# Patient Record
Sex: Male | Born: 1965 | Race: White | Hispanic: No | Marital: Single | Smoking: Former smoker
Health system: Southern US, Community
[De-identification: ages and names within clinical notes are randomized; demographics above are authoritative.]

## PROBLEM LIST (undated history)

## (undated) DIAGNOSIS — I2510 Atherosclerotic heart disease of native coronary artery without angina pectoris: Secondary | ICD-10-CM

## (undated) DIAGNOSIS — I10 Essential (primary) hypertension: Secondary | ICD-10-CM

## (undated) DIAGNOSIS — I509 Heart failure, unspecified: Secondary | ICD-10-CM

## (undated) DIAGNOSIS — G4730 Sleep apnea, unspecified: Secondary | ICD-10-CM

## (undated) DIAGNOSIS — K219 Gastro-esophageal reflux disease without esophagitis: Secondary | ICD-10-CM

## (undated) DIAGNOSIS — I252 Old myocardial infarction: Secondary | ICD-10-CM

## (undated) HISTORY — PX: SPLENECTOMY: SUR1306

## (undated) HISTORY — PX: PARTIAL NEPHRECTOMY: SHX414

## (undated) HISTORY — PX: CORONARY ANGIOPLASTY WITH STENT PLACEMENT: SHX49

---

## 2005-10-23 ENCOUNTER — Emergency Department: Payer: Self-pay | Admitting: Emergency Medicine

## 2007-01-04 ENCOUNTER — Emergency Department: Payer: Self-pay | Admitting: Emergency Medicine

## 2007-05-25 ENCOUNTER — Emergency Department: Payer: Self-pay | Admitting: Emergency Medicine

## 2007-07-27 ENCOUNTER — Emergency Department: Payer: Self-pay | Admitting: Emergency Medicine

## 2007-07-27 IMAGING — CR CERVICAL SPINE - 2-3 VIEW
1 series · 4 of 4 positions shown · non-contrast
Comparison: none

REASON FOR EXAM: Left neck pain radiating to left arm
COMMENTS:

PROCEDURE:     DXR - DXR C- SPINE AP AND LATERAL  - [DATE]  [DATE]
RESULT:     There is no evidence of fracture, dislocation or malalignment.
No evidence of prevertebral soft tissue swelling is appreciated.

[Series 1: view not recorded · 0.17mm/px · 4 of 4 slices shown]
[im 1/4]
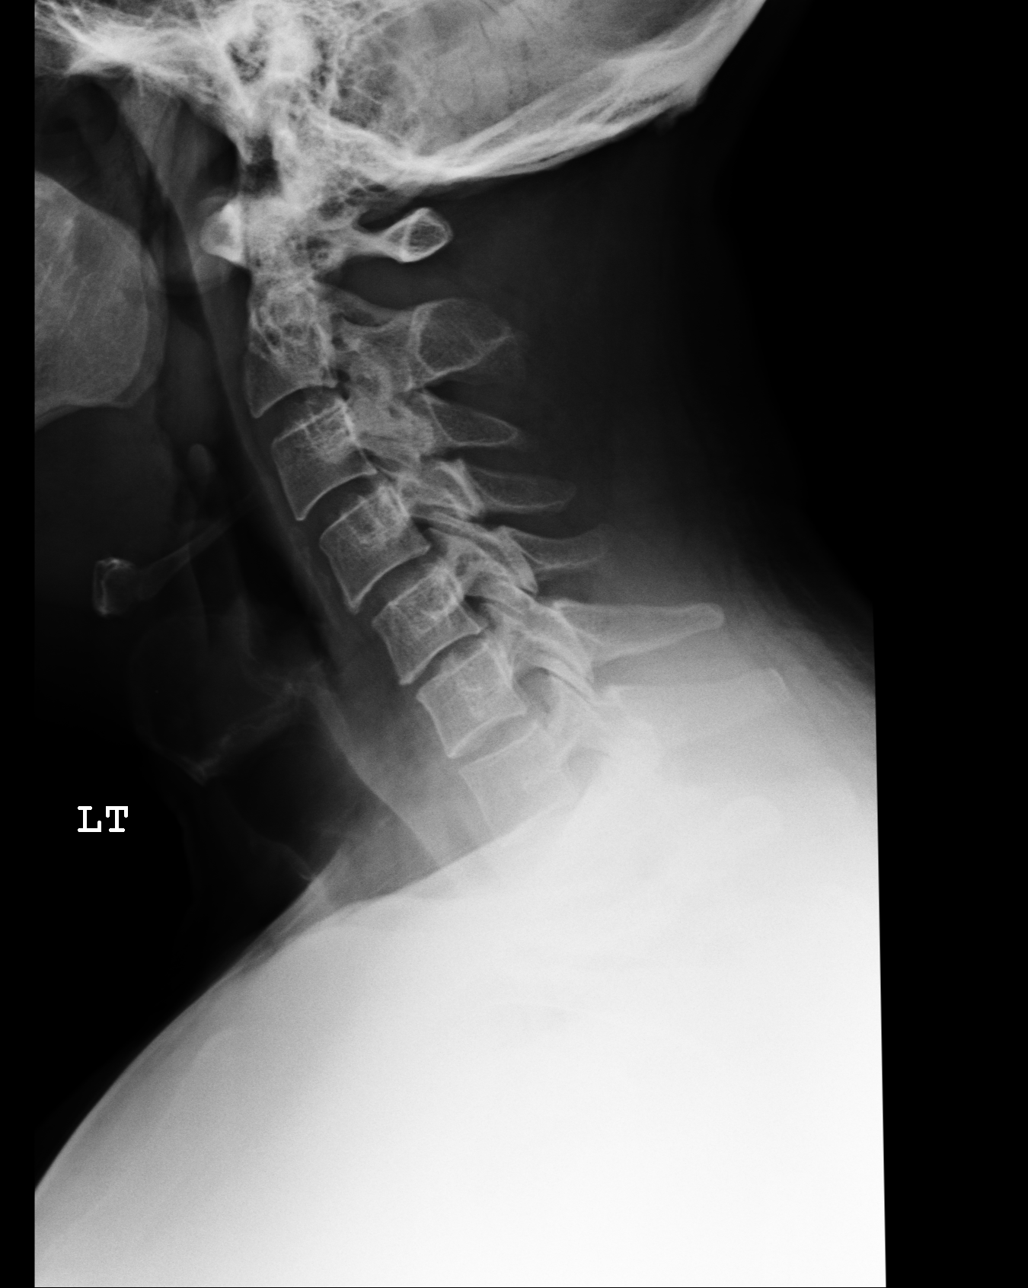
[im 2/4]
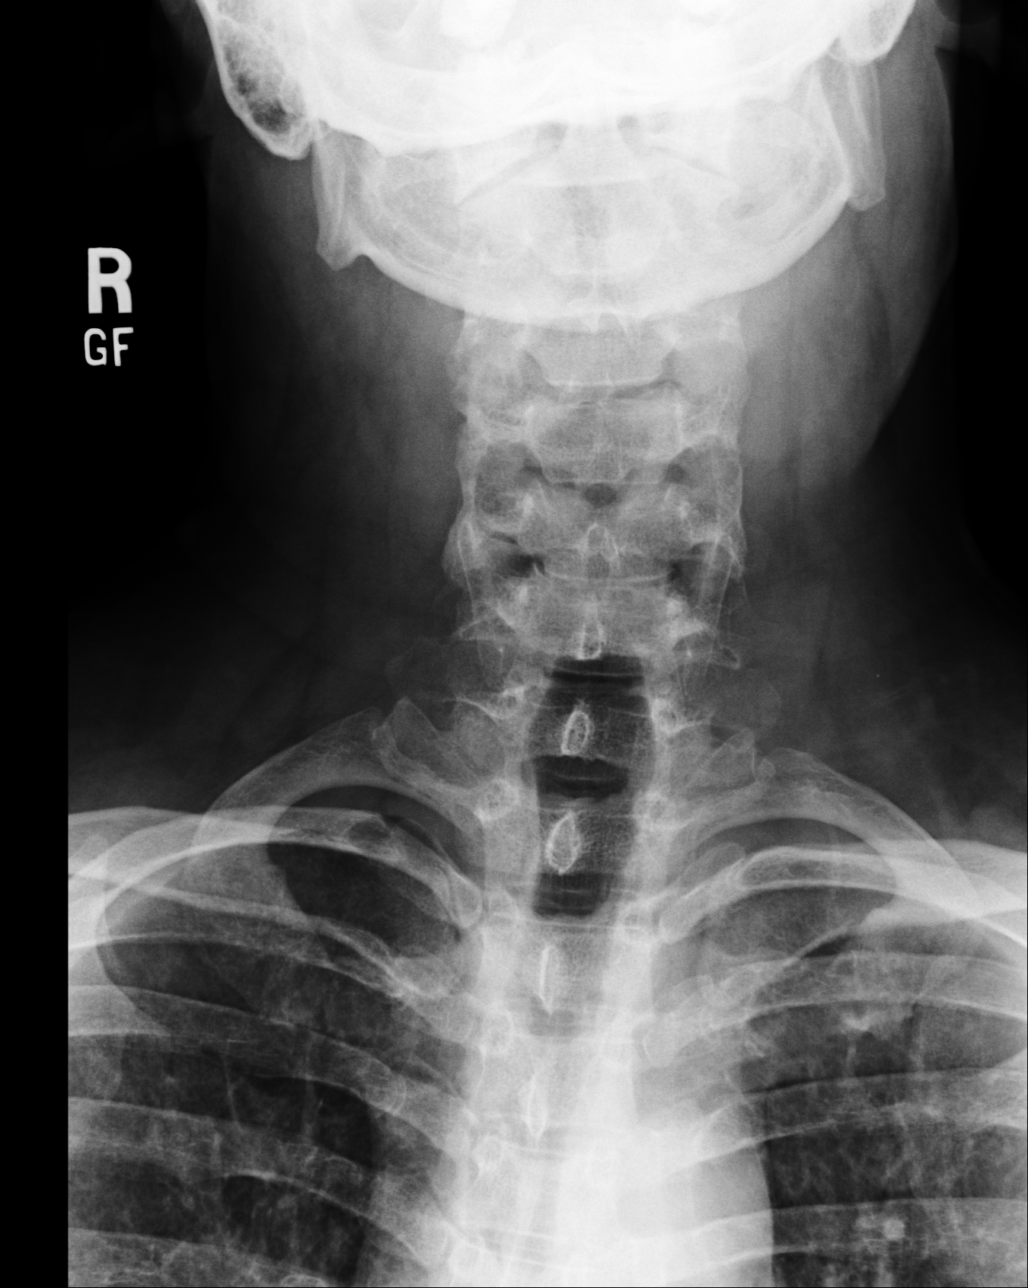
[im 3/4]
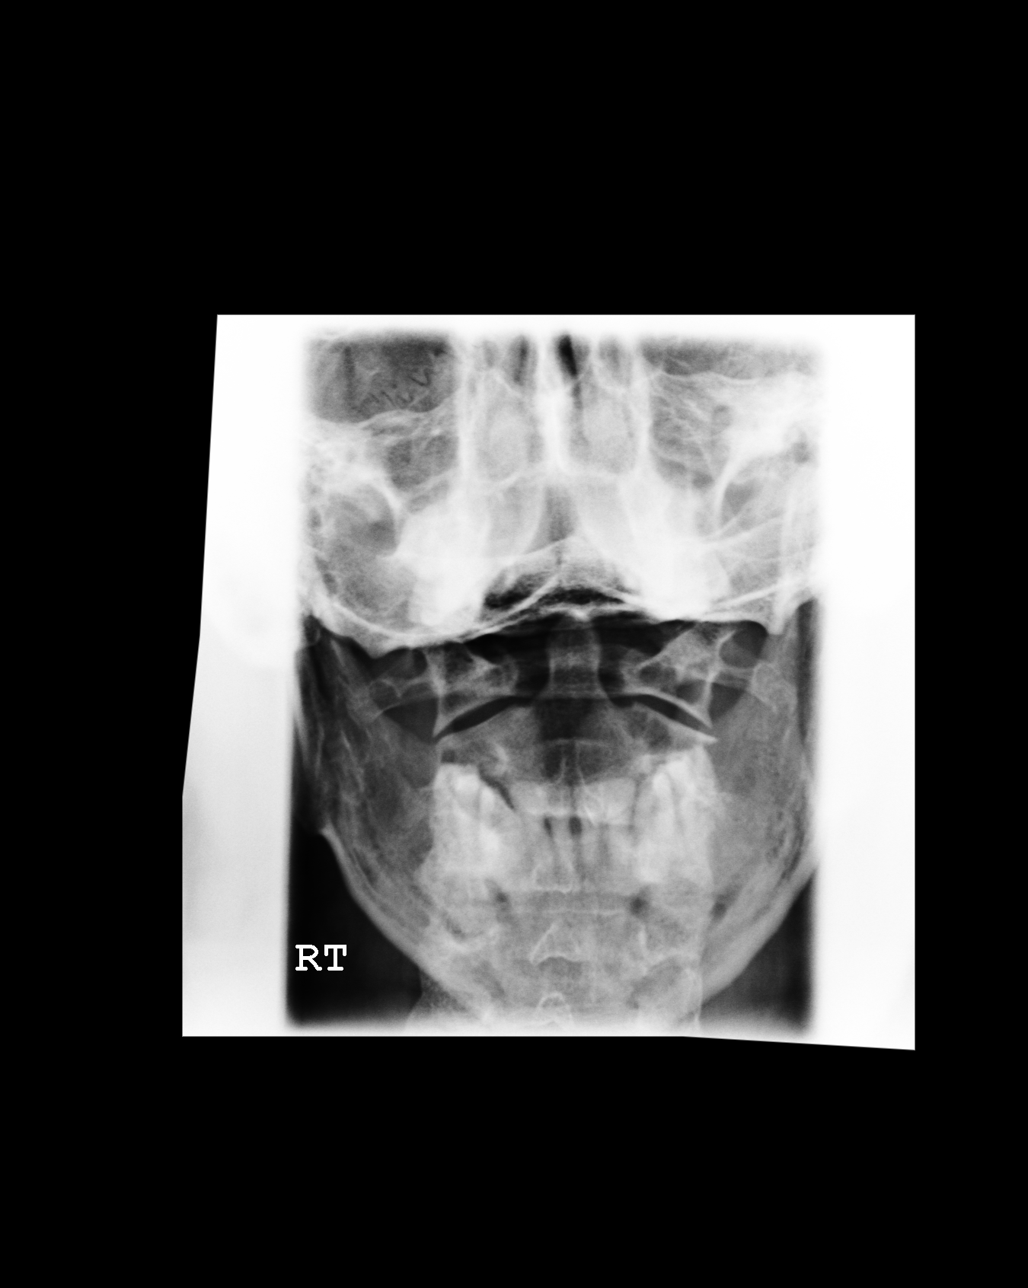
[im 4/4]
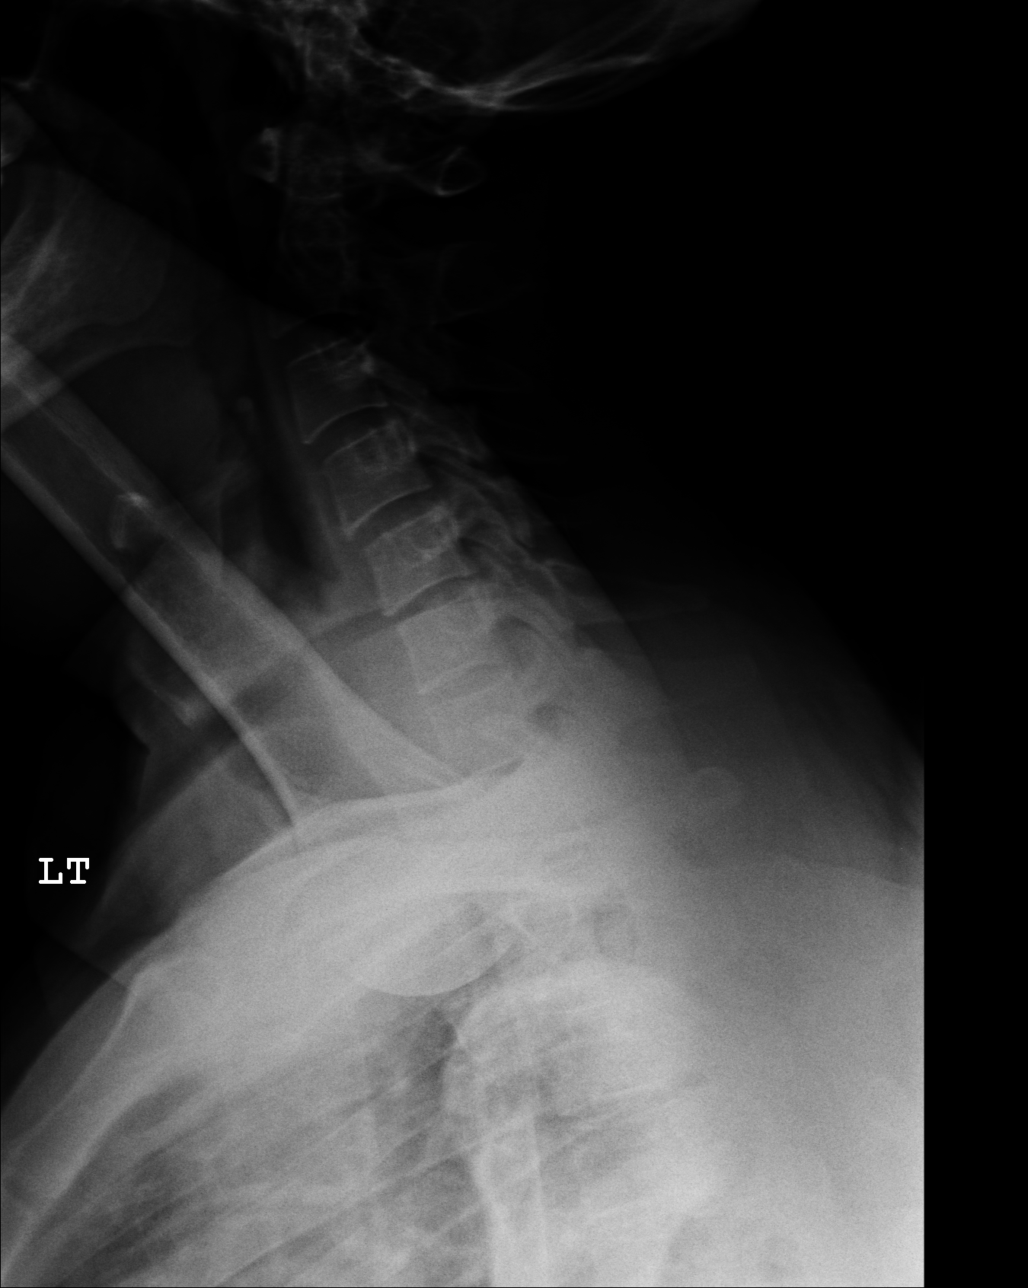

[4 of 4 positions shown; findings below may reference images not displayed]

IMPRESSION: 1.     No evidence of acute abnormalities of the cervical spine.
2.     If there is persistent clinical concern or persistent complaints of
pain, repeat evaluation in 7-10 days is recommended, or further evaluation
with CT or MRI, if clinically warranted.

## 2007-07-28 ENCOUNTER — Emergency Department: Payer: Self-pay | Admitting: Emergency Medicine

## 2008-05-31 ENCOUNTER — Emergency Department: Payer: Self-pay | Admitting: Emergency Medicine

## 2008-05-31 IMAGING — CR DG KNEE COMPLETE 4+V*R*
1 series · 4 of 4 positions shown · non-contrast
Comparison: none

REASON FOR EXAM: pain s/p fall
COMMENTS:

PROCEDURE:     DXR - DXR KNEE RT COMP WITH OBLIQUES  - [DATE]  [DATE]
RESULT:     No fracture, dislocation or other acute bony abnormality is
identified. The knee joint space is well-maintained. The patella is intact.

[Series 1: view not recorded · 0.17mm/px · 4 of 4 slices shown]
[im 1/4]
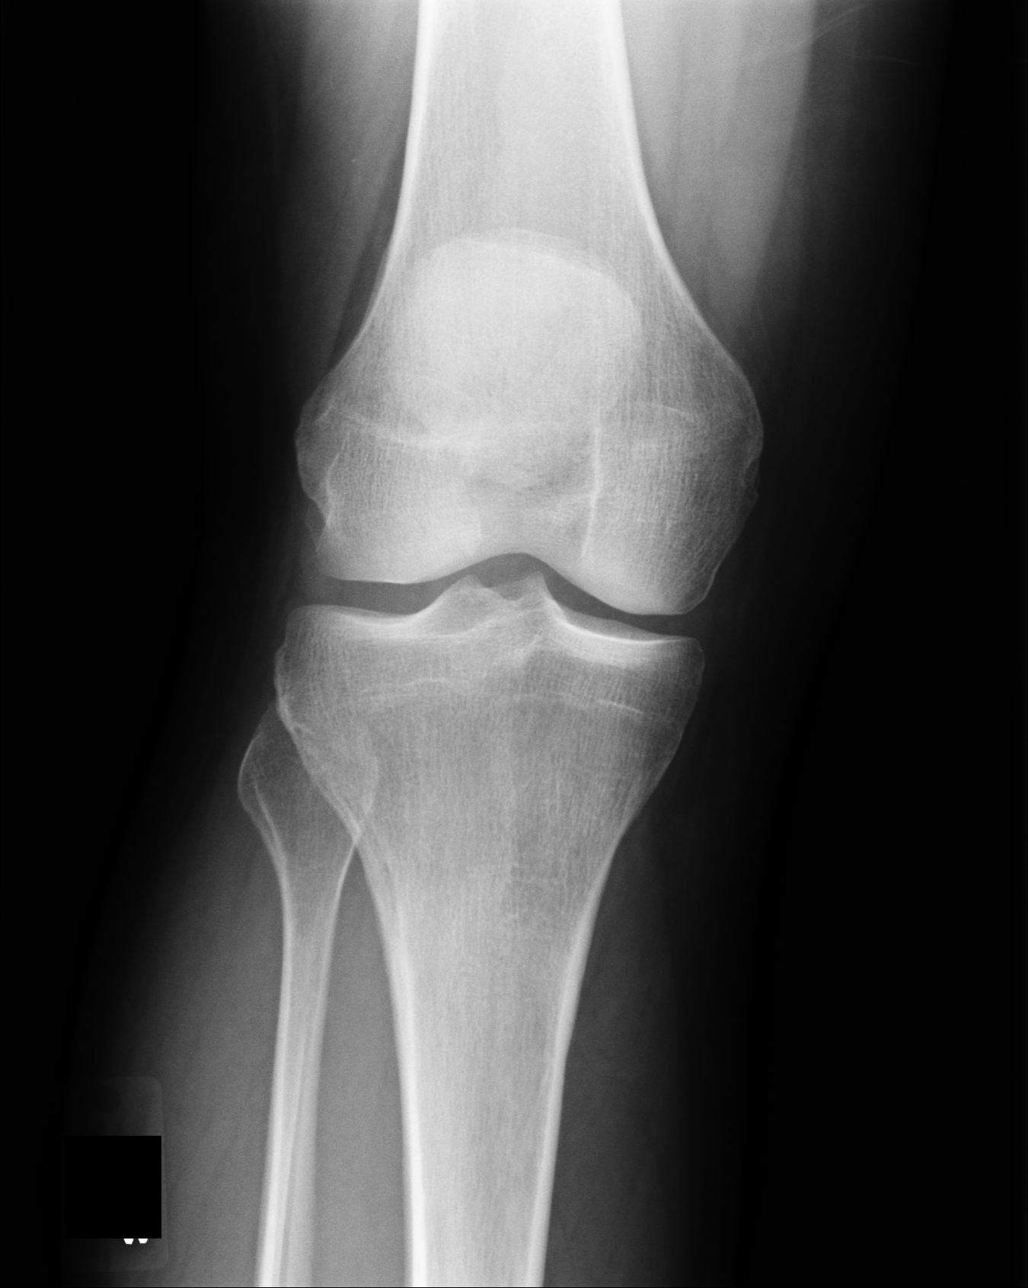
[im 2/4]
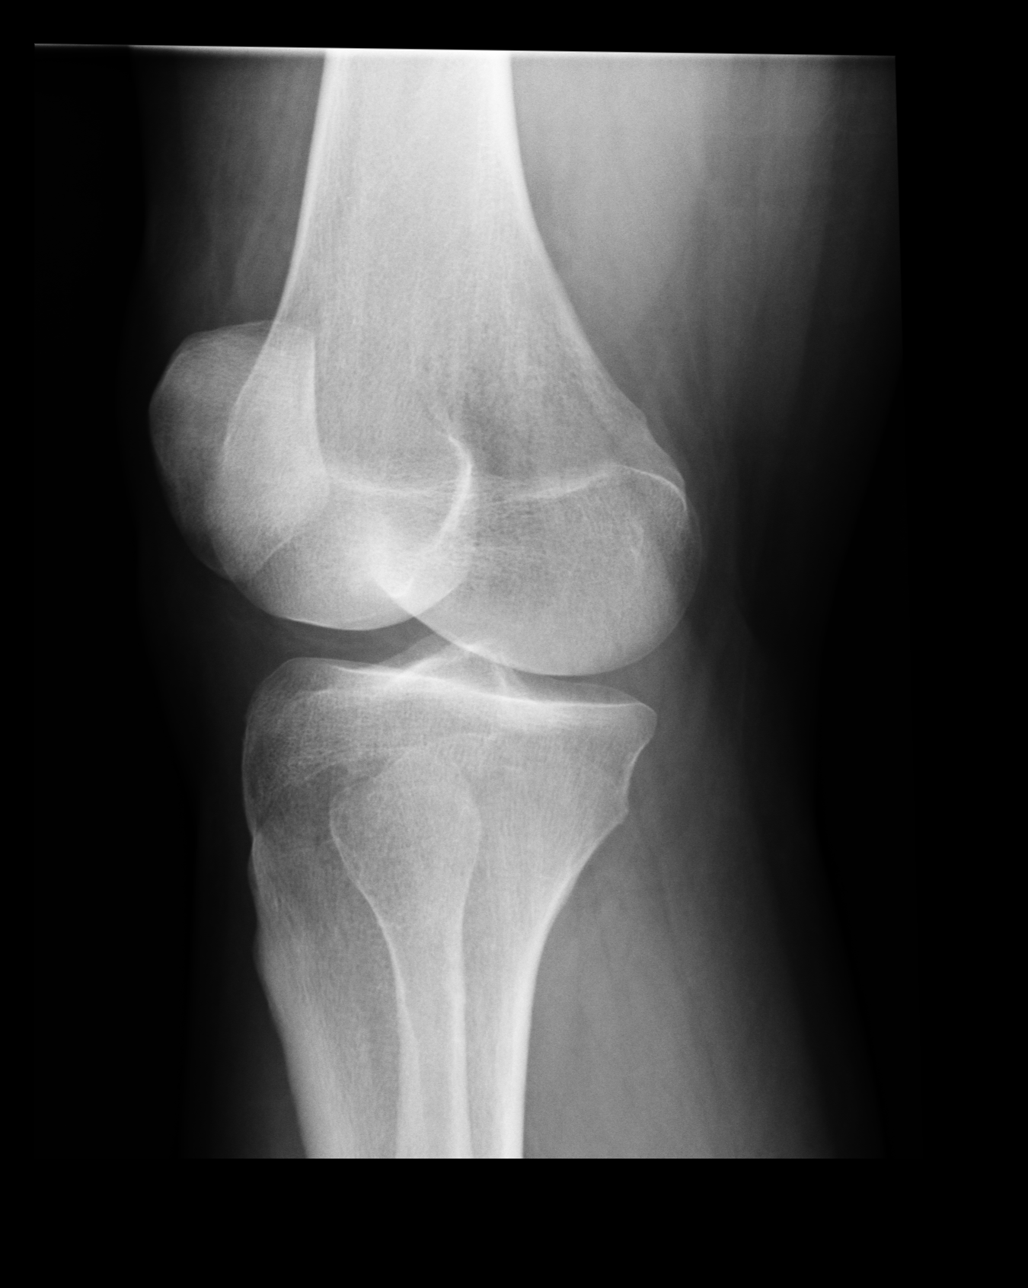
[im 3/4]
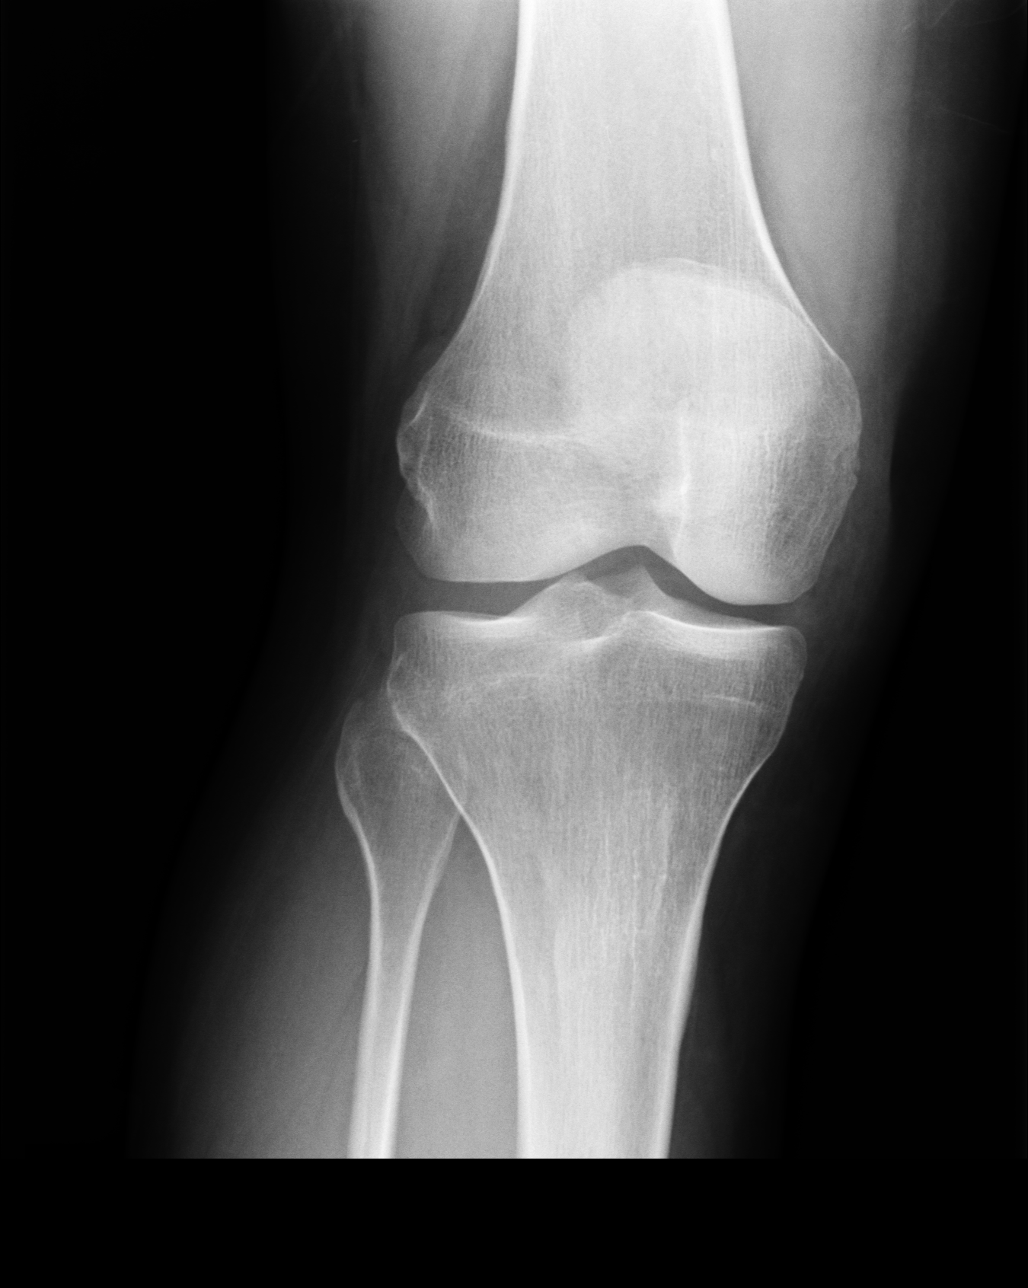
[im 4/4]
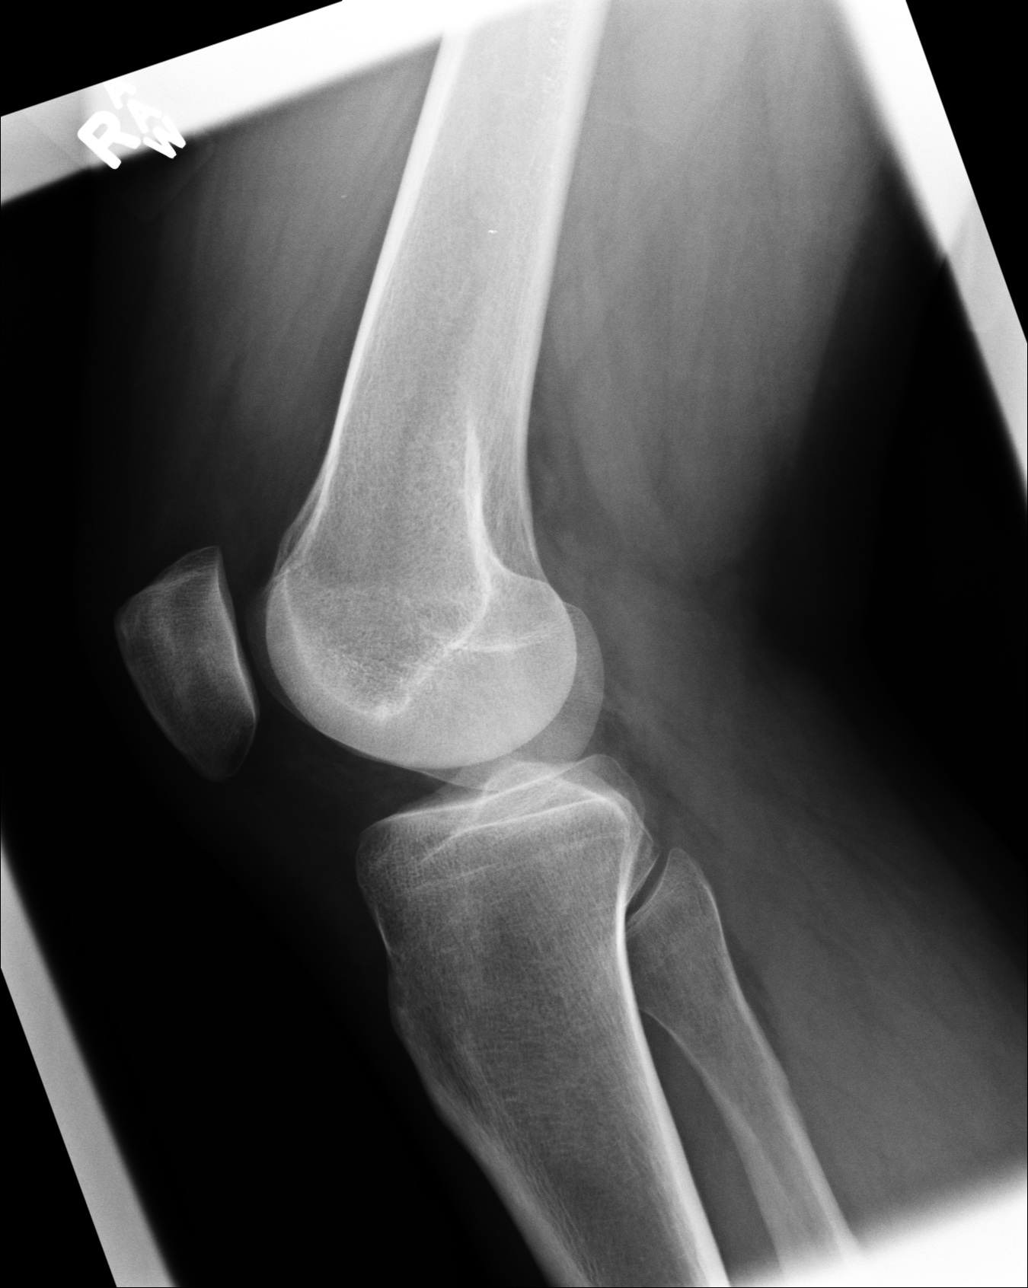

[4 of 4 positions shown; findings below may reference images not displayed]

IMPRESSION: No acute changes are identified.

## 2008-12-18 ENCOUNTER — Emergency Department: Payer: Self-pay | Admitting: Emergency Medicine

## 2009-01-17 ENCOUNTER — Emergency Department: Payer: Self-pay | Admitting: Emergency Medicine

## 2009-03-28 ENCOUNTER — Emergency Department: Payer: Self-pay | Admitting: Emergency Medicine

## 2009-03-29 IMAGING — CR DG CHEST 2V
1 series · 2 of 2 positions shown · non-contrast
Comparison: none

REASON FOR EXAM: cough productive with shortness of breath
COMMENTS:   LMP: (Male)

[Series 1: view not recorded · 0.17mm/px · 2 of 2 slices shown]
[im 1/2]
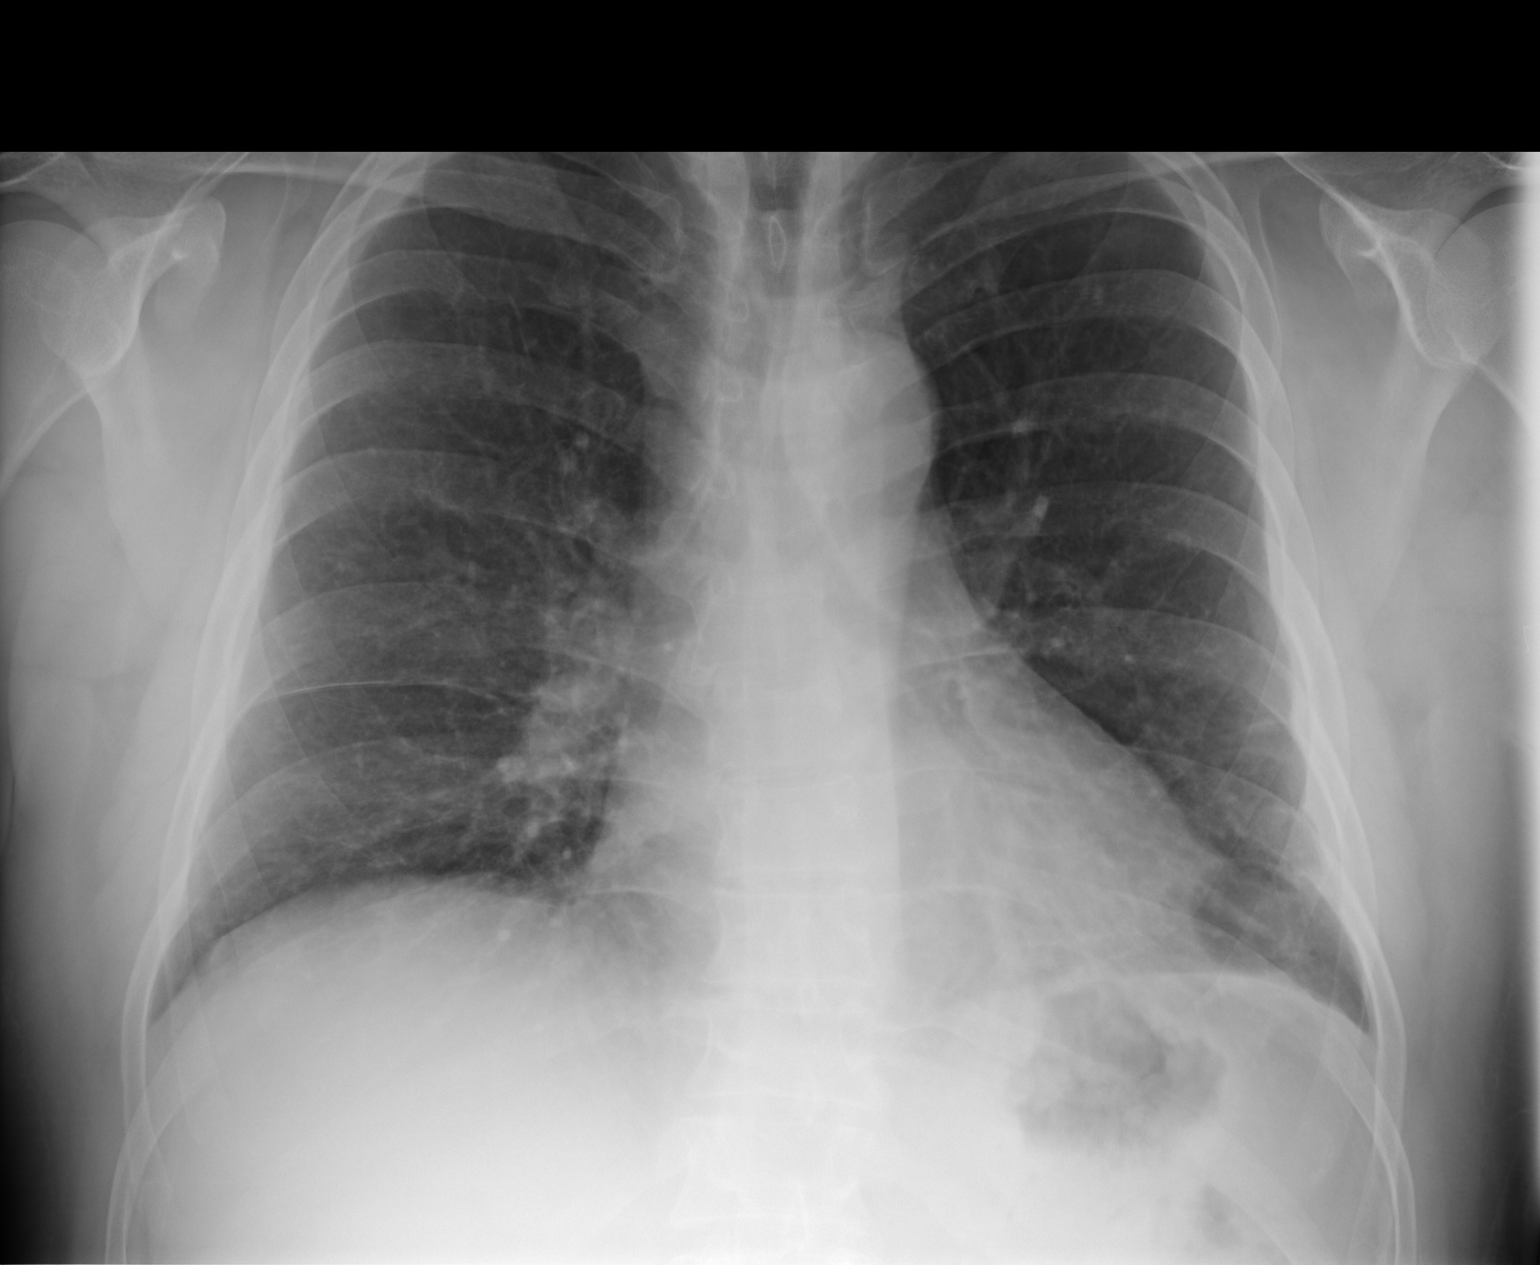
[im 2/2]
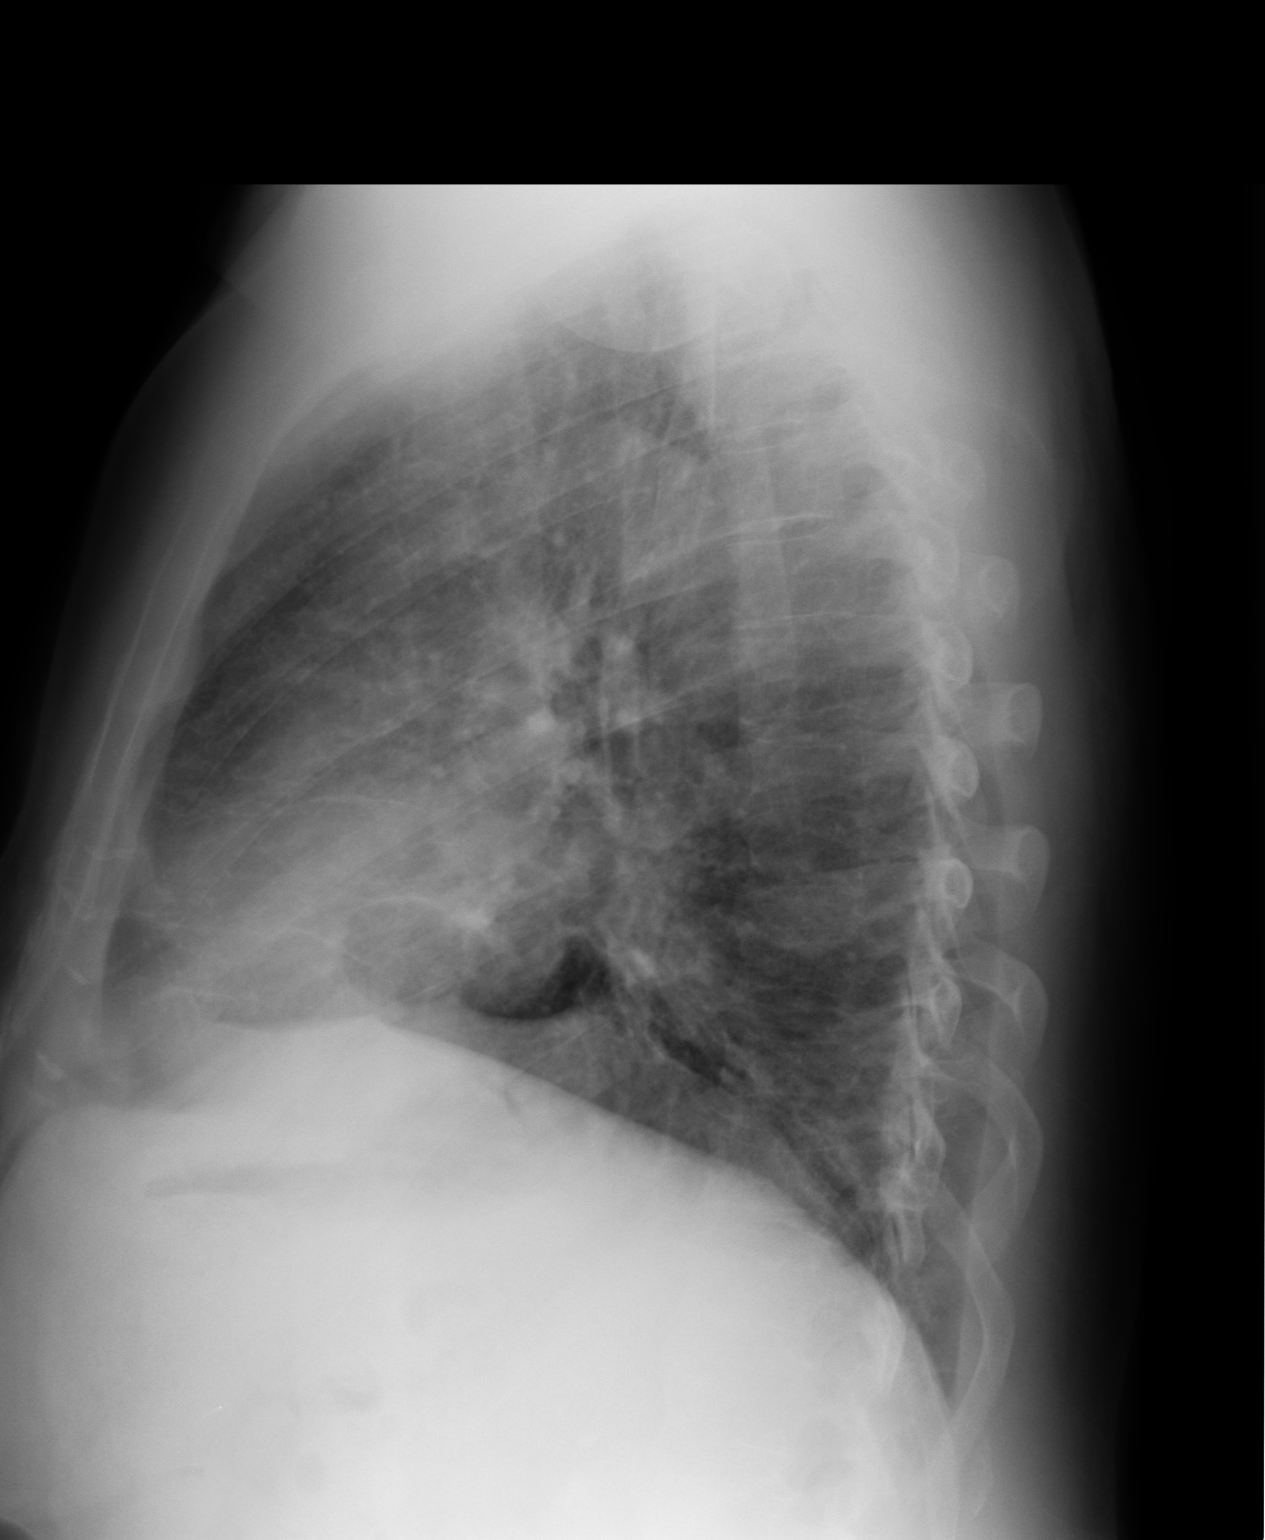

[2 of 2 positions shown; findings below may reference images not displayed]

PROCEDURE:     DXR - DXR CHEST PA (OR AP) AND LATERAL  - [DATE]  [DATE]

RESULT:       The patient has taken a shallow inspiration.

There is prominence of the interstitial markings. Areas of mild increased
density project within the right and left lung bases.  No focal region of
consolidation is appreciated. The cardiac silhouette is mildly enlarged. The
visualized bony skeleton is unremarkable.
IMPRESSION: 1. Atelectasis versus infiltrate within the lung bases.
2. Interstitial infiltrate edematous versus nonedematous as described above.
This is mild.

## 2009-05-05 ENCOUNTER — Emergency Department: Payer: Self-pay | Admitting: Emergency Medicine

## 2009-05-18 ENCOUNTER — Inpatient Hospital Stay: Payer: Self-pay | Admitting: Student

## 2009-05-18 IMAGING — CT CT CHEST W/ CM
2 series · 16 of 31 positions shown, 20 images · IV contrast (APPLIED)
Comparison: none

REASON FOR EXAM: chest pain  hypertensive   + d dimer
COMMENTS:

PROCEDURE:     CT  - CT CHEST (FOR PE) W  - [DATE]  [DATE]
RESULT:
HISTORY: Shortness of breath.

[Series 4: soft tissue · axial · 0.75mm/px · z∈[-393,-297]mm · 3 of 107 slices shown]
[im 9/107  mediastinal]
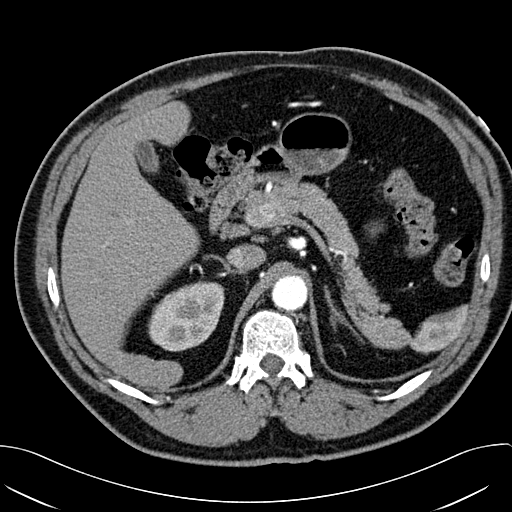
[im 25/107  mediastinal]
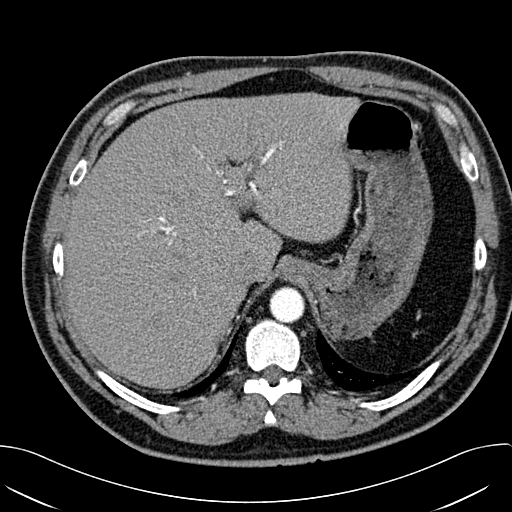
[im 41/107  mediastinal]
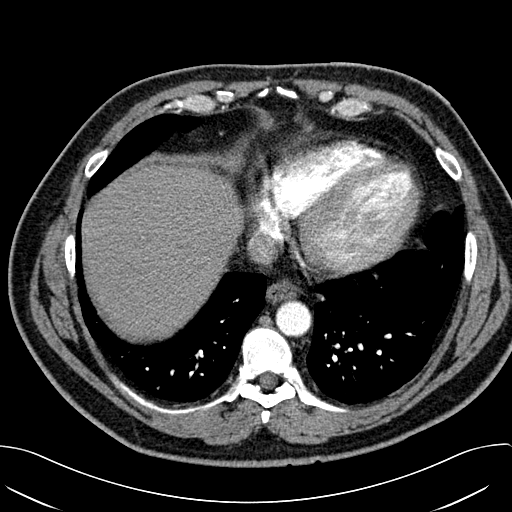

[Series 5: lung windows · axial · 0.75mm/px · z∈[-387,-123]mm · 13 of 105 slices shown, 17 images]
[im 9/105  mediastinal]
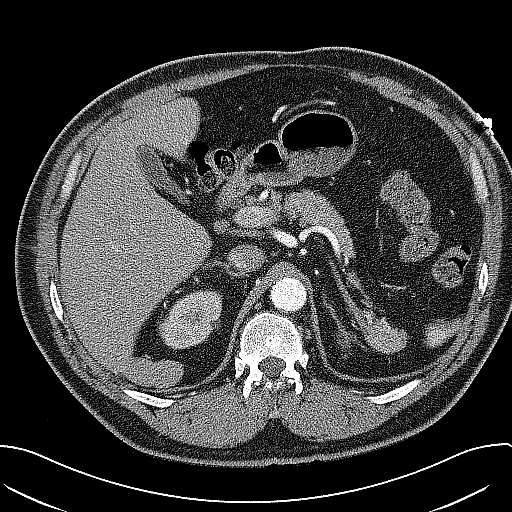
[im 9/105  lung]
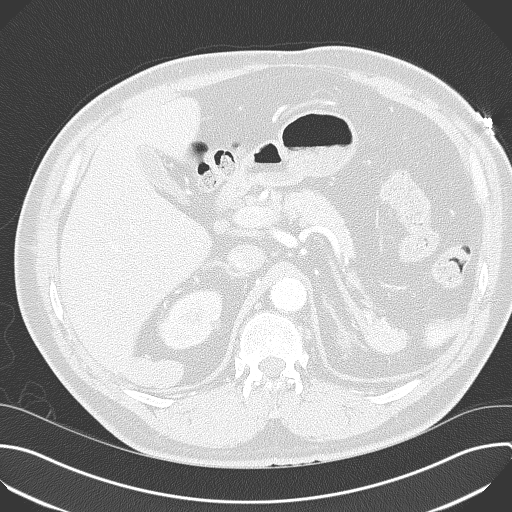
[im 17/105  lung]
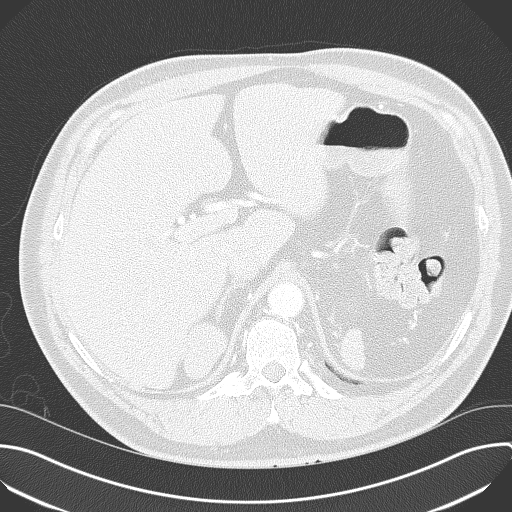
[im 25/105  lung]
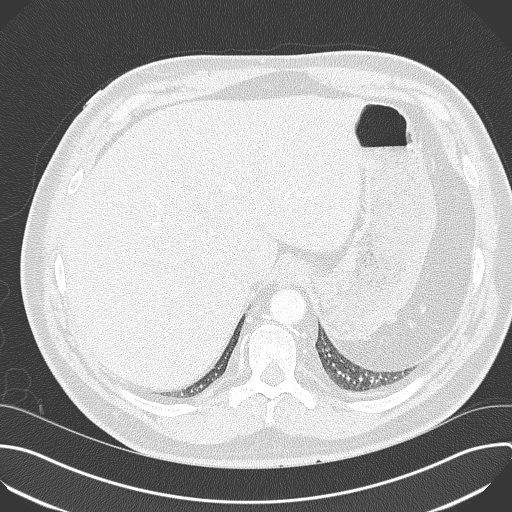
[im 33/105  lung]
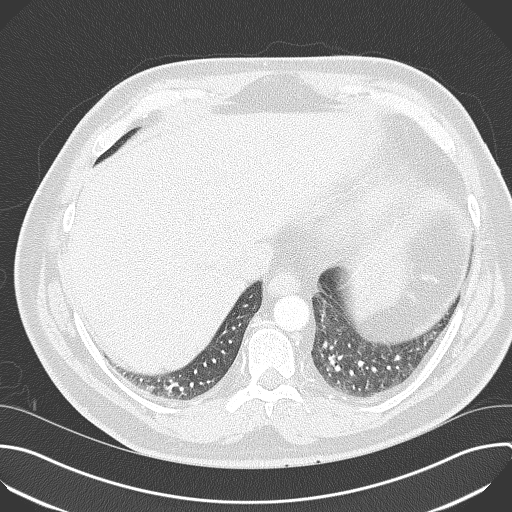
[im 41/105  mediastinal]
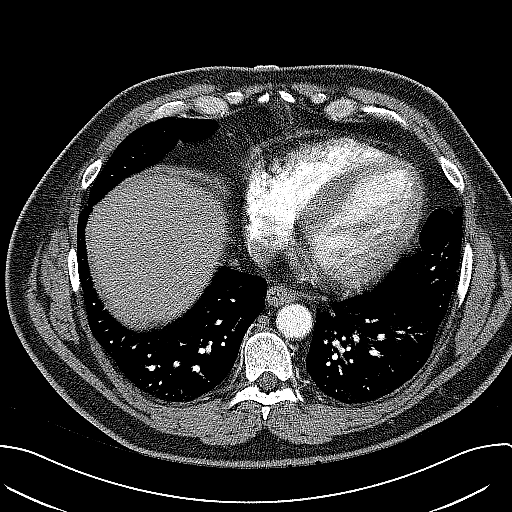
[im 41/105  lung]
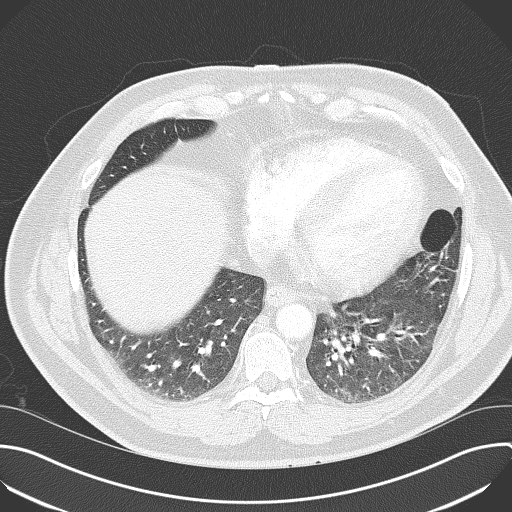
[im 49/105  lung]
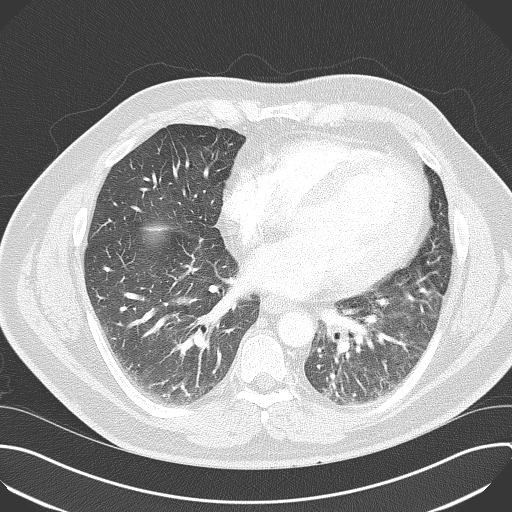
[im 53/105  lung]
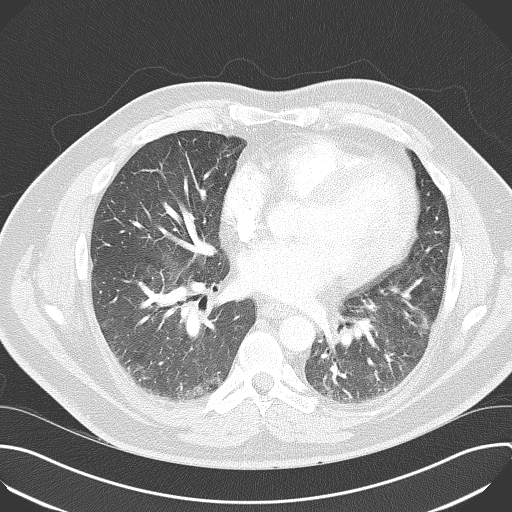
[im 57/105  lung]
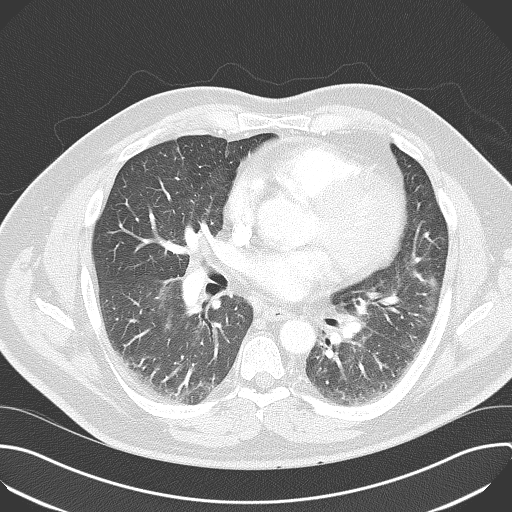
[im 65/105  mediastinal]
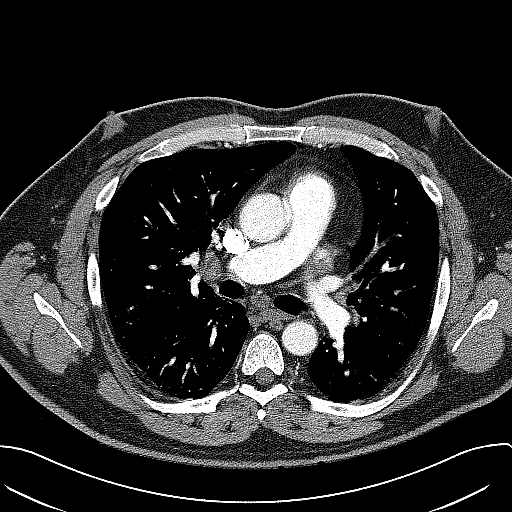
[im 65/105  lung]
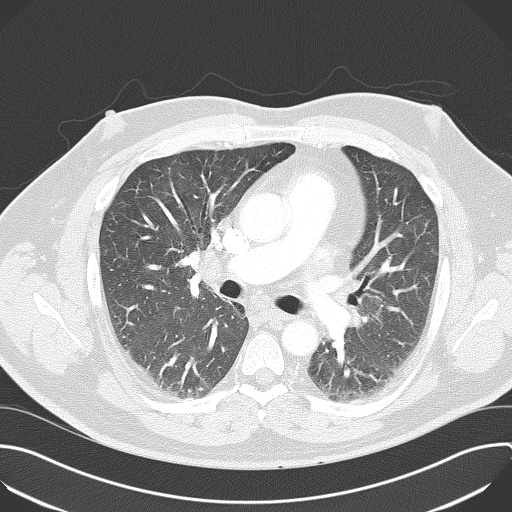
[im 73/105  lung]
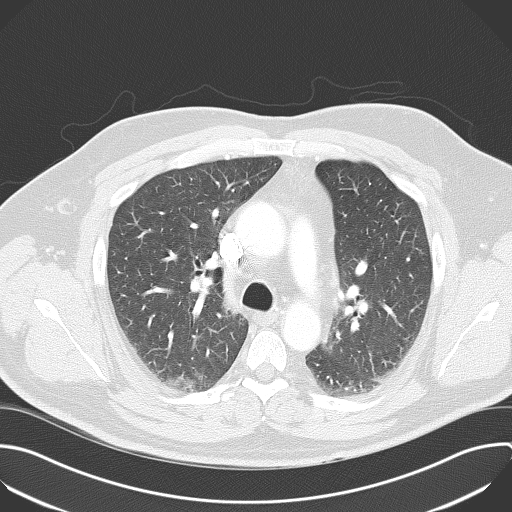
[im 81/105  lung]
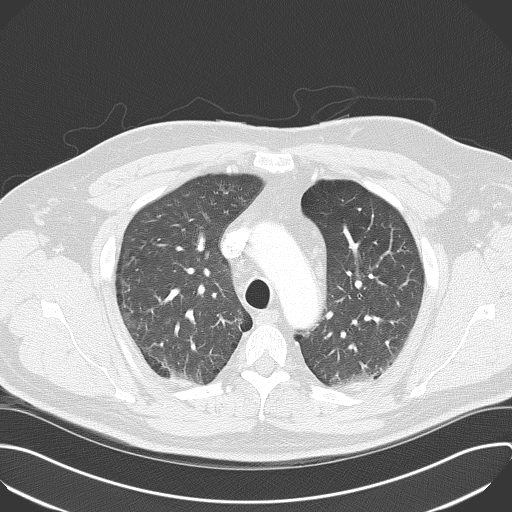
[im 89/105  lung]
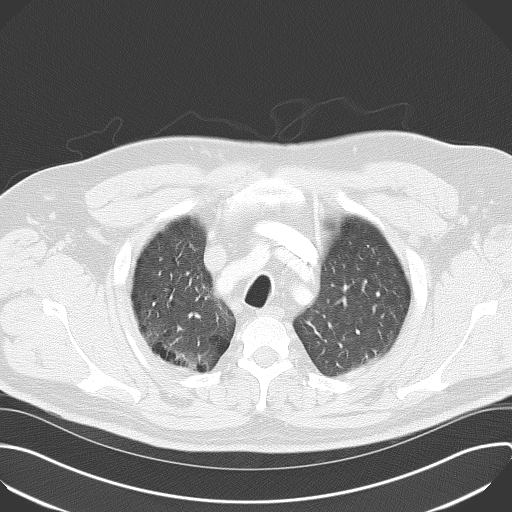
[im 97/105  mediastinal]
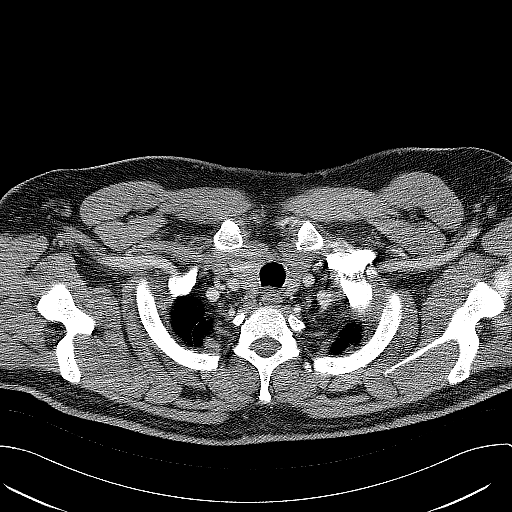
[im 97/105  lung]
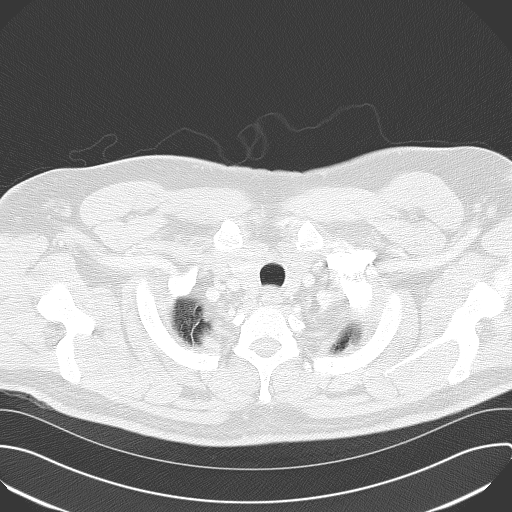

[16 of 31 positions shown; findings below may reference images not displayed]

COMPARISON STUDY: Chest x-ray of [DATE].

PROCEDURE AND FINDINGS: Following administration of 100 cc of [5I], CT
was obtained. Substernal thyroid gland is noted. The thoracic aorta is
normal. The pulmonary arteries are normal. Changes consistent with COPD are
noted. No free air noted. Splenosis is noted. The patient has had a prior
splenectomy.
IMPRESSION: No evidence of pulmonary embolus.

## 2009-05-18 IMAGING — CR DG CHEST 1V PORT
1 series · 1 of 1 positions shown · non-contrast
Comparison: none

REASON FOR EXAM: Chest Pain
COMMENTS:

[view not recorded]
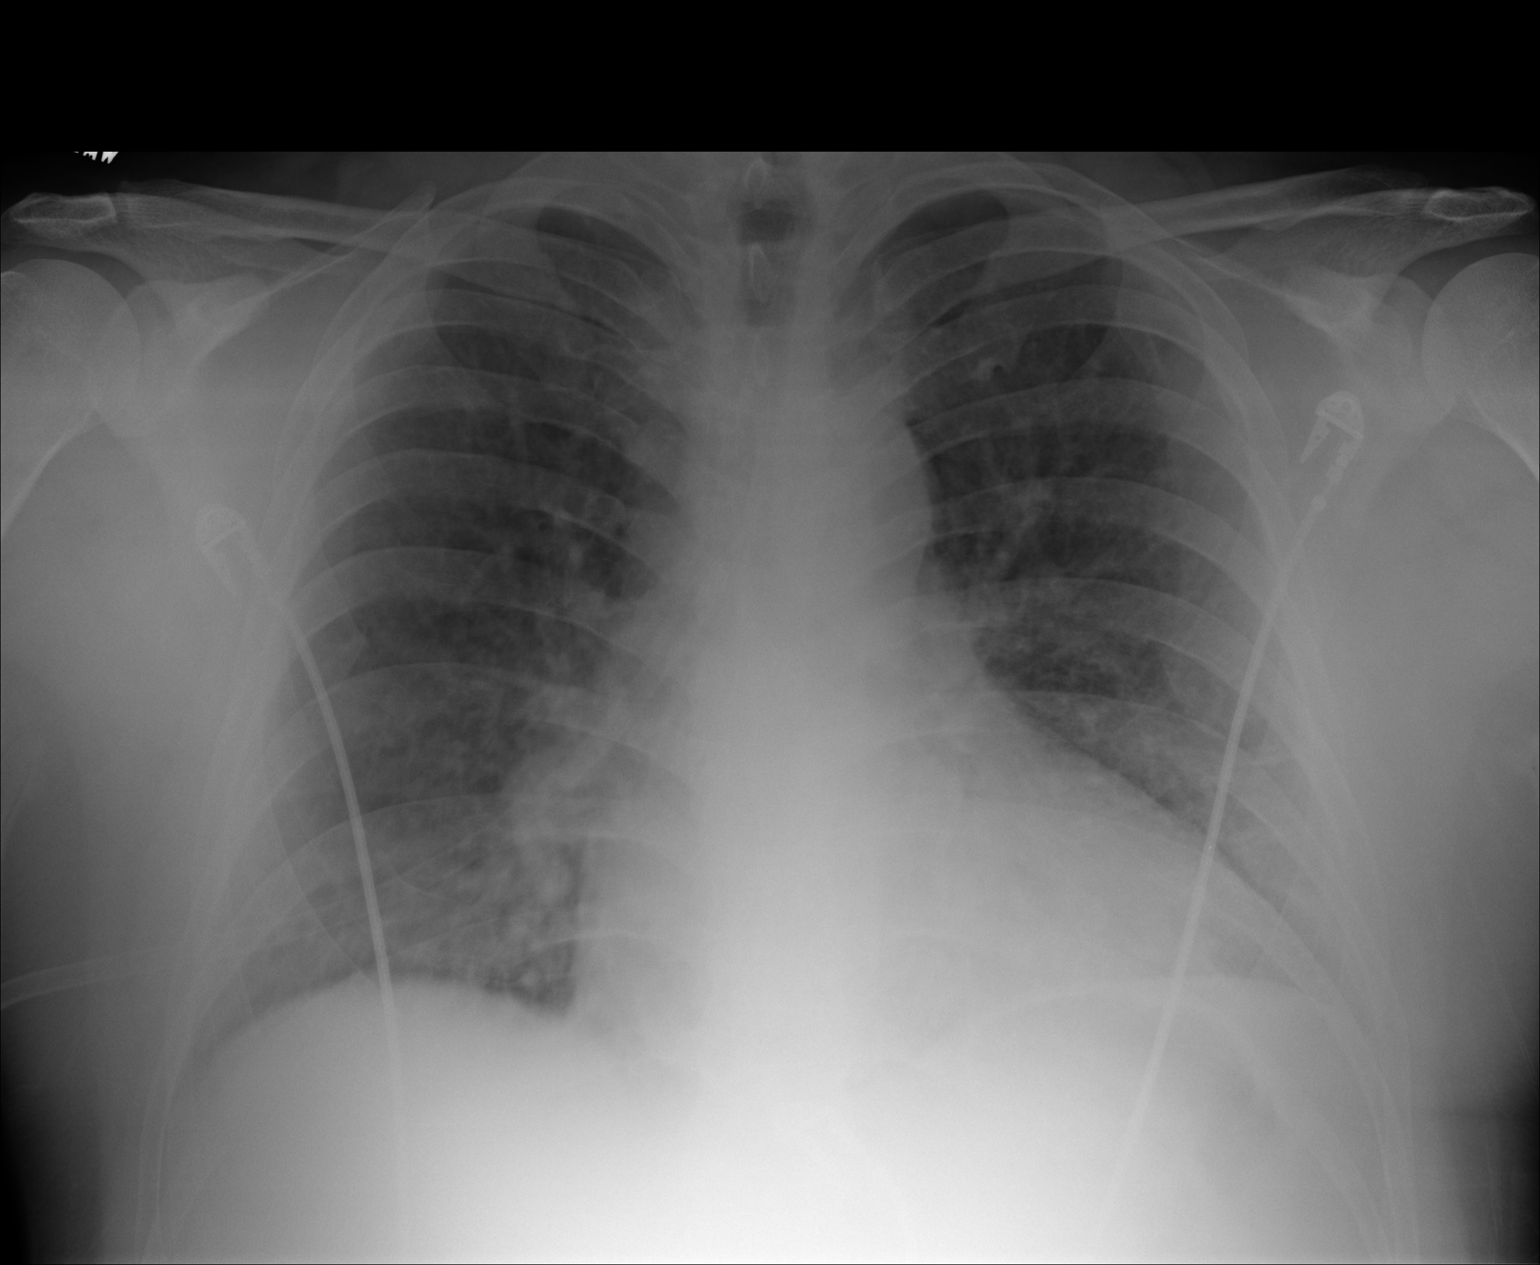

[1 of 1 positions shown; findings below may reference images not displayed]

PROCEDURE:     DXR - DXR PORTABLE CHEST SINGLE VIEW  - [DATE] [DATE]

RESULT:     Comparison is made to study [DATE].

The cardiac silhouette is enlarged. The pulmonary vascularity is prominent
centrally. The pulmonary interstitial markings are mildly increased. There
is no alveolar infiltrate or pleural effusion.
IMPRESSION: The findings are consistent with low-grade CHF or other
process resulting in mild interstitial edema and mild enlargement of the
cardiac silhouette. A followup PA and lateral chest x-ray would be of value.

## 2009-05-25 ENCOUNTER — Emergency Department: Payer: Self-pay | Admitting: Emergency Medicine

## 2009-06-04 ENCOUNTER — Emergency Department: Payer: Self-pay | Admitting: Emergency Medicine

## 2009-06-04 IMAGING — US US EXTREM LOW VENOUS*R*
1 series · 17 of 24 positions shown · non-contrast
Comparison: none

REASON FOR EXAM: pain, swelling disoloration to right foot and lower
extremity
COMMENTS:   LMP: (Male)

PROCEDURE:     US  - US DOPPLER LOW EXTR RIGHT  - [DATE]  [DATE]
RESULT:     The right femoral and popliteal veins are normally compressible.
The waveform patterns are normal and the color flow images are normal. The
response to the augmentation maneuver is normal.

[Series 1: us extrem low venous*right* · 17 of 26 slices shown]
[im 1/26]
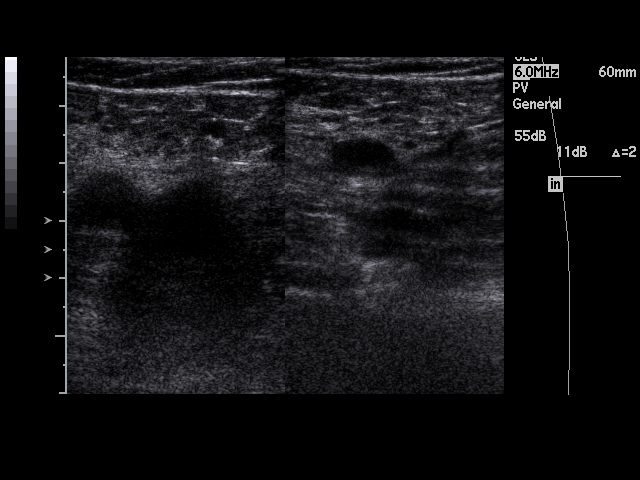
[im 3/26]
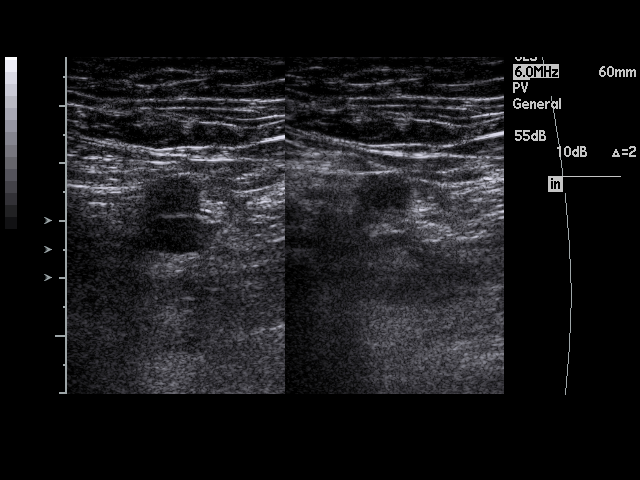
[im 4/26]
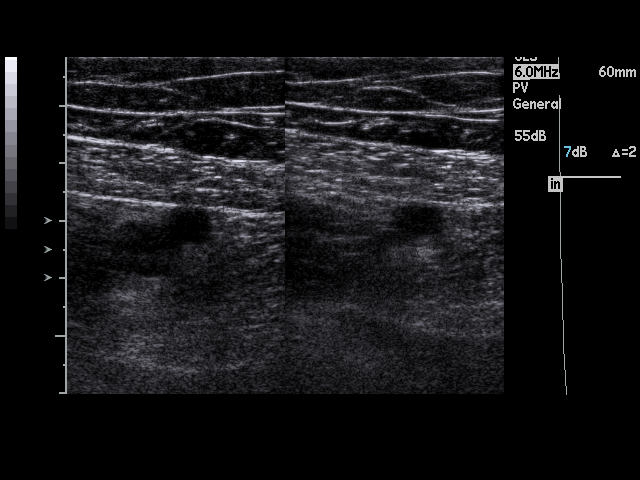
[im 5/26]
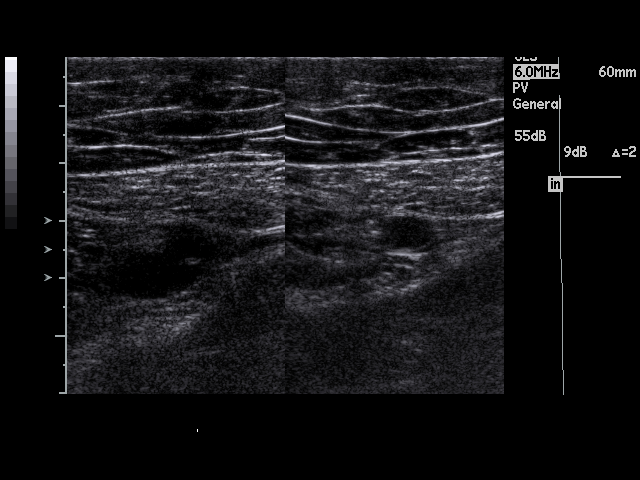
[im 7/26]
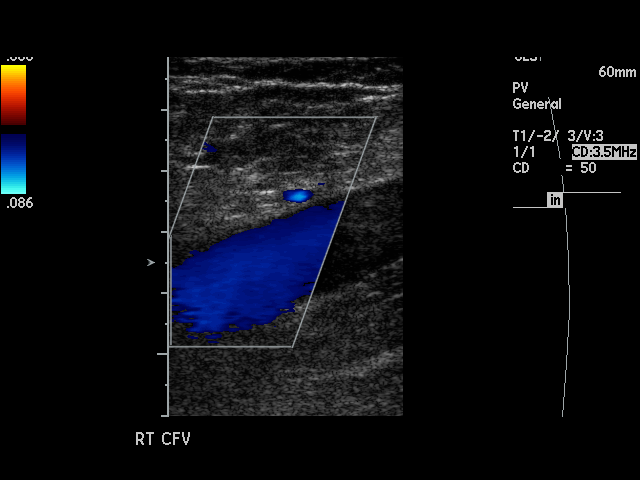
[im 8/26]
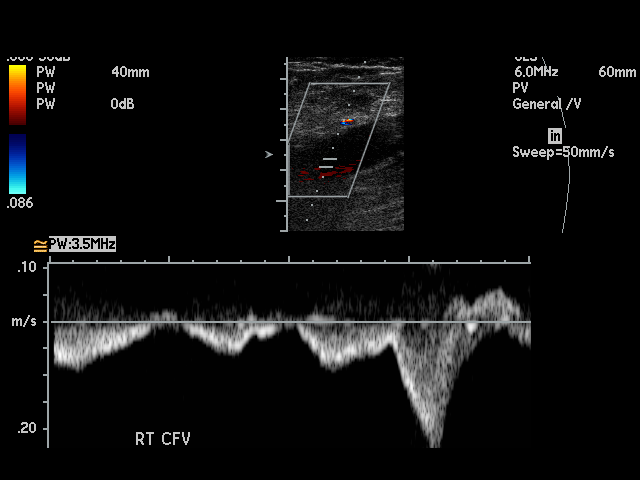
[im 10/26]
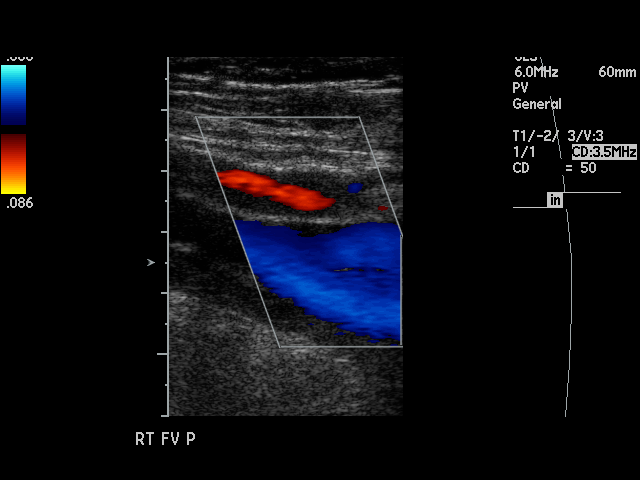
[im 11/26]
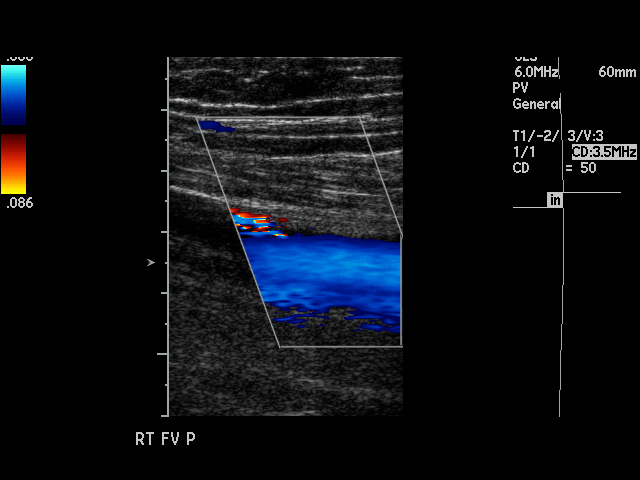
[im 14/26]
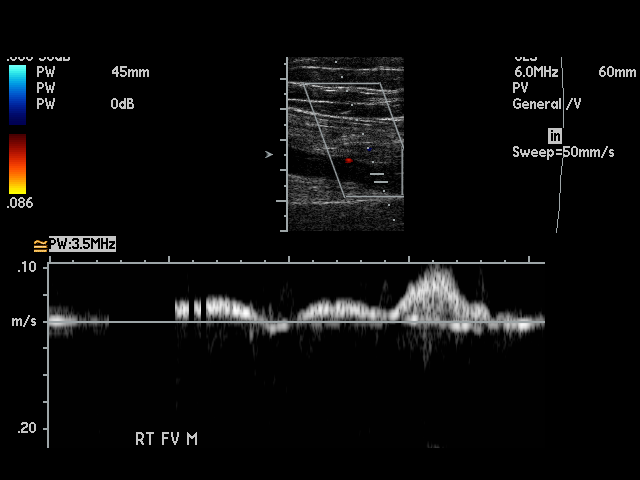
[im 15/26]
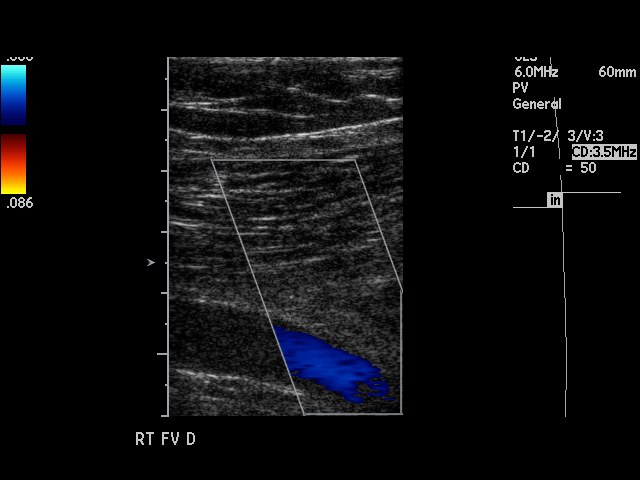
[im 16/26]
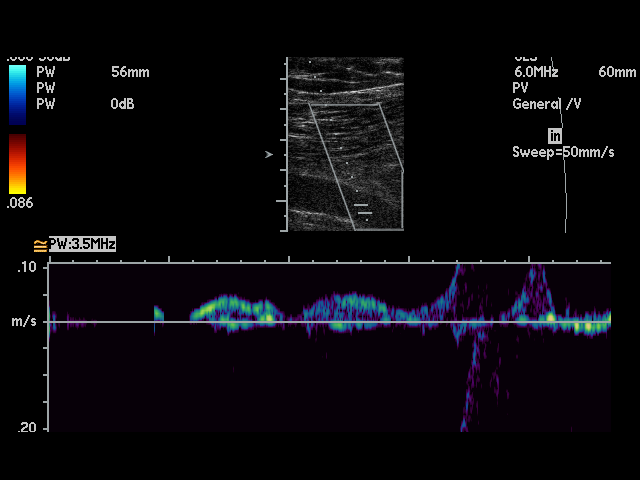
[im 18/26]
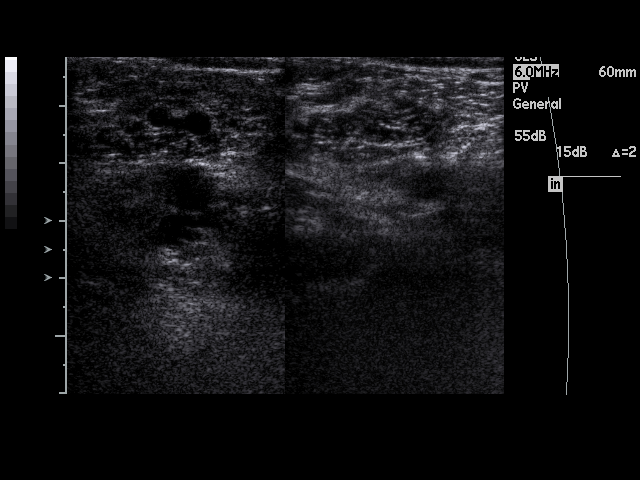
[im 19/26]
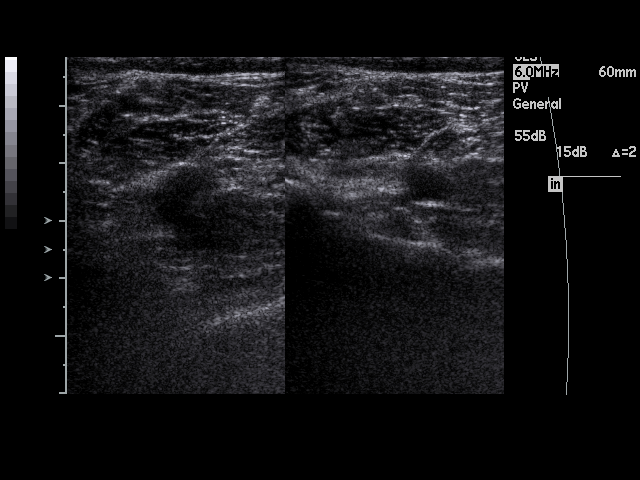
[im 21/26]
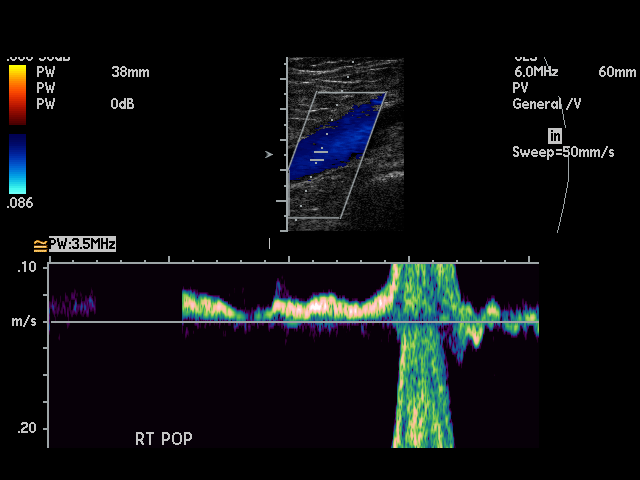
[im 22/26]
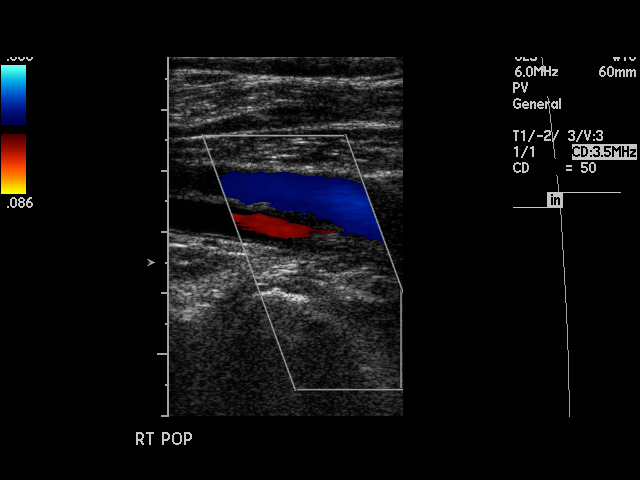
[im 23/26]
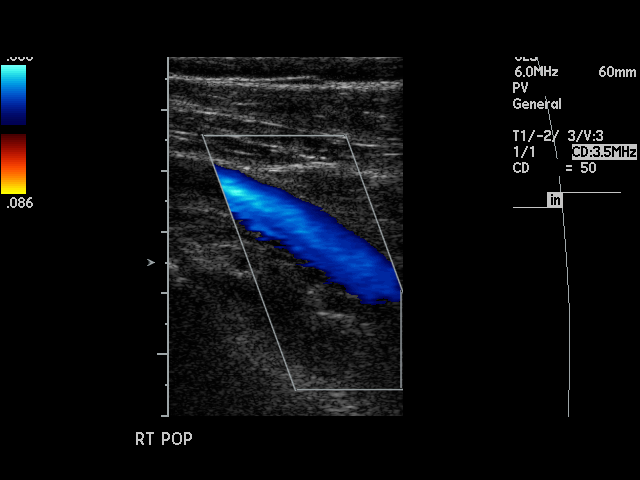
[im 26/26]
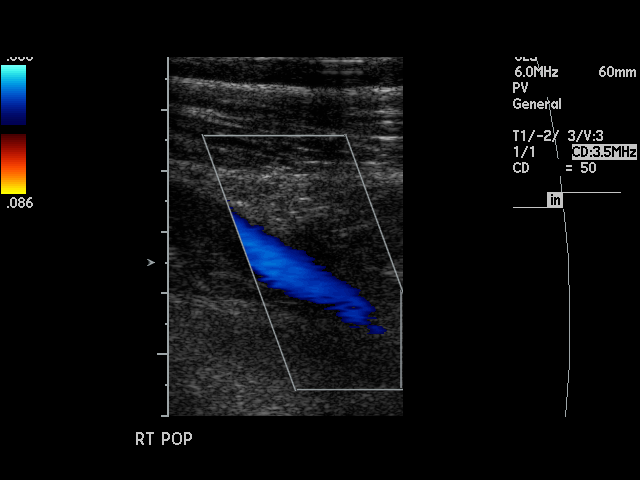

[17 of 24 positions shown; findings below may reference images not displayed]

IMPRESSION: I do not see evidence of thrombus within the right femoral
or popliteal veins.

## 2009-06-04 IMAGING — CR DG CHEST 2V
1 series · 2 of 2 positions shown · non-contrast
Comparison: none

REASON FOR EXAM: cough, productive of phlegm sinus congestion
COMMENTS:   LMP: (Male)

[Series 1: view not recorded · 0.17mm/px · 2 of 2 slices shown]
[im 1/2]
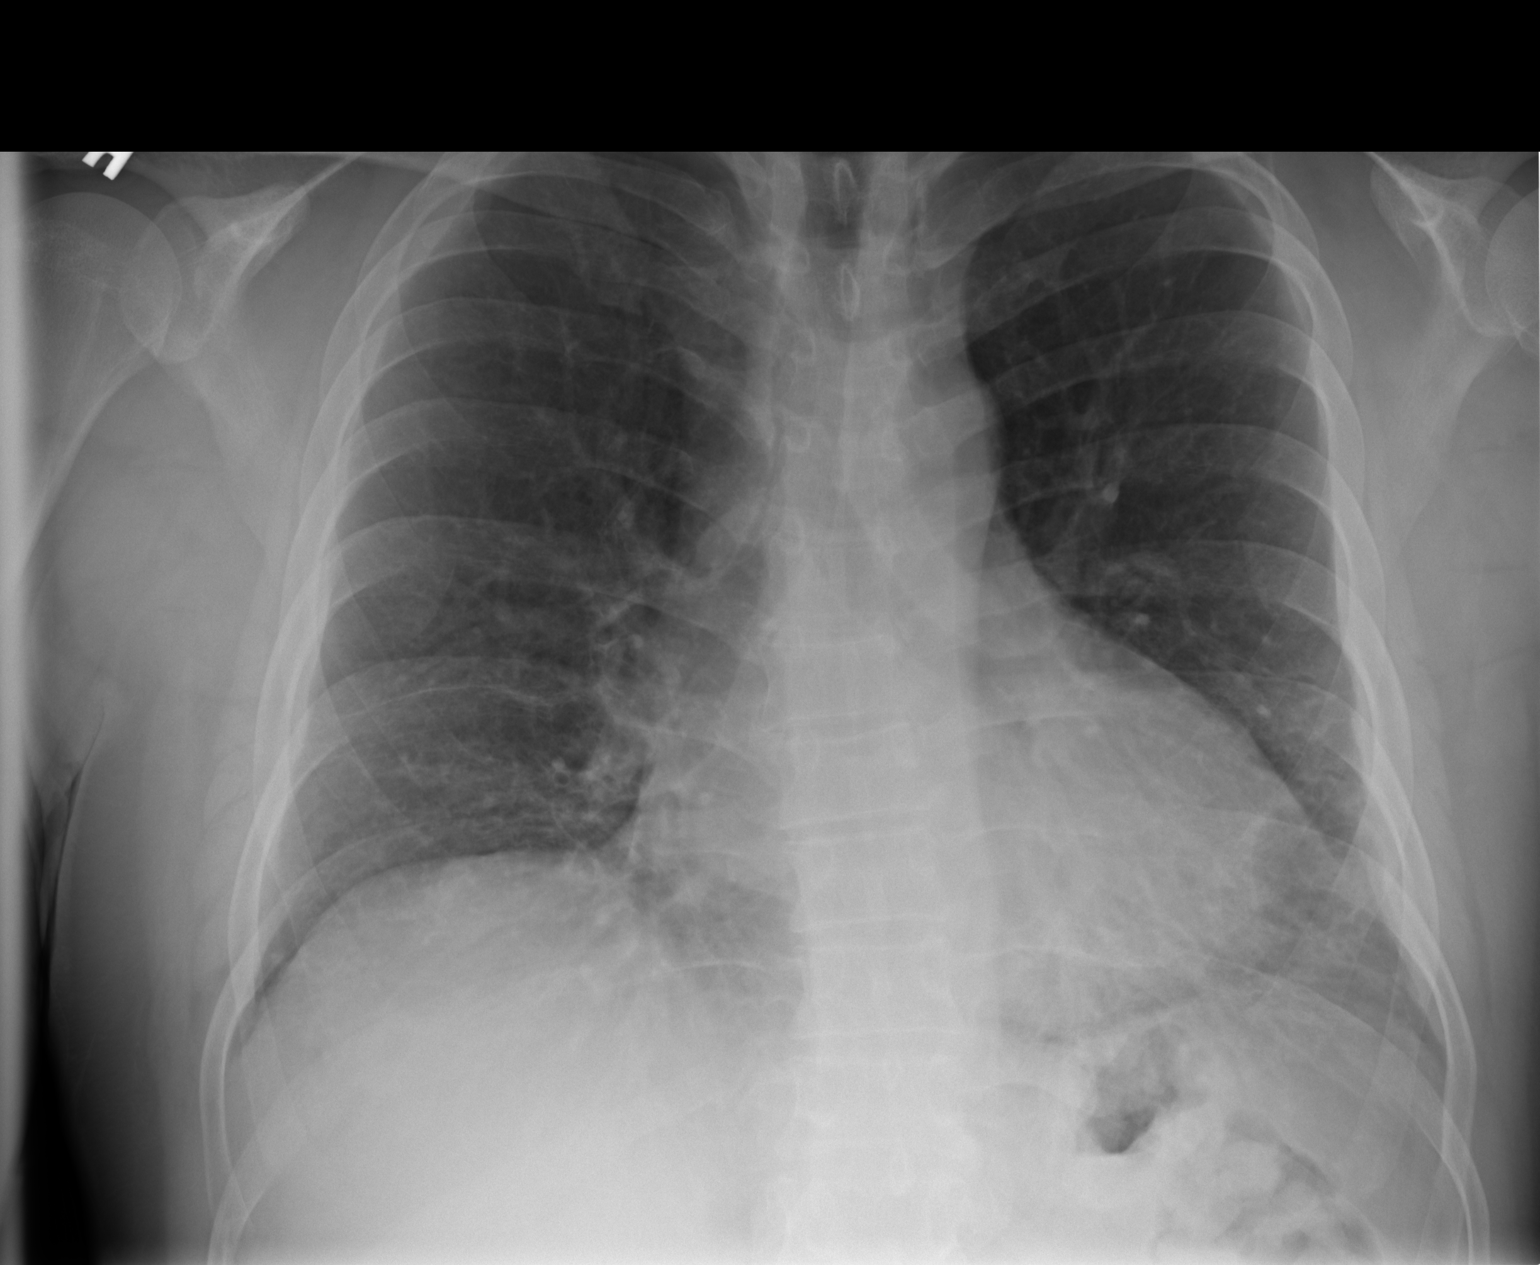
[im 2/2]
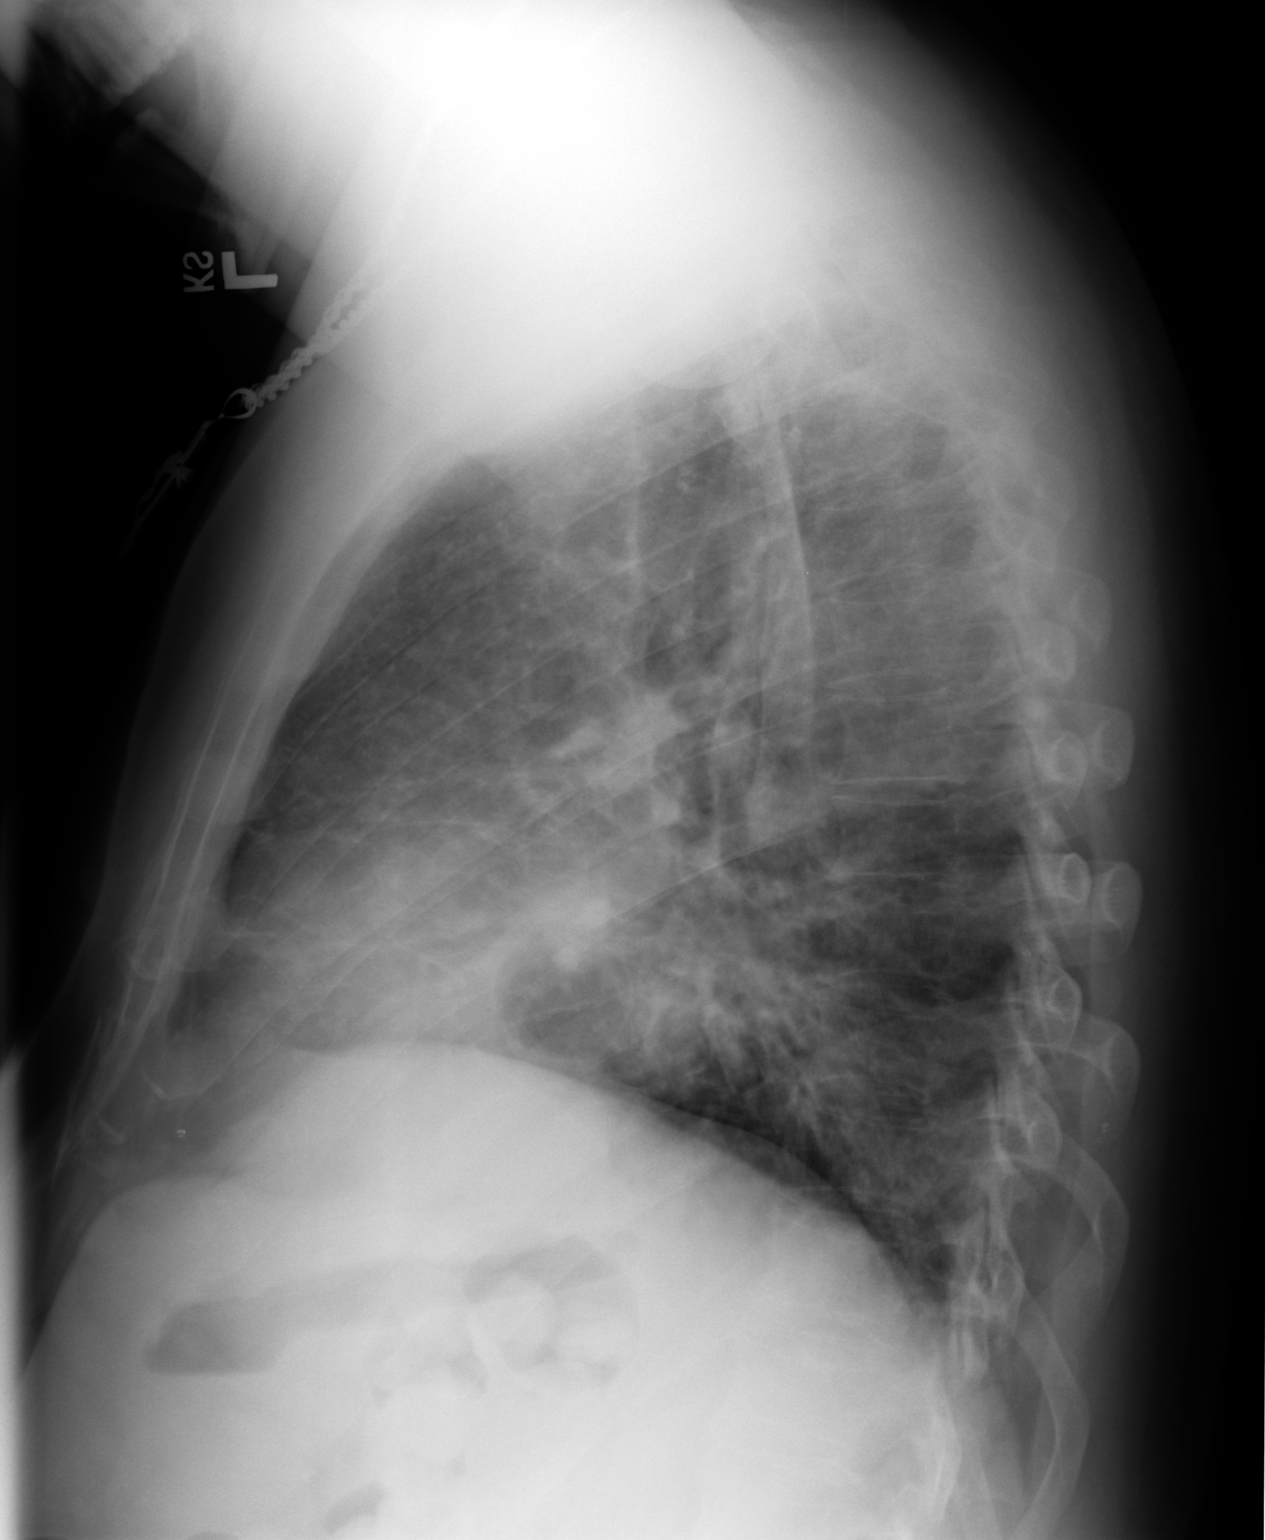

[2 of 2 positions shown; findings below may reference images not displayed]

PROCEDURE:     DXR - DXR CHEST PA (OR AP) AND LATERAL  - [DATE]  [DATE]

RESULT:     Comparison is made to study [DATE].

The lungs are adequately inflated. The cardiac silhouette is top normal in
size but stable. The pulmonary vascularity is not engorged. The interstitial
markings are increased at the lung bases. There is no pleural effusion.
IMPRESSION: Increased interstitial density is noted bilaterally
especially in the left lower lobe. The findings suggest subsegmental
atelectasis or early interstitial pneumonia. Followup films following
therapy would be of value to assure complete clearing.

## 2009-06-24 ENCOUNTER — Emergency Department: Payer: Self-pay | Admitting: Emergency Medicine

## 2009-07-26 ENCOUNTER — Emergency Department: Payer: Self-pay | Admitting: Emergency Medicine

## 2009-10-28 ENCOUNTER — Emergency Department: Payer: Self-pay | Admitting: Emergency Medicine

## 2009-10-28 IMAGING — CR DG CHEST 2V
1 series · 2 of 2 positions shown · non-contrast
Comparison: none

REASON FOR EXAM: cough
COMMENTS:

PROCEDURE:     DXR - DXR CHEST PA (OR AP) AND LATERAL  - [DATE]  [DATE]
RESULT:     Mild bibasilar atelectasis is present. The lungs are otherwise
clear. Cardiovascular structures are unremarkable.

[Series 1: view not recorded · 0.17mm/px · 2 of 2 slices shown]
[im 1/2]
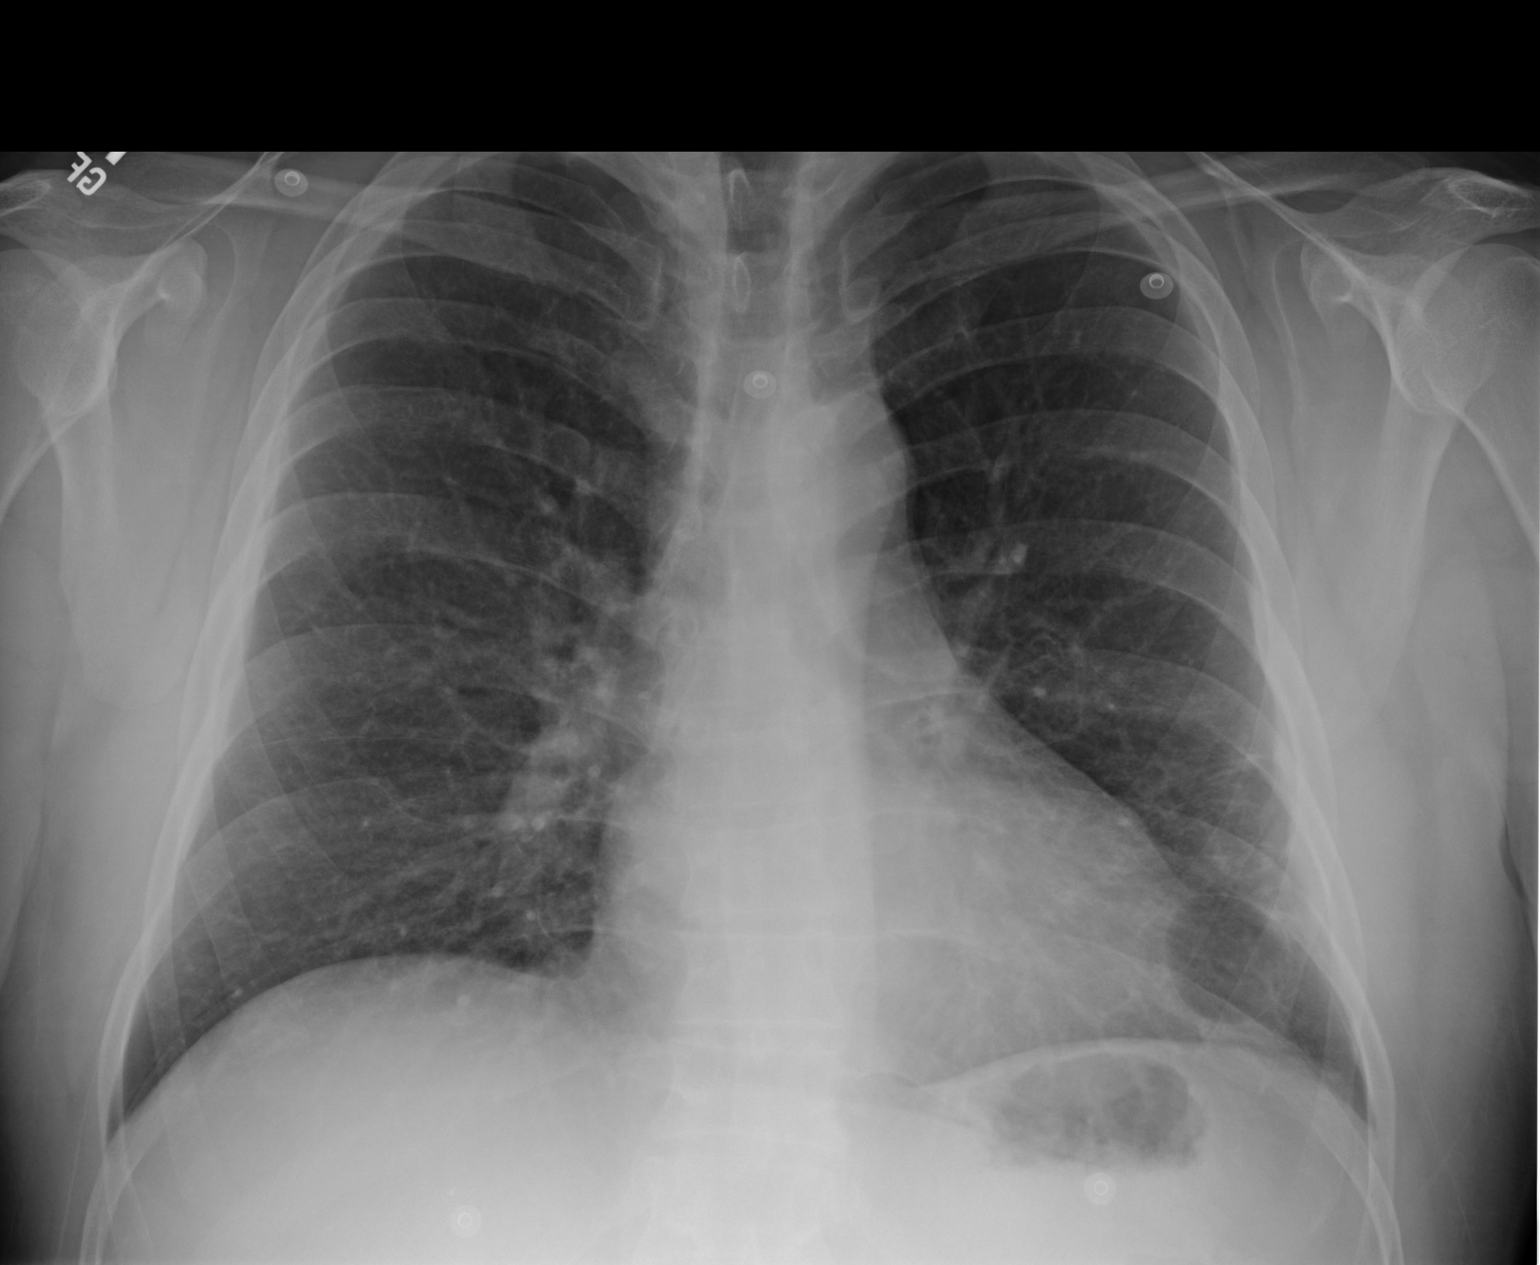
[im 2/2]
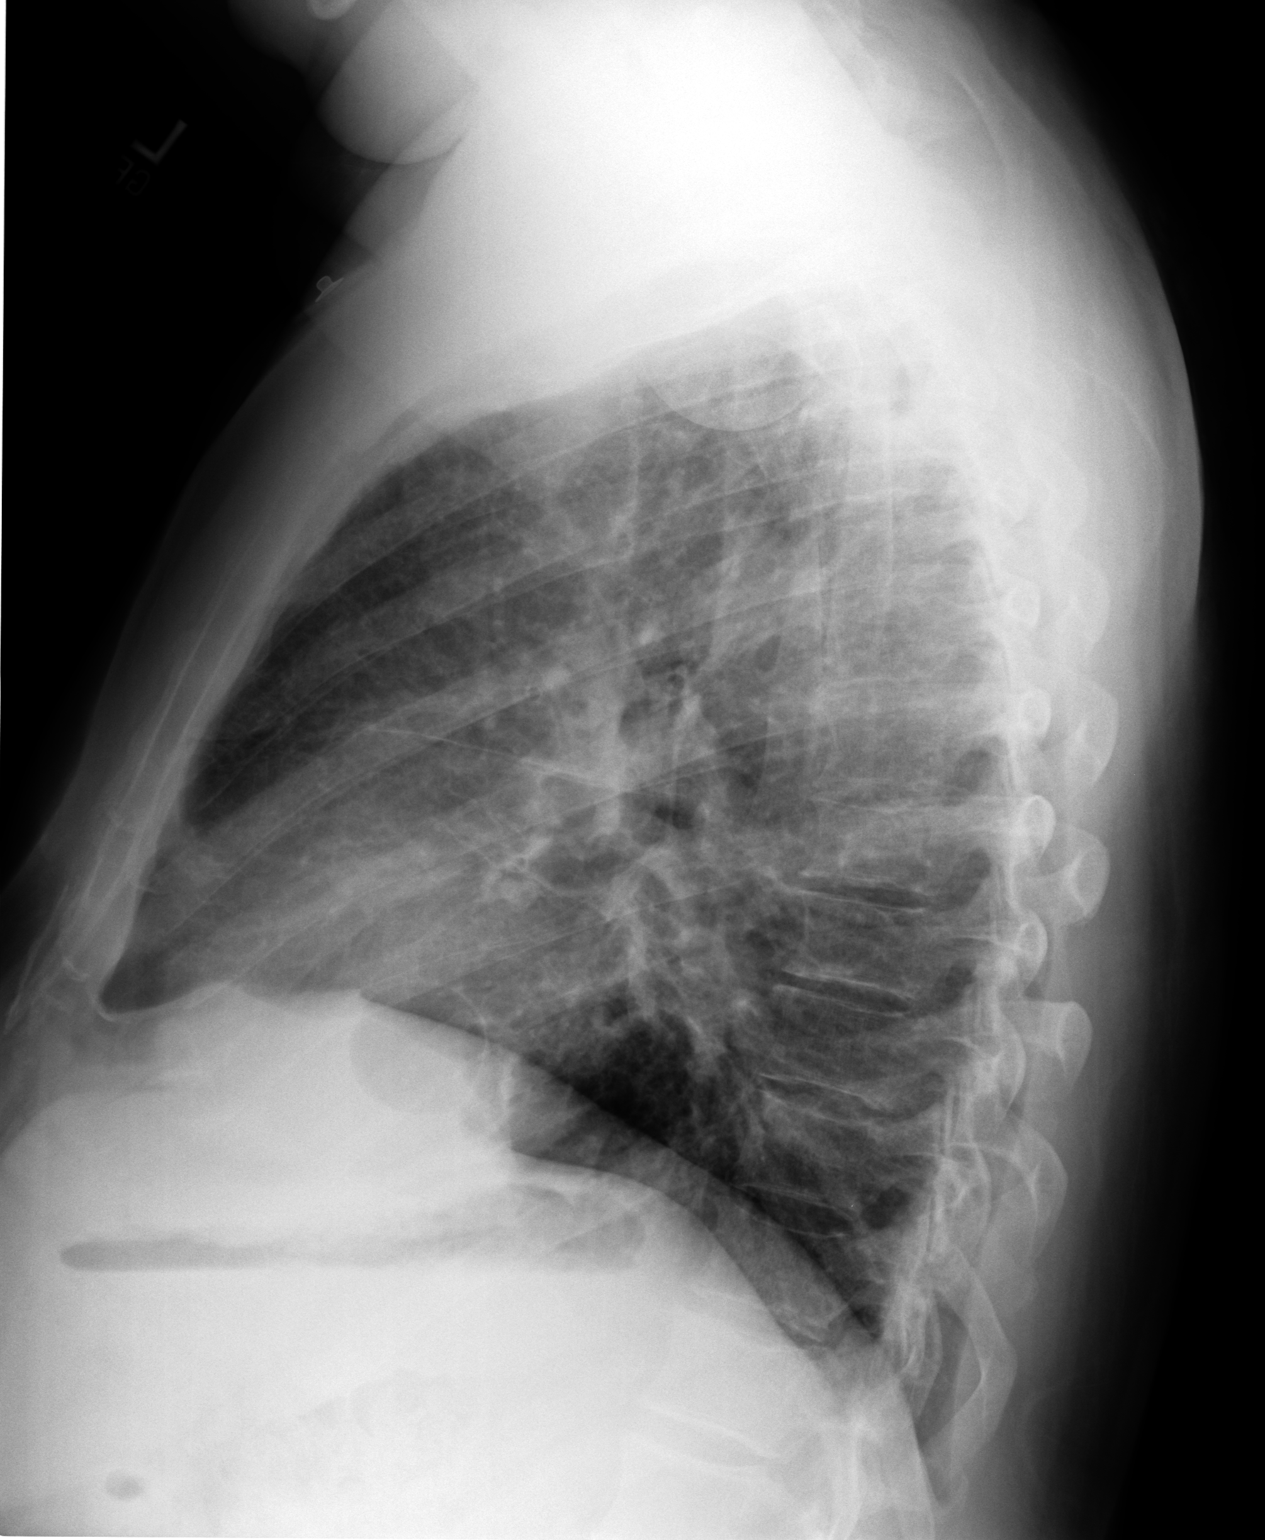

[2 of 2 positions shown; findings below may reference images not displayed]

IMPRESSION: 1. Mild bibasilar atelectasis. Mild infiltrates cannot be excluded. Similar
finding is noted on prior study of [DATE].

## 2010-03-01 ENCOUNTER — Emergency Department: Payer: Self-pay | Admitting: Emergency Medicine

## 2010-03-02 IMAGING — US US EXTREM UP VENOUS*L*
1 series · 18 of 24 positions shown · non-contrast
Comparison: none

REASON FOR EXAM: swollen red tender arm, no fever. dvt vs cellulitis
COMMENTS:

PROCEDURE:     US  - US DOPPLER UP EXTR LEFT  - [DATE]  [DATE]
RESULT:     Standard left upper extremity color-flow duplex Doppler was
obtained and reveals no evidence of deep venous thrombosis. Color flow
analysis and Doppler analysis are normal. Compression studies are normal.

[Series 1: us extrem up venous*left* · 18 of 35 slices shown]
[im 1/35]
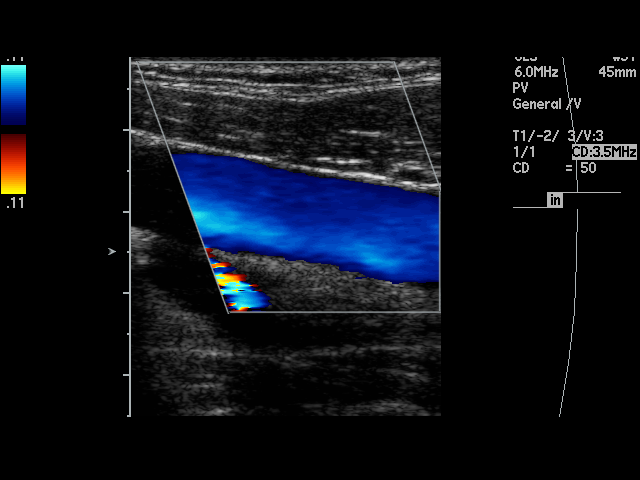
[im 3/35]
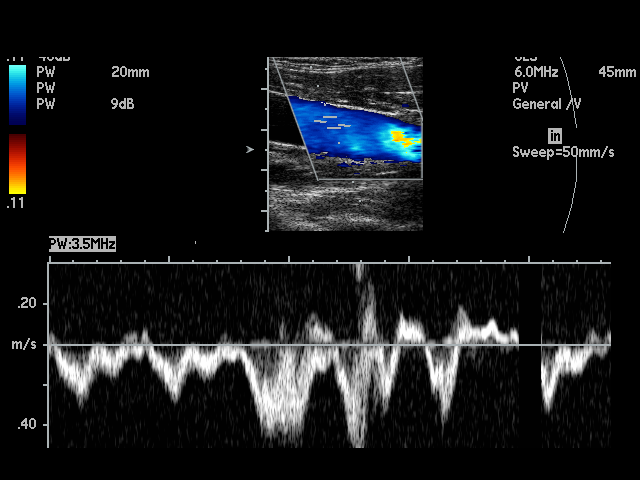
[im 5/35]
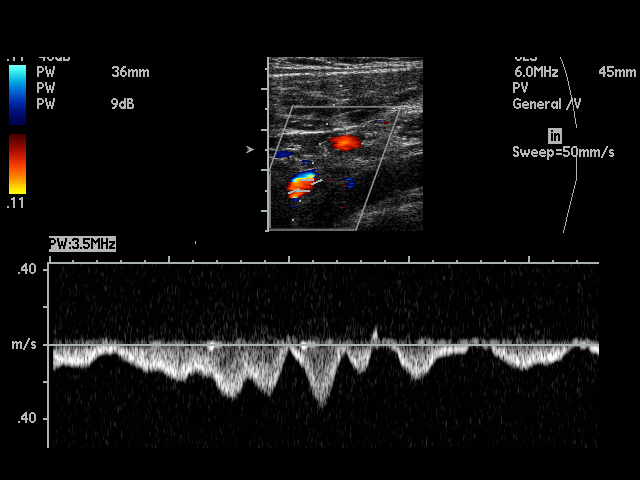
[im 6/35]
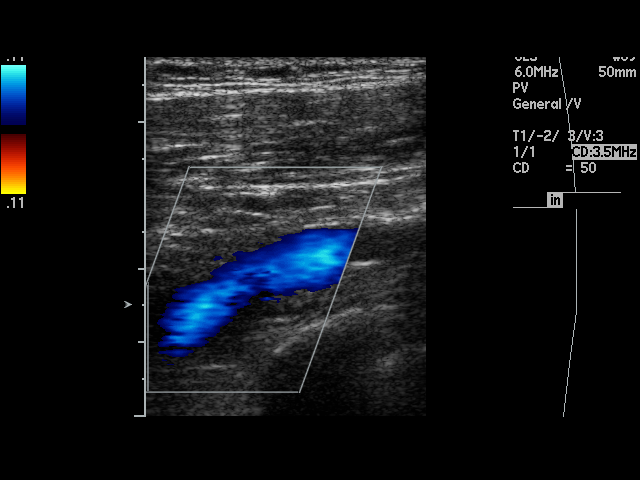
[im 9/35]
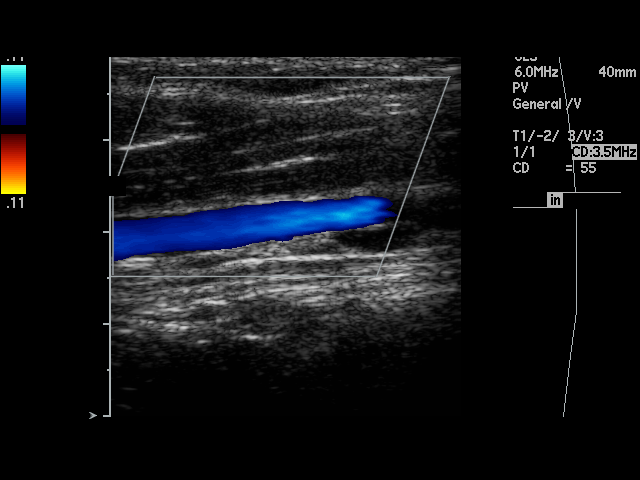
[im 11/35]
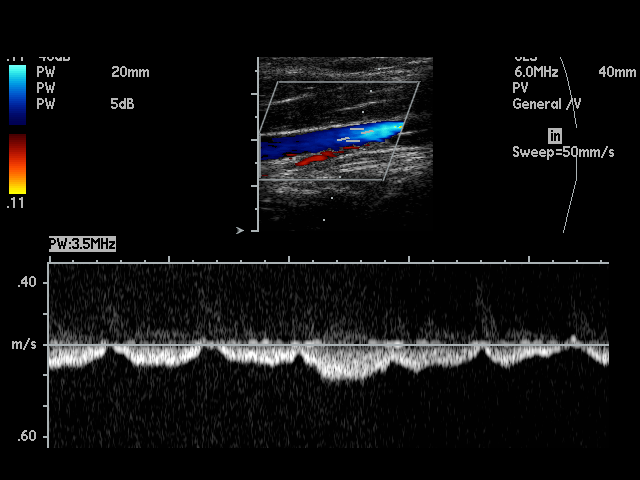
[im 12/35]
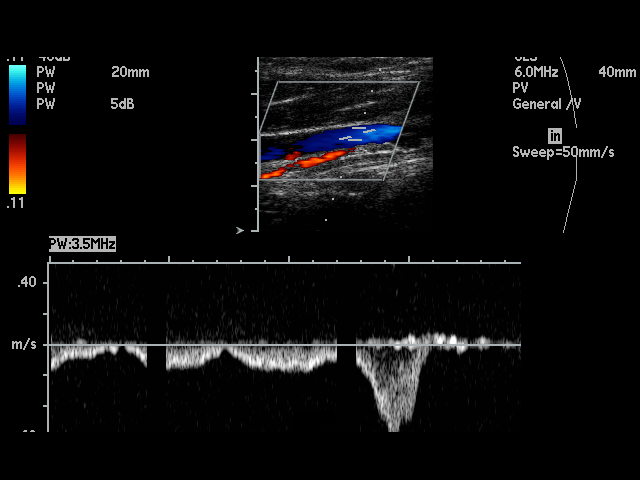
[im 15/35]
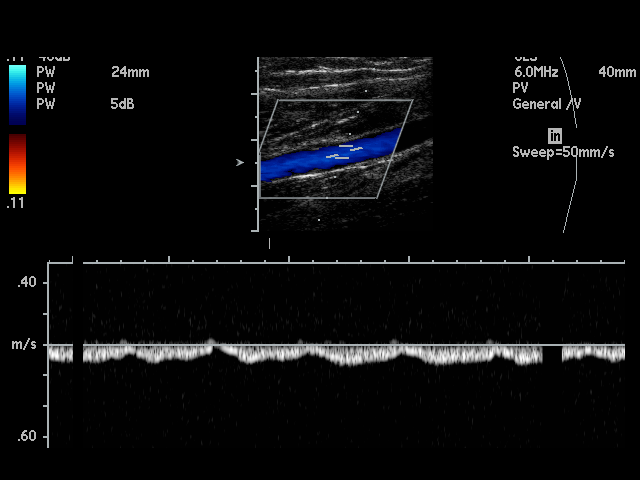
[im 17/35]
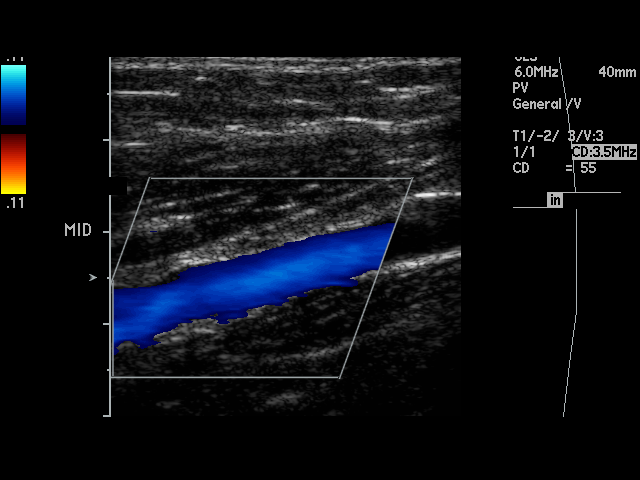
[im 18/35]
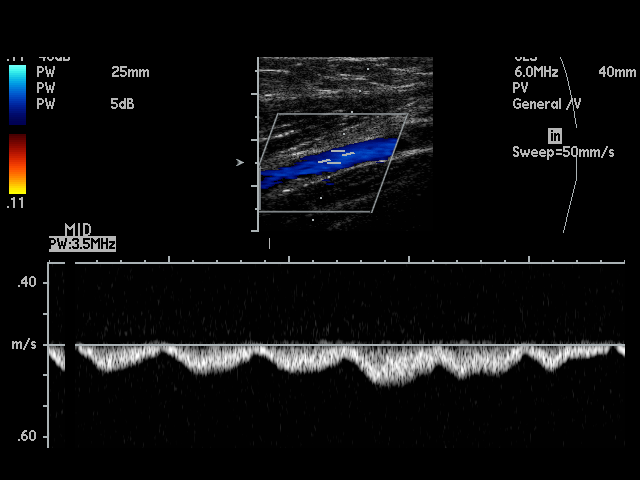
[im 21/35]
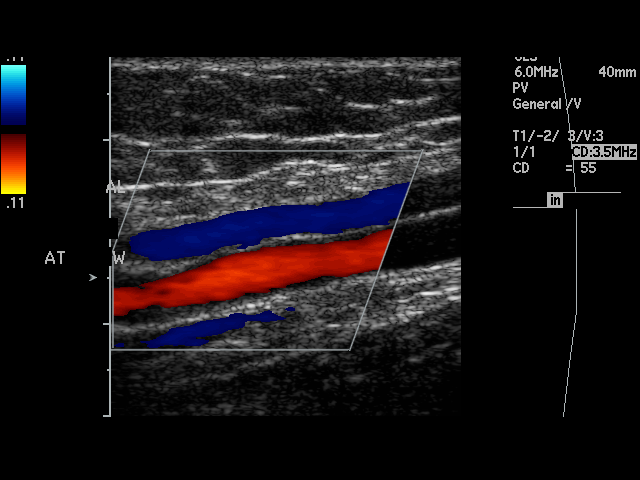
[im 23/35]
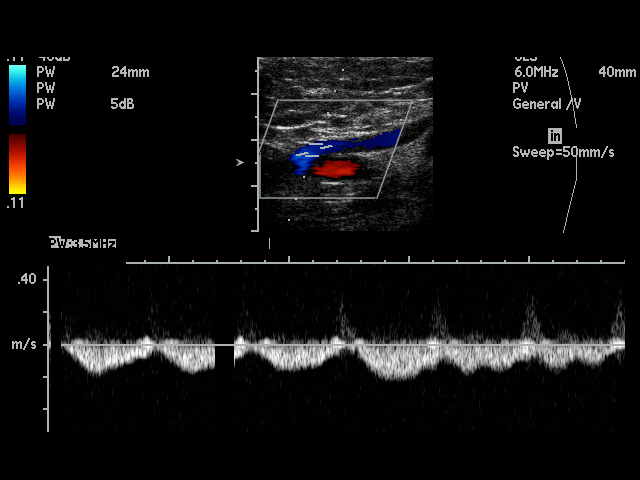
[im 24/35]
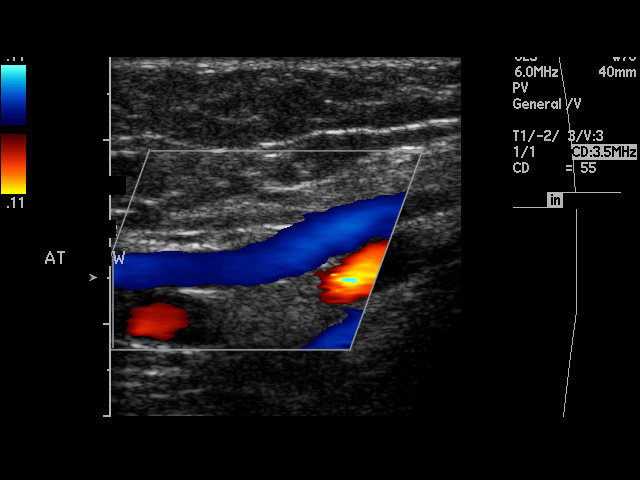
[im 27/35]
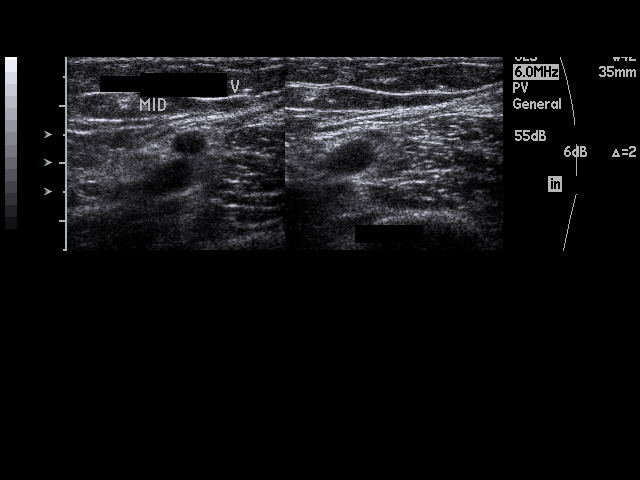
[im 29/35]
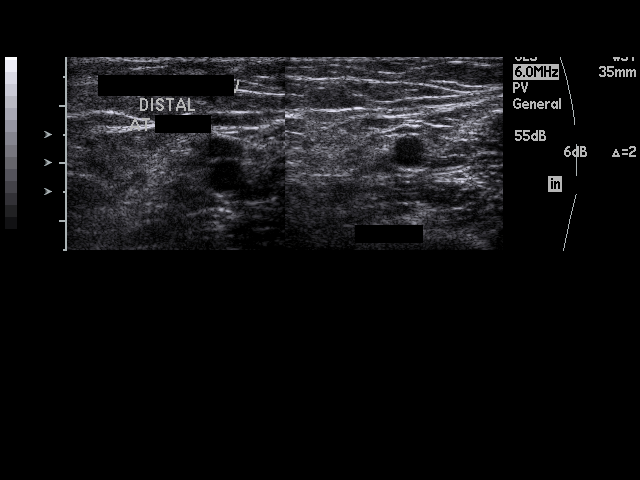
[im 30/35]
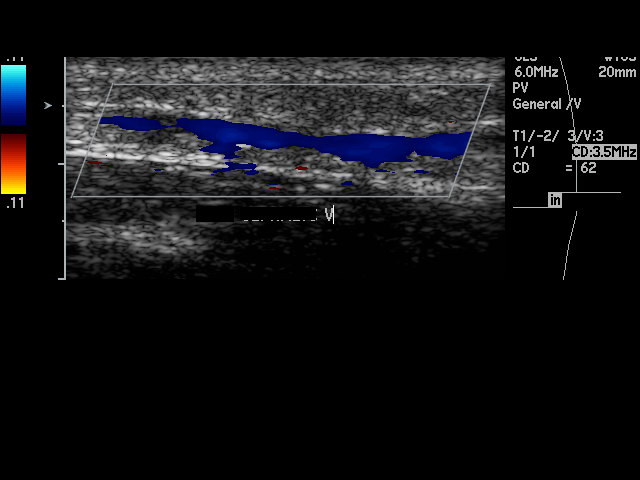
[im 33/35]
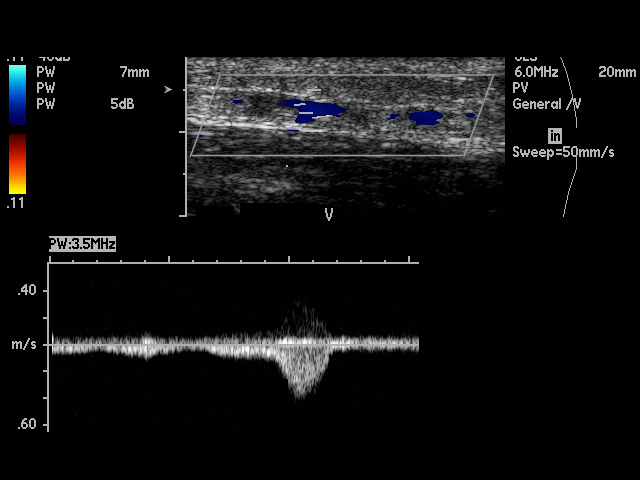
[im 35/35]
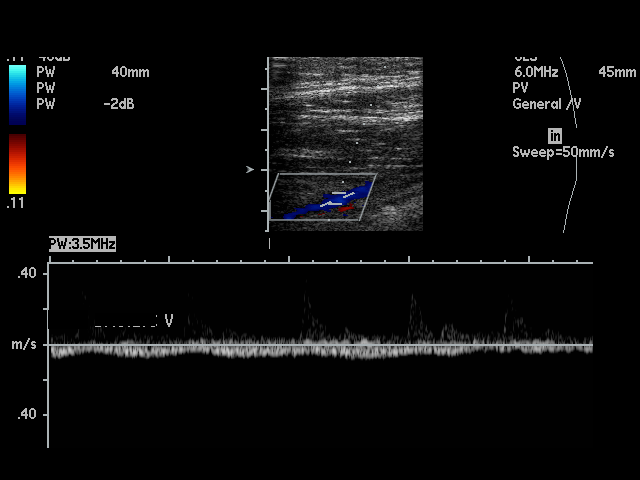

[18 of 24 positions shown; findings below may reference images not displayed]

IMPRESSION: Negative exam.

## 2010-05-15 ENCOUNTER — Emergency Department: Payer: Self-pay | Admitting: Emergency Medicine

## 2010-05-20 ENCOUNTER — Emergency Department: Payer: Self-pay | Admitting: Emergency Medicine

## 2010-10-02 ENCOUNTER — Emergency Department: Payer: Self-pay | Admitting: Internal Medicine

## 2011-04-10 ENCOUNTER — Emergency Department: Payer: Self-pay | Admitting: Emergency Medicine

## 2011-04-10 IMAGING — CR DG LUMBAR SPINE 2-3V
1 series · 3 of 3 positions shown · non-contrast
Comparison: none

REASON FOR EXAM: pain    Flex 6
COMMENTS:   LMP: (Male)

PROCEDURE:     DXR - DXR LUMBAR SPINE AP AND LATERAL  - [DATE]  [DATE]
RESULT:     Comparison: None

[Series 1: t lumbar spine ap · 0.14mm/px · 3 of 3 slices shown]
[im 1/3]
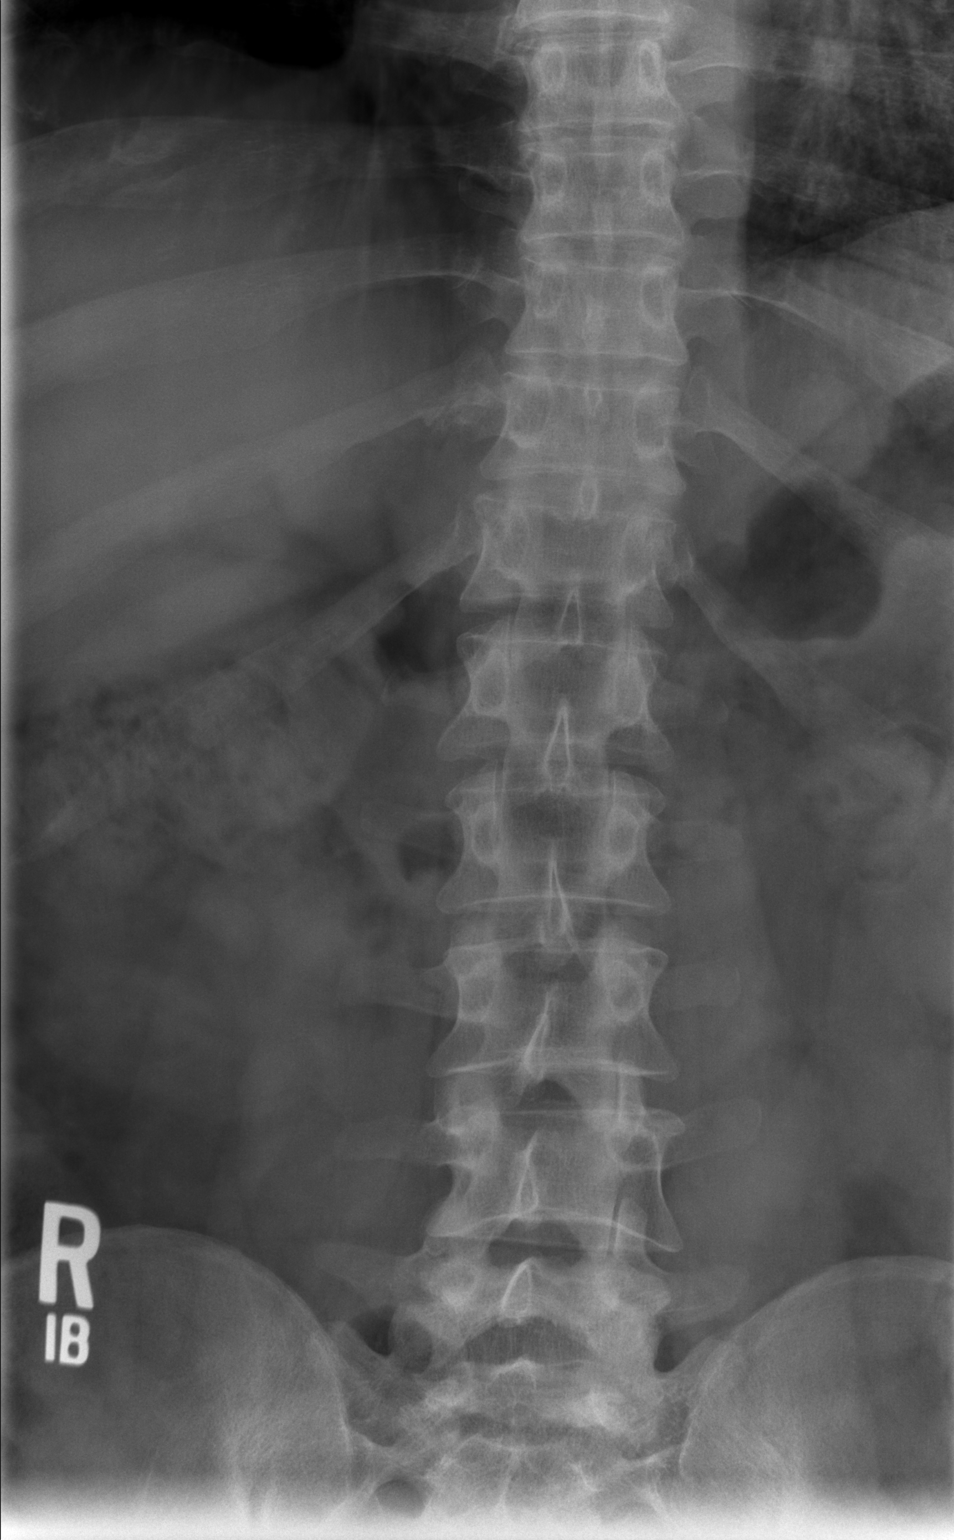
[im 2/3]
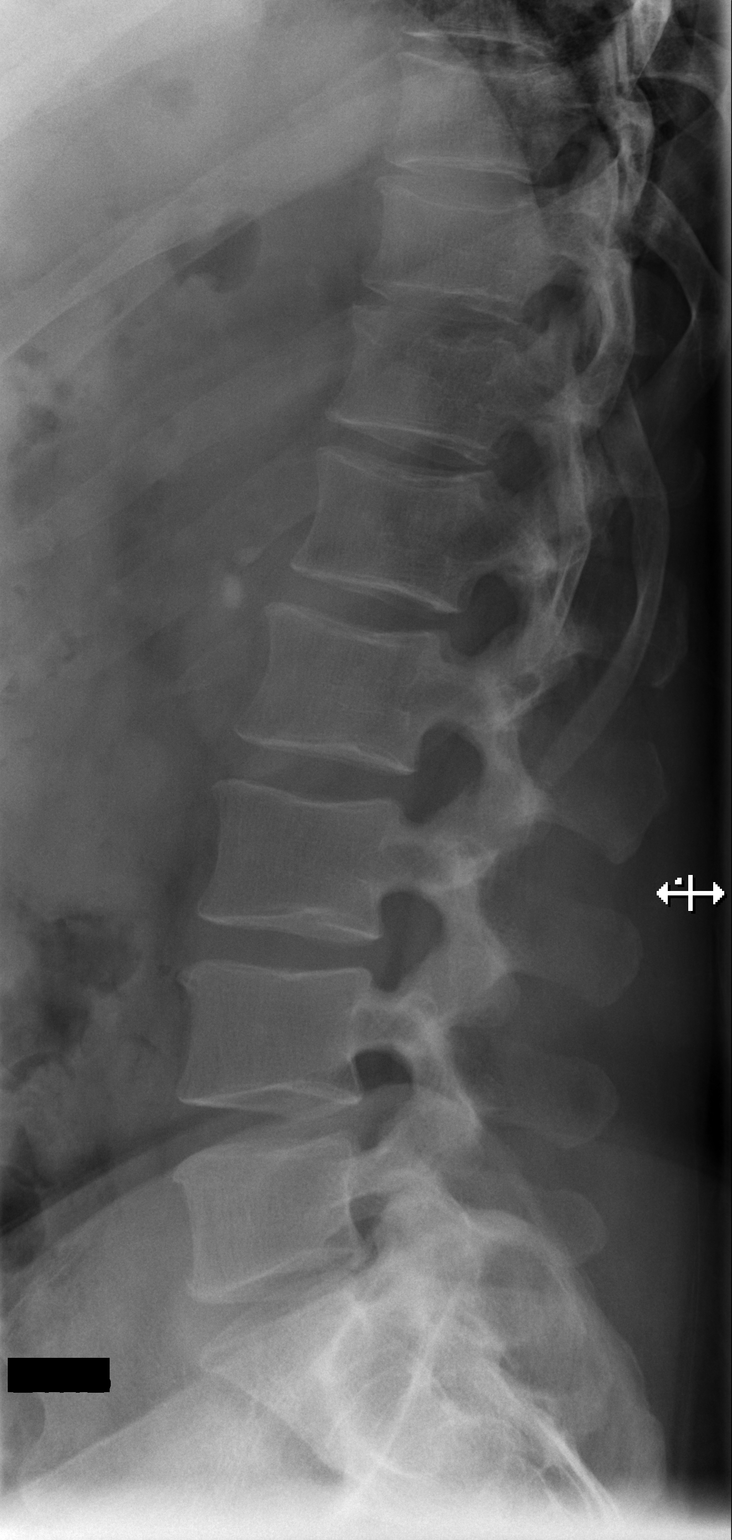
[im 3/3]
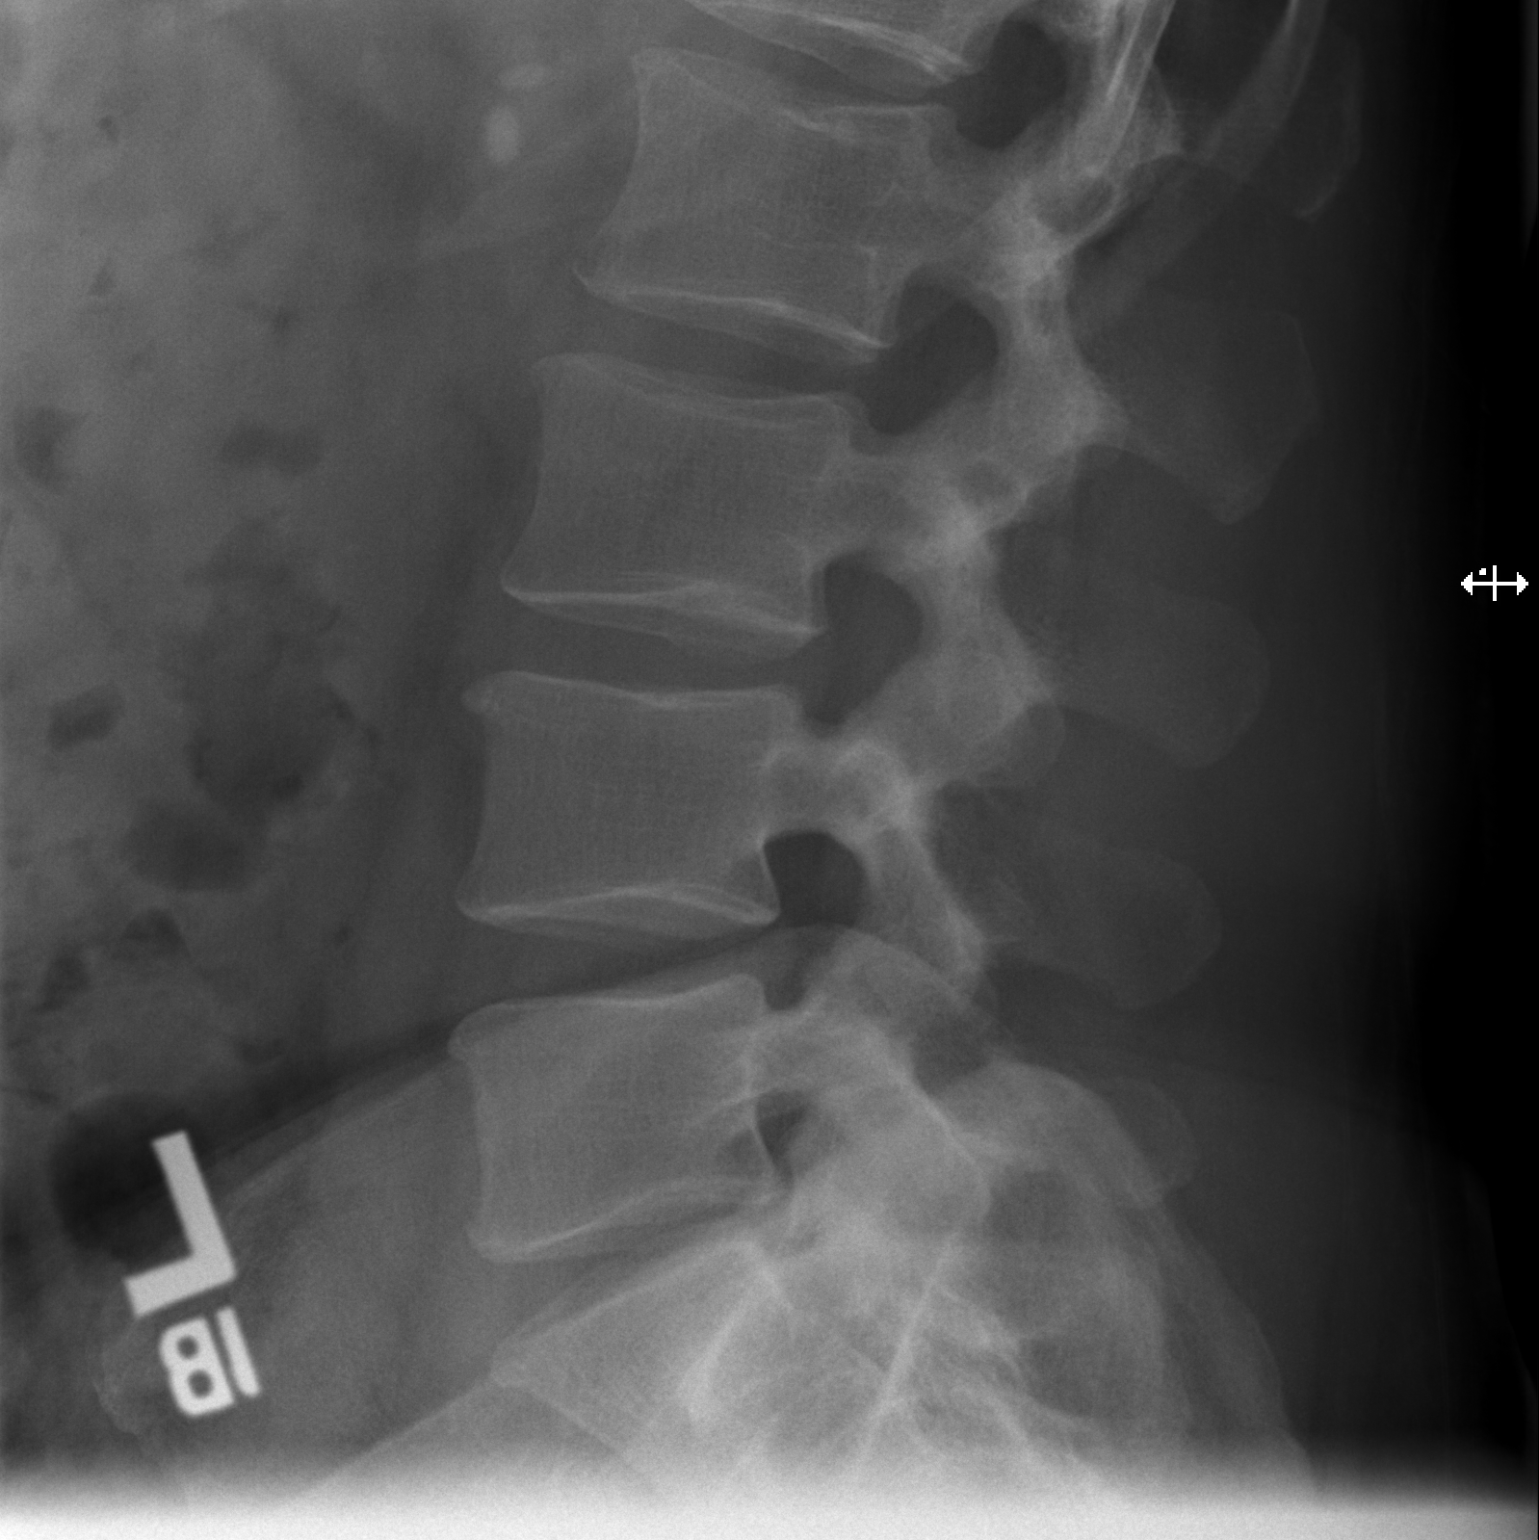

[3 of 3 positions shown; findings below may reference images not displayed]

FINDINGS: AP and lateral views of the lumbar spine and a coned down view of the
lumbosacral junction are provided.

There are 5 nonrib bearing lumbar-type vertebral bodies. The vertebral body
heights are maintained. The alignment is anatomic. There is no
spondylolysis. There is no acute fracture or static listhesis. The disc
spaces are maintained.

The SI joints are unremarkable.
IMPRESSION: 1. No acute osseous abnormality of the lumbar spine.

## 2011-05-29 ENCOUNTER — Inpatient Hospital Stay: Payer: Self-pay | Admitting: Internal Medicine

## 2011-05-29 IMAGING — CR DG CHEST 1V PORT
1 series · 1 of 1 positions shown · non-contrast
Comparison: none

REASON FOR EXAM: Chest Pain
COMMENTS:

PROCEDURE:     DXR - DXR PORTABLE CHEST SINGLE VIEW  - [DATE] [DATE]
RESULT:     Comparison: [DATE]

[view not recorded]
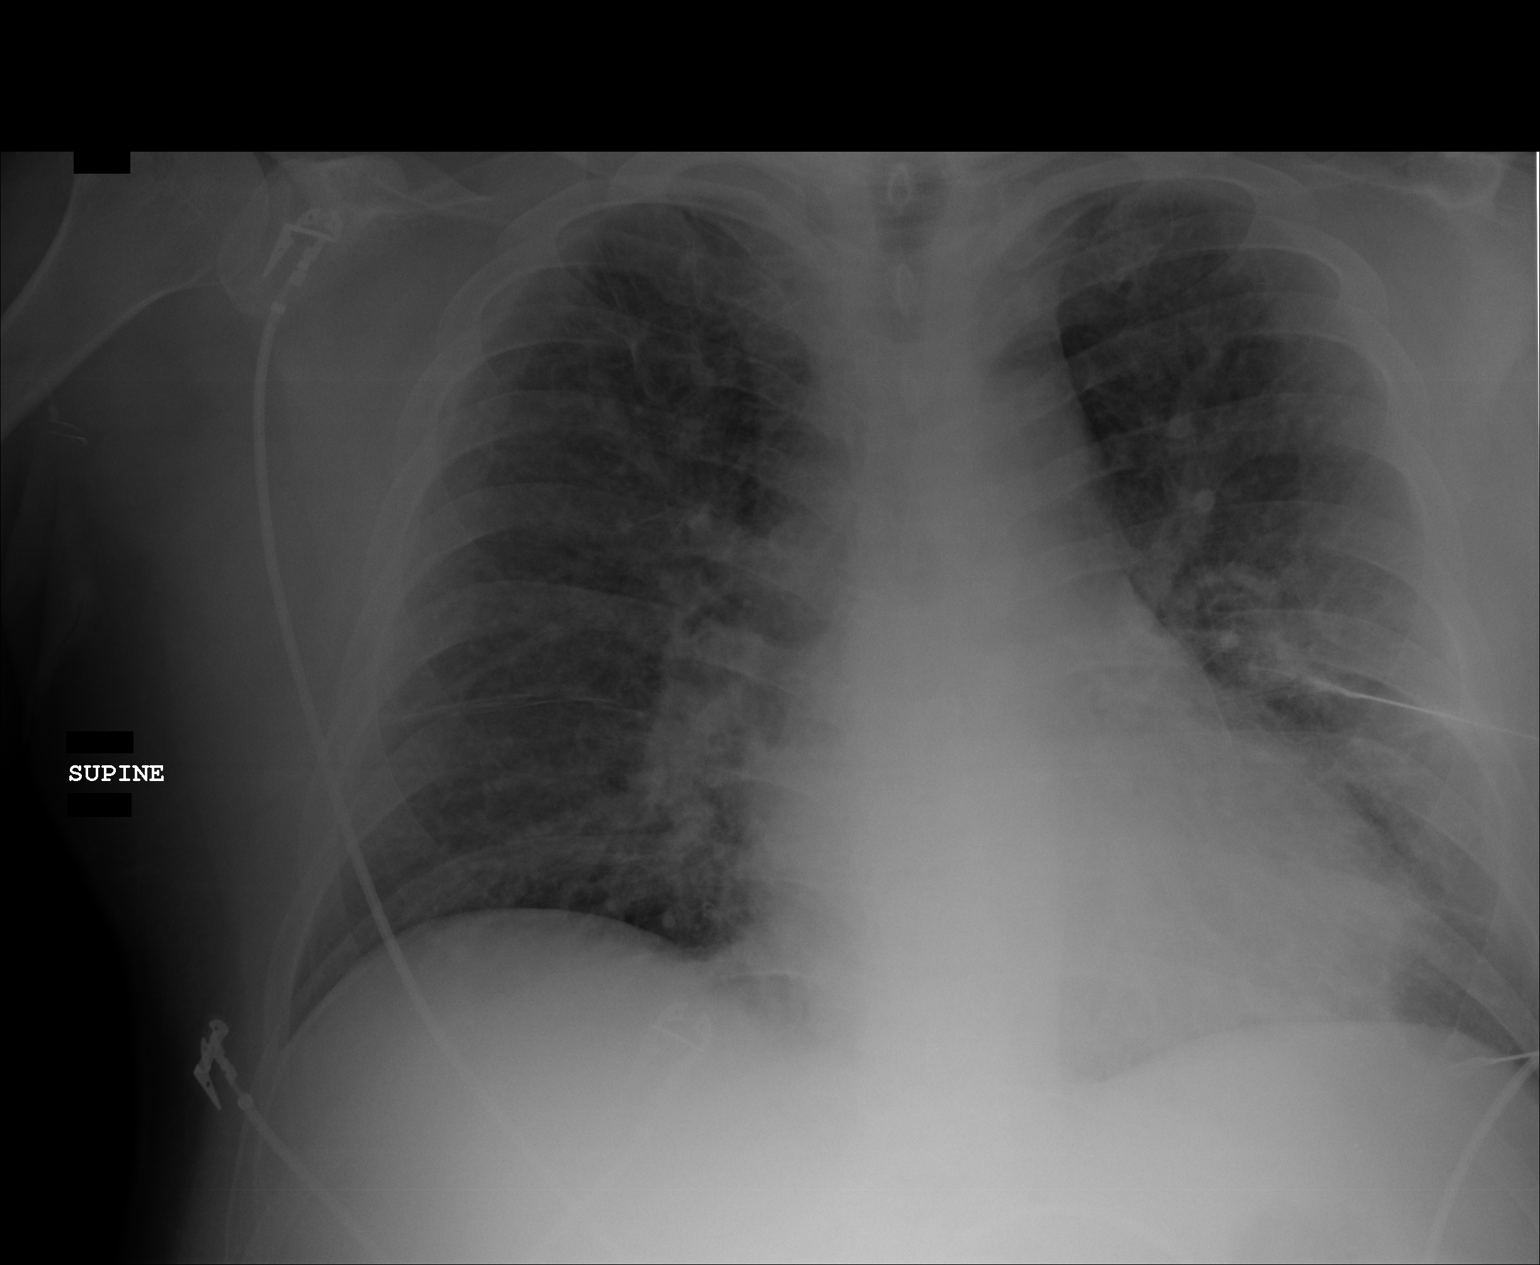

[1 of 1 positions shown; findings below may reference images not displayed]

FINDINGS: The heart is enlarged. There are mild bilateral interstitial pulmonary
opacities, consistent with interstitial pulmonary edema. The left
costophrenic angle is excluded from the field-of-view.
IMPRESSION: Interstitial pulmonary edema.

## 2011-08-16 ENCOUNTER — Emergency Department: Payer: Self-pay | Admitting: *Deleted

## 2011-08-16 LAB — COMPREHENSIVE METABOLIC PANEL
Albumin: 3.3 g/dL — ABNORMAL LOW (ref 3.4–5.0)
Alkaline Phosphatase: 79 U/L (ref 50–136)
Anion Gap: 4 — ABNORMAL LOW (ref 7–16)
BUN: 11 mg/dL (ref 7–18)
Bilirubin,Total: 0.4 mg/dL (ref 0.2–1.0)
Calcium, Total: 8.7 mg/dL (ref 8.5–10.1)
Chloride: 107 mmol/L (ref 98–107)
Co2: 29 mmol/L (ref 21–32)
Creatinine: 0.98 mg/dL (ref 0.60–1.30)
Glucose: 116 mg/dL — ABNORMAL HIGH (ref 65–99)
Osmolality: 280 (ref 275–301)
Potassium: 4.4 mmol/L (ref 3.5–5.1)
Sodium: 140 mmol/L (ref 136–145)
Total Protein: 7.5 g/dL (ref 6.4–8.2)

## 2011-08-16 LAB — TROPONIN I: Troponin-I: <0.02 ng/mL

## 2011-08-16 LAB — CBC
HCT: 44.3 % (ref 40.0–52.0)
HGB: 15 g/dL (ref 13.0–18.0)
MCH: 31.1 pg (ref 26.0–34.0)
MCHC: 33.8 g/dL (ref 32.0–36.0)
MCV: 92 fL (ref 80–100)
Platelet: 386 x10 3/mm 3 (ref 150–440)
RBC: 4.83 x10 6/mm 3 (ref 4.40–5.90)
RDW: 13.5 % (ref 11.5–14.5)
WBC: 12.5 x10 3/mm 3 — ABNORMAL HIGH (ref 3.8–10.6)

## 2011-08-16 IMAGING — CR DG CHEST 2V
1 series · 3 of 3 positions shown · non-contrast
Comparison: none

REASON FOR EXAM: weakness
COMMENTS:

[Series 1: w chest pa · 0.14mm/px · 3 of 3 slices shown]
[im 1/3]
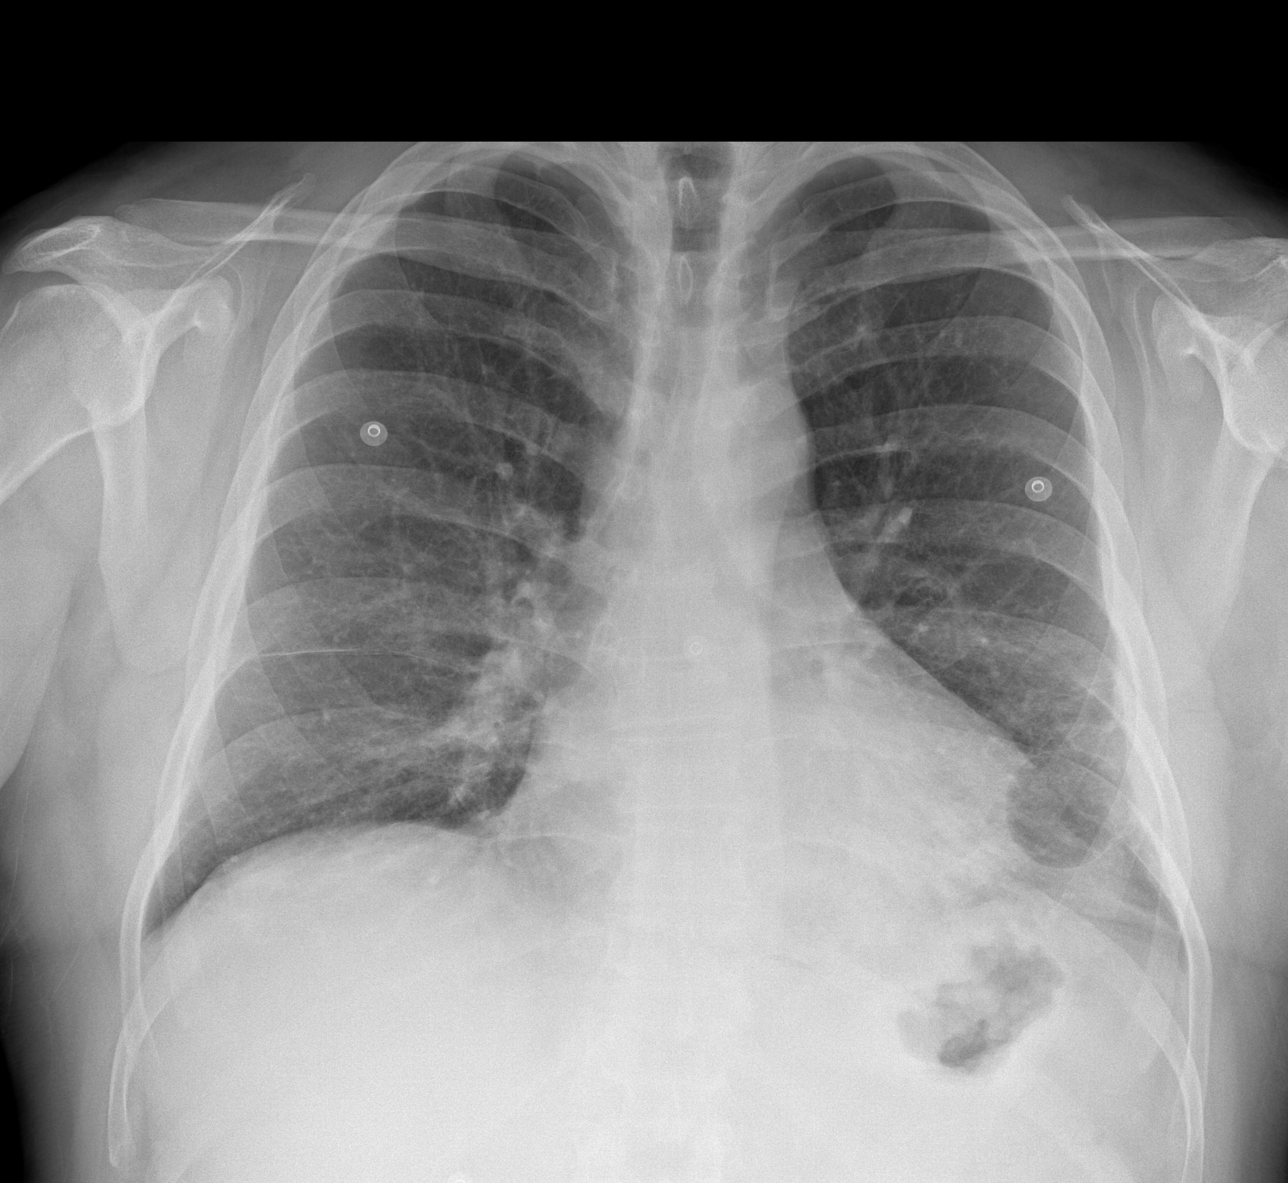
[im 2/3]
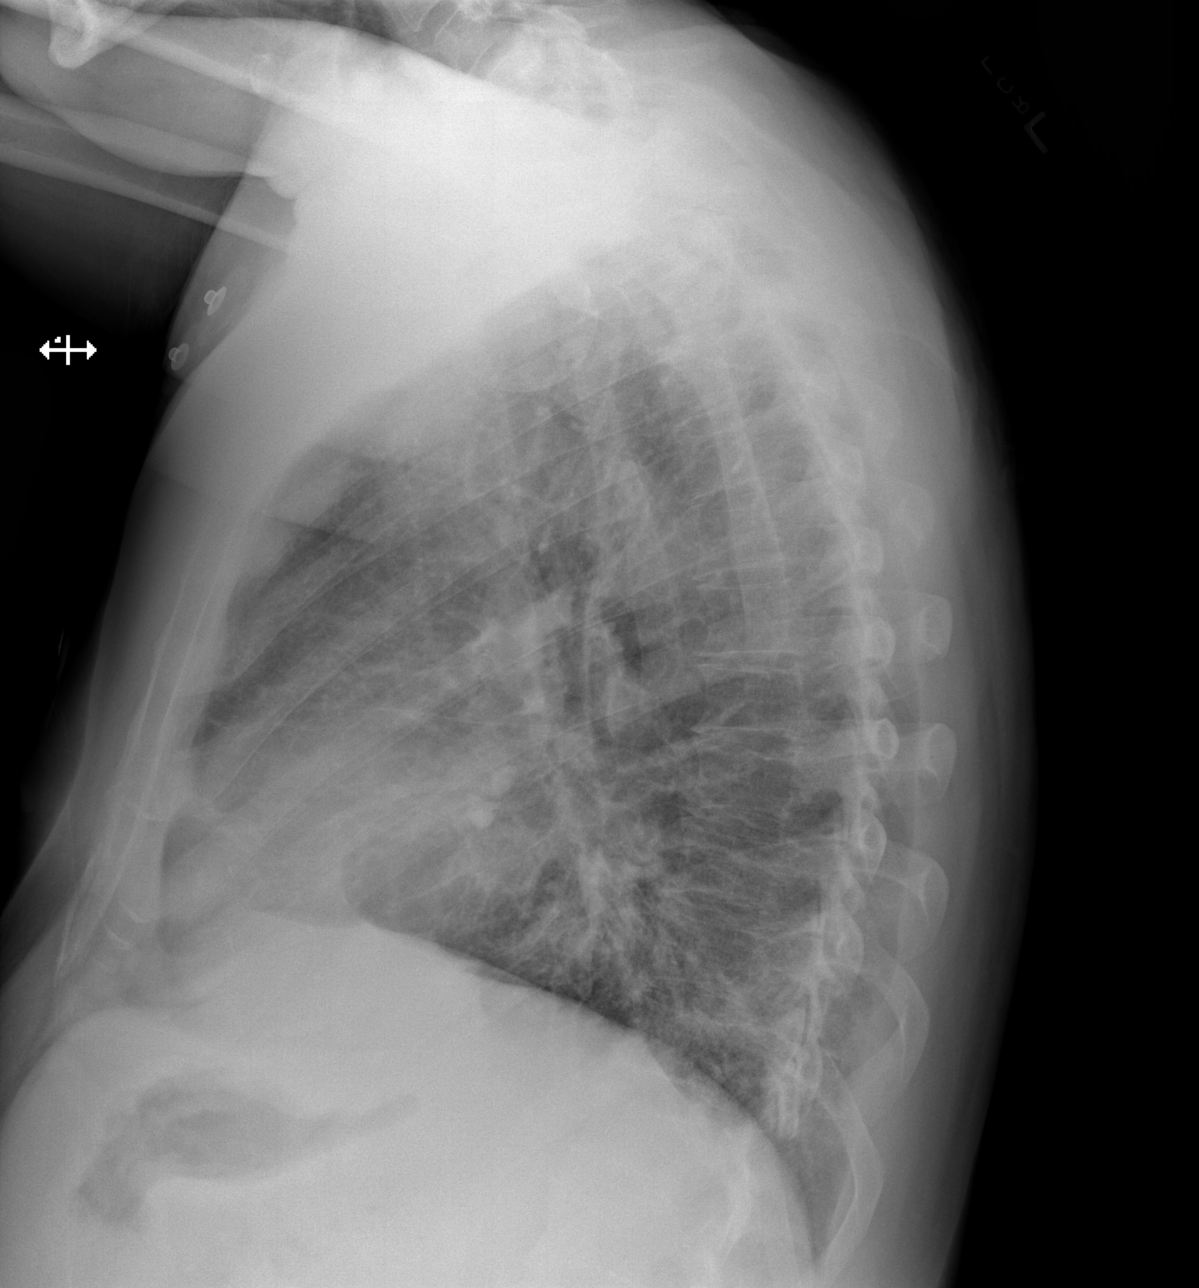
[im 3/3]
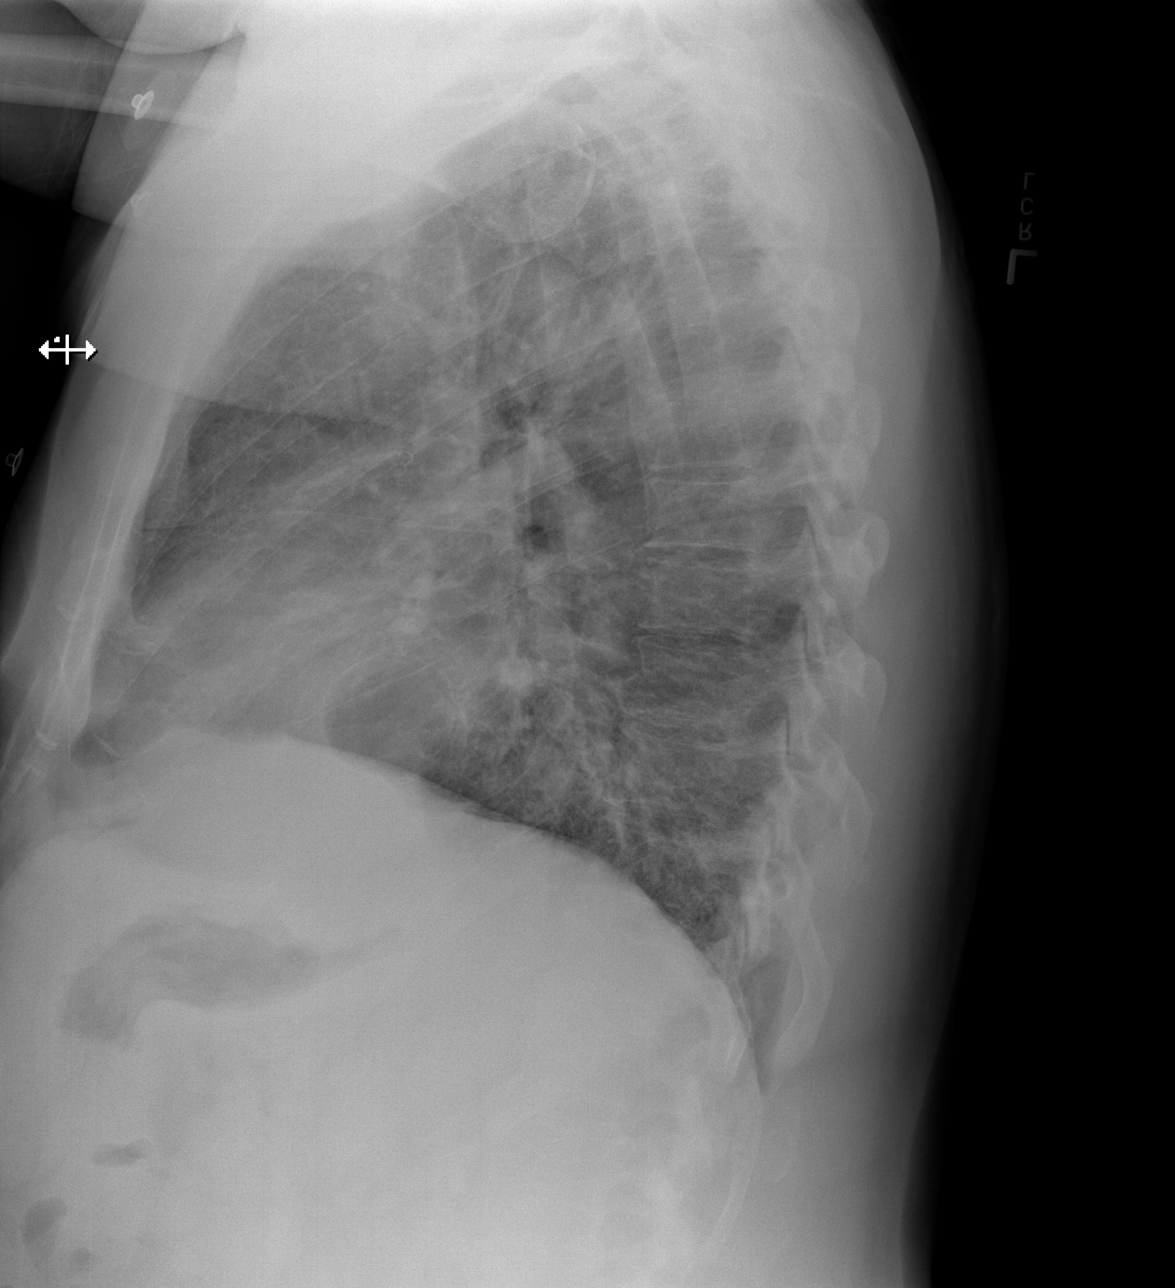

[3 of 3 positions shown; findings below may reference images not displayed]

PROCEDURE:     DXR - DXR CHEST PA (OR AP) AND LATERAL  - [DATE] [DATE]

RESULT:     Comparison is made to a study [DATE].

The lungs are mildly hyperinflated. The cardiac silhouette is enlarged.
There are coarse lung markings in the retrocardiac region on the left.
Mildly increased lung markings are noted in the perihilar regions.
IMPRESSION: The findings are consistent with COPD and superimposed left
lower lobe pneumonia. Mild perihilar subsegmental atelectasis is present
bilaterally as well.

## 2011-08-18 ENCOUNTER — Emergency Department: Payer: Self-pay | Admitting: Emergency Medicine

## 2012-03-13 ENCOUNTER — Emergency Department: Payer: Self-pay | Admitting: Emergency Medicine

## 2012-03-13 LAB — CK TOTAL AND CKMB (NOT AT ARMC)
CK, Total: 49 U/L (ref 35–232)
CK-MB: 0.5 ng/mL (ref 0.5–3.6)

## 2012-03-13 LAB — CBC
MCH: 31.1 pg (ref 26.0–34.0)
MCHC: 33.8 g/dL (ref 32.0–36.0)
MCV: 92 fL (ref 80–100)
Platelet: 401 10*3/uL (ref 150–440)
RDW: 13.6 % (ref 11.5–14.5)
WBC: 24.9 10*3/uL — ABNORMAL HIGH (ref 3.8–10.6)

## 2012-03-13 LAB — BASIC METABOLIC PANEL
BUN: 18 mg/dL (ref 7–18)
Calcium, Total: 8.7 mg/dL (ref 8.5–10.1)
Chloride: 108 mmol/L — ABNORMAL HIGH (ref 98–107)
Co2: 28 mmol/L (ref 21–32)
Creatinine: 0.94 mg/dL (ref 0.60–1.30)
EGFR (African American): >60
Potassium: 4.2 mmol/L (ref 3.5–5.1)
Sodium: 141 mmol/L (ref 136–145)

## 2012-03-13 LAB — TROPONIN I: Troponin-I: <0.02 ng/mL

## 2012-03-13 IMAGING — CR DG CHEST 2V
1 series · 2 of 2 positions shown · non-contrast
Comparison: none

REASON FOR EXAM: SOB
COMMENTS:

PROCEDURE:     DXR - DXR CHEST PA (OR AP) AND LATERAL  - [DATE] [DATE]
RESULT:     Comparison: None

[Series 1: pa · 0.17mm/px · 2 of 2 slices shown]
[im 1/2]
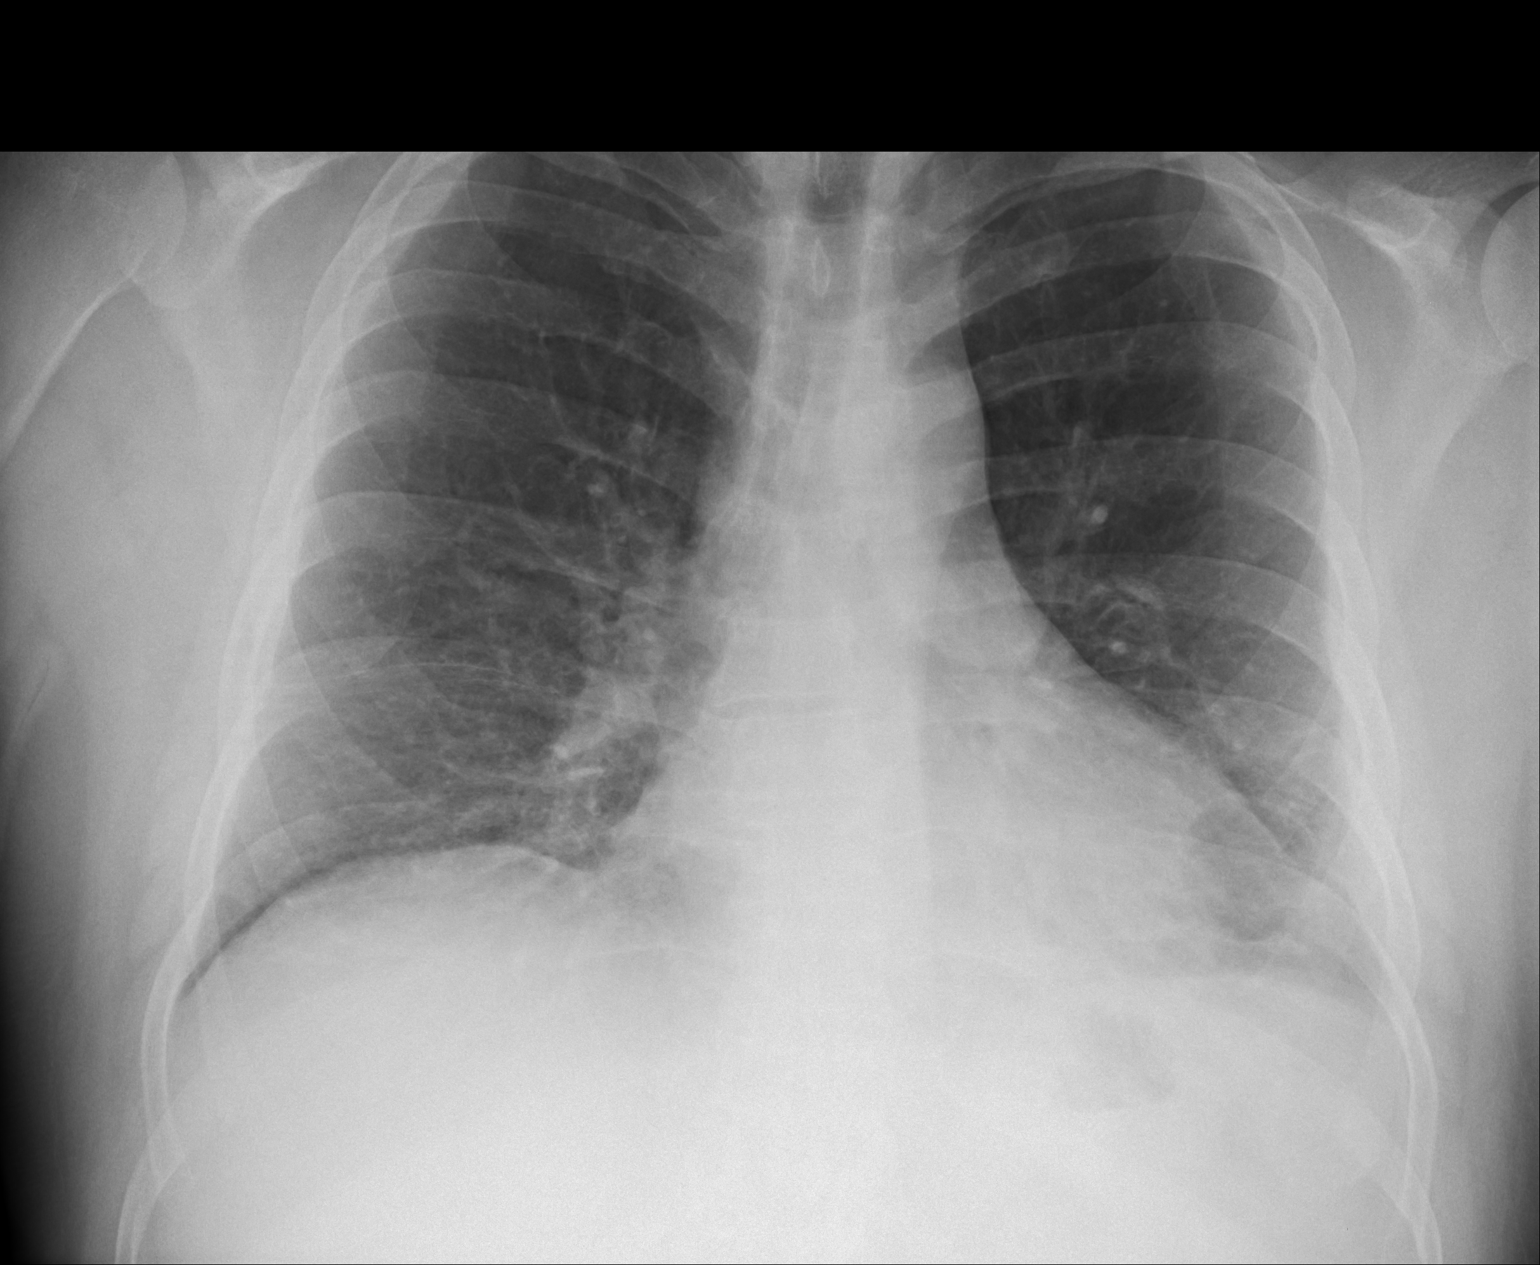
[im 2/2]
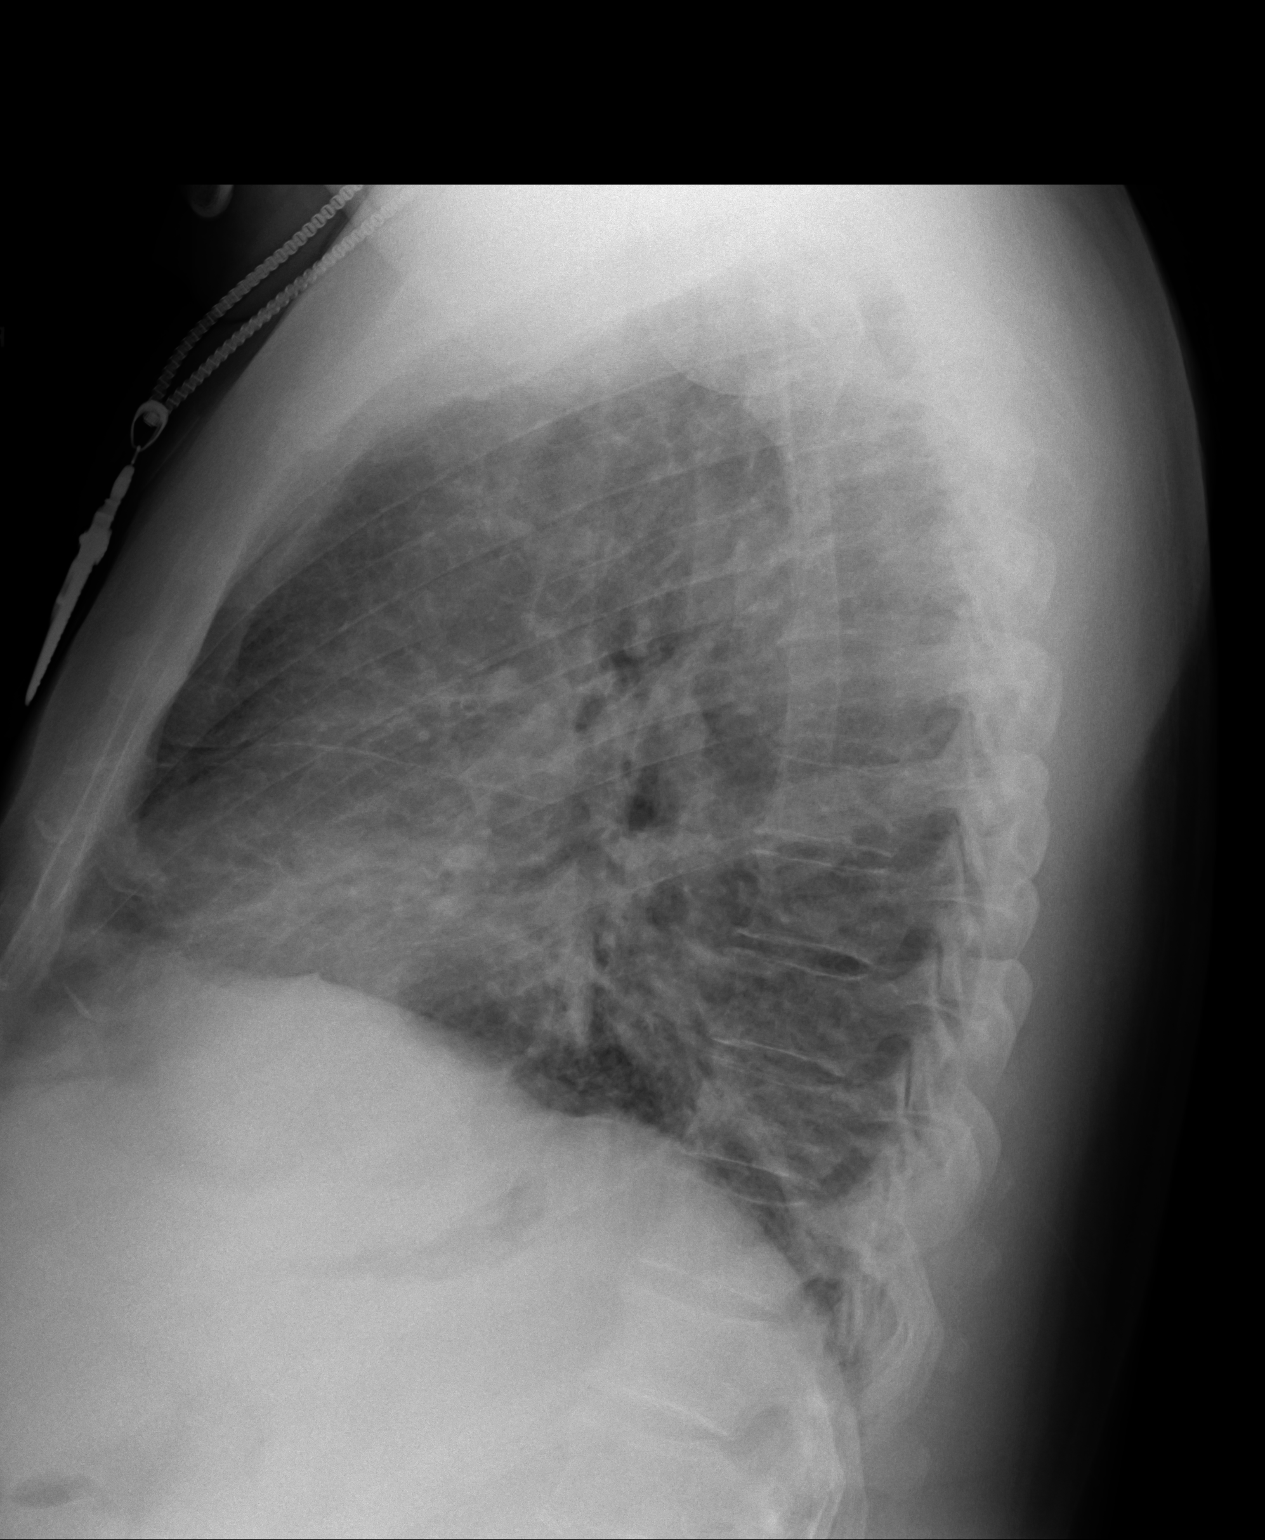

[2 of 2 positions shown; findings below may reference images not displayed]

FINDINGS: PA and lateral chest radiographs are provided.  There is no focal
parenchymal opacity, pleural effusion, or pneumothorax. The heart and
mediastinum are unremarkable.  The osseous structures are unremarkable.
IMPRESSION: No acute disease of the che[REDACTED]

## 2012-03-23 ENCOUNTER — Emergency Department: Payer: Self-pay | Admitting: *Deleted

## 2012-03-23 LAB — BASIC METABOLIC PANEL
Calcium, Total: 9.2 mg/dL (ref 8.5–10.1)
Co2: 28 mmol/L (ref 21–32)
Creatinine: 0.99 mg/dL (ref 0.60–1.30)
EGFR (African American): >60
EGFR (Non-African Amer.): >60
Glucose: 93 mg/dL (ref 65–99)
Osmolality: 277 (ref 275–301)
Sodium: 138 mmol/L (ref 136–145)

## 2012-03-23 LAB — CK TOTAL AND CKMB (NOT AT ARMC)
CK, Total: 74 U/L (ref 35–232)
CK-MB: 1.4 ng/mL (ref 0.5–3.6)

## 2012-03-23 LAB — CBC
HGB: 14.7 g/dL (ref 13.0–18.0)
MCHC: 34.6 g/dL (ref 32.0–36.0)
RDW: 13.6 % (ref 11.5–14.5)

## 2012-03-23 LAB — TROPONIN I: Troponin-I: 0.03 ng/mL

## 2012-03-24 IMAGING — CR DG CHEST 2V
1 series · 2 of 2 positions shown · non-contrast
Comparison: none

REASON FOR EXAM: chest pain
COMMENTS:

[Series 1: w chest pa · 0.14mm/px · 2 of 2 slices shown]
[im 1/2]
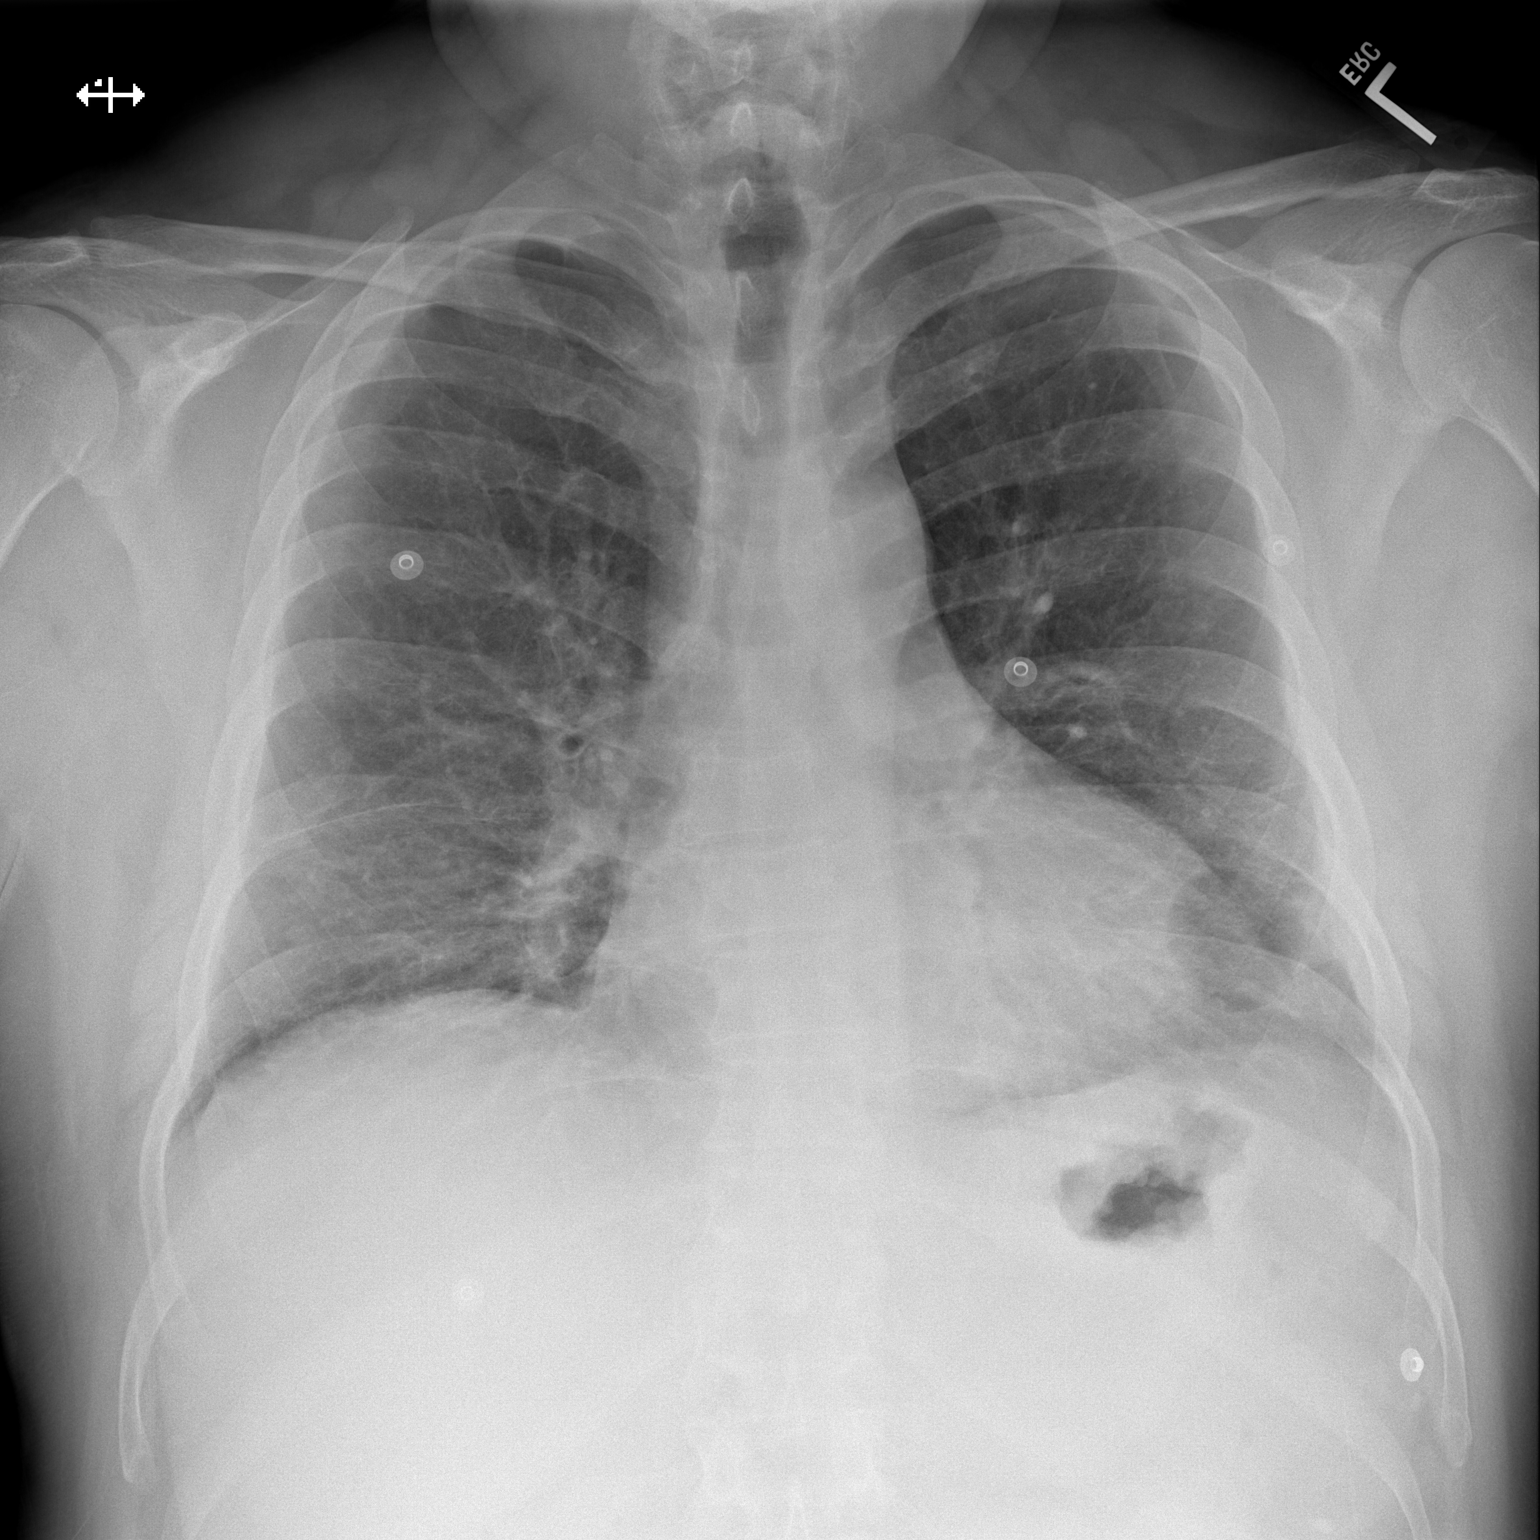
[im 2/2]
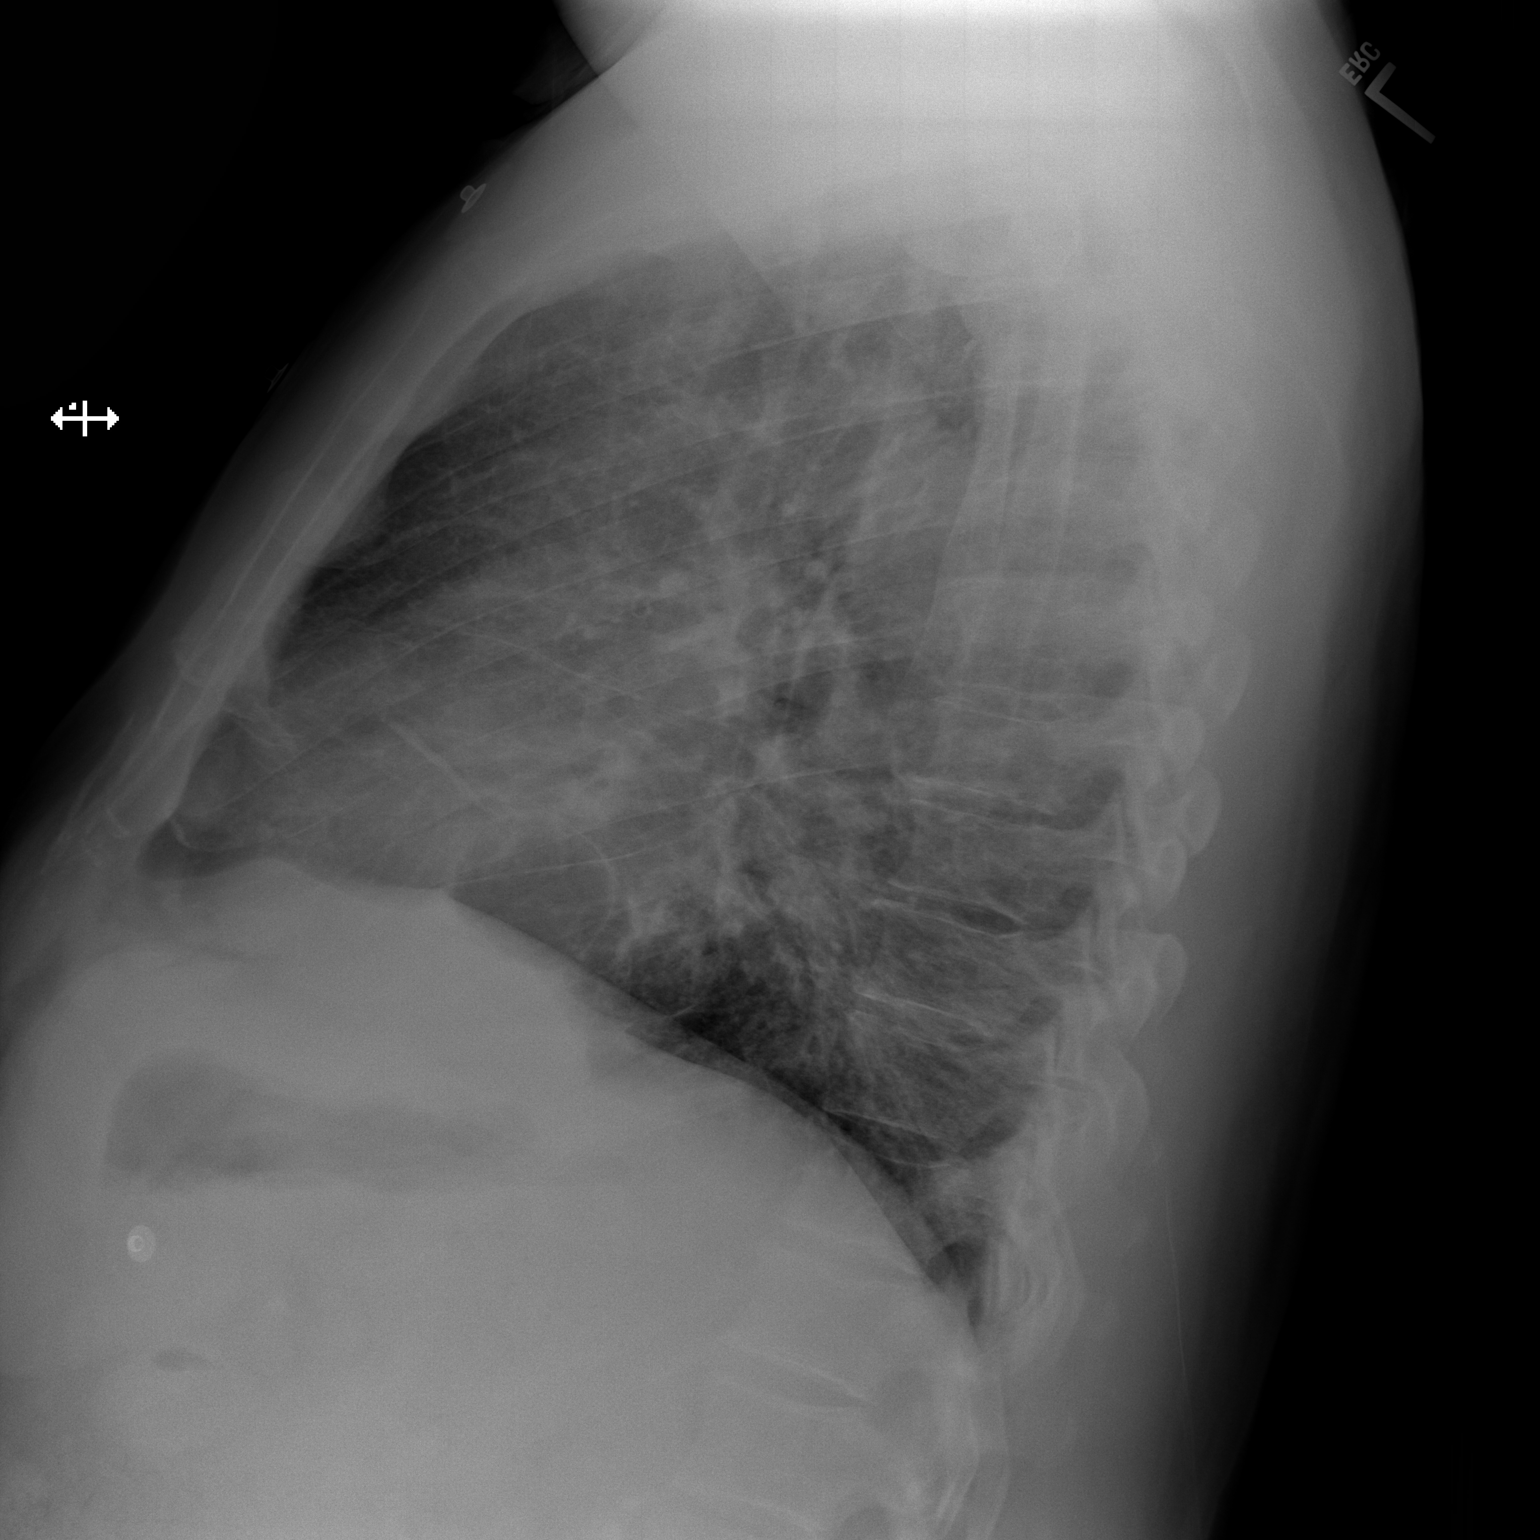

[2 of 2 positions shown; findings below may reference images not displayed]

PROCEDURE:     DXR - DXR CHEST PA (OR AP) AND LATERAL  - [DATE]  [DATE]

RESULT:     Comparison is made to the previous study [DATE].

Increased density is seen at the lung base on the right with depression of
the minor fissure suggestive of atelectasis. Minimal right lung base
infiltrate is not excluded. The lungs are otherwise clear. The heart and
pulmonary normal.
IMPRESSION: 1. Minimal right lung base atelectasis versus infiltrate. Followup to
document improvement is suggested.

[REDACTED]

## 2012-12-04 ENCOUNTER — Emergency Department: Payer: Self-pay | Admitting: Emergency Medicine

## 2012-12-04 LAB — CBC
HCT: 45.8 % (ref 40.0–52.0)
HGB: 15.4 g/dL (ref 13.0–18.0)
MCH: 31 pg (ref 26.0–34.0)
MCHC: 33.7 g/dL (ref 32.0–36.0)
RBC: 4.97 10*6/uL (ref 4.40–5.90)

## 2012-12-04 LAB — COMPREHENSIVE METABOLIC PANEL
Anion Gap: 5 — ABNORMAL LOW (ref 7–16)
BUN: 15 mg/dL (ref 7–18)
Co2: 27 mmol/L (ref 21–32)
EGFR (African American): >60
Osmolality: 276 (ref 275–301)
SGPT (ALT): 24 U/L (ref 12–78)
Sodium: 138 mmol/L (ref 136–145)
Total Protein: 7.7 g/dL (ref 6.4–8.2)

## 2012-12-04 LAB — TROPONIN I: Troponin-I: 0.02 ng/mL

## 2013-06-19 ENCOUNTER — Emergency Department: Payer: Self-pay | Admitting: Emergency Medicine

## 2013-06-19 IMAGING — CR DG CHEST 2V
1 series · 2 of 2 positions shown · non-contrast
Comparison: Chest x-ray [DATE].

CLINICAL DATA: Chest pain and congestion for the past week.

EXAM:
CHEST  2 VIEW

[Series 1: pa · 0.17mm/px · 2 of 2 slices shown]
[im 1/2]
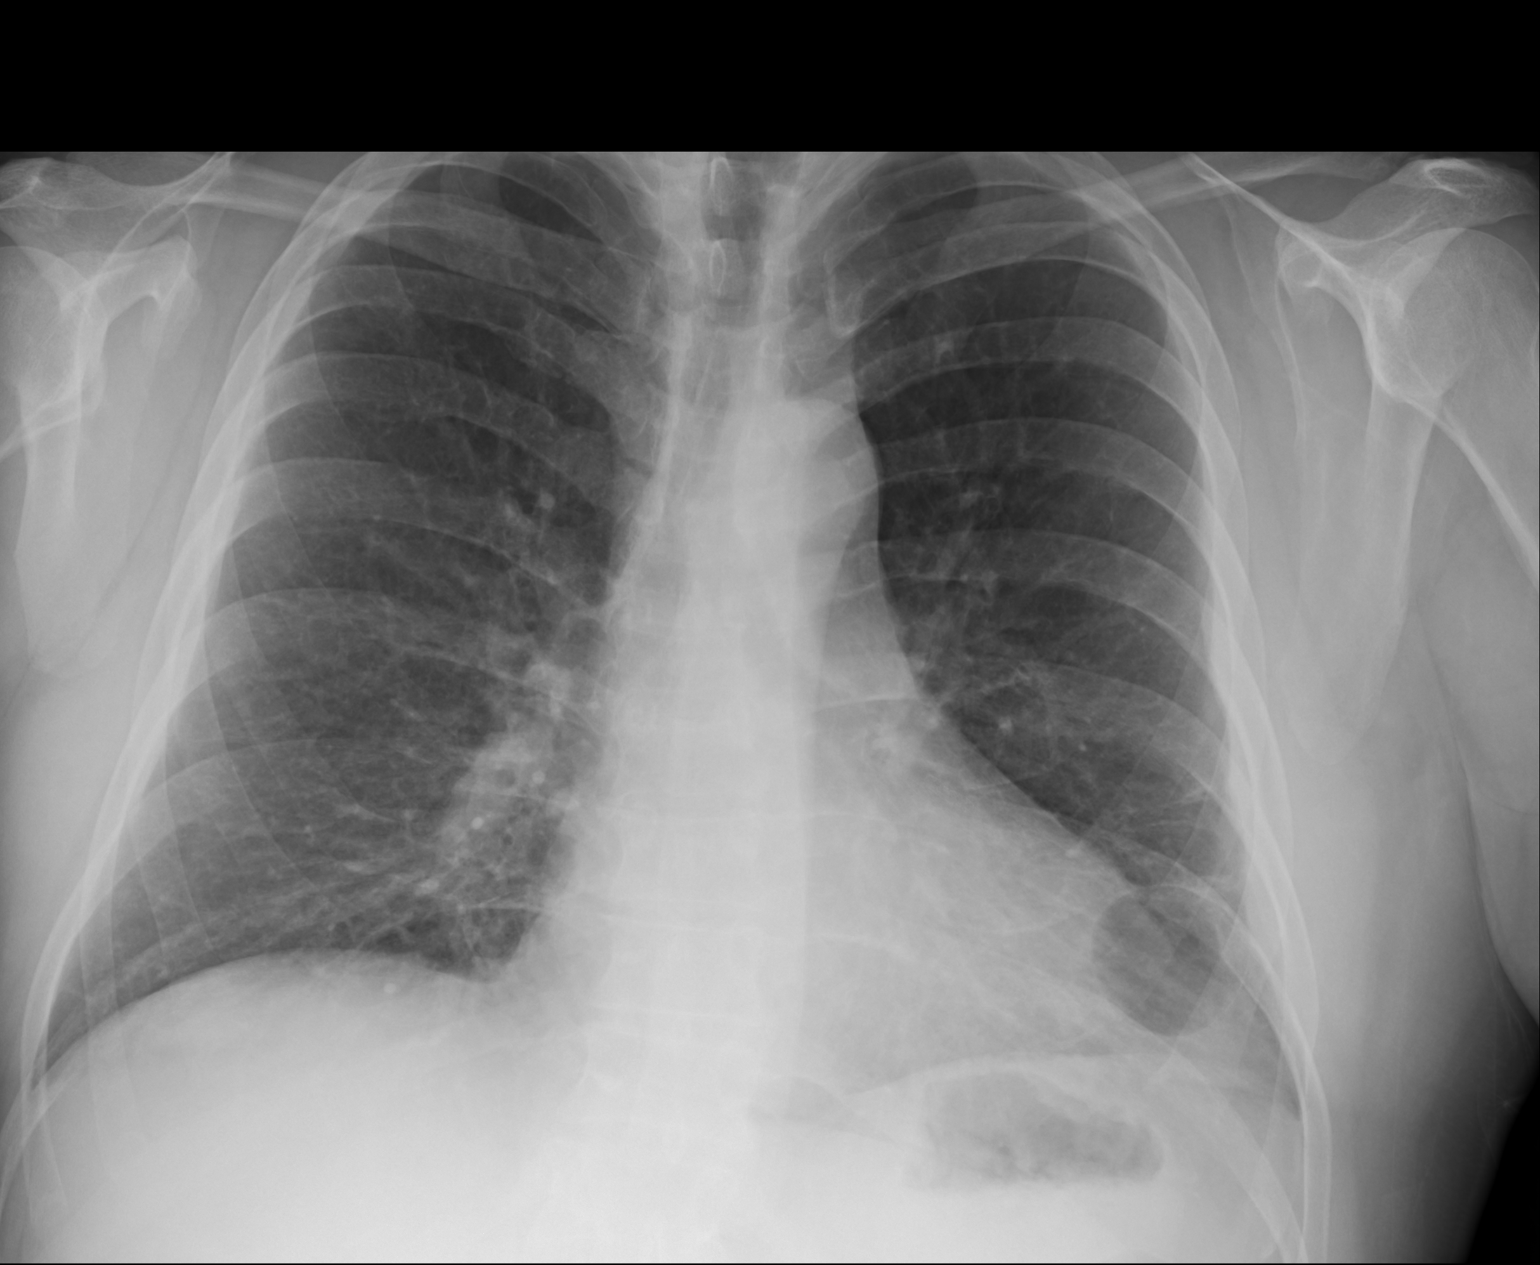
[im 2/2]
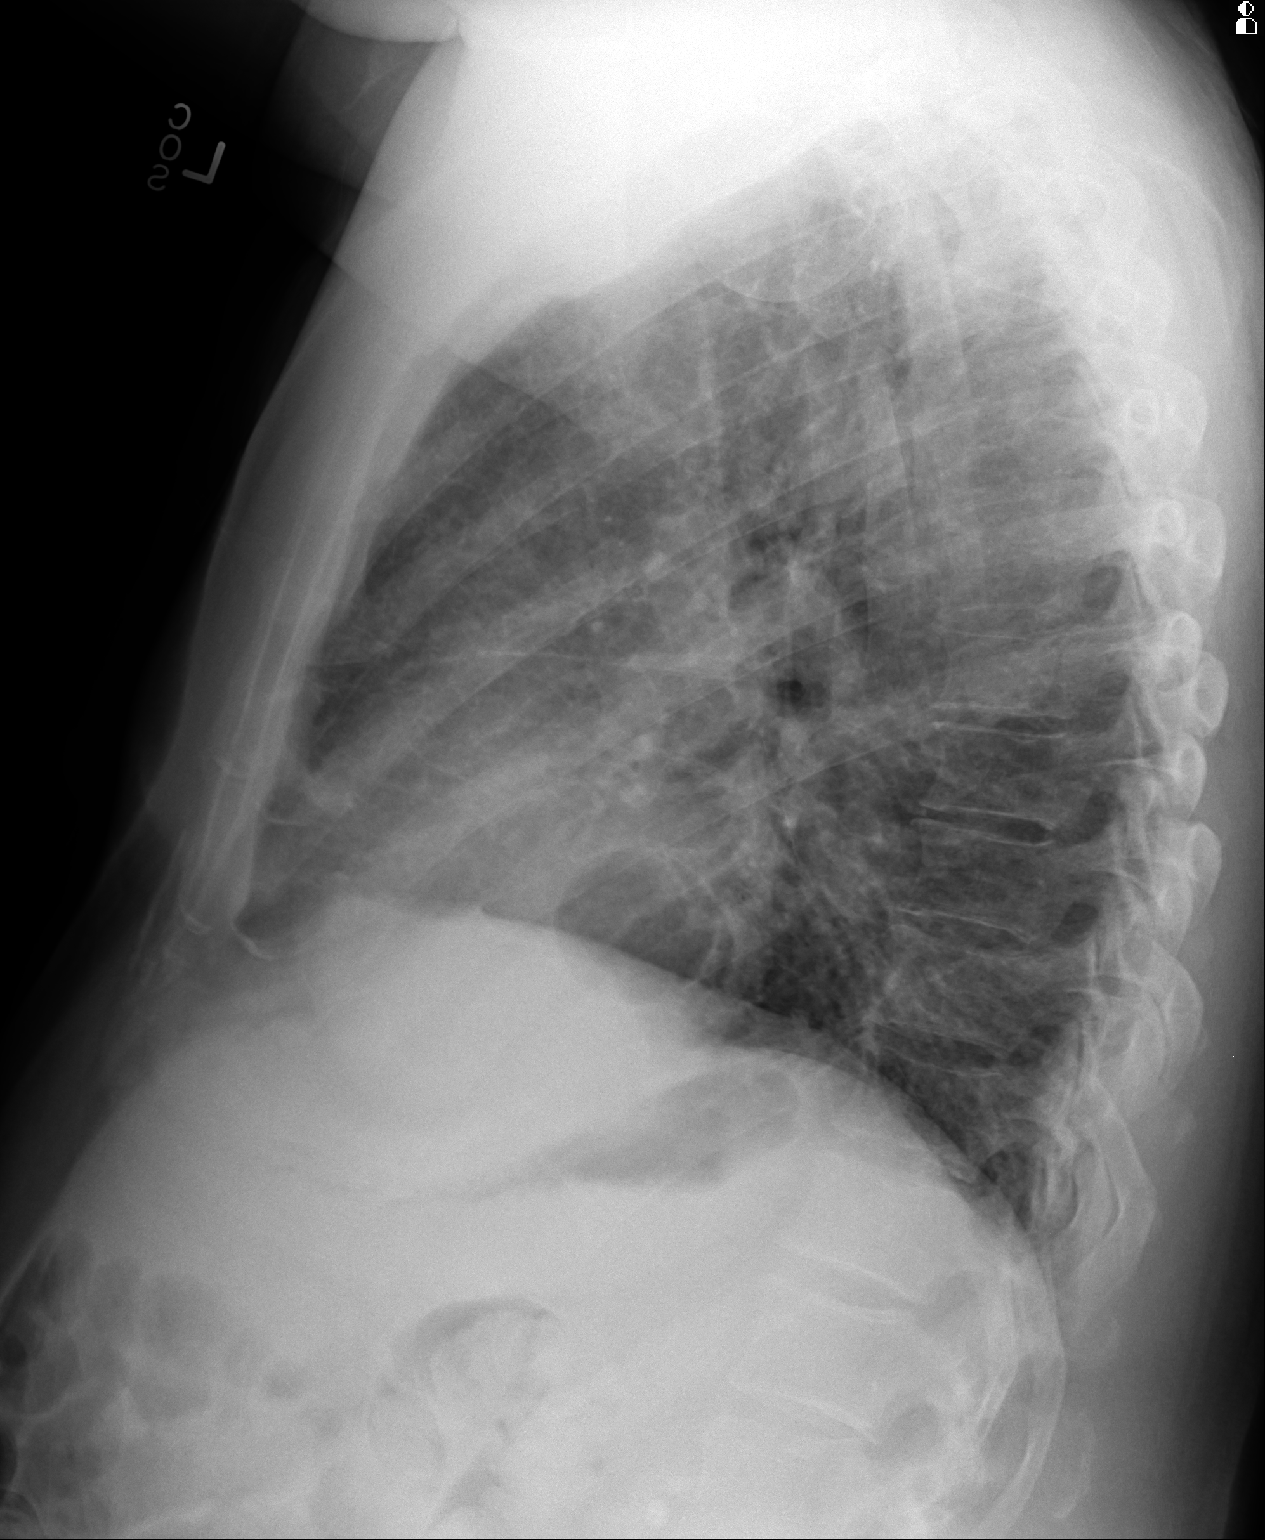

[2 of 2 positions shown; findings below may reference images not displayed]

FINDINGS: Bullous changes again noted in the inferior segment of the lingula.
Mild diffuse peribronchial cuffing is noted, however, this appears
to be chronic. No definite acute consolidative airspace disease. No
pleural effusions. No evidence of pulmonary edema. Heart size is
normal. Mediastinal contours are within normal limits.
IMPRESSION: 1. Diffuse peribronchial cuffing which appears to be chronic in this
patient. This is favored to represent chronic bronchitis, however,
clinical correlation for signs and symptoms of acute bronchitis is
recommended.
2. Emphysematous changes with bullous disease in the inferior
segment of the lingula, similar priors.

## 2013-09-21 ENCOUNTER — Emergency Department: Payer: Self-pay | Admitting: Emergency Medicine

## 2013-09-21 LAB — COMPREHENSIVE METABOLIC PANEL
ALBUMIN: 3.6 g/dL (ref 3.4–5.0)
Alkaline Phosphatase: 76 U/L
Anion Gap: 3 — ABNORMAL LOW (ref 7–16)
BUN: 15 mg/dL (ref 7–18)
Bilirubin,Total: 0.5 mg/dL (ref 0.2–1.0)
Calcium, Total: 8.3 mg/dL — ABNORMAL LOW (ref 8.5–10.1)
Chloride: 106 mmol/L (ref 98–107)
Co2: 30 mmol/L (ref 21–32)
Creatinine: 0.97 mg/dL (ref 0.60–1.30)
EGFR (African American): >60
EGFR (Non-African Amer.): >60
Glucose: 100 mg/dL — ABNORMAL HIGH (ref 65–99)
Osmolality: 278 (ref 275–301)
Potassium: 4.3 mmol/L (ref 3.5–5.1)
SGOT(AST): 15 U/L (ref 15–37)
SGPT (ALT): 24 U/L (ref 12–78)
Sodium: 139 mmol/L (ref 136–145)
TOTAL PROTEIN: 7.4 g/dL (ref 6.4–8.2)

## 2013-09-21 LAB — SEDIMENTATION RATE: Erythrocyte Sed Rate: 2 mm/hr (ref 0–15)

## 2013-09-21 LAB — CBC WITH DIFFERENTIAL/PLATELET
COMMENT - H1-COM2: NORMAL
Comment - H1-Com1: NORMAL
EOS PCT: 4 %
HCT: 45.9 % (ref 40.0–52.0)
HGB: 15.1 g/dL (ref 13.0–18.0)
Lymphocytes: 15 %
MCH: 30.8 pg (ref 26.0–34.0)
MCHC: 33 g/dL (ref 32.0–36.0)
MCV: 93 fL (ref 80–100)
MONOS PCT: 13 %
PLATELETS: 358 10*3/uL (ref 150–440)
RBC: 4.91 10*6/uL (ref 4.40–5.90)
RDW: 13.3 % (ref 11.5–14.5)
Segmented Neutrophils: 64 %
Variant Lymphocyte - H1-Rlymph: 4 %
WBC: 16.9 10*3/uL — ABNORMAL HIGH (ref 3.8–10.6)

## 2013-09-21 LAB — URIC ACID: Uric Acid: 5.9 mg/dL (ref 3.5–7.2)

## 2013-09-21 IMAGING — CR DG FOOT COMPLETE 3+V*L*
1 series · 3 of 3 positions shown · non-contrast
Comparison: None

CLINICAL DATA: Status post twisting injury

EXAM:
LEFT FOOT - COMPLETE 3+ VIEW

[Series 1: ap · 0.17mm/px · 3 of 3 slices shown]
[im 1/3]
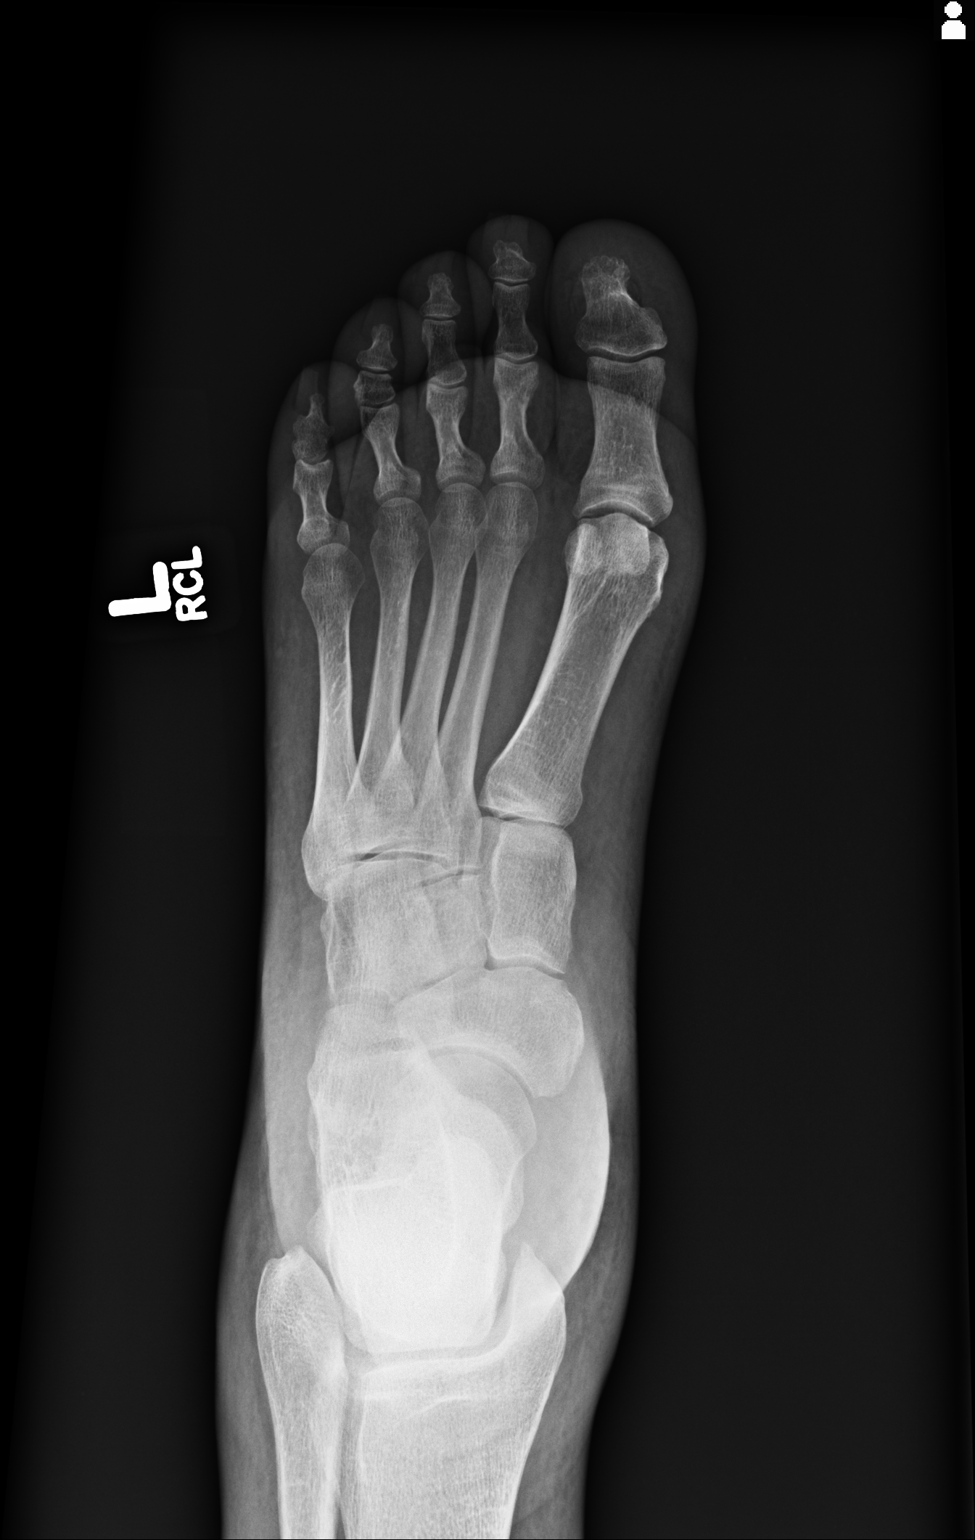
[im 2/3]
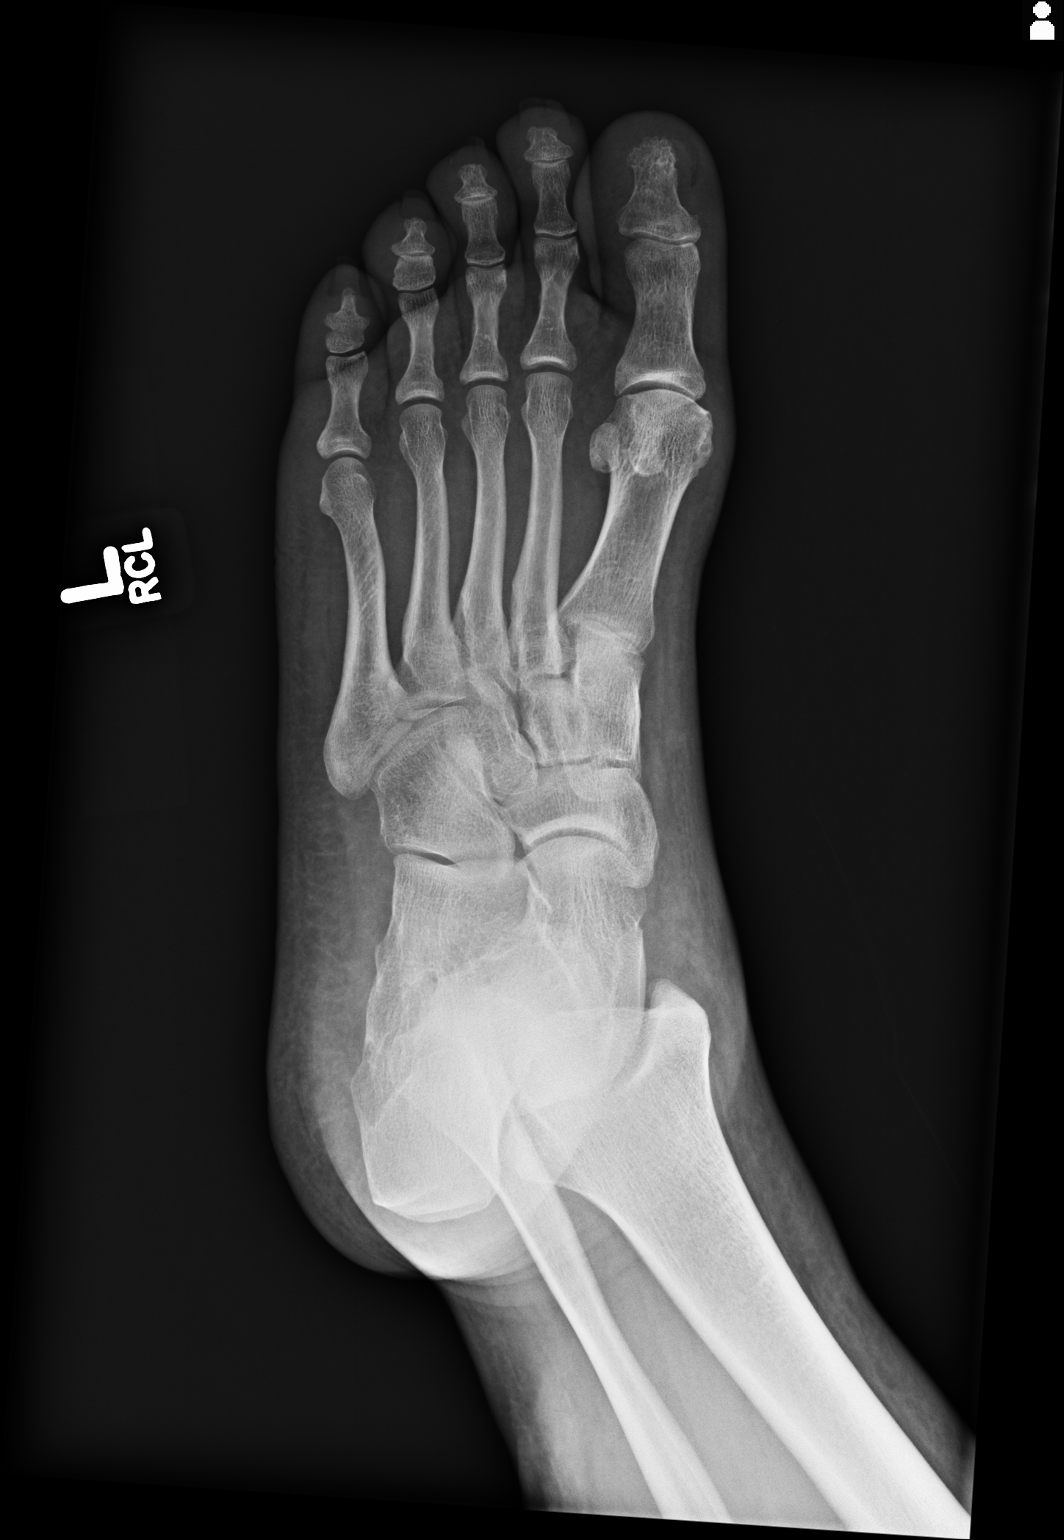
[im 3/3]
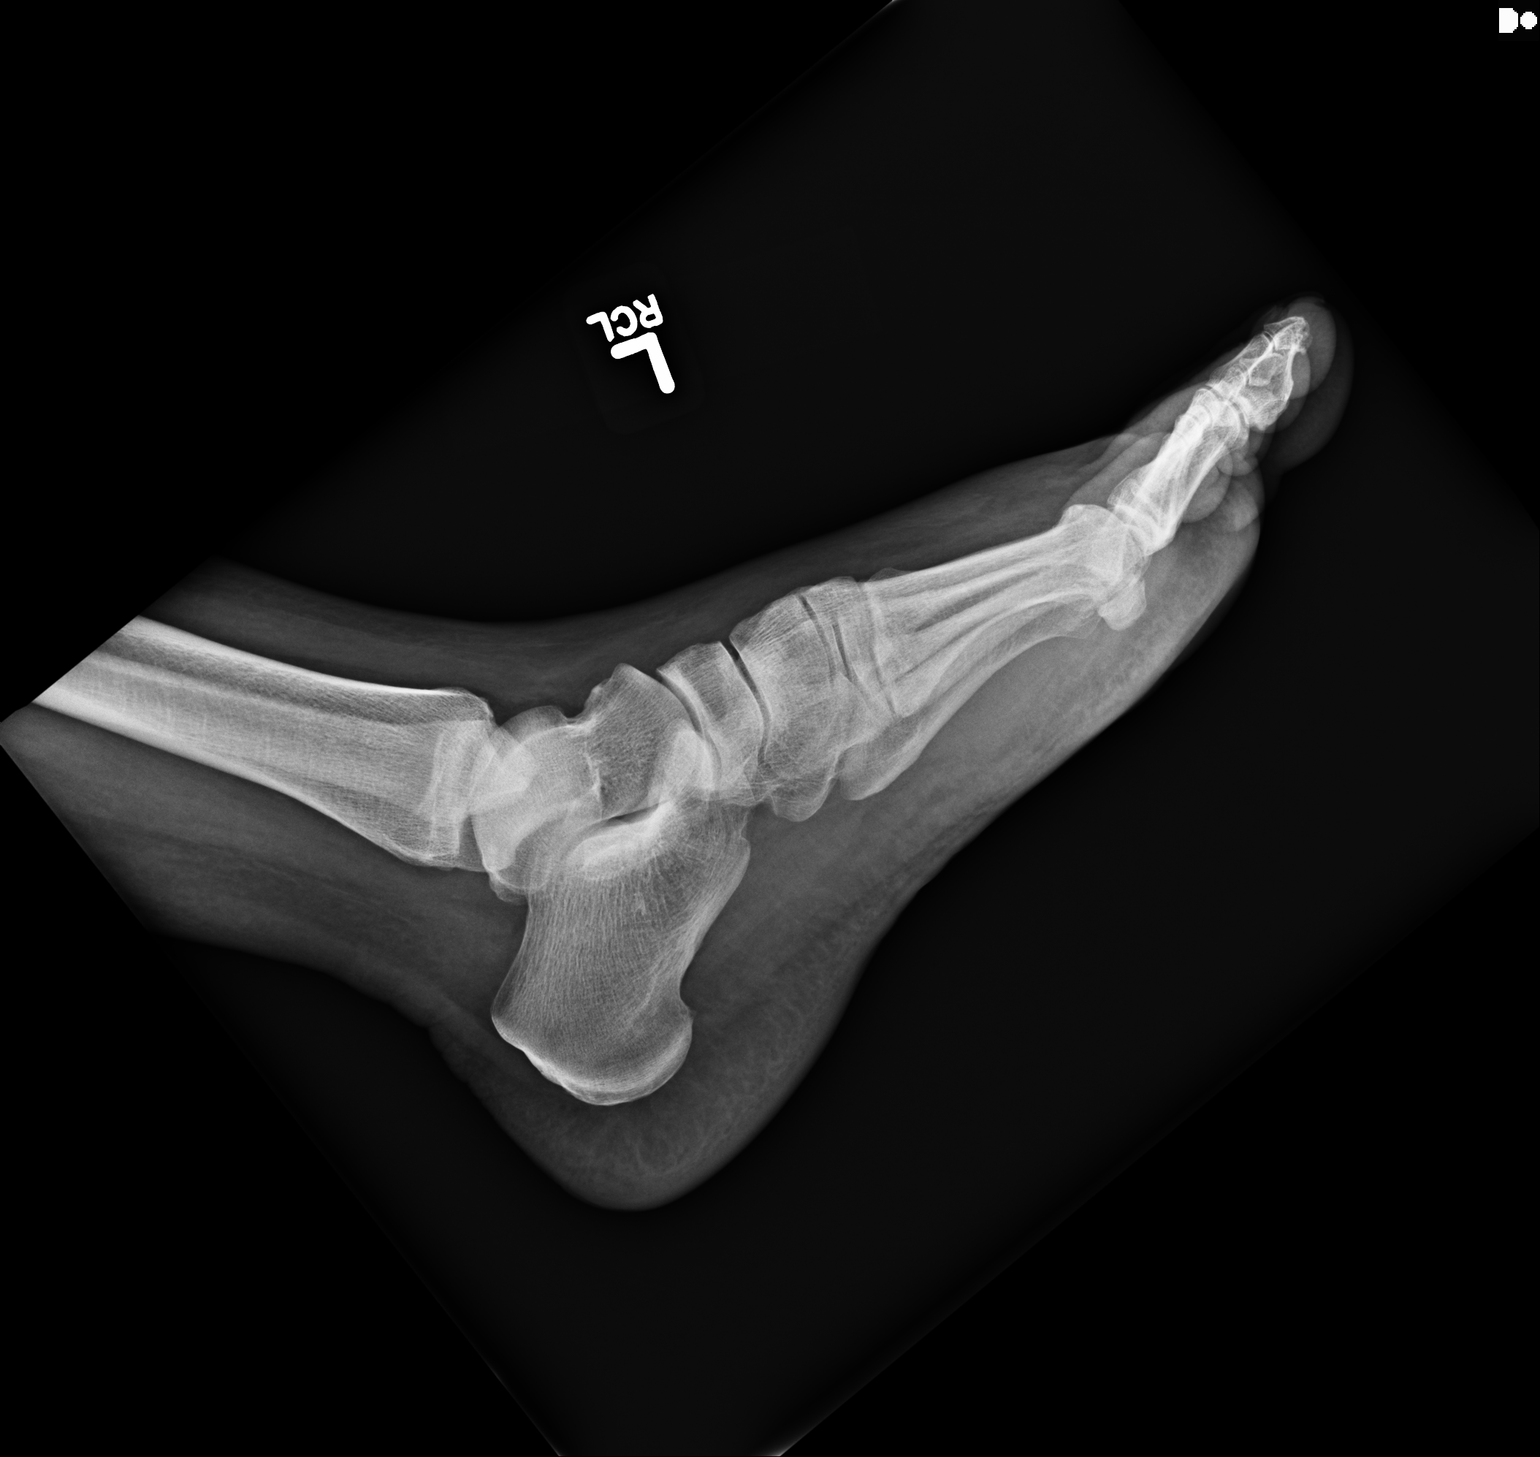

[3 of 3 positions shown; findings below may reference images not displayed]

FINDINGS: Three views of the left foot reveal the bones to be adequately
mineralized. There is no evidence of an acute fracture nor
dislocation. The interphalangeal joints and the metatarsophalangeal
joints are well maintained for age. The tarsometatarsal joints
appear normal. The bones of the hindfoot exhibit no acute
abnormalities. There is mild midfoot soft tissue swelling.
IMPRESSION: There is no acute bony abnormality of the left foot.

## 2013-12-02 ENCOUNTER — Emergency Department: Payer: Self-pay | Admitting: Emergency Medicine

## 2014-01-26 ENCOUNTER — Emergency Department: Payer: Self-pay | Admitting: Emergency Medicine

## 2014-04-03 ENCOUNTER — Emergency Department: Payer: Self-pay | Admitting: Emergency Medicine

## 2014-10-23 NOTE — H&P (Signed)
PATIENT NAME:  Brian BailiffDURHAM, Brian Porter MR#:  [id number] DATE OF BIRTH:  Jun 14, 1966  DATE OF ADMISSION:  05/29/2011  REFERRING PHYSICIAN:  Janalyn Harderavid Kaminski, MD   [facility]   INDICATION: Acute anterior wall myocardial infarction, STEMI, and malignant hypertension.   HISTORY OF PRESENT ILLNESS: Mr. Brian LimDurham is a 49 year old white male with a history of myocardial infarction, sleep apnea, hypertension, gastroesophageal reflux disease, obesity, chronic pain syndrome, and smoking. The patient presented with significant chest pain symptoms and anginal symptoms. It started about an hour prior to presentation and started having midsternal chest pain with shortness of breath, weakness, fatigue, and numbness in his arm so he finally came to the emergency room for evaluation and care. He was brought to the emergency room and EKG suggested significant EKG changes anteriorly with reciprocal depression inferolaterally. Pain was 10/10 with shortness of breath. He has been somewhat noncompliant with some of his medications and again he continues to smoke.   REVIEW OF SYSTEMS: He denies blackout spells or syncope. He denies nausea or vomiting. He denies fevers, chills, or sweats. No weight loss or weight gain. No hemoptysis, hematemesis, or bright red blood per rectum. He complained of chest pain, weakness, and shortness of breath.   PAST MEDICAL HISTORY:  1. Myocardial infarction. 2. Coronary artery disease. 3. Obstructive sleep apnea. 4. Hypertension. 5. Obesity. 6. Gastroesophageal reflux disease. 7. Shortness of breath. 8. Smoking.   PAST SURGICAL HISTORY: 1. Cardiac catheterization. 2. Inguinal surgery. 3. Splenectomy. 4. Partial nephrectomy.   FAMILY HISTORY: Hypertension and coronary artery disease.   SOCIAL HISTORY: He works doing pressure washing. He smokes. He denies significant alcohol consumption   MEDICATIONS:  1. Ultram 50 mg every four hours p.r.n.  2. Motrin 800 mg  every eight hours. 3. Metoprolol 50 mg a day.  4. He should be on a statin but has not taken any.  DRUG ALLERGIES:  He states he has no known drug allergies.   PHYSICAL EXAMINATION:   VITAL SIGNS: Blood pressure initially was 170/100, pulse 85, respiratory rate 20, and afebrile.   HEENT: Normocephalic, atraumatic. Pupils equal and reactive to light.   NECK: Supple. He had mild jugular venous distention. No bruits. No adenopathy.   LUNGS: Bilateral rhonchi. No wheezing, rhonchi, or rales. Adequate air movement.   HEART: Regular rate and rhythm. Positive S4. Soft S3. PMI is displaced laterally. Systolic ejection murmur at the apex.   ABDOMEN: Examination is benign. Positive bowel sounds. No rebound tenderness.   EXTREMITIES: Examination is within normal limits. No significant cyanosis or clubbing. He has some mild edema.   NEUROLOGIC: Examination is grossly intact.   SKIN: Examination is normal.  LABS/STUDIES: Sodium 138, potassium 4.2, chloride 105, CO2 26, BUN and creatinine 17 and 0.93, and glucose 122. LFTs negative. Hemoglobin and hematocrit 15.9 and 47, white count 17, and platelet count 340. PT and INR were normal.   EKG: Normal sinus rhythm, left ventricular hypertrophy, and ST elevation anteriorly as well as laterally with reciprocal depressions inferiorly, rate of 90.   ASSESSMENT:  1. Acute anterior myocardial infarction involving the lateral segment; STEMI. 2. 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 3. Status post partial nephrectomy.  4. Hyperlipidemia.  5. Obesity.  6. Smoking.  7. Obstructive sleep apnea.   PLAN: Agree with admit emergently. Continue to follow up cardiac enzymes. The patient will need to have emergent cardiac catheterization with intention to treat, possibly with angioplasty and stenting. Would recommend follow cardiac  enzymes and place on Integrilin and anticoagulation with heparin. Nitrates would be helpful. Morphine can also  help with some pain. Advised the patient to quit smoking. We will place on a statin, probably Lipitor. We will definitively evaluate his cardiac anatomy with cardiac catheterization. We will consider evaluation of his renal arteries because of malignant hypertension, poorly controlled hypertension, and history of partial nephrectomy. Again advised the patient to quit smoking and recommend weight loss and exercise, but the initial therapy would be for cardiac catheterization with possible angioplasty and stenting. ____________________________ Bobbie Stack. Juliann Pares, MD ddc:slb D: 05/29/2011 13:19:28 ET (Entered as incorrect work type - 03) T: 05/29/2011 13:53:04 ET JOB#: [id number]  cc: Dwayne D. Juliann Pares, MD, <Dictator> Alwyn Pea MD ELECTRONICALLY SIGNED 07/05/2011 14:00

## 2014-12-10 ENCOUNTER — Emergency Department (HOSPITAL_COMMUNITY)
Admission: EM | Admit: 2014-12-10 | Discharge: 2014-12-10 | Disposition: A | Discharge status: Home / Self Care | Payer: Self-pay | Attending: Emergency Medicine | Admitting: Emergency Medicine

## 2014-12-10 ENCOUNTER — Encounter (HOSPITAL_COMMUNITY): Payer: Self-pay | Admitting: Emergency Medicine

## 2014-12-10 ENCOUNTER — Emergency Department (HOSPITAL_COMMUNITY): Payer: Self-pay

## 2014-12-10 DIAGNOSIS — R109 Unspecified abdominal pain: Secondary | ICD-10-CM

## 2014-12-10 DIAGNOSIS — R1084 Generalized abdominal pain: Secondary | ICD-10-CM | POA: Insufficient documentation

## 2014-12-10 DIAGNOSIS — R110 Nausea: Secondary | ICD-10-CM | POA: Insufficient documentation

## 2014-12-10 DIAGNOSIS — Z720 Tobacco use: Secondary | ICD-10-CM | POA: Insufficient documentation

## 2014-12-10 LAB — URINALYSIS, ROUTINE W REFLEX MICROSCOPIC
Bilirubin Urine: NEGATIVE
Glucose, UA: NEGATIVE mg/dL
Hgb urine dipstick: NEGATIVE
Ketones, ur: NEGATIVE mg/dL
Nitrite: NEGATIVE
Protein, ur: NEGATIVE mg/dL
Specific Gravity, Urine: 1.023 (ref 1.005–1.030)
Urobilinogen, UA: 1 mg/dL (ref 0.0–1.0)
pH: 6.5 (ref 5.0–8.0)

## 2014-12-10 LAB — COMPREHENSIVE METABOLIC PANEL
ALT: 17 U/L (ref 17–63)
AST: 16 U/L (ref 15–41)
Albumin: 3.5 g/dL (ref 3.5–5.0)
Alkaline Phosphatase: 69 U/L (ref 38–126)
Anion gap: 11 (ref 5–15)
BUN: 16 mg/dL (ref 6–20)
CO2: 24 mmol/L (ref 22–32)
Calcium: 8.8 mg/dL — ABNORMAL LOW (ref 8.9–10.3)
Chloride: 104 mmol/L (ref 101–111)
Creatinine, Ser: 0.91 mg/dL (ref 0.61–1.24)
GFR calc Af Amer: >60 mL/min (ref >60)
GFR calc non Af Amer: >60 mL/min (ref >60)
Glucose, Bld: 146 mg/dL — ABNORMAL HIGH (ref 65–99)
Potassium: 4 mmol/L (ref 3.5–5.1)
Sodium: 139 mmol/L (ref 135–145)
Total Bilirubin: 0.9 mg/dL (ref 0.3–1.2)
Total Protein: 6.9 g/dL (ref 6.5–8.1)

## 2014-12-10 LAB — CBC WITH DIFFERENTIAL/PLATELET
Basophils Absolute: 0 10*3/uL (ref 0.0–0.1)
Basophils Relative: 0 % (ref 0–1)
Eosinophils Absolute: 0.6 10*3/uL (ref 0.0–0.7)
Eosinophils Relative: 4 % (ref 0–5)
HCT: 44.4 % (ref 39.0–52.0)
Hemoglobin: 15.1 g/dL (ref 13.0–17.0)
Lymphocytes Relative: 8 % — ABNORMAL LOW (ref 12–46)
Lymphs Abs: 1.1 10*3/uL (ref 0.7–4.0)
MCH: 31.3 pg (ref 26.0–34.0)
MCHC: 34 g/dL (ref 30.0–36.0)
MCV: 92.1 fL (ref 78.0–100.0)
Monocytes Absolute: 1.4 10*3/uL — ABNORMAL HIGH (ref 0.1–1.0)
Monocytes Relative: 10 % (ref 3–12)
Neutro Abs: 11.1 10*3/uL — ABNORMAL HIGH (ref 1.7–7.7)
Neutrophils Relative %: 78 % — ABNORMAL HIGH (ref 43–77)
Platelets: 356 10*3/uL (ref 150–400)
RBC: 4.82 MIL/uL (ref 4.22–5.81)
RDW: 13.8 % (ref 11.5–15.5)
WBC: 14.2 10*3/uL — ABNORMAL HIGH (ref 4.0–10.5)

## 2014-12-10 LAB — URINE MICROSCOPIC-ADD ON

## 2014-12-10 LAB — LIPASE, BLOOD: Lipase: 28 U/L (ref 22–51)

## 2014-12-10 IMAGING — CT CT ABD-PELV W/ CM
1 of 5 series · 14 of 46 positions shown, 16 images · IV contrast (Iodine)
Comparison: No priors.

CLINICAL DATA: 49-year-old male with bilateral lower abdominal pain
since 1 p.m. today, with intermittent transient nausea.

EXAM:
CT ABDOMEN AND PELVIS WITH CONTRAST
TECHNIQUE: Multidetector CT imaging of the abdomen and pelvis was performed
using the standard protocol following bolus administration of
intravenous contrast.
CONTRAST:  80 mL of Omnipaque 300.

[Series 201: routine, idose (2) · axial · 0.90mm/px · z∈[+25,+470]mm · 14 of 101 slices shown, 16 images]
[im 6/101  soft-tissue]
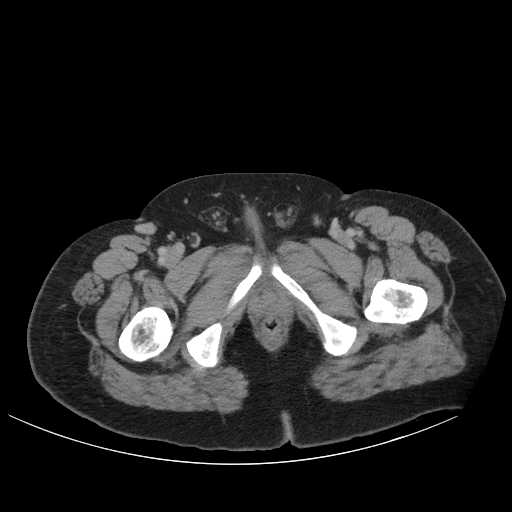
[im 6/101  bone]
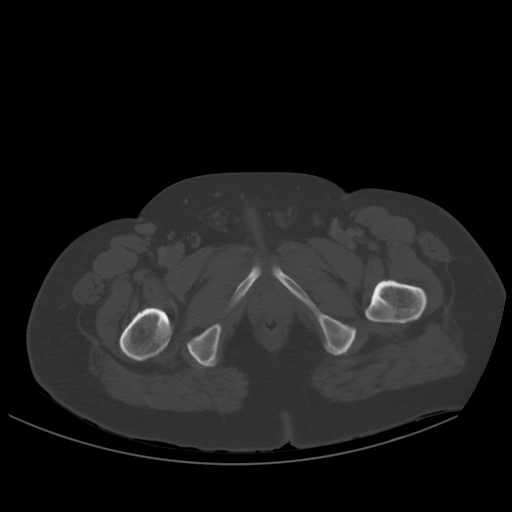
[im 12/101  soft-tissue]
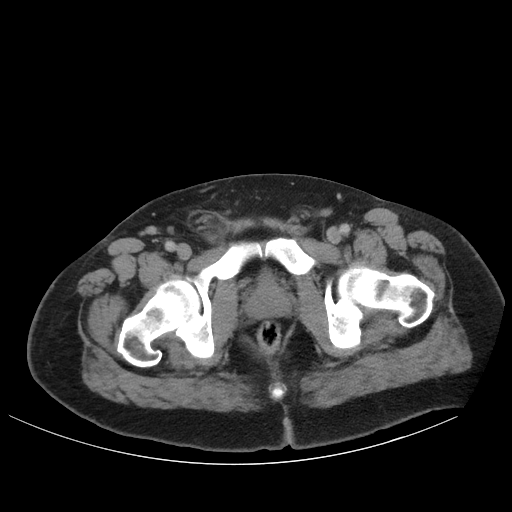
[im 18/101  soft-tissue]
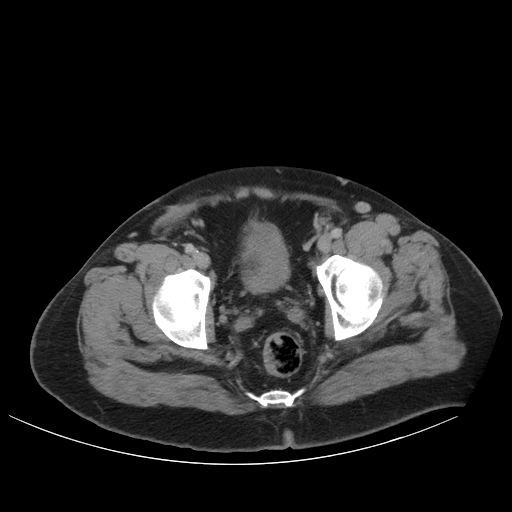
[im 30/101  soft-tissue]
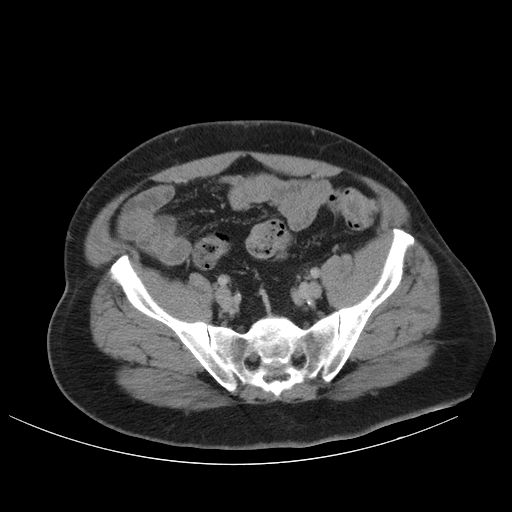
[im 36/101  soft-tissue]
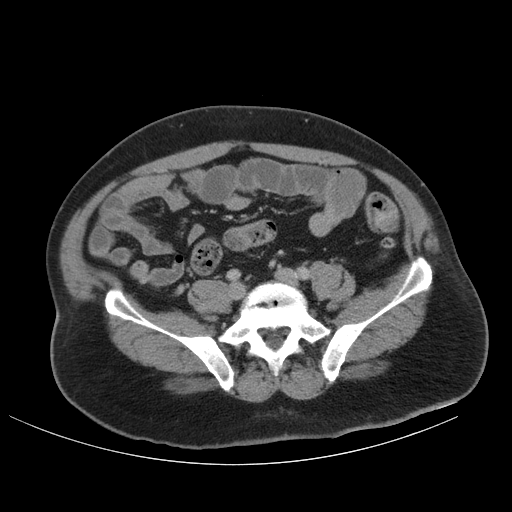
[im 42/101  soft-tissue]
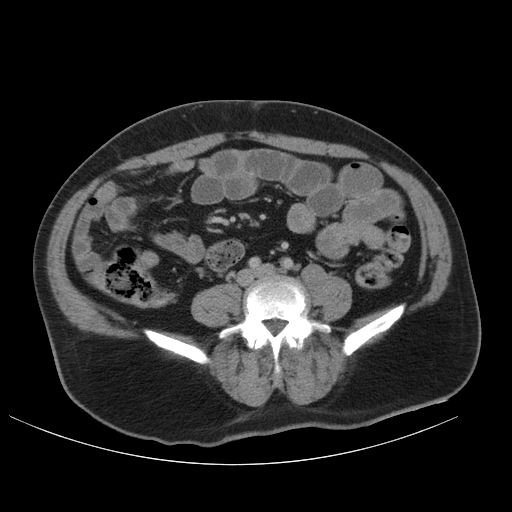
[im 48/101  soft-tissue]
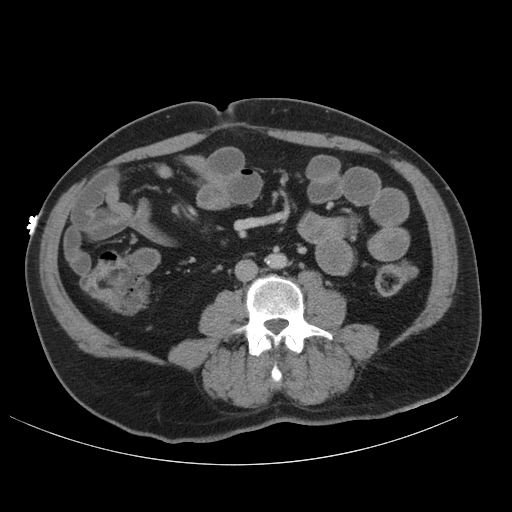
[im 53/101  soft-tissue]
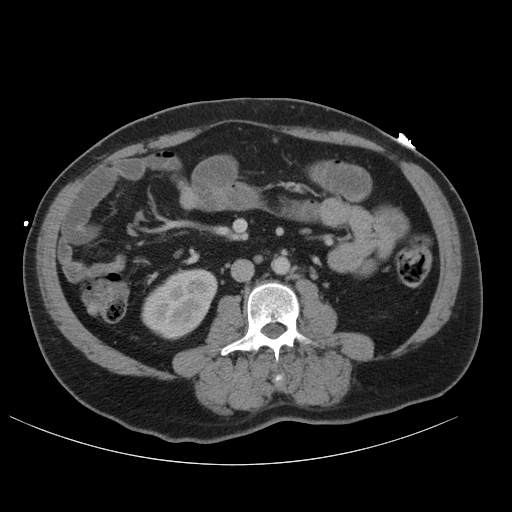
[im 59/101  soft-tissue]
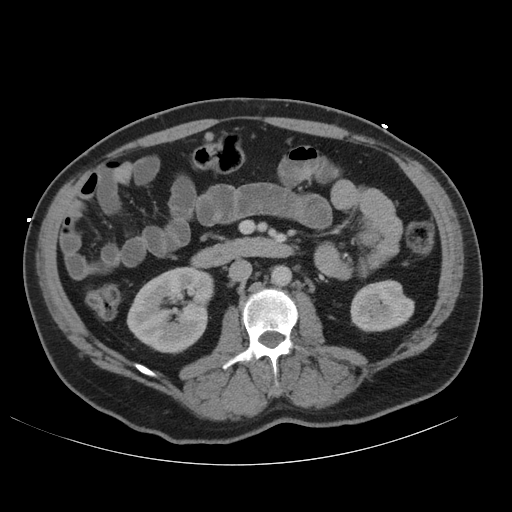
[im 59/101  bone]
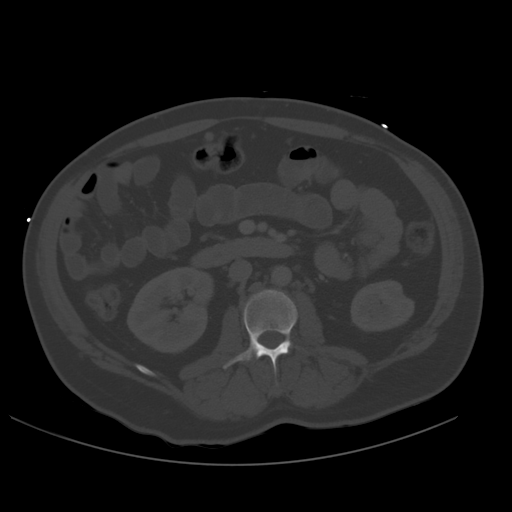
[im 65/101  soft-tissue]
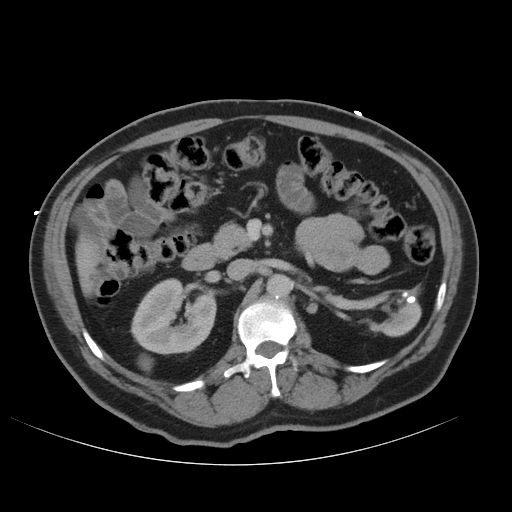
[im 77/101  soft-tissue]
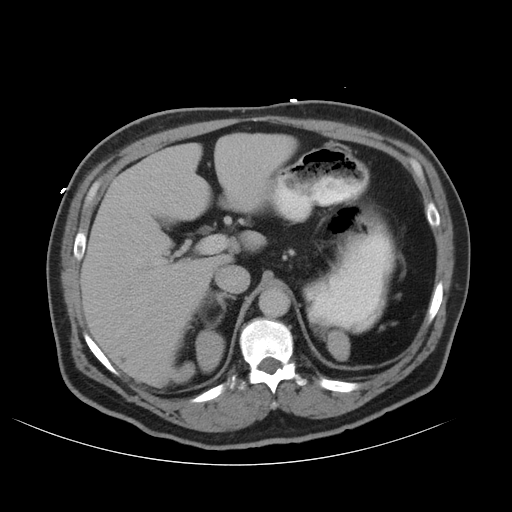
[im 83/101  soft-tissue]
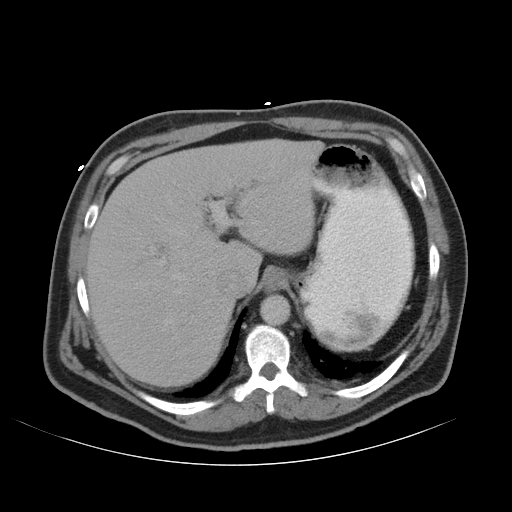
[im 89/101  soft-tissue]
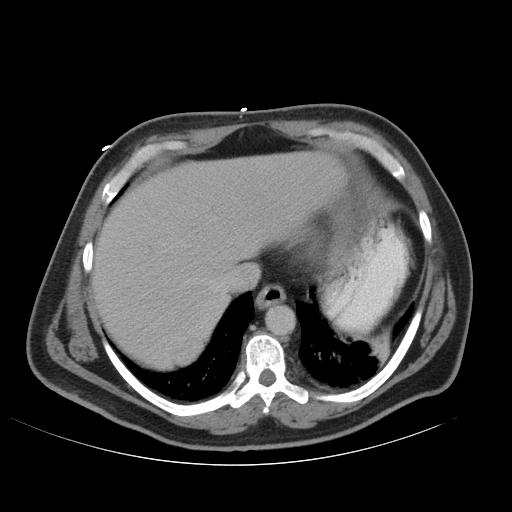
[im 95/101  soft-tissue]
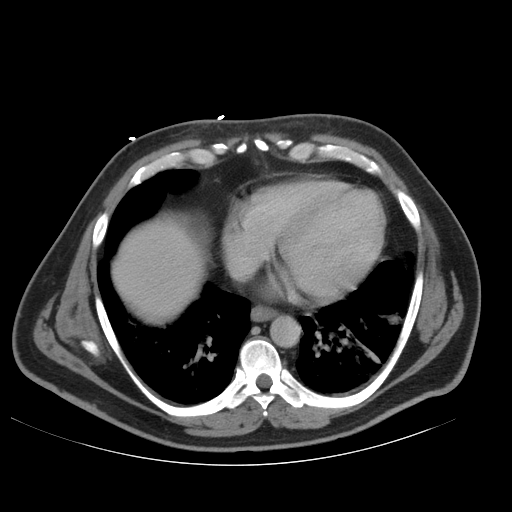

[14 of 46 positions shown; findings below may reference images not displayed]

FINDINGS: Lower chest: 14 x 15 mm nodular opacity in the inferior segment of
the lingula (image 7 of series 205). Dependent ground-glass
attenuation airspace disease in the left lower lobe. Probable and
like area of subsegmental atelectasis in the left lower lobe.
Curvilinear hypoattenuation in the mid ventricular and apical
interventricular septum, and the apical anterior wall of the left
ventricle, compatible with fibrofatty metaplasia from remote LAD
territory myocardial infarction. Myocardium appears thin throughout
these regions. Atherosclerotic calcifications in the distal left
circumflex coronary artery.

Hepatobiliary: Several tiny sub cm low-attenuation hepatic lesions
are noted, but are too small to characterize (statistically likely
tiny cysts). No larger aggressive appearing cystic or solid hepatic
lesions are noted. No intra or extrahepatic biliary ductal
dilatation. Gallbladder is normal in appearance.

Pancreas: No pancreatic mass. No pancreatic ductal dilatation. No
pancreatic or peripancreatic fluid or inflammatory changes.

Spleen: A normal spleen is not visualized and is either surgically
absent or congenitally absent. Notably, however, there are multiple
large soft tissue nodules/masses throughout the upper
retroperitoneum bilaterally (right greater than left), which are
well-defined and have imaging characteristics that are suggestive of
a benign process such as splenules. The largest of these is inferior
to the right lobe of the liver and measures approximately 4.1 x
x 6.7 cm (image 30 of series 201).

Adrenals/Urinary Tract: Right kidney and bilateral adrenal glands
are normal in appearance. Scarring in the lateral aspect of the left
kidney where there is cortical thinning and 2 small calcifications,
which could be dystrophic, or could represent nonobstructive
calculi, largest of which measures 7 mm. No hydroureteronephrosis.
Urinary bladder is normal in appearance.

Stomach/Bowel: Normal appearance of the stomach. No pathologic
dilatation of small bowel or colon. Normal appendix.

Vascular/Lymphatic: Atherosclerosis throughout the abdominal and
pelvic vasculature, without evidence of aneurysm or dissection.
Numerous prominent but nonenlarged retroperitoneal lymph nodes are
nonspecific. Several borderline enlarged and mildly enlarged
inguinal lymph nodes bilaterally measuring up to 12 mm.

Reproductive: Prostate gland and seminal vesicles are normal in
appearance.

Other: In addition to the retroperitoneal soft tissue nodules, there
are few other soft tissue nodules noted in the mesentery and
omentum, measuring up to 9 mm (image 44 of series 201), which are
nonspecific.

Musculoskeletal: There are no aggressive appearing lytic or blastic
lesions noted in the visualized portions of the skeleton.
IMPRESSION: 1. No acute findings in the abdomen or pelvis to account for the
patient's symptoms.
2. Very unusual appearance of multiple well-circumscribed enhancing
soft tissue nodules/masses which are predominantly located in the
retroperitoneum bilaterally (right greater than left), as well as in
the peritoneal cavity associated within the omentum. Given the lack
of normal spleen, these are favored to reflect multiple splenules,
or perhaps an alternate benign process such as extramedullary
hematopoiesis. This should be further evaluated with nonemergent
sulfur colloid scan in the near future to confirm the suspected
benign nature of this finding.
3. 14 x 15 mm nodular density in the inferior segment of the
lingula. No prior studies are available for comparison. This may
simply represent and nodular area of scarring, however, close
attention on repeat noncontrast chest CT in 3 months is recommended
to ensure the stability or resolution of this finding.
4. Multiple borderline enlarged and minimally enlarged bilateral
inguinal lymph nodes are nonspecific. No other lymphadenopathy is
noted elsewhere in the abdomen or pelvis.
5. Extensive atherosclerosis, with evidence of prior distal LAD
territory myocardial infarction, as above. Assessment for potential
risk factor modification, dietary therapy or pharmacologic therapy
may be warranted, if clinically indicated.
6. Additional incidental findings, as above.
These results were called by telephone at the time of interpretation
on [DATE] at [DATE] to Dr. PARATA, who verbally
acknowledged these results.

## 2014-12-10 MED ORDER — OXYCODONE-ACETAMINOPHEN 5-325 MG PO TABS
1.0000 | ORAL_TABLET | Freq: Once | ORAL | Status: AC
  Administered 2014-12-10: 1 via ORAL
  Filled 2014-12-10: qty 1

## 2014-12-10 MED ORDER — MORPHINE SULFATE 4 MG/ML IJ SOLN
4.0000 mg | Freq: Once | INTRAMUSCULAR | Status: AC
  Administered 2014-12-10: 4 mg via INTRAVENOUS
  Filled 2014-12-10: qty 1

## 2014-12-10 MED ORDER — ONDANSETRON 4 MG PO TBDP: 4.0000 mg | ORAL_TABLET | Freq: Three times a day (TID) | ORAL | Status: DC | PRN

## 2014-12-10 MED ORDER — IOHEXOL 300 MG/ML  SOLN
25.0000 mL | Freq: Once | INTRAMUSCULAR | Status: AC | PRN
Start: 2014-12-10 — End: 2014-12-10
  Administered 2014-12-10: 25 mL via ORAL

## 2014-12-10 MED ORDER — IOHEXOL 300 MG/ML  SOLN
80.0000 mL | Freq: Once | INTRAMUSCULAR | Status: AC | PRN
  Administered 2014-12-10: 100 mL via INTRAVENOUS

## 2014-12-10 MED ORDER — OXYCODONE-ACETAMINOPHEN 5-325 MG PO TABS: 1.0000 | ORAL_TABLET | ORAL | Status: DC | PRN

## 2014-12-10 MED ORDER — ONDANSETRON 4 MG PO TBDP
4.0000 mg | ORAL_TABLET | Freq: Once | ORAL | Status: AC
  Administered 2014-12-10: 4 mg via ORAL
  Filled 2014-12-10: qty 1

## 2014-12-10 NOTE — ED Notes (Signed)
Pt stable, ambulatory, states understanding of discharge instructions 

## 2014-12-10 NOTE — ED Notes (Signed)
Patient arrives with complaint of bilateral lower abdominal pain starting today around 1300. Denies vomiting but states some transient nausea. Denies history of lower abdominal of this severity. Denies seeing blood in stool.

## 2014-12-10 NOTE — Discharge Instructions (Signed)
Take the prescribed medication as directed. Follow-up with [facility].  It is important that you have a sulfur-colloid scan for follow-up to further evaluation the nodules they found in your abdomen on your CT scan today.  I have included copies of your scan for physician review. Return to the ED for new or worsening symptoms.

## 2014-12-10 NOTE — ED Provider Notes (Signed)
CSN: [id number]     Arrival date & time 12/10/14  1922 History   First MD Initiated Contact with Patient 12/10/14 1937     Chief Complaint  Patient presents with  . Abdominal Pain     (Consider location/radiation/quality/duration/timing/severity/associated sxs/prior Treatment) Patient is a 49 y.o. male presenting with abdominal pain. The history is provided by the patient and medical records.  Abdominal Pain Associated symptoms: nausea      This is a 49 year old male with no significant past medical history, presenting to the ED for abdominal pain. Patient states pain began this afternoon around 1300 and has been progressively worsening since this time. He reports some nausea and decreased appetite but denies vomiting. He denies any fever or chills. No urinary symptoms. States his bowel movements have been somewhat irregular recently. He denies any melena or hematochezia. 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 He denies any ongoing abdominal issues since this time. No intervention tried prior to arrival.  History reviewed. No pertinent past medical history. Past Surgical History  Procedure Laterality Date  . Splenectomy    . Partial nephrectomy Left    History reviewed. No pertinent family history. History  Substance Use Topics  . Smoking status: Current Every Day Smoker  . Smokeless tobacco: Not on file  . Alcohol Use: No    Review of Systems  Gastrointestinal: Positive for nausea and abdominal pain.  All other systems reviewed and are negative.     Allergies  Review of patient's allergies indicates no known allergies.  Home Medications   Prior to Admission medications   Not on File   BP 145/82 mmHg  Pulse 104  Temp(Src) 98.3 F (36.8 C) (Oral)  Resp 22  Ht  (1.778 m)  Wt 240 lb (108.863 kg)  BMI 34.44 kg/m2  SpO2 94%   Physical Exam   Constitutional: He is oriented to person, place, and time. He appears well-developed and well-nourished. No distress.  HENT:  Head: Normocephalic and atraumatic.  Mouth/Throat: Oropharynx is clear and moist.  Eyes: Conjunctivae and EOM are normal. Pupils are equal, round, and reactive to light.  Neck: Normal range of motion. Neck supple.  Cardiovascular: Normal rate, regular rhythm and normal heart sounds.   Pulmonary/Chest: Effort normal and breath sounds normal. No respiratory distress. He has no wheezes.  Abdominal: Soft. Bowel sounds are normal. There is generalized tenderness. There is no guarding.  Abdomen appears distended (possibly baseline) but remains soft; generalized tenderness but worse in LLQ; voluntary guarding noted; normal bowel sounds  Musculoskeletal: Normal range of motion. He exhibits no edema.  Neurological: He is alert and oriented to person, place, and time.  Skin: Skin is warm and dry. He is not diaphoretic.  Psychiatric: He has a normal mood and affect.  Nursing note and vitals reviewed.   ED Course  Procedures (including critical care time) Labs Review Labs Reviewed  CBC WITH DIFFERENTIAL/PLATELET - Abnormal; Notable for the following:    WBC 14.2 (*)    Neutrophils Relative % 78 (*)    Neutro Abs 11.1 (*)    Lymphocytes Relative 8 (*)    Monocytes Absolute 1.4 (*)    All other components within normal limits  COMPREHENSIVE METABOLIC PANEL - Abnormal; Notable for the following:    Glucose, Bld 146 (*)    Calcium 8.8 (*)    All other components within normal limits  URINALYSIS, ROUTINE W REFLEX MICROSCOPIC (NOT AT [facility]) - Abnormal; Notable for the following:    Leukocytes, UA TRACE (*)    All other components within normal limits  LIPASE, BLOOD  URINE MICROSCOPIC-ADD ON    Imaging Review Ct Abdomen Pelvis W Contrast  12/10/2014   CLINICAL DATA:  49 year old male with bilateral lower abdominal pain since 1 p.m. today, with intermittent transient  nausea.  EXAM: CT ABDOMEN AND PELVIS WITH CONTRAST  TECHNIQUE: Multidetector CT imaging of the abdomen and pelvis was performed using the standard protocol following bolus administration of intravenous contrast.  CONTRAST:  80 mL of Omnipaque 300.  COMPARISON:  No priors.  FINDINGS: Lower chest: 14 x 15 mm nodular opacity in the inferior segment of the lingula (image 7 of series 205). Dependent ground-glass attenuation airspace disease in the left lower lobe. Probable and like area of subsegmental atelectasis in the left lower lobe. 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 Myocardium appears thin throughout these regions. Atherosclerotic calcifications in the distal left circumflex coronary artery.  Hepatobiliary: Several tiny sub cm low-attenuation hepatic lesions are noted, but are too small to characterize (statistically likely tiny cysts). No larger aggressive appearing cystic or solid hepatic lesions are noted. No intra or extrahepatic biliary ductal dilatation. Gallbladder is normal in appearance.  Pancreas: No pancreatic mass. No pancreatic ductal dilatation. No pancreatic or peripancreatic fluid or inflammatory changes.  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 Notably, however, there are multiple large soft tissue nodules/masses throughout the upper retroperitoneum bilaterally (right greater than left), which are well-defined and have imaging characteristics that are suggestive of a benign process such as splenules. The largest of these is inferior to the right lobe of the liver and measures approximately 4.1 x 2.6 x 6.7 cm (image 30 of series 201).  Adrenals/Urinary Tract: Right kidney and bilateral adrenal glands are normal in appearance. Scarring in the lateral  aspect of the left kidney where there is cortical thinning and 2 small calcifications, which could be dystrophic, or could represent nonobstructive calculi, largest of which measures 7 mm. No hydroureteronephrosis. Urinary bladder is normal in appearance.  Stomach/Bowel: Normal appearance of the stomach. No pathologic dilatation of small bowel or colon. Normal appendix.  Vascular/Lymphatic: Atherosclerosis throughout the abdominal and pelvic vasculature, without evidence of aneurysm or dissection. Numerous prominent but nonenlarged retroperitoneal lymph nodes are nonspecific. Several borderline enlarged and mildly enlarged inguinal lymph nodes bilaterally measuring up to 12 mm.  Reproductive: Prostate gland and seminal vesicles are normal in appearance.  Other: In addition to the retroperitoneal soft tissue nodules, there are few other soft tissue nodules noted in the mesentery and omentum, measuring up to 9 mm (image 44 of series 201), which are nonspecific.  Musculoskeletal: There are no aggressive appearing lytic or blastic lesions noted in the visualized portions of the skeleton.  IMPRESSION: 1. No acute findings in the abdomen or pelvis to account for the patient's symptoms. 2. Very unusual appearance of multiple well-circumscribed enhancing soft tissue nodules/masses which are predominantly located in the retroperitoneum bilaterally (right greater than left), as well as in the peritoneal cavity associated within the omentum. Given the lack of normal spleen, these are favored to reflect multiple splenules, or perhaps an alternate benign process such as extramedullary hematopoiesis. This should be further evaluated with nonemergent sulfur colloid  scan in the near future to confirm the suspected benign nature of this finding. 3. 14 x 15 mm nodular density in the inferior segment of the lingula. No prior studies are available for comparison. This may simply represent and nodular area of scarring, however, close  attention on repeat noncontrast chest CT in 3 months is recommended to ensure the stability or resolution of this finding. 4. Multiple borderline enlarged and minimally enlarged bilateral inguinal lymph nodes are nonspecific. No other lymphadenopathy is noted elsewhere in the abdomen or pelvis. 5. Extensive atherosclerosis, with evidence of prior distal LAD territory myocardial infarction, as above. Assessment for potential risk factor modification, dietary therapy or pharmacologic therapy may be warranted, if clinically indicated. 6. Additional incidental findings, as above. These results were called by telephone at the time of interpretation on 12/10/2014 at 9:34 pm to Dr. Sharilyn Sites, who verbally acknowledged these results.   Electronically Signed   By: Trudie Reed M.D.   On: 12/10/2014 21:35     EKG Interpretation None      MDM   Final diagnoses:  Abdominal pain  Nausea   49 year old male here with lower abdominal pain that has been worsening since this afternoon. Some nausea without vomiting. Patient afebrile, nontoxic. He is in no acute distress. He has generalized tenderness of his abdomen but this does seem worse in his left lower quadrant. Lab work pending. Will proceed with CT scan for further evaluation.  Labwork is overall reassuring, slight leukocytosis.  CT scan with interesting finding of retroperitoneal nodules/masses bilaterally-- may reflect spenules vs extramedullary hematopoiesis, felt less likely to be neoplastic.  Non-emergent sulfur colloid scan recommended.  These results were discussed with patient, he acknowledged understanding and importance of follow-up. He does not have a PCP at this time. Attempted to involve case management to schedule follow-up appointment at [facility], however no one available this weekend.  Will have patient call clinic to schedule appt, copies of CT scan findings and lab work given to patient for physician review.  Rx percocet,  zofran.  Discussed plan with patient, he/she acknowledged understanding and agreed with plan of care.  Return precautions given for new or worsening symptoms.  Garlon Hatchet, PA-C 12/10/14 2202  Raeford Razor, MD 12/10/14 [phone]

## 2014-12-12 MED ORDER — IOHEXOL 300 MG/ML  SOLN
100.0000 mL | Freq: Once | INTRAMUSCULAR | Status: AC | PRN
  Administered 2014-12-12: 100 mL via INTRAVENOUS

## 2015-01-20 ENCOUNTER — Emergency Department (HOSPITAL_COMMUNITY)
Admission: EM | Admit: 2015-01-20 | Discharge: 2015-01-20 | Disposition: A | Discharge status: Home / Self Care | Payer: Self-pay | Attending: Emergency Medicine | Admitting: Emergency Medicine

## 2015-01-20 ENCOUNTER — Encounter (HOSPITAL_COMMUNITY): Payer: Self-pay | Admitting: Emergency Medicine

## 2015-01-20 DIAGNOSIS — Z720 Tobacco use: Secondary | ICD-10-CM | POA: Insufficient documentation

## 2015-01-20 DIAGNOSIS — Z79899 Other long term (current) drug therapy: Secondary | ICD-10-CM | POA: Insufficient documentation

## 2015-01-20 DIAGNOSIS — R51 Headache: Secondary | ICD-10-CM | POA: Insufficient documentation

## 2015-01-20 DIAGNOSIS — K088 Other specified disorders of teeth and supporting structures: Secondary | ICD-10-CM | POA: Insufficient documentation

## 2015-01-20 DIAGNOSIS — K029 Dental caries, unspecified: Secondary | ICD-10-CM | POA: Insufficient documentation

## 2015-01-20 DIAGNOSIS — H9209 Otalgia, unspecified ear: Secondary | ICD-10-CM | POA: Insufficient documentation

## 2015-01-20 MED ORDER — OXYCODONE-ACETAMINOPHEN 5-325 MG PO TABS: 1.0000 | ORAL_TABLET | Freq: Three times a day (TID) | ORAL | Status: DC | PRN

## 2015-01-20 MED ORDER — PENICILLIN V POTASSIUM 500 MG PO TABS: 500.0000 mg | ORAL_TABLET | Freq: Three times a day (TID) | ORAL | Status: DC

## 2015-01-20 MED ORDER — OXYCODONE-ACETAMINOPHEN 5-325 MG PO TABS
1.0000 | ORAL_TABLET | Freq: Once | ORAL | Status: AC
  Administered 2015-01-20: 1 via ORAL
  Filled 2015-01-20: qty 1

## 2015-01-20 NOTE — Discharge Instructions (Signed)
1. Medications: percocet, penicillin, usual home medications 2. Treatment: rest, drink plenty of fluids, take medications as prescribed 3. Follow Up: Please followup with dentistry within 1 week for discussion of your diagnoses and further evaluation after today's visit; if you do not have a primary care doctor use the resource guide provided to find one; Return to the ER for high fevers, difficulty breathing, difficulty swallowing or other concerning symptoms   Dental Pain A tooth ache may be caused by cavities (tooth decay). Cavities expose the nerve of the tooth to air and hot or cold temperatures. It may come from an infection or abscess (also called a boil or furuncle) around your tooth. It is also often caused by dental caries (tooth decay). This causes the pain you are having. DIAGNOSIS  Your caregiver can diagnose this problem by exam. TREATMENT   If caused by an infection, it may be treated with medications which kill germs (antibiotics) and pain medications as prescribed by your caregiver. Take medications as directed.  Only take over-the-counter or prescription medicines for pain, discomfort, or fever as directed by your caregiver.  Whether the tooth ache today is caused by infection or dental disease, you should see your dentist as soon as possible for further care. SEEK MEDICAL CARE IF: The exam and treatment you received today has been provided on an emergency basis only. This is not a substitute for complete medical or dental care. If your problem worsens or new problems (symptoms) appear, and you are unable to meet with your dentist, call or return to this location. SEEK IMMEDIATE MEDICAL CARE IF:   You have a fever.  You develop redness and swelling of your face, jaw, or neck.  You are unable to open your mouth.  You have severe pain uncontrolled by pain medicine. MAKE SURE YOU:   Understand these instructions.  Will watch your condition.  Will get help right away if  you are not doing well or get worse. Document Released: 06/17/2005 Document Revised: 09/09/2011 Document Reviewed: 02/03/2008 [facility] Patient Information 2015 [place], [place]. This information is not intended to replace advice given to you by your health care provider. Make sure you discuss any questions you have with your health care provider.    Emergency Department Resource Guide 1) Find a Doctor and Pay Out of Pocket Although you won't have to find out who is covered by your insurance plan, it is a good idea to ask around and get recommendations. You will then need to call the office and see if the doctor you have chosen will accept you as a new patient and what types of options they offer for patients who are self-pay. Some doctors offer discounts or will set up payment plans for their patients who do not have insurance, but you will need to ask so you aren't surprised when you get to your appointment.  2) Contact Your Local Health Department Not all health departments have doctors that can see patients for sick visits, but many do, so it is worth a call to see if yours does. If you don't know where your local health department is, you can check in your phone book. The CDC also has a tool to help you locate your state's health department, and many state websites also have listings of all of their local health departments.  3) Find a Walk-in Clinic If your illness is not likely to be very severe or complicated, you may want to try a walk in clinic. These are  popping up all over the country in pharmacies, drugstores, and shopping centers. They're usually staffed by nurse practitioners or physician assistants that have been trained to treat common illnesses and complaints. They're usually fairly quick and inexpensive. However, if you have serious medical issues or chronic medical problems, these are probably not your best option.  No Primary Care Doctor: - Call Health Connect at  [phone] - they  can help you locate a primary care doctor that  accepts your insurance, provides certain services, etc. - Physician Referral Service- [phone]  Chronic Pain Problems: Organization         Address  Phone   Notes  [facility]  [phone] Patients need to be referred by their primary care doctor.   Medication Assistance: Organization         Address  Phone   Notes  [facility] Medication [facility] [address] [place], [place] [address] [phone] --Must be a resident of [facility] -- Must have NO insurance coverage whatsoever (no Medicaid/ Medicare, etc.) -- The pt. MUST have a primary care doctor that directs their care regularly and follows them in the community   Med[facility]ist  [phone]   [employer]  [phone]    Agencies that provide inexpensive medical care: Organization         Address  Phone   Notes  [facility]  [phone]   [facility]    [phone]   [facility] [address] [place], [place] [address] [phone]   [facility] [address], [place] [phone]   Planned Parenthood    [phone]   [facility]    [phone]   [facility]  [address], [place] Phone:  [phone], Fax:  [phone] Hours of Operation:  9 am - 6 pm, M-F.  Also accepts Medicaid/Medicare and self-pay.  [facility]  [address], [place] Phone: [phone], Fax: [phone]. Hours of Operation:  8:30 am - 5:30 pm, M-F.  Also accepts Medicaid and self-pay.  [facility] [place] [address], [place] Phone: [phone]   [facility] [address] [place]asha [place], [place] [phone], Ext. [phone] Mondays & Thursdays: 7-9 AM.  First 15 patients are seen on a first come, first serve basis.    Medicaid-accepting  [place] Emergency Service Providers:  Organization         Address  Phone   Notes  [facility] [address], [place] [phone] Also accepts self-pay patients.  [facility] [address] Laurell Josephs [employer], [place]  [phone]   [facility] [address], [place] [phone]   [facility] [address], [place] [phone]   Renaye Rakers [address], [place]   [phone] Only accepts [place] patients after they have their name applied to their card.   Self-Pay (no insurance) in [facility]:  Organization         Address  Phone   Notes  Sickle Cell Patients, [facility] [address] [place], [place] [phone]   [facility] [address] [place], [place] [phone]   [facility] [place]  [address], [place] [phone]   [facility]/Dr. Osei-Bonsu  [address] [place] [address], [place] or [address], [place] [phone] Phone number for both [place] and [place] locations is the same.  [facility] [address], [place] [phone]   [facility] [address] [place] [address], [place] or [address] [phone]   [facility] [address], [place] [phone]) [phone], phone; [phone], fax Sees patients 1st and 3rd Saturday of every month.  Must not qualify for public or private insurance (i.e. Medicaid, Medicare, [place] Health Choice, Veterans' Benefits)  Household income should be no more than 200% of the poverty level The clinic cannot treat you if you are pregnant or think you are pregnant  Sexually transmitted diseases are not treated at the clinic.    Dental Care: Organization         Address  Phone  Notes  [facility]  [facility] [address], [place] [phone] Accepts children up to age 50 who are enrolled in [place]icaid or [place] Health Choice; pregnant women with a Medicaid card; and children who have applied for Medicaid or [place] Health Choice, but were declined, whose parents can pay a reduced fee at time of service.  [facility] [place]  [address], [place] [phone] Accepts children up to age 50 who are enrolled in [place] or [place] Health Choice; pregnant women with a Medicaid card; and children who have applied for Medicaid or [place] Health Choice, but were declined, whose parents can pay a reduced fee at time of service.  [facility]  [address], [place] [phone] Patients are seen by appointment only. Walk-ins are not accepted. [facility] will see patients 50 years of age and older. Monday - Tuesday (8am-5pm) Most Wednesdays (8:30-5pm) $30 per visit, cash only  [facility]  [address], [facility] [phone] Patients are seen by appointment only. Walk-ins are not accepted. [facility] will see patients 50 years of age and older. One Wednesday Evening (Monthly: Volunteer Based).  $30 per visit, cash only  [employer] [facility] [facility]  [phone] for adults; Children under age 494, call Graduate Pediatric Dentistry at [phone]. Children aged 354-14, please call [phone] to request a pediatric application.  Dental services are provided in all areas of dental care including fillings, crowns and bridges, complete and partial dentures, implants, gum treatment, root canals, and extractions. Preventive care is also provided. Treatment is provided to both adults and children. Patients are selected via a lottery and there is often a waiting list.   [facility] [address], [place]  [phone] [email or web address]   [facility] [address], [place], [place] [phone], Ext. [phone] Second and Fourth Thursday of each month, opens at 6:30 AM; Clinic ends at 9 AM.  Patients are seen on a first-come first-served basis, and a limited number are seen during each clinic.   [facility]  [address], [place], [place] [phone]) [phone]   Eligibility Requirements You must have lived in [place], [place], or [place] counties for at least the last three months.   You cannot be eligible for state or federal sponsored [place] Cityhealthcare insurance, including CIGNAVeterans Administration, [place]icaid,  or Medicare.   You generally cannot be eligible for healthcare insurance through your employer.    How to apply: Eligibility screenings are held every Tuesday and Wednesday afternoon from 1:00 pm until 4:00 pm. You do not need an appointment for the interview!  [facility] [address], [place], [place] [phone]   [facility]  [phone]   [facility]  [phone]   [facility]  [phone]    Behavioral Health Resources in the Community: Intensive Outpatient Programs Organization         Address  Phone  Notes  [facility] [address], [place], [place] [phone]   [facility] [address], [place], [place] [phone]   [facility] [address], [place], [place]  [phone]   [facility] [address],  [place], [place] [phone] or [phone]   Substance Abuse Resources Organization         Address  Phone  Notes  [facility] Drug Services  [phone]   [facility]  [phone]   [facility]  [phone]   [facility]  [phone]   Residential & Outpatient Substance Abuse Program  [phone]   Psychological Services Organization         Address  Phone  Notes  [facility]   [phone]   [facility]  [phone]- [phone]   [facility] [address], [place] [phone] or [phone]    Mobile Crisis Teams Organization         Address  Phone  Notes  [facility], Mobile Crisis Care Unit  [phone]   [facility]  [address] [place], [place] [phone]   Doristine LocksSharon DeEsch [address] [place] [phone]    Self-Help/Support Groups Organization         Address  Phone             Notes  [facility] [place] - variety of support groups  [phone] Call for more information  Narcotics Anonymous (NA), [facility]aring [facility] [address], [place]  2 meetings at this location   Statisticianesidential Treatment Programs Organization         Address  Phone  Notes  [facility] [address],    [place]  [phone]   [facility]  [address], [place] [phone], [place], [place] [phone]   [facility] [address], [place] [phone] Admissions: 8am-3pm M-F  [facility] [address]    [place], [place] [phone]   [facility] [address], [place], [place] [phone]   [facility] [address]  [place], [place] [phone]   [facility] - Intensive Outpatient [address], [place], [place] [phone]   [facility] ([facility] [address]  [place], [place] [phone] or [phone]   [facility] ([facility]) [address] [place], [place] [phone] Accepts Medicaid  [facility] [address]  [place] [phone] Substance Abuse/Addiction Treatment   [facility] Resources Organization         Address  Phone  Notes  [facility]  [phone]   Angie FavaJulie Brannon, PhD [address] [place] [place], [place]   [phone] or  [phone]   [facility]oses  [place], [place] [phone]   [facility] [address], [place], [place] [phone] Insurance/Medicaid/sponsorship through [facility] and Families [address] [place] [address], [address] [place], [place] [phone] [place], [place] [phone]    Dr. Adele Schilder  [phone]   [facility]. 1) [address], [place] 2) [place] 3)  [place] [address], [place] [phone]  [phone]   [place] [phone] or [phone] (After Hours)

## 2015-01-20 NOTE — ED Notes (Signed)
Declined W/C at D/C and was escorted to lobby by RN. 

## 2015-01-20 NOTE — ED Provider Notes (Signed)
CSN: [id number]     Arrival date & time 01/20/15  1314 History  This chart was scribed for non-physician practitioner, Dierdre Forth, PA-C working with Mancel Bale, MD by Gwenyth Ober, ED scribe. This patient was seen in room TR09C/TR09C and the patient's care was started at 2:14 PM   Chief Complaint  Patient presents with  . Dental Pain   The history is provided by the patient. No language interpreter was used.   HPI Comments: Brian Porter is a 49 y.o. male who presents to the Emergency Department complaining of constant, gradually worsening, moderate left upper dental pain associated with a dental fracture that occurred a few days ago. He reports mild jaw pain as an associated symptom. Pt tried applying an OTC partial filling to the affected tooth and 2 Ibuprofen with no relief. He does not have a dentist. Pt denies fever as an associated symptom.  History reviewed. No pertinent past medical history. Past Surgical History  Procedure Laterality Date  . Splenectomy    . Partial nephrectomy Left    No family history on file. History  Substance Use Topics  . Smoking status: Current Every Day Smoker  . Smokeless tobacco: Not on file  . Alcohol Use: No    Review of Systems  Constitutional: Negative for fever, chills and appetite change.  HENT: Positive for dental problem, ear pain and facial swelling. Negative for drooling, nosebleeds, postnasal drip, rhinorrhea and trouble swallowing.   Eyes: Negative for pain and redness.  Respiratory: Negative for cough and wheezing.   Cardiovascular: Negative for chest pain.  Gastrointestinal: Negative for nausea, vomiting and abdominal pain.  Musculoskeletal: Negative for neck pain and neck stiffness.  Skin: Negative for color change and rash.  Neurological: Positive for headaches. Negative for weakness and light-headedness.  All other systems reviewed and are negative.     Allergies  Review of patient's allergies indicates no  known allergies.  Home Medications   Prior to Admission medications   Medication Sig Start Date End Date Taking? Authorizing Provider  ibuprofen (ADVIL,MOTRIN) 200 MG tablet Take 400 mg by mouth every 6 (six) hours as needed for moderate pain.     Historical Provider, MD  lisinopril (PRINIVIL,ZESTRIL) 5 MG tablet Take 5 mg by mouth daily.    Historical Provider, MD  metoprolol tartrate (LOPRESSOR) 25 MG tablet Take 25 mg by mouth 2 (two) times daily.    Historical Provider, MD  ondansetron (ZOFRAN ODT) 4 MG disintegrating tablet Take 1 tablet (4 mg total) by mouth every 8 (eight) hours as needed for nausea. 12/10/14   Garlon Hatchet, PA-C  oxyCODONE-acetaminophen (PERCOCET) 5-325 MG per tablet Take 1 tablet by mouth every 8 (eight) hours as needed. 01/20/15   Estelle Skibicki, PA-C  penicillin v potassium (VEETID) 500 MG tablet Take 1 tablet (500 mg total) by mouth 3 (three) times daily. 01/20/15   Ileanna Gemmill, PA-C   BP 132/92 mmHg  Pulse 85  Temp(Src) 97.5 F (36.4 C) (Oral)  Resp 19  SpO2 96% Physical Exam  Constitutional: He appears well-developed and well-nourished.  HENT:  Head: Normocephalic.  Right Ear: Tympanic membrane, external ear and ear canal normal.  Left Ear: Tympanic membrane, external ear and ear canal normal.  Nose: Nose normal. Right sinus exhibits no maxillary sinus tenderness and no frontal sinus tenderness. Left sinus exhibits no maxillary sinus tenderness and no frontal sinus tenderness.  Mouth/Throat: Uvula is midline, oropharynx is clear and moist and mucous membranes are normal. No oral  lesions. Abnormal dentition. Dental caries present. No uvula swelling or lacerations. No oropharyngeal exudate, posterior oropharyngeal edema, posterior oropharyngeal erythema or tonsillar abscesses.  No gingival swelling, fluctuance or induration No gross abscess TTP of tooth #16, visible fracture, mild irritation to the buccal mucosa adjacent to the fracture site   Eyes: Conjunctivae are normal. Pupils are equal, round, and reactive to light. Right eye exhibits no discharge. Left eye exhibits no discharge.  Neck: Normal range of motion. Neck supple.  No stridor Handling secretions without difficulty No nuchal rigidity No cervical lymphadenopathy   Cardiovascular: Normal rate, regular rhythm and normal heart sounds.   Pulmonary/Chest: Effort normal. No respiratory distress.  Equal chest rise  Abdominal: Soft. Bowel sounds are normal. He exhibits no distension. There is no tenderness.  Lymphadenopathy:    He has no cervical adenopathy.  Neurological: He is alert.  Skin: Skin is warm and dry.  Psychiatric: He has a normal mood and affect.  Nursing note and vitals reviewed.   ED Course  Procedures   DIAGNOSTIC STUDIES: Oxygen Saturation is 97% on RA, normal by my interpretation.    COORDINATION OF CARE: 2:15 PM Discussed treatment plan with pt which includes referral to a dentist, penicillin and pain management. Pt refused a dental block. Pt agreed to plan.  Labs Review Labs Reviewed - No data to display  Imaging Review No results found.   EKG Interpretation None      MDM   Final diagnoses:  Pain due to dental caries   Brian Porter presents with toothache.  No gross abscess.  Exam unconcerning for Ludwig's angina or spread of infection.  Pt declines dental block.  Will treat with penicillin and pain medicine.  Urged patient to follow-up with dentist.    BP 132/92 mmHg  Pulse 85  Temp(Src) 97.5 F (36.4 C) (Oral)  Resp 19  SpO2 96%  I personally performed the services described in this documentation, which was scribed in my presence. The recorded information has been reviewed and is accurate.    Brian Client Oyinkansola Truax, PA-C 01/20/15 1452  Mancel Bale, MD 01/20/15 [phone]

## 2015-01-20 NOTE — ED Notes (Signed)
Pt. Stated, I have a cracked tooth for a couple of days.  I don't have a dentist.

## 2015-02-22 ENCOUNTER — Encounter (HOSPITAL_COMMUNITY): Payer: Self-pay | Admitting: *Deleted

## 2015-02-22 ENCOUNTER — Observation Stay (HOSPITAL_COMMUNITY)
Admission: EM | Admit: 2015-02-22 | Discharge: 2015-02-22 | Discharge status: Against Medical Advice | Payer: Self-pay | Attending: Internal Medicine | Admitting: Internal Medicine

## 2015-02-22 ENCOUNTER — Emergency Department (HOSPITAL_COMMUNITY): Payer: Self-pay

## 2015-02-22 DIAGNOSIS — Z79899 Other long term (current) drug therapy: Secondary | ICD-10-CM | POA: Insufficient documentation

## 2015-02-22 DIAGNOSIS — F17210 Nicotine dependence, cigarettes, uncomplicated: Secondary | ICD-10-CM | POA: Insufficient documentation

## 2015-02-22 DIAGNOSIS — I252 Old myocardial infarction: Secondary | ICD-10-CM | POA: Insufficient documentation

## 2015-02-22 DIAGNOSIS — Z716 Tobacco abuse counseling: Secondary | ICD-10-CM

## 2015-02-22 DIAGNOSIS — Z6834 Body mass index (BMI) 34.0-34.9, adult: Secondary | ICD-10-CM | POA: Insufficient documentation

## 2015-02-22 DIAGNOSIS — E669 Obesity, unspecified: Secondary | ICD-10-CM | POA: Insufficient documentation

## 2015-02-22 DIAGNOSIS — D72829 Elevated white blood cell count, unspecified: Secondary | ICD-10-CM | POA: Insufficient documentation

## 2015-02-22 DIAGNOSIS — K219 Gastro-esophageal reflux disease without esophagitis: Secondary | ICD-10-CM | POA: Insufficient documentation

## 2015-02-22 DIAGNOSIS — I2510 Atherosclerotic heart disease of native coronary artery without angina pectoris: Secondary | ICD-10-CM | POA: Insufficient documentation

## 2015-02-22 DIAGNOSIS — I10 Essential (primary) hypertension: Secondary | ICD-10-CM | POA: Insufficient documentation

## 2015-02-22 DIAGNOSIS — R079 Chest pain, unspecified: Principal | ICD-10-CM | POA: Insufficient documentation

## 2015-02-22 DIAGNOSIS — Z79891 Long term (current) use of opiate analgesic: Secondary | ICD-10-CM | POA: Insufficient documentation

## 2015-02-22 DIAGNOSIS — E780 Pure hypercholesterolemia: Secondary | ICD-10-CM | POA: Insufficient documentation

## 2015-02-22 DIAGNOSIS — Z955 Presence of coronary angioplasty implant and graft: Secondary | ICD-10-CM | POA: Insufficient documentation

## 2015-02-22 DIAGNOSIS — Z792 Long term (current) use of antibiotics: Secondary | ICD-10-CM | POA: Insufficient documentation

## 2015-02-22 DIAGNOSIS — Z9114 Patient's other noncompliance with medication regimen: Secondary | ICD-10-CM | POA: Insufficient documentation

## 2015-02-22 DIAGNOSIS — M545 Low back pain: Secondary | ICD-10-CM | POA: Insufficient documentation

## 2015-02-22 DIAGNOSIS — Z791 Long term (current) use of non-steroidal anti-inflammatories (NSAID): Secondary | ICD-10-CM | POA: Insufficient documentation

## 2015-02-22 DIAGNOSIS — Z8249 Family history of ischemic heart disease and other diseases of the circulatory system: Secondary | ICD-10-CM | POA: Insufficient documentation

## 2015-02-22 HISTORY — DX: Essential (primary) hypertension: I10

## 2015-02-22 LAB — CBC
HCT: 45.8 % (ref 39.0–52.0)
HEMOGLOBIN: 15.3 g/dL (ref 13.0–17.0)
MCH: 31.3 pg (ref 26.0–34.0)
MCHC: 33.4 g/dL (ref 30.0–36.0)
MCV: 93.7 fL (ref 78.0–100.0)
Platelets: 355 10*3/uL (ref 150–400)
RBC: 4.89 MIL/uL (ref 4.22–5.81)
RDW: 13.9 % (ref 11.5–15.5)
WBC: 18.2 10*3/uL — ABNORMAL HIGH (ref 4.0–10.5)

## 2015-02-22 LAB — BASIC METABOLIC PANEL
Anion gap: 8 (ref 5–15)
BUN: 13 mg/dL (ref 6–20)
CHLORIDE: 102 mmol/L (ref 101–111)
CO2: 28 mmol/L (ref 22–32)
CREATININE: 1.01 mg/dL (ref 0.61–1.24)
Calcium: 9 mg/dL (ref 8.9–10.3)
GFR calc Af Amer: >60 mL/min (ref >60)
GFR calc non Af Amer: >60 mL/min (ref >60)
GLUCOSE: 107 mg/dL — AB (ref 65–99)
Potassium: 3.9 mmol/L (ref 3.5–5.1)
SODIUM: 138 mmol/L (ref 135–145)

## 2015-02-22 LAB — I-STAT TROPONIN, ED
TROPONIN I, POC: 0.04 ng/mL (ref 0.00–0.08)
TROPONIN I, POC: 0.03 ng/mL (ref 0.00–0.08)

## 2015-02-22 IMAGING — DX DG CHEST 2V
2 series · 2 of 2 positions shown · non-contrast
Comparison: None

CLINICAL DATA: Burning sensation from anterior neck down to chest
since yesterday, history of coronary artery disease post MI and
stent placement, smoker, hypertension

EXAM:
CHEST  2 VIEW

[chest pa]
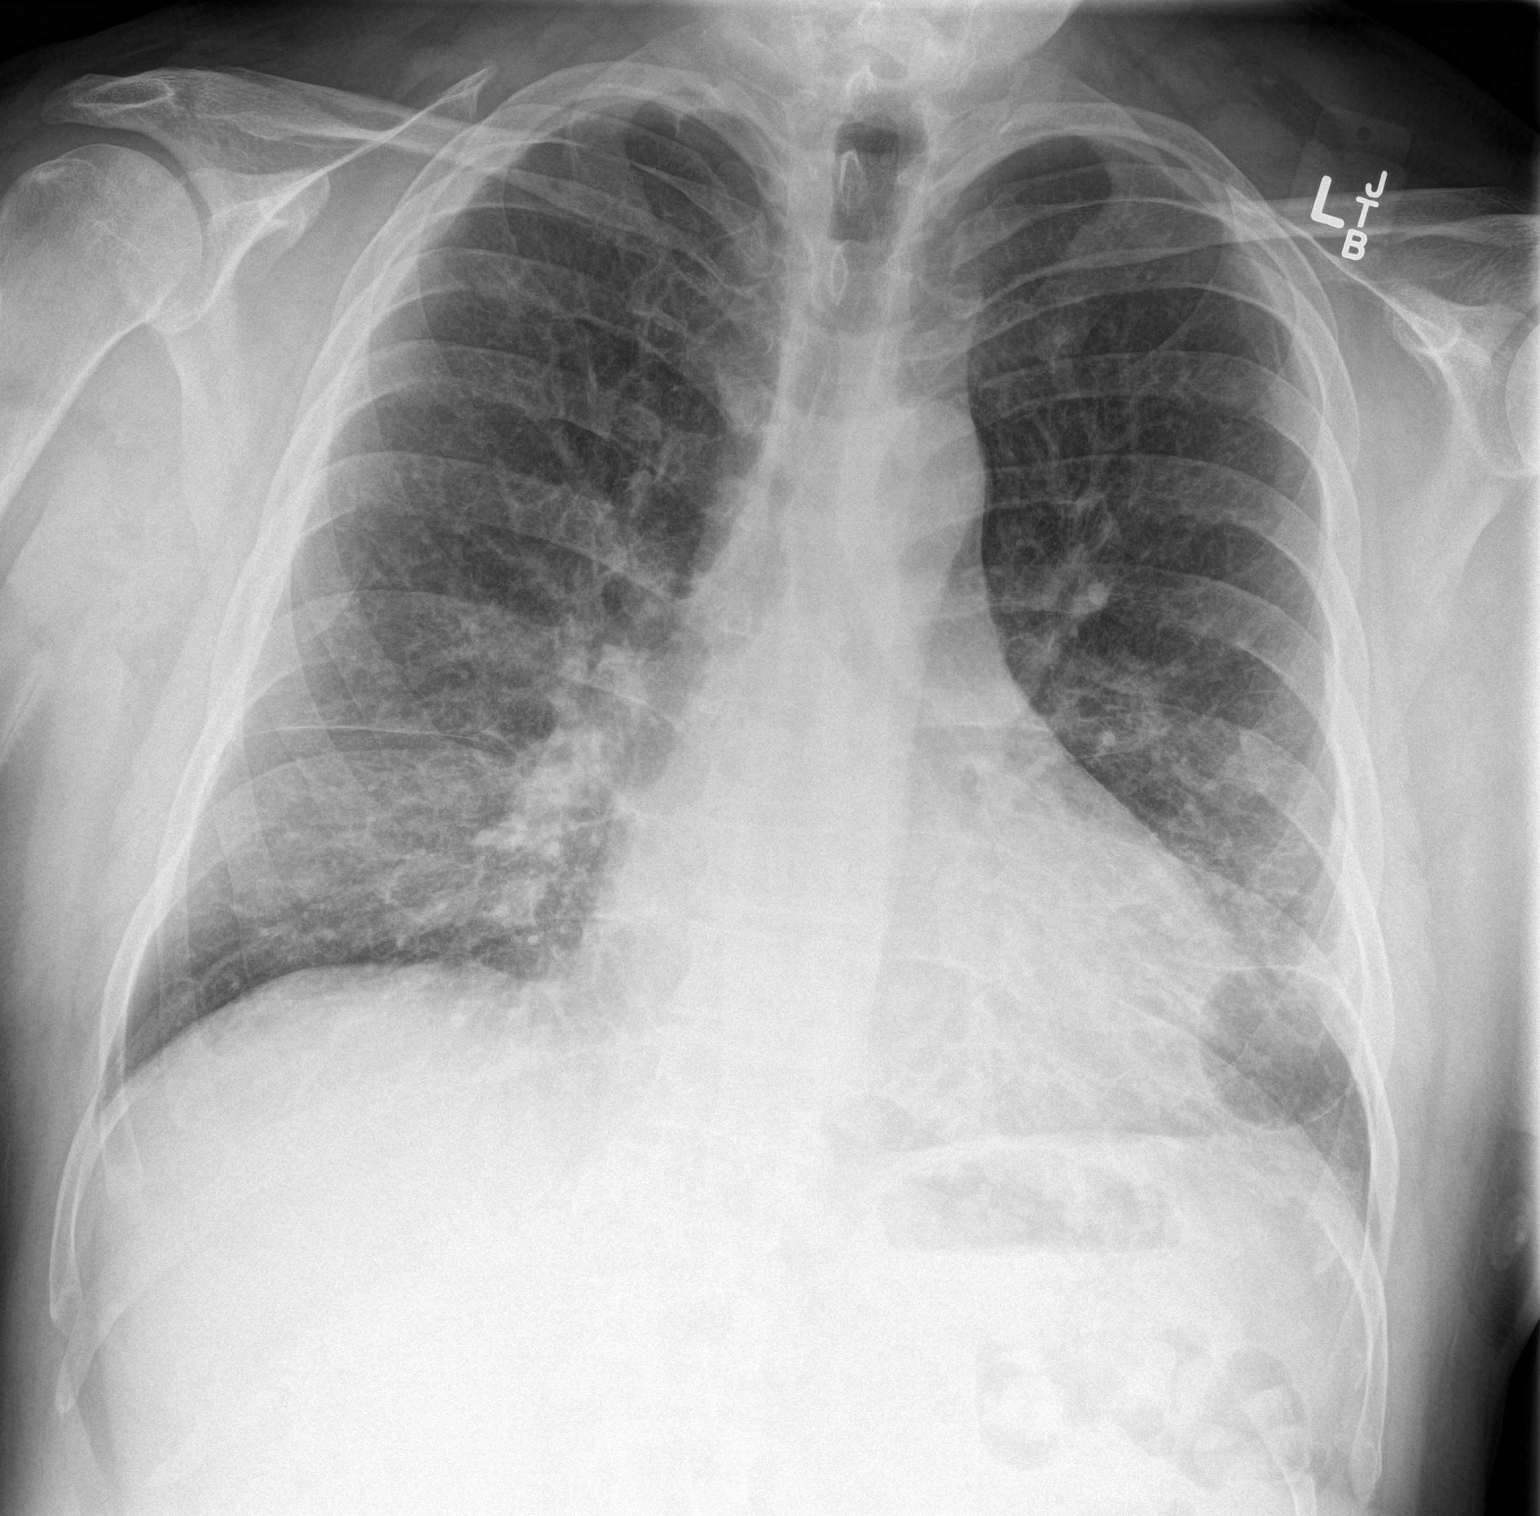

[chest lat]
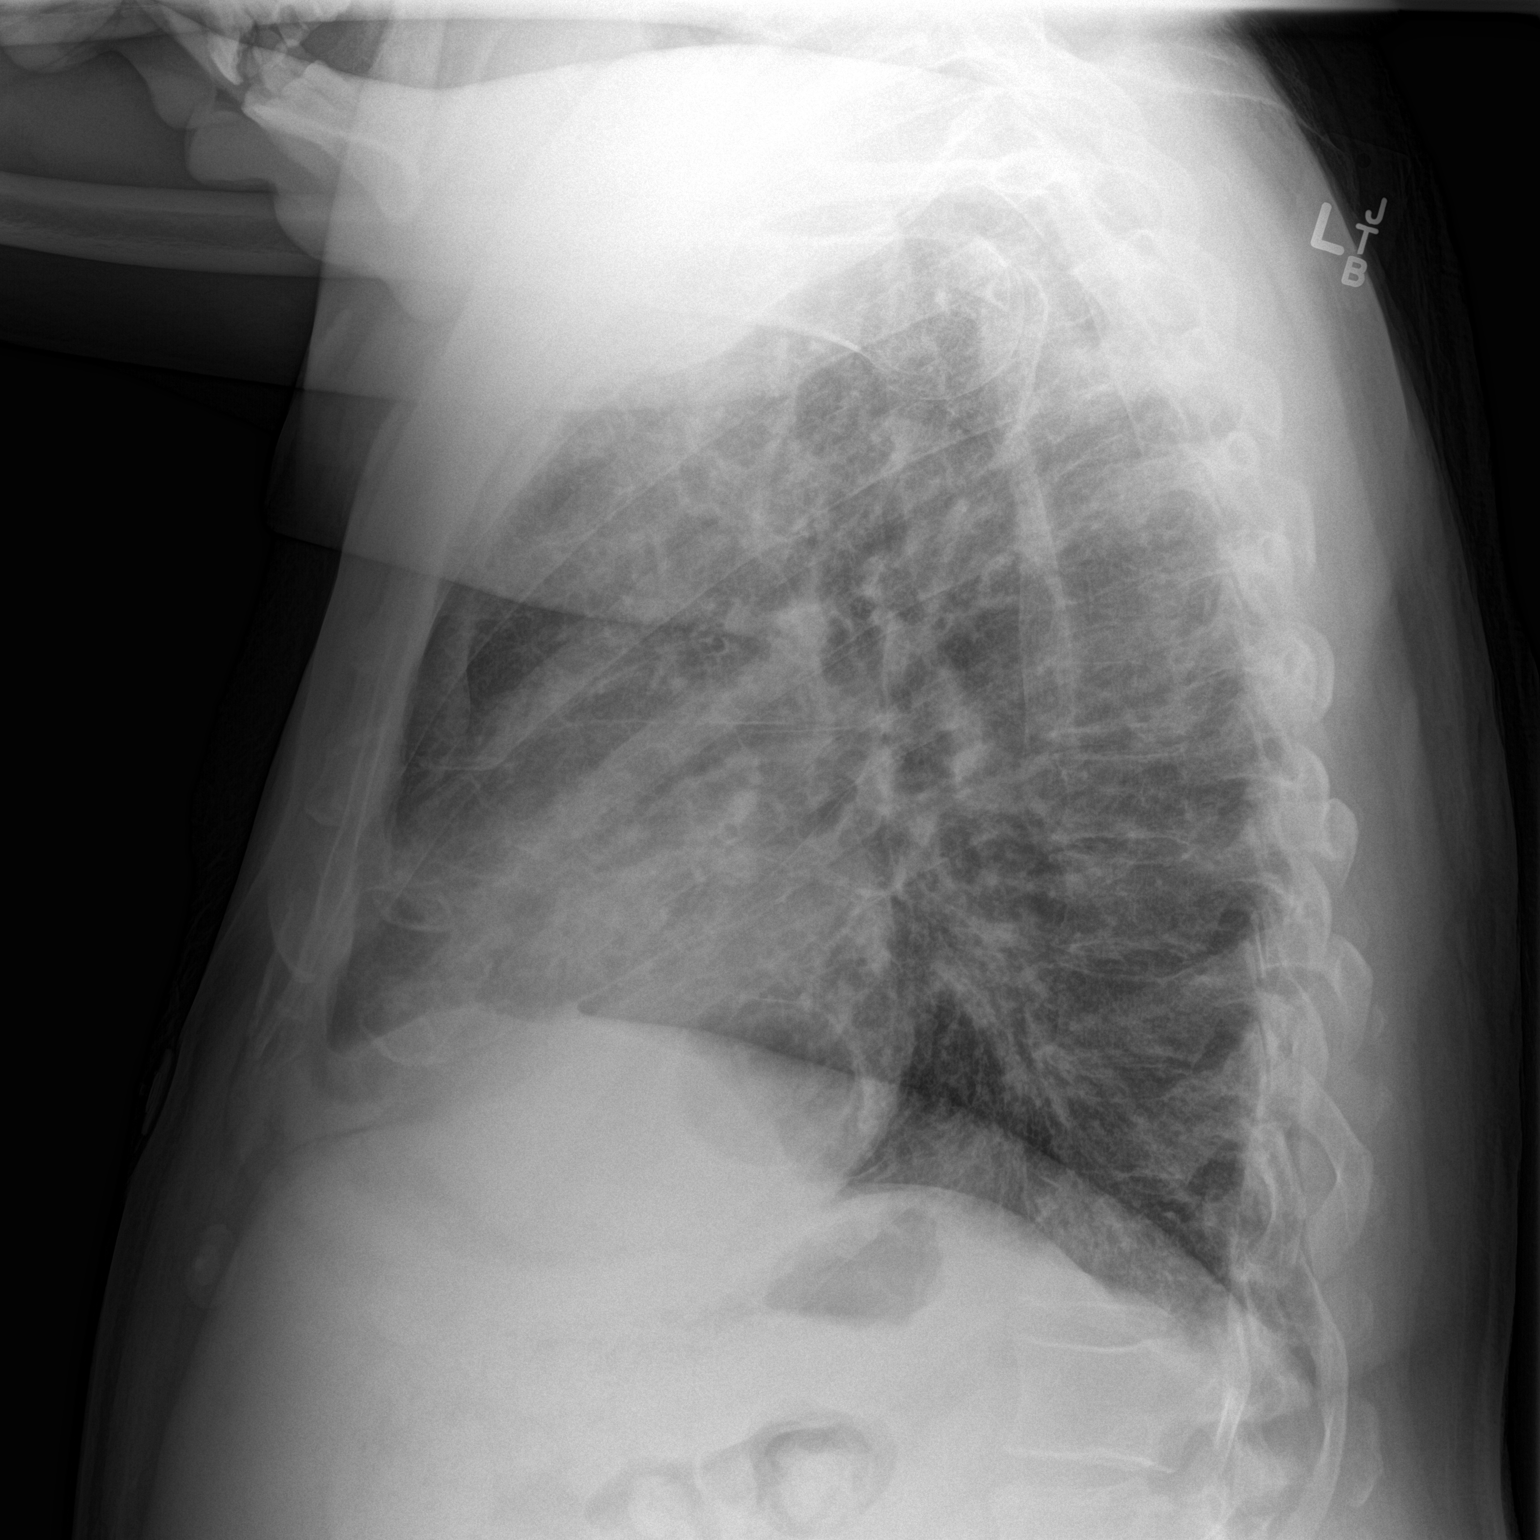

[2 of 2 positions shown; findings below may reference images not displayed]

FINDINGS: Mild enlargement of cardiac silhouette.

Mediastinal contours and pulmonary vascularity normal.

Central peribronchial thickening with diffuse interstitial
prominence which could be acute or chronic ; mild pulmonary edema
and infection not excluded.

Small Namenda seal at LEFT base.

No pleural effusion or pneumothorax.

Bones unremarkable.
IMPRESSION: Enlargement of cardiac silhouette.

Bronchitic interstitial changes of uncertain acuity, cannot exclude
mild edema or infection.

## 2015-02-22 MED ORDER — CYCLOBENZAPRINE HCL 10 MG PO TABS
10.0000 mg | ORAL_TABLET | Freq: Once | ORAL | Status: DC
  Filled 2015-02-22: qty 1

## 2015-02-22 MED ORDER — GI COCKTAIL ~~LOC~~
30.0000 mL | Freq: Once | ORAL | Status: DC
  Filled 2015-02-22: qty 30

## 2015-02-22 MED ORDER — NICOTINE 21 MG/24HR TD PT24: 21.0000 mg | MEDICATED_PATCH | Freq: Once | TRANSDERMAL | Status: DC

## 2015-02-22 MED ORDER — CYCLOBENZAPRINE HCL 10 MG PO TABS: 10.0000 mg | ORAL_TABLET | Freq: Two times a day (BID) | ORAL | Status: DC | PRN

## 2015-02-22 MED ORDER — ASPIRIN 325 MG PO TABS
325.0000 mg | ORAL_TABLET | Freq: Every day | ORAL | Status: DC
  Filled 2015-02-22: qty 1

## 2015-02-22 MED ORDER — HYDROMORPHONE HCL 1 MG/ML IJ SOLN
INTRAMUSCULAR | Status: AC
  Filled 2015-02-22: qty 1

## 2015-02-22 NOTE — ED Notes (Signed)
Pt reports left side lower back pain that started yesterday after heavy lifting and work, denies urinary symptoms.

## 2015-02-22 NOTE — ED Notes (Signed)
Medication given during downtime.

## 2015-02-22 NOTE — ED Notes (Signed)
Pt reports he initially told triage he was here for back pain that started after lifting heavy objects yesterday. Pt now admits he has had chest pain off and on to his central chest since yesterday. Hx of heart cath. Pt also reports he usually has chest pain/heart burn that comes and goes but this feels a lot stronger than usual.

## 2015-02-22 NOTE — ED Notes (Signed)
Pt reports that he does not want to stay in the hospital. ED resident informed. Will talk with pt.

## 2015-02-22 NOTE — ED Notes (Signed)
Patient transported to X-ray 

## 2015-02-22 NOTE — ED Notes (Signed)
Patient complaining of centralized chest pain with no radiation.

## 2015-02-22 NOTE — ED Notes (Signed)
Charge RN notified and advised patient needs to moved to acute side.

## 2015-02-22 NOTE — ED Provider Notes (Signed)
CSN: [id number]     Arrival date & time 02/22/15  1146 History   First MD Initiated Contact with Patient 02/22/15 1242     Chief Complaint  Patient presents with  . Chest Pain     (Consider location/radiation/quality/duration/timing/severity/associated sxs/prior Treatment) HPI Comments: Presents today with chest pain and back pain. 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 He was helping his brother lift a heavy piece of equipment when he had pain in his lower left back that started yesterday. Since then he complains of left lower back pain that worsens with movement. He denies any numbness or tingling in his extremities. No difficulty walking. No loss of bowel or bladder control. No numbness around the anal region. No bony midline pain. Patient also endorses having pressure-like chest pain he also describes burning since yesterday. He relates this is often on the not associated with exertion. Patient has a prior history of cardial infarction in the past with stents that were placed. He also has a family history and his father of a heart attack at age 5. He denies any new medications. He states he has a history of hypertension and hypercholesterolemia but he is not compliant with his medications. Patient is also a smoker and smokes 2 packs a day. He denies any alcohol or illicit drug use.  Patient is a 49 y.o. male presenting with chest pain. The history is provided by the patient. No language interpreter was used.  Chest Pain Pain location:  Substernal area Pain quality: burning and pressure   Pain radiates to:  Does not radiate Pain radiates to the back: no   Timing:  Intermittent Progression:  Unchanged Chronicity:  Recurrent Context: lifting (reports lifting heavy equipment yesterday, pain more in back yesterday) and at rest   Context: not breathing, no drug use and not eating   Relieved by:  Nothing Worsened by:  Nothing tried Ineffective  treatments:  Antacids Associated symptoms: back pain (L lower back pain after lifting eqipmen yesterday)   Associated symptoms: no abdominal pain, no anorexia, no anxiety, no fever, no headache, no nausea, no numbness, no palpitations, no shortness of breath and not vomiting     Past Medical History  Diagnosis Date  . Hypertension    Past Surgical History  Procedure Laterality Date  . Splenectomy    . Partial nephrectomy Left    History reviewed. No pertinent family history. Social History  Substance Use Topics  . Smoking status: Current Every Day Smoker  . Smokeless tobacco: None  . Alcohol Use: No    Review of Systems  Constitutional: Negative for fever and chills.  HENT: Negative for congestion and rhinorrhea.   Eyes: Negative for photophobia and visual disturbance.  Respiratory: Negative for shortness of breath and wheezing.   Cardiovascular: Positive for chest pain (as described in HPI). Negative for palpitations.  Gastrointestinal: Negative for nausea, vomiting, abdominal pain and anorexia.  Genitourinary: Negative for dysuria and difficulty urinating.  Musculoskeletal: Positive for back pain (L lower back pain after lifting eqipmen yesterday). Negative for neck pain.  Skin: Negative for pallor and rash.  Neurological: Negative for light-headedness, numbness and headaches.  Psychiatric/Behavioral: Negative for confusion and agitation.  All other systems reviewed and are negative.     Allergies  Review of patient's allergies indicates no known allergies.  Home Medications   Prior to Admission medications   Medication Sig Start Date End Date Taking? Authorizing Provider  cyclobenzaprine (FLEXERIL) 10 MG tablet  Take 1 tablet (10 mg total) by mouth 2 (two) times daily as needed for muscle spasms. 02/22/15   Madolyn Frieze, MD  ibuprofen (ADVIL,MOTRIN) 200 MG tablet Take 400 mg by mouth every 6 (six) hours as needed for moderate pain.     Historical Provider, MD   lisinopril (PRINIVIL,ZESTRIL) 5 MG tablet Take 5 mg by mouth daily.    Historical Provider, MD  metoprolol tartrate (LOPRESSOR) 25 MG tablet Take 25 mg by mouth 2 (two) times daily.    Historical Provider, MD  ondansetron (ZOFRAN ODT) 4 MG disintegrating tablet Take 1 tablet (4 mg total) by mouth every 8 (eight) hours as needed for nausea. 12/10/14   Garlon Hatchet, PA-C  oxyCODONE-acetaminophen (PERCOCET) 5-325 MG per tablet Take 1 tablet by mouth every 8 (eight) hours as needed. 01/20/15   Hannah Muthersbaugh, PA-C  penicillin v potassium (VEETID) 500 MG tablet Take 1 tablet (500 mg total) by mouth 3 (three) times daily. 01/20/15   Hannah Muthersbaugh, PA-C   BP 120/74 mmHg  Pulse 85  Temp(Src) 98.2 F (36.8 C) (Oral)  Resp 18  Ht 5\' 10"  (1.778 m)  Wt 242 lb (109.77 kg)  BMI 34.72 kg/m2  SpO2 99% Physical Exam  Constitutional: He is oriented to person, place, and time. No distress.  Appears older than stated age  HENT:  Head: Normocephalic and atraumatic.  Eyes: Conjunctivae and EOM are normal.  Neck: Normal range of motion. Neck supple.  Cardiovascular: Normal rate, regular rhythm and normal heart sounds.   No murmur heard. Pulmonary/Chest: Effort normal and breath sounds normal. No respiratory distress. He has no wheezes.  Abdominal: Soft. He exhibits no distension. There is no tenderness. There is no guarding.  Musculoskeletal: Normal range of motion. He exhibits no tenderness.  Neurological: He is alert and oriented to person, place, and time. He has normal strength. No sensory deficit. Gait normal. GCS eye subscore is 4. GCS verbal subscore is 5. GCS motor subscore is 6.  Skin: Skin is warm and dry. He is not diaphoretic.  Psychiatric: He has a normal mood and affect. His behavior is normal.  Nursing note and vitals reviewed.   ED Course  Procedures (including critical care time) Labs Review Labs Reviewed  CBC - Abnormal; Notable for the following:    WBC 18.2 (*)    All  other components within normal limits  BASIC METABOLIC PANEL - Abnormal; Notable for the following:    Glucose, Bld 107 (*)    All other components within normal limits  I-STAT TROPOININ, ED  I-STAT TROPOININ, ED    Imaging Review Dg Chest 2 View  02/22/2015   CLINICAL DATA:  Burning sensation from anterior neck down to chest since yesterday, history of coronary artery disease post MI and stent placement, smoker, hypertension  EXAM: CHEST  2 VIEW  COMPARISON:  None  FINDINGS: Mild enlargement of cardiac silhouette.  Mediastinal contours and pulmonary vascularity normal.  Central peribronchial thickening with diffuse interstitial prominence which could be acute or chronic ; mild pulmonary edema and infection not excluded.  Small Namenda seal at LEFT base.  No pleural effusion or pneumothorax.  Bones unremarkable.  IMPRESSION: Enlargement of cardiac silhouette.  Bronchitic interstitial changes of uncertain acuity, cannot exclude mild edema or infection.   Electronically Signed   By: Ulyses Southward M.D.   On: 02/22/2015 13:52   I have personally reviewed and evaluated these images and lab results as part of my medical decision-making.   EKG  Interpretation   Date/Time:  Wednesday February 22 2015 12:52:29 EDT Ventricular Rate:  95 PR Interval:  152 QRS Duration: 94 QT Interval:  380 QTC Calculation: 477 R Axis:   -15 Text Interpretation:  Normal sinus rhythm Possible Left atrial enlargement  Septal infarct , age undetermined Abnormal ECG When compared with ECG of  04/03/2014, No significant change was found Confirmed by [facility]  MD, Aliou  ([id number]) on 02/22/2015 3:02:13 PM      MDM   Final diagnoses:  Chest pain, unspecified chest pain type    Patient presented today for left lower back pain and also transient episodes of substernal chest pain that started over the last day. Patient's left lower back pain is likely secondary to musculoskeletal etiology. Patient was given Dilaudid IV and  Flexeril by mouth here in the department. Patient's chest pain is concerning for possible ACS. No acute changes were noted on patient's EKG today. Troponin does not appear to be trending upward based on serial troponins here in the department. Given patient's known history of coronary artery disease, I recommended to the patient that he stay for further evaluation and management. Patient initially agreeable to stay for workup, however later on he stated that he was given a be unable to stay due to several factors at home. Patient also indicated that he wanted to be able to smoke and I explained to him that we would not allow this or allow him to leave is admitted to go smoke. I offered him a nicotine patch but patient refused. Both myself and the hospitalist discussed the risks and benefits including worsening cardiac disease, worsening disability secondary to underlying illness, possible death if untreated cardiac disease progresses and patient relates he understands the risks of leaving AGAINST MEDICAL ADVICE but would like to leave at this point. Patient was clinically sober and appeared to be up to make reasonable decisions and was quickly discharged AMA from the department.    Madolyn Frieze, MD 02/23/15 6045  Dione Booze, MD 02/24/15 [phone]

## 2015-02-22 NOTE — Consult Note (Signed)
Requesting physician: Dr Preston Fleeting  Reason for consultation: Chest pain   History of Present Illness: 49 year old obese male with history of coronary artery disease with MI in 2012 status post? Cardiac cath with stenting at [facility], OSA, GERD, ongoing heavy tobacco use, hypertension presented with left lower back pain since yesterday. He reports that he was helping his brother lift a heavy piece of equipment when he suddenly had pain in his lower back which was worsened with movement. Denies any tingling or numbness in his legs. Denies difficulty ambulating, bowel or urinary symptoms. He also reports having burning sensation in the substernal area since yesterday which is associated with exertion. The symptoms are similar to when he had his MI 4 years back. I didn't have access to results of his cardiac cath. Reports that the substernal discomfort was off-and-on, nonradiating and without any aggravating or relieving symptoms. Denies any nausea, vomiting, palpitations, shortness of breath, cough, orthopnea or PND. Denies fevers, chills, headache, blurred vision, abdominal pain, bowel or urinary symptoms. Patient occasionally follows up at the [facility] clinic in [place] for his blood pressure medications.  Course in the ED Patient's vitals were stable. Blood will done showed only severe 18.2 K, hemoglobin of 15.3 and platelets of 355. Chemistry was unremarkable. I-STAT troponin was negative. Chest x-ray showed bronchitic interstitial changes. UA was unremarkable. EKG showed normal sinus rhythm at 75 with no ST-T changes. Hospitalists admission requested to rule out ACS.  Allergies:  No Known Allergies    Past Medical History  Diagnosis Date  . Hypertension     Past Surgical History  Procedure Laterality Date  . Splenectomy    . Partial nephrectomy Left     Medications:  Scheduled Meds: . aspirin  325 mg Oral Daily  . cyclobenzaprine  10 mg Oral Once  . gi  cocktail  30 mL Oral Once  . HYDROmorphone      . nicotine  21 mg Transdermal Once   Continuous Infusions:  PRN Meds:.  Social History:  reports that he has been smoking.  He does not have any smokeless tobacco history on file. He reports that he does not drink alcohol or use illicit drugs.  History reviewed. No pertinent family history.  Review of Systems:  As outlined in history of present illness. 12 point review of systems unremarkable.   Physical Exam:  Filed Vitals:   02/22/15 1406 02/22/15 1434 02/22/15 1615 02/22/15 1630  BP: 135/84 117/92 139/86 132/83  Pulse:  75 71 67  Temp:      TempSrc:      Resp: Height:      Weight:      SpO2:  98% 93% 93%    No intake or output data in the 24 hours ending 02/22/15 1736  General: Middle aged obese male in no acute distress HEENT: No pallor, moist oral mucosa, supple neck Heart: Regular rate and rhythm, without murmurs, rubs, gallops. Lungs: Clear to auscultation bilaterally. Abdomen: Soft, nontender, nondistended, positive bowel sounds. Extremities: Warm, no edema Neuro: Grossly intact, nonfocal.  Labs on Admission:  CBC:    Component Value Date/Time   WBC 18.2* 02/22/2015 1300   WBC 16.9* 09/21/2013 1245   HGB 15.3 02/22/2015 1300   HGB 15.1 09/21/2013 1245   HCT 45.8 02/22/2015 1300   HCT 45.9 09/21/2013 1245   PLT 355 02/22/2015 1300   PLT 358 09/21/2013 1245   MCV 93.7 02/22/2015 1300   MCV 93 09/21/2013  1245   NEUTROABS 11.1* 12/10/2014 1943   LYMPHSABS 1.1 12/10/2014 1943   MONOABS 1.4* 12/10/2014 1943   EOSABS 0.6 12/10/2014 1943   BASOSABS 0.0 12/10/2014 1943    Basic Metabolic Panel:    Component Value Date/Time   NA 138 02/22/2015 1300   NA 139 09/21/2013 1245   K 3.9 02/22/2015 1300   K 4.3 09/21/2013 1245   CL 102 02/22/2015 1300   CL 106 09/21/2013 1245   CO2 28 02/22/2015 1300   CO2 30 09/21/2013 1245   BUN 13 02/22/2015 1300   BUN 15 09/21/2013 1245   CREATININE 1.01  02/22/2015 1300   CREATININE 0.97 09/21/2013 1245   GLUCOSE 107* 02/22/2015 1300   GLUCOSE 100* 09/21/2013 1245   CALCIUM 9.0 02/22/2015 1300   CALCIUM 8.3* 09/21/2013 1245    Radiological Exams on Admission: Dg Chest 2 View  02/22/2015   CLINICAL DATA:  Burning sensation from anterior neck down to chest since yesterday, history of coronary artery disease post MI and stent placement, smoker, hypertension  EXAM: CHEST  2 VIEW  COMPARISON:  None  FINDINGS: Mild enlargement of cardiac silhouette.  Mediastinal contours and pulmonary vascularity normal.  Central peribronchial thickening with diffuse interstitial prominence which could be acute or chronic ; mild pulmonary edema and infection not excluded.  Small Namenda seal at LEFT base.  No pleural effusion or pneumothorax.  Bones unremarkable.  IMPRESSION: Enlargement of cardiac silhouette.  Bronchitic interstitial changes of uncertain acuity, cannot exclude mild edema or infection.   Electronically Signed   By: Ulyses Southward M.D.   On: 02/22/2015 13:52    Assessment/Plan Active Problems:   Chest pain at rest Has both typical and atypical symptoms. EKG and initial troponin negative. Patient currently chest pain-free at this time. His heart score is 4. Patient plan to be admitted to the hospital to rule out ACS and possible stress test given significant underlying risk factors and history of MI. She however refused to be admitted stating that his main complaint was left lower back pain and that he had to get back to work tomorrow morning and also needed to smoke. Patient was instructed clearly on the need for his hospitalization and the risk associated with leaving AGAINST MEDICAL ADVICE. Patient understood clearly but still wanted to leave. I have instructed him clearly to follow-up at the outpatient clinic. Given his significant cardiac history he should be on aspirin, continue his blood pressure medications, quit smoking, check his lipid profile and  be started on necessary medications. He would also benefit from outpatient cardiology referral.  Patient agrees to follow-up and outpatient clinic.  Leucocytosis No clear etiology. W/up so far negative for infection.  Tobacco abuse Smokes 2 packs per day. Counseled strongly on cessation.   Patient being signed out from the ED.   Time Spent on Admission: 40 minutes  Mida Cory 02/22/2015, 5:36 PM

## 2015-03-10 ENCOUNTER — Encounter: Payer: Self-pay | Admitting: Emergency Medicine

## 2015-03-10 ENCOUNTER — Emergency Department
Admission: EM | Admit: 2015-03-10 | Discharge: 2015-03-10 | Discharge status: Against Medical Advice | Payer: Self-pay | Attending: Student | Admitting: Student

## 2015-03-10 ENCOUNTER — Emergency Department: Payer: Self-pay

## 2015-03-10 DIAGNOSIS — I10 Essential (primary) hypertension: Secondary | ICD-10-CM | POA: Insufficient documentation

## 2015-03-10 DIAGNOSIS — Z720 Tobacco use: Secondary | ICD-10-CM | POA: Insufficient documentation

## 2015-03-10 DIAGNOSIS — R079 Chest pain, unspecified: Secondary | ICD-10-CM | POA: Insufficient documentation

## 2015-03-10 LAB — CBC
HCT: 44.1 % (ref 40.0–52.0)
Hemoglobin: 14.5 g/dL (ref 13.0–18.0)
MCH: 30.2 pg (ref 26.0–34.0)
MCHC: 32.9 g/dL (ref 32.0–36.0)
MCV: 91.7 fL (ref 80.0–100.0)
PLATELETS: 422 10*3/uL (ref 150–440)
RBC: 4.81 MIL/uL (ref 4.40–5.90)
RDW: 14 % (ref 11.5–14.5)
WBC: 11.6 10*3/uL — AB (ref 3.8–10.6)

## 2015-03-10 LAB — BASIC METABOLIC PANEL
Anion gap: 7 (ref 5–15)
BUN: 21 mg/dL — AB (ref 6–20)
CALCIUM: 8.9 mg/dL (ref 8.9–10.3)
CO2: 26 mmol/L (ref 22–32)
CREATININE: 0.97 mg/dL (ref 0.61–1.24)
Chloride: 107 mmol/L (ref 101–111)
Glucose, Bld: 105 mg/dL — ABNORMAL HIGH (ref 65–99)
Potassium: 4.6 mmol/L (ref 3.5–5.1)
SODIUM: 140 mmol/L (ref 135–145)

## 2015-03-10 LAB — TROPONIN I

## 2015-03-10 IMAGING — CR DG CHEST 2V
2 series · 2 of 2 positions shown · non-contrast
Comparison: [DATE]

CLINICAL DATA: Chest pain, shortness of breath.

EXAM:
CHEST  2 VIEW

[chest pa]
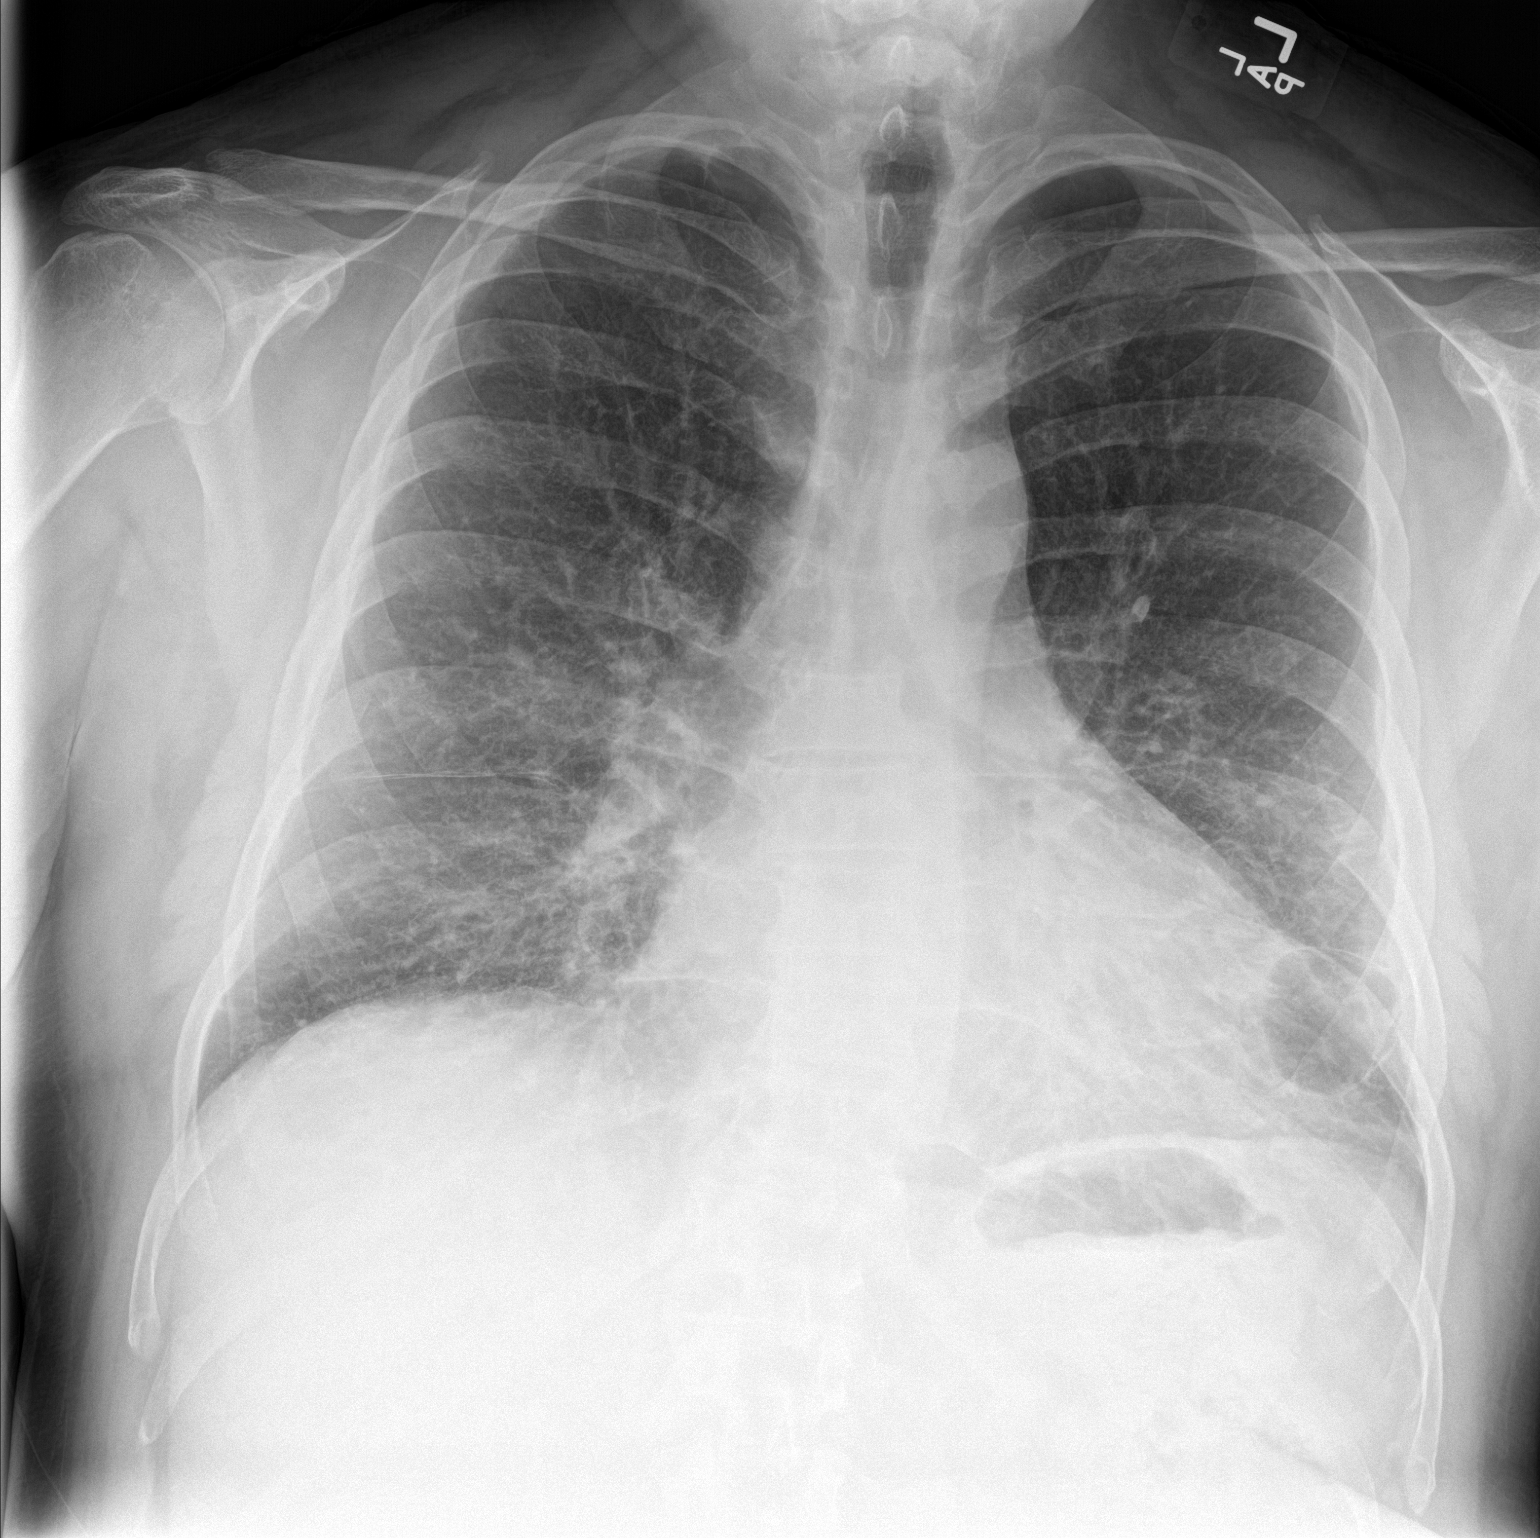

[chest lat]
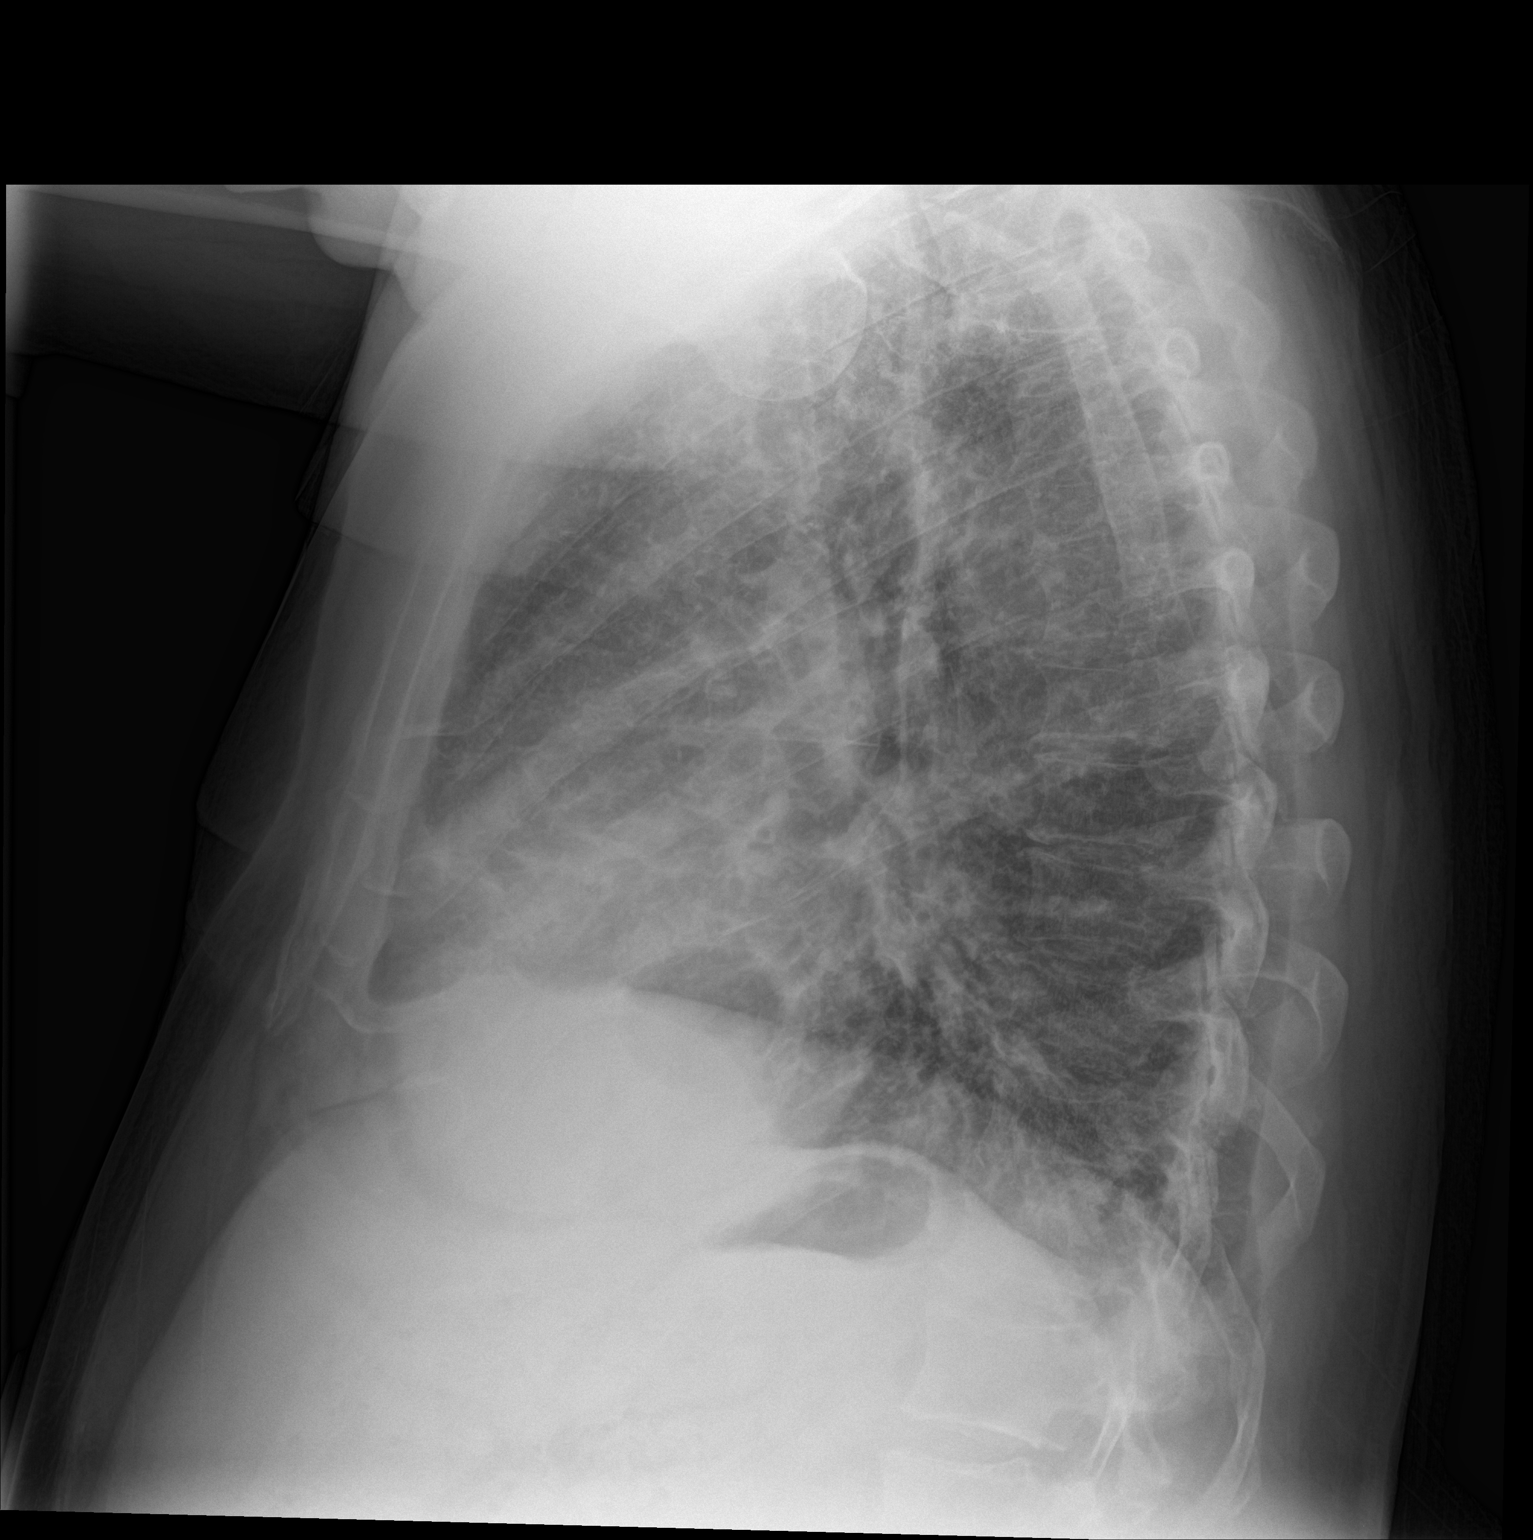

[2 of 2 positions shown; findings below may reference images not displayed]

FINDINGS: Moderate enlargement of the cardiac silhouette is again noted with
central vascular congestion. Left basilar bulla reidentified.
Increased interstitial lung making bizarre reidentified diffusely
with a few Kerley B-lines noted indicating probable interstitial
edema. No pleural effusion. No focal pulmonary opacity.
IMPRESSION: Cardiomegaly and interstitial Kerley B-lines most compatible with
edema.

## 2015-03-10 NOTE — ED Notes (Signed)
Patient to ED with report of 2 week history of chest pain, burning in nature off and on.

## 2015-05-16 ENCOUNTER — Emergency Department
Admission: EM | Admit: 2015-05-16 | Discharge: 2015-05-16 | Disposition: A | Discharge status: Home / Self Care | Payer: Self-pay | Attending: Student | Admitting: Student

## 2015-05-16 ENCOUNTER — Emergency Department: Payer: Self-pay

## 2015-05-16 DIAGNOSIS — I10 Essential (primary) hypertension: Secondary | ICD-10-CM | POA: Insufficient documentation

## 2015-05-16 DIAGNOSIS — R05 Cough: Secondary | ICD-10-CM

## 2015-05-16 DIAGNOSIS — M25422 Effusion, left elbow: Secondary | ICD-10-CM | POA: Insufficient documentation

## 2015-05-16 DIAGNOSIS — J441 Chronic obstructive pulmonary disease with (acute) exacerbation: Secondary | ICD-10-CM | POA: Insufficient documentation

## 2015-05-16 DIAGNOSIS — Z79899 Other long term (current) drug therapy: Secondary | ICD-10-CM | POA: Insufficient documentation

## 2015-05-16 DIAGNOSIS — F17200 Nicotine dependence, unspecified, uncomplicated: Secondary | ICD-10-CM | POA: Insufficient documentation

## 2015-05-16 DIAGNOSIS — J40 Bronchitis, not specified as acute or chronic: Secondary | ICD-10-CM

## 2015-05-16 DIAGNOSIS — R059 Cough, unspecified: Secondary | ICD-10-CM

## 2015-05-16 IMAGING — CR DG CHEST 2V
1 series · 2 of 2 positions shown · non-contrast
Comparison: PA and lateral chest [DATE], [DATE] and
[DATE].

CLINICAL DATA: Body aches and chills and swelling about the left
elbow for approximately 2 weeks with mild cough. Initial encounter.

EXAM:
CHEST  2 VIEW

[Series 1: dg chest 2 view · 0.14mm/px · 2 of 2 slices shown]
[im 1/2]
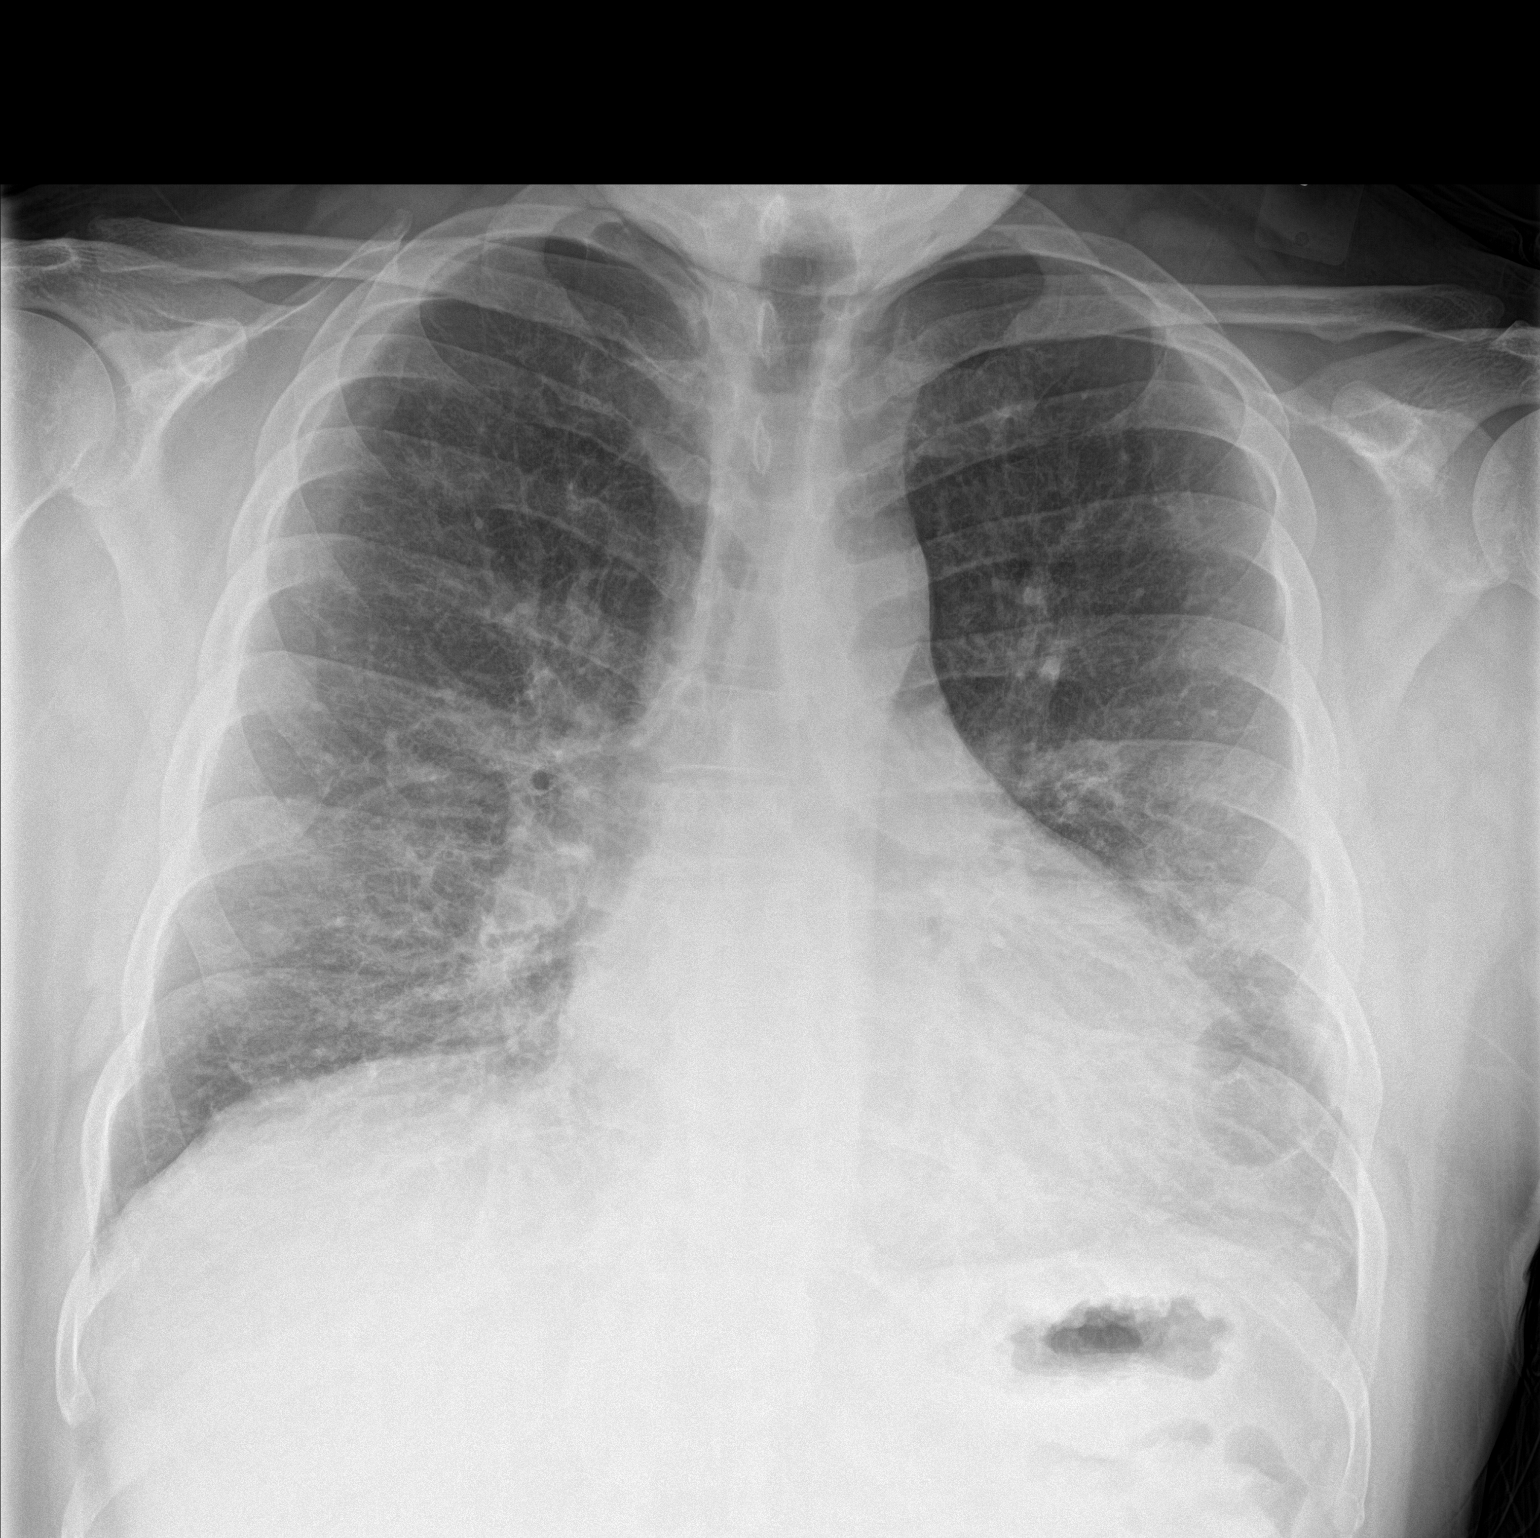
[im 2/2]
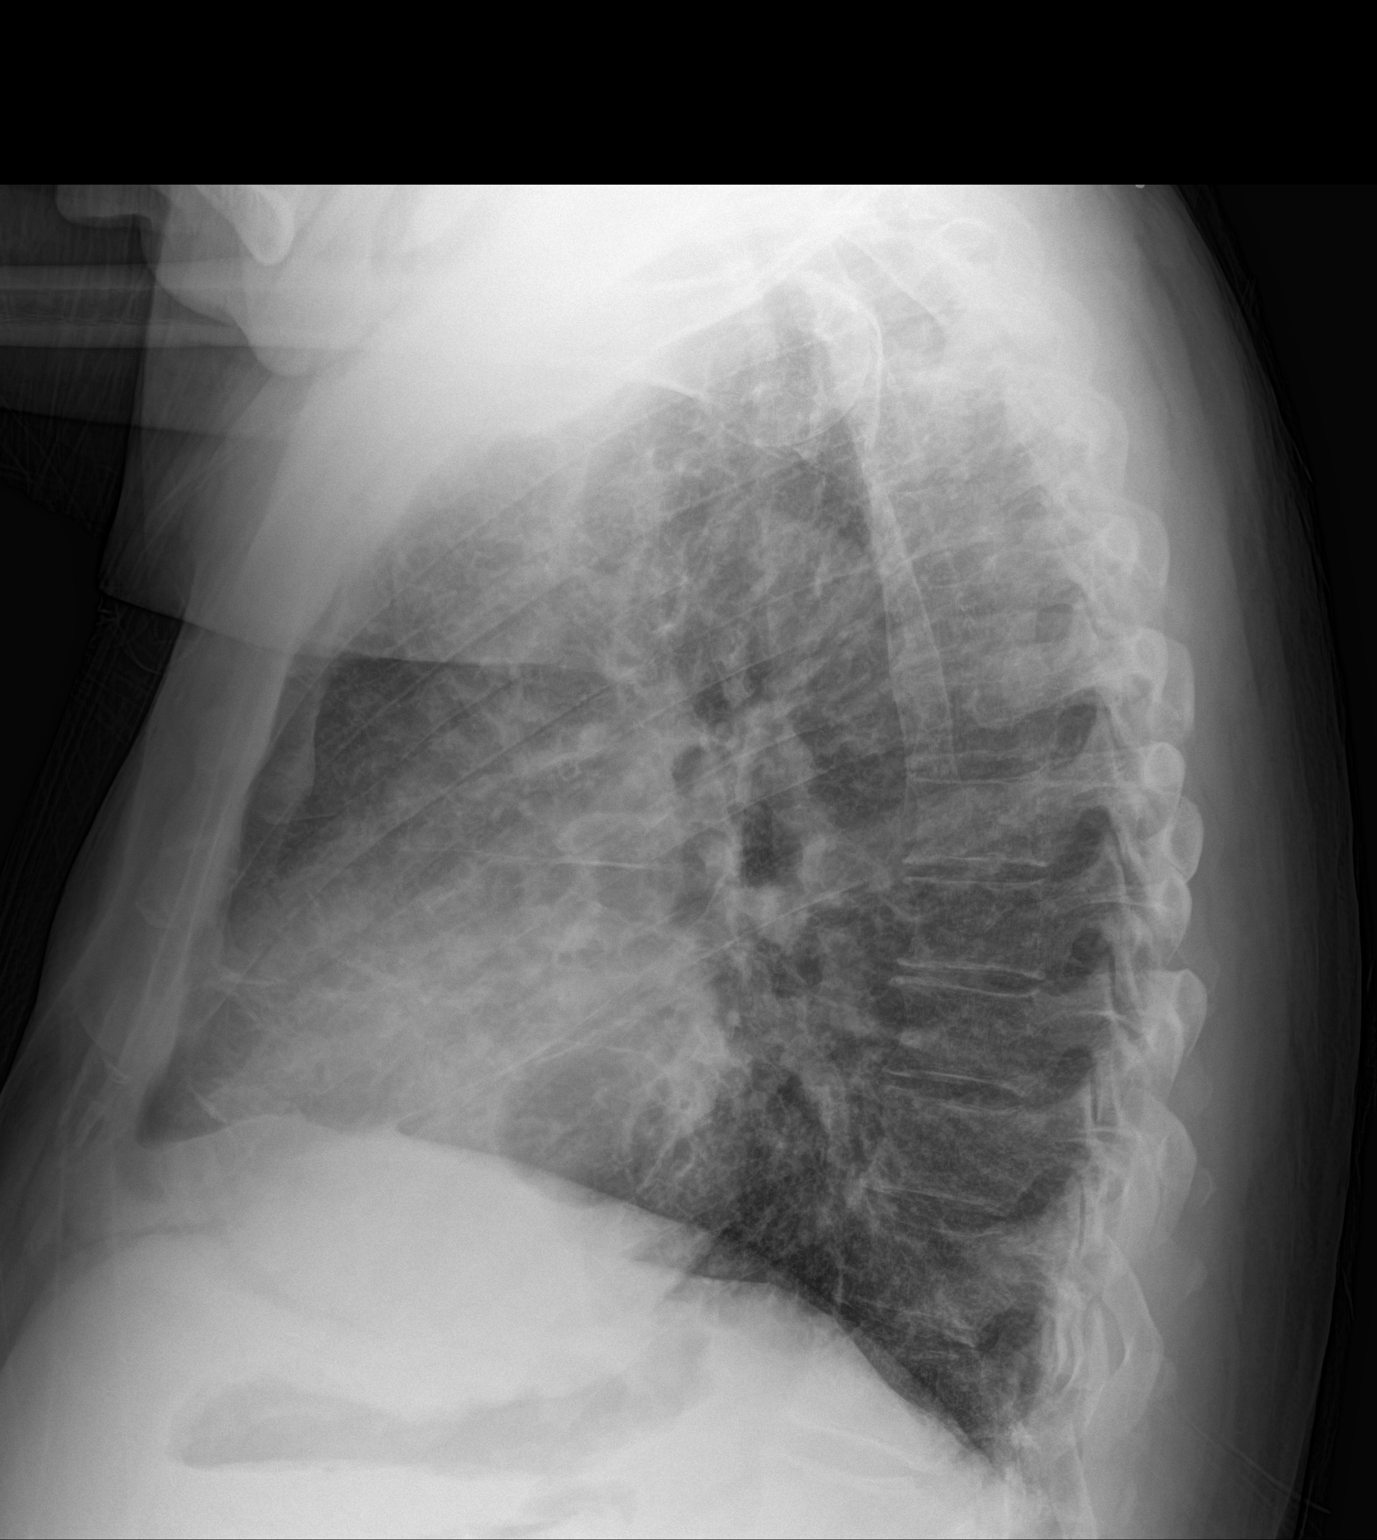

[2 of 2 positions shown; findings below may reference images not displayed]

FINDINGS: There is peribronchial thickening bilaterally. The lungs are
emphysematous. Cystic/bullous lesion in the left lower lobe is
unchanged. No pneumothorax or pleural effusion. Heart size is
normal.
IMPRESSION: Findings consistent with COPD.  No acute abnormality.

## 2015-05-16 IMAGING — CR DG ELBOW COMPLETE 3+V*L*
1 series · 4 of 4 positions shown · non-contrast
Comparison: None

CLINICAL DATA: Body aches, chills, and LEFT elbow swelling for the
past couple weeks, slight cough for couple weeks, LEFT elbow pain

EXAM:
LEFT ELBOW - COMPLETE 3+ VIEW

[Series 1: dg elbow complete left · 0.14mm/px · 4 of 4 slices shown]
[im 1/4]
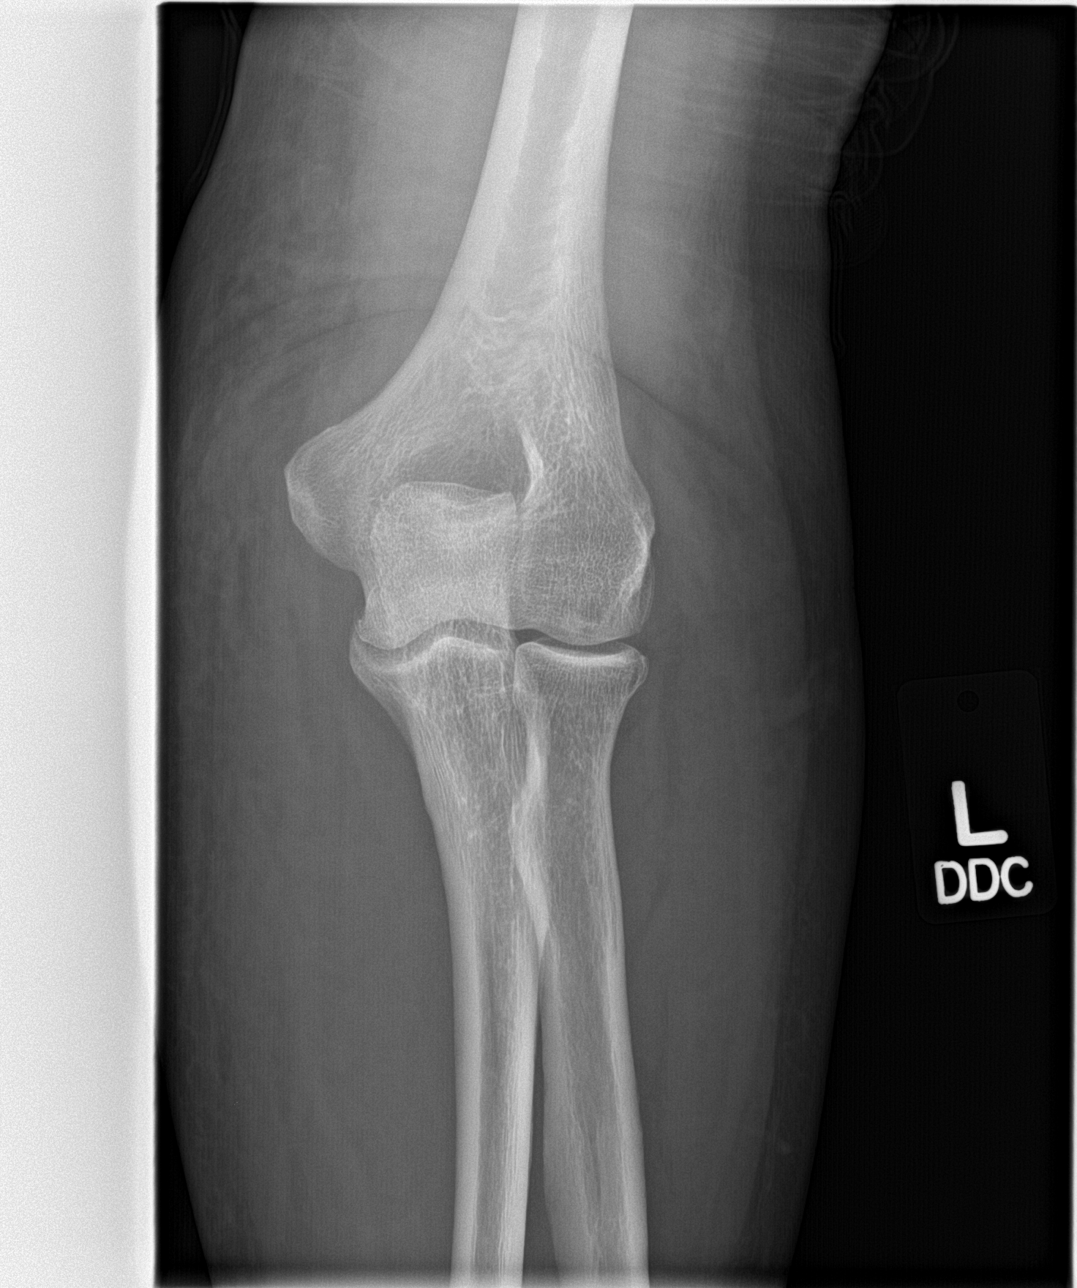
[im 2/4]
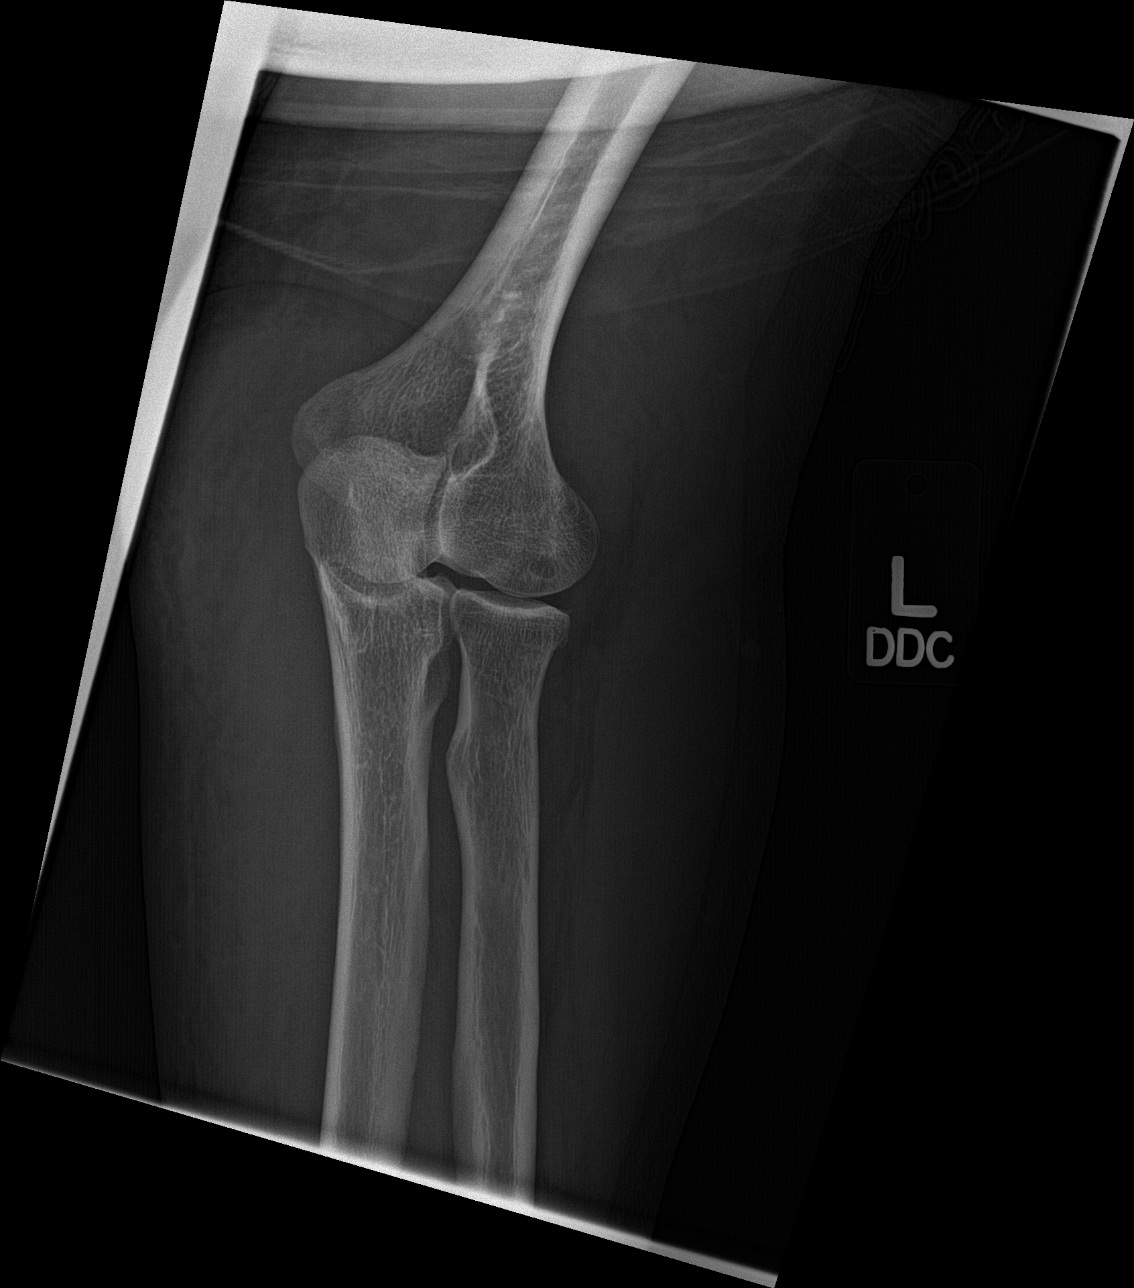
[im 3/4]
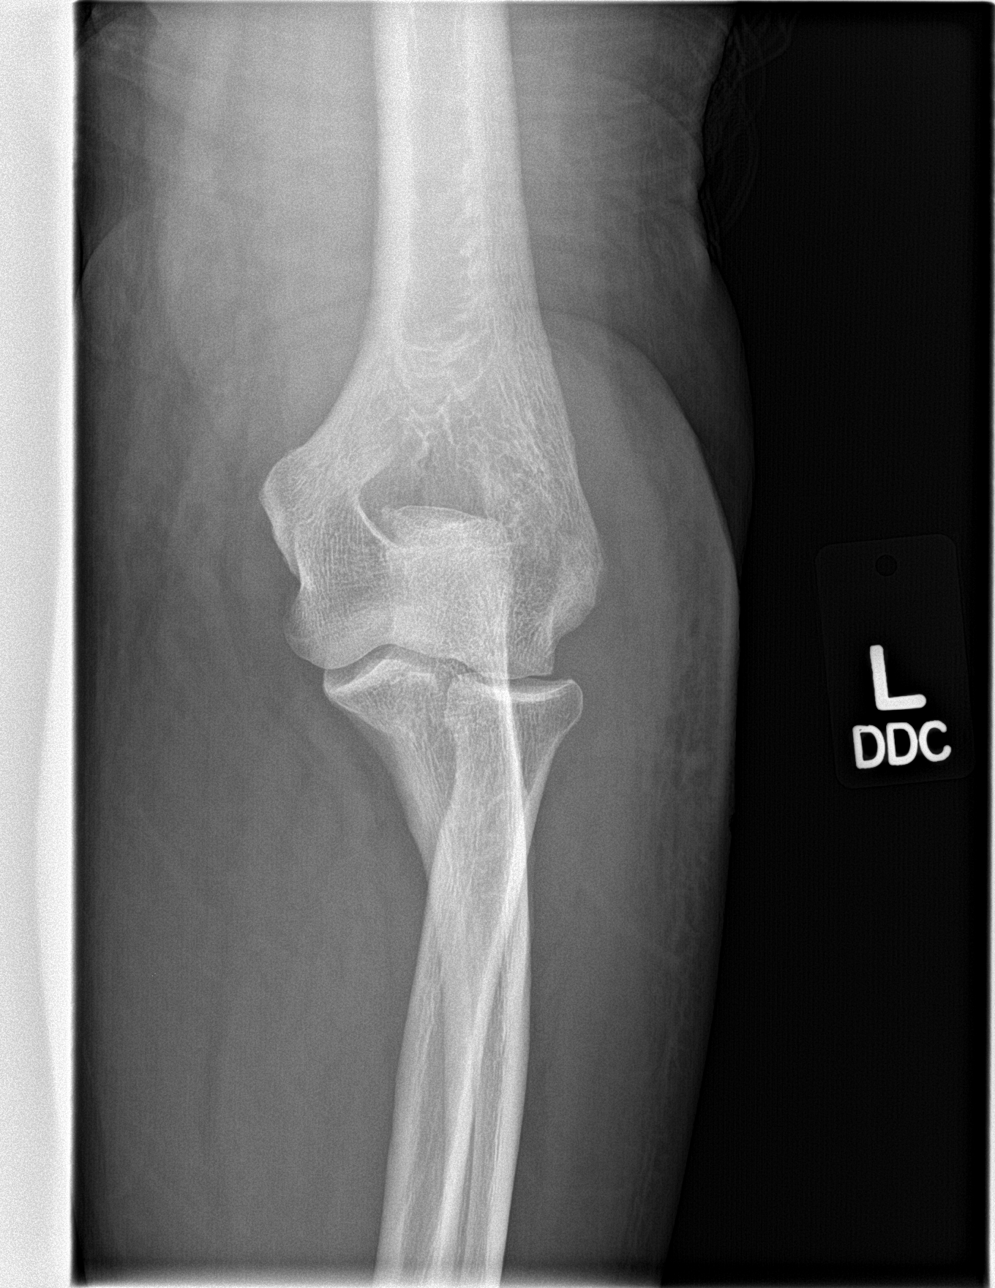
[im 4/4]
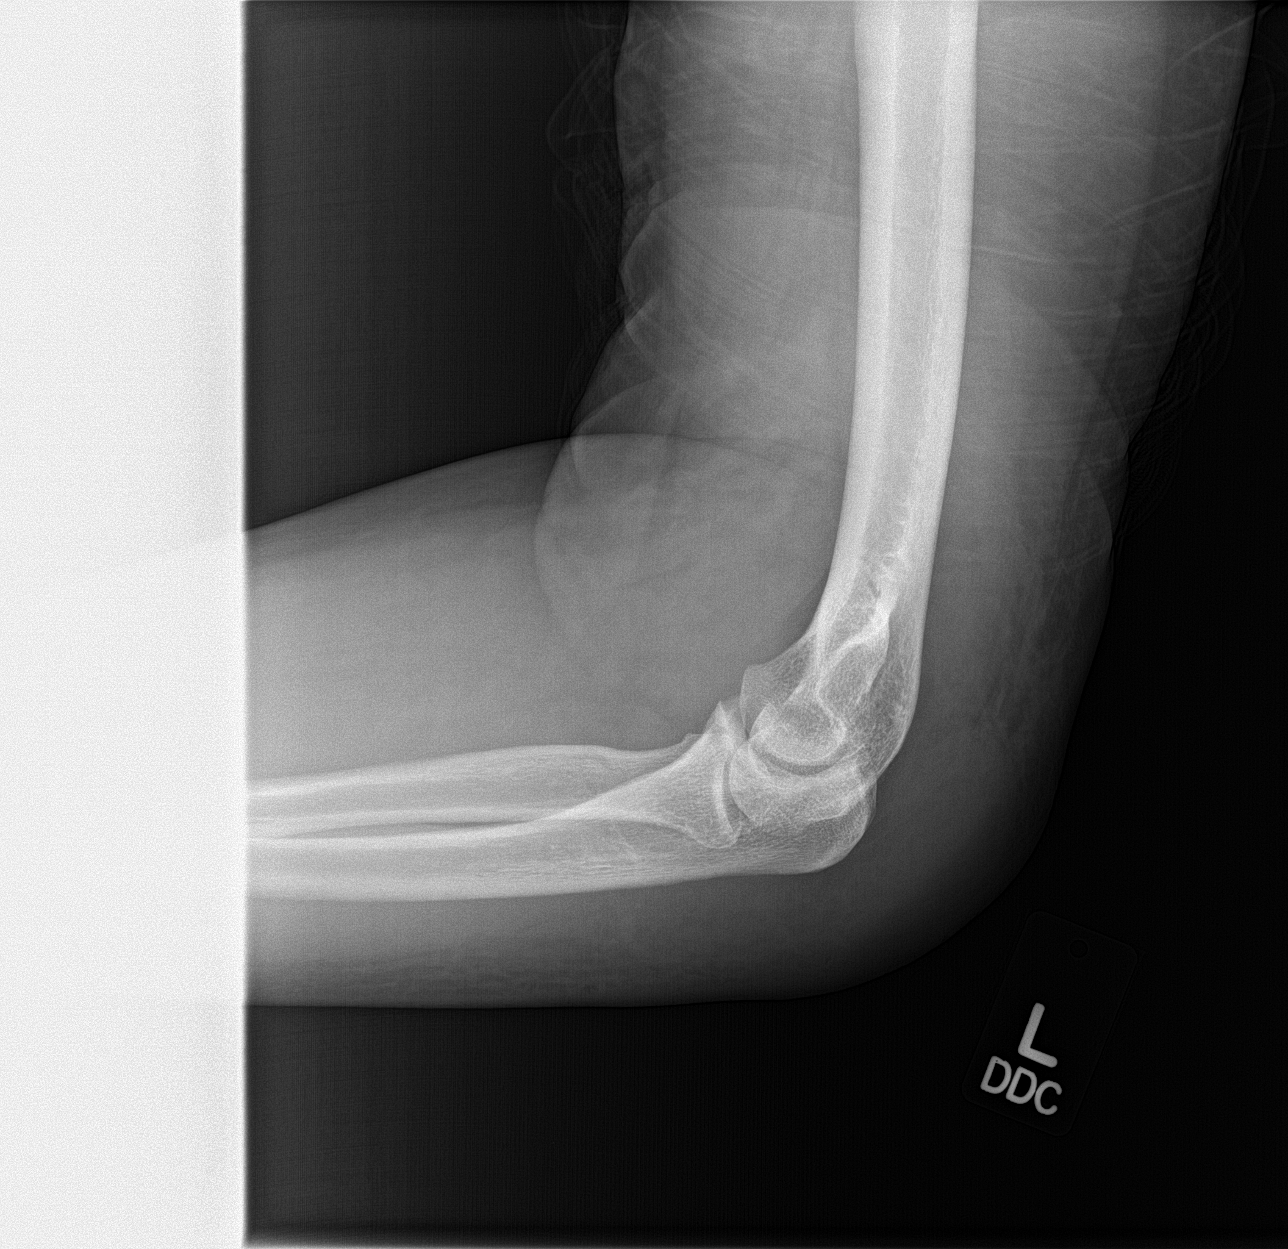

[4 of 4 positions shown; findings below may reference images not displayed]

FINDINGS: Osseous mineralization normal.

Joint spaces preserved.

No fracture, dislocation, or bone destruction.

No definite elbow joint effusion.

Diffuse soft tissue swelling at LEFT elbow region.
IMPRESSION: Soft tissue swelling without definite acute bony findings.

## 2015-05-16 MED ORDER — PREDNISONE 10 MG PO TABS
ORAL_TABLET | ORAL | Status: DC
Start: 2015-05-16 — End: 2015-07-31

## 2015-05-16 MED ORDER — NAPROXEN 500 MG PO TABS
500.0000 mg | ORAL_TABLET | Freq: Once | ORAL | Status: AC
  Administered 2015-05-16: 500 mg via ORAL
  Filled 2015-05-16: qty 1

## 2015-05-16 MED ORDER — DOXYCYCLINE HYCLATE 100 MG PO CAPS: 100.0000 mg | ORAL_CAPSULE | Freq: Two times a day (BID) | ORAL | Status: DC

## 2015-05-16 NOTE — ED Notes (Signed)
Pt c/o bodyaches with chills and swelling to the left elbow for the past couple of weeks, no redness at the site.

## 2015-05-16 NOTE — Discharge Instructions (Signed)
IT IS VERY IMPORTANT YOU STOP SMOKING TAKE ALL DOXYCYCLINE AND TAKE ALL PREDNISONE TAPER WEAR ACE WRAP ON ELBOW FOLLOW UP WITH ORTHOPEDIST, CALL THE ONE LISTED ABOVE TODAY FOR CONSIDERATION OF ELBOW DRAINAGE RETURN TO THE ER FOR ANY ACUTELY WORSENING SYMPTOMS

## 2015-05-16 NOTE — ED Provider Notes (Signed)
CSN: [id number]     Arrival date & time 05/16/15  0906 History   First MD Initiated Contact with Patient 05/16/15 [phone]     Chief Complaint  Patient presents with  . Abscess  . Generalized Body Aches     HPI Comments: 49 year old male presents today with multiple complaints. He reports that he has had swelling to his elbow for the last 2 weeks. He thinks it comes from resting on his elbow all the time. He has not had any drainage from the area and is not warm to the touch. He does complain of generalized body aches but no measured fevers. No injury to the elbow. Patient is also concerned about cough and congestion over the past 2 weeks. Reports he has had wheezing and some chest tightness with a deep breath. He continues to smoke 2 packs per day of cigarettes. He does not have established primary care provider. His cough is productive of thick sputum. Not taking any over-the-counter medications for his cough.   The history is provided by the patient.    Past Medical History  Diagnosis Date  . Hypertension    Past Surgical History  Procedure Laterality Date  . Splenectomy    . Partial nephrectomy Left    No family history on file. Social History  Substance Use Topics  . Smoking status: Current Every Day Smoker  . Smokeless tobacco: None  . Alcohol Use: No    Review of Systems  Constitutional: Negative for fever and chills.  Respiratory: Positive for cough, chest tightness and wheezing. Negative for shortness of breath.   Cardiovascular: Negative for chest pain.  Musculoskeletal: Positive for myalgias and arthralgias.  Skin: Negative for rash and wound.  Neurological: Negative for weakness.  All other systems reviewed and are negative.     Allergies  Review of patient's allergies indicates no known allergies.  Home Medications   Prior to Admission medications   Medication Sig Start Date End Date Taking? Authorizing Provider  doxycycline (VIBRAMYCIN) 100 MG capsule  Take 1 capsule (100 mg total) by mouth 2 (two) times daily. 05/16/15   Luvenia ReddenEmma Weavil V, PA-C  lisinopril (PRINIVIL,ZESTRIL) 5 MG tablet Take 5 mg by mouth daily.    Historical Provider, MD  metoprolol tartrate (LOPRESSOR) 25 MG tablet Take 25 mg by mouth 2 (two) times daily.    Historical Provider, MD  penicillin v potassium (VEETID) 500 MG tablet Take 1 tablet (500 mg total) by mouth 3 (three) times daily. 01/20/15   Hannah Muthersbaugh, PA-C  predniSONE (DELTASONE) 10 MG tablet Taper: 6,5,4,3,2,1 05/16/15   Wilber OliphantEmma Weavil V, PA-C   BP 117/74 mmHg  Pulse 87  Temp(Src) 98.2 F (36.8 C) (Oral)  Resp 20  Ht 5\' 10"  (1.778 m)  Wt 240 lb (108.863 kg)  BMI 34.44 kg/m2  SpO2 95% Physical Exam  Constitutional: He is oriented to person, place, and time. Vital signs are normal. He appears well-developed and well-nourished. He is active.  Non-toxic appearance. He does not have a sickly appearance. He does not appear ill.  Appears older than stated age  HENT:  Head: Normocephalic and atraumatic.  Right Ear: Tympanic membrane and external ear normal.  Left Ear: Tympanic membrane and external ear normal.  Nose: Nose normal.  Mouth/Throat: Uvula is midline, oropharynx is clear and moist and mucous membranes are normal.  Eyes: Conjunctivae and EOM are normal. Pupils are equal, round, and reactive to light.  Neck: Normal range of motion. Neck supple.  Cardiovascular: Normal  rate, regular rhythm, normal heart sounds and intact distal pulses.  Exam reveals no gallop and no friction rub.   No murmur heard. Pulmonary/Chest: Effort normal. No respiratory distress. He has wheezes. He has no rales.  Musculoskeletal: He exhibits tenderness.       Left elbow: He exhibits swelling and effusion. He exhibits normal range of motion and no deformity. No tenderness found.  Effusion to left elbow. No warmth or erythema. Full range of motion intact  Lymphadenopathy:    He has no cervical adenopathy.  Neurological: He is  alert and oriented to person, place, and time.  Skin: Skin is warm and dry. There is erythema.  Psychiatric: He has a normal mood and affect. His behavior is normal. Judgment and thought content normal.  Nursing note and vitals reviewed.   ED Course  Procedures (including critical care time) Labs Review Labs Reviewed - No data to display  Imaging Review Dg Chest 2 View  05/16/2015  CLINICAL DATA:  Body aches and chills and swelling about the left elbow for approximately 2 weeks with mild cough. Initial encounter. EXAM: CHEST  2 VIEW COMPARISON:  PA and lateral chest 03/10/2015, 02/22/2015 and 06/19/2013. FINDINGS: There is peribronchial thickening bilaterally. The lungs are emphysematous. Cystic/bullous lesion in the left lower lobe is unchanged. No pneumothorax or pleural effusion. Heart size is normal. IMPRESSION: Findings consistent with COPD.  No acute abnormality. Electronically Signed   By: Drusilla Kanner M.D.   On: 05/16/2015 10:05   Dg Elbow Complete Left  05/16/2015  CLINICAL DATA:  Body aches, chills, and LEFT elbow swelling for the past couple weeks, slight cough for couple weeks, LEFT elbow pain EXAM: LEFT ELBOW - COMPLETE 3+ VIEW COMPARISON:  None FINDINGS: Osseous mineralization normal. Joint spaces preserved. No fracture, dislocation, or bone destruction. No definite elbow joint effusion. Diffuse soft tissue swelling at LEFT elbow region. IMPRESSION: Soft tissue swelling without definite acute bony findings. Electronically Signed   By: Ulyses Southward M.D.   On: 05/16/2015 09:59   I have personally reviewed and evaluated these images and lab results as part of my medical decision-making.   EKG Interpretation None      MDM  Exam consistent with a COPD exacerbation. No pneumonia seen on x-ray. Treat with doxycycline for bronchitis. Strongly encouraged patient to stop smoking. Prednisone 6 day taper to cover for left elbow bursitis as well as COPD exacerbation. Advised to wear  Ace wrap to left elbow, provided in the ER. Given follow-up information for orthopedics. Strongly encouraged patient to establish with a primary care provider Final diagnoses:  Cough  Elbow effusion, left  COPD exacerbation (HCC)  Bronchitis       Wilber Oliphant V, PA-C 05/16/15 1050  Gayla Doss, MD 05/17/15 1321

## 2015-05-16 NOTE — ED Notes (Signed)
States he has had body aches and noticed some pain in joints for about 1 week .Marland Kitchen. Swelling noted to left elbow   Denies any injury   No redness noted

## 2015-06-13 ENCOUNTER — Emergency Department
Admission: EM | Admit: 2015-06-13 | Discharge: 2015-06-13 | Discharge status: Against Medical Advice | Payer: Self-pay | Attending: Emergency Medicine | Admitting: Emergency Medicine

## 2015-06-13 ENCOUNTER — Encounter: Payer: Self-pay | Admitting: Emergency Medicine

## 2015-06-13 DIAGNOSIS — I10 Essential (primary) hypertension: Secondary | ICD-10-CM | POA: Insufficient documentation

## 2015-06-13 DIAGNOSIS — J441 Chronic obstructive pulmonary disease with (acute) exacerbation: Secondary | ICD-10-CM | POA: Insufficient documentation

## 2015-06-13 DIAGNOSIS — R059 Cough, unspecified: Secondary | ICD-10-CM

## 2015-06-13 DIAGNOSIS — Z792 Long term (current) use of antibiotics: Secondary | ICD-10-CM | POA: Insufficient documentation

## 2015-06-13 DIAGNOSIS — Z79899 Other long term (current) drug therapy: Secondary | ICD-10-CM | POA: Insufficient documentation

## 2015-06-13 DIAGNOSIS — F17200 Nicotine dependence, unspecified, uncomplicated: Secondary | ICD-10-CM | POA: Insufficient documentation

## 2015-06-13 DIAGNOSIS — R05 Cough: Secondary | ICD-10-CM

## 2015-06-13 DIAGNOSIS — Z5320 Procedure and treatment not carried out because of patient's decision for unspecified reasons: Secondary | ICD-10-CM

## 2015-06-13 DIAGNOSIS — Z5329 Procedure and treatment not carried out because of patient's decision for other reasons: Secondary | ICD-10-CM

## 2015-06-13 NOTE — ED Notes (Signed)
Emma PA in with pt .

## 2015-06-13 NOTE — ED Notes (Signed)
Pt presents with a cough and nasal drainage  for two weeks now. Denies any other symptoms at present time. Pt is a smoker.

## 2015-06-13 NOTE — ED Provider Notes (Signed)
CSN: [id number]     Arrival date & time 06/13/15  1136 History   First MD Initiated Contact with Patient 06/13/15 1223     Chief Complaint  Patient presents with  . Cough    HPI Comments: 49 year old male presents today complaining of cough and congestion for the past month. Patient was seen by this provider for the same on 05/16/15. At that point he had been sick for 2 weeks. At his last visit he was prescribed prednisone and doxycycline. He reports he does not feel much better since then. He has COPD and has reportedly cut his smoking from 2 packs per day to a half pack per day since his last visit here. He does not complain of any shortness of breath or chest pain. He reports he has a lot of pain in his right lower ribs due to coughing and he wants some pain medication for that.  The history is provided by the patient.    Past Medical History  Diagnosis Date  . Hypertension    Past Surgical History  Procedure Laterality Date  . Splenectomy    . Partial nephrectomy Left    No family history on file. Social History  Substance Use Topics  . Smoking status: Current Every Day Smoker  . Smokeless tobacco: None  . Alcohol Use: No    Review of Systems  Constitutional: Negative for fever and chills.  Respiratory: Positive for cough. Negative for shortness of breath and wheezing.   Musculoskeletal: Negative for myalgias and arthralgias.  Skin: Negative for rash.  Neurological: Negative for weakness.  All other systems reviewed and are negative.     Allergies  Review of patient's allergies indicates no known allergies.  Home Medications   Prior to Admission medications   Medication Sig Start Date End Date Taking? Authorizing Provider  doxycycline (VIBRAMYCIN) 100 MG capsule Take 1 capsule (100 mg total) by mouth 2 (two) times daily. 05/16/15   Luvenia Redden, PA-C  lisinopril (PRINIVIL,ZESTRIL) 5 MG tablet Take 5 mg by mouth daily.    Historical Provider, MD  metoprolol  tartrate (LOPRESSOR) 25 MG tablet Take 25 mg by mouth 2 (two) times daily.    Historical Provider, MD  penicillin v potassium (VEETID) 500 MG tablet Take 1 tablet (500 mg total) by mouth 3 (three) times daily. 01/20/15   Hannah Muthersbaugh, PA-C  predniSONE (DELTASONE) 10 MG tablet Taper: 6,5,4,3,2,1 05/16/15   Wilber Oliphant V, PA-C   BP 137/86 mmHg  Pulse 91  Temp(Src) 98.4 F (36.9 C) (Oral)  Resp 18  Ht  (1.778 m)  Wt 111.131 kg  BMI 35.15 kg/m2  SpO2 97% Physical Exam  Constitutional: He is oriented to person, place, and time. Vital signs are normal. He appears well-developed and well-nourished. He is active.  Non-toxic appearance. He does not have a sickly appearance. He does not appear ill.  Appears older than stated age  HENT:  Head: Normocephalic and atraumatic.  Right Ear: Tympanic membrane and external ear normal.  Left Ear: Tympanic membrane and external ear normal.  Nose: Mucosal edema and rhinorrhea present.  Mouth/Throat: Uvula is midline and mucous membranes are normal. Posterior oropharyngeal erythema present.  Eyes: Conjunctivae are normal. Pupils are equal, round, and reactive to light.  Neck: Normal range of motion. Neck supple.  Cardiovascular: Normal rate, regular rhythm, normal heart sounds and intact distal pulses.  Exam reveals no gallop and no friction rub.   No murmur heard. Pulmonary/Chest: Effort normal. No respiratory  distress. He has wheezes. He has no rales.  Musculoskeletal: Normal range of motion.  Lymphadenopathy:    He has no cervical adenopathy.  Neurological: He is alert and oriented to person, place, and time.  Skin: Skin is warm and dry.  Psychiatric: He has a normal mood and affect. His behavior is normal. Judgment and thought content normal.  Nursing note and vitals reviewed.   ED Course  Procedures (including critical care time) Labs Review Labs Reviewed - No data to display  Imaging Review No results found. I have personally  reviewed and evaluated these images and lab results as part of my medical decision-making.   EKG Interpretation None      MDM  Patient immediately requested pain medicine when I entered the room. He reports his right ribs hurt. I informed him that we would have to evaluate him before providing any pain medications. In addition with his severe lung disease pain medications were not indicated for a cough since they suppress the respiratory drive. I offered him anti-inflammatories, Tylenol and he refused. He became very angry and said he could take these medications at home and it was no point in coming here if we were not going to treat his pain. I explained him that I recommended we do a chest x-ray to evaluate his cough and ribs but he refused. He did not want to stay if I would not give him any narcotic pain medications. He eloped from the department and refused to sign AMA forms. Final diagnoses:  Left against medical advice  Cough        Luvenia Reddenmma Weavil V, PA-C 06/13/15 1247  Sharman CheekPhillip Stafford, MD 06/13/15 [phone]

## 2015-06-14 ENCOUNTER — Emergency Department (HOSPITAL_COMMUNITY): Payer: Self-pay

## 2015-06-14 ENCOUNTER — Emergency Department (HOSPITAL_COMMUNITY)
Admission: EM | Admit: 2015-06-14 | Discharge: 2015-06-14 | Disposition: A | Discharge status: Home / Self Care | Payer: Self-pay | Attending: Emergency Medicine | Admitting: Emergency Medicine

## 2015-06-14 ENCOUNTER — Encounter (HOSPITAL_COMMUNITY): Payer: Self-pay | Admitting: Emergency Medicine

## 2015-06-14 DIAGNOSIS — R0789 Other chest pain: Secondary | ICD-10-CM | POA: Insufficient documentation

## 2015-06-14 DIAGNOSIS — F17210 Nicotine dependence, cigarettes, uncomplicated: Secondary | ICD-10-CM | POA: Insufficient documentation

## 2015-06-14 DIAGNOSIS — R05 Cough: Secondary | ICD-10-CM | POA: Insufficient documentation

## 2015-06-14 DIAGNOSIS — J3489 Other specified disorders of nose and nasal sinuses: Secondary | ICD-10-CM | POA: Insufficient documentation

## 2015-06-14 DIAGNOSIS — I10 Essential (primary) hypertension: Secondary | ICD-10-CM | POA: Insufficient documentation

## 2015-06-14 DIAGNOSIS — R067 Sneezing: Secondary | ICD-10-CM | POA: Insufficient documentation

## 2015-06-14 DIAGNOSIS — Z792 Long term (current) use of antibiotics: Secondary | ICD-10-CM | POA: Insufficient documentation

## 2015-06-14 DIAGNOSIS — Z79899 Other long term (current) drug therapy: Secondary | ICD-10-CM | POA: Insufficient documentation

## 2015-06-14 DIAGNOSIS — J029 Acute pharyngitis, unspecified: Secondary | ICD-10-CM | POA: Insufficient documentation

## 2015-06-14 DIAGNOSIS — R059 Cough, unspecified: Secondary | ICD-10-CM

## 2015-06-14 IMAGING — CR DG CHEST 2V
2 series · 2 of 2 positions shown · non-contrast
Comparison: Chest radiograph performed [DATE]

CLINICAL DATA: Acute onset of cough and congestion. High blood
pressure. Shortness of breath. Initial encounter.

EXAM:
CHEST  2 VIEW

[chest pa]
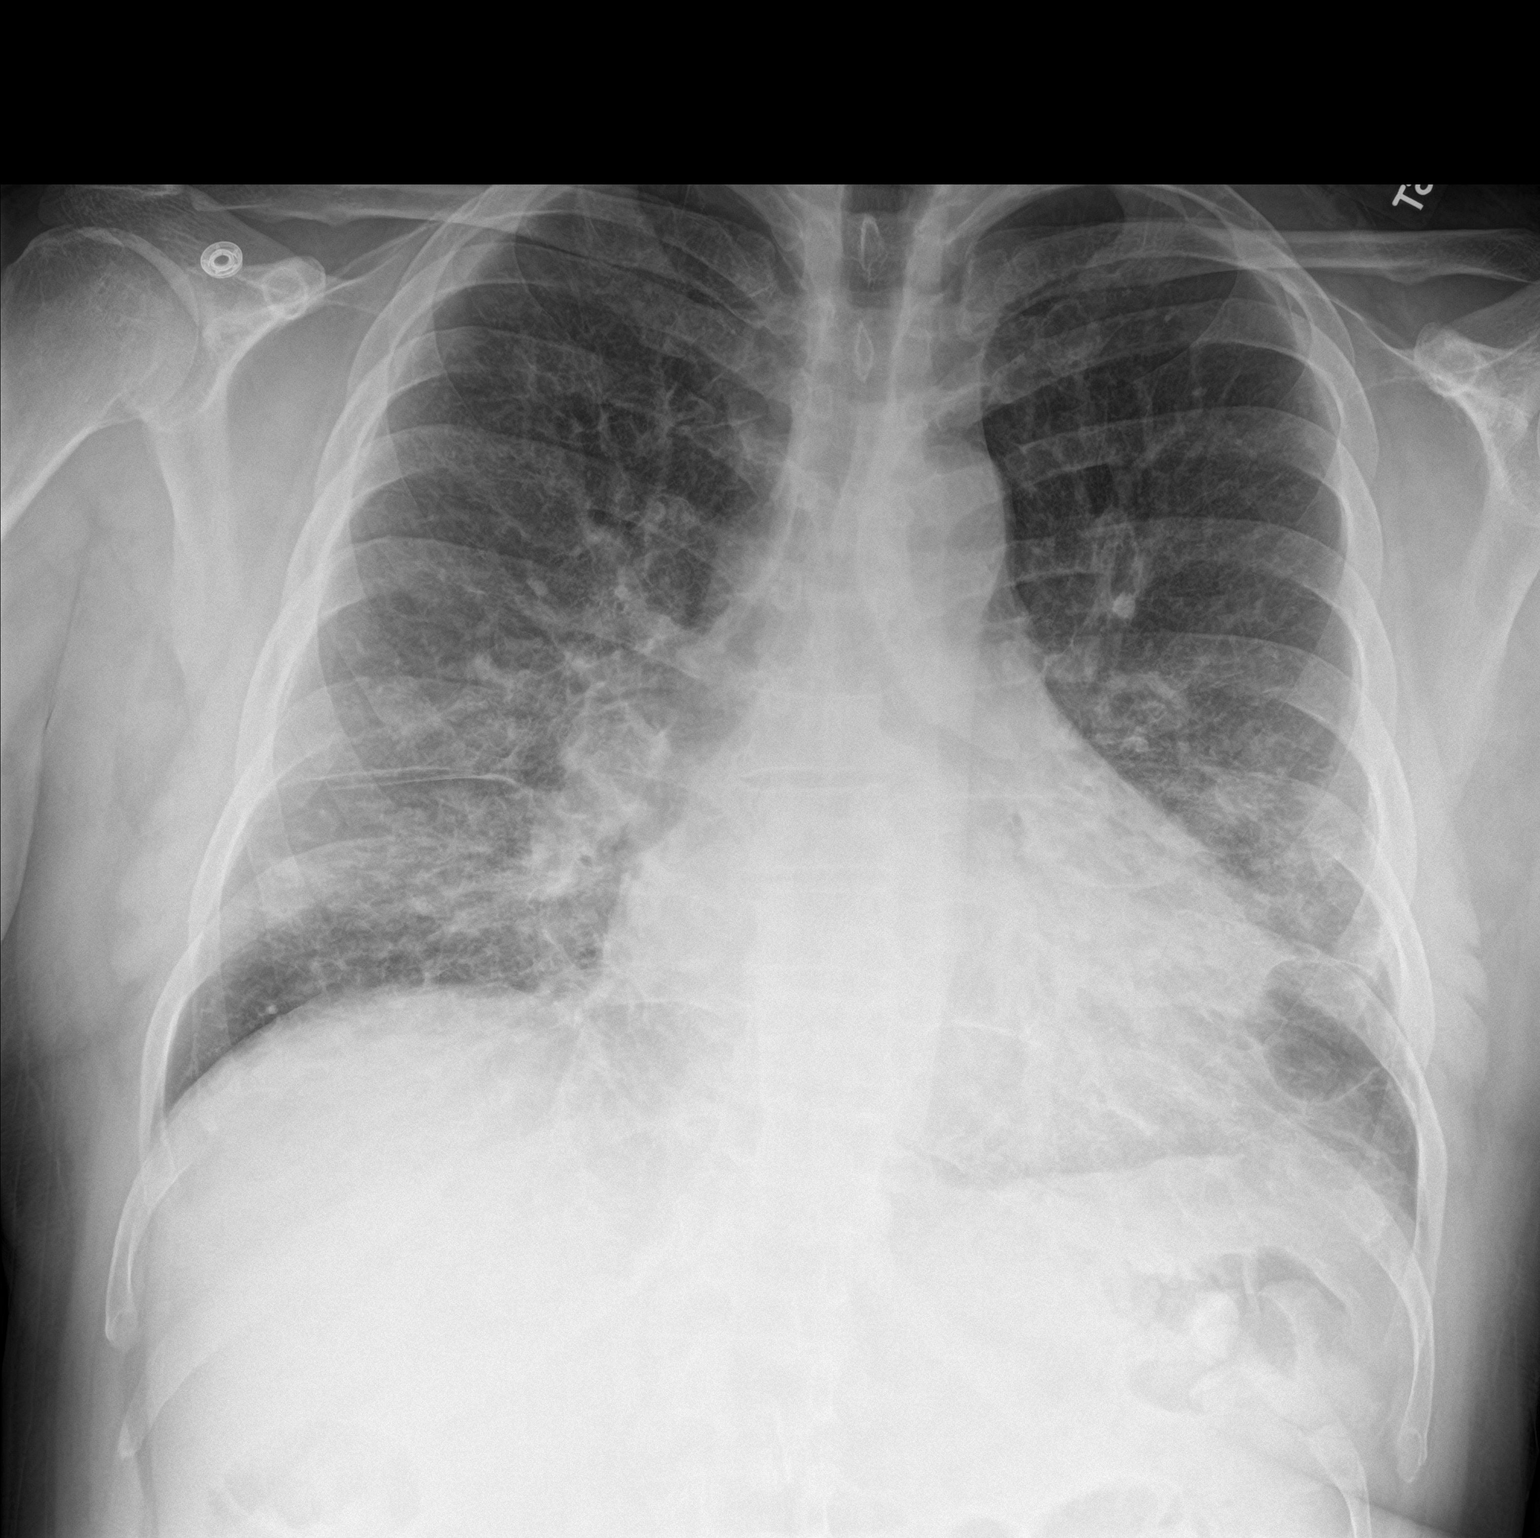

[chest lat]
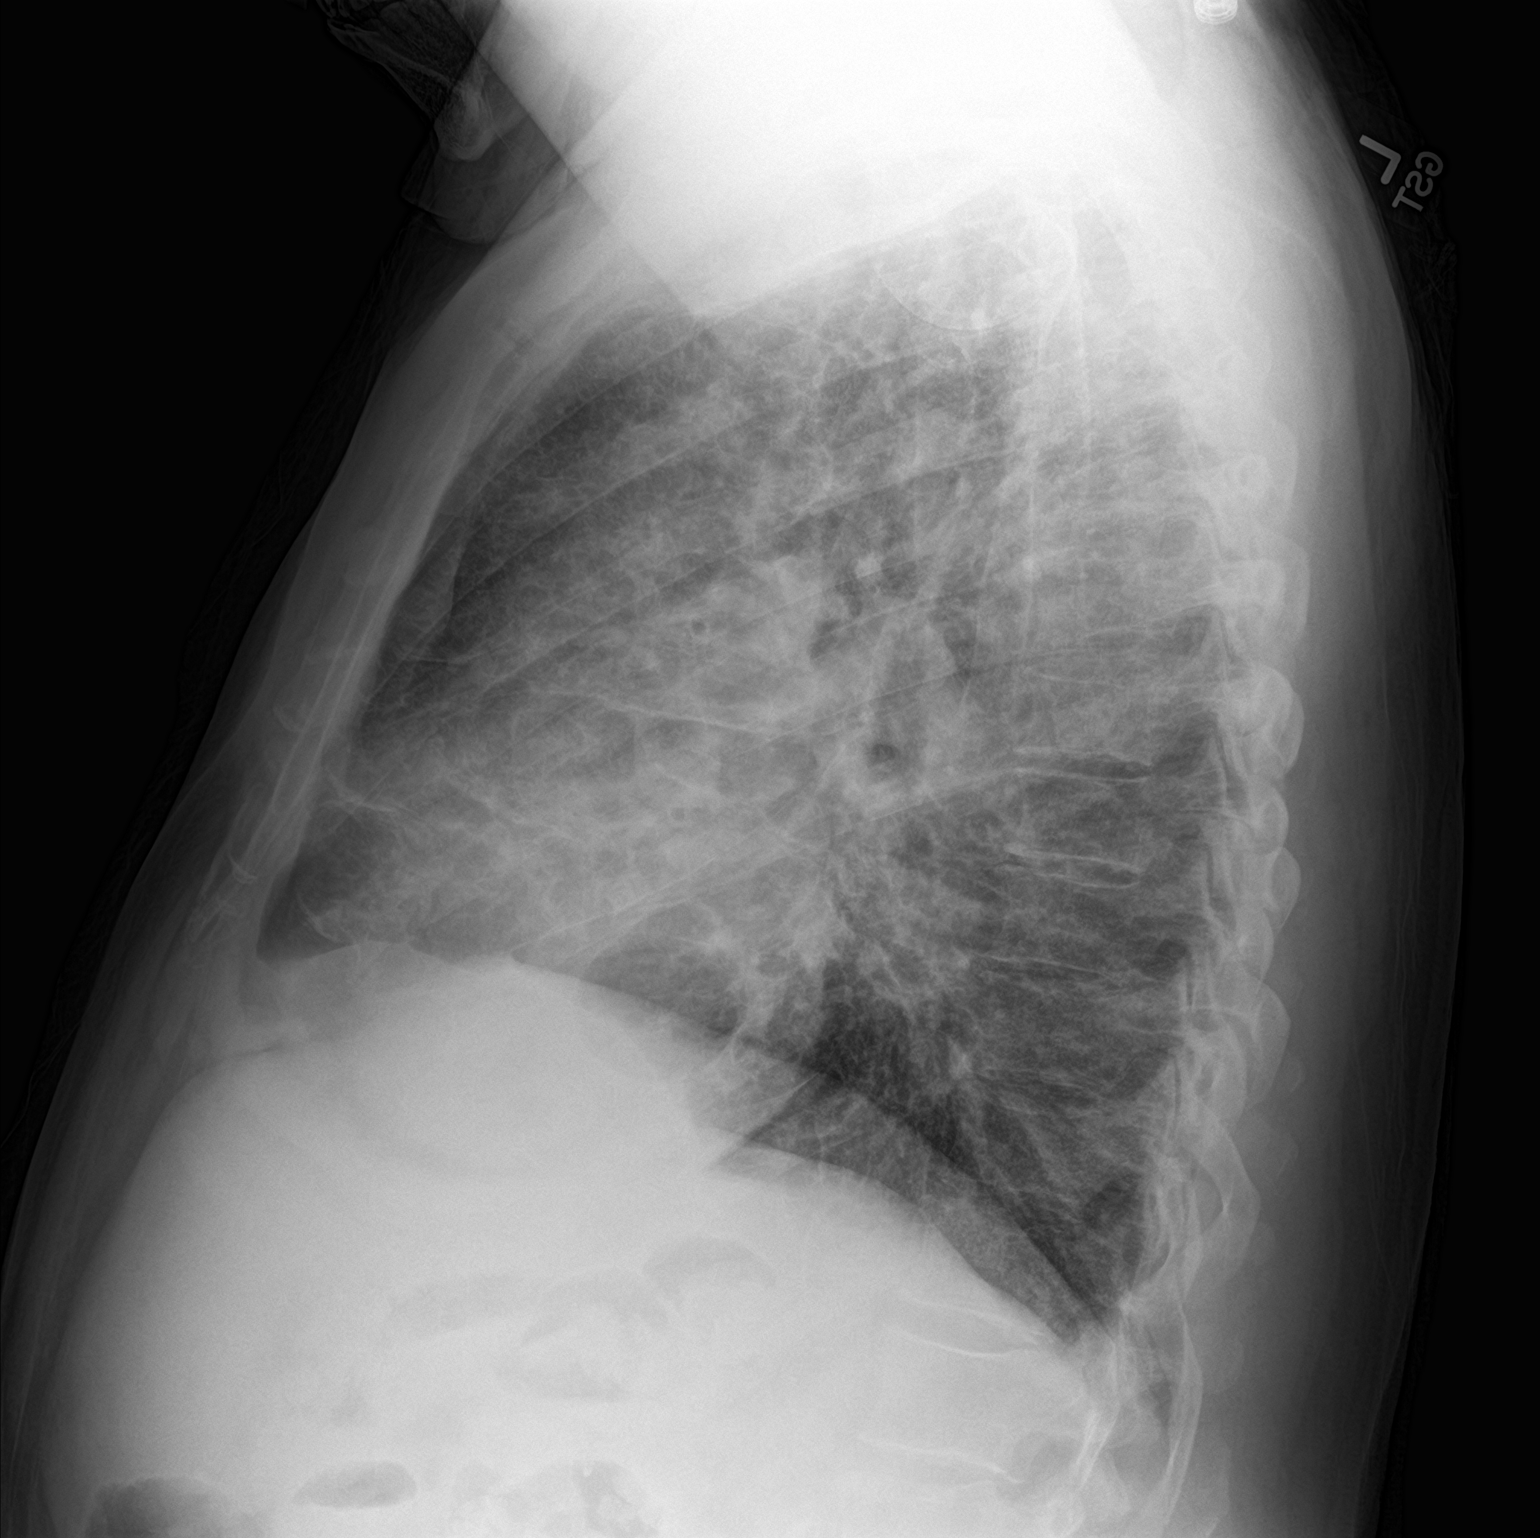

[2 of 2 positions shown; findings below may reference images not displayed]

FINDINGS: The lungs are well-aerated. Vascular congestion is noted. Diffusely
increased interstitial markings raise concern for pulmonary edema.
No pleural effusion or pneumothorax is seen.

The heart is borderline normal in size. No acute osseous
abnormalities are seen.
IMPRESSION: Vascular congestion noted. Diffusely increased interstitial markings
raise concern for pulmonary edema.

## 2015-06-14 MED ORDER — ALBUTEROL SULFATE HFA 108 (90 BASE) MCG/ACT IN AERS: 2.0000 | INHALATION_SPRAY | RESPIRATORY_TRACT | Status: DC | PRN

## 2015-06-14 MED ORDER — HYDROCODONE-HOMATROPINE 5-1.5 MG/5ML PO SYRP: 5.0000 mL | ORAL_SOLUTION | Freq: Four times a day (QID) | ORAL | Status: DC | PRN

## 2015-06-14 MED ORDER — AZITHROMYCIN 250 MG PO TABS: 250.0000 mg | ORAL_TABLET | Freq: Every day | ORAL | Status: DC

## 2015-06-14 MED ORDER — HYDROCODONE-HOMATROPINE 5-1.5 MG/5ML PO SYRP
5.0000 mL | ORAL_SOLUTION | Freq: Once | ORAL | Status: AC
  Administered 2015-06-14: 5 mL via ORAL
  Filled 2015-06-14: qty 5

## 2015-06-14 NOTE — ED Notes (Signed)
Pt. reports persistent productive cough with bloody phlegm for 1 week unrelieved by OTC Mucinex , pt. added pain at right lower back when coughing , denies fever or chills.

## 2015-06-14 NOTE — ED Notes (Signed)
Patient transported to X-ray 

## 2015-06-14 NOTE — ED Provider Notes (Signed)
CSN: [id number]     Arrival date & time 06/14/15  0510 History   First MD Initiated Contact with Patient 06/14/15 0559     Chief Complaint  Patient presents with  . Cough     (Consider location/radiation/quality/duration/timing/severity/associated sxs/prior Treatment) HPI Comments: Patient presents to the ED with a chief complaint of cough and rib pain.  He states that he has been coughing so much that he thinks he strained his ribs while coughing.  He states that he has had some productive cough and some blood tinged sputum.  He denies any fevers, chills, nausea or vomiting.  Denies any SOB.    The history is provided by the patient. No language interpreter was used.    Past Medical History  Diagnosis Date  . Hypertension    Past Surgical History  Procedure Laterality Date  . Splenectomy    . Partial nephrectomy Left    No family history on file. Social History  Substance Use Topics  . Smoking status: Current Every Day Smoker    Types: Cigarettes  . Smokeless tobacco: None  . Alcohol Use: No    Review of Systems  Constitutional: Negative for fever and chills.  HENT: Positive for postnasal drip, rhinorrhea, sinus pressure, sneezing and sore throat.   Respiratory: Positive for cough. Negative for shortness of breath.   Cardiovascular: Negative for chest pain.  Gastrointestinal: Negative for nausea, vomiting, abdominal pain, diarrhea and constipation.  Genitourinary: Negative for dysuria.  All other systems reviewed and are negative.     Allergies  Review of patient's allergies indicates no known allergies.  Home Medications   Prior to Admission medications   Medication Sig Start Date End Date Taking? Authorizing Provider  azithromycin (ZITHROMAX Z-PAK) 250 MG tablet Take 1 tablet (250 mg total) by mouth daily. 500mg  PO day 1, then 250mg  PO days 205 06/14/15   Roxy Horsemanobert Kelani Robart, PA-C  doxycycline (VIBRAMYCIN) 100 MG capsule Take 1 capsule (100 mg total) by mouth 2  (two) times daily. 05/16/15   Luvenia ReddenEmma Weavil V, PA-C  HYDROcodone-homatropine (HYCODAN) 5-1.5 MG/5ML syrup Take 5 mLs by mouth every 6 (six) hours as needed for cough. 06/14/15   Roxy Horsemanobert Ethell Blatchford, PA-C  lisinopril (PRINIVIL,ZESTRIL) 5 MG tablet Take 5 mg by mouth daily.    Historical Provider, MD  metoprolol tartrate (LOPRESSOR) 25 MG tablet Take 25 mg by mouth 2 (two) times daily.    Historical Provider, MD  penicillin v potassium (VEETID) 500 MG tablet Take 1 tablet (500 mg total) by mouth 3 (three) times daily. 01/20/15   Hannah Muthersbaugh, PA-C  predniSONE (DELTASONE) 10 MG tablet Taper: 6,5,4,3,2,1 05/16/15   Wilber OliphantEmma Weavil V, PA-C   BP 129/84 mmHg  Pulse 75  Temp(Src) 98 F (36.7 C) (Oral)  Resp 20  Ht 5\' 10"  (1.778 m)  Wt 110.224 kg  BMI 34.87 kg/m2  SpO2 99% Physical Exam  Constitutional: He is oriented to person, place, and time. He appears well-developed and well-nourished. No distress.  HENT:  Head: Normocephalic and atraumatic.  Right Ear: External ear normal.  Left Ear: External ear normal.  Mildly erythematous, no tonsillar exudate, no abscess, no stridor, uvula is midline  TMs clear bilaterally  Eyes: Conjunctivae and EOM are normal. Pupils are equal, round, and reactive to light. Right eye exhibits no discharge. Left eye exhibits no discharge. No scleral icterus.  Neck: Normal range of motion. Neck supple. No JVD present.  Cardiovascular: Normal rate, regular rhythm and normal heart sounds.  Exam reveals no  gallop and no friction rub.   No murmur heard. Pulmonary/Chest: Effort normal and breath sounds normal. No stridor. No respiratory distress. He has no wheezes. He has no rales. He exhibits no tenderness.  CTAB  Abdominal: Soft. Bowel sounds are normal. He exhibits no distension and no mass. There is no tenderness. There is no rebound and no guarding.  Musculoskeletal: Normal range of motion. He exhibits no edema or tenderness.  Neurological: He is alert and oriented to  person, place, and time.  Skin: Skin is warm and dry. No rash noted. He is not diaphoretic.  Psychiatric: He has a normal mood and affect. His behavior is normal. Judgment and thought content normal.  Nursing note and vitals reviewed.   ED Course  Procedures (including critical care time) Labs Review Labs Reviewed - No data to display  Imaging Review Dg Chest 2 View  06/14/2015  CLINICAL DATA:  Acute onset of cough and congestion. High blood pressure. Shortness of breath. Initial encounter. EXAM: CHEST  2 VIEW COMPARISON:  Chest radiograph performed 05/16/2015 FINDINGS: The lungs are well-aerated. Vascular congestion is noted. Diffusely increased interstitial markings raise concern for pulmonary edema. No pleural effusion or pneumothorax is seen. The heart is borderline normal in size. No acute osseous abnormalities are seen. IMPRESSION: Vascular congestion noted. Diffusely increased interstitial markings raise concern for pulmonary edema. Electronically Signed   By: Roanna Raider M.D.   On: 06/14/2015 06:10   I have personally reviewed and evaluated these images and lab results as part of my medical decision-making.   EKG Interpretation None      MDM   Final diagnoses:  Cough    Pt CXR negative for acute infiltrate. Patients symptoms are consistent with URI.  Given smoking hx and COPD will give zpak and inhaler.  Will give hycodan. Pt will be discharged with symptomatic treatment.  Verbalizes understanding and is agreeable with plan. Pt is hemodynamically stable & in NAD prior to dc.     Roxy Horseman, PA-C 06/14/15 1610  Tilden Fossa, MD 06/15/15 [phone]

## 2015-06-14 NOTE — Discharge Instructions (Signed)
Your chest x-ray showed findings consistent with vascular congestion. You need to follow-up with your doctor or a pulmonologist in the next week.    Upper Respiratory Infection, Adult Most upper respiratory infections (URIs) are a viral infection of the air passages leading to the lungs. A URI affects the nose, throat, and upper air passages. The most common type of URI is nasopharyngitis and is typically referred to as "the common cold." URIs run their course and usually go away on their own. Most of the time, a URI does not require medical attention, but sometimes a bacterial infection in the upper airways can follow a viral infection. This is called a secondary infection. Sinus and middle ear infections are common types of secondary upper respiratory infections. Bacterial pneumonia can also complicate a URI. A URI can worsen asthma and chronic obstructive pulmonary disease (COPD). Sometimes, these complications can require emergency medical care and may be life threatening.  CAUSES Almost all URIs are caused by viruses. A virus is a type of germ and can spread from one person to another.  RISKS FACTORS You may be at risk for a URI if:   You smoke.   You have chronic heart or lung disease.  You have a weakened defense (immune) system.   You are very young or very old.   You have nasal allergies or asthma.  You work in crowded or poorly ventilated areas.  You work in health care facilities or schools. SIGNS AND SYMPTOMS  Symptoms typically develop 2-3 days after you come in contact with a cold virus. Most viral URIs last 7-10 days. However, viral URIs from the influenza virus (flu virus) can last 14-18 days and are typically more severe. Symptoms may include:   Runny or stuffy (congested) nose.   Sneezing.   Cough.   Sore throat.   Headache.   Fatigue.   Fever.   Loss of appetite.   Pain in your forehead, behind your eyes, and over your cheekbones (sinus  pain).  Muscle aches.  DIAGNOSIS  Your health care provider may diagnose a URI by:  Physical exam.  Tests to check that your symptoms are not due to another condition such as:  Strep throat.  Sinusitis.  Pneumonia.  Asthma. TREATMENT  A URI goes away on its own with time. It cannot be cured with medicines, but medicines may be prescribed or recommended to relieve symptoms. Medicines may help:  Reduce your fever.  Reduce your cough.  Relieve nasal congestion. HOME CARE INSTRUCTIONS   Take medicines only as directed by your health care provider.   Gargle warm saltwater or take cough drops to comfort your throat as directed by your health care provider.  Use a warm mist humidifier or inhale steam from a shower to increase air moisture. This may make it easier to breathe.  Drink enough fluid to keep your urine clear or pale yellow.   Eat soups and other clear broths and maintain good nutrition.   Rest as needed.   Return to work when your temperature has returned to normal or as your health care provider advises. You may need to stay home longer to avoid infecting others. You can also use a face mask and careful hand washing to prevent spread of the virus.  Increase the usage of your inhaler if you have asthma.   Do not use any tobacco products, including cigarettes, chewing tobacco, or electronic cigarettes. If you need help quitting, ask your health care provider. PREVENTION  The best way to protect yourself from getting a cold is to practice good hygiene.   Avoid oral or hand contact with people with cold symptoms.   Wash your hands often if contact occurs.  There is no clear evidence that vitamin C, vitamin E, echinacea, or exercise reduces the chance of developing a cold. However, it is always recommended to get plenty of rest, exercise, and practice good nutrition.  SEEK MEDICAL CARE IF:   You are getting worse rather than better.   Your symptoms are  not controlled by medicine.   You have chills.  You have worsening shortness of breath.  You have brown or red mucus.  You have yellow or brown nasal discharge.  You have pain in your face, especially when you bend forward.  You have a fever.  You have swollen neck glands.  You have pain while swallowing.  You have white areas in the back of your throat. SEEK IMMEDIATE MEDICAL CARE IF:   You have severe or persistent:  Headache.  Ear pain.  Sinus pain.  Chest pain.  You have chronic lung disease and any of the following:  Wheezing.  Prolonged cough.  Coughing up blood.  A change in your usual mucus.  You have a stiff neck.  You have changes in your:  Vision.  Hearing.  Thinking.  Mood. MAKE SURE YOU:   Understand these instructions.  Will watch your condition.  Will get help right away if you are not doing well or get worse.   This information is not intended to replace advice given to you by your health care provider. Make sure you discuss any questions you have with your health care provider.   Document Released: 12/11/2000 Document Revised: 11/01/2014 Document Reviewed: 09/22/2013 Elsevier Interactive Patient Education [employer].

## 2015-07-31 ENCOUNTER — Encounter: Payer: Self-pay | Admitting: Emergency Medicine

## 2015-07-31 ENCOUNTER — Emergency Department
Admission: EM | Admit: 2015-07-31 | Discharge: 2015-07-31 | Disposition: A | Discharge status: Home / Self Care | Payer: Self-pay | Attending: Emergency Medicine | Admitting: Emergency Medicine

## 2015-07-31 DIAGNOSIS — M79641 Pain in right hand: Secondary | ICD-10-CM | POA: Insufficient documentation

## 2015-07-31 DIAGNOSIS — M25511 Pain in right shoulder: Secondary | ICD-10-CM

## 2015-07-31 DIAGNOSIS — I10 Essential (primary) hypertension: Secondary | ICD-10-CM | POA: Insufficient documentation

## 2015-07-31 DIAGNOSIS — F17210 Nicotine dependence, cigarettes, uncomplicated: Secondary | ICD-10-CM | POA: Insufficient documentation

## 2015-07-31 DIAGNOSIS — M25512 Pain in left shoulder: Secondary | ICD-10-CM | POA: Insufficient documentation

## 2015-07-31 DIAGNOSIS — M79642 Pain in left hand: Secondary | ICD-10-CM

## 2015-07-31 DIAGNOSIS — Z79899 Other long term (current) drug therapy: Secondary | ICD-10-CM | POA: Insufficient documentation

## 2015-07-31 MED ORDER — DICLOFENAC SODIUM 75 MG PO TBEC: 75.0000 mg | DELAYED_RELEASE_TABLET | Freq: Two times a day (BID) | ORAL | Status: DC

## 2015-07-31 MED ORDER — HYDROCODONE-ACETAMINOPHEN 5-325 MG PO TABS: 1.0000 | ORAL_TABLET | ORAL | Status: DC | PRN

## 2015-07-31 MED ORDER — CYCLOBENZAPRINE HCL 10 MG PO TABS: 10.0000 mg | ORAL_TABLET | Freq: Three times a day (TID) | ORAL | Status: DC | PRN

## 2015-07-31 MED ORDER — DIAZEPAM 5 MG/ML IJ SOLN
5.0000 mg | Freq: Once | INTRAMUSCULAR | Status: AC
  Administered 2015-07-31: 5 mg via INTRAMUSCULAR
  Filled 2015-07-31: qty 2

## 2015-07-31 MED ORDER — KETOROLAC TROMETHAMINE 60 MG/2ML IM SOLN
60.0000 mg | Freq: Once | INTRAMUSCULAR | Status: AC
  Administered 2015-07-31: 60 mg via INTRAMUSCULAR
  Filled 2015-07-31: qty 2

## 2015-07-31 MED ORDER — HYDROMORPHONE HCL 1 MG/ML IJ SOLN
1.0000 mg | Freq: Once | INTRAMUSCULAR | Status: AC
  Administered 2015-07-31: 1 mg via INTRAMUSCULAR
  Filled 2015-07-31: qty 1

## 2015-07-31 MED ORDER — OXYCODONE-ACETAMINOPHEN 5-325 MG PO TABS
1.0000 | ORAL_TABLET | ORAL | Status: DC | PRN
Start: 2015-07-31 — End: 2015-10-16

## 2015-07-31 NOTE — Discharge Instructions (Signed)
Heat Therapy Heat therapy can help ease sore, stiff, injured, and tight muscles and joints. Heat relaxes your muscles, which may help ease your pain. Heat therapy should only be used on old, pre-existing, or long-lasting (chronic) injuries. Do not use heat therapy unless told by your doctor. HOW TO USE HEAT THERAPY There are several different kinds of heat therapy, including:  Moist heat pack.  Warm water bath.  Hot water bottle.  Electric heating pad.  Heated gel pack.  Heated wrap.  Electric heating pad. GENERAL HEAT THERAPY RECOMMENDATIONS   Do not sleep while using heat therapy. Only use heat therapy while you are awake.  Your skin may turn pink while using heat therapy. Do not use heat therapy if your skin turns red.  Do not use heat therapy if you have new pain.  High heat or long exposure to heat can cause burns. Be careful when using heat therapy to avoid burning your skin.  Do not use heat therapy on areas of your skin that are already irritated, such as with a rash or sunburn. GET HELP IF:   You have blisters, redness, swelling (puffiness), or numbness.  You have new pain.  Your pain is worse. MAKE SURE YOU:  Understand these instructions.  Will watch your condition.  Will get help right away if you are not doing well or get worse.   This information is not intended to replace advice given to you by your health care provider. Make sure you discuss any questions you have with your health care provider.   Document Released: 09/09/2011 Document Revised: 07/08/2014 Document Reviewed: 08/10/2013 Elsevier Interactive Patient Education 2016 [employer]  Joint Pain Joint pain, which is also called arthralgia, can be caused by many things. Joint pain often goes away when you follow your health care provider's instructions for relieving pain at home. However, joint pain can also be caused by conditions that require further treatment. Common causes of joint pain  include:  Bruising in the area of the joint.  Overuse of the joint.  Wear and tear on the joints that occur with aging (osteoarthritis).  Various other forms of arthritis.  A buildup of a crystal form of uric acid in the joint (gout).  Infections of the joint (septic arthritis) or of the bone (osteomyelitis). Your health care provider may recommend medicine to help with the pain. If your joint pain continues, additional tests may be needed to diagnose your condition. HOME CARE INSTRUCTIONS Watch your condition for any changes. Follow these instructions as directed to lessen the pain that you are feeling.  Take medicines only as directed by your health care provider.  Rest the affected area for as long as your health care provider says that you should. If directed to do so, raise the painful joint above the level of your heart while you are sitting or lying down.  Do not do things that cause or worsen pain.  If directed, apply ice to the painful area:  Put ice in a plastic bag.  Place a towel between your skin and the bag.  Leave the ice on for 20 minutes, 2-3 times per day.  Wear an elastic bandage, splint, or sling as directed by your health care provider. Loosen the elastic bandage or splint if your fingers or toes become numb and tingle, or if they turn cold and blue.  Begin exercising or stretching the affected area as directed by your health care provider. Ask your health care provider what types  of exercise are safe for you.  Keep all follow-up visits as directed by your health care provider. This is important. SEEK MEDICAL CARE IF:  Your pain increases, and medicine does not help.  Your joint pain does not improve within 3 days.  You have increased bruising or swelling.  You have a fever.  You lose 10 lb (4.5 kg) or more without trying. SEEK IMMEDIATE MEDICAL CARE IF:  You are not able to move the joint.  Your fingers or toes become numb or they turn cold and  blue.   This information is not intended to replace advice given to you by your health care provider. Make sure you discuss any questions you have with your health care provider.   Document Released: 06/17/2005 Document Revised: 07/08/2014 Document Reviewed: 03/29/2014 Elsevier Interactive Patient Education 2016 Else[employer].  Shoulder Pain The shoulder is the joint that connects your arms to your body. The bones that form the shoulder joint include the upper arm bone (humerus), the shoulder blade (scapula), and the collarbone (clavicle). The top of the humerus is shaped like a ball and fits into a rather flat socket on the scapula (glenoid cavity). A combination of muscles and strong, fibrous tissues that connect muscles to bones (tendons) support your shoulder joint and hold the ball in the socket. Small, fluid-filled sacs (bursae) are located in different areas of the joint. They act as cushions between the bones and the overlying soft tissues and help reduce friction between the gliding tendons and the bone as you move your arm. Your shoulder joint allows a wide range of motion in your arm. This range of motion allows you to do things like scratch your back or throw a ball. However, this range of motion also makes your shoulder more prone to pain from overuse and injury. Causes of shoulder pain can originate from both injury and overuse and usually can be grouped in the following four categories:  Redness, swelling, and pain (inflammation) of the tendon (tendinitis) or the bursae (bursitis).  Instability, such as a dislocation of the joint.  Inflammation of the joint (arthritis).  Broken bone (fracture). HOME CARE INSTRUCTIONS   Apply ice to the sore area.  Put ice in a plastic bag.  Place a towel between your skin and the bag.  Leave the ice on for 15-20 minutes, 3-4 times per day for the first 2 days, or as directed by your health care provider.  Stop using cold packs if they do  not help with the pain.  If you have a shoulder sling or immobilizer, wear it as long as your caregiver instructs. Only remove it to shower or bathe. Move your arm as little as possible, but keep your hand moving to prevent swelling.  Squeeze a soft ball or foam pad as much as possible to help prevent swelling.  Only take over-the-counter or prescription medicines for pain, discomfort, or fever as directed by your caregiver. SEEK MEDICAL CARE IF:   Your shoulder pain increases, or new pain develops in your arm, hand, or fingers.  Your hand or fingers become cold and numb.  Your pain is not relieved with medicines. SEEK IMMEDIATE MEDICAL CARE IF:   Your arm, hand, or fingers are numb or tingling.  Your arm, hand, or fingers are significantly swollen or turn white or blue. MAKE SURE YOU:   Understand these instructions.  Will watch your condition.  Will get help right away if you are not doing well or get  worse.   This information is not intended to replace advice given to you by your health care provider. Make sure you discuss any questions you have with your health care provider.   Document Released: 03/27/2005 Document Revised: 07/08/2014 Document Reviewed: 10/10/2014 Elsevier Interactive Patient Education 2016 Else[employer] Inc.  Musculoskeletal Pain Musculoskeletal pain is muscle and boney aches and pains. These pains can occur in any part of the body. Your caregiver may treat you without knowing the cause of the pain. They may treat you if blood or urine tests, X-rays, and other tests were normal.  CAUSES There is often not a definite cause or reason for these pains. These pains may be caused by a type of germ (virus). The discomfort may also come from overuse. Overuse includes working out too hard when your body is not fit. Boney aches also come from weather changes. Bone is sensitive to atmospheric pressure changes. HOME CARE INSTRUCTIONS   Ask when your test results will be  ready. Make sure you get your test results.  Only take over-the-counter or prescription medicines for pain, discomfort, or fever as directed by your caregiver. If you were given medications for your condition, do not drive, operate machinery or power tools, or sign legal documents for 24 hours. Do not drink alcohol. Do not take sleeping pills or other medications that may interfere with treatment.  Continue all activities unless the activities cause more pain. When the pain lessens, slowly resume normal activities. Gradually increase the intensity and duration of the activities or exercise.  During periods of severe pain, bed rest may be helpful. Lay or sit in any position that is comfortable.  Putting ice on the injured area.  Put ice in a bag.  Place a towel between your skin and the bag.  Leave the ice on for 15 to 20 minutes, 3 to 4 times a day.  Follow up with your caregiver for continued problems and no reason can be found for the pain. If the pain becomes worse or does not go away, it may be necessary to repeat tests or do additional testing. Your caregiver may need to look further for a possible cause. SEEK IMMEDIATE MEDICAL CARE IF:  You have pain that is getting worse and is not relieved by medications.  You develop chest pain that is associated with shortness or breath, sweating, feeling sick to your stomach (nauseous), or throw up (vomit).  Your pain becomes localized to the abdomen.  You develop any new symptoms that seem different or that concern you. MAKE SURE YOU:   Understand these instructions.  Will watch your condition.  Will get help right away if you are not doing well or get worse.   This information is not intended to replace advice given to you by your health care provider. Make sure you discuss any questions you have with your health care provider.   Document Released: 06/17/2005 Document Revised: 09/09/2011 Document Reviewed: 02/19/2013 Elsevier  Interactive Patient Education [employer]

## 2015-07-31 NOTE — ED Notes (Signed)
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

## 2015-07-31 NOTE — ED Notes (Signed)
Pt to ed with c/o left hand pain, left shoulder pain, right shoulder pain and back pain that started about 1 week ago.  Pt denies injury but states pain increases with ROM in right and left shoulders.  Pt states he is unable to lift arms above his head.  Pt states all over aching with movement. Pt muscles in upper arms are tender to touch and palpation.  Pt reports known hx of herniated disc in neck.

## 2015-07-31 NOTE — ED Provider Notes (Signed)
[facility] Emergency Department Provider Note  ____________________________________________  Time seen: Approximately 11:15 AM  I have reviewed the triage vital signs and the nursing notes.   HISTORY  Chief Complaint Hand Pain    HPI Brian Porter is a 50 y.o. male who presents for evaluation of pain in both his arms shoulders neck. Complains of swelling to his left hand with decreased range of motion. States unable to lift his arms per lower above the length of the shoulders. His past medical history is for hypertension. 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 Hypertension     Patient Active Problem List   Diagnosis Date Noted  . Chest pain 02/22/2015  . Chest pain at rest 02/22/2015    Past Surgical History  Procedure Laterality Date  . Splenectomy    . Partial nephrectomy Left     Current Outpatient Rx  Name  Route  Sig  Dispense  Refill  . cyclobenzaprine (FLEXERIL) 10 MG tablet   Oral   Take 1 tablet (10 mg total) by mouth every 8 (eight) hours as needed for muscle spasms.   30 tablet   1   . diclofenac (VOLTAREN) 75 MG EC tablet   Oral   Take 1 tablet (75 mg total) by mouth 2 (two) times daily.   60 tablet   0   . HYDROcodone-acetaminophen (NORCO) 5-325 MG tablet   Oral   Take 1-2 tablets by mouth every 4 (four) hours as needed for moderate pain.   15 tablet   0   . lisinopril (PRINIVIL,ZESTRIL) 5 MG tablet   Oral   Take 5 mg by mouth daily.         . metoprolol tartrate (LOPRESSOR) 25 MG tablet   Oral   Take 25 mg by mouth 2 (two) times daily.           Allergies Review of patient's allergies indicates no known allergies.  History reviewed. No pertinent family history.  Social History Social History  Substance Use Topics  . Smoking status: Current Every Day Smoker    Types: Cigarettes  . Smokeless tobacco: None  . Alcohol Use: No    Review of  Systems Constitutional: No fever/chills Eyes: No visual changes. ENT: No sore throat. Cardiovascular: Denies chest pain. Respiratory: Denies shortness of breath. Gastrointestinal: No abdominal pain.  No nausea, no vomiting.  No diarrhea.  No constipation. Genitourinary: Negative for dysuria. Musculoskeletal: Positive for shoulder bilateral arm and hand pain. Skin: Negative for rash. Neurological: Negative for headaches, focal weakness or numbness.  10-point ROS otherwise negative.  ____________________________________________   PHYSICAL EXAM:  VITAL SIGNS: ED Triage Vitals  Enc Vitals Group     BP 07/31/15 1009 125/72 mmHg     Pulse Rate 07/31/15 1009 95     Resp 07/31/15 1009 18     Temp 07/31/15 1009 97.8 F (36.6 C)     Temp Source 07/31/15 1009 Oral     SpO2 07/31/15 1009 97 %     Weight 07/31/15 1009 250 lb (113.399 kg)     Height 07/31/15 1009  (1.778 m)     Head Cir --      Peak Flow --      Pain Score 07/31/15 1021 10     Pain Loc --      Pain Edu? --      Excl. in GC? --  Constitutional: Alert and oriented. Well appearing and in no acute distress. Head: Atraumatic. Nose: No congestion/rhinnorhea. Mouth/Throat: Mucous membranes are moist.  Oropharynx non-erythematous. Neck: No stridor.   Cardiovascular: Normal rate, regular rhythm. Grossly normal heart sounds.  Good peripheral circulation. Respiratory: Normal respiratory effort.  No retractions. Lungs CTAB. Musculoskeletal: Both arms with limited range of motion increased pain with abduction and adduction extension and flexion. Has positive on the left. Distally neurovascularly intact bilaterally. Neurologic:  Normal speech and language. No gross focal neurologic deficits are appreciated. No gait instability. Skin:  Skin is warm, dry and intact. No rash noted. Psychiatric: Mood and affect are normal. Speech and behavior are normal.  ____________________________________________   LABS (all labs  ordered are listed, but only abnormal results are displayed)  Labs Reviewed - No data to display    PROCEDURES  Procedure(s) performed: None  Critical Care performed: No  ____________________________________________   INITIAL IMPRESSION / ASSESSMENT AND PLAN / ED COURSE  Pertinent labs & imaging results that were available during my care of the patient were reviewed by me and considered in my medical decision making (see chart for details).  Bilateral upper extremity myalgias secondary to repetitive use with a pressure washer. Rx given for Flexeril 10 mg 3 times a day, diclofenac sodium, and Vicodin 5/325. Patient to follow up with PCP or return to the ER with any worsening symptomology. Encouraged patient to see his primary care provider for additional testing if needed. ____________________________________________   FINAL CLINICAL IMPRESSION(S) / ED DIAGNOSES  Final diagnoses:  Bilateral shoulder pain  Pain of left hand     This chart was dictated using voice recognition software/Dragon. Despite best efforts to proofread, errors can occur which can change the meaning. Any change was purely unintentional.   Evangeline Dakin, PA-C 07/31/15 1301  Phineas Semen, MD 07/31/15 [phone]

## 2015-08-01 ENCOUNTER — Emergency Department
Admission: EM | Admit: 2015-08-01 | Discharge: 2015-08-01 | Disposition: A | Discharge status: Home / Self Care | Payer: Self-pay | Attending: Emergency Medicine | Admitting: Emergency Medicine

## 2015-08-01 ENCOUNTER — Emergency Department: Payer: Self-pay

## 2015-08-01 ENCOUNTER — Encounter: Payer: Self-pay | Admitting: *Deleted

## 2015-08-01 DIAGNOSIS — M25572 Pain in left ankle and joints of left foot: Secondary | ICD-10-CM | POA: Insufficient documentation

## 2015-08-01 DIAGNOSIS — F17210 Nicotine dependence, cigarettes, uncomplicated: Secondary | ICD-10-CM | POA: Insufficient documentation

## 2015-08-01 DIAGNOSIS — I10 Essential (primary) hypertension: Secondary | ICD-10-CM | POA: Insufficient documentation

## 2015-08-01 DIAGNOSIS — Z79899 Other long term (current) drug therapy: Secondary | ICD-10-CM | POA: Insufficient documentation

## 2015-08-01 DIAGNOSIS — Z791 Long term (current) use of non-steroidal anti-inflammatories (NSAID): Secondary | ICD-10-CM | POA: Insufficient documentation

## 2015-08-01 DIAGNOSIS — R609 Edema, unspecified: Secondary | ICD-10-CM | POA: Insufficient documentation
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
## 2015-08-01 NOTE — ED Notes (Signed)
PA went in to review XR results with pt and discuss discharge information but pt was not in room.

## 2015-08-01 NOTE — ED Notes (Signed)
Pt to triage via wheelchair. Pt reports left ankle pain.  No known injury.

## 2015-08-01 NOTE — ED Provider Notes (Signed)
[facility] Emergency Department Provider Note  ____________________________________________  Time seen: Approximately 6:13 PM  I have reviewed the triage vital signs and the nursing notes.   HISTORY  Chief Complaint Ankle Pain    HPI Brian Porter is a 50 y.o. male, NAD, presents emergency Department with complaints of left ankle pain over the last couple of hours. States he's had no falls, injuries, traumas. Pain started when he injured his vehicle leaving work. Pain increases with ambulation. States he is unable to bear weight on the left ankle. Has had some chronic mild swelling of bilateral lower extremities after he is on his feet. Notes the swelling decreases when he wakes in the morning. Denies any redness, warmth. No skin sores or lesions.   Past Medical History  Diagnosis Date  . Hypertension     Patient Active Problem List   Diagnosis Date Noted  . Chest pain 02/22/2015  . Chest pain at rest 02/22/2015    Past Surgical History  Procedure Laterality Date  . Splenectomy    . Partial nephrectomy Left     Current Outpatient Rx  Name  Route  Sig  Dispense  Refill  . cyclobenzaprine (FLEXERIL) 10 MG tablet   Oral   Take 1 tablet (10 mg total) by mouth every 8 (eight) hours as needed for muscle spasms.   30 tablet   1   . diclofenac (VOLTAREN) 75 MG EC tablet   Oral   Take 1 tablet (75 mg total) by mouth 2 (two) times daily.   60 tablet   0   . lisinopril (PRINIVIL,ZESTRIL) 5 MG tablet   Oral   Take 5 mg by mouth daily.         . metoprolol tartrate (LOPRESSOR) 25 MG tablet   Oral   Take 25 mg by mouth 2 (two) times daily.         Marland Kitchen oxyCODONE-acetaminophen (ROXICET) 5-325 MG tablet   Oral   Take 1-2 tablets by mouth every 4 (four) hours as needed for severe pain.   15 tablet   0     Allergies Review of patient's allergies indicates no known allergies.  No family history on file.  Social History Social History   Substance Use Topics  . Smoking status: Current Every Day Smoker    Types: Cigarettes  . Smokeless tobacco: None  . Alcohol Use: No     Review of Systems  Constitutional: No fever/chills. No fatigue.  Eyes: No visual changes.  Cardiovascular: No chest pain, palpitations.  Respiratory: No shortness of breath.  Gastrointestinal: No abdominal pain.  No nausea, vomiting.  Musculoskeletal: Positive for left ankle pain. Bilateral LLE edema. Negative for back pain.  Skin: Negative for rash, lesions.  Neurological: Negative for headaches, focal weakness or numbness. No tingling.  10-point ROS otherwise negative.  ____________________________________________   PHYSICAL EXAM:  VITAL SIGNS: ED Triage Vitals  Enc Vitals Group     BP --      Pulse Rate 08/01/15 1747 98     Resp 08/01/15 1747 20     Temp 08/01/15 1747 98.1 F (36.7 C)     Temp Source 08/01/15 1747 Oral     SpO2 08/01/15 1747 95 %     Weight 08/01/15 1747 245 lb (111.131 kg)     Height 08/01/15 1747  (1.778 m)     Head Cir --      Peak Flow --      Pain Score  08/01/15 1748 10     Pain Loc --      Pain Edu? --      Excl. in GC? --     Constitutional: Alert and oriented. Well appearing and in no acute distress. Eyes: Conjunctivae are normal. PERRL.  Head: Atraumatic. Cardiovascular: Normal rate, regular rhythm. Normal S1 and S2.  Good peripheral circulation. Respiratory: Normal respiratory effort without tachypnea or retractions. Lungs CTAB. Gastrointestinal: Soft and nontender. No distention. No CVA tenderness. Musculoskeletal: Left medial ankle tenderness to palpation. FROM. Trace edema noted in bilateral lower extremities. 2+ pulses bilaterally.  Neurologic:  Normal speech and language. No gross focal neurologic deficits are appreciated.  Skin:  Skin is warm, dry and intact. No rash noted. No ecchymosis.  Psychiatric: Mood and affect are normal. Speech and behavior are normal. Patient exhibits  appropriate insight and judgement.   ____________________________________________   LABS (all labs ordered are listed, but only abnormal results are displayed)  Labs Reviewed - No data to display ____________________________________________  EKG  None ____________________________________________  RADIOLOGY I have personally viewed and evaluated these images (plain radiographs) as part of my medical decision making, as well as reviewing the written report by the radiologist.  Dg Ankle Complete Left  08/01/2015  CLINICAL DATA:  Left ankle pain today while driving. No known injury. EXAM: LEFT ANKLE COMPLETE - 3+ VIEW COMPARISON:  Foot radiographs 09/21/2013. FINDINGS: The ankle mortise is maintained. No acute ankle fracture or osteochondral abnormality. No obvious joint effusion. The mid and hindfoot bony structures are intact. IMPRESSION: No acute bony findings or significant degenerative changes. Electronically Signed   By: Rudie Meyer M.D.   On: 08/01/2015 19:02    ____________________________________________    PROCEDURES  Procedure(s) performed: None    Medications - No data to display   ____________________________________________   INITIAL IMPRESSION / ASSESSMENT AND PLAN / ED COURSE  Pertinent imaging results that were available during my care of the patient were reviewed by me and considered in my medical decision making (see chart for details).  Patient was visualized by myself and nursing staff ambulating without assistance, nor gait abnormality, on the left ankle while awaiting xray results.   Patient's diagnosis is consistent with left ankle pain. Patient will be discharged home with instructions to utilize pain medications prescribed to him from this ED yesterday. Patient is to follow up with [facility] if symptoms persist past this treatment course. Patient is given ED precautions to return to the ED for any worsening or new symptoms.     ____________________________________________  FINAL CLINICAL IMPRESSION(S) / ED DIAGNOSES  Final diagnoses:  Left ankle pain      NEW MEDICATIONS STARTED DURING THIS VISIT:  New Prescriptions   No medications on file         Hope Pigeon, PA-C 08/01/15 1923  Sharyn Creamer, MD 08/01/15 2046

## 2015-08-01 NOTE — Discharge Instructions (Signed)
Ankle Pain °Ankle pain is a common symptom. The bones, cartilage, tendons, and muscles of the ankle joint perform a lot of work each day. The ankle joint holds your body weight and allows you to move around. Ankle pain can occur on either side or back of 1 or both ankles. Ankle pain may be sharp and burning or dull and aching. There may be tenderness, stiffness, redness, or warmth around the ankle. The pain occurs more often when a person walks or puts pressure on the ankle. °CAUSES  °There are many reasons ankle pain can develop. It is important to work with your caregiver to identify the cause since many conditions can impact the bones, cartilage, muscles, and tendons. Causes for ankle pain include: °· Injury, including a break (fracture), sprain, or strain often due to a fall, sports, or a high-impact activity. °· Swelling (inflammation) of a tendon (tendonitis). °· Achilles tendon rupture. °· Ankle instability after repeated sprains and strains. °· Poor foot alignment. °· Pressure on a nerve (tarsal tunnel syndrome). °· Arthritis in the ankle or the lining of the ankle. °· Crystal formation in the ankle (gout or pseudogout). °DIAGNOSIS  °A diagnosis is based on your medical history, your symptoms, results of your physical exam, and results of diagnostic tests. Diagnostic tests may include X-ray exams or a computerized magnetic scan (magnetic resonance imaging, MRI). °TREATMENT  °Treatment will depend on the cause of your ankle pain and may include: °· Keeping pressure off the ankle and limiting activities. °· Using crutches or other walking support (a cane or brace). °· Using rest, ice, compression, and elevation. °· Participating in physical therapy or home exercises. °· Wearing shoe inserts or special shoes. °· Losing weight. °· Taking medications to reduce pain or swelling or receiving an injection. °· Undergoing surgery. °HOME CARE INSTRUCTIONS  °· Only take over-the-counter or prescription medicines for  pain, discomfort, or fever as directed by your caregiver. °· Put ice on the injured area. °· Put ice in a plastic bag. °· Place a towel between your skin and the bag. °· Leave the ice on for 15-20 minutes at a time, 03-04 times a day. °· Keep your leg raised (elevated) when possible to lessen swelling. °· Avoid activities that cause ankle pain. °· Follow specific exercises as directed by your caregiver. °· Record how often you have ankle pain, the location of the pain, and what it feels like. This information may be helpful to you and your caregiver. °· Ask your caregiver about returning to work or sports and whether you should drive. °· Follow up with your caregiver for further examination, therapy, or testing as directed. °SEEK MEDICAL CARE IF:  °· Pain or swelling continues or worsens beyond 1 week. °· You have an oral temperature above 102° F (38.9° C). °· You are feeling unwell or have chills. °· You are having an increasingly difficult time with walking. °· You have loss of sensation or other new symptoms. °· You have questions or concerns. °MAKE SURE YOU:  °· Understand these instructions. °· Will watch your condition. °· Will get help right away if you are not doing well or get worse. °  °This information is not intended to replace advice given to you by your health care provider. Make sure you discuss any questions you have with your health care provider. °  °Document Released: 12/05/2009 Document Revised: 09/09/2011 Document Reviewed: 01/17/2015 °Elsevier Interactive Patient Education ©2016 [employer] ° °Cryotherapy °Cryotherapy is when you put ice on   your injury. Ice helps lessen pain and puffiness (swelling) after an injury. Ice works the best when you start using it in the first 24 to 48 hours after an injury. °HOME CARE °· Put a dry or damp towel between the ice pack and your skin. °· You may press gently on the ice pack. °· Leave the ice on for no more than 10 to 20 minutes at a time. °· Check your  skin after 5 minutes to make sure your skin is okay. °· Rest at least 20 minutes between ice pack uses. °· Stop using ice when your skin loses feeling (numbness). °· Do not use ice on someone who cannot tell you when it hurts. This includes small children and people with memory problems (dementia). °GET HELP RIGHT AWAY IF: °· You have white spots on your skin. °· Your skin turns blue or pale. °· Your skin feels waxy or hard. °· Your puffiness gets worse. °MAKE SURE YOU:  °· Understand these instructions. °· Will watch your condition. °· Will get help right away if you are not doing well or get worse. °  °This information is not intended to replace advice given to you by your health care provider. Make sure you discuss any questions you have with your health care provider. °  °Document Released: 12/04/2007 Document Revised: 09/09/2011 Document Reviewed: 02/07/2011 °Elsevier Interactive Patient Education ©2016 Elsevier Inc[employer] ° °

## 2015-08-03 ENCOUNTER — Emergency Department (HOSPITAL_COMMUNITY): Payer: Self-pay

## 2015-08-03 ENCOUNTER — Encounter (HOSPITAL_COMMUNITY): Payer: Self-pay | Admitting: Emergency Medicine

## 2015-08-03 ENCOUNTER — Emergency Department (HOSPITAL_COMMUNITY)
Admission: EM | Admit: 2015-08-03 | Discharge: 2015-08-03 | Discharge status: Against Medical Advice | Payer: Self-pay | Attending: Emergency Medicine | Admitting: Emergency Medicine

## 2015-08-03 DIAGNOSIS — G8929 Other chronic pain: Secondary | ICD-10-CM

## 2015-08-03 DIAGNOSIS — R200 Anesthesia of skin: Secondary | ICD-10-CM | POA: Insufficient documentation

## 2015-08-03 DIAGNOSIS — F17210 Nicotine dependence, cigarettes, uncomplicated: Secondary | ICD-10-CM | POA: Insufficient documentation

## 2015-08-03 DIAGNOSIS — I10 Essential (primary) hypertension: Secondary | ICD-10-CM | POA: Insufficient documentation

## 2015-08-03 DIAGNOSIS — M25511 Pain in right shoulder: Secondary | ICD-10-CM | POA: Insufficient documentation

## 2015-08-03 IMAGING — CR DG SHOULDER 2+V*R*
3 series · 3 of 3 positions shown · non-contrast
Comparison: None.

CLINICAL DATA: NO INJURY.PAIN RIGHT SHOULDER JOINT FOR 1 WEEK.PAIN
RADIATES DOWN RIGHT ARM.

EXAM:
RIGHT SHOULDER - 2+ VIEW

[shoulder grashey]
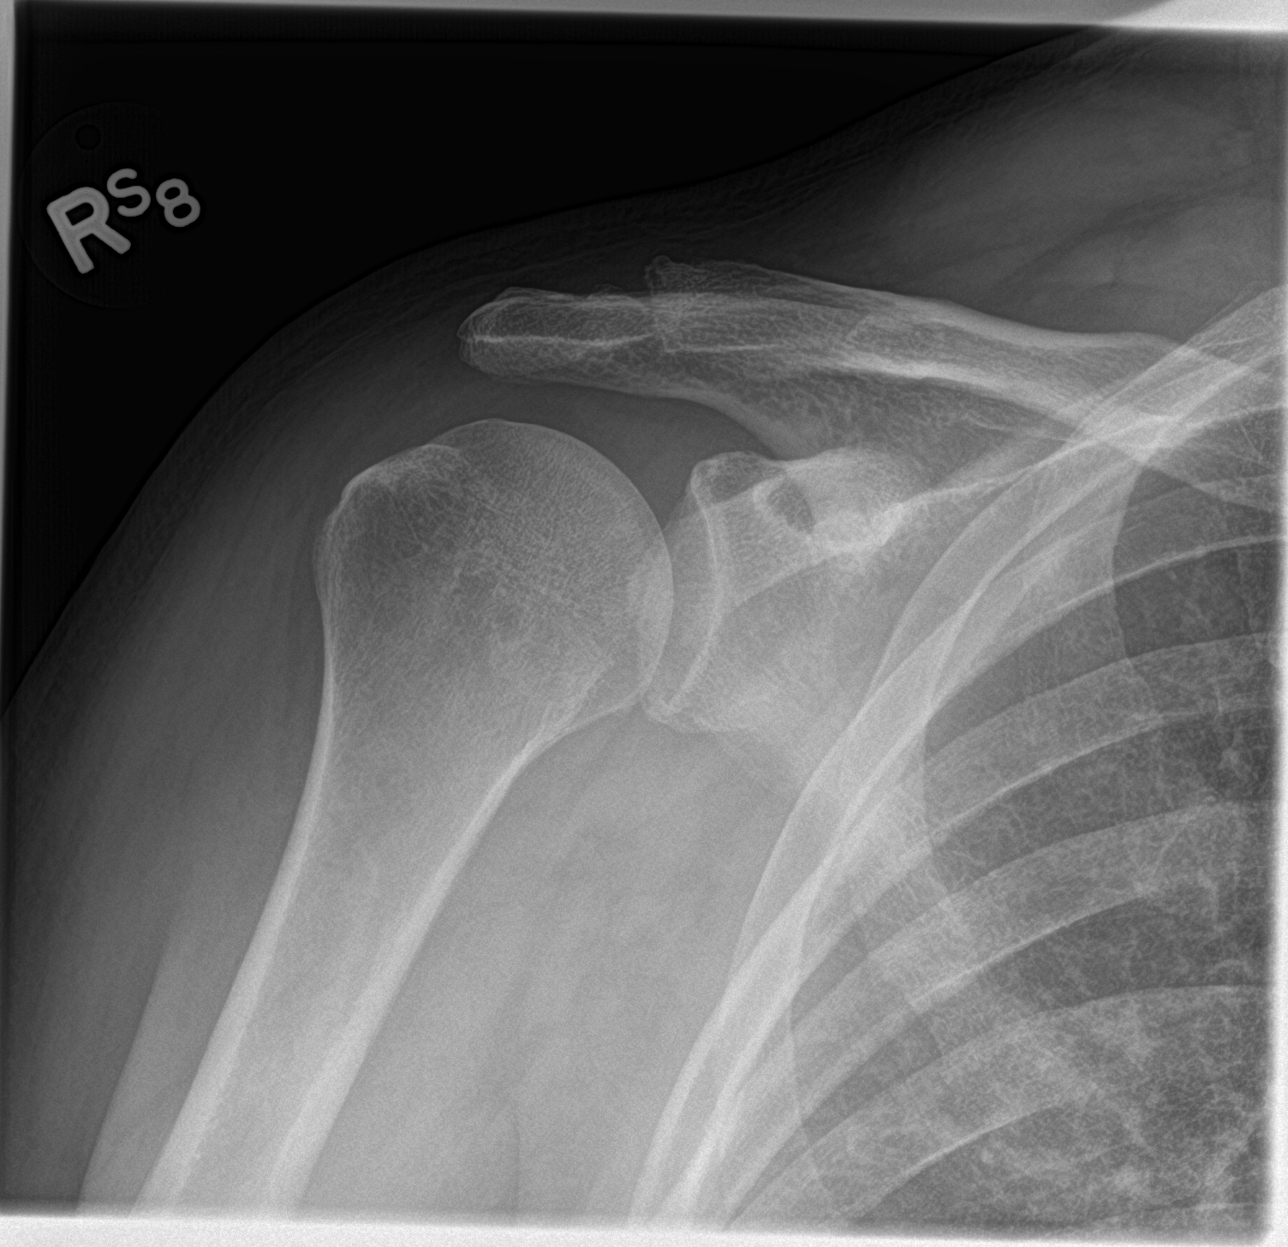

[shoulder y view]
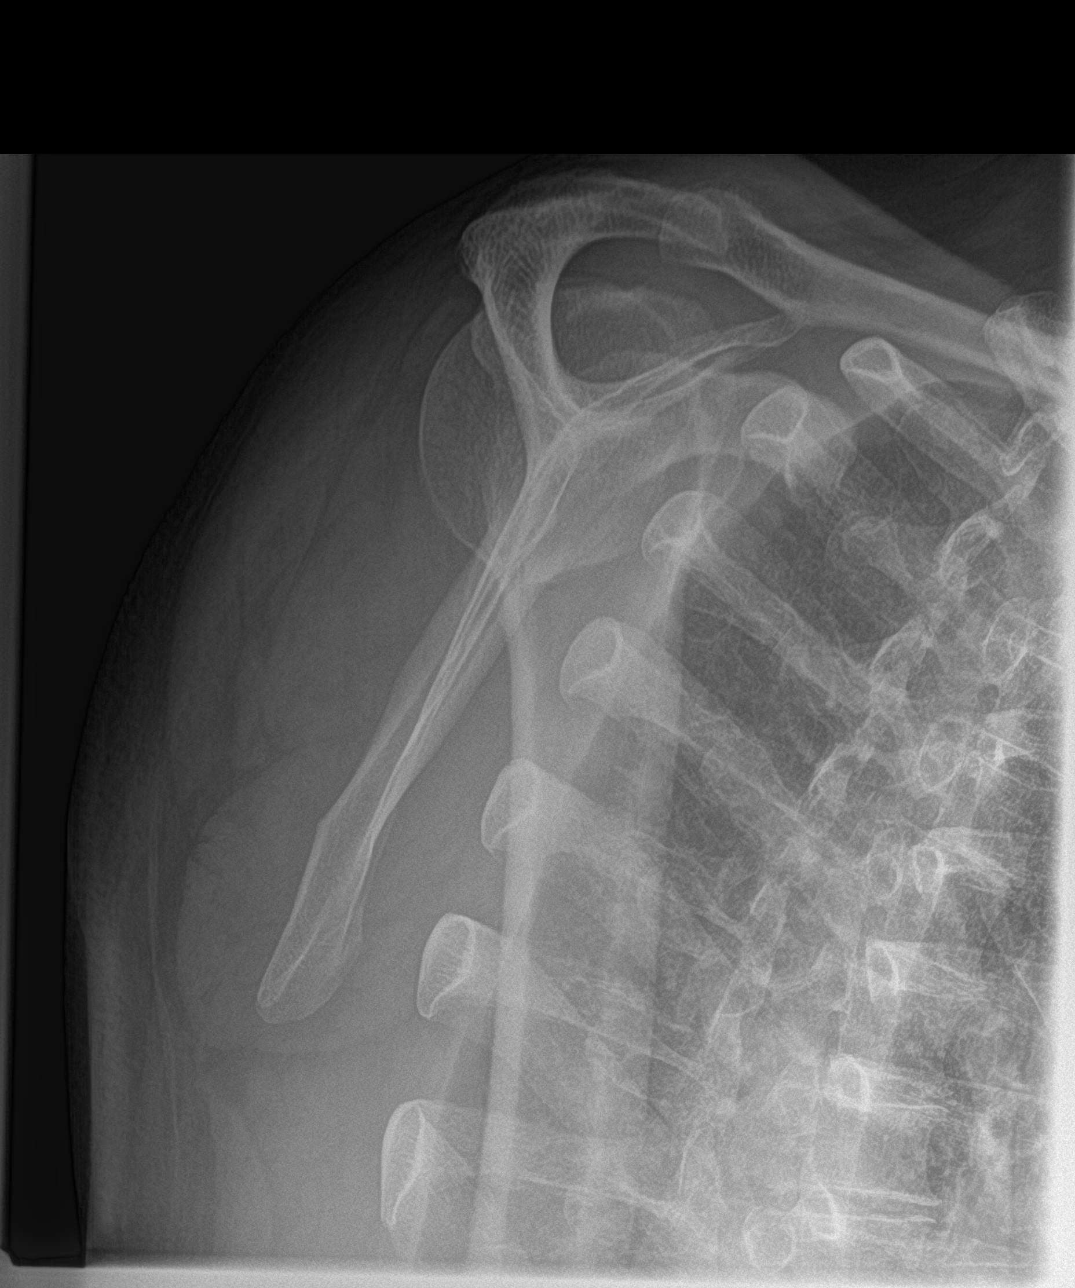

[shoulder axillary]
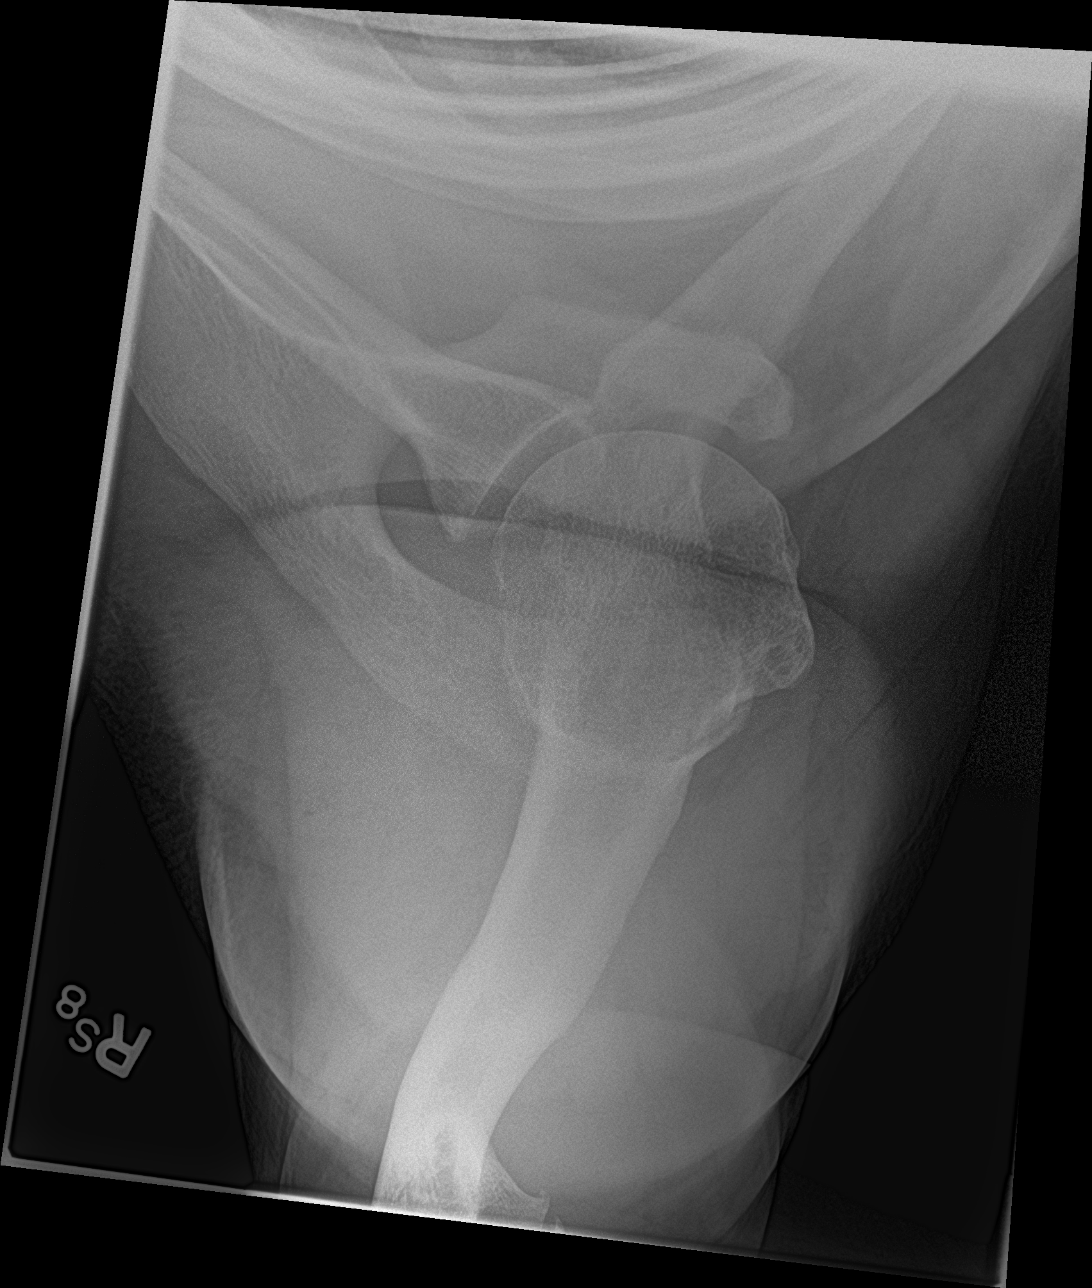

[3 of 3 positions shown; findings below may reference images not displayed]

FINDINGS: There is no evidence of fracture or dislocation. There is no
evidence of arthropathy or other focal bone abnormality. Soft
tissues are unremarkable.
IMPRESSION: Negative.

## 2015-08-03 MED ORDER — IBUPROFEN 200 MG PO TABS
600.0000 mg | ORAL_TABLET | Freq: Once | ORAL | Status: AC
  Administered 2015-08-03: 600 mg via ORAL
  Filled 2015-08-03 (×2): qty 3

## 2015-08-03 NOTE — ED Notes (Signed)
Pt reports that 5 days ago he began to have right arm pain, sometimes w/ numbness.  He states that it has kept him from sleeping.  Reports that pain is a 9 out of 10

## 2015-08-03 NOTE — ED Notes (Signed)
This RN noticed pt not in room - ED staff reports seeing pt walk out of facility.

## 2015-08-03 NOTE — ED Provider Notes (Signed)
CSN: [id number]     Arrival date & time 08/03/15  4098 History   First MD Initiated Contact with Patient 08/03/15 0710     Chief Complaint  Patient presents with  . Arm Pain   (Consider location/radiation/quality/duration/timing/severity/associated sxs/prior Treatment) HPI 50 y.o. male with a hx of HTN, presents to the Emergency Department today complaining of right shoulder pain for the past 8-10 days. Notes associated numbness that spans the length of his arm. No recent trauma. Notes that the shoulder is a sore feeling with sharp 10/10 pain intermittently. Worse with movement. Seen previously on 1/31 for left ankle pain w/ no acute fractures noted. No CP/SOB/ABD pain. No fevers. No headache. No facial droop/arm weakness. No other symptoms noted.    Past Medical History  Diagnosis Date  . Hypertension    Past Surgical History  Procedure Laterality Date  . Splenectomy    . Partial nephrectomy Left    No family history on file. Social History  Substance Use Topics  . Smoking status: Current Every Day Smoker    Types: Cigarettes  . Smokeless tobacco: None  . Alcohol Use: No    Review of Systems ROS reviewed and all are negative for acute change except as noted in the HPI.  Allergies  Review of patient's allergies indicates no known allergies.  Home Medications   Prior to Admission medications   Medication Sig Start Date End Date Taking? Authorizing Provider  cyclobenzaprine (FLEXERIL) 10 MG tablet Take 1 tablet (10 mg total) by mouth every 8 (eight) hours as needed for muscle spasms. 07/31/15  Yes Charmayne Sheer Beers, PA-C  oxyCODONE-acetaminophen (ROXICET) 5-325 MG tablet Take 1-2 tablets by mouth every 4 (four) hours as needed for severe pain. 07/31/15  Yes Charmayne Sheer Beers, PA-C  ranitidine (ZANTAC) 150 MG tablet Take 150 mg by mouth 2 (two) times daily as needed for heartburn.   Yes Historical Provider, MD  diclofenac (VOLTAREN) 75 MG EC tablet Take 1 tablet (75 mg total) by mouth  2 (two) times daily. Patient not taking: Reported on 08/03/2015 07/31/15   Charmayne Sheer Beers, PA-C   BP 112/81 mmHg  Pulse 92  Temp(Src) 97.8 F (36.6 C) (Oral)  Ht  (1.778 m)  Wt 111.131 kg  BMI 35.15 kg/m2  SpO2 97%   Physical Exam  Constitutional: He is oriented to person, place, and time. Vital signs are normal. He appears well-developed and well-nourished.  HENT:  Head: Normocephalic.  Right Ear: Hearing normal.  Left Ear: Hearing normal.  Eyes: EOM are normal.  Neck: Normal range of motion. Neck supple.  Cardiovascular: Normal rate, regular rhythm and normal heart sounds.   Pulmonary/Chest: Effort normal and breath sounds normal.  Abdominal: Soft.  Musculoskeletal:       Right shoulder: He exhibits decreased range of motion. He exhibits no tenderness, no swelling, no effusion and no pain.  Motor strength intact. Decrease ROM with elevation. Distal pulses intact. Cap refill <2sec  Neurological: He is alert and oriented to person, place, and time. He has normal strength. No cranial nerve deficit or sensory deficit.  Skin: Skin is warm and dry.  Psychiatric: He has a normal mood and affect. His speech is normal and behavior is normal. Thought content normal.    ED Course  Procedures (including critical care time) Labs Review Labs Reviewed - No data to display  Imaging Review Dg Ankle Complete Left  08/01/2015  CLINICAL DATA:  Left ankle pain today while driving. No known injury.  EXAM: LEFT ANKLE COMPLETE - 3+ VIEW COMPARISON:  Foot radiographs 09/21/2013. FINDINGS: The ankle mortise is maintained. No acute ankle fracture or osteochondral abnormality. No obvious joint effusion. The mid and hindfoot bony structures are intact. IMPRESSION: No acute bony findings or significant degenerative changes. Electronically Signed   By: Rudie Meyer M.D.   On: 08/01/2015 19:02   I have personally reviewed and evaluated these images and lab results as part of my medical  decision-making.   EKG Interpretation None      MDM  I have reviewed relevant imaging studies. I obtained HPI from historian.  ED Course: XR R Shoulder  Assessment: 35y M with pmh HTN presents with R shoulder pain with associated numbness down extremity. No recent trauma. 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 Pt has good motor strength and sensory function. CN evaluated with no concern for stroke like syndrome. Most likely rotator cuff abnormality or shoulder impingement. XR revealed no acute abnormalities. Will DC with follow up to PCP for referral to Orthopedics for further evaluation and treatment of symptoms.   8:19 AM- Pt left AMA before I could go through results of Xray of R shoulder. According to nursing staff, pt declined ibuprofen and stated that it would "not be able to touch my pain."   Disposition/Plan:  DC Home Additional Verbal discharge instructions given and discussed with patient.  Pt Instructed to f/u with PCP in the next 48 hours for evaluation and treatment of symptoms. Return precautions given Pt acknowledges and agrees with plan  Supervising Physician Loren Racer, MD   Final diagnoses:  Chronic shoulder pain, right       Audry Pili, PA-C 08/03/15 1610  Loren Racer, MD 08/03/15 [phone]

## 2015-08-06 ENCOUNTER — Encounter: Payer: Self-pay | Admitting: Emergency Medicine

## 2015-08-06 ENCOUNTER — Emergency Department
Admission: EM | Admit: 2015-08-06 | Discharge: 2015-08-06 | Disposition: A | Discharge status: Home / Self Care | Payer: Self-pay | Attending: Emergency Medicine | Admitting: Emergency Medicine

## 2015-08-06 DIAGNOSIS — I10 Essential (primary) hypertension: Secondary | ICD-10-CM | POA: Insufficient documentation

## 2015-08-06 DIAGNOSIS — M1380 Other specified arthritis, unspecified site: Secondary | ICD-10-CM | POA: Insufficient documentation

## 2015-08-06 DIAGNOSIS — Z791 Long term (current) use of non-steroidal anti-inflammatories (NSAID): Secondary | ICD-10-CM | POA: Insufficient documentation

## 2015-08-06 DIAGNOSIS — F17210 Nicotine dependence, cigarettes, uncomplicated: Secondary | ICD-10-CM | POA: Insufficient documentation

## 2015-08-06 DIAGNOSIS — M1990 Unspecified osteoarthritis, unspecified site: Secondary | ICD-10-CM

## 2015-08-06 DIAGNOSIS — R40241 Glasgow coma scale score 13-15, unspecified time: Secondary | ICD-10-CM | POA: Insufficient documentation

## 2015-08-06 LAB — BASIC METABOLIC PANEL
Anion gap: 10 (ref 5–15)
BUN: 17 mg/dL (ref 6–20)
CALCIUM: 9 mg/dL (ref 8.9–10.3)
CO2: 26 mmol/L (ref 22–32)
Chloride: 105 mmol/L (ref 101–111)
Creatinine, Ser: 0.6 mg/dL — ABNORMAL LOW (ref 0.61–1.24)
GFR calc Af Amer: >60 mL/min (ref >60)
GLUCOSE: 129 mg/dL — AB (ref 65–99)
POTASSIUM: 4 mmol/L (ref 3.5–5.1)
Sodium: 141 mmol/L (ref 135–145)

## 2015-08-06 LAB — CBC WITH DIFFERENTIAL/PLATELET
BASOS PCT: 0 %
Basophils Absolute: 0 10*3/uL (ref 0–0.1)
EOS PCT: 1 %
Eosinophils Absolute: 0.2 10*3/uL (ref 0–0.7)
HCT: 38.5 % — ABNORMAL LOW (ref 40.0–52.0)
Hemoglobin: 12.6 g/dL — ABNORMAL LOW (ref 13.0–18.0)
LYMPHS ABS: 2.1 10*3/uL (ref 1.0–3.6)
Lymphocytes Relative: 10 %
MCH: 29.6 pg (ref 26.0–34.0)
MCHC: 32.8 g/dL (ref 32.0–36.0)
MCV: 90.4 fL (ref 80.0–100.0)
MONO ABS: 2.9 10*3/uL — AB (ref 0.2–1.0)
Monocytes Relative: 14 %
NEUTROS ABS: 15.3 10*3/uL — AB (ref 1.4–6.5)
Neutrophils Relative %: 75 %
PLATELETS: 436 10*3/uL (ref 150–440)
RBC: 4.26 MIL/uL — ABNORMAL LOW (ref 4.40–5.90)
RDW: 14.2 % (ref 11.5–14.5)
WBC: 20.5 10*3/uL — ABNORMAL HIGH (ref 3.8–10.6)

## 2015-08-06 LAB — SEDIMENTATION RATE: Sed Rate: 61 mm/hr — ABNORMAL HIGH (ref 0–20)

## 2015-08-06 MED ORDER — PREDNISONE 20 MG PO TABS: 40.0000 mg | ORAL_TABLET | Freq: Every day | ORAL | Status: DC

## 2015-08-06 MED ORDER — KETOROLAC TROMETHAMINE 30 MG/ML IJ SOLN
30.0000 mg | Freq: Once | INTRAMUSCULAR | Status: AC
  Administered 2015-08-06: 30 mg via INTRAVENOUS
  Filled 2015-08-06: qty 1

## 2015-08-06 MED ORDER — METHYLPREDNISOLONE SODIUM SUCC 125 MG IJ SOLR
125.0000 mg | Freq: Once | INTRAMUSCULAR | Status: AC
  Administered 2015-08-06: 125 mg via INTRAVENOUS
  Filled 2015-08-06: qty 2

## 2015-08-06 MED ORDER — TRAMADOL HCL 50 MG PO TABS: 50.0000 mg | ORAL_TABLET | Freq: Four times a day (QID) | ORAL | Status: DC | PRN

## 2015-08-06 NOTE — ED Provider Notes (Signed)
Time Seen: Approximately 0 320  I have reviewed the triage notes  Chief Complaint: Joint Pain   History of Present Illness: Brian Porter is a 50 y.o. male who states he's had pain now for approximately a week. Patient's been to different emergency departments for evaluation of his pain. Patient's had x-rays of various joints without any obvious abnormalities. He denies any fever or direct trauma. He states his pain seems to be worse after work and he is primarily complaining of right shoulder and right wrist, left wrist, right knee discomfort. Some swelling especially in both of his wrists and hands. 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 He does smoke a pack of cigarettes per day. He denies any redness of any of the joints.   Past Medical History  Diagnosis Date  . Hypertension     Patient Active Problem List   Diagnosis Date Noted  . Chest pain 02/22/2015  . Chest pain at rest 02/22/2015    Past Surgical History  Procedure Laterality Date  . Splenectomy    . Partial nephrectomy Left     Past Surgical History  Procedure Laterality Date  . Splenectomy    . Partial nephrectomy Left     Current Outpatient Rx  Name  Route  Sig  Dispense  Refill  . cyclobenzaprine (FLEXERIL) 10 MG tablet   Oral   Take 1 tablet (10 mg total) by mouth every 8 (eight) hours as needed for muscle spasms.   30 tablet   1   . oxyCODONE-acetaminophen (ROXICET) 5-325 MG tablet   Oral   Take 1-2 tablets by mouth every 4 (four) hours as needed for severe pain.   15 tablet   0   . diclofenac (VOLTAREN) 75 MG EC tablet   Oral   Take 1 tablet (75 mg total) by mouth 2 (two) times daily. Patient not taking: Reported on 08/03/2015   60 tablet   0   . predniSONE (DELTASONE) 20 MG tablet   Oral   Take 2 tablets (40 mg total) by mouth daily.   14 tablet   0   . ranitidine (ZANTAC) 150 MG tablet   Oral   Take 150 mg by mouth 2 (two) times daily as needed for  heartburn.         . traMADol (ULTRAM) 50 MG tablet   Oral   Take 1 tablet (50 mg total) by mouth every 6 (six) hours as needed.   20 tablet   0     Allergies:  Review of patient's allergies indicates no known allergies.  Family History: History reviewed. No pertinent family history.  Social History: Social History  Substance Use Topics  . Smoking status: Current Every Day Smoker -- 1.00 packs/day    Types: Cigarettes  . Smokeless tobacco: None  . Alcohol Use: No     Review of Systems:   10 point review of systems was performed and was otherwise negative:  Constitutional: No fever Eyes: No visual disturbances ENT: No sore throat, ear pain Cardiac: No chest pain Respiratory: No shortness of breath, wheezing, or stridor Abdomen: No abdominal pain, no vomiting, No diarrhea Endocrine: No weight loss, No night sweats Extremities: No peripheral edema, cyanosis Skin: No rashes, easy bruising Neurologic: No focal weakness, trouble with speech or swollowing Urologic: No dysuria, Hematuria, or urinary frequency   Physical Exam:  ED Triage Vitals  Enc Vitals Group     BP 08/06/15 0311 144/93 mmHg  Pulse Rate 08/06/15 0311 112     Resp 08/06/15 0311 22     Temp 08/06/15 0311 97.9 F (36.6 C)     Temp Source 08/06/15 0311 Oral     SpO2 08/06/15 0311 97 %     Weight 08/06/15 0311 245 lb (111.131 kg)     Height 08/06/15 0311  (1.753 m)     Head Cir --      Peak Flow --      Pain Score 08/06/15 0312 10     Pain Loc --      Pain Edu? --      Excl. in GC? --     General: Awake , Alert , and Oriented times 3; somewhat drowsy post work with a Glasgow Coma Scale 15 Head: Normal cephalic , atraumatic Eyes: Pupils equal , round, reactive to light Nose/Throat: No nasal drainage, patent upper airway without erythema or exudate.  Neck: Supple, Full range of motion, No anterior adenopathy or palpable thyroid masses Lungs: Clear to ascultation without wheezes ,  rhonchi, or rales Heart: Regular rate, regular rhythm without murmurs , gallops , or rubs Abdomen: Soft, non tender without rebound, guarding , or rigidity; bowel sounds positive and symmetric in all 4 quadrants. No organomegaly .        Extremities: He has diffuse tenderness primarily over the posterior surface of both wrists and also has some swelling especially in his both posterior surfaces of the hands. He has reproducible right shoulder and knee discomfort without any erythema or warmth noted. Neurologic: normal ambulation, Motor symmetric without deficits, sensory intact Skin: warm, dry, no rashes   Labs:   All laboratory work was reviewed including any pertinent negatives or positives listed below:  Labs Reviewed  CBC WITH DIFFERENTIAL/PLATELET - Abnormal; Notable for the following:    WBC 20.5 (*)    RBC 4.26 (*)    Hemoglobin 12.6 (*)    HCT 38.5 (*)    Neutro Abs 15.3 (*)    Monocytes Absolute 2.9 (*)    All other components within normal limits  BASIC METABOLIC PANEL - Abnormal; Notable for the following:    Glucose, Bld 129 (*)    Creatinine, Ser 0.60 (*)    All other components within normal limits  SEDIMENTATION RATE - Abnormal; Notable for the following:    Sed Rate 61 (*)    All other components within normal limits   the patient's laboratory work in summary shows an inflammatory arthritic pattern  ED Course:  Patient received IV Solu-Medrol here in emergency Department be discharged on prednisone. His pain seems to be less after the steroids with noted decreased swelling especially in the left hand region. He has multiple joints affected at this time and on both sides of the body and giving a rheumatoid arthritic type pattern. He denies any recent sexual exposure to gonococcal arthritis and with no fever or felt this was unlikely. He may have a bladder syndrome versus rheumatoid versus lupus etc. Patient has been advised his workup is not complete and he needs to  follow up with her primary physician. He was advised take ibuprofen over-the-counter for pain and was prescribed Ultram. He is given a work note for the next couple of days in order to get the medication filled and started. 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 Patient was advised to follow up with their primary care physician or other specialized physicians involved in their outpatient care             Jennye Moccasin, MD 08/06/15 [phone]

## 2015-08-06 NOTE — ED Notes (Addendum)
Pt c/o joint pain all over for about a week; worse now in right foot and ankle, right hand and wrist; right shoulder; right hand noted to be swollen in triage; pt has been here twice and [facility] once in the last week for the same; says he can't get anyone to do any blood work; "something is wrong"; pt prescribed 3 medications on 07/31/15 which he says he filled but are providing no relief

## 2015-08-06 NOTE — Discharge Instructions (Signed)
Arthritis Arthritis is a term that is commonly used to refer to joint pain or joint disease. There are more than 100 types of arthritis. CAUSES The most common cause of this condition is wear and tear of a joint. Other causes include:  Gout.  Inflammation of a joint.  An infection of a joint.  Sprains and other injuries near the joint.  A drug reaction or allergic reaction. In some cases, the cause may not be known. SYMPTOMS The main symptom of this condition is pain in the joint with movement. Other symptoms include:  Redness, swelling, or stiffness at a joint.  Warmth coming from the joint.  Fever.  Overall feeling of illness. DIAGNOSIS This condition may be diagnosed with a physical exam and tests, including:  Blood tests.  Urine tests.  Imaging tests, such as MRI, X-rays, or a CT scan. Sometimes, fluid is removed from a joint for testing. TREATMENT Treatment for this condition may involve:  Treatment of the cause, if it is known.  Rest.  Raising (elevating) the joint.  Applying cold or hot packs to the joint.  Medicines to improve symptoms and reduce inflammation.  Injections of a steroid such as cortisone into the joint to help reduce pain and inflammation. Depending on the cause of your arthritis, you may need to make lifestyle changes to reduce stress on your joint. These changes may include exercising more and losing weight. HOME CARE INSTRUCTIONS Medicines  Take over-the-counter and prescription medicines only as told by your health care provider.  Do not take aspirin to relieve pain if gout is suspected. Activities  Rest your joint if told by your health care provider. Rest is important when your disease is active and your joint feels painful, swollen, or stiff.  Avoid activities that make the pain worse. It is important to balance activity with rest.  Exercise your joint regularly with range-of-motion exercises as told by your health care  provider. Try doing low-impact exercise, such as:  Swimming.  Water aerobics.  Biking.  Walking. Joint Care  If your joint is swollen, keep it elevated if told by your health care provider.  If your joint feels stiff in the morning, try taking a warm shower.  If directed, apply heat to the joint. If you have diabetes, do not apply heat without permission from your health care provider.  Put a towel between the joint and the hot pack or heating pad.  Leave the heat on the area for 20-30 minutes.  If directed, apply ice to the joint:  Put ice in a plastic bag.  Place a towel between your skin and the bag.  Leave the ice on for 20 minutes, 2-3 times per day.  Keep all follow-up visits as told by your health care provider. This is important. SEEK MEDICAL CARE IF:  The pain gets worse.  You have a fever. SEEK IMMEDIATE MEDICAL CARE IF:  You develop severe joint pain, swelling, or redness.  Many joints become painful and swollen.  You develop severe back pain.  You develop severe weakness in your leg.  You cannot control your bladder or bowels.   This information is not intended to replace advice given to you by your health care provider. Make sure you discuss any questions you have with your health care provider.   Document Released: 07/25/2004 Document Revised: 03/08/2015 Document Reviewed: 09/12/2014 Elsevier Interactive Patient Education [employer]  Please return immediately if condition worsens. Please contact her primary physician or the physician  you were given for referral. If you have any specialist physicians involved in her treatment and plan please also contact them. Thank you for using [facility]. 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 This will require further follow-up with a general medicine physician and/or her rheumatologist. Further  laboratory work and testing is likely necessary.

## 2015-08-06 NOTE — ED Notes (Signed)
Pt discharged to home.  Family member driving.  Discharge instructions reviewed.  Verbalized understanding.  No questions or concerns at this time.  Teach back verified.  Pt in NAD.  No items left in ED.   

## 2015-09-05 ENCOUNTER — Emergency Department
Admission: EM | Admit: 2015-09-05 | Discharge: 2015-09-05 | Disposition: A | Discharge status: Home / Self Care | Payer: Self-pay | Attending: Emergency Medicine | Admitting: Emergency Medicine

## 2015-09-05 ENCOUNTER — Encounter: Payer: Self-pay | Admitting: Emergency Medicine

## 2015-09-05 ENCOUNTER — Emergency Department: Payer: Self-pay

## 2015-09-05 DIAGNOSIS — J111 Influenza due to unidentified influenza virus with other respiratory manifestations: Secondary | ICD-10-CM | POA: Insufficient documentation

## 2015-09-05 DIAGNOSIS — J209 Acute bronchitis, unspecified: Secondary | ICD-10-CM | POA: Insufficient documentation

## 2015-09-05 DIAGNOSIS — J189 Pneumonia, unspecified organism: Secondary | ICD-10-CM | POA: Insufficient documentation

## 2015-09-05 DIAGNOSIS — Z79899 Other long term (current) drug therapy: Secondary | ICD-10-CM | POA: Insufficient documentation

## 2015-09-05 DIAGNOSIS — F17210 Nicotine dependence, cigarettes, uncomplicated: Secondary | ICD-10-CM | POA: Insufficient documentation

## 2015-09-05 DIAGNOSIS — I10 Essential (primary) hypertension: Secondary | ICD-10-CM | POA: Insufficient documentation

## 2015-09-05 DIAGNOSIS — R69 Illness, unspecified: Secondary | ICD-10-CM

## 2015-09-05 IMAGING — CR DG CHEST 2V
1 series · 2 of 2 positions shown · non-contrast
Comparison: [DATE]

CLINICAL DATA: Body aches and chills for 2 days

EXAM:
CHEST  2 VIEW

[Series 1: dg chest 2 view · 0.14mm/px · 2 of 2 slices shown]
[im 1/2]
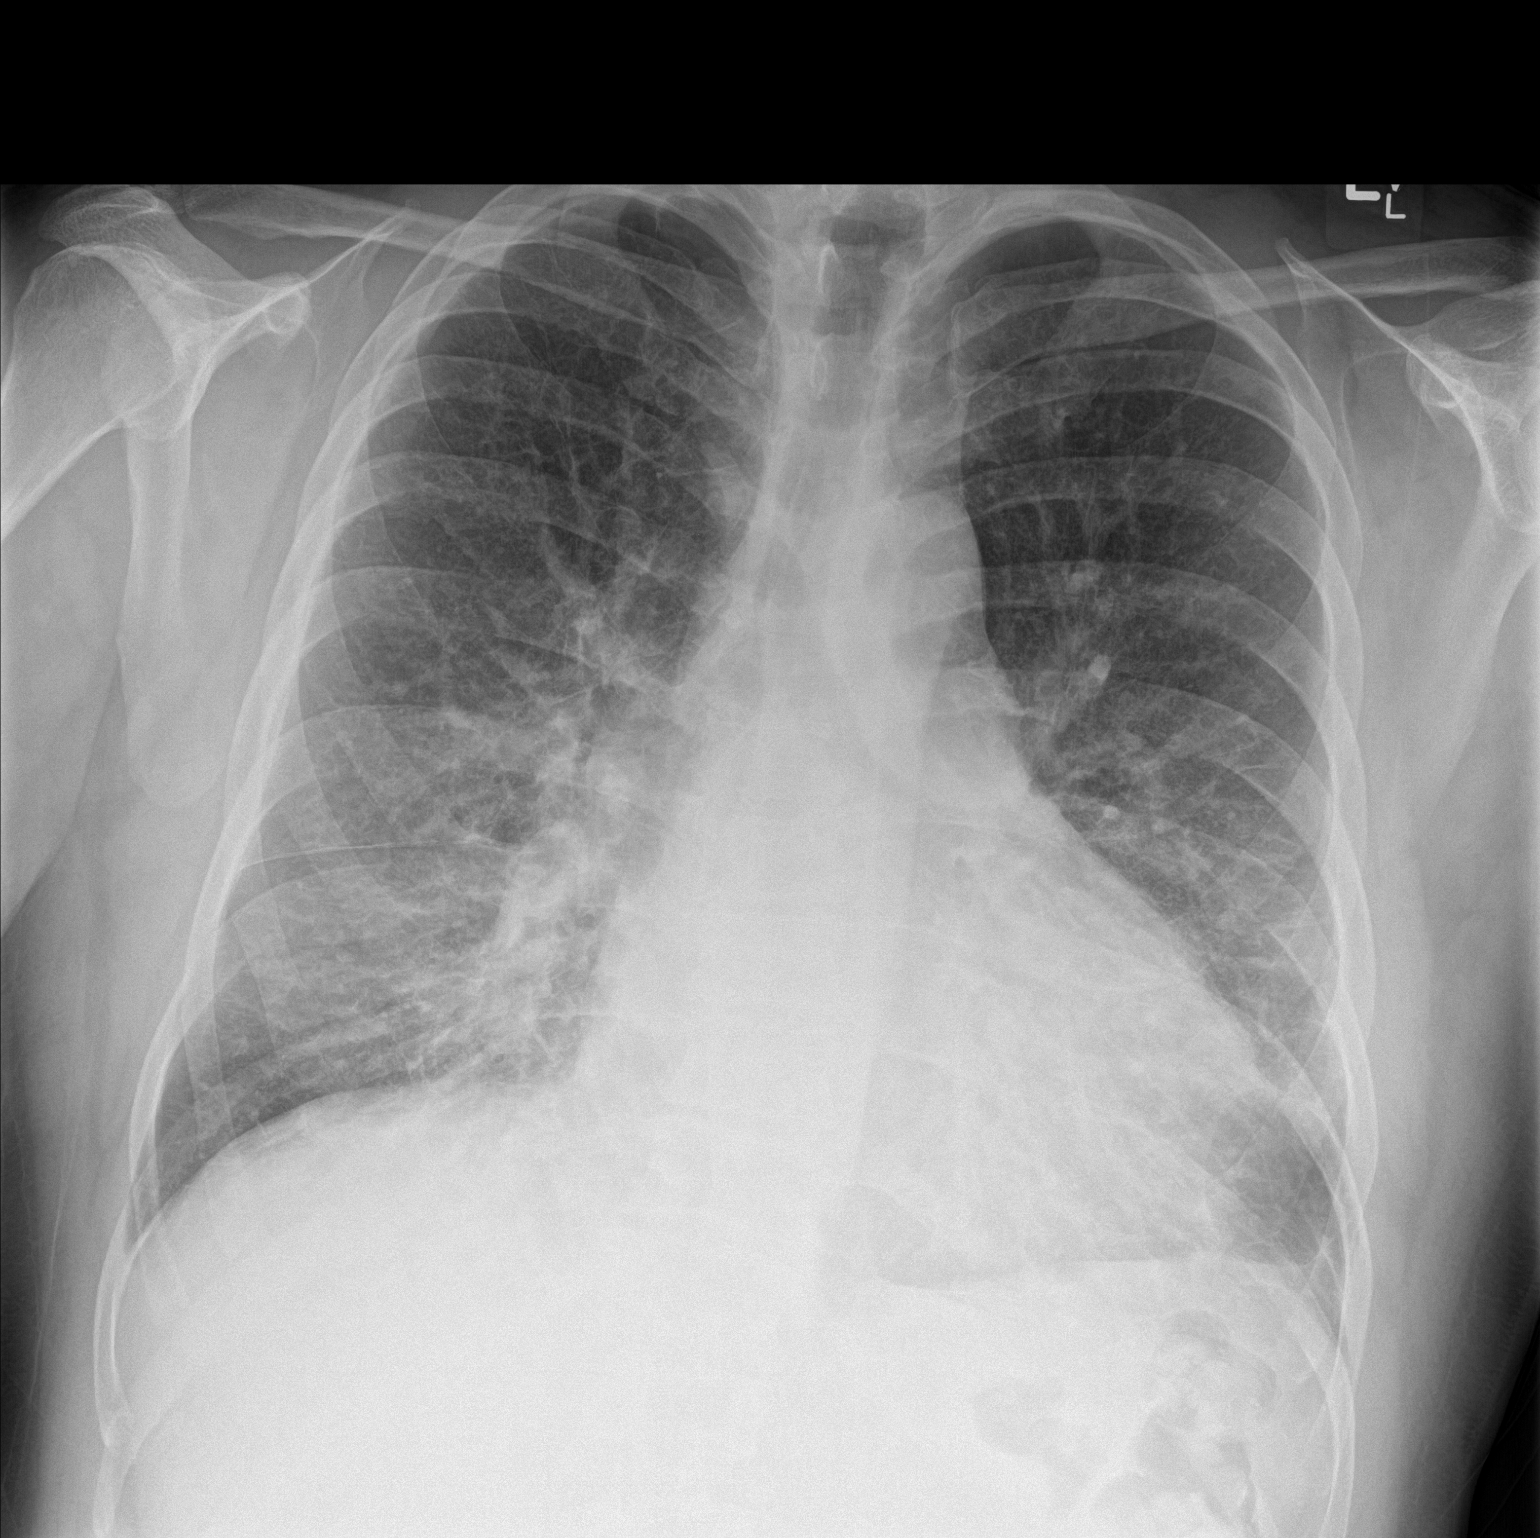
[im 2/2]
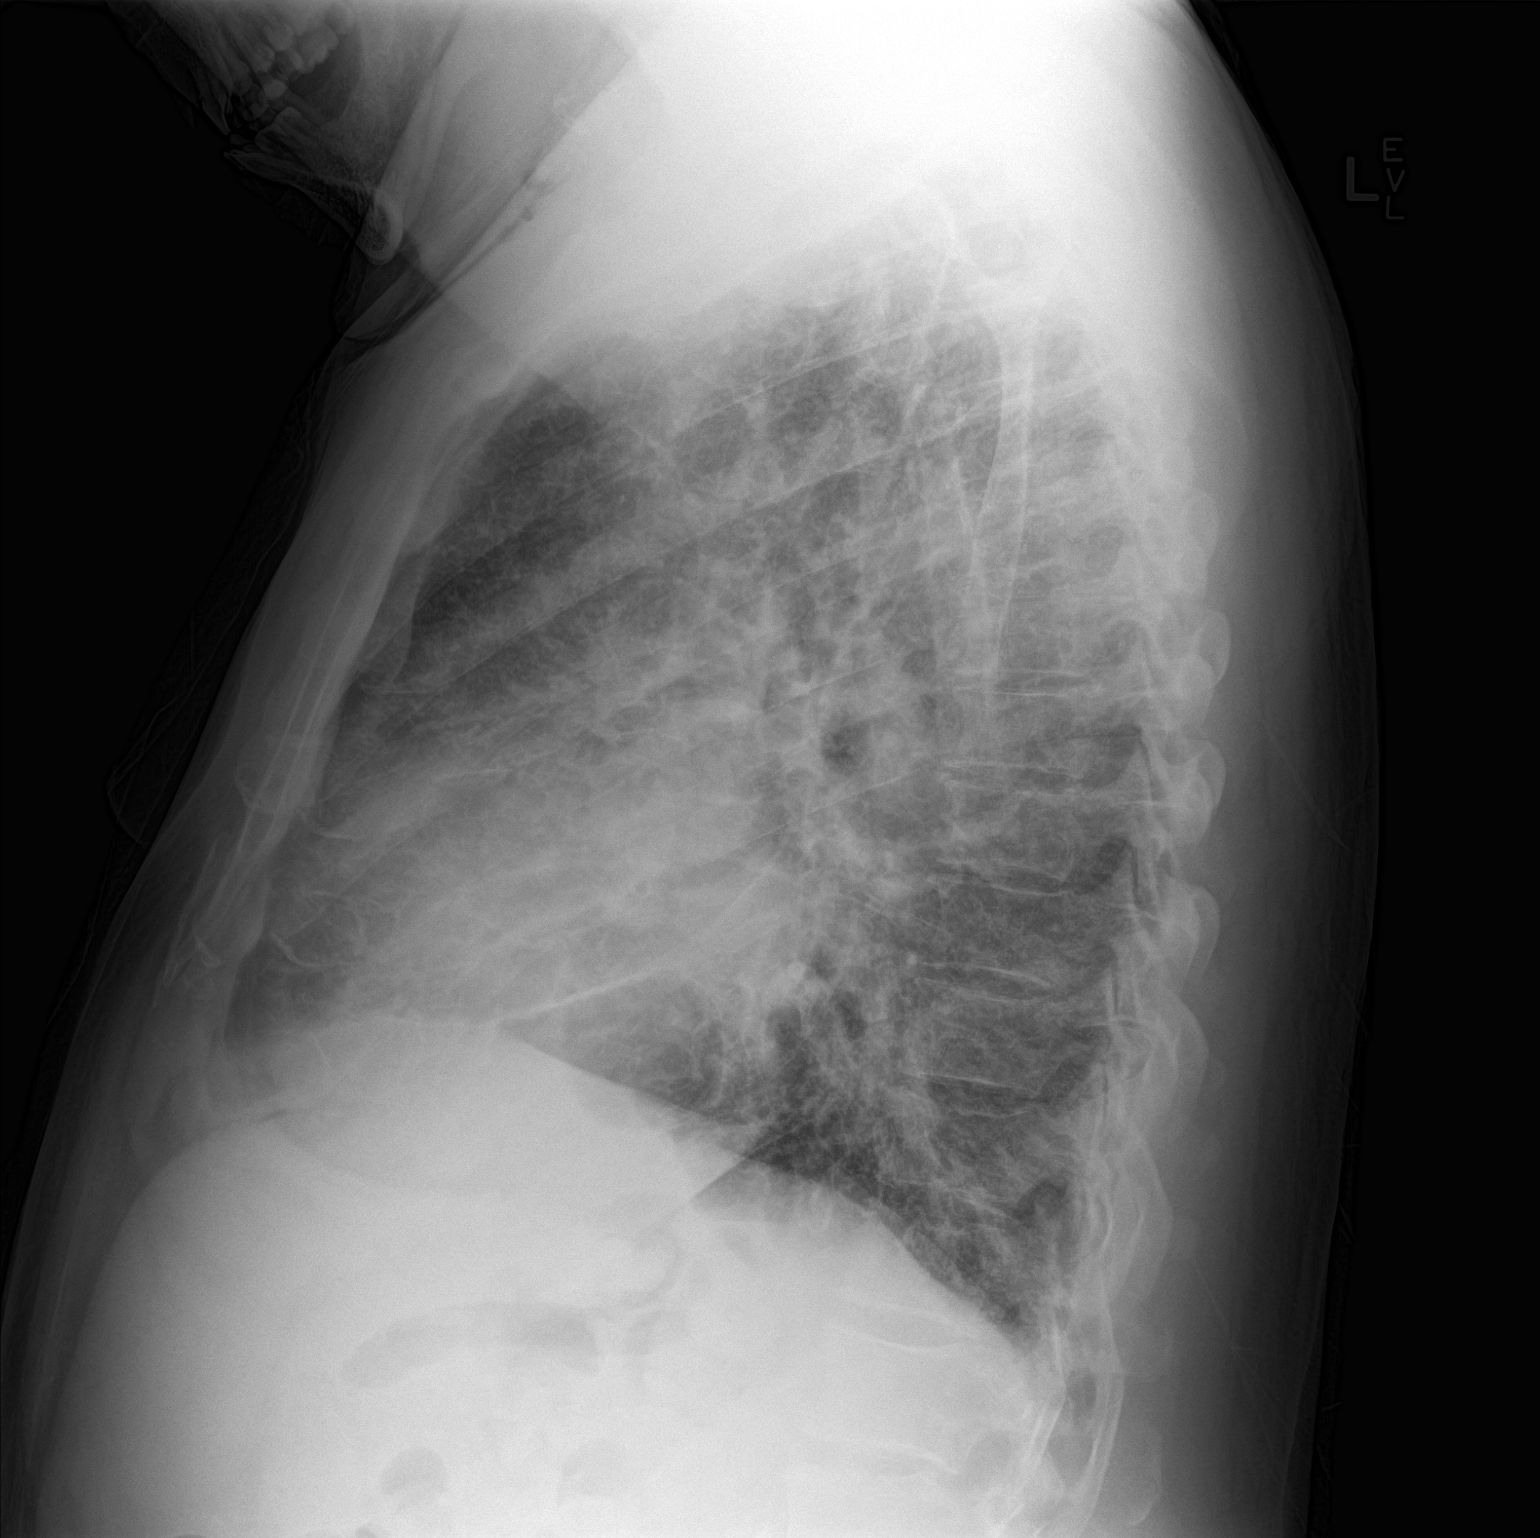

[2 of 2 positions shown; findings below may reference images not displayed]

FINDINGS: Moderate cardiomegaly. There are heterogeneous opacities in the
bilateral central and basilar lung zones characterized by
combination of ground-glass and linear densities. These findings are
stable compared to the prior study. They may be recurrent after
resolution. Upper lungs clear. No pneumothorax.
IMPRESSION: Bilateral central and basilar pulmonary opacities as described.
Findings may represent pulmonary edema, however atypical pneumonia
is not excluded. Followup PA and lateral chest X-ray is recommended
in 3-4 weeks following trial of antibiotic therapy to ensure
resolution and exclude underlying malignancy.

## 2015-09-05 MED ORDER — PREDNISONE 20 MG PO TABS: 40.0000 mg | ORAL_TABLET | ORAL | Status: DC

## 2015-09-05 MED ORDER — PREDNISONE 20 MG PO TABS: 40.0000 mg | ORAL_TABLET | Freq: Every day | ORAL | Status: DC

## 2015-09-05 MED ORDER — ALBUTEROL SULFATE HFA 108 (90 BASE) MCG/ACT IN AERS: 2.0000 | INHALATION_SPRAY | RESPIRATORY_TRACT | Status: DC | PRN

## 2015-09-05 MED ORDER — AZITHROMYCIN 250 MG PO TABS: ORAL_TABLET | ORAL | Status: DC

## 2015-09-05 MED ORDER — NAPROXEN 500 MG PO TABS: 500.0000 mg | ORAL_TABLET | Freq: Two times a day (BID) | ORAL | Status: DC

## 2015-09-05 NOTE — ED Notes (Signed)
Patient complaining of body aches and chills beginning on Sunday.  States he could not sleep last night because he could not get comfortable.  Tried Aleve OTC with some success.  Denies known fever or exposure to anyone with similar symptoms.  Coughing sometimes productive.  States he feels short of breath - "that's the worse" and no energy.

## 2015-09-05 NOTE — ED Notes (Addendum)
See triage note   States he developed some body aches with chills on Sunday.. Was not able to sleep last pm b/c of body aches. Low grade fever at present pt is very lethargic at present states d/t no sleep last pm. Also noticed some swelling to both hands and lower ext .Marland Kitchen. Denies any chest pain

## 2015-09-05 NOTE — ED Provider Notes (Signed)
[facility] Emergency Department Provider Note  ____________________________________________  Time seen: 10:30 AM  I have reviewed the triage vital signs and the nursing notes.   HISTORY  Chief Complaint Generalized Body Aches    HPI Brian Porter is a 50 y.o. male who complains of body aches chills and difficulty sleeping the past 2 days. It hurts all over his body and also complains some pain in his joints. He has a cough which is occasionally productive. He does not feel short of breath, no chest pain. No abdominal pain nausea vomiting or diarrhea.  States the tramadol was not effective for his chronic pain and requests opioid pain medication.   Past Medical History  Diagnosis Date  . Hypertension      Patient Active Problem List   Diagnosis Date Noted  . Chest pain 02/22/2015  . Chest pain at rest 02/22/2015     Past Surgical History  Procedure Laterality Date  . Splenectomy    . Partial nephrectomy Left      Current Outpatient Rx  Name  Route  Sig  Dispense  Refill  . albuterol (PROVENTIL HFA) 108 (90 Base) MCG/ACT inhaler   Inhalation   Inhale 2 puffs into the lungs every 4 (four) hours as needed for wheezing or shortness of breath.   1 Inhaler   0   . azithromycin (ZITHROMAX Z-PAK) 250 MG tablet      Take 2 tablets (500 mg) on  Day 1,  followed by 1 tablet (250 mg) once daily on Days 2 through 5.   6 each   0   . cyclobenzaprine (FLEXERIL) 10 MG tablet   Oral   Take 1 tablet (10 mg total) by mouth every 8 (eight) hours as needed for muscle spasms.   30 tablet   1   . diclofenac (VOLTAREN) 75 MG EC tablet   Oral   Take 1 tablet (75 mg total) by mouth 2 (two) times daily. Patient not taking: Reported on 08/03/2015   60 tablet   0   . naproxen (NAPROSYN) 500 MG tablet   Oral   Take 1 tablet (500 mg total) by mouth 2 (two) times daily with a meal.   20 tablet   0   . oxyCODONE-acetaminophen (ROXICET) 5-325 MG tablet    Oral   Take 1-2 tablets by mouth every 4 (four) hours as needed for severe pain.   15 tablet   0   . predniSONE (DELTASONE) 20 MG tablet   Oral   Take 2 tablets (40 mg total) by mouth daily.   8 tablet   0   . ranitidine (ZANTAC) 150 MG tablet   Oral   Take 150 mg by mouth 2 (two) times daily as needed for heartburn.         . traMADol (ULTRAM) 50 MG tablet   Oral   Take 1 tablet (50 mg total) by mouth every 6 (six) hours as needed.   20 tablet   0      Allergies Review of patient's allergies indicates no known allergies.   No family history on file.  Social History Social History  Substance Use Topics  . Smoking status: Current Every Day Smoker -- 1.00 packs/day    Types: Cigarettes  . Smokeless tobacco: None  . Alcohol Use: No    Review of Systems  Constitutional:  Subjective fever and chills. No weight changes Eyes:   No blurry vision or double vision.  ENT:  Positive sore throat.  Cardiovascular:   No chest pain. Respiratory:   No dyspnea positive cough. Gastrointestinal:   Negative for abdominal pain, vomiting and diarrhea.  No BRBPR or melena. Genitourinary:   Negative for dysuria or difficulty urinating. Musculoskeletal:   Positive diffuse myalgia and joint pains Skin:   Negative for rash. Neurological:   Negative for headaches, focal weakness or numbness. Psychiatric:  No anxiety or depression.   Endocrine:  No changes in energy or sleep difficulty.  10-point ROS otherwise negative.  ____________________________________________   PHYSICAL EXAM:  VITAL SIGNS: ED Triage Vitals  Enc Vitals Group     BP 09/05/15 0932 130/82 mmHg     Pulse Rate 09/05/15 0932 107     Resp 09/05/15 0932 20     Temp 09/05/15 0932 99.4 F (37.4 C)     Temp Source 09/05/15 0932 Oral     SpO2 09/05/15 0932 92 %     Weight 09/05/15 0932 245 lb (111.131 kg)     Height 09/05/15 0932  (1.778 m)     Head Cir --      Peak Flow --      Pain Score 09/05/15  0933 8     Pain Loc --      Pain Edu? --      Excl. in GC? --     Vital signs reviewed, nursing assessments reviewed. Patient sleeping when I first walked into the room. Easily arousable.  Constitutional:   Alert and oriented. Well appearing and in no distress. Eyes:   No scleral icterus. No conjunctival pallor. PERRL. EOMI ENT   Head:   Normocephalic and atraumatic.   Nose:   No congestion/rhinnorhea. No septal hematoma   Mouth/Throat:   MMM, mild pharyngeal erythema. No peritonsillar mass.    Neck:   No stridor. No SubQ emphysema. No meningismus. Hematological/Lymphatic/Immunilogical:   No cervical lymphadenopathy. Cardiovascular:   RRR. Symmetric bilateral radial and DP pulses.  No murmurs.  Respiratory:   Normal respiratory effort without tachypnea nor retractions. Breath sounds are clear and equal bilaterally with inspiration. Mild expiratory wheezes diffusely, normal expiratory phase.. Gastrointestinal:   Soft and nontender. Non distended. There is no CVA tenderness.  No rebound, rigidity, or guarding. Genitourinary:   deferred Musculoskeletal:   Nontender with normal range of motion in all extremities. No joint effusions.  No lower extremity tenderness.  No edema. Neurologic:   Normal speech and language.  CN 2-10 normal. Motor grossly intact. No gross focal neurologic deficits are appreciated.  Skin:    Skin is warm, dry and intact. No rash noted.  No petechiae, purpura, or bullae. Psychiatric:   Mood and affect are normal. ____________________________________________    LABS (pertinent positives/negatives) (all labs ordered are listed, but only abnormal results are displayed) Labs Reviewed  RAPID INFLUENZA A&B ANTIGENS ([facility] ONLY)   ____________________________________________   EKG    ____________________________________________    RADIOLOGY  Chest x-ray shows bilateral opacities consistent with atypical  pneumonia  ____________________________________________   PROCEDURES   ____________________________________________   INITIAL IMPRESSION / ASSESSMENT AND PLAN / ED COURSE  Pertinent labs & imaging results that were available during my care of the patient were reviewed by me and considered in my medical decision making (see chart for details).  Patient presents with constellation of symptoms suggestive of influenza-like illness. He also has a slightly elevated on a temperature. Low suspicion for sepsis. He appears to have acute bronchitis, chest x-ray suggestive of pneumonia so we'll cover  with azithromycin. Also give him prednisone and albuterol. Her chronic pain until he follows up with primary care. Otherwise well-appearing no acute distress. Low suspicion for ACS PE TAD pneumothorax carditis mediastinitis. No evidence of any soft tissue infection.     ____________________________________________   FINAL CLINICAL IMPRESSION(S) / ED DIAGNOSES  Final diagnoses:  Acute bronchitis, unspecified organism  Atypical pneumonia  Influenza-like illness      Sharman Cheek, MD 09/05/15 1108

## 2015-09-05 NOTE — Discharge Instructions (Signed)
Acute Bronchitis °Bronchitis is inflammation of the airways that extend from the windpipe into the lungs (bronchi). The inflammation often causes mucus to develop. This leads to a cough, which is the most common symptom of bronchitis.  °In acute bronchitis, the condition usually develops suddenly and goes away over time, usually in a couple weeks. Smoking, allergies, and asthma can make bronchitis worse. Repeated episodes of bronchitis may cause further lung problems.  °CAUSES °Acute bronchitis is most often caused by the same virus that causes a cold. The virus can spread from person to person (contagious) through coughing, sneezing, and touching contaminated objects. °SIGNS AND SYMPTOMS  °· Cough.   °· Fever.   °· Coughing up mucus.   °· Body aches.   °· Chest congestion.   °· Chills.   °· Shortness of breath.   °· Sore throat.   °DIAGNOSIS  °Acute bronchitis is usually diagnosed through a physical exam. Your health care provider will also ask you questions about your medical history. Tests, such as chest X-rays, are sometimes done to rule out other conditions.  °TREATMENT  °Acute bronchitis usually goes away in a couple weeks. Oftentimes, no medical treatment is necessary. Medicines are sometimes given for relief of fever or cough. Antibiotic medicines are usually not needed but may be prescribed in certain situations. In some cases, an inhaler may be recommended to help reduce shortness of breath and control the cough. A cool mist vaporizer may also be used to help thin bronchial secretions and make it easier to clear the chest.  °HOME CARE INSTRUCTIONS °· Get plenty of rest.   °· Drink enough fluids to keep your urine clear or pale yellow (unless you have a medical condition that requires fluid restriction). Increasing fluids may help thin your respiratory secretions (sputum) and reduce chest congestion, and it will prevent dehydration.   °· Take medicines only as directed by your health care provider. °· If  you were prescribed an antibiotic medicine, finish it all even if you start to feel better. °· Avoid smoking and secondhand smoke. Exposure to cigarette smoke or irritating chemicals will make bronchitis worse. If you are a smoker, consider using nicotine gum or skin patches to help control withdrawal symptoms. Quitting smoking will help your lungs heal faster.   °· Reduce the chances of another bout of acute bronchitis by washing your hands frequently, avoiding people with cold symptoms, and trying not to touch your hands to your mouth, nose, or eyes.   °· Keep all follow-up visits as directed by your health care provider.   °SEEK MEDICAL CARE IF: °Your symptoms do not improve after 1 week of treatment.  °SEEK IMMEDIATE MEDICAL CARE IF: °· You develop an increased fever or chills.   °· You have chest pain.   °· You have severe shortness of breath. °· You have bloody sputum.   °· You develop dehydration. °· You faint or repeatedly feel like you are going to pass out. °· You develop repeated vomiting. °· You develop a severe headache. °MAKE SURE YOU:  °· Understand these instructions. °· Will watch your condition. °· Will get help right away if you are not doing well or get worse. °  °This information is not intended to replace advice given to you by your health care provider. Make sure you discuss any questions you have with your health care provider. °  °Document Released: 07/25/2004 Document Revised: 07/08/2014 Document Reviewed: 12/08/2012 °Elsevier Interactive Patient Education ©2016 [employer] ° °Community-Acquired Pneumonia, Adult °Pneumonia is an infection of the lungs. There are different types of pneumonia. One type   can develop while a person is in a hospital. A different type, called community-acquired pneumonia, develops in people who are not, or have not recently been, in the hospital or other health care facility.  °CAUSES °Pneumonia may be caused by bacteria, viruses, or funguses. Community-acquired  pneumonia is often caused by Streptococcus pneumonia bacteria. These bacteria are often passed from one person to another by breathing in droplets from the cough or sneeze of an infected person. °RISK FACTORS °The condition is more likely to develop in: °· People who have chronic diseases, such as chronic obstructive pulmonary disease (COPD), asthma, congestive heart failure, cystic fibrosis, diabetes, or kidney disease. °· People who have early-stage or late-stage HIV. °· People who have sickle cell disease. °· People who have had their spleen removed (splenectomy). °· People who have poor dental hygiene. °· People who have medical conditions that increase the risk of breathing in (aspirating) secretions their own mouth and nose.   °· People who have a weakened immune system (immunocompromised). °· People who smoke. °· People who travel to areas where pneumonia-causing germs commonly exist. °· People who are around animal habitats or animals that have pneumonia-causing germs, including birds, bats, rabbits, cats, and farm animals. °SYMPTOMS °Symptoms of this condition include: °· A dry cough. °· A wet (productive) cough. °· Fever. °· Sweating. °· Chest pain, especially when breathing deeply or coughing. °· Rapid breathing or difficulty breathing. °· Shortness of breath. °· Shaking chills. °· Fatigue. °· Muscle aches. °DIAGNOSIS °Your health care provider will take a medical history and perform a physical exam. You may also have other tests, including: °· Imaging studies of your chest, including X-rays. °· Tests to check your blood oxygen level and other blood gases. °· Other tests on blood, mucus (sputum), fluid around your lungs (pleural fluid), and urine. °If your pneumonia is severe, other tests may be done to identify the specific cause of your illness. °TREATMENT °The type of treatment that you receive depends on many factors, such as the cause of your pneumonia, the medicines you take, and other medical  conditions that you have. For most adults, treatment and recovery from pneumonia may occur at home. In some cases, treatment must happen in a hospital. Treatment may include: °· Antibiotic medicines, if the pneumonia was caused by bacteria. °· Antiviral medicines, if the pneumonia was caused by a virus. °· Medicines that are given by mouth or through an IV tube. °· Oxygen. °· Respiratory therapy. °Although rare, treating severe pneumonia may include: °· Mechanical ventilation. This is done if you are not breathing well on your own and you cannot maintain a safe blood oxygen level. °· Thoracentesis. This procedure removes fluid around one lung or both lungs to help you breathe better. °HOME CARE INSTRUCTIONS °· Take over-the-counter and prescription medicines only as told by your health care provider. °¨ Only take cough medicine if you are losing sleep. Understand that cough medicine can prevent your body's natural ability to remove mucus from your lungs. °¨ If you were prescribed an antibiotic medicine, take it as told by your health care provider. Do not stop taking the antibiotic even if you start to feel better. °· Sleep in a semi-upright position at night. Try sleeping in a reclining chair, or place a few pillows under your head. °· Do not use tobacco products, including cigarettes, chewing tobacco, and e-cigarettes. If you need help quitting, ask your health care provider. °· Drink enough water to keep your urine clear or pale yellow. This will help to thin out mucus secretions in your lungs. °PREVENTION °There   are ways that you can decrease your risk of developing community-acquired pneumonia. Consider getting a pneumococcal vaccine if: °· You are older than 51 years of age. °· You are older than 51 years of age and are undergoing cancer treatment, have chronic lung disease, or have other medical conditions that affect your immune system. Ask your health care provider if this applies to you. °There are  different types and schedules of pneumococcal vaccines. Ask your health care provider which vaccination option is best for you. °You may also prevent community-acquired pneumonia if you take these actions: °· Get an influenza vaccine every year. Ask your health care provider which type of influenza vaccine is best for you. °· Go to the dentist on a regular basis. °· Wash your hands often. Use hand sanitizer if soap and water are not available. °SEEK MEDICAL CARE IF: °· You have a fever. °· You are losing sleep because you cannot control your cough with cough medicine. °SEEK IMMEDIATE MEDICAL CARE IF: °· You have worsening shortness of breath. °· You have increased chest pain. °· Your sickness becomes worse, especially if you are an older adult or have a weakened immune system. °· You cough up blood. °  °This information is not intended to replace advice given to you by your health care provider. Make sure you discuss any questions you have with your health care provider. °  °Document Released: 06/17/2005 Document Revised: 03/08/2015 Document Reviewed: 10/12/2014 °Elsevier Interactive Patient Education ©2016 [employer] ° °

## 2015-10-14 ENCOUNTER — Encounter: Payer: Self-pay | Admitting: Emergency Medicine

## 2015-10-14 ENCOUNTER — Emergency Department: Payer: Self-pay

## 2015-10-14 DIAGNOSIS — Z7951 Long term (current) use of inhaled steroids: Secondary | ICD-10-CM

## 2015-10-14 DIAGNOSIS — G4730 Sleep apnea, unspecified: Secondary | ICD-10-CM | POA: Diagnosis present

## 2015-10-14 DIAGNOSIS — I429 Cardiomyopathy, unspecified: Secondary | ICD-10-CM | POA: Diagnosis present

## 2015-10-14 DIAGNOSIS — I2510 Atherosclerotic heart disease of native coronary artery without angina pectoris: Secondary | ICD-10-CM | POA: Diagnosis present

## 2015-10-14 DIAGNOSIS — R799 Abnormal finding of blood chemistry, unspecified: Secondary | ICD-10-CM | POA: Diagnosis present

## 2015-10-14 DIAGNOSIS — R451 Restlessness and agitation: Secondary | ICD-10-CM | POA: Diagnosis not present

## 2015-10-14 DIAGNOSIS — I5023 Acute on chronic systolic (congestive) heart failure: Secondary | ICD-10-CM | POA: Diagnosis present

## 2015-10-14 DIAGNOSIS — K219 Gastro-esophageal reflux disease without esophagitis: Secondary | ICD-10-CM | POA: Diagnosis present

## 2015-10-14 DIAGNOSIS — Z79899 Other long term (current) drug therapy: Secondary | ICD-10-CM

## 2015-10-14 DIAGNOSIS — Z7952 Long term (current) use of systemic steroids: Secondary | ICD-10-CM

## 2015-10-14 DIAGNOSIS — Z79891 Long term (current) use of opiate analgesic: Secondary | ICD-10-CM

## 2015-10-14 DIAGNOSIS — Z955 Presence of coronary angioplasty implant and graft: Secondary | ICD-10-CM

## 2015-10-14 DIAGNOSIS — F17210 Nicotine dependence, cigarettes, uncomplicated: Secondary | ICD-10-CM | POA: Diagnosis present

## 2015-10-14 DIAGNOSIS — I110 Hypertensive heart disease with heart failure: Principal | ICD-10-CM | POA: Diagnosis present

## 2015-10-14 DIAGNOSIS — I252 Old myocardial infarction: Secondary | ICD-10-CM

## 2015-10-14 LAB — COMPREHENSIVE METABOLIC PANEL
ALBUMIN: 3.3 g/dL — AB (ref 3.5–5.0)
ALK PHOS: 62 U/L (ref 38–126)
ALT: 16 U/L — ABNORMAL LOW (ref 17–63)
AST: 16 U/L (ref 15–41)
Anion gap: 5 (ref 5–15)
BILIRUBIN TOTAL: 1 mg/dL (ref 0.3–1.2)
BUN: 19 mg/dL (ref 6–20)
CALCIUM: 8 mg/dL — AB (ref 8.9–10.3)
CO2: 24 mmol/L (ref 22–32)
Chloride: 108 mmol/L (ref 101–111)
Creatinine, Ser: 0.74 mg/dL (ref 0.61–1.24)
GFR calc Af Amer: >60 mL/min (ref >60)
GLUCOSE: 152 mg/dL — AB (ref 65–99)
POTASSIUM: 3.8 mmol/L (ref 3.5–5.1)
Sodium: 137 mmol/L (ref 135–145)
TOTAL PROTEIN: 7.3 g/dL (ref 6.5–8.1)

## 2015-10-14 LAB — URINALYSIS COMPLETE WITH MICROSCOPIC (ARMC ONLY)
Glucose, UA: NEGATIVE mg/dL
HGB URINE DIPSTICK: NEGATIVE
Ketones, ur: NEGATIVE mg/dL
LEUKOCYTES UA: NEGATIVE
NITRITE: NEGATIVE
Protein, ur: 100 mg/dL — AB
SPECIFIC GRAVITY, URINE: 1.032 — AB (ref 1.005–1.030)
SQUAMOUS EPITHELIAL / LPF: NONE SEEN
pH: 5 (ref 5.0–8.0)

## 2015-10-14 LAB — CBC
HEMATOCRIT: 37.9 % — AB (ref 40.0–52.0)
HEMOGLOBIN: 12.3 g/dL — AB (ref 13.0–18.0)
MCH: 28.2 pg (ref 26.0–34.0)
MCHC: 32.4 g/dL (ref 32.0–36.0)
MCV: 87.1 fL (ref 80.0–100.0)
Platelets: 333 10*3/uL (ref 150–440)
RBC: 4.35 MIL/uL — ABNORMAL LOW (ref 4.40–5.90)
RDW: 16 % — AB (ref 11.5–14.5)
WBC: 14.7 10*3/uL — AB (ref 3.8–10.6)

## 2015-10-14 LAB — BRAIN NATRIURETIC PEPTIDE: B Natriuretic Peptide: 993 pg/mL — ABNORMAL HIGH (ref 0.0–100.0)

## 2015-10-14 LAB — APTT: aPTT: 29 seconds (ref 24–36)

## 2015-10-14 LAB — PROTIME-INR
INR: 1.31
PROTHROMBIN TIME: 16.4 s — AB (ref 11.4–15.0)

## 2015-10-14 LAB — TROPONIN I

## 2015-10-14 IMAGING — CR DG CHEST 2V
1 series · 2 of 2 positions shown · non-contrast
Comparison: [DATE]

CLINICAL DATA: Bilateral lower extremity edema for 2 or 3 months.
Dyspnea.

EXAM:
CHEST  2 VIEW

[Series 1: dg chest 2 view · 0.14mm/px · 2 of 2 slices shown]
[im 1/2]
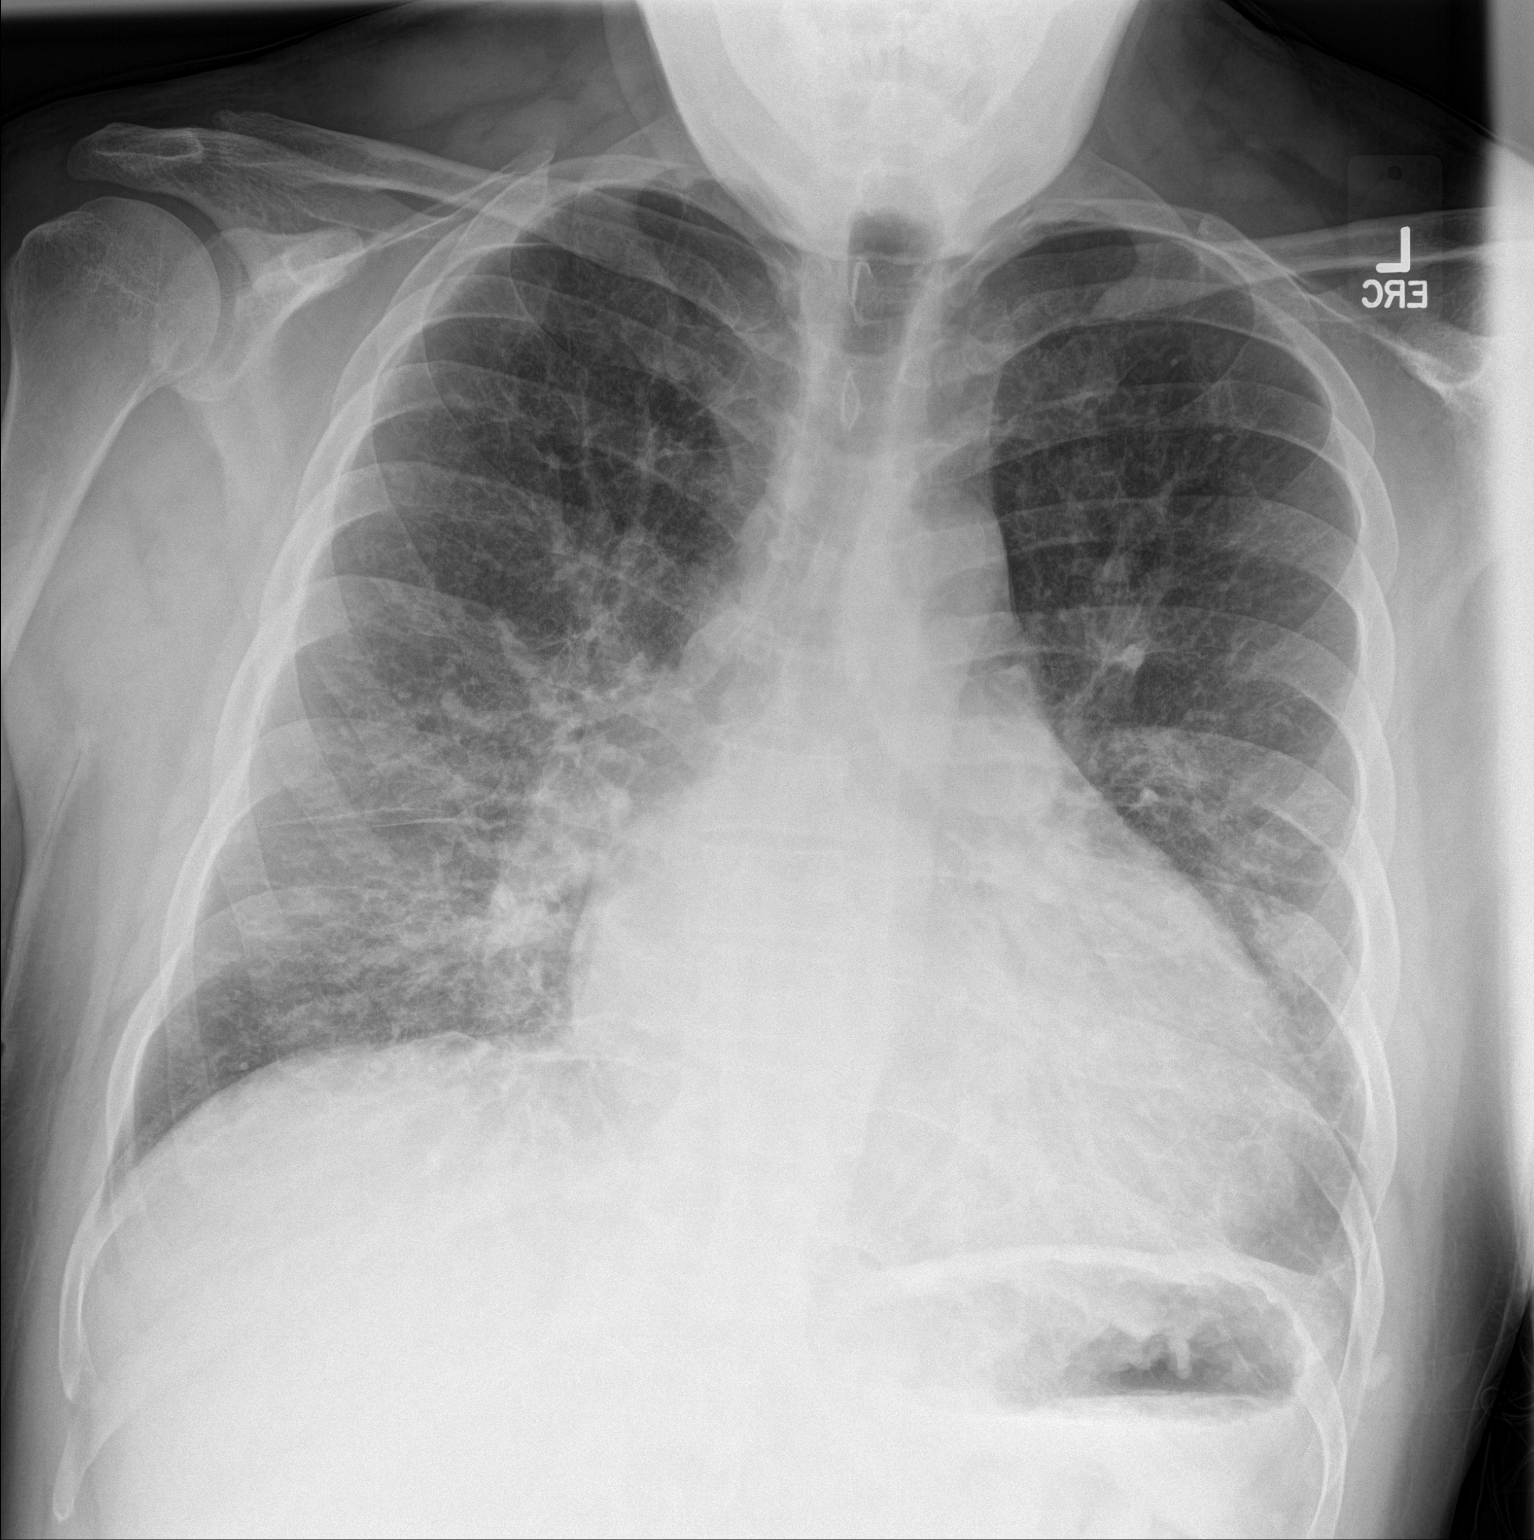
[im 2/2]
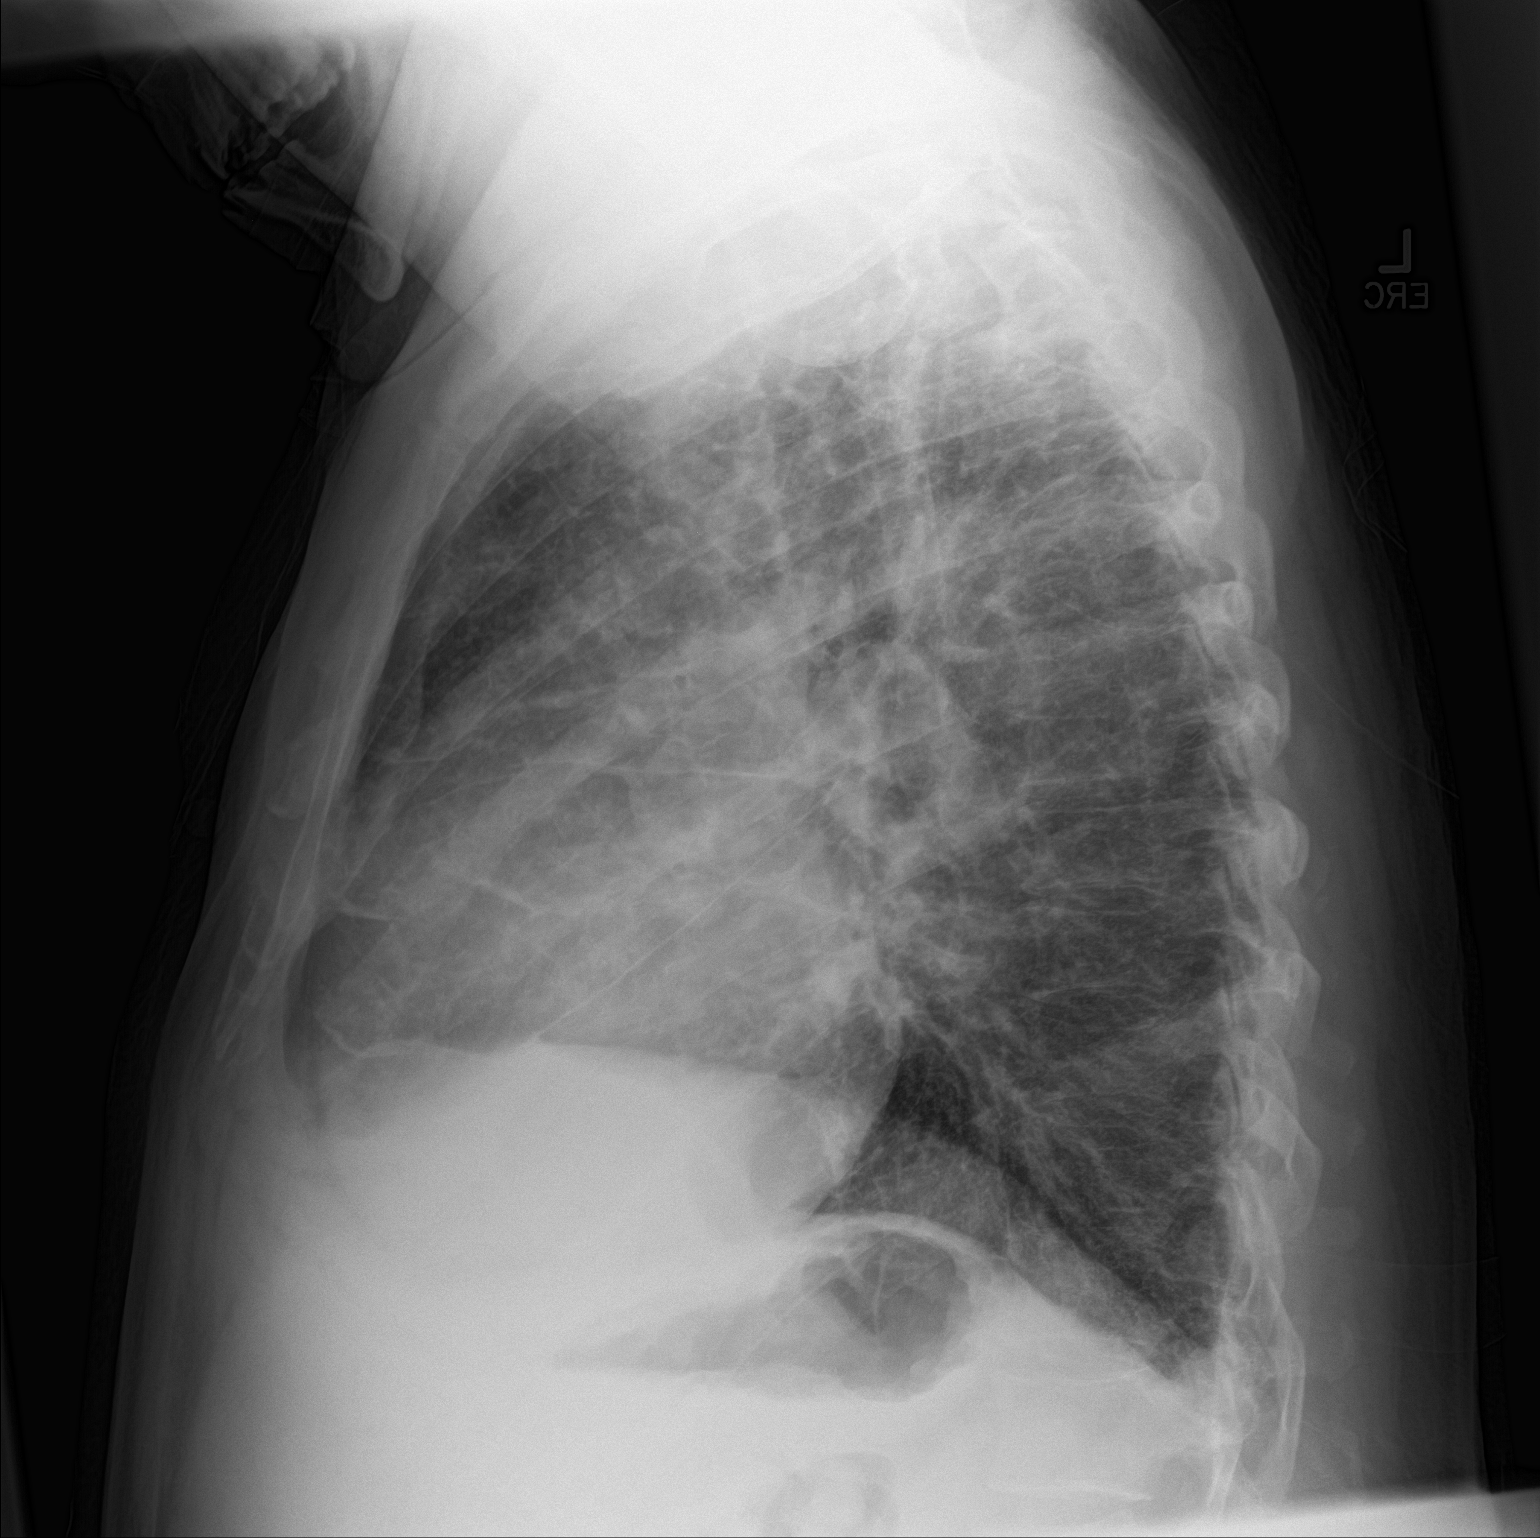

[2 of 2 positions shown; findings below may reference images not displayed]

FINDINGS: There is unchanged moderate cardiomegaly. There is vascular and
interstitial prominence which is mildly worsened. No airspace
consolidation. No effusions. Hilar and mediastinal contours are
unremarkable and unchanged.
IMPRESSION: Marked cardiomegaly with vascular and interstitial prominence,
likely representing mild congestive heart failure.

## 2015-10-14 NOTE — ED Notes (Signed)
States usually has some swelling in his legs after working x 6 months, worsing today while working. States unable to sleep x 2 days and falling asleep in the chair as i triage

## 2015-10-15 ENCOUNTER — Inpatient Hospital Stay
Admit: 2015-10-15 | Discharge: 2015-10-15 | Disposition: A | Discharge status: Home / Self Care | Payer: Self-pay | Attending: Internal Medicine | Admitting: Internal Medicine

## 2015-10-15 ENCOUNTER — Inpatient Hospital Stay
Admission: EM | Admit: 2015-10-15 | Discharge: 2015-10-16 | DRG: 287 | Disposition: A | Discharge status: Home / Self Care | Payer: Self-pay | Attending: Internal Medicine | Admitting: Internal Medicine

## 2015-10-15 ENCOUNTER — Encounter: Payer: Self-pay | Admitting: Internal Medicine

## 2015-10-15 DIAGNOSIS — J810 Acute pulmonary edema: Secondary | ICD-10-CM

## 2015-10-15 DIAGNOSIS — R7989 Other specified abnormal findings of blood chemistry: Secondary | ICD-10-CM

## 2015-10-15 DIAGNOSIS — R0601 Orthopnea: Secondary | ICD-10-CM

## 2015-10-15 DIAGNOSIS — I509 Heart failure, unspecified: Secondary | ICD-10-CM

## 2015-10-15 DIAGNOSIS — R609 Edema, unspecified: Secondary | ICD-10-CM

## 2015-10-15 HISTORY — DX: Atherosclerotic heart disease of native coronary artery without angina pectoris: I25.10

## 2015-10-15 HISTORY — DX: Gastro-esophageal reflux disease without esophagitis: K21.9

## 2015-10-15 HISTORY — DX: Old myocardial infarction: I25.2

## 2015-10-15 HISTORY — DX: Sleep apnea, unspecified: G47.30

## 2015-10-15 LAB — TROPONIN I
Troponin I: <0.03 ng/mL (ref <0.031)
Troponin I: <0.03 ng/mL (ref <0.031)

## 2015-10-15 LAB — CBC
HEMATOCRIT: 37.4 % — AB (ref 40.0–52.0)
HEMOGLOBIN: 12.4 g/dL — AB (ref 13.0–18.0)
MCH: 28.7 pg (ref 26.0–34.0)
MCHC: 33.2 g/dL (ref 32.0–36.0)
MCV: 86.4 fL (ref 80.0–100.0)
Platelets: 347 10*3/uL (ref 150–440)
RBC: 4.33 MIL/uL — AB (ref 4.40–5.90)
RDW: 16 % — ABNORMAL HIGH (ref 11.5–14.5)
WBC: 14.7 10*3/uL — AB (ref 3.8–10.6)

## 2015-10-15 LAB — ECHOCARDIOGRAM COMPLETE
Height: 70 in
Weight: 3696 oz

## 2015-10-15 LAB — CREATININE, SERUM: Creatinine, Ser: 0.84 mg/dL (ref 0.61–1.24)

## 2015-10-15 MED ORDER — ALBUTEROL SULFATE (2.5 MG/3ML) 0.083% IN NEBU: 2.5000 mg | INHALATION_SOLUTION | RESPIRATORY_TRACT | Status: DC | PRN

## 2015-10-15 MED ORDER — SODIUM CHLORIDE 0.9% FLUSH
3.0000 mL | Freq: Two times a day (BID) | INTRAVENOUS | Status: DC
  Administered 2015-10-15 (×2): 3 mL via INTRAVENOUS

## 2015-10-15 MED ORDER — LISINOPRIL 5 MG PO TABS
5.0000 mg | ORAL_TABLET | Freq: Every day | ORAL | Status: DC
  Administered 2015-10-15: 5 mg via ORAL
  Filled 2015-10-15: qty 1

## 2015-10-15 MED ORDER — KETOROLAC TROMETHAMINE 15 MG/ML IJ SOLN
30.0000 mg | Freq: Three times a day (TID) | INTRAMUSCULAR | Status: DC | PRN
  Administered 2015-10-15 – 2015-10-16 (×3): 30 mg via INTRAVENOUS
  Filled 2015-10-15 (×3): qty 2

## 2015-10-15 MED ORDER — FUROSEMIDE 10 MG/ML IJ SOLN
40.0000 mg | Freq: Two times a day (BID) | INTRAMUSCULAR | Status: DC
  Administered 2015-10-15 – 2015-10-16 (×3): 40 mg via INTRAVENOUS
  Filled 2015-10-15 (×3): qty 4

## 2015-10-15 MED ORDER — ASPIRIN EC 81 MG PO TBEC
81.0000 mg | DELAYED_RELEASE_TABLET | Freq: Every day | ORAL | Status: DC
  Administered 2015-10-15 – 2015-10-16 (×2): 81 mg via ORAL
  Filled 2015-10-15 (×2): qty 1

## 2015-10-15 MED ORDER — POTASSIUM CHLORIDE CRYS ER 10 MEQ PO TBCR
10.0000 meq | EXTENDED_RELEASE_TABLET | Freq: Every day | ORAL | Status: DC
  Administered 2015-10-15 – 2015-10-16 (×2): 10 meq via ORAL
  Filled 2015-10-15 (×2): qty 1

## 2015-10-15 MED ORDER — SODIUM CHLORIDE 0.9 % IV SOLN: 250.0000 mL | INTRAVENOUS | Status: DC | PRN

## 2015-10-15 MED ORDER — ENOXAPARIN SODIUM 40 MG/0.4ML ~~LOC~~ SOLN
40.0000 mg | SUBCUTANEOUS | Status: DC
  Administered 2015-10-15 – 2015-10-16 (×2): 40 mg via SUBCUTANEOUS
  Filled 2015-10-15 (×2): qty 0.4

## 2015-10-15 MED ORDER — ONDANSETRON HCL 4 MG/2ML IJ SOLN: 4.0000 mg | Freq: Four times a day (QID) | INTRAMUSCULAR | Status: DC | PRN

## 2015-10-15 MED ORDER — ACETAMINOPHEN 325 MG PO TABS
ORAL_TABLET | ORAL | Status: AC
  Filled 2015-10-15: qty 2

## 2015-10-15 MED ORDER — FUROSEMIDE 10 MG/ML IJ SOLN
40.0000 mg | Freq: Once | INTRAMUSCULAR | Status: AC
  Administered 2015-10-15: 40 mg via INTRAVENOUS
  Filled 2015-10-15: qty 4

## 2015-10-15 MED ORDER — SODIUM CHLORIDE 0.9% FLUSH
3.0000 mL | Freq: Two times a day (BID) | INTRAVENOUS | Status: DC
  Administered 2015-10-15: 3 mL via INTRAVENOUS

## 2015-10-15 MED ORDER — NICOTINE 21 MG/24HR TD PT24
21.0000 mg | MEDICATED_PATCH | Freq: Every day | TRANSDERMAL | Status: DC
  Administered 2015-10-15 – 2015-10-16 (×2): 21 mg via TRANSDERMAL
  Filled 2015-10-15 (×2): qty 1

## 2015-10-15 MED ORDER — KETOROLAC TROMETHAMINE 15 MG/ML IJ SOLN
15.0000 mg | Freq: Once | INTRAMUSCULAR | Status: AC
  Administered 2015-10-15: 15 mg via INTRAVENOUS
  Filled 2015-10-15: qty 1

## 2015-10-15 MED ORDER — ASPIRIN 81 MG PO CHEW
81.0000 mg | CHEWABLE_TABLET | ORAL | Status: AC
  Administered 2015-10-16: 81 mg via ORAL
  Filled 2015-10-15: qty 1

## 2015-10-15 MED ORDER — CARVEDILOL 6.25 MG PO TABS
6.2500 mg | ORAL_TABLET | Freq: Two times a day (BID) | ORAL | Status: DC
  Administered 2015-10-15 – 2015-10-16 (×2): 6.25 mg via ORAL
  Filled 2015-10-15 (×2): qty 1

## 2015-10-15 MED ORDER — SACUBITRIL-VALSARTAN 24-26 MG PO TABS
1.0000 | ORAL_TABLET | Freq: Two times a day (BID) | ORAL | Status: DC
  Filled 2015-10-15: qty 1

## 2015-10-15 MED ORDER — SODIUM CHLORIDE 0.9 % WEIGHT BASED INFUSION
3.0000 mL/kg/h | INTRAVENOUS | Status: AC
Start: 2015-10-16 — End: 2015-10-16

## 2015-10-15 MED ORDER — ACETAMINOPHEN 325 MG PO TABS
650.0000 mg | ORAL_TABLET | Freq: Once | ORAL | Status: AC
  Administered 2015-10-15: 650 mg via ORAL
  Filled 2015-10-15: qty 2

## 2015-10-15 MED ORDER — SODIUM CHLORIDE 0.9% FLUSH: 3.0000 mL | INTRAVENOUS | Status: DC | PRN

## 2015-10-15 MED ORDER — OXYCODONE-ACETAMINOPHEN 5-325 MG PO TABS
1.0000 | ORAL_TABLET | ORAL | Status: DC | PRN
  Administered 2015-10-15 – 2015-10-16 (×4): 2 via ORAL
  Filled 2015-10-15 (×4): qty 2

## 2015-10-15 MED ORDER — SPIRONOLACTONE 25 MG PO TABS
25.0000 mg | ORAL_TABLET | Freq: Every day | ORAL | Status: DC
  Administered 2015-10-15 – 2015-10-16 (×2): 25 mg via ORAL
  Filled 2015-10-15 (×2): qty 1

## 2015-10-15 MED ORDER — ALBUTEROL SULFATE HFA 108 (90 BASE) MCG/ACT IN AERS: 2.0000 | INHALATION_SPRAY | RESPIRATORY_TRACT | Status: DC | PRN

## 2015-10-15 MED ORDER — ACETAMINOPHEN 325 MG PO TABS: 650.0000 mg | ORAL_TABLET | ORAL | Status: DC | PRN

## 2015-10-15 MED ORDER — SODIUM CHLORIDE 0.9 % WEIGHT BASED INFUSION: 1.0000 mL/kg/h | INTRAVENOUS | Status: DC

## 2015-10-15 NOTE — Progress Notes (Signed)
Found pt asleep. Attempted to place CPAP for sleep. Pt refused and states that he isn't ready to wear it.

## 2015-10-15 NOTE — Progress Notes (Signed)
Pt very restless. Cigarettes were taken due to pt smoking in the room. Cath scheduled for 4/17. Pt reports pain and received toradol. Room air. NSR. Ambulating in the room and tolerated it well. Family at the bedside. Pt has no further concerns at this time.

## 2015-10-15 NOTE — Progress Notes (Signed)
Brian BailiffDavid M Porter is a 50 y.o. male  [id number]  Primary Cardiologist: Adrian BlackwaterShaukat Avalyn Molino Reason for Consultation: CHF  HPI: 50 year old white male with a past medical history of coronary artery disease myocardial infarction and PCI and stenting of unknown vessel 67 years back. Presents now with several weeks history of shortness of breath orthopnea PND and leg swelling.   Review of Systems: No chest pain   Past Medical History  Diagnosis Date  . Hypertension   . MI, old   . CAD (coronary artery disease)   . Sleep apnea   . GERD (gastroesophageal reflux disease)     Medications Prior to Admission  Medication Sig Dispense Refill  . albuterol (PROVENTIL HFA) 108 (90 Base) MCG/ACT inhaler Inhale 2 puffs into the lungs every 4 (four) hours as needed for wheezing or shortness of breath. 1 Inhaler 0  . cyclobenzaprine (FLEXERIL) 10 MG tablet Take 1 tablet (10 mg total) by mouth every 8 (eight) hours as needed for muscle spasms. 30 tablet 1  . naproxen (NAPROSYN) 500 MG tablet Take 1 tablet (500 mg total) by mouth 2 (two) times daily with a meal. 20 tablet 0  . ranitidine (ZANTAC) 150 MG tablet Take 150 mg by mouth 2 (two) times daily as needed for heartburn.    . traMADol (ULTRAM) 50 MG tablet Take 1 tablet (50 mg total) by mouth every 6 (six) hours as needed. 20 tablet 0  . azithromycin (ZITHROMAX Z-PAK) 250 MG tablet Take 2 tablets (500 mg) on  Day 1,  followed by 1 tablet (250 mg) once daily on Days 2 through 5. (Patient not taking: Reported on 10/15/2015) 6 each 0  . diclofenac (VOLTAREN) 75 MG EC tablet Take 1 tablet (75 mg total) by mouth 2 (two) times daily. (Patient not taking: Reported on 08/03/2015) 60 tablet 0  . oxyCODONE-acetaminophen (ROXICET) 5-325 MG tablet Take 1-2 tablets by mouth every 4 (four) hours as needed for severe pain. (Patient not taking: Reported on 10/15/2015) 15 tablet 0  . predniSONE (DELTASONE) 20 MG tablet Take 2 tablets (40 mg total) by mouth daily. (Patient not  taking: Reported on 10/15/2015) 8 tablet 0     . aspirin EC  81 mg Oral Daily  . enoxaparin (LOVENOX) injection  40 mg Subcutaneous Q24H  . furosemide  40 mg Intravenous Q12H  . lisinopril  5 mg Oral Daily  . nicotine  21 mg Transdermal Daily  . potassium chloride  10 mEq Oral Daily  . sodium chloride flush  3 mL Intravenous Q12H    Infusions:    No Known Allergies  Social History   Social History  . Marital Status: Single    Spouse Name: N/A  . Number of Children: N/A  . Years of Education: N/A   Occupational History  . has a pressure washing unit    Social History Main Topics  . Smoking status: Current Every Day Smoker -- 1.00 packs/day    Types: Cigarettes  . Smokeless tobacco: Not on file  . Alcohol Use: No  . Drug Use: No  . Sexual Activity: Not on file   Other Topics Concern  . Not on file   Social History Narrative    Family History  Problem Relation Age of Onset  . Diabetes Mellitus II Brother   . Heart failure Mother   . Hypertension Mother     PHYSICAL EXAM: Filed Vitals:   10/15/15 0730 10/15/15 0746  BP: 116/85 116/82  Pulse: 101 107  Temp:  97.8 F (36.6 C)  Resp: 16 16     Intake/Output Summary (Last 24 hours) at 10/15/15 1039 Last data filed at 10/15/15 0755  Gross per 24 hour  Intake      0 ml  Output   1475 ml  Net  -1475 ml    General:  Well appearing. No respiratory difficulty HEENT: normal Neck: supple. no JVD. Carotids 2+ bilat; no bruits. No lymphadenopathy or thryomegaly appreciated. Cor: PMI nondisplaced. Regular rate & rhythm. No rubs, gallops or murmurs. Lungs: clear Abdomen: soft, nontender, nondistended. No hepatosplenomegaly. No bruits or masses. Good bowel sounds. Extremities: no cyanosis, clubbing, rash, edema Neuro: alert & oriented x 3, cranial nerves grossly intact. moves all 4 extremities w/o difficulty. Affect pleasant.  ECG: Sinus tachycardia with LVH, left atrial enlargement and poor R-wave  progression  Results for orders placed or performed during the hospital encounter of 10/15/15 (from the past 24 hour(s))  CBC     Status: Abnormal   Collection Time: 10/14/15  8:55 PM  Result Value Ref Range   WBC 14.7 (H) 3.8 - 10.6 K/uL   RBC 4.35 (L) 4.40 - 5.90 MIL/uL   Hemoglobin 12.3 (L) 13.0 - 18.0 g/dL   HCT 16.1 (L) 09.6 - 04.5 %   MCV 87.1 80.0 - 100.0 fL   MCH 28.2 26.0 - 34.0 pg   MCHC 32.4 32.0 - 36.0 g/dL   RDW 40.9 (H) 81.1 - 91.4 %   Platelets 333 150 - 440 K/uL  Comprehensive metabolic panel     Status: Abnormal   Collection Time: 10/14/15  8:55 PM  Result Value Ref Range   Sodium 137 135 - 145 mmol/L   Potassium 3.8 3.5 - 5.1 mmol/L   Chloride 108 101 - 111 mmol/L   CO2 24 22 - 32 mmol/L   Glucose, Bld 152 (H) 65 - 99 mg/dL   BUN 19 6 - 20 mg/dL   Creatinine, Ser 7.82 0.61 - 1.24 mg/dL   Calcium 8.0 (L) 8.9 - 10.3 mg/dL   Total Protein 7.3 6.5 - 8.1 g/dL   Albumin 3.3 (L) 3.5 - 5.0 g/dL   AST 16 15 - 41 U/L   ALT 16 (L) 17 - 63 U/L   Alkaline Phosphatase 62 38 - 126 U/L   Total Bilirubin 1.0 0.3 - 1.2 mg/dL   GFR calc non Af Amer >60 >60 mL/min   GFR calc Af Amer >60 >60 mL/min   Anion gap 5 5 - 15  Troponin I     Status: None   Collection Time: 10/14/15  8:55 PM  Result Value Ref Range   Troponin I <0.03 <0.031 ng/mL  Brain natriuretic peptide     Status: Abnormal   Collection Time: 10/14/15  8:55 PM  Result Value Ref Range   B Natriuretic Peptide 993.0 (H) 0.0 - 100.0 pg/mL  APTT     Status: None   Collection Time: 10/14/15  8:55 PM  Result Value Ref Range   aPTT 29 24 - 36 seconds  Protime-INR     Status: Abnormal   Collection Time: 10/14/15  8:55 PM  Result Value Ref Range   Prothrombin Time 16.4 (H) 11.4 - 15.0 seconds   INR 1.31   Urinalysis complete, with microscopic ([facility] only)     Status: Abnormal   Collection Time: 10/14/15  8:55 PM  Result Value Ref Range   Color, Urine AMBER (A) YELLOW   APPearance CLEAR (A) CLEAR  Glucose, UA  NEGATIVE NEGATIVE mg/dL   Bilirubin Urine 1+ (A) NEGATIVE   Ketones, ur NEGATIVE NEGATIVE mg/dL   Specific Gravity, Urine 1.032 (H) 1.005 - 1.030   Hgb urine dipstick NEGATIVE NEGATIVE   pH 5.0 5.0 - 8.0   Protein, ur 100 (A) NEGATIVE mg/dL   Nitrite NEGATIVE NEGATIVE   Leukocytes, UA NEGATIVE NEGATIVE   RBC / HPF 6-30 0 - 5 RBC/hpf   WBC, UA 6-30 0 - 5 WBC/hpf   Bacteria, UA RARE (A) NONE SEEN   Squamous Epithelial / LPF NONE SEEN NONE SEEN   Mucous PRESENT   CBC     Status: Abnormal   Collection Time: 10/15/15  9:41 AM  Result Value Ref Range   WBC 14.7 (H) 3.8 - 10.6 K/uL   RBC 4.33 (L) 4.40 - 5.90 MIL/uL   Hemoglobin 12.4 (L) 13.0 - 18.0 g/dL   HCT 16.1 (L) 09.6 - 04.5 %   MCV 86.4 80.0 - 100.0 fL   MCH 28.7 26.0 - 34.0 pg   MCHC 33.2 32.0 - 36.0 g/dL   RDW 40.9 (H) 81.1 - 91.4 %   Platelets 347 150 - 440 K/uL   Dg Chest 2 View  10/14/2015  CLINICAL DATA:  Bilateral lower extremity edema for 2 or 3 months. Dyspnea. EXAM: CHEST  2 VIEW COMPARISON:  09/05/2015 FINDINGS: There is unchanged moderate cardiomegaly. There is vascular and interstitial prominence which is mildly worsened. No airspace consolidation. No effusions. Hilar and mediastinal contours are unremarkable and unchanged. IMPRESSION: Marked cardiomegaly with vascular and interstitial prominence, likely representing mild congestive heart failure. Electronically Signed   By: Ellery Plunk M.D.   On: 10/14/2015 22:38     ASSESSMENT AND PLAN:Congestive heart failure due to systolic dysfunction with left ventricular ejection fraction 33-35% on echocardiogram. Patient presented with new onset CHF and has history of coronary artery disease. Patient needs to be evaluated to rule out coronary artery disease versus idiopathic cardiomyopathy as the cause of CHF. Thus will do cardiac catheterization tomorrow.  Brian Porter A

## 2015-10-15 NOTE — H&P (Signed)
[facility] [place] [facility]   PATIENT NAME: Brian Porter    MR#:  [id number]  DATE OF BIRTH:  Mar 08, 1966  DATE OF ADMISSION:  10/15/2015  PRIMARY CARE PHYSICIAN: No PCP Per Patient   REQUESTING/REFERRING PHYSICIAN:   CHIEF COMPLAINT:   Chief Complaint  Patient presents with  . Leg Swelling    HISTORY OF PRESENT ILLNESS: Brian Porter  is a 50 y.o. male with a known history of hypertension, myocardial infarction, sleep apnea, GERD, coronary artery disease presented to the emergency room with swelling of the legs. Patient has the swelling in the legs going on for the last couple of months which is gradually worsening. He also has more shortness of breath for the last couple of weeks. Has history of orthopnea or proximal nocturnal dyspnea. No complaints of chest pain. Patient was evaluated in the emergency room was found to have edema in both the legs as well as pulmonary congestion. Patient does not have a history of heart failure. No history of fever or chills. First set of troponin is negative.  PAST MEDICAL HISTORY:   Past Medical History  Diagnosis Date  . Hypertension   . MI, old   . CAD (coronary artery disease)   . Sleep apnea   . GERD (gastroesophageal reflux disease)     PAST SURGICAL HISTORY: Past Surgical History  Procedure Laterality Date  . Splenectomy    . Partial nephrectomy Left   . Coronary angioplasty with stent placement  approx 2 years ago    SOCIAL HISTORY:  Social History  Substance Use Topics  . Smoking status: Current Every Day Smoker -- 1.00 packs/day    Types: Cigarettes  . Smokeless tobacco: Not on file  . Alcohol Use: No    FAMILY HISTORY:  Family History  Problem Relation Age of Onset  . Diabetes Mellitus II Brother   . Heart failure Mother   . Hypertension Mother     DRUG ALLERGIES: No Known Allergies  REVIEW OF SYSTEMS:   CONSTITUTIONAL: No fever, fatigue or weakness.  EYES: No blurred or double  vision.  EARS, NOSE, AND THROAT: No tinnitus or ear pain.  RESPIRATORY: No cough, has shortness of breath, no wheezing or hemoptysis.  CARDIOVASCULAR: No chest pain, orthopnea, edema.  GASTROINTESTINAL: No nausea, vomiting, diarrhea or abdominal pain.  GENITOURINARY: No dysuria, hematuria.  ENDOCRINE: No polyuria, nocturia,  HEMATOLOGY: No anemia, easy bruising or bleeding SKIN: No rash or lesion. MUSCULOSKELETAL: No joint pain or arthritis.  Swelling in the legs. NEUROLOGIC: No tingling, numbness, weakness.  PSYCHIATRY: No anxiety or depression.   MEDICATIONS AT HOME:  Prior to Admission medications   Medication Sig Start Date End Date Taking? Authorizing Provider  albuterol (PROVENTIL HFA) 108 (90 Base) MCG/ACT inhaler Inhale 2 puffs into the lungs every 4 (four) hours as needed for wheezing or shortness of breath. 09/05/15  Yes Sharman Cheek, MD  cyclobenzaprine (FLEXERIL) 10 MG tablet Take 1 tablet (10 mg total) by mouth every 8 (eight) hours as needed for muscle spasms. 07/31/15  Yes Charmayne Sheer Beers, PA-C  naproxen (NAPROSYN) 500 MG tablet Take 1 tablet (500 mg total) by mouth 2 (two) times daily with a meal. 09/05/15  Yes Sharman Cheek, MD  ranitidine (ZANTAC) 150 MG tablet Take 150 mg by mouth 2 (two) times daily as needed for heartburn.   Yes Historical Provider, MD  traMADol (ULTRAM) 50 MG tablet Take 1 tablet (50 mg total) by mouth every 6 (six) hours  as needed. 08/06/15  Yes Jennye MoccasinBrian S Quigley, MD  azithromycin (ZITHROMAX Z-PAK) 250 MG tablet Take 2 tablets (500 mg) on  Day 1,  followed by 1 tablet (250 mg) once daily on Days 2 through 5. Patient not taking: Reported on 10/15/2015 09/05/15   Sharman CheekPhillip Stafford, MD  diclofenac (VOLTAREN) 75 MG EC tablet Take 1 tablet (75 mg total) by mouth 2 (two) times daily. Patient not taking: Reported on 08/03/2015 07/31/15   Charmayne Sheerharles M Beers, PA-C  oxyCODONE-acetaminophen (ROXICET) 5-325 MG tablet Take 1-2 tablets by mouth every 4 (four) hours as needed  for severe pain. Patient not taking: Reported on 10/15/2015 07/31/15   Charmayne Sheerharles M Beers, PA-C  predniSONE (DELTASONE) 20 MG tablet Take 2 tablets (40 mg total) by mouth daily. Patient not taking: Reported on 10/15/2015 09/05/15   Sharman CheekPhillip Stafford, MD      PHYSICAL EXAMINATION:   VITAL SIGNS: Blood pressure 122/93, pulse 109, temperature 98.3 F (36.8 C), resp. rate 16, height 5\' 10"  (1.778 m), weight 108.863 kg (240 lb), SpO2 100 %.  GENERAL:  50 y.o.-year-old patient lying in the bed with no acute distress.  EYES: Pupils equal, round, reactive to light and accommodation. No scleral icterus. Extraocular muscles intact.  HEENT: Head atraumatic, normocephalic. Oropharynx and nasopharynx clear.  NECK:  Supple, no jugular venous distention. No thyroid enlargement, no tenderness.  LUNGS: Decreased breath sounds bilaterally, basal crepitations heard. No use of accessory muscles of respiration.  CARDIOVASCULAR: S1, S2 normal. No murmurs, rubs, or gallops.  ABDOMEN: Soft, nontender, nondistended. Bowel sounds present. No organomegaly or mass.  EXTREMITIES: 3 plus pedal edema, no cyanosis, or clubbing.  NEUROLOGIC: Cranial nerves II through XII are intact. Muscle strength 5/5 in all extremities. Sensation intact. Gait normal. PSYCHIATRIC: The patient is alert and oriented x 3.  SKIN: No obvious rash, lesion, or ulcer.   LABORATORY PANEL:   CBC  Recent Labs Lab 10/14/15 2055  WBC 14.7*  HGB 12.3*  HCT 37.9*  PLT 333  MCV 87.1  MCH 28.2  MCHC 32.4  RDW 16.0*   ------------------------------------------------------------------------------------------------------------------  Chemistries   Recent Labs Lab 10/14/15 2055  NA 137  K 3.8  CL 108  CO2 24  GLUCOSE 152*  BUN 19  CREATININE 0.74  CALCIUM 8.0*  AST 16  ALT 16*  ALKPHOS 62  BILITOT 1.0   ------------------------------------------------------------------------------------------------------------------ estimated  creatinine clearance is 136.6 mL/min (by C-G formula based on Cr of 0.74). ------------------------------------------------------------------------------------------------------------------ No results for input(s): TSH, T4TOTAL, T3FREE, THYROIDAB in the last 72 hours.  Invalid input(s): FREET3   Coagulation profile  Recent Labs Lab 10/14/15 2055  INR 1.31   ------------------------------------------------------------------------------------------------------------------- No results for input(s): DDIMER in the last 72 hours. -------------------------------------------------------------------------------------------------------------------  Cardiac Enzymes  Recent Labs Lab 10/14/15 2055  TROPONINI <0.03   ------------------------------------------------------------------------------------------------------------------ Invalid input(s): POCBNP  ---------------------------------------------------------------------------------------------------------------  Urinalysis    Component Value Date/Time   COLORURINE AMBER* 10/14/2015 2055   APPEARANCEUR CLEAR* 10/14/2015 2055   LABSPEC 1.032* 10/14/2015 2055   PHURINE 5.0 10/14/2015 2055   GLUCOSEU NEGATIVE 10/14/2015 2055   HGBUR NEGATIVE 10/14/2015 2055   BILIRUBINUR 1+* 10/14/2015 2055   KETONESUR NEGATIVE 10/14/2015 2055   PROTEINUR 100* 10/14/2015 2055   UROBILINOGEN 1.0 12/10/2014 2035   NITRITE NEGATIVE 10/14/2015 2055   LEUKOCYTESUR NEGATIVE 10/14/2015 2055     RADIOLOGY: Dg Chest 2 View  10/14/2015  CLINICAL DATA:  Bilateral lower extremity edema for 2 or 3 months. Dyspnea. EXAM: CHEST  2 VIEW COMPARISON:  09/05/2015 FINDINGS: There  is unchanged moderate cardiomegaly. There is vascular and interstitial prominence which is mildly worsened. No airspace consolidation. No effusions. Hilar and mediastinal contours are unremarkable and unchanged. IMPRESSION: Marked cardiomegaly with vascular and interstitial prominence,  likely representing mild congestive heart failure. Electronically Signed   By: Ellery Plunk M.D.   On: 10/14/2015 22:38    EKG: Orders placed or performed during the hospital encounter of 10/15/15  . EKG 12-Lead  . EKG 12-Lead  . ED EKG  . ED EKG    IMPRESSION AND PLAN: 50 year old male patient with history of hypertension GERD sleep apnea and myocardial infarction presented to the emergency room with swelling of the legs and dyspnea . Admitting diagnosis  1. New onset congestive heart failure  2. Bilateral lower extremity edema  3. Fluid overload  4. Hypertension  5. GERD   treatment plan Admit patient to telemetry Diurese patient with IV Lasix Check echocardiogram Start patient on oral aspirin and ACE inhibitor. Cardiology consultation Resume home medications DVT prophylaxis with subcutaneous Lovenox 40 MG daily  All the rec and MIords are reviewed and case discussed with ED provider. Management plans discussed with the patient, family and they are in agreement.  CODE STATUS:FULL Code Status History    This patient does not have a recorded code status. Please follow your organizational policy for patients in this situation.       TOTAL TIME TAKING CARE OF THIS PATIENT: 53 minutes.    Ihor Austin M.D on 10/15/2015 at 6:10 AM  Between 7am to 6pm - Pager - [phone]  After 6pm go to [email or web address] - password [email or web address]  [facility]  Office  [phone]  CC: Primary care physician; No PCP Per Patient

## 2015-10-15 NOTE — Progress Notes (Signed)
*  PRELIMINARY RESULTS* Echocardiogram 2D Echocardiogram has been performed.  Brian Porter 10/15/2015, 8:28 AM

## 2015-10-15 NOTE — ED Provider Notes (Signed)
[facility] Emergency Department Provider Note   ____________________________________________  Time seen: ~0350  I have reviewed the triage vital signs and the nursing notes.   HISTORY  Chief Complaint Leg Swelling   History limited by: Not Limited   HPI Brian Porter is a 50 y.o. male with history of MI and cardiac stent 2 presents to the emergency department today with primary concerns for lower leg swelling. Patient states that the swelling has been gradually worsening over the past number of months. He states that the discomfort and back pain. Is located in bilateral lower legs. He states he also has noticed swelling in his hands. During this. The patient is alsoincreasing shortness of breath. He does state that he now primarily sleeps in a chair given that it is harder to sleep lying flat. The patient denies any chest pain during this time. Denies any fevers.    Past Medical History  Diagnosis Date  . Hypertension   . MI, old     Patient Active Problem List   Diagnosis Date Noted  . Chest pain 02/22/2015  . Chest pain at rest 02/22/2015    Past Surgical History  Procedure Laterality Date  . Splenectomy    . Partial nephrectomy Left   . Coronary angioplasty with stent placement  approx 2 years ago    Current Outpatient Rx  Name  Route  Sig  Dispense  Refill  . albuterol (PROVENTIL HFA) 108 (90 Base) MCG/ACT inhaler   Inhalation   Inhale 2 puffs into the lungs every 4 (four) hours as needed for wheezing or shortness of breath.   1 Inhaler   0   . azithromycin (ZITHROMAX Z-PAK) 250 MG tablet      Take 2 tablets (500 mg) on  Day 1,  followed by 1 tablet (250 mg) once daily on Days 2 through 5.   6 each   0   . cyclobenzaprine (FLEXERIL) 10 MG tablet   Oral   Take 1 tablet (10 mg total) by mouth every 8 (eight) hours as needed for muscle spasms.   30 tablet   1   . diclofenac (VOLTAREN) 75 MG EC tablet   Oral   Take 1 tablet (75  mg total) by mouth 2 (two) times daily. Patient not taking: Reported on 08/03/2015   60 tablet   0   . naproxen (NAPROSYN) 500 MG tablet   Oral   Take 1 tablet (500 mg total) by mouth 2 (two) times daily with a meal.   20 tablet   0   . oxyCODONE-acetaminophen (ROXICET) 5-325 MG tablet   Oral   Take 1-2 tablets by mouth every 4 (four) hours as needed for severe pain.   15 tablet   0   . predniSONE (DELTASONE) 20 MG tablet   Oral   Take 2 tablets (40 mg total) by mouth daily.   8 tablet   0   . ranitidine (ZANTAC) 150 MG tablet   Oral   Take 150 mg by mouth 2 (two) times daily as needed for heartburn.         . traMADol (ULTRAM) 50 MG tablet   Oral   Take 1 tablet (50 mg total) by mouth every 6 (six) hours as needed.   20 tablet   0     Allergies Review of patient's allergies indicates no known allergies.  History reviewed. No pertinent family history.  Social History Social History  Substance Use Topics  .  Smoking status: Current Every Day Smoker -- 1.00 packs/day    Types: Cigarettes  . Smokeless tobacco: None  . Alcohol Use: No    Review of Systems  Constitutional: Negative for fever. Cardiovascular: Negative for chest pain. Respiratory: Positive for shortness of breath. Gastrointestinal: Negative for abdominal pain, vomiting and diarrhea. Neurological: Negative for headaches, focal weakness or numbness.   10-point ROS otherwise negative.  ____________________________________________   PHYSICAL EXAM:  VITAL SIGNS: ED Triage Vitals  Enc Vitals Group     BP 10/14/15 2040 112/72 mmHg     Pulse Rate 10/14/15 2040 103     Resp 10/14/15 2040 18     Temp 10/14/15 2040 98.3 F (36.8 C)     Temp src --      SpO2 10/14/15 2040 94 %     Weight 10/14/15 2040 240 lb (108.863 kg)     Height 10/14/15 2040  (1.778 m)     Head Cir --      Peak Flow --      Pain Score 10/14/15 2041 8   Constitutional: Alert and oriented. Well appearing and in no  distress. Eyes: Conjunctivae are normal. PERRL. Normal extraocular movements. ENT   Head: Normocephalic and atraumatic.   Nose: No congestion/rhinnorhea.   Mouth/Throat: Mucous membranes are moist.   Neck: No stridor. Hematological/Lymphatic/Immunilogical: No cervical lymphadenopathy. Cardiovascular: Normal rate, regular rhythm.  No murmurs, rubs, or gallops. Respiratory: Normal respiratory effort without tachypnea nor retractions. Crackles appreciated in bilateral lower lungs Gastrointestinal: Soft and nontender. No distention.  Genitourinary: Deferred Musculoskeletal: Normal range of motion in all extremities. No joint effusions. Bilateral pitting edema Neurologic:  Normal speech and language. No gross focal neurologic deficits are appreciated.  Skin:  Skin is warm, dry and intact. No rash noted. Psychiatric: Mood and affect are normal. Speech and behavior are normal. Patient exhibits appropriate insight and judgment.  ____________________________________________    LABS (pertinent positives/negatives)  Labs Reviewed  CBC - Abnormal; Notable for the following:    WBC 14.7 (*)    RBC 4.35 (*)    Hemoglobin 12.3 (*)    HCT 37.9 (*)    RDW 16.0 (*)    All other components within normal limits  COMPREHENSIVE METABOLIC PANEL - Abnormal; Notable for the following:    Glucose, Bld 152 (*)    Calcium 8.0 (*)    Albumin 3.3 (*)    ALT 16 (*)    All other components within normal limits  BRAIN NATRIURETIC PEPTIDE - Abnormal; Notable for the following:    B Natriuretic Peptide 993.0 (*)    All other components within normal limits  PROTIME-INR - Abnormal; Notable for the following:    Prothrombin Time 16.4 (*)    All other components within normal limits  URINALYSIS COMPLETEWITH MICROSCOPIC ([facility] ONLY) - Abnormal; Notable for the following:    Color, Urine AMBER (*)    APPearance CLEAR (*)    Bilirubin Urine 1+ (*)    Specific Gravity, Urine 1.032 (*)    Protein, ur  100 (*)    Bacteria, UA RARE (*)    All other components within normal limits  TROPONIN I  APTT     ____________________________________________   EKG  I, Phineas Semen, attending physician, personally viewed and interpreted this EKG  EKG Time: 2046 Rate: 103 Rhythm: sinus tachycardia Axis: left axis deviation Intervals: qtc 482 QRS: LAFB ST changes: no st elevation Impression: abnormal ekg   ____________________________________________    RADIOLOGY  CXR IMPRESSION: Marked cardiomegaly with vascular and interstitial prominence, likely representing mild congestive heart failure.  ____________________________________________   PROCEDURES  Procedure(s) performed: None  Critical Care performed: No  ____________________________________________   INITIAL IMPRESSION / ASSESSMENT AND PLAN / ED COURSE  Pertinent labs & imaging results that were available during my care of the patient were reviewed by me and considered in my medical decision making (see chart for details).  Patient presented to the emergency department today because of concerns for swelling and shortness of breath. On exam patient with crackles in bilateral pitting edema. Furthermore BNP significantly elevated. Chest x-ray with findings concerning for congestive heart failure. Patient does not have the diagnosis of heart failure. Given that the patient has history of coronary artery disease Will admit to hospitalist for further workup and evaluation. Will give patient IV Lasix.  ____________________________________________   FINAL CLINICAL IMPRESSION(S) / ED DIAGNOSES  Final diagnoses:  Peripheral edema  Elevated brain natriuretic peptide (BNP) level  Acute pulmonary edema (HCC)  Orthopnea     Phineas SemenGraydon Juleen Sorrels, MD 10/15/15 [phone]

## 2015-10-15 NOTE — Progress Notes (Signed)
MD Gouru was notified of leg pain. Toradol order was placed. Pt cigarettes were taken due to smoking in the room. Pt was advised about not smoking in the room. Nicotine patch was placed on pt.

## 2015-10-15 NOTE — Progress Notes (Signed)
[facility] [place] [facility]   PATIENT NAME: Brian MeyerDavid Porter    MR#:  [id number]  DATE OF BIRTH:  06/21/1966  SUBJECTIVE:  CHIEF COMPLAINT:  The patient is out of bed, ambulating in the room, restless and smoking Asking more pain medicines for LE swelling   REVIEW OF SYSTEMS:  CONSTITUTIONAL: No fever, fatigue or weakness.  EYES: No blurred or double vision.  EARS, NOSE, AND THROAT: No tinnitus or ear pain.  RESPIRATORY: Reports exertional shortness of breath, denies wheezing or hemoptysis.  CARDIOVASCULAR: No chest pain, orthopnea, edema.  GASTROINTESTINAL: No nausea, vomiting, diarrhea or abdominal pain.  GENITOURINARY: No dysuria, hematuria.  ENDOCRINE: No polyuria, nocturia,  HEMATOLOGY: No anemia, easy bruising or bleeding SKIN: No rash or lesion. MUSCULOSKELETAL: Reporting lower extent is swelling and pain .No joint pain or arthritis.   NEUROLOGIC: No tingling, numbness, weakness.  PSYCHIATRY: No anxiety or depression.   DRUG ALLERGIES:  No Known Allergies  VITALS:  Blood pressure 106/80, pulse 96, temperature 97.6 F (36.4 C), temperature source Oral, resp. rate 16, height 5\' 10"  (1.778 m), weight 104.781 kg (231 lb), SpO2 98 %.  PHYSICAL EXAMINATION:  GENERAL:  50 y.o.-year-old patient lying in the bed with no acute distress.  EYES: Pupils equal, round, reactive to light and accommodation. No scleral icterus. Extraocular muscles intact.  HEENT: Head atraumatic, normocephalic. Oropharynx and nasopharynx clear.  NECK:  Supple, no jugular venous distention. No thyroid enlargement, no tenderness.  LUNGS: Moderate breath sounds bilaterally, no wheezing, positive rales,rhonchi or crepitation. No use of accessory muscles of respiration.  CARDIOVASCULAR: S1, S2 normal. No murmurs, rubs, or gallops.  ABDOMEN: Soft, nontender, nondistended. Bowel sounds present. No organomegaly or mass.  EXTREMITIES: 2-3+ pedal edema, no cyanosis, or clubbing.   NEUROLOGIC: Cranial nerves II through XII are intact. Muscle strength 5/5 in all extremities. Sensation intact. Gait not checked.  PSYCHIATRIC: The patient is alert and oriented x 3.  SKIN: No obvious rash, lesion, or ulcer.    LABORATORY PANEL:   CBC  Recent Labs Lab 10/15/15 0941  WBC 14.7*  HGB 12.4*  HCT 37.4*  PLT 347   ------------------------------------------------------------------------------------------------------------------  Chemistries   Recent Labs Lab 10/14/15 2055 10/15/15 0941  NA 137  --   K 3.8  --   CL 108  --   CO2 24  --   GLUCOSE 152*  --   BUN 19  --   CREATININE 0.74 0.84  CALCIUM 8.0*  --   AST 16  --   ALT 16*  --   ALKPHOS 62  --   BILITOT 1.0  --    ------------------------------------------------------------------------------------------------------------------  Cardiac Enzymes  Recent Labs Lab 10/15/15 0941  TROPONINI <0.03   ------------------------------------------------------------------------------------------------------------------  RADIOLOGY:  Dg Chest 2 View  10/14/2015  CLINICAL DATA:  Bilateral lower extremity edema for 2 or 3 months. Dyspnea. EXAM: CHEST  2 VIEW COMPARISON:  09/05/2015 FINDINGS: There is unchanged moderate cardiomegaly. There is vascular and interstitial prominence which is mildly worsened. No airspace consolidation. No effusions. Hilar and mediastinal contours are unremarkable and unchanged. IMPRESSION: Marked cardiomegaly with vascular and interstitial prominence, likely representing mild congestive heart failure. Electronically Signed   By: Ellery Plunkaniel R Mitchell M.D.   On: 10/14/2015 22:38    EKG:   Orders placed or performed during the hospital encounter of 10/15/15  . EKG 12-Lead  . EKG 12-Lead  . ED EKG  . ED EKG    ASSESSMENT AND PLAN:   50 year old male  patient with history of hypertension GERD sleep apnea and myocardial infarction presented to the emergency room with swelling of the  legs and dyspnea . Admitting diagnosis  1. Acute systolic congestive heart failure at the cardiomyopathy with ejection fraction 33-35% on recent echocardiogram Continue Lasix 40 mg IV every 12 hours Continue aspirin 81 mg, Coreg and spironolactone. Potassium supplements as needed Patient will be benefited with ACE inhibitor but patient is not on that. We will discuss with cardiology to see if there is a contraindication Daily intake and output and weight monitoring Patient is scheduled for cardiac catheterization tomorrow.  Appreciate  the dr. Rosine Beat recommendations  #. Hypertension  Continue home medication Coreg. Patient is on Lasix as well as spironolactone. Titrate medications as needed basis   #. GERD -Pepcid  # Tobacco abuse disorder Consultation to quit smoking for 3-5 minutes. Start him on nicotine patch. Nurses are reporting that patient is smoking in the room treatment plan  DVT prophylaxis with subcutaneous Lovenox 40 MG daily     All the records are reviewed and case discussed with Care Management/Social Workerr. Management plans discussed with the patient, family and they are in agreement.  CODE STATUS: fc   TOTAL TIME TAKING CARE OF THIS PATIENT: 35 minutes.   POSSIBLE D/C IN 2-3  DAYS, DEPENDING ON CLINICAL CONDITION.   Ramonita Lab M.D on 10/15/2015 at 12:16 PM  Between 7am to 6pm - Pager - [phone] After 6pm go to [email or web address] - password [email or web address] [facility]  [place]  [facility]  Office  [phone]  CC: Primary care physician; No PCP Per Patient

## 2015-10-16 ENCOUNTER — Encounter
Admission: EM | Disposition: A | Discharge status: Home / Self Care | Payer: Self-pay | Source: Home / Self Care | Attending: Internal Medicine

## 2015-10-16 ENCOUNTER — Encounter: Payer: Self-pay | Admitting: Cardiovascular Disease

## 2015-10-16 HISTORY — PX: CARDIAC CATHETERIZATION: SHX172

## 2015-10-16 LAB — BASIC METABOLIC PANEL
ANION GAP: 5 (ref 5–15)
BUN: 22 mg/dL — ABNORMAL HIGH (ref 6–20)
CHLORIDE: 106 mmol/L (ref 101–111)
CO2: 28 mmol/L (ref 22–32)
Calcium: 8.2 mg/dL — ABNORMAL LOW (ref 8.9–10.3)
Creatinine, Ser: 0.84 mg/dL (ref 0.61–1.24)
GFR calc non Af Amer: >60 mL/min (ref >60)
Glucose, Bld: 97 mg/dL (ref 65–99)
POTASSIUM: 3.5 mmol/L (ref 3.5–5.1)
SODIUM: 139 mmol/L (ref 135–145)

## 2015-10-16 LAB — CBC
HEMATOCRIT: 37.2 % — AB (ref 40.0–52.0)
HEMOGLOBIN: 12.2 g/dL — AB (ref 13.0–18.0)
MCH: 28.7 pg (ref 26.0–34.0)
MCHC: 32.7 g/dL (ref 32.0–36.0)
MCV: 87.9 fL (ref 80.0–100.0)
PLATELETS: 344 10*3/uL (ref 150–440)
RBC: 4.24 MIL/uL — AB (ref 4.40–5.90)
RDW: 16.6 % — ABNORMAL HIGH (ref 11.5–14.5)
WBC: 12.2 10*3/uL — AB (ref 3.8–10.6)

## 2015-10-16 SURGERY — LEFT HEART CATH AND CORONARY ANGIOGRAPHY
Anesthesia: Moderate Sedation | Laterality: Right

## 2015-10-16 MED ORDER — SPIRONOLACTONE 25 MG PO TABS: 25.0000 mg | ORAL_TABLET | Freq: Every day | ORAL | Status: DC

## 2015-10-16 MED ORDER — FUROSEMIDE 20 MG PO TABS: 20.0000 mg | ORAL_TABLET | Freq: Every day | ORAL | Status: DC

## 2015-10-16 MED ORDER — IOPAMIDOL (ISOVUE-300) INJECTION 61%
INTRAVENOUS | Status: DC | PRN
  Administered 2015-10-16: 130 mL via INTRA_ARTERIAL

## 2015-10-16 MED ORDER — ASPIRIN 81 MG PO TBEC: 81.0000 mg | DELAYED_RELEASE_TABLET | Freq: Every day | ORAL | Status: DC

## 2015-10-16 MED ORDER — ACETAMINOPHEN 325 MG PO TABS: 650.0000 mg | ORAL_TABLET | ORAL | Status: DC | PRN

## 2015-10-16 MED ORDER — LISINOPRIL 10 MG PO TABS
10.0000 mg | ORAL_TABLET | Freq: Every day | ORAL | Status: DC
Start: 2015-10-16 — End: 2015-11-28

## 2015-10-16 MED ORDER — MIDAZOLAM HCL 2 MG/2ML IJ SOLN
INTRAMUSCULAR | Status: DC | PRN
  Administered 2015-10-16: 0.5 mg via INTRAVENOUS
  Administered 2015-10-16: 1 mg via INTRAVENOUS
  Administered 2015-10-16: 0.5 mg via INTRAVENOUS

## 2015-10-16 MED ORDER — ONDANSETRON HCL 4 MG/2ML IJ SOLN: 4.0000 mg | Freq: Four times a day (QID) | INTRAMUSCULAR | Status: DC | PRN

## 2015-10-16 MED ORDER — MIDAZOLAM HCL 2 MG/2ML IJ SOLN
INTRAMUSCULAR | Status: AC
  Filled 2015-10-16: qty 2

## 2015-10-16 MED ORDER — SODIUM CHLORIDE 0.9% FLUSH: 3.0000 mL | Freq: Two times a day (BID) | INTRAVENOUS | Status: DC

## 2015-10-16 MED ORDER — SODIUM CHLORIDE 0.9% FLUSH: 3.0000 mL | INTRAVENOUS | Status: DC | PRN

## 2015-10-16 MED ORDER — HEPARIN (PORCINE) IN NACL 2-0.9 UNIT/ML-% IJ SOLN
INTRAMUSCULAR | Status: AC
  Filled 2015-10-16: qty 1000

## 2015-10-16 MED ORDER — SODIUM CHLORIDE 0.9 % WEIGHT BASED INFUSION: 1.0000 mL/kg/h | INTRAVENOUS | Status: DC

## 2015-10-16 MED ORDER — SACUBITRIL-VALSARTAN 24-26 MG PO TABS: 1.0000 | ORAL_TABLET | Freq: Two times a day (BID) | ORAL | Status: DC

## 2015-10-16 MED ORDER — NICOTINE 21 MG/24HR TD PT24: 21.0000 mg | MEDICATED_PATCH | Freq: Every day | TRANSDERMAL | Status: DC

## 2015-10-16 MED ORDER — SODIUM CHLORIDE 0.9 % IV SOLN: 250.0000 mL | INTRAVENOUS | Status: DC | PRN

## 2015-10-16 MED ORDER — OXYCODONE-ACETAMINOPHEN 5-325 MG PO TABS: 1.0000 | ORAL_TABLET | ORAL | Status: DC | PRN

## 2015-10-16 MED ORDER — FENTANYL CITRATE (PF) 100 MCG/2ML IJ SOLN
INTRAMUSCULAR | Status: AC
  Filled 2015-10-16: qty 2

## 2015-10-16 MED ORDER — FENTANYL CITRATE (PF) 100 MCG/2ML IJ SOLN
INTRAMUSCULAR | Status: DC | PRN
  Administered 2015-10-16 (×4): 25 ug via INTRAVENOUS

## 2015-10-16 MED ORDER — CARVEDILOL 6.25 MG PO TABS: 6.2500 mg | ORAL_TABLET | Freq: Two times a day (BID) | ORAL | Status: DC

## 2015-10-16 SURGICAL SUPPLY — 11 items
CATH INFINITI 5FR ANG PIGTAIL (CATHETERS) ×3 IMPLANT
CATH INFINITI 5FR JL4 (CATHETERS) ×3 IMPLANT
CATH INFINITI JR4 5F (CATHETERS) ×3 IMPLANT
DEVICE CLOSURE MYNXGRIP 5F (Vascular Products) ×3 IMPLANT
KIT MANI 3VAL PERCEP (MISCELLANEOUS) ×3 IMPLANT
NEEDLE PERC 18GX7CM (NEEDLE) ×3 IMPLANT
PACK CARDIAC CATH (CUSTOM PROCEDURE TRAY) ×3 IMPLANT
SHEATH AVANTI 5FR X 11CM (SHEATH) ×3 IMPLANT
SHEATH AVANTI 6FR X 11CM (SHEATH) ×3 IMPLANT
SHEATH PINNACLE 5F 10CM (SHEATH) ×3 IMPLANT
WIRE EMERALD 3MM-J .035X150CM (WIRE) ×6 IMPLANT

## 2015-10-16 NOTE — Progress Notes (Signed)
Patient seen for cpap setup. Patient states he doesn't have problems breathing unless he is laying on his back. He states he has a unit at home however i was unable to get response from him whether he uses at night or not. Patient was not ready to put mask on. Will followup

## 2015-10-16 NOTE — Progress Notes (Signed)
paptient returned from cardiac cath, remains alert and oreinted, vss, right femoral manual closure and left groin minx closure dry and intact, no active bleeding noted. PRN pain med administer per patient request. Will continue to monitor

## 2015-10-16 NOTE — Care Management Note (Signed)
Case Management Note  Patient Details  Name: Brian BailiffDavid M Porter MRN: [id number] Date of Birth: 11/05/1965  Subjective/Objective:      Downstairs for a heart cath. CM will see him after his return to 2-A.               Action/Plan:   Expected Discharge Date:                  Expected Discharge Plan:     In-House Referral:     Discharge planning Services     Post Acute Care Choice:    Choice offered to:     DME Arranged:    DME Agency:     HH Arranged:    HH Agency:     Status of Service:     Medicare Important Message Given:    Date Medicare IM Given:    Medicare IM give by:    Date Additional Medicare IM Given:    Additional Medicare Important Message give by:     If discussed at Long Length of Stay Meetings, dates discussed:    Additional Comments:  Cathi Hazan A, RN 10/16/2015, 9:48 AM

## 2015-10-16 NOTE — Progress Notes (Signed)
Report to simoun telemetry.  BILATERAL GROIN STICKS TO MONITOR.  Check right groin for bleeding or hematoma.  Patient will be on bedrest for 2 hours post sheath pull---out of bed at 11:30.  Bilateral pulses are 1's DP's..Marland Kitchen

## 2015-10-16 NOTE — Discharge Instructions (Signed)
Activity as tolerated Diet cardiac Daily weight monitoring and intake and output monitoring Outpatient follow-up with primary care physician in a week Outpatient follow-up with cardiology Dr. Welton FlakesKhan in a week

## 2015-10-16 NOTE — Progress Notes (Signed)
Patient did not want to wear cpap 

## 2015-10-16 NOTE — Discharge Summary (Signed)
[facility] [place] [facility]   PATIENT NAME: Brian Porter    MR#:  [id number]  DATE OF BIRTH:  10/09/1965  DATE OF ADMISSION:  10/15/2015 ADMITTING PHYSICIAN: Ihor Austin, MD  DATE OF DISCHARGE: 10/16/15 PRIMARY CARE PHYSICIAN: No PCP Per Patient    ADMISSION DIAGNOSIS:  Orthopnea [R06.01] Acute pulmonary edema (HCC) [J81.0] Peripheral edema [R60.9] Elevated brain natriuretic peptide (BNP) level [R79.9]  DISCHARGE DIAGNOSIS:  Acute on chronic systolic congestive heart failure with cardiomyopathy Tobacco abuse  SECONDARY DIAGNOSIS:   Past Medical History  Diagnosis Date  . Hypertension   . MI, old   . CAD (coronary artery disease)   . Sleep apnea   . GERD (gastroesophageal reflux disease)     HOSPITAL COURSE:   50 year old male patient with history of hypertension GERD sleep apnea and myocardial infarction presented to the emergency room with swelling of the legs and dyspnea . Admitting diagnosis  1. Acute systolic congestive heart failure with  cardiomyopathy with ejection fraction 33-35% on recent echocardiogram Status post cardiac catheterization, recommending medical management at this time. Appreciate cardiology recommendations Continue Lasix 20 mg and spironolactone. Discontinued potassium supplements as patient is on spironolactone and lisinopril Continue aspirin 81 mg, Coreg and spironolactonE Patient will be benefited with ACE inhibitor, patient is started on lisinopril 10 mg once daily Daily intake and output and weight monitoring Outpatient follow-up with cardiology in 1 week is recommended     #. Hypertension  Continue home medication Coreg. Patient is on Lasix as well as spironolactone. Titrate medications as needed basis   #. GERD -Pepcid  # Tobacco abuse disorder Consultation to quit smoking for 3-5 minutes. Start him on nicotine patch. Nurses are reporting that patient is smoking in the room treatment plan  DVT  prophylaxis with subcutaneous Lovenox 40 MG daily  DISCHARGE CONDITIONS:   Fair   CONSULTS OBTAINED:  Treatment Team:  Laurier Nancy, MD   PROCEDURES cardiac catheterIzation 4/17  DRUG ALLERGIES:  No Known Allergies  DISCHARGE MEDICATIONS:   Current Discharge Medication List    START taking these medications   Details  acetaminophen (TYLENOL) 325 MG tablet Take 2 tablets (650 mg total) by mouth every 4 (four) hours as needed for headache or mild pain.    aspirin EC 81 MG EC tablet Take 1 tablet (81 mg total) by mouth daily.    carvedilol (COREG) 6.25 MG tablet Take 1 tablet (6.25 mg total) by mouth 2 (two) times daily with a meal. Qty: 60 tablet, Refills: 0    furosemide (LASIX) 20 MG tablet Take 1 tablet (20 mg total) by mouth daily. Qty: 30 tablet, Refills: 0    lisinopril (ZESTRIL) 10 MG tablet Take 1 tablet (10 mg total) by mouth daily. Qty: 30 tablet, Refills: 0    nicotine (NICODERM CQ - DOSED IN MG/24 HOURS) 21 mg/24hr patch Place 1 patch (21 mg total) onto the skin daily. Qty: 28 patch, Refills: 0    spironolactone (ALDACTONE) 25 MG tablet Take 1 tablet (25 mg total) by mouth daily. Qty: 30 tablet, Refills: 0      CONTINUE these medications which have CHANGED   Details  oxyCODONE-acetaminophen (PERCOCET/ROXICET) 5-325 MG tablet Take 1-2 tablets by mouth every 4 (four) hours as needed for moderate pain. Qty: 30 tablet, Refills: 0      CONTINUE these medications which have NOT CHANGED   Details  albuterol (PROVENTIL HFA) 108 (90 Base) MCG/ACT inhaler Inhale 2 puffs into the lungs  every 4 (four) hours as needed for wheezing or shortness of breath. Qty: 1 Inhaler, Refills: 0    cyclobenzaprine (FLEXERIL) 10 MG tablet Take 1 tablet (10 mg total) by mouth every 8 (eight) hours as needed for muscle spasms. Qty: 30 tablet, Refills: 1    naproxen (NAPROSYN) 500 MG tablet Take 1 tablet (500 mg total) by mouth 2 (two) times daily with a meal. Qty: 20 tablet,  Refills: 0    ranitidine (ZANTAC) 150 MG tablet Take 150 mg by mouth 2 (two) times daily as needed for heartburn.    traMADol (ULTRAM) 50 MG tablet Take 1 tablet (50 mg total) by mouth every 6 (six) hours as needed. Qty: 20 tablet, Refills: 0    diclofenac (VOLTAREN) 75 MG EC tablet Take 1 tablet (75 mg total) by mouth 2 (two) times daily. Qty: 60 tablet, Refills: 0    predniSONE (DELTASONE) 20 MG tablet Take 2 tablets (40 mg total) by mouth daily. Qty: 8 tablet, Refills: 0      STOP taking these medications     azithromycin (ZITHROMAX Z-PAK) 250 MG tablet          DISCHARGE INSTRUCTIONS:  Activity as tolerated Diet cardiac Daily weight monitoring and intake and output monitoring Outpatient follow-up with primary care physician in a week Outpatient follow-up with cardiology Dr. Welton FlakesKhan in a week    DIET:  Cardiac diet  DISCHARGE CONDITION:  Fair  ACTIVITY:  Activity as tolerated  OXYGEN:  Home Oxygen: No.   Oxygen Delivery: room air  DISCHARGE LOCATION:  home   If you experience worsening of your admission symptoms, develop shortness of breath, life threatening emergency, suicidal or homicidal thoughts you must seek medical attention immediately by calling 911 or calling your MD immediately  if symptoms less severe.  You Must read complete instructions/literature along with all the possible adverse reactions/side effects for all the Medicines you take and that have been prescribed to you. Take any new Medicines after you have completely understood and accpet all the possible adverse reactions/side effects.   Please note  You were cared for by a hospitalist during your hospital stay. If you have any questions about your discharge medications or the care you received while you were in the hospital after you are discharged, you can call the unit and asked to speak with the hospitalist on call if the hospitalist that took care of you is not available. Once you are  discharged, your primary care physician will handle any further medical issues. Please note that NO REFILLS for any discharge medications will be authorized once you are discharged, as it is imperative that you return to your primary care physician (or establish a relationship with a primary care physician if you do not have one) for your aftercare needs so that they can reassess your need for medications and monitor your lab values.     Today  Chief Complaint  Patient presents with  . Leg Swelling   Patient is feeling much better. Had cardiac catheterization today. Tolerating diet after procedure. Denies any chest pain or shortness of breath. Wants to go home  ROS:  CONSTITUTIONAL: Denies fevers, chills. Denies any fatigue, weakness.  EYES: Denies blurry vision, double vision, eye pain. EARS, NOSE, THROAT: Denies tinnitus, ear pain, hearing loss. RESPIRATORY: Denies cough, wheeze, shortness of breath.  CARDIOVASCULAR: Denies chest pain, palpitations, edema.  GASTROINTESTINAL: Denies nausea, vomiting, diarrhea, abdominal pain. Denies bright red blood per rectum. GENITOURINARY: Denies dysuria, hematuria. ENDOCRINE: Denies  nocturia or thyroid problems. HEMATOLOGIC AND LYMPHATIC: Denies easy bruising or bleeding. SKIN: Denies rash or lesion. MUSCULOSKELETAL: Denies pain in neck, back, shoulder, knees, hips or arthritic symptoms.  NEUROLOGIC: Denies paralysis, paresthesias.  PSYCHIATRIC: Denies anxiety or depressive symptoms.   VITAL SIGNS:  Blood pressure 123/84, pulse 93, temperature 97.6 F (36.4 C), temperature source Oral, resp. rate 18, height  (1.778 m), weight 103.964 kg (229 lb 3.2 oz), SpO2 94 %.  I/O:    Intake/Output Summary (Last 24 hours) at 10/16/15 1340 Last data filed at 10/16/15 1105  Gross per 24 hour  Intake    240 ml  Output   1050 ml  Net   -810 ml    PHYSICAL EXAMINATION:  GENERAL:  50 y.o.-year-old patient lying in the bed with no acute distress.   EYES: Pupils equal, round, reactive to light and accommodation. No scleral icterus. Extraocular muscles intact.  HEENT: Head atraumatic, normocephalic. Oropharynx and nasopharynx clear.  NECK:  Supple, no jugular venous distention. No thyroid enlargement, no tenderness.  LUNGS: Moderate breath sounds bilaterally, no wheezing, rales,rhonchi or crepitation. No use of accessory muscles of respiration.  CARDIOVASCULAR: S1, S2 normal. No murmurs, rubs, or gallops.  ABDOMEN: Soft, non-tender, non-distended. Bowel sounds present. No organomegaly or mass.  EXTREMITIES: No pedal edema, cyanosis, or clubbing.  NEUROLOGIC: Cranial nerves II through XII are intact. Muscle strength 5/5 in all extremities. Sensation intact. Gait not checked.  PSYCHIATRIC: The patient is alert and oriented x 3.  SKIN: No obvious rash, lesion, or ulcer.   DATA REVIEW:   CBC  Recent Labs Lab 10/16/15 0425  WBC 12.2*  HGB 12.2*  HCT 37.2*  PLT 344    Chemistries   Recent Labs Lab 10/14/15 2055  10/16/15 0425  NA 137  --  139  K 3.8  --  3.5  CL 108  --  106  CO2 24  --  28  GLUCOSE 152*  --  97  BUN 19  --  22*  CREATININE 0.74  < > 0.84  CALCIUM 8.0*  --  8.2*  AST 16  --   --   ALT 16*  --   --   ALKPHOS 62  --   --   BILITOT 1.0  --   --   < > = values in this interval not displayed.  Cardiac Enzymes  Recent Labs Lab 10/15/15 1944  TROPONINI <0.03    Microbiology Results  No results found for this or any previous visit.  RADIOLOGY:  Dg Chest 2 View  10/14/2015  CLINICAL DATA:  Bilateral lower extremity edema for 2 or 3 months. Dyspnea. EXAM: CHEST  2 VIEW COMPARISON:  09/05/2015 FINDINGS: There is unchanged moderate cardiomegaly. There is vascular and interstitial prominence which is mildly worsened. No airspace consolidation. No effusions. Hilar and mediastinal contours are unremarkable and unchanged. IMPRESSION: Marked cardiomegaly with vascular and interstitial prominence, likely  representing mild congestive heart failure. Electronically Signed   By: Ellery Plunk M.D.   On: 10/14/2015 22:38    EKG:   Orders placed or performed during the hospital encounter of 10/15/15  . EKG 12-Lead  . EKG 12-Lead  . ED EKG  . ED EKG      Management plans discussed with the patient, HE IS in agreement. More than 50% time was spent on face-to-face counseling, education and coordination of care  CODE STATUS:     Code Status Orders        Start  Ordered   10/15/15 0751  Full code   Continuous     10/15/15 0750    Code Status History    Date Active Date Inactive Code Status Order ID Comments User Context   This patient has a current code status but no historical code status.      TOTAL TIME TAKING CARE OF THIS PATIENT: 45 minutes.    @  on 10/16/2015 at 1:40 PM  Between 7am to 6pm - Pager - [phone]  After 6pm go to [email or web address] - password [email or web address] [facility]  [facility]  Office  [phone]  CC: Primary care physician; No PCP Per Patient

## 2015-10-16 NOTE — Progress Notes (Signed)
Patient discharge home to self care, discharged instruction provided, iv removed, tele removed. Post cath education handout provided, patient discharged home at this time

## 2015-10-16 NOTE — Progress Notes (Signed)
Bedside report received, patient resting in the bed, at this time, no distress noted, patient npo for cardiac cath this am

## 2015-10-16 NOTE — Progress Notes (Signed)
SUBJECTIVE: No chest pain   Filed Vitals:   10/15/15 0746 10/15/15 1129 10/15/15 2013 10/16/15 0431  BP: 116/82 106/80 109/67 119/82  Pulse: 107 96 88 88  Temp: 97.8 F (36.6 C) 97.6 F (36.4 C) 97.8 F (36.6 C) 98.3 F (36.8 C)  TempSrc: Oral Oral Oral Oral  Resp: 16 16 16 16   Height: 5\' 10"  (1.778 m)     Weight: 231 lb (104.781 kg)   229 lb 3.2 oz (103.964 kg)  SpO2: 95% 98% 97% 100%    Intake/Output Summary (Last 24 hours) at 10/16/15 0828 Last data filed at 10/16/15 0443  Gross per 24 hour  Intake    240 ml  Output   1200 ml  Net   -960 ml    LABS: Basic Metabolic Panel:  Recent Labs  56/21/3004/15/17 2055 10/15/15 0941 10/16/15 0425  NA 137  --  139  K 3.8  --  3.5  CL 108  --  106  CO2 24  --  28  GLUCOSE 152*  --  97  BUN 19  --  22*  CREATININE 0.74 0.84 0.84  CALCIUM 8.0*  --  8.2*   Liver Function Tests:  Recent Labs  10/14/15 2055  AST 16  ALT 16*  ALKPHOS 62  BILITOT 1.0  PROT 7.3  ALBUMIN 3.3*   No results for input(s): LIPASE, AMYLASE in the last 72 hours. CBC:  Recent Labs  10/15/15 0941 10/16/15 0425  WBC 14.7* 12.2*  HGB 12.4* 12.2*  HCT 37.4* 37.2*  MCV 86.4 87.9  PLT 347 344   Cardiac Enzymes:  Recent Labs  10/15/15 0941 10/15/15 1341 10/15/15 1944  TROPONINI <0.03 <0.03 <0.03   BNP: Invalid input(s): POCBNP D-Dimer: No results for input(s): DDIMER in the last 72 hours. Hemoglobin A1C: No results for input(s): HGBA1C in the last 72 hours. Fasting Lipid Panel: No results for input(s): CHOL, HDL, LDLCALC, TRIG, CHOLHDL, LDLDIRECT in the last 72 hours. Thyroid Function Tests: No results for input(s): TSH, T4TOTAL, T3FREE, THYROIDAB in the last 72 hours.  Invalid input(s): FREET3 Anemia Panel: No results for input(s): VITAMINB12, FOLATE, FERRITIN, TIBC, IRON, RETICCTPCT in the last 72 hours.   PHYSICAL EXAM General: Well developed, well nourished, in no acute distress HEENT:  Normocephalic and atramatic Neck:  No  JVD.  Lungs: Clear bilaterally to auscultation and percussion. Heart: HRRR . Normal S1 and S2 without gallops or murmurs.  Abdomen: Bowel sounds are positive, abdomen soft and non-tender  Msk:  Back normal, normal gait. Normal strength and tone for age. Extremities: No clubbing, cyanosis or edema.   Neuro: Alert and oriented X 3. Psych:  Good affect, responds appropriately  TELEMETRY:Sinus rhythm  ASSESSMENT AND PLAN: Cardiomyopathy with left ventricle ejection fraction 35% and history of coronary artery disease and stenting. His new onset CHF thus will have to assess the status of coronaries by doing catheterization.  Principal Problem:   CHF (congestive heart failure) (HCC)    Brian Porter A, MD, [facility] 10/16/2015 8:28 AM

## 2015-10-16 NOTE — Care Management Note (Signed)
Case Management Note  Patient Details  Name: Dahlia BailiffDavid M Acre MRN: [id number] Date of Birth: 05/08/1966  Subjective/Objective:      [facility] and instructions to take the voucher with his prescriptions to a pharmacy on the list attached to the voucher. [facility] requested a CPAP machine but he is uninsured and has no PCP. This Clinical research associatewriter provided him with a list of local clinics who accept uninsured patients and instructed him to make an appointment at one of these clinics where he can get a PCP.               Action/Plan:   Expected Discharge Date:                  Expected Discharge Plan:     In-House Referral:     Discharge planning Services     Post Acute Care Choice:    Choice offered to:     DME Arranged:    DME Agency:     HH Arranged:    HH Agency:     Status of Service:     Medicare Important Message Given:    Date Medicare IM Given:    Medicare IM give by:    Date Additional Medicare IM Given:    Additional Medicare Important Message give by:     If discussed at Long Length of Stay Meetings, dates discussed:    Additional Comments:  Davionte Lusby A, RN 10/16/2015, 2:18 PM

## 2015-10-16 NOTE — Progress Notes (Addendum)
A&O. Independent. Pain meds given through the night. IV lasix given.  Cardiac cath for today. Groin shaved and instructed to get washed up before procedure.

## 2015-10-16 NOTE — Care Management Note (Signed)
Case Management Note  Patient Details  Name: Dahlia BailiffDavid M Stowers MRN: [id number] Date of Birth: 07/07/1965  Subjective/Objective:          Discharge to home with no home health services order after cardiac cath today.           Action/Plan:   Expected Discharge Date:                  Expected Discharge Plan:     In-House Referral:     Discharge planning Services     Post Acute Care Choice:    Choice offered to:     DME Arranged:    DME Agency:     HH Arranged:    HH Agency:     Status of Service:     Medicare Important Message Given:    Date Medicare IM Given:    Medicare IM give by:    Date Additional Medicare IM Given:    Additional Medicare Important Message give by:     If discussed at Long Length of Stay Meetings, dates discussed:    Additional Comments:  Delrick Dehart A, RN 10/16/2015, 11:26 AM

## 2015-10-21 ENCOUNTER — Encounter: Payer: Self-pay | Admitting: Emergency Medicine

## 2015-10-21 ENCOUNTER — Emergency Department
Admission: EM | Admit: 2015-10-21 | Discharge: 2015-10-21 | Disposition: A | Discharge status: Home / Self Care | Payer: Self-pay | Attending: Emergency Medicine | Admitting: Emergency Medicine

## 2015-10-21 ENCOUNTER — Emergency Department: Payer: Self-pay

## 2015-10-21 DIAGNOSIS — R600 Localized edema: Secondary | ICD-10-CM | POA: Insufficient documentation

## 2015-10-21 DIAGNOSIS — I5022 Chronic systolic (congestive) heart failure: Secondary | ICD-10-CM | POA: Insufficient documentation

## 2015-10-21 DIAGNOSIS — Z79899 Other long term (current) drug therapy: Secondary | ICD-10-CM | POA: Insufficient documentation

## 2015-10-21 DIAGNOSIS — I110 Hypertensive heart disease with heart failure: Secondary | ICD-10-CM | POA: Insufficient documentation

## 2015-10-21 DIAGNOSIS — I2510 Atherosclerotic heart disease of native coronary artery without angina pectoris: Secondary | ICD-10-CM | POA: Insufficient documentation

## 2015-10-21 DIAGNOSIS — F17210 Nicotine dependence, cigarettes, uncomplicated: Secondary | ICD-10-CM | POA: Insufficient documentation

## 2015-10-21 DIAGNOSIS — I252 Old myocardial infarction: Secondary | ICD-10-CM | POA: Insufficient documentation

## 2015-10-21 DIAGNOSIS — Z7982 Long term (current) use of aspirin: Secondary | ICD-10-CM | POA: Insufficient documentation

## 2015-10-21 LAB — BASIC METABOLIC PANEL
ANION GAP: 6 (ref 5–15)
BUN: 21 mg/dL — AB (ref 6–20)
CHLORIDE: 108 mmol/L (ref 101–111)
CO2: 27 mmol/L (ref 22–32)
Calcium: 8.6 mg/dL — ABNORMAL LOW (ref 8.9–10.3)
Creatinine, Ser: 0.8 mg/dL (ref 0.61–1.24)
GFR calc Af Amer: >60 mL/min (ref >60)
GLUCOSE: 102 mg/dL — AB (ref 65–99)
POTASSIUM: 4.6 mmol/L (ref 3.5–5.1)
Sodium: 141 mmol/L (ref 135–145)

## 2015-10-21 LAB — CBC
HEMATOCRIT: 37.4 % — AB (ref 40.0–52.0)
HEMOGLOBIN: 12.2 g/dL — AB (ref 13.0–18.0)
MCH: 28.4 pg (ref 26.0–34.0)
MCHC: 32.7 g/dL (ref 32.0–36.0)
MCV: 86.9 fL (ref 80.0–100.0)
Platelets: 335 10*3/uL (ref 150–440)
RBC: 4.31 MIL/uL — ABNORMAL LOW (ref 4.40–5.90)
RDW: 16 % — AB (ref 11.5–14.5)
WBC: 11.1 10*3/uL — ABNORMAL HIGH (ref 3.8–10.6)

## 2015-10-21 LAB — TROPONIN I

## 2015-10-21 IMAGING — CR DG CHEST 2V
2 series · 2 of 2 positions shown · non-contrast
Comparison: [DATE]

CLINICAL DATA: CHF, lower extremity swelling

EXAM:
CHEST  2 VIEW

[chest pa]
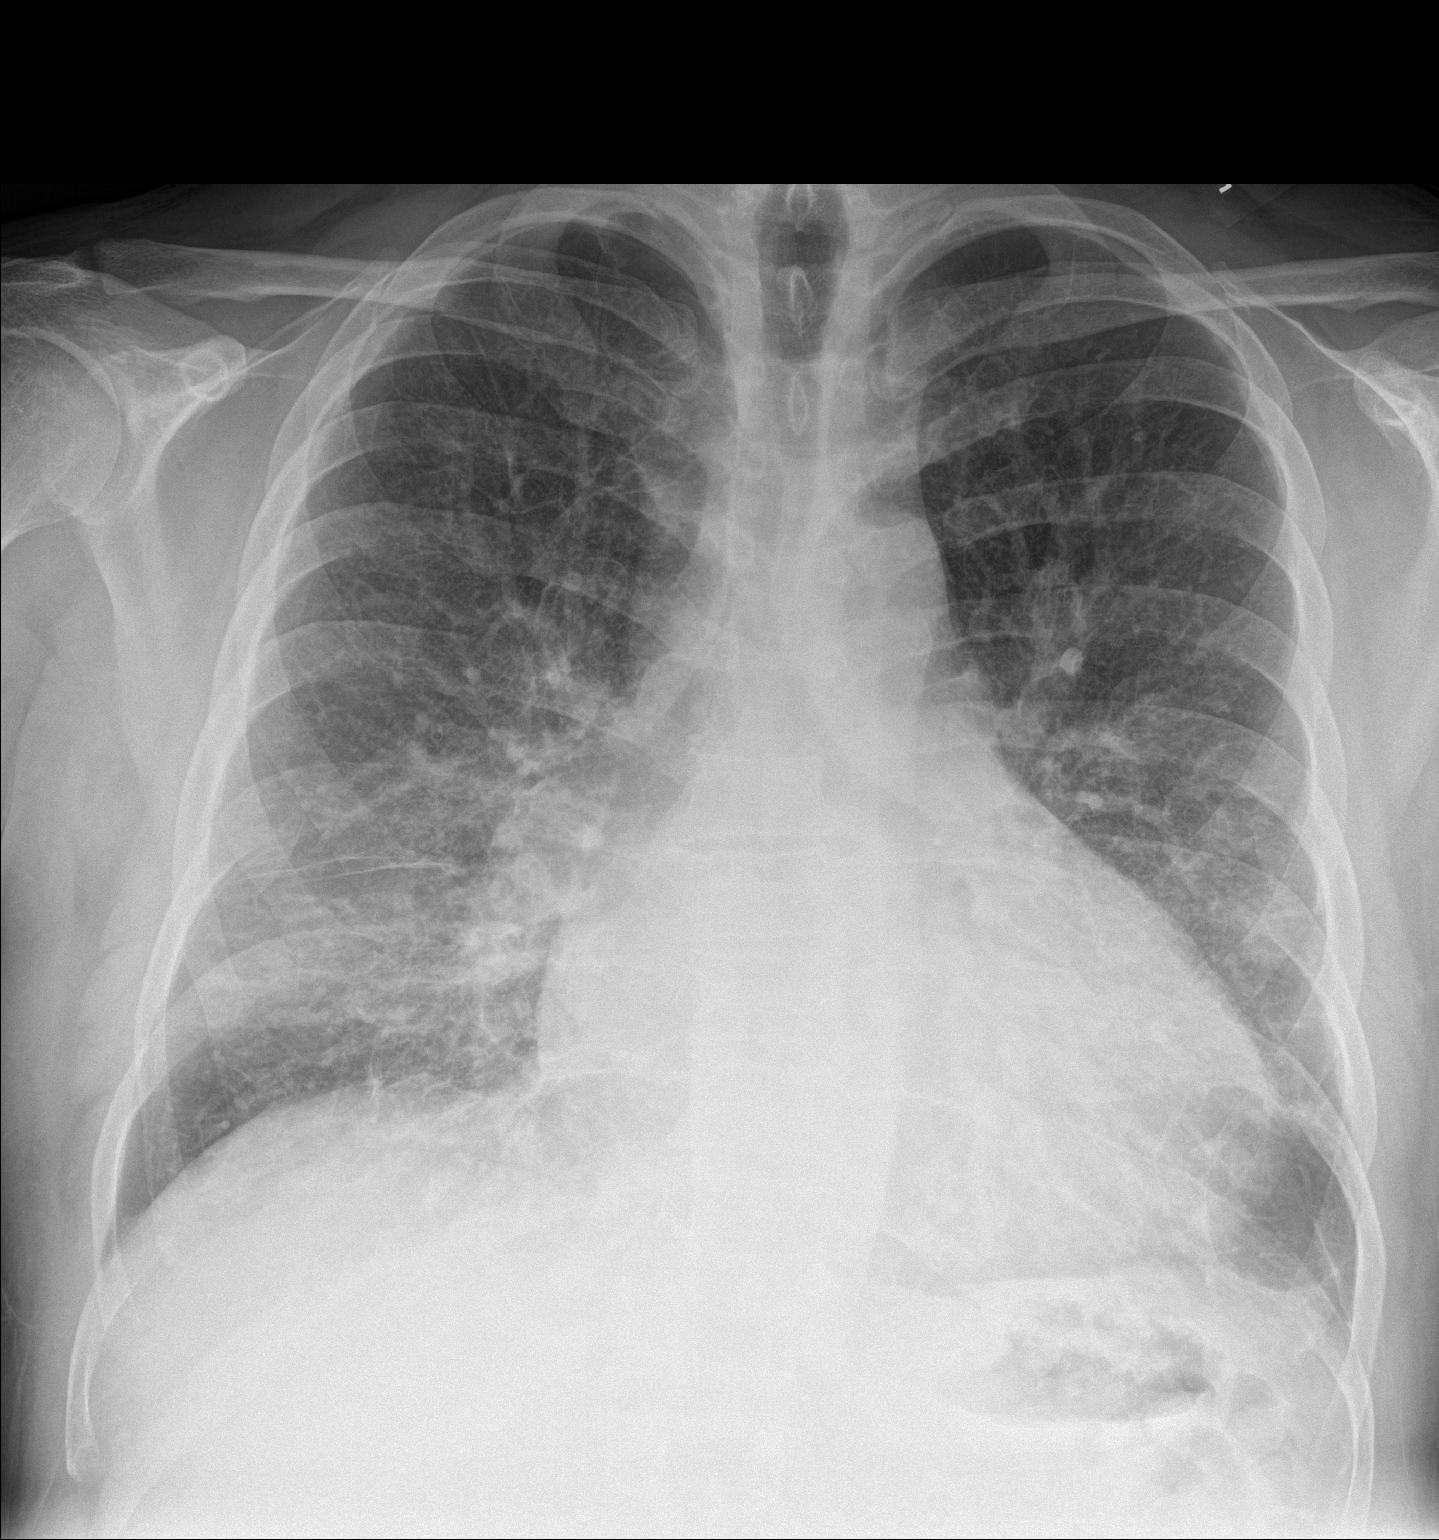

[chest lat]
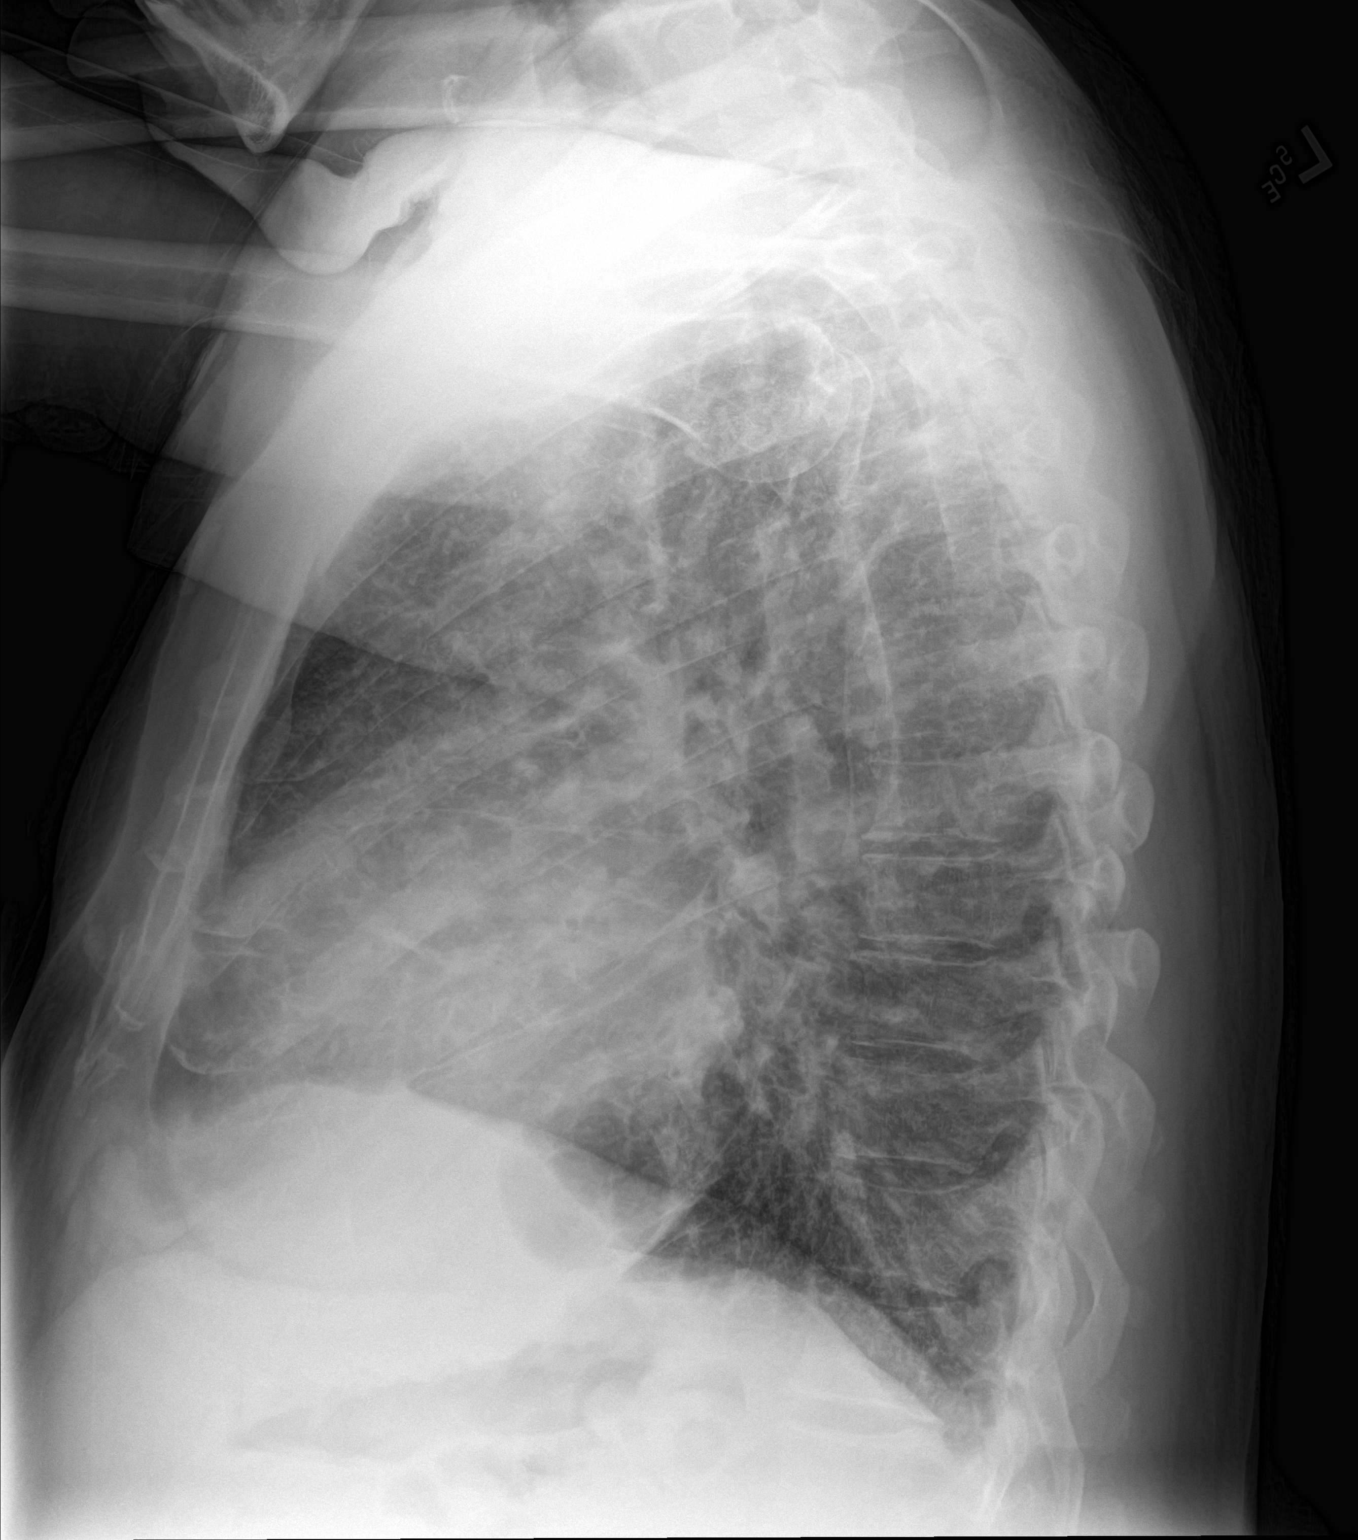

[2 of 2 positions shown; findings below may reference images not displayed]

FINDINGS: Cardiomegaly with pulmonary vascular congestion. No frank
interstitial edema. No pleural effusion or pneumothorax.

Visualized osseous structures are within normal limits.
IMPRESSION: Cardiomegaly pulmonary vascular congestion. No frank interstitial
edema.

## 2015-10-21 MED ORDER — OXYCODONE-ACETAMINOPHEN 5-325 MG PO TABS
1.0000 | ORAL_TABLET | Freq: Once | ORAL | Status: AC
  Administered 2015-10-21: 1 via ORAL
  Filled 2015-10-21: qty 1

## 2015-10-21 MED ORDER — FUROSEMIDE 20 MG PO TABS: 20.0000 mg | ORAL_TABLET | Freq: Every day | ORAL | Status: DC

## 2015-10-21 MED ORDER — KETOROLAC TROMETHAMINE 30 MG/ML IJ SOLN
30.0000 mg | Freq: Once | INTRAMUSCULAR | Status: AC
  Administered 2015-10-21: 30 mg via INTRAVENOUS
  Filled 2015-10-21: qty 1

## 2015-10-21 MED ORDER — OXYCODONE-ACETAMINOPHEN 5-325 MG PO TABS: 1.0000 | ORAL_TABLET | Freq: Four times a day (QID) | ORAL | Status: DC | PRN

## 2015-10-21 MED ORDER — FUROSEMIDE 10 MG/ML IJ SOLN
40.0000 mg | Freq: Once | INTRAMUSCULAR | Status: AC
Start: 2015-10-21 — End: 2015-10-21
  Administered 2015-10-21: 40 mg via INTRAVENOUS
  Filled 2015-10-21: qty 4

## 2015-10-21 NOTE — ED Provider Notes (Signed)
[facility] Emergency Department Provider Note  ____________________________________________  Time seen: 6:30 PM  I have reviewed the triage vital signs and the nursing notes.   HISTORY  Chief Complaint Leg Swelling and Shortness of Breath    HPI Brian Porter is a 50 y.o. male who complains of gradual onset shortness of breath over the last 5 days. He is recently discharged from the hospital where he was treated for new onset congestive heart failure. He was discharged with prescriptions for Percocet Lasix and spironolactone. He filled the Percocet prescription but not the Lasix or spironolactone. He is run out of the Percocet and requests more Percocet. Denies orthopnea PND or chest pain. No cough or fever.     Past Medical History  Diagnosis Date  . Hypertension   . MI, old   . CAD (coronary artery disease)   . Sleep apnea   . GERD (gastroesophageal reflux disease)      Patient Active Problem List   Diagnosis Date Noted  . CHF (congestive heart failure) (HCC) 10/15/2015  . Chest pain 02/22/2015  . Chest pain at rest 02/22/2015     Past Surgical History  Procedure Laterality Date  . Splenectomy    . Partial nephrectomy Left   . Coronary angioplasty with stent placement  approx 2 years ago  . Cardiac catheterization Right 10/16/2015    Procedure: Left Heart Cath and Coronary Angiography;  Surgeon: Laurier Nancy, MD;  Location: [facility] INVASIVE CV LAB;  Service: Cardiovascular;  Laterality: Right;     Current Outpatient Rx  Name  Route  Sig  Dispense  Refill  . albuterol (PROVENTIL HFA) 108 (90 Base) MCG/ACT inhaler   Inhalation   Inhale 2 puffs into the lungs every 4 (four) hours as needed for wheezing or shortness of breath.   1 Inhaler   0   . aspirin EC 81 MG EC tablet   Oral   Take 1 tablet (81 mg total) by mouth daily.         . carvedilol (COREG) 6.25 MG tablet   Oral   Take 1 tablet (6.25 mg total) by mouth 2 (two) times  daily with a meal.   60 tablet   0   . cyclobenzaprine (FLEXERIL) 10 MG tablet   Oral   Take 1 tablet (10 mg total) by mouth every 8 (eight) hours as needed for muscle spasms.   30 tablet   1   . lisinopril (ZESTRIL) 10 MG tablet   Oral   Take 1 tablet (10 mg total) by mouth daily.   30 tablet   0   . naproxen (NAPROSYN) 500 MG tablet   Oral   Take 1 tablet (500 mg total) by mouth 2 (two) times daily with a meal.   20 tablet   0   . ranitidine (ZANTAC) 150 MG tablet   Oral   Take 150 mg by mouth 2 (two) times daily as needed for heartburn.         Marland Kitchen acetaminophen (TYLENOL) 325 MG tablet   Oral   Take 2 tablets (650 mg total) by mouth every 4 (four) hours as needed for headache or mild pain.         Marland Kitchen diclofenac (VOLTAREN) 75 MG EC tablet   Oral   Take 1 tablet (75 mg total) by mouth 2 (two) times daily. Patient not taking: Reported on 08/03/2015   60 tablet   0   . furosemide (LASIX) 20  MG tablet   Oral   Take 1 tablet (20 mg total) by mouth daily.   30 tablet   0   . nicotine (NICODERM CQ - DOSED IN MG/24 HOURS) 21 mg/24hr patch   Transdermal   Place 1 patch (21 mg total) onto the skin daily.   28 patch   0   . oxyCODONE-acetaminophen (ROXICET) 5-325 MG tablet   Oral   Take 1 tablet by mouth every 6 (six) hours as needed for severe pain.   3 tablet   0   . predniSONE (DELTASONE) 20 MG tablet   Oral   Take 2 tablets (40 mg total) by mouth daily. Patient not taking: Reported on 10/15/2015   8 tablet   0   . spironolactone (ALDACTONE) 25 MG tablet   Oral   Take 1 tablet (25 mg total) by mouth daily.   30 tablet   0   . traMADol (ULTRAM) 50 MG tablet   Oral   Take 1 tablet (50 mg total) by mouth every 6 (six) hours as needed.   20 tablet   0      Allergies Review of patient's allergies indicates no known allergies.   Family History  Problem Relation Age of Onset  . Diabetes Mellitus II Brother   . Heart failure Mother   . Hypertension  Mother     Social History Social History  Substance Use Topics  . Smoking status: Current Every Day Smoker -- 1.00 packs/day    Types: Cigarettes  . Smokeless tobacco: None  . Alcohol Use: No    Review of Systems  Constitutional:   No fever or chills.  Eyes:   No vision changes.  ENT:   No sore throat. No rhinorrhea. Cardiovascular:   No chest pain. Respiratory:   Positive shortness of breath without cough. Gastrointestinal:   Negative for abdominal pain, vomiting and diarrhea.  No bloody stool. Genitourinary:   Negative for dysuria or difficulty urinating. Musculoskeletal:   Negative for focal pain or swelling Neurological:   Negative for headaches 10-point ROS otherwise negative.  ____________________________________________   PHYSICAL EXAM:  VITAL SIGNS: ED Triage Vitals  Enc Vitals Group     BP 10/21/15 1729 112/82 mmHg     Pulse Rate 10/21/15 1729 99     Resp 10/21/15 1729 20     Temp 10/21/15 1729 98.1 F (36.7 C)     Temp Source 10/21/15 1729 Oral     SpO2 10/21/15 1729 95 %     Weight 10/21/15 1729 239 lb (108.41 kg)     Height 10/21/15 1729 5\' 10"  (1.778 m)     Head Cir --      Peak Flow --      Pain Score 10/21/15 1730 6     Pain Loc --      Pain Edu? --      Excl. in GC? --     Vital signs reviewed, nursing assessments reviewed.   Constitutional:   Alert and oriented. Well appearing and in no distress. Eyes:   No scleral icterus. No conjunctival pallor. PERRL. EOMI ENT   Head:   Normocephalic and atraumatic.   Nose:   No congestion/rhinnorhea. No septal hematoma   Mouth/Throat:   MMM, no pharyngeal erythema. No peritonsillar mass.    Neck:   No stridor. No SubQ emphysema. No meningismus.No JVD Hematological/Lymphatic/Immunilogical:   No cervical lymphadenopathy. Cardiovascular:   RRR. Symmetric bilateral radial and DP pulses.  No murmurs.  Respiratory:   Normal respiratory effort without tachypnea nor retractions. Slight bibasilar  rales. No wheezing. Gastrointestinal:   Soft and nontender. Non distended. There is no CVA tenderness.  No rebound, rigidity, or guarding. Genitourinary:   deferred Musculoskeletal:   Nontender with normal range of motion in all extremities. No joint effusions.  No lower extremity tenderness.  1+ pitting edema bilaterally, symmetric. Negative homan, no palpable cord. Neurologic:   Normal speech and language.  CN 2-10 normal. Motor grossly intact. No gross focal neurologic deficits are appreciated.  Skin:    Skin is warm, dry and intact. No rash noted.  No petechiae, purpura, or bullae.  ____________________________________________    LABS (pertinent positives/negatives) (all labs ordered are listed, but only abnormal results are displayed) Labs Reviewed  BASIC METABOLIC PANEL - Abnormal; Notable for the following:    Glucose, Bld 102 (*)    BUN 21 (*)    Calcium 8.6 (*)    All other components within normal limits  CBC - Abnormal; Notable for the following:    WBC 11.1 (*)    RBC 4.31 (*)    Hemoglobin 12.2 (*)    HCT 37.4 (*)    RDW 16.0 (*)    All other components within normal limits  TROPONIN I   ____________________________________________   EKG  Interpreted by me Normal sinus rhythm rate of 97, left axis, normal intervals. Poor R-wave progression in anterior leads. Normal ST segments and T waves.  ____________________________________________    RADIOLOGY  Chest x-ray shows cardiomegaly and some pulmonary vascular congestion. No pulmonary edema. No airspace disease. No pneumothorax.  ____________________________________________   PROCEDURES   ____________________________________________   INITIAL IMPRESSION / ASSESSMENT AND PLAN / ED COURSE  Pertinent labs & imaging results that were available during my care of the patient were reviewed by me and considered in my medical decision making (see chart for details).  Patient well appearing no acute  distress. Likely has persistence and gradual worsening of his chronic systolic heart failure due to medication noncompliance. There also appears to be a drug-seeking element to the presentation as it seems he is intentionally disregarded his diuretics while taking 30 Percocet tablets over the last 5 days. His main concern today is getting more Percocet and IV pain meds. Labs and chest x-ray do not show any severe findings. Vital signs are normal including room air oxygen saturation. Patient is given IV Lasix, I'll write him a new prescription for his Lasix tablets to be sure he has it. I'll provide him a very limited Percocet supply of 3 tablets. Patient strongly encouraged to follow closely with cardiology.     ____________________________________________   FINAL CLINICAL IMPRESSION(S) / ED DIAGNOSES  Final diagnoses:  Chronic systolic congestive heart failure (HCC)  Peripheral edema     Portions of this note were generated with dragon dictation software. Dictation errors may occur despite best attempts at proofreading.   Sharman Cheek, MD 10/21/15 2017

## 2015-10-21 NOTE — ED Notes (Signed)
MD informed pt would like 2 percocet for pain; acknowledged

## 2015-10-21 NOTE — ED Notes (Signed)
Answering pt's call bell; requesting food and 2 percocet; says toradol given previously only relieved pain small amount

## 2015-10-21 NOTE — ED Notes (Signed)
Pt was recently discharged on Monday pt bilateral lower extremity edema. Had a cardiac cath placed about 5 days ago. Endorses SOB no chest pain.

## 2015-10-21 NOTE — Discharge Instructions (Signed)

## 2015-10-27 ENCOUNTER — Ambulatory Visit: Payer: Self-pay | Admitting: Family

## 2015-11-01 ENCOUNTER — Encounter: Payer: Self-pay | Admitting: Family

## 2015-11-01 ENCOUNTER — Ambulatory Visit: Payer: Self-pay | Attending: Family | Admitting: Family

## 2015-11-01 VITALS — BP 109/68 | HR 90 | Resp 18 | Ht 70.0 in | Wt 227.0 lb

## 2015-11-01 DIAGNOSIS — M25542 Pain in joints of left hand: Secondary | ICD-10-CM

## 2015-11-01 DIAGNOSIS — Z9081 Acquired absence of spleen: Secondary | ICD-10-CM | POA: Insufficient documentation

## 2015-11-01 DIAGNOSIS — G4730 Sleep apnea, unspecified: Secondary | ICD-10-CM | POA: Insufficient documentation

## 2015-11-01 DIAGNOSIS — Z720 Tobacco use: Secondary | ICD-10-CM

## 2015-11-01 DIAGNOSIS — R0602 Shortness of breath: Secondary | ICD-10-CM | POA: Insufficient documentation

## 2015-11-01 DIAGNOSIS — Z905 Acquired absence of kidney: Secondary | ICD-10-CM | POA: Insufficient documentation

## 2015-11-01 DIAGNOSIS — I2510 Atherosclerotic heart disease of native coronary artery without angina pectoris: Secondary | ICD-10-CM | POA: Insufficient documentation

## 2015-11-01 DIAGNOSIS — F17210 Nicotine dependence, cigarettes, uncomplicated: Secondary | ICD-10-CM | POA: Insufficient documentation

## 2015-11-01 DIAGNOSIS — Z79899 Other long term (current) drug therapy: Secondary | ICD-10-CM | POA: Insufficient documentation

## 2015-11-01 DIAGNOSIS — I10 Essential (primary) hypertension: Secondary | ICD-10-CM

## 2015-11-01 DIAGNOSIS — M25541 Pain in joints of right hand: Secondary | ICD-10-CM

## 2015-11-01 DIAGNOSIS — I110 Hypertensive heart disease with heart failure: Secondary | ICD-10-CM | POA: Insufficient documentation

## 2015-11-01 DIAGNOSIS — Z7982 Long term (current) use of aspirin: Secondary | ICD-10-CM | POA: Insufficient documentation

## 2015-11-01 DIAGNOSIS — Z9889 Other specified postprocedural states: Secondary | ICD-10-CM | POA: Insufficient documentation

## 2015-11-01 DIAGNOSIS — I5022 Chronic systolic (congestive) heart failure: Secondary | ICD-10-CM | POA: Insufficient documentation

## 2015-11-01 DIAGNOSIS — I252 Old myocardial infarction: Secondary | ICD-10-CM | POA: Insufficient documentation

## 2015-11-01 DIAGNOSIS — K219 Gastro-esophageal reflux disease without esophagitis: Secondary | ICD-10-CM | POA: Insufficient documentation

## 2015-11-01 DIAGNOSIS — M79641 Pain in right hand: Secondary | ICD-10-CM | POA: Insufficient documentation

## 2015-11-01 NOTE — Patient Instructions (Addendum)
[facility] is a FREE clinic in [place] that provides medical care for uninsured patients. Please follow up with them as soon as possible, there is an application that needs to be filled out. To complete this application you must go to the clinic in person Thursdays 1:00-7:30 PM or Tuesdays 4:30-7:30 PM.  Items needed: Proof of residency in [facility] [place]  - this can be a notarized letter from who you currently live with as well as a Chief Executive Officerutility bill Financial income - Is the person your living with supporting you? -Are you collecting disabilty?     If so you need a letter stating your roommate supports you or your approval of disabilty You will also need to supply bank statements (past 30 days) or 2016 Taxs  And provide a [place] drivers license   Appointment turn around is about one week.   Contact Medication Management to schedule an appointment, information provided.    ** Increase fluid pill by taking 2 fluid pills (furosemide) daily.

## 2015-11-01 NOTE — Progress Notes (Signed)
Subjective:    Patient ID: Brian Bailiffavid M Porter, male    DOB: 02/07/1966, 50 y.o.   MRN: [id number]  Congestive Heart Failure Presents for initial visit. The disease course has been stable. Associated symptoms include edema, fatigue, orthopnea and shortness of breath. Pertinent negatives include no abdominal pain, chest pain, chest pressure, muscle weakness or palpitations. The symptoms have been stable. Past treatments include ACE inhibitors, aldosterone receptor blockers, beta blockers and salt and fluid restriction. The treatment provided moderate relief. Compliance with prior treatments has been variable. Prior compliance problems include medication issues (cost). His past medical history is significant for CAD and HTN. There is no history of CVA or DM. He has one 1st degree relative with heart disease. Compliance with total regimen is 51-75%. Compliance problems include medication cost.  Compliance with medications is 51-75%.  Hypertension This is a chronic problem. The current episode started more than 1 year ago. The problem is unchanged. The problem is controlled. Associated symptoms include peripheral edema and shortness of breath. Pertinent negatives include no chest pain, headaches, neck pain or palpitations. There are no associated agents to hypertension. Risk factors for coronary artery disease include family history, male gender and smoking/tobacco exposure. Past treatments include ACE inhibitors, beta blockers, diuretics and lifestyle changes. The current treatment provides significant improvement. Compliance problems include medication cost.  Hypertensive end-organ damage includes CAD/MI and heart failure. There is no history of kidney disease or CVA.  Other This is a new (hand pain) problem. The current episode started 1 to 4 weeks ago. The problem occurs constantly. The problem has been unchanged. Associated symptoms include arthralgias (hands, knees and ankles), coughing, fatigue and joint  swelling (hands). Pertinent negatives include no abdominal pain, chest pain, congestion, headaches, neck pain, numbness, sore throat or weakness. Nothing aggravates the symptoms. He has tried oral narcotics for the symptoms. The treatment provided mild relief.   Past Medical History  Diagnosis Date  . Hypertension   . MI, old   . CAD (coronary artery disease)   . Sleep apnea   . GERD (gastroesophageal reflux disease)     Past Surgical History  Procedure Laterality Date  . Splenectomy    . Partial nephrectomy Left   . Coronary angioplasty with stent placement  approx 2 years ago  . Cardiac catheterization Right 10/16/2015    Procedure: Left Heart Cath and Coronary Angiography;  Surgeon: Laurier NancyShaukat A Khan, MD;  Location: [facility] INVASIVE CV LAB;  Service: Cardiovascular;  Laterality: Right;    Family History  Problem Relation Age of Onset  . Diabetes Mellitus II Brother   . Heart failure Mother   . Hypertension Mother     Social History  Substance Use Topics  . Smoking status: Current Every Day Smoker -- 1.00 packs/day    Types: Cigarettes  . Smokeless tobacco: Never Used  . Alcohol Use: No    No Known Allergies  Prior to Admission medications   Medication Sig Start Date End Date Taking? Authorizing Provider  acetaminophen (TYLENOL) 325 MG tablet Take 2 tablets (650 mg total) by mouth every 4 (four) hours as needed for headache or mild pain. 10/16/15  Yes Ramonita LabAruna Gouru, MD  albuterol (PROVENTIL HFA) 108 (90 Base) MCG/ACT inhaler Inhale 2 puffs into the lungs every 4 (four) hours as needed for wheezing or shortness of breath. 09/05/15  Yes Sharman CheekPhillip Stafford, MD  aspirin EC 81 MG EC tablet Take 1 tablet (81 mg total) by mouth daily. 10/16/15  Yes Ramonita LabAruna Gouru, MD  carvedilol (COREG) 6.25 MG tablet Take 1 tablet (6.25 mg total) by mouth 2 (two) times daily with a meal. 10/16/15  Yes Ramonita Lab, MD  cyclobenzaprine (FLEXERIL) 10 MG tablet Take 1 tablet (10 mg total) by mouth every 8 (eight)  hours as needed for muscle spasms. 07/31/15  Yes Charmayne Sheer Beers, PA-C  diclofenac (VOLTAREN) 75 MG EC tablet Take 1 tablet (75 mg total) by mouth 2 (two) times daily. 07/31/15  Yes Charmayne Sheer Beers, PA-C  furosemide (LASIX) 20 MG tablet Take 1 tablet (20 mg total) by mouth daily. 10/21/15 10/20/16 Yes Sharman Cheek, MD  lisinopril (ZESTRIL) 10 MG tablet Take 1 tablet (10 mg total) by mouth daily. 10/16/15  Yes Ramonita Lab, MD  naproxen (NAPROSYN) 500 MG tablet Take 1 tablet (500 mg total) by mouth 2 (two) times daily with a meal. 09/05/15  Yes Sharman Cheek, MD  predniSONE (DELTASONE) 20 MG tablet Take 2 tablets (40 mg total) by mouth daily. 09/05/15  Yes Sharman Cheek, MD  ranitidine (ZANTAC) 150 MG tablet Take 150 mg by mouth 2 (two) times daily as needed for heartburn.   Yes Historical Provider, MD  spironolactone (ALDACTONE) 25 MG tablet Take 1 tablet (25 mg total) by mouth daily. 10/16/15  Yes Ramonita Lab, MD  traMADol (ULTRAM) 50 MG tablet Take 1 tablet (50 mg total) by mouth every 6 (six) hours as needed. 08/06/15  Yes Jennye Moccasin, MD      Review of Systems  Constitutional: Positive for fatigue. Negative for appetite change.  HENT: Negative for congestion, postnasal drip and sore throat.   Eyes: Negative.   Respiratory: Positive for cough and shortness of breath. Negative for chest tightness and wheezing.   Cardiovascular: Positive for leg swelling. Negative for chest pain and palpitations.  Gastrointestinal: Negative for abdominal pain and abdominal distention.  Endocrine: Negative.   Genitourinary: Negative.   Musculoskeletal: Positive for back pain (lower back), joint swelling (hands) and arthralgias (hands, knees and ankles). Negative for muscle weakness and neck pain.  Skin: Negative.   Allergic/Immunologic: Negative.   Neurological: Positive for light-headedness (intermittently). Negative for dizziness, weakness, numbness and headaches.  Hematological: Negative for adenopathy.  Does not bruise/bleed easily.  Psychiatric/Behavioral: Positive for sleep disturbance (sleeping on 2 pillows) and dysphoric mood. Negative for suicidal ideas. The patient is not nervous/anxious.        Objective:   Physical Exam  Constitutional: He is oriented to person, place, and time. He appears well-developed and well-nourished.  HENT:  Head: Normocephalic and atraumatic.  Eyes: Conjunctivae are normal. Pupils are equal, round, and reactive to light.  Neck: Normal range of motion. Neck supple.  Cardiovascular: Normal rate and regular rhythm.   Pulmonary/Chest: Effort normal. He has no wheezes. He has no rales.  Abdominal: Soft. He exhibits no distension. There is no tenderness.  Musculoskeletal: He exhibits edema (trace amount of pitting edema in bilateral lower legs) and tenderness.       Right wrist: He exhibits tenderness and swelling.       Left wrist: He exhibits tenderness and swelling.       Right hand: He exhibits swelling.       Left hand: He exhibits swelling.  Neurological: He is alert and oriented to person, place, and time.  Skin: Skin is warm and dry.  Psychiatric: He has a normal mood and affect. His behavior is normal. Thought content normal.  Nursing note and vitals reviewed.   BP 109/68 mmHg  Pulse 90  Resp 18  Ht 5\' 10"  (1.778 m)  Wt 227 lb (102.967 kg)  BMI 32.57 kg/m2  SpO2 97%       Assessment & Plan:  1: Chronic heart failure with reduced ejection fraction- Patient presents with fatigue and shortness of breath upon exertion. He says that he did not experience symptoms upon walking into the office today. He reports that his swelling is beginning to return in his lower legs and says that he's taking his furosemide as 20mg  once daily. Instructed patient to increase it to 40mg  daily ( 2 tablets). Continues on spironolactone. Will check a basic metabolic panel at his next visit. He says that he has scales at home and that he's already weighing himself  daily. Instructed him to call for an overnight weight gain of >2 pounds or a weekly weight gain of >5 pounds. He says that he's adding "a little" salt to his food but is trying to decrease usage. He admits that financially it's difficult to afford the fresh fruits/vegs. Discussed the importance of closely following a 2000mg  sodium diet and written information was given to him about this. Would like to change his lisinopril to entresto. Information given to him about [facility] for assistance with his medications. He says that he's seen his cardiologist since discharge.  2: HTN- Blood pressure looks good today. He says that he has no insurance so doesn't have a PCP. Information on the [facility] was given to him and it was stressed that he needs to call them to get an appointment. 3: Arthralgia of both hands- Patient complains of swelling/pain in both hands. His hands are a little swollen with the L>R. Denies a history of gout. Patient repeatedly asks for pain medication to help with this pain and other joint pain. Informed patient that if the hand pain is due to the swelling that the pain should decrease since the diuretic is being increased. Also informed patient that any pain medication would need to come from his PCP as we don't prescribe that in this office.  4: Tobacco use- Patient continues to smoke about 1/2 ppd of cigarettes and is working on quitting. Complete cessation was encouraged and discussed for 3 minutes with him.  Medication list was verbally reviewed with the patient and he confirms that he's taking everything on the list.   Return here in 1 month or sooner for any questions/problems before then.

## 2015-11-02 DIAGNOSIS — I10 Essential (primary) hypertension: Secondary | ICD-10-CM | POA: Insufficient documentation

## 2015-11-02 DIAGNOSIS — Z720 Tobacco use: Secondary | ICD-10-CM | POA: Insufficient documentation

## 2015-11-02 DIAGNOSIS — M25549 Pain in joints of unspecified hand: Secondary | ICD-10-CM | POA: Insufficient documentation

## 2015-11-02 HISTORY — DX: Tobacco use: Z72.0

## 2015-11-28 ENCOUNTER — Other Ambulatory Visit: Payer: Self-pay | Admitting: Family

## 2015-11-28 MED ORDER — LISINOPRIL 10 MG PO TABS: 10.0000 mg | ORAL_TABLET | Freq: Every day | ORAL | Status: DC

## 2015-11-28 MED ORDER — FUROSEMIDE 20 MG PO TABS: 20.0000 mg | ORAL_TABLET | Freq: Every day | ORAL | Status: DC

## 2015-11-28 MED ORDER — SPIRONOLACTONE 25 MG PO TABS: 25.0000 mg | ORAL_TABLET | Freq: Every day | ORAL | Status: DC

## 2015-11-29 ENCOUNTER — Telehealth: Payer: Self-pay | Admitting: Family

## 2015-11-29 ENCOUNTER — Ambulatory Visit: Payer: Self-pay | Admitting: Family

## 2015-11-29 NOTE — Telephone Encounter (Signed)
Patient did not show for his [facility] appointment on 11/29/15. Will attempt to reschedule.

## 2016-01-12 ENCOUNTER — Encounter: Payer: Self-pay | Admitting: Emergency Medicine

## 2016-01-12 ENCOUNTER — Emergency Department
Admission: EM | Admit: 2016-01-12 | Discharge: 2016-01-12 | Disposition: A | Discharge status: Home / Self Care | Payer: Self-pay | Attending: Emergency Medicine | Admitting: Emergency Medicine

## 2016-01-12 DIAGNOSIS — M7021 Olecranon bursitis, right elbow: Secondary | ICD-10-CM

## 2016-01-12 DIAGNOSIS — Z79899 Other long term (current) drug therapy: Secondary | ICD-10-CM | POA: Insufficient documentation

## 2016-01-12 DIAGNOSIS — F17210 Nicotine dependence, cigarettes, uncomplicated: Secondary | ICD-10-CM | POA: Insufficient documentation

## 2016-01-12 DIAGNOSIS — M7022 Olecranon bursitis, left elbow: Secondary | ICD-10-CM

## 2016-01-12 DIAGNOSIS — L03114 Cellulitis of left upper limb: Secondary | ICD-10-CM | POA: Insufficient documentation

## 2016-01-12 DIAGNOSIS — I252 Old myocardial infarction: Secondary | ICD-10-CM | POA: Insufficient documentation

## 2016-01-12 DIAGNOSIS — M7031 Other bursitis of elbow, right elbow: Secondary | ICD-10-CM | POA: Insufficient documentation

## 2016-01-12 DIAGNOSIS — M7032 Other bursitis of elbow, left elbow: Secondary | ICD-10-CM | POA: Insufficient documentation

## 2016-01-12 DIAGNOSIS — I10 Essential (primary) hypertension: Secondary | ICD-10-CM | POA: Insufficient documentation

## 2016-01-12 DIAGNOSIS — Z7982 Long term (current) use of aspirin: Secondary | ICD-10-CM | POA: Insufficient documentation

## 2016-01-12 DIAGNOSIS — I2510 Atherosclerotic heart disease of native coronary artery without angina pectoris: Secondary | ICD-10-CM | POA: Insufficient documentation

## 2016-01-12 DIAGNOSIS — Y939 Activity, unspecified: Secondary | ICD-10-CM | POA: Insufficient documentation

## 2016-01-12 MED ORDER — PENTAFLUOROPROP-TETRAFLUOROETH EX AERO
INHALATION_SPRAY | Freq: Once | CUTANEOUS | Status: DC
Start: 2016-01-12 — End: 2016-01-12
  Filled 2016-01-12: qty 30

## 2016-01-12 MED ORDER — MELOXICAM 15 MG PO TABS: 15.0000 mg | ORAL_TABLET | Freq: Every day | ORAL | Status: DC

## 2016-01-12 MED ORDER — CEPHALEXIN 500 MG PO CAPS: 500.0000 mg | ORAL_CAPSULE | Freq: Four times a day (QID) | ORAL | Status: DC

## 2016-01-12 NOTE — Discharge Instructions (Signed)
Elbow Bursitis Elbow bursitis is inflammation of the fluid-filled sac (bursa) between the tip of your elbow bone (olecranon) and your skin. Elbow bursitis may also be called olecranon bursitis. Normally, the olecranon bursa has only a small amount of fluid in it to cushion and protect your elbow bone. Elbow bursitis causes fluid to build up inside the bursa. Over time, this swelling and inflammation can cause pain when you bend or lean on your elbow.  CAUSES Elbow bursitis may be caused by:   Elbow injury (acute trauma).  Leaning on hard surfaces for long periods of time.  Infection from an injury that breaks the skin near your elbow.  A bone growth (spur) that forms at the tip of your elbow.  A medical condition that causes inflammation in your body, such as gout or rheumatoid arthritis.  The cause may also be unknown.  SIGNS AND SYMPTOMS  The first sign of elbow bursitis is usually swelling over the tip of your elbow. This can grow to be the size of a golf ball. This may start suddenly or develop gradually. You may also have:  Pain when bending or leaning on your elbow.  Restricted movement of your elbow.  If your bursitis is caused by an infection, symptoms may also include:  Redness, warmth, and tenderness of the elbow.  Drainage of pus from the swollen area over your elbow, if the skin breaks open. DIAGNOSIS  Your health care provider may be able to diagnose elbow bursitis based on your signs and symptoms, especially if you have recently been injured. Your health care provider will also do a physical exam. This may include:  X-rays to look for a bone spur or a bone fracture.  Draining fluid from the bursa to test it for infection.  Blood tests to rule out gout or rheumatoid arthritis. TREATMENT  Treatment for elbow bursitis depends on the cause. Treatment may include:  Medicines. These may include:  Over-the-counter medicines to relieve pain and  inflammation.  Antibiotic medicines to fight infection.  Injections of anti-inflammatory medicines (steroids).  Wrapping your elbow with a bandage.  Draining fluid from the bursa.  Wearing elbow pads.  If your bursitis does not get better with treatment, surgery may be needed to remove the bursa.  HOME CARE INSTRUCTIONS   Take medicines only as directed by your health care provider.  If you were prescribed an antibiotic medicine, finish all of it even if you start to feel better.  If your bursitis is caused by an injury, rest your elbow and wear your bandage as directed by your health care provider. You may alsoapply ice to the injured area as directed by your health care provider:  Put ice in a plastic bag.  Place a towel between your skin and the bag.  Leave the ice on for 20 minutes, 2-3 times per day.  Avoid any activities that cause elbow pain.  Use elbow pads or elbow wraps to cushion your elbow. SEEK MEDICAL CARE IF:  You have a fever.   Your symptoms do not get better with treatment.  Your pain or swelling gets worse.  Your elbow pain or swelling goes away and then returns.  You have drainage of pus from the swollen area over your elbow.   This information is not intended to replace advice given to you by your health care provider. Make sure you discuss any questions you have with your health care provider.   Document Released: 07/17/2006 Document Revised: 07/08/2014 Document  Reviewed: 02/23/2014 Elsevier Interactive Patient Education 2016 Elsevier Inc.  Cellulitis Cellulitis is an infection of the skin and the tissue beneath it. The infected area is usually red and tender. Cellulitis occurs most often in the arms and lower legs.  CAUSES  Cellulitis is caused by bacteria that enter the skin through cracks or cuts in the skin. The most common types of bacteria that cause cellulitis are staphylococci and streptococci. SIGNS AND SYMPTOMS   Redness and  warmth.  Swelling.  Tenderness or pain.  Fever. DIAGNOSIS  Your health care provider can usually determine what is wrong based on a physical exam. Blood tests may also be done. TREATMENT  Treatment usually involves taking an antibiotic medicine. HOME CARE INSTRUCTIONS   Take your antibiotic medicine as directed by your health care provider. Finish the antibiotic even if you start to feel better.  Keep the infected arm or leg elevated to reduce swelling.  Apply a warm cloth to the affected area up to 4 times per day to relieve pain.  Take medicines only as directed by your health care provider.  Keep all follow-up visits as directed by your health care provider. SEEK MEDICAL CARE IF:   You notice red streaks coming from the infected area.  Your red area gets larger or turns dark in color.  Your bone or joint underneath the infected area becomes painful after the skin has healed.  Your infection returns in the same area or another area.  You notice a swollen bump in the infected area.  You develop new symptoms.  You have a fever. SEEK IMMEDIATE MEDICAL CARE IF:   You feel very sleepy.  You develop vomiting or diarrhea.  You have a general ill feeling (malaise) with muscle aches and pains.   This information is not intended to replace advice given to you by your health care provider. Make sure you discuss any questions you have with your health care provider.   Document Released: 03/27/2005 Document Revised: 03/08/2015 Document Reviewed: 09/02/2011 Elsevier Interactive Patient Education [employer] Else[employer] Inc.

## 2016-01-12 NOTE — ED Notes (Signed)
Patient reports over last few weeks has had swelling to both elbows. Denies any history of gout. Denies any redness or warmth to areas. Patient's left elbow more swollen than right.

## 2016-01-12 NOTE — ED Provider Notes (Signed)
[facility] Emergency Department Provider Note  ____________________________________________  Time seen: Approximately 7:11 PM  I have reviewed the triage vital signs and the nursing notes.   HISTORY  Chief Complaint Joint Swelling    HPI Brian Porter is a 50 y.o. male who presents to emergency department complaining of bilateral elbow pain and swelling. Patient denies any injury to the abrasions. Patient denies having a job that requires him to be on elbows on time. Patient's wife states that he does lean against a bar with bilateral elbows quite often. Patient denies any redness or warmth to the areas. Patient denies any fevers or chills. He denies any other joint complaint. Patient has not had a history of this in the past. No history of gout.   Past Medical History  Diagnosis Date  . Hypertension   . MI, old   . CAD (coronary artery disease)   . Sleep apnea   . GERD (gastroesophageal reflux disease)     Patient Active Problem List   Diagnosis Date Noted  . HTN (hypertension) 11/02/2015  . Tobacco abuse 11/02/2015  . Hand joint pain 11/02/2015  . CHF (congestive heart failure) (HCC) 10/15/2015  . Chest pain 02/22/2015  . Chest pain at rest 02/22/2015    Past Surgical History  Procedure Laterality Date  . Splenectomy    . Partial nephrectomy Left   . Coronary angioplasty with stent placement  approx 2 years ago  . Cardiac catheterization Right 10/16/2015    Procedure: Left Heart Cath and Coronary Angiography;  Surgeon: Laurier Nancy, MD;  Location: [facility] INVASIVE CV LAB;  Service: Cardiovascular;  Laterality: Right;    Current Outpatient Rx  Name  Route  Sig  Dispense  Refill  . acetaminophen (TYLENOL) 325 MG tablet   Oral   Take 2 tablets (650 mg total) by mouth every 4 (four) hours as needed for headache or mild pain.         Marland Kitchen albuterol (PROVENTIL HFA) 108 (90 Base) MCG/ACT inhaler   Inhalation   Inhale 2 puffs into the lungs  every 4 (four) hours as needed for wheezing or shortness of breath.   1 Inhaler   0   . aspirin EC 81 MG EC tablet   Oral   Take 1 tablet (81 mg total) by mouth daily.         . carvedilol (COREG) 6.25 MG tablet   Oral   Take 1 tablet (6.25 mg total) by mouth 2 (two) times daily with a meal.   60 tablet   0   . cephALEXin (KEFLEX) 500 MG capsule   Oral   Take 1 capsule (500 mg total) by mouth 4 (four) times daily.   28 capsule   0   . cyclobenzaprine (FLEXERIL) 10 MG tablet   Oral   Take 1 tablet (10 mg total) by mouth every 8 (eight) hours as needed for muscle spasms.   30 tablet   1   . diclofenac (VOLTAREN) 75 MG EC tablet   Oral   Take 1 tablet (75 mg total) by mouth 2 (two) times daily.   60 tablet   0   . furosemide (LASIX) 20 MG tablet   Oral   Take 1 tablet (20 mg total) by mouth daily.   30 tablet   3   . lisinopril (ZESTRIL) 10 MG tablet   Oral   Take 1 tablet (10 mg total) by mouth daily.   30 tablet  0   . meloxicam (MOBIC) 15 MG tablet   Oral   Take 1 tablet (15 mg total) by mouth daily.   30 tablet   0   . naproxen (NAPROSYN) 500 MG tablet   Oral   Take 1 tablet (500 mg total) by mouth 2 (two) times daily with a meal.   20 tablet   0   . predniSONE (DELTASONE) 20 MG tablet   Oral   Take 2 tablets (40 mg total) by mouth daily.   8 tablet   0   . ranitidine (ZANTAC) 150 MG tablet   Oral   Take 150 mg by mouth 2 (two) times daily as needed for heartburn.         . spironolactone (ALDACTONE) 25 MG tablet   Oral   Take 1 tablet (25 mg total) by mouth daily.   30 tablet   3   . traMADol (ULTRAM) 50 MG tablet   Oral   Take 1 tablet (50 mg total) by mouth every 6 (six) hours as needed.   20 tablet   0     Allergies Review of patient's allergies indicates no known allergies.  Family History  Problem Relation Age of Onset  . Diabetes Mellitus II Brother   . Heart failure Mother   . Hypertension Mother     Social  History Social History  Substance Use Topics  . Smoking status: Current Every Day Smoker -- 1.00 packs/day    Types: Cigarettes  . Smokeless tobacco: Never Used  . Alcohol Use: No     Review of Systems  Constitutional: No fever/chills Cardiovascular: no chest pain. Respiratory: no cough. No SOB. Gastrointestinal: No abdominal pain.  No nausea, no vomiting.   Musculoskeletal: Positive for bilateral elbow pain and swelling Skin: Negative for rash, abrasions, lacerations, ecchymosis. Neurological: Negative for headaches, focal weakness or numbness. 10-point ROS otherwise negative.  ____________________________________________   PHYSICAL EXAM:  VITAL SIGNS: ED Triage Vitals  Enc Vitals Group     BP 01/12/16 1754 127/76 mmHg     Pulse Rate 01/12/16 1754 84     Resp 01/12/16 1754 18     Temp 01/12/16 1754 97.6 F (36.4 C)     Temp Source 01/12/16 1754 Oral     SpO2 01/12/16 1754 96 %     Weight 01/12/16 1754 210 lb (95.255 kg)     Height 01/12/16 1754 5\' 9"  (1.753 m)     Head Cir --      Peak Flow --      Pain Score 01/12/16 1754 5     Pain Loc --      Pain Edu? --      Excl. in GC? --      Constitutional: Alert and oriented. Well appearing and in no acute distress. Eyes: Conjunctivae are normal. PERRL. EOMI. Head: Atraumatic. Cardiovascular: Normal rate, regular rhythm. Normal S1 and S2.  Good peripheral circulation. Respiratory: Normal respiratory effort without tachypnea or retractions. Lungs CTAB. Good air entry to the bases with no decreased or absent breath sounds. Musculoskeletal: Full range of motion to all extremities. No gross deformities appreciated.Patient has definitive edema noted to the posterior aspect of bilateral LS. Positive ballottement bilaterally. No warmth or erythema noted to region. Full range of motion of bilateral elbows. Neurologic:  Normal speech and language. No gross focal neurologic deficits are appreciated.  Skin:  Skin is warm, dry and  intact. No rash noted. Small erythematous and edematous skin lesion  noted to the left posterior forearm midway between elbow and wrist. Area is mildly tender to palpation. Area is firm to palpation. No fluctuance noted. No drainage noted. Psychiatric: Mood and affect are normal. Speech and behavior are normal. Patient exhibits appropriate insight and judgement.   ____________________________________________   LABS (all labs ordered are listed, but only abnormal results are displayed)  Labs Reviewed - No data to display ____________________________________________  EKG   ____________________________________________  RADIOLOGY   No results found.  ____________________________________________    PROCEDURES  Procedure(s) performed:    Apiration of blood/fluid Performed by: Racheal Patches Consent obtained. Required items: required blood products, implants, devices, and special equipment available Patient identity confirmed: verbally with patient Time out: Immediately prior to procedure a "time out" was called to verify the correct patient, procedure, equipment, support staff and site/side marked as required. Preparation: Patient was prepped  usual sterile fashion. Patient tolerance: Patient tolerated the procedure well with no immediate complications.  Location of aspiration: R elbow.   Procedure: Area was topically anesthetized using Gebauer's Pain-Ease spray. Area was cleansed prior to insertion of the needle. 22-gauge 1-1/2 inch needle is inserted into the joint space of the right elbow and 12 ML's of fluid is aspirated. Patient tolerated procedure well.     Apiration of blood/fluid Performed by: Racheal Patches Consent obtained. Required items: required blood products, implants, devices, and special equipment available Patient identity confirmed: verbally with patient Time out: Immediately prior to procedure a "time out" was called to verify the correct  patient, procedure, equipment, support staff and site/side marked as required. Preparation: Patient was prepped and draped in the usual sterile fashion. Patient tolerance: Patient tolerated the procedure well with no immediate complications.  Location of aspiration: L elbow. Area was anesthetized using topical Gebauer's spray. Area was cleansed prior to insertion of needle. 22-gauge 1-1/2 inch needle was inserted into the joint space of the left elbow. 14 ML's of fluid is aspirated. Patient tolerated procedure well.         Medications  pentafluoroprop-tetrafluoroeth (GEBAUERS) aerosol (not administered)     ____________________________________________   INITIAL IMPRESSION / ASSESSMENT AND PLAN / ED COURSE  Pertinent labs & imaging results that were available during my care of the patient were reviewed by me and considered in my medical decision making (see chart for details).  Patient's diagnosis is consistent with bilateral elbow bursitis. Both elbows had ballottement and were aspirated emergency department. No indications of septic arthritis with erythema, warmth, or systemic complications. Patient did not originally complaining of cellulitis but this was found on examination. This is on the left forearm and is not close to the joint space. No indication for incision and drainage.. Patient will be discharged home with prescriptions for anti-inflammatories for bursitis and antibiotics for cellulitis. Patient is to follow up with orthopedics as needed or otherwise directed. Patient is given ED precautions to return to the ED for any worsening or new symptoms.     ____________________________________________  FINAL CLINICAL IMPRESSION(S) / ED DIAGNOSES  Final diagnoses:  Bursitis of elbow, left  Bursitis of elbow, right  Cellulitis of left upper extremity      NEW MEDICATIONS STARTED DURING THIS VISIT:  New Prescriptions   CEPHALEXIN (KEFLEX) 500 MG CAPSULE    Take 1  capsule (500 mg total) by mouth 4 (four) times daily.   MELOXICAM (MOBIC) 15 MG TABLET    Take 1 tablet (15 mg total) by mouth daily.  This chart was dictated using voice recognition software/Dragon. Despite best efforts to proofread, errors can occur which can change the meaning. Any change was purely unintentional.    Racheal PatchesJonathan D Cuthriell, PA-C 01/12/16 2008  Governor Rooksebecca Lord, MD 01/12/16 2156

## 2016-01-13 ENCOUNTER — Emergency Department
Admission: EM | Admit: 2016-01-13 | Discharge: 2016-01-13 | Disposition: A | Discharge status: Home / Self Care | Payer: Self-pay | Attending: Emergency Medicine | Admitting: Emergency Medicine

## 2016-01-13 ENCOUNTER — Encounter: Payer: Self-pay | Admitting: Emergency Medicine

## 2016-01-13 DIAGNOSIS — I110 Hypertensive heart disease with heart failure: Secondary | ICD-10-CM | POA: Insufficient documentation

## 2016-01-13 DIAGNOSIS — F17210 Nicotine dependence, cigarettes, uncomplicated: Secondary | ICD-10-CM | POA: Insufficient documentation

## 2016-01-13 DIAGNOSIS — I2510 Atherosclerotic heart disease of native coronary artery without angina pectoris: Secondary | ICD-10-CM | POA: Insufficient documentation

## 2016-01-13 DIAGNOSIS — F1110 Opioid abuse, uncomplicated: Secondary | ICD-10-CM | POA: Insufficient documentation

## 2016-01-13 DIAGNOSIS — Z791 Long term (current) use of non-steroidal anti-inflammatories (NSAID): Secondary | ICD-10-CM | POA: Insufficient documentation

## 2016-01-13 DIAGNOSIS — I509 Heart failure, unspecified: Secondary | ICD-10-CM | POA: Insufficient documentation

## 2016-01-13 DIAGNOSIS — Z79899 Other long term (current) drug therapy: Secondary | ICD-10-CM | POA: Insufficient documentation

## 2016-01-13 DIAGNOSIS — I252 Old myocardial infarction: Secondary | ICD-10-CM | POA: Insufficient documentation

## 2016-01-13 DIAGNOSIS — Z7982 Long term (current) use of aspirin: Secondary | ICD-10-CM | POA: Insufficient documentation

## 2016-01-13 DIAGNOSIS — Z955 Presence of coronary angioplasty implant and graft: Secondary | ICD-10-CM | POA: Insufficient documentation

## 2016-01-13 LAB — URINE DRUG SCREEN, QUALITATIVE (ARMC ONLY)
AMPHETAMINES, UR SCREEN: NOT DETECTED
BENZODIAZEPINE, UR SCRN: NOT DETECTED
Barbiturates, Ur Screen: NOT DETECTED
Cannabinoid 50 Ng, Ur ~~LOC~~: NOT DETECTED
Cocaine Metabolite,Ur ~~LOC~~: NOT DETECTED
MDMA (ECSTASY) UR SCREEN: NOT DETECTED
METHADONE SCREEN, URINE: NOT DETECTED
Opiate, Ur Screen: NOT DETECTED
Phencyclidine (PCP) Ur S: NOT DETECTED
TRICYCLIC, UR SCREEN: NOT DETECTED

## 2016-01-13 LAB — ETHANOL

## 2016-01-13 LAB — COMPREHENSIVE METABOLIC PANEL
ALT: 11 U/L — AB (ref 17–63)
AST: 11 U/L — ABNORMAL LOW (ref 15–41)
Albumin: 4 g/dL (ref 3.5–5.0)
Alkaline Phosphatase: 80 U/L (ref 38–126)
Anion gap: 8 (ref 5–15)
BILIRUBIN TOTAL: 1.2 mg/dL (ref 0.3–1.2)
BUN: 15 mg/dL (ref 6–20)
CHLORIDE: 108 mmol/L (ref 101–111)
CO2: 24 mmol/L (ref 22–32)
CREATININE: 0.69 mg/dL (ref 0.61–1.24)
Calcium: 9.1 mg/dL (ref 8.9–10.3)
Glucose, Bld: 116 mg/dL — ABNORMAL HIGH (ref 65–99)
Potassium: 4 mmol/L (ref 3.5–5.1)
Sodium: 140 mmol/L (ref 135–145)
Total Protein: 8.3 g/dL — ABNORMAL HIGH (ref 6.5–8.1)

## 2016-01-13 LAB — URINALYSIS COMPLETE WITH MICROSCOPIC (ARMC ONLY)
BILIRUBIN URINE: NEGATIVE
Bacteria, UA: NONE SEEN
Glucose, UA: NEGATIVE mg/dL
Hgb urine dipstick: NEGATIVE
Ketones, ur: NEGATIVE mg/dL
LEUKOCYTES UA: NEGATIVE
NITRITE: NEGATIVE
PH: 6 (ref 5.0–8.0)
PROTEIN: 30 mg/dL — AB
Specific Gravity, Urine: 1.023 (ref 1.005–1.030)

## 2016-01-13 LAB — CBC WITH DIFFERENTIAL/PLATELET
BASOS ABS: 0.2 10*3/uL — AB (ref 0–0.1)
BASOS PCT: 1 %
EOS ABS: 0.3 10*3/uL (ref 0–0.7)
EOS PCT: 2 %
HCT: 47.2 % (ref 40.0–52.0)
Hemoglobin: 15.7 g/dL (ref 13.0–18.0)
LYMPHS PCT: 14 %
Lymphs Abs: 2.2 10*3/uL (ref 1.0–3.6)
MCH: 28.8 pg (ref 26.0–34.0)
MCHC: 33.3 g/dL (ref 32.0–36.0)
MCV: 86.7 fL (ref 80.0–100.0)
MONO ABS: 2 10*3/uL — AB (ref 0.2–1.0)
Monocytes Relative: 13 %
Neutro Abs: 10.8 10*3/uL — ABNORMAL HIGH (ref 1.4–6.5)
Neutrophils Relative %: 70 %
PLATELETS: 390 10*3/uL (ref 150–440)
RBC: 5.44 MIL/uL (ref 4.40–5.90)
RDW: 17.7 % — AB (ref 11.5–14.5)
WBC: 15.4 10*3/uL — AB (ref 3.8–10.6)

## 2016-01-13 LAB — SALICYLATE LEVEL

## 2016-01-13 LAB — ACETAMINOPHEN LEVEL: Acetaminophen (Tylenol), Serum: <10 ug/mL — ABNORMAL LOW (ref 10–30)

## 2016-01-13 MED ORDER — LORAZEPAM 1 MG PO TABS: 1.0000 mg | ORAL_TABLET | Freq: Two times a day (BID) | ORAL | Status: DC

## 2016-01-13 MED ORDER — LORAZEPAM 1 MG PO TABS
1.0000 mg | ORAL_TABLET | Freq: Once | ORAL | Status: AC
  Administered 2016-01-13: 1 mg via ORAL
  Filled 2016-01-13: qty 1

## 2016-01-13 MED ORDER — ONDANSETRON 4 MG PO TBDP
4.0000 mg | ORAL_TABLET | Freq: Once | ORAL | Status: AC
  Administered 2016-01-13: 4 mg via ORAL
  Filled 2016-01-13: qty 1

## 2016-01-13 MED ORDER — ONDANSETRON HCL 4 MG PO TABS: 4.0000 mg | ORAL_TABLET | Freq: Three times a day (TID) | ORAL | Status: DC | PRN

## 2016-01-13 NOTE — Discharge Instructions (Signed)
Opioid Use Disorder  Opioid use disorder is a mental disorder. It is the continued nonmedical use of opioids in spite of risks to health and well-being. Misused opioids include the street drug heroin. They also include pain medicines such as morphine, hydrocodone, oxycodone, and fentanyl. Opioids are very addictive. People who misuse opioids get an exaggerated feeling of well-being. Opioid use disorder often disrupts activities at home, work, or school. It may cause mental or physical problems.   A family history of opioid use disorder puts you at higher risk of it. People with opioid use disorder often misuse other drugs or have mental illness such as depression, posttraumatic stress disorder, or antisocial personality disorder. They also are at risk of suicide and death from overdose.  SIGNS AND SYMPTOMS   Signs and symptoms of opioid use disorder include:  · Use of opioids in larger amounts or over a longer period than intended.  · Unsuccessful attempts to cut down or control opioid use.  · A lot of time spent obtaining, using, or recovering from the effects of opioids.  · A strong desire or urge to use opioids (craving).  · Continued use of opioids in spite of major problems at work, school, or home because of use.  · Continued use of opioids in spite of relationship problems because of use.  · Giving up or cutting down on important life activities because of opioid use.  · Use of opioids over and over in situations when it is physically hazardous, such as driving a car.  · Continued use of opioids in spite of a physical problem that is likely related to use. Physical problems can include:  ¨ Severe constipation.  ¨ Poor nutrition.  ¨ Infertility.  ¨ Tuberculosis.  ¨ Aspiration pneumonia.  ¨ Infections such as human immunodeficiency virus (HIV) and hepatitis (from injecting opioids).  · Continued use of opioids in spite of a mental problem that is likely related to use. Mental problems can  include:  ¨ Depression.  ¨ Anxiety.  ¨ Hallucinations.  ¨ Sleep problems.  ¨ Loss of sexual function.  · Need to use more and more opioids to get the same effect, or lessened effect over time with use of the same amount (tolerance).  · Having withdrawal symptoms when opioid use is stopped, or using opioids to reduce or avoid withdrawal symptoms. Withdrawal symptoms include:  ¨ Depressed, anxious, or irritable mood.  ¨ Nausea, vomiting, diarrhea, or intestinal cramping.  ¨ Muscle aches or spasms.  ¨ Excessive tearing or runny nose.  ¨ Dilated pupils, sweating, or hairs standing on end.  ¨ Yawning.  ¨ Fever, raised blood pressure, or fast pulse.  ¨ Restlessness or trouble sleeping. This does not apply to people taking opioids for medical reasons only.  DIAGNOSIS  Opioid use disorder is diagnosed by your health care provider. You may be asked questions about your opioid use and and how it affects your life. A physical exam may be done. A drug screen may be ordered. You may be referred to a mental health professional. The diagnosis of opioid use disorder requires at least two symptoms within 12 months. The type of opioid use disorder you have depends on the number of signs and symptoms you have. The type may be:  · Mild. Two or three signs and symptoms.     · Moderate. Four or five signs and symptoms.    · Severe. Six or more signs and symptoms.  TREATMENT   Treatment is usually provided by mental   health professionals with training in substance use disorders. The following options are available:  · Detoxification. This is the first step in treatment for withdrawal. It is medically supervised withdrawal with the use of medicines. These medicines lessen withdrawal symptoms. They also raise the chance of becoming opioid free.  · Counseling, also known as talk therapy. Talk therapy addresses the reasons you use opioids. It also addresses ways to keep you from using again (relapse). The goals of talk therapy are to avoid  relapse by:  ¨ Identifying and avoiding triggers for use.  ¨ Finding healthy ways to cope with stress.  ¨ Learning how to handle cravings.  · Support groups. Support groups provide emotional support, advice, and guidance.  · A medicine that blocks opioid receptors in your brain. This medicine can reduce opioid cravings that lead to relapse. This medicine also blocks the desired opioid effect when relapse occurs.  · Opioids that are taken by mouth in place of the misused opioid (opioid maintenance treatment). These medicines satisfy cravings but are safer than commonly misused opioids. This often is the best option for people who continue to relapse with other treatments.  HOME CARE INSTRUCTIONS   · Take medicines only as directed by your health care provider.  · Check with your health care provider before starting new medicines.  · Keep all follow-up visits as directed by your health care provider.  SEEK MEDICAL CARE IF:  · You are not able to take your medicines as directed.  · Your symptoms get worse.  SEEK IMMEDIATE MEDICAL CARE IF:  · You have serious thoughts about hurting yourself or others.  · You may have taken an overdose of opioids.  FOR MORE INFORMATION  · National Institute on Drug Abuse: www.drugabuse.gov  · Substance Abuse and Mental Health Services Administration: www.samhsa.gov     This information is not intended to replace advice given to you by your health care provider. Make sure you discuss any questions you have with your health care provider.     Document Released: 04/14/2007 Document Revised: 07/08/2014 Document Reviewed: 06/30/2013  Elsevier Interactive Patient Education ©2016 Else[employer] Inc[employer]

## 2016-01-13 NOTE — ED Notes (Signed)
Presents with family with depression and also states he is going thur withdrawals from opiates  Denies any SI

## 2016-01-13 NOTE — ED Provider Notes (Addendum)
[facility] Emergency Department Provider Note  ____________________________________________   I have reviewed the triage vital signs and the nursing notes.   HISTORY  Chief Complaint Depression    HPI Brian Porter is a 50 y.o. male presents today complaining of worried about being in withdrawal because he takes Percocet 5 times a day and hasn't had it since this morning. Patient has had no vomiting. No chest pain.No vomiting. Denies alcohol or cocaine abuse. He biked to stop taking the pills he states. Has not overdosed on Tylenol or take an extra set above what would be normally prescribed she states.     Past Medical History  Diagnosis Date  . Hypertension   . MI, old   . CAD (coronary artery disease)   . Sleep apnea   . GERD (gastroesophageal reflux disease)     Patient Active Problem List   Diagnosis Date Noted  . HTN (hypertension) 11/02/2015  . Tobacco abuse 11/02/2015  . Hand joint pain 11/02/2015  . CHF (congestive heart failure) (HCC) 10/15/2015  . Chest pain 02/22/2015  . Chest pain at rest 02/22/2015    Past Surgical History  Procedure Laterality Date  . Splenectomy    . Partial nephrectomy Left   . Coronary angioplasty with stent placement  approx 2 years ago  . Cardiac catheterization Right 10/16/2015    Procedure: Left Heart Cath and Coronary Angiography;  Surgeon: Laurier Nancy, MD;  Location: [facility] INVASIVE CV LAB;  Service: Cardiovascular;  Laterality: Right;    Current Outpatient Rx  Name  Route  Sig  Dispense  Refill  . acetaminophen (TYLENOL) 325 MG tablet   Oral   Take 2 tablets (650 mg total) by mouth every 4 (four) hours as needed for headache or mild pain.         Marland Kitchen albuterol (PROVENTIL HFA) 108 (90 Base) MCG/ACT inhaler   Inhalation   Inhale 2 puffs into the lungs every 4 (four) hours as needed for wheezing or shortness of breath.   1 Inhaler   0   . aspirin EC 81 MG EC tablet   Oral   Take 1 tablet (81  mg total) by mouth daily.         . carvedilol (COREG) 6.25 MG tablet   Oral   Take 1 tablet (6.25 mg total) by mouth 2 (two) times daily with a meal.   60 tablet   0   . cephALEXin (KEFLEX) 500 MG capsule   Oral   Take 1 capsule (500 mg total) by mouth 4 (four) times daily.   28 capsule   0   . cyclobenzaprine (FLEXERIL) 10 MG tablet   Oral   Take 1 tablet (10 mg total) by mouth every 8 (eight) hours as needed for muscle spasms.   30 tablet   1   . diclofenac (VOLTAREN) 75 MG EC tablet   Oral   Take 1 tablet (75 mg total) by mouth 2 (two) times daily.   60 tablet   0   . furosemide (LASIX) 20 MG tablet   Oral   Take 1 tablet (20 mg total) by mouth daily.   30 tablet   3   . lisinopril (ZESTRIL) 10 MG tablet   Oral   Take 1 tablet (10 mg total) by mouth daily.   30 tablet   0   . meloxicam (MOBIC) 15 MG tablet   Oral   Take 1 tablet (15 mg total) by mouth  daily.   30 tablet   0   . naproxen (NAPROSYN) 500 MG tablet   Oral   Take 1 tablet (500 mg total) by mouth 2 (two) times daily with a meal.   20 tablet   0   . predniSONE (DELTASONE) 20 MG tablet   Oral   Take 2 tablets (40 mg total) by mouth daily.   8 tablet   0   . ranitidine (ZANTAC) 150 MG tablet   Oral   Take 150 mg by mouth 2 (two) times daily as needed for heartburn.         . spironolactone (ALDACTONE) 25 MG tablet   Oral   Take 1 tablet (25 mg total) by mouth daily.   30 tablet   3   . traMADol (ULTRAM) 50 MG tablet   Oral   Take 1 tablet (50 mg total) by mouth every 6 (six) hours as needed.   20 tablet   0     Allergies Review of patient's allergies indicates no known allergies.  Family History  Problem Relation Age of Onset  . Diabetes Mellitus II Brother   . Heart failure Mother   . Hypertension Mother     Social History Social History  Substance Use Topics  . Smoking status: Current Every Day Smoker -- 1.00 packs/day    Types: Cigarettes  . Smokeless tobacco:  Never Used  . Alcohol Use: No    Review of Systems Constitutional: No fever/chills Eyes: No visual changes. ENT: No sore throat. No stiff neck no neck pain Cardiovascular: Denies chest pain. Respiratory: Denies shortness of breath. Gastrointestinal:   no vomiting.  No diarrhea.  No constipation. Genitourinary: Negative for dysuria. Musculoskeletal: Negative lower extremity swelling Skin: Negative for rash. Neurological: Negative for headaches, focal weakness or numbness. 10-point ROS otherwise negative.  ____________________________________________   PHYSICAL EXAM:  VITAL SIGNS: ED Triage Vitals  Enc Vitals Group     BP 01/13/16 1346 145/96 mmHg     Pulse Rate 01/13/16 1346 104     Resp 01/13/16 1346 20     Temp 01/13/16 1346 98.2 F (36.8 C)     Temp Source 01/13/16 1346 Oral     SpO2 01/13/16 1346 97 %     Weight 01/13/16 1346 210 lb (95.255 kg)     Height 01/13/16 1346 5\' 9"  (1.753 m)     Head Cir --      Peak Flow --      Pain Score 01/13/16 1347 9     Pain Loc --      Pain Edu? --      Excl. in GC? --     Constitutional: Alert and oriented. Well appearing and in no acute distress. Eyes: Conjunctivae are normal. PERRL. EOMI. Head: Atraumatic. Nose: No congestion/rhinnorhea. Mouth/Throat: Mucous membranes are moist.  Oropharynx non-erythematous. Neck: No stridor.   Nontender with no meningismus Cardiovascular: Normal rate, regular rhythm. Grossly normal heart sounds.  Good peripheral circulation. Respiratory: Normal respiratory effort.  No retractions. Lungs CTAB. Abdominal: Soft and nontender. No distention. No guarding no rebound Back:  There is no focal tenderness or step off there is no midline tenderness there are no lesions noted. there is no CVA tenderness Musculoskeletal: No lower extremity tenderness. No joint effusions, no DVT signs strong distal pulses no edema Neurologic:  Normal speech and language. No gross focal neurologic deficits are  appreciated.  Skin:  Skin is warm, dry and intact. No rash noted. Psychiatric: Mood  and affect are normal. Speech and behavior are normal.  ____________________________________________   LABS (all labs ordered are listed, but only abnormal results are displayed)  Labs Reviewed  COMPREHENSIVE METABOLIC PANEL - Abnormal; Notable for the following:    Glucose, Bld 116 (*)    Total Protein 8.3 (*)    AST 11 (*)    ALT 11 (*)    All other components within normal limits  CBC WITH DIFFERENTIAL/PLATELET - Abnormal; Notable for the following:    WBC 15.4 (*)    RDW 17.7 (*)    Neutro Abs 10.8 (*)    Monocytes Absolute 2.0 (*)    Basophils Absolute 0.2 (*)    All other components within normal limits  ETHANOL  URINE DRUG SCREEN, QUALITATIVE ([facility] ONLY)  URINALYSIS COMPLETEWITH MICROSCOPIC ([facility] ONLY)  ACETAMINOPHEN LEVEL  SALICYLATE LEVEL   ____________________________________________  EKG  I personally interpreted any EKGs ordered by me or triage  ____________________________________________  RADIOLOGY  I reviewed any imaging ordered by me or triage that were performed during my shift and, if possible, patient and/or family made aware of any abnormal findings. ____________________________________________   PROCEDURES  Procedure(s) performed: None  Critical Care performed: None  ____________________________________________   INITIAL IMPRESSION / ASSESSMENT AND PLAN / ED COURSE  Pertinent labs & imaging results that were available during my care of the patient were reviewed by me and considered in my medical decision making (see chart for details).  Patient worried about withdrawal from his chronic narcotic abuse. Does not use IV narcotics. No evidence of acute withdrawal. Does not take alcohol he states. We have given him resources for outpatient detox. No SI or HI. Does not meet criteria for acute inpatient psychiatric help. This facility which is not a detox  facility. Denies alcohol abuse. Patient has no evidence of a Tylenol overdose chronic or acute. We'll always a little bit reluctant to start people on clonidine as I cannot monitor pressure as an outpatient.  ----------------------------------------- 6:11 PM on 01/13/2016 -----------------------------------------  Patient feels much better, he is somewhat anxious about going home. We have offered further treatment and he declines anything here. He states he really would like to leave. He is showing no evidence of distress. His heart rate slightly up he states is because he is nervous about facing withdrawal. We'll send him home with some antinausea medication. Return precautions and follow-up given and understood. Patient stresses that he does not take any alcohol he is not tremulous and does not appear to be otherwise unwell and at this time he is declining further care from us. ____________________________________________   FINAL CLINICAL IMPRESSION(S) / ED DIAGNOSES  Final diagnoses:  None      This chart was dictated using voice recognition software.  Despite best efforts to proofread,  errors can occur which can change meaning.     Jeanmarie PlantJames A Arsema Tusing, MD 01/13/16 1733  Jeanmarie PlantJames A Jerelyn Trimarco, MD 01/13/16 [phone]

## 2016-01-13 NOTE — ED Notes (Signed)
Social work visited with pt. Gave resources for [employer] and shelters.

## 2016-01-13 NOTE — ED Notes (Signed)
TTS at bedside talking to pt.

## 2016-01-13 NOTE — BH Assessment (Addendum)
Assessment Note  Patient is a 50 year old male that requests detox from percocet or medication from the ER MD to address his withdrawal symptoms.  Patient denies SI/HI/Psychosis.  Patient denies prior inpatient hospitalization.  Patient denies prior mental health or psychiatric medication management. Patient denies physical, sexual or emotional abuse.   Patient reports that the first time her used pain pills was five years ago.  Patient reports that he uses (10mg ) percocet five times a day.  Patient reports that he abuses the pain pills daily.  Patient denies prior inpatient or outpatient substance abuse services.  Patient denies a history of seizures.   Patient reports that he does not remember a time when he attained sobriety.  Patient reports receiving treatment and detox services from [facility] but does not remember the year when he received services at [facility]. Patient denies withdrawal symptoms.       Diagnosis: Opiate Abuse   Past Medical History:  Past Medical History  Diagnosis Date  . Hypertension   . MI, old   . CAD (coronary artery disease)   . Sleep apnea   . GERD (gastroesophageal reflux disease)     Past Surgical History  Procedure Laterality Date  . Splenectomy    . Partial nephrectomy Left   . Coronary angioplasty with stent placement  approx 2 years ago  . Cardiac catheterization Right 10/16/2015    Procedure: Left Heart Cath and Coronary Angiography;  Surgeon: Laurier Nancy, MD;  Location: [facility] INVASIVE CV LAB;  Service: Cardiovascular;  Laterality: Right;    Family History:  Family History  Problem Relation Age of Onset  . Diabetes Mellitus II Brother   . Heart failure Mother   . Hypertension Mother     Social History:  reports that he has been smoking Cigarettes.  He has been smoking about 1.00 pack per day. He has never used smokeless tobacco. He reports that he does not drink alcohol or use illicit drugs.  Additional Social History:  Alcohol / Drug Use History  of alcohol / drug use?: Yes Longest period of sobriety (when/how long): Patient denies a history of sobriety Negative Consequences of Use: Financial, Personal relationships, Work / School Withdrawal Symptoms: Agitation, Fever / Chills, Irritability, Weakness, Nausea / Vomiting, Patient aware of relationship between substance abuse and physical/medical complications, Sweats Substance #1 Name of Substance 1: Percocet 1 - Age of First Use: 50 years old  1 - Amount (size/oz): 5  (10mg ) 1 - Frequency: Daily 1 - Duration: For the past five years 1 - Last Use / Amount: Today  - patient reports using 4 (10mg ) pills   CIWA: CIWA-Ar BP: (!) 145/96 mmHg Pulse Rate: (!) 104 COWS: Clinical Opiate Withdrawal Scale (COWS) Resting Pulse Rate: Pulse Rate 101-120 Sweating: No report of chills or flushing Restlessness: Able to sit still Pupil Size: Pupils pinned or normal size for room light Bone or Joint Aches: Not present Runny Nose or Tearing: Not present GI Upset: nausea or loose stool Tremor: No tremor Yawning: No yawning Anxiety or Irritability: None Gooseflesh Skin: Skin is smooth COWS Total Score: 4  Allergies: No Known Allergies  Home Medications:  (Not in a hospital admission)  OB/GYN Status:  No LMP for male patient.  General Assessment Data Location of Assessment: [facility] ED TTS Assessment: In system Is this a Tele or Face-to-Face Assessment?: Face-to-Face Is this an Initial Assessment or a Re-assessment for this encounter?: Initial Assessment Marital status: Single Maiden name: NA Is patient pregnant?: No Pregnancy Status:  No Living Arrangements: Other (Comment) (Lives with his brother) Can pt return to current living arrangement?: Yes Admission Status: Voluntary Is patient capable of signing voluntary admission?: Yes Referral Source: Self/Family/Friend Insurance type: Self Pay     Crisis Care Plan Living Arrangements: Other (Comment) (Lives with his brother) Legal  Guardian:  (NA) Name of Psychiatrist: None Reported Name of Therapist: None Reported  Education Status Is patient currently in school?: No Current Grade: NA Highest grade of school patient has completed: NA Name of school: NA Contact person: NA  Risk to self with the past 6 months Suicidal Ideation: No Has patient been a risk to self within the past 6 months prior to admission? : No Suicidal Intent: No Has patient had any suicidal intent within the past 6 months prior to admission? : No Is patient at risk for suicide?: No Suicidal Plan?: No Has patient had any suicidal plan within the past 6 months prior to admission? : No Access to Means: No What has been your use of drugs/alcohol within the last 12 months?: Percocet pain pills Previous Attempts/Gestures: No How many times?: 0 Other Self Harm Risks: None Reported Triggers for Past Attempts: None known Intentional Self Injurious Behavior: None Family Suicide History: No Recent stressful life event(s): Job Loss, Financial Problems Persecutory voices/beliefs?: No Depression: Yes Depression Symptoms: Despondent, Loss of interest in usual pleasures, Feeling worthless/self pity, Guilt Substance abuse history and/or treatment for substance abuse?: Yes Suicide prevention information given to non-admitted patients: Not applicable  Risk to Others within the past 6 months Homicidal Ideation: No Does patient have any lifetime risk of violence toward others beyond the six months prior to admission? : No Thoughts of Harm to Others: No Current Homicidal Intent: No Current Homicidal Plan: No Access to Homicidal Means: No Identified Victim: None Reported History of harm to others?: No Assessment of Violence: None Noted Violent Behavior Description: Calm Does patient have access to weapons?: No Criminal Charges Pending?: No Does patient have a court date: No Is patient on probation?: No  Psychosis Hallucinations: None  noted Delusions: None noted  Mental Status Report Appearance/Hygiene: Disheveled Eye Contact: Good Motor Activity: Freedom of movement Speech: Logical/coherent Level of Consciousness: Alert, Quiet/awake Mood: Depressed Affect: Appropriate to circumstance Anxiety Level: None Thought Processes: Coherent, Relevant Judgement: Unimpaired Orientation: Person, Place, Time, Situation Obsessive Compulsive Thoughts/Behaviors: None  Cognitive Functioning Concentration: Decreased Memory: Recent Intact, Remote Intact IQ: Average Insight: Fair Impulse Control: Poor Appetite: Poor Weight Loss: 0 Weight Gain: 0 Sleep: No Change Total Hours of Sleep: 8 Vegetative Symptoms: Decreased grooming, Not bathing  ADLScreening [facility] Assessment Services) Patient's cognitive ability adequate to safely complete daily activities?: Yes Patient able to express need for assistance with ADLs?: Yes Independently performs ADLs?: Yes (appropriate for developmental age)  Prior Inpatient Therapy Prior Inpatient Therapy: No Prior Therapy Dates: NA Prior Therapy Facilty/Provider(s): NA Reason for Treatment: NA  Prior Outpatient Therapy Prior Outpatient Therapy: No Prior Therapy Dates: NA Prior Therapy Facilty/Provider(s): NA Reason for Treatment: NA Does patient have an ACCT team?: No Does patient have Intensive In-House Services?  : No Does patient have [facility] services? : No Does patient have P4CC services?: No  ADL Screening (condition at time of admission) Patient's cognitive ability adequate to safely complete daily activities?: Yes Is the patient deaf or have difficulty hearing?: No Does the patient have difficulty seeing, even when wearing glasses/contacts?: No Does the patient have difficulty concentrating, remembering, or making decisions?: No Patient able to express need for assistance  with ADLs?: Yes Does the patient have difficulty dressing or bathing?: No Independently performs ADLs?:  Yes (appropriate for developmental age) Does the patient have difficulty walking or climbing stairs?: No Weakness of Legs: None Weakness of Arms/Hands: None  Home Assistive Devices/Equipment Home Assistive Devices/Equipment: None    Abuse/Neglect Assessment (Assessment to be complete while patient is alone) Physical Abuse: Denies Verbal Abuse: Denies Sexual Abuse: Denies Exploitation of patient/patient's resources: Denies Self-Neglect: Denies Values / Beliefs Cultural Requests During Hospitalization: None Spiritual Requests During Hospitalization: None Consults Spiritual Care Consult Needed: No Social Work Consult Needed: No Merchant navy officer (For Healthcare) Does patient have an advance directive?: No Would patient like information on creating an advanced directive?: No - patient declined information    Additional Information 1:1 In Past 12 Months?: No CIRT Risk: No Elopement Risk: No Does patient have medical clearance?: Yes     Disposition: Pending psych disposition Disposition Initial Assessment Completed for this Encounter: Yes  On Site Evaluation by:   Reviewed with Physician:    Phillip Heal LaVerne 01/13/2016 5:33 PM

## 2016-01-13 NOTE — ED Notes (Addendum)
Pt refusing discharge, reports he does not want to go back to his brothers house. TTS and social work called. Social worker will come down to visit pt.

## 2016-01-13 NOTE — BH Assessment (Signed)
TTS completed assessment.  Patient pending psych disposition.  Patient reports that he wants detox or he wants the ER MD to give his some type of medication for his withdrawal symptoms.  Writer contacted [facility] and [facility] regarding bed opening for detox or treatment beds for patients without insurance.  Writer informed the patient that per [facility] and [facility] they do not have any detox or treatment beds.    Writer provided the patient with information for outpatient resources.

## 2016-03-24 IMAGING — US US PELVIS LIMITED
1 series · 14 of 22 positions shown · non-contrast
Comparison: [DATE]

CLINICAL DATA: Left lower quadrant pain for 1 month.

EXAM:
LIMITED ULTRASOUND OF PELVIS
TECHNIQUE: Limited transabdominal ultrasound examination of the pelvis was
performed.

[Series 1: us pelvis limited · 0.09mm/px · 22 acquisitions, 14 frames shown]
[im 1/22]
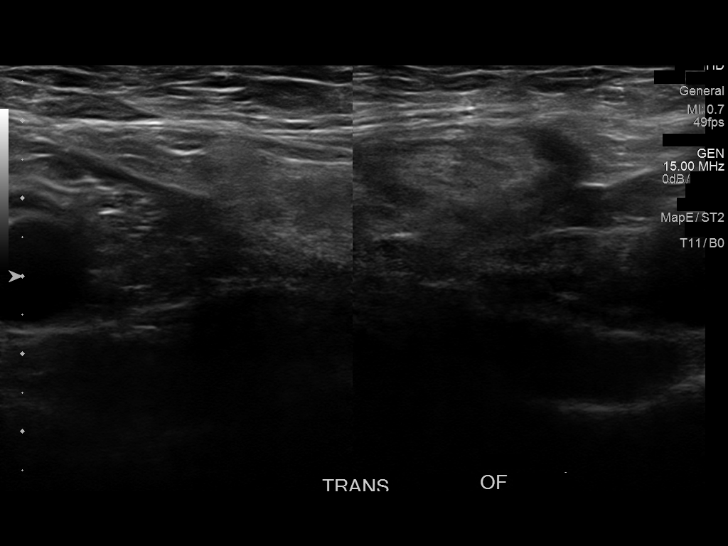
[im 3/22]
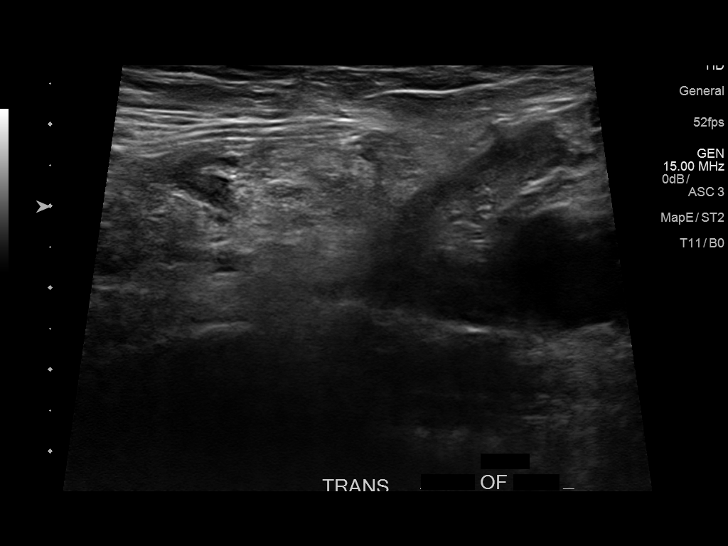
[im 4/22]
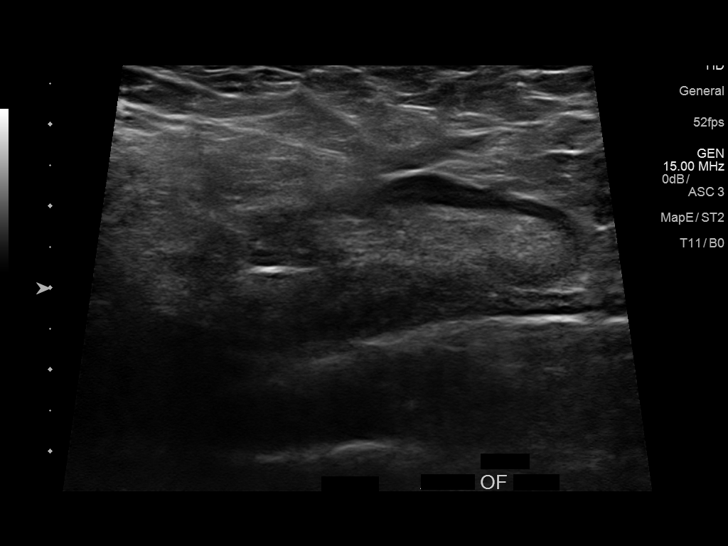
[im 6/22]
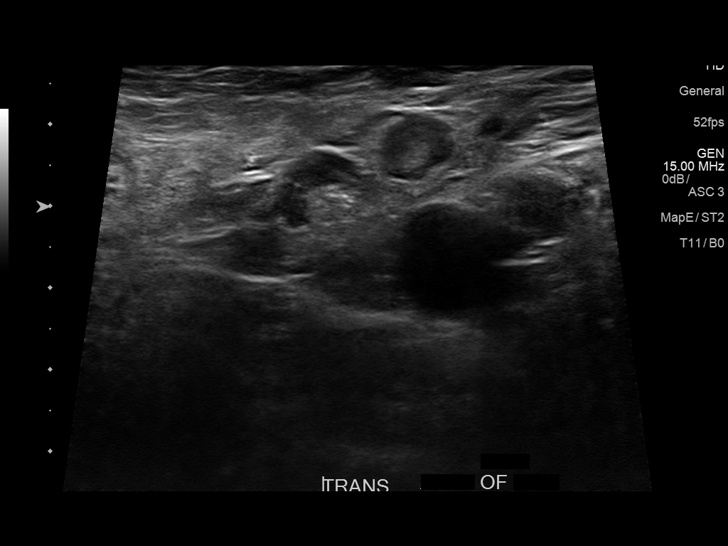
[im 8/22]
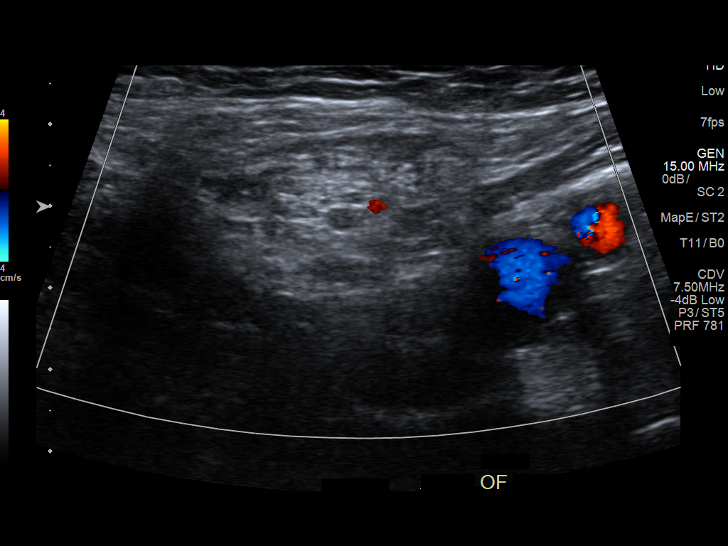
[im 9/22]
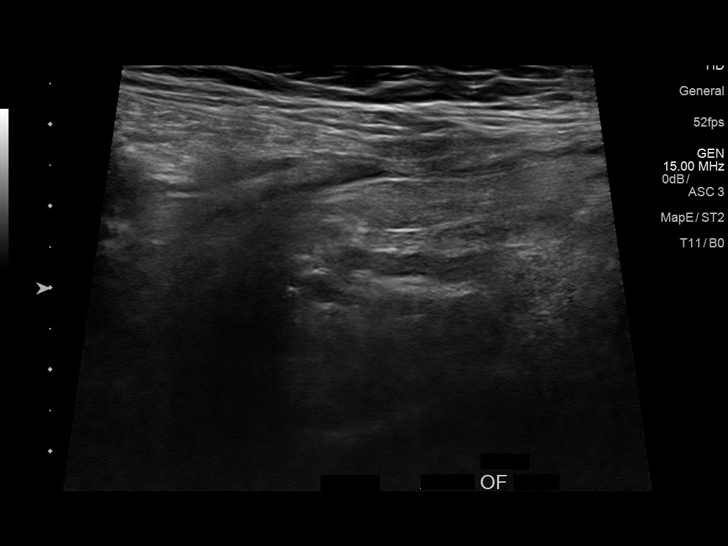
[im 11/22]
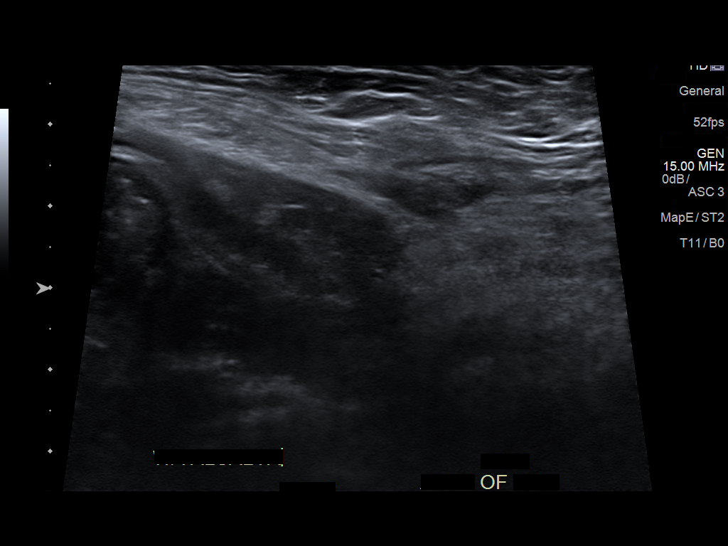
[im 12/22]
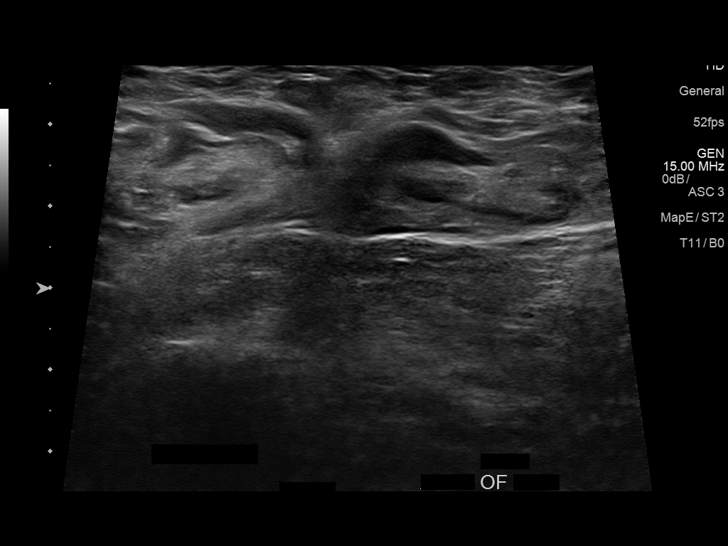
[im 14/22]
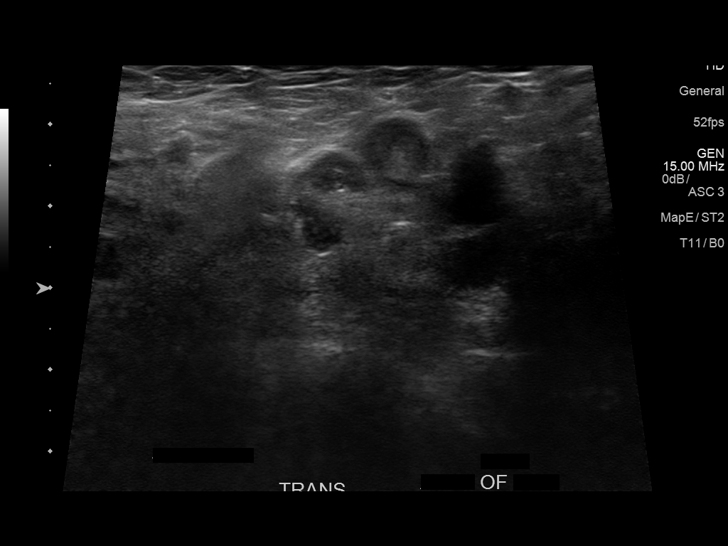
[im 15/22]
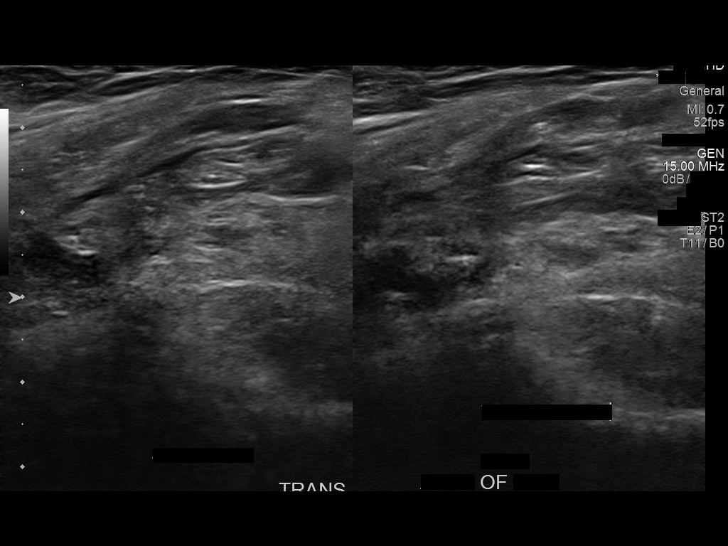
[im 17/22]
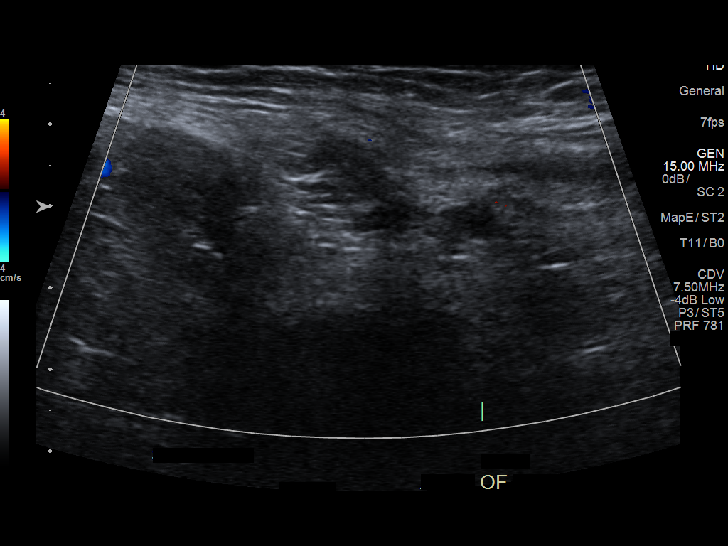
[im 19/22]
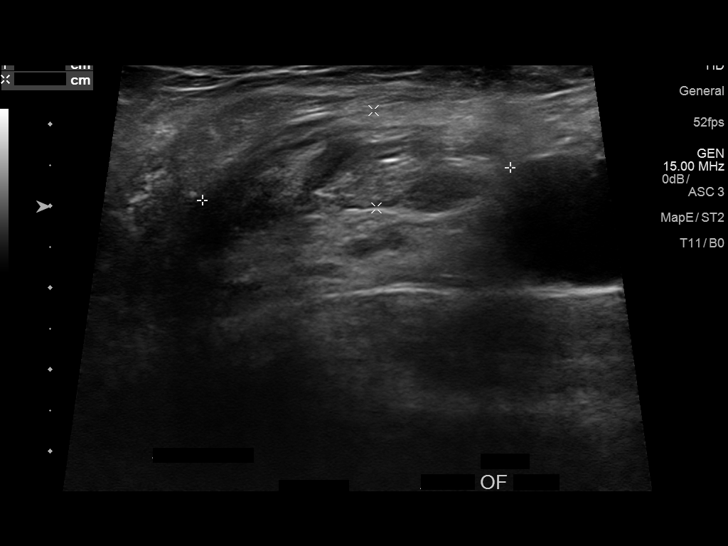
[im 20/22]
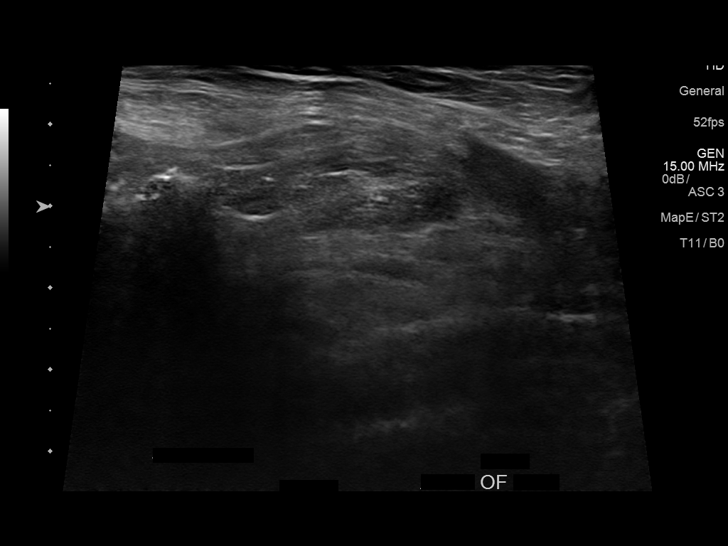
[im 22/22]
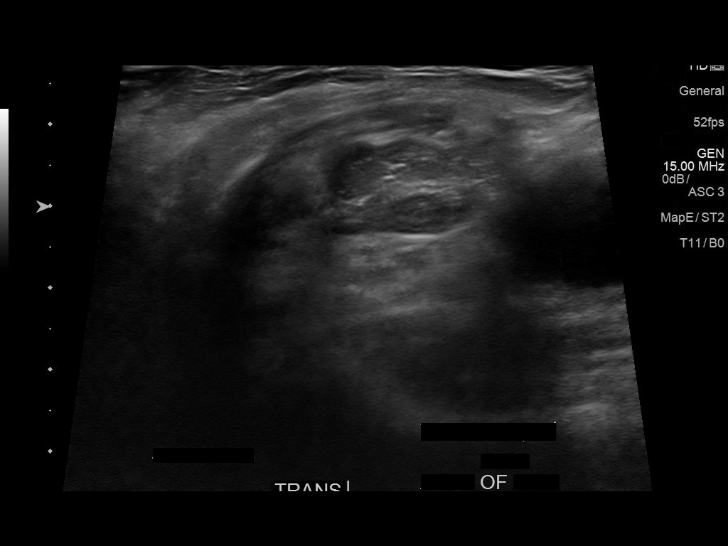

[14 of 22 positions shown; findings below may reference images not displayed]

FINDINGS: Anatomic area evaluated:  Left lower quadrant of the pelvis

Hernia: Yes. Suspect recurrent left inguinal hernia which contains
bowel. This measures approximately 3.8 x 1.2 x 3.2 cm.

Other: Prominent lymph node identified within the left inguinal
region measuring 3.9 x 1.4 x 0.9 cm.
IMPRESSION: 1. Complex mass in the area of concern within the left inguinal
region noted and suspicious for recurrent hernia containing bowel.

## 2016-03-27 ENCOUNTER — Encounter: Payer: Self-pay | Admitting: Emergency Medicine

## 2016-03-27 ENCOUNTER — Emergency Department
Admission: EM | Admit: 2016-03-27 | Discharge: 2016-03-27 | Disposition: A | Discharge status: Home / Self Care | Payer: Self-pay | Attending: Emergency Medicine | Admitting: Emergency Medicine

## 2016-03-27 DIAGNOSIS — Z791 Long term (current) use of non-steroidal anti-inflammatories (NSAID): Secondary | ICD-10-CM | POA: Insufficient documentation

## 2016-03-27 DIAGNOSIS — F1120 Opioid dependence, uncomplicated: Secondary | ICD-10-CM | POA: Insufficient documentation

## 2016-03-27 DIAGNOSIS — G8929 Other chronic pain: Secondary | ICD-10-CM | POA: Insufficient documentation

## 2016-03-27 DIAGNOSIS — I252 Old myocardial infarction: Secondary | ICD-10-CM | POA: Insufficient documentation

## 2016-03-27 DIAGNOSIS — I2510 Atherosclerotic heart disease of native coronary artery without angina pectoris: Secondary | ICD-10-CM | POA: Insufficient documentation

## 2016-03-27 DIAGNOSIS — M542 Cervicalgia: Secondary | ICD-10-CM | POA: Insufficient documentation

## 2016-03-27 DIAGNOSIS — Z7952 Long term (current) use of systemic steroids: Secondary | ICD-10-CM | POA: Insufficient documentation

## 2016-03-27 DIAGNOSIS — I110 Hypertensive heart disease with heart failure: Secondary | ICD-10-CM | POA: Insufficient documentation

## 2016-03-27 DIAGNOSIS — F17210 Nicotine dependence, cigarettes, uncomplicated: Secondary | ICD-10-CM | POA: Insufficient documentation

## 2016-03-27 DIAGNOSIS — Z7982 Long term (current) use of aspirin: Secondary | ICD-10-CM | POA: Insufficient documentation

## 2016-03-27 DIAGNOSIS — I509 Heart failure, unspecified: Secondary | ICD-10-CM | POA: Insufficient documentation

## 2016-03-27 DIAGNOSIS — F1110 Opioid abuse, uncomplicated: Secondary | ICD-10-CM

## 2016-03-27 LAB — URINE DRUG SCREEN, QUALITATIVE (ARMC ONLY)
AMPHETAMINES, UR SCREEN: NOT DETECTED
BENZODIAZEPINE, UR SCRN: NOT DETECTED
Barbiturates, Ur Screen: NOT DETECTED
COCAINE METABOLITE, UR ~~LOC~~: NOT DETECTED
Cannabinoid 50 Ng, Ur ~~LOC~~: NOT DETECTED
MDMA (ECSTASY) UR SCREEN: NOT DETECTED
METHADONE SCREEN, URINE: NOT DETECTED
OPIATE, UR SCREEN: POSITIVE — AB
PHENCYCLIDINE (PCP) UR S: NOT DETECTED
Tricyclic, Ur Screen: NOT DETECTED

## 2016-03-27 LAB — BASIC METABOLIC PANEL
ANION GAP: 8 (ref 5–15)
BUN: 15 mg/dL (ref 6–20)
CALCIUM: 8.7 mg/dL — AB (ref 8.9–10.3)
CO2: 26 mmol/L (ref 22–32)
Chloride: 106 mmol/L (ref 101–111)
Creatinine, Ser: 1.05 mg/dL (ref 0.61–1.24)
GFR calc Af Amer: >60 mL/min (ref >60)
GFR calc non Af Amer: >60 mL/min (ref >60)
GLUCOSE: 102 mg/dL — AB (ref 65–99)
Potassium: 3.9 mmol/L (ref 3.5–5.1)
Sodium: 140 mmol/L (ref 135–145)

## 2016-03-27 LAB — TROPONIN I

## 2016-03-27 LAB — CBC
HEMATOCRIT: 42.7 % (ref 40.0–52.0)
HEMOGLOBIN: 15 g/dL (ref 13.0–18.0)
MCH: 31.7 pg (ref 26.0–34.0)
MCHC: 35 g/dL (ref 32.0–36.0)
MCV: 90.6 fL (ref 80.0–100.0)
Platelets: 305 10*3/uL (ref 150–440)
RBC: 4.72 MIL/uL (ref 4.40–5.90)
RDW: 14.9 % — ABNORMAL HIGH (ref 11.5–14.5)
WBC: 11.6 10*3/uL — ABNORMAL HIGH (ref 3.8–10.6)

## 2016-03-27 LAB — SALICYLATE LEVEL: Salicylate Lvl: <4 mg/dL (ref 2.8–30.0)

## 2016-03-27 LAB — ACETAMINOPHEN LEVEL: Acetaminophen (Tylenol), Serum: <10 ug/mL — ABNORMAL LOW (ref 10–30)

## 2016-03-27 LAB — ETHANOL: Alcohol, Ethyl (B): <5 mg/dL (ref <5)

## 2016-03-27 MED ORDER — CLONIDINE HCL 0.1 MG PO TABS: 0.1000 mg | ORAL_TABLET | Freq: Two times a day (BID) | ORAL | 0 refills | Status: DC | PRN

## 2016-03-27 MED ORDER — PROMETHAZINE HCL 25 MG PO TABS
25.0000 mg | ORAL_TABLET | Freq: Once | ORAL | Status: AC
Start: 2016-03-27 — End: 2016-03-27
  Administered 2016-03-27: 25 mg via ORAL
  Filled 2016-03-27: qty 1

## 2016-03-27 MED ORDER — PROMETHAZINE HCL 25 MG PO TABS: 25.0000 mg | ORAL_TABLET | Freq: Four times a day (QID) | ORAL | 0 refills | Status: DC | PRN

## 2016-03-27 MED ORDER — CLONIDINE HCL 0.1 MG PO TABS
0.1000 mg | ORAL_TABLET | Freq: Once | ORAL | Status: AC
  Administered 2016-03-27: 0.1 mg via ORAL
  Filled 2016-03-27: qty 1

## 2016-03-27 NOTE — ED Provider Notes (Signed)
[facility] Emergency Department Provider Note  ____________________________________________  Time seen: Approximately 3:32 PM  I have reviewed the triage vital signs and the nursing notes.   HISTORY  Chief Complaint Psychiatric Evaluation   HPI EULAS Porter is a 50 y.o. male a history of coronary artery disease, hypertension, and opiate abuse who presents requesting detox. Patient reports that he takes 30-40 mg daily of Percocet. Has been doing this for 7 years. First started taking after surgery. He usually buys it from the street. He last took it yesterday morning. He also smokes a pack a day. He denies any other drug use or alcohol use. He reports right now that he is having neck pain which is chronic for him and unchanged from prior. He denies nausea, vomiting, diarrhea, tremors, diaphoresis, chest pain, shortness of breath, abdominal pain. Patient reports that he never try to quit on his own before.  Past Medical History:  Diagnosis Date  . CAD (coronary artery disease)   . GERD (gastroesophageal reflux disease)   . Hypertension   . MI, old   . Sleep apnea     Patient Active Problem List   Diagnosis Date Noted  . HTN (hypertension) 11/02/2015  . Tobacco abuse 11/02/2015  . Hand joint pain 11/02/2015  . CHF (congestive heart failure) (HCC) 10/15/2015  . Chest pain 02/22/2015  . Chest pain at rest 02/22/2015    Past Surgical History:  Procedure Laterality Date  . CARDIAC CATHETERIZATION Right 10/16/2015   Procedure: Left Heart Cath and Coronary Angiography;  Surgeon: Laurier Nancy, MD;  Location: [facility] INVASIVE CV LAB;  Service: Cardiovascular;  Laterality: Right;  . CORONARY ANGIOPLASTY WITH STENT PLACEMENT  approx 2 years ago  . PARTIAL NEPHRECTOMY Left   . SPLENECTOMY      Prior to Admission medications   Medication Sig Start Date End Date Taking? Authorizing Provider  acetaminophen (TYLENOL) 325 MG tablet Take 2 tablets (650 mg total)  by mouth every 4 (four) hours as needed for headache or mild pain. 10/16/15   Ramonita Lab, MD  albuterol (PROVENTIL HFA) 108 (90 Base) MCG/ACT inhaler Inhale 2 puffs into the lungs every 4 (four) hours as needed for wheezing or shortness of breath. 09/05/15   Sharman Cheek, MD  aspirin EC 81 MG EC tablet Take 1 tablet (81 mg total) by mouth daily. 10/16/15   Ramonita Lab, MD  carvedilol (COREG) 6.25 MG tablet Take 1 tablet (6.25 mg total) by mouth 2 (two) times daily with a meal. 10/16/15   Ramonita Lab, MD  cephALEXin (KEFLEX) 500 MG capsule Take 1 capsule (500 mg total) by mouth 4 (four) times daily. 01/12/16   Delorise Royals Cuthriell, PA-C  cyclobenzaprine (FLEXERIL) 10 MG tablet Take 1 tablet (10 mg total) by mouth every 8 (eight) hours as needed for muscle spasms. 07/31/15   Evangeline Dakin, PA-C  diclofenac (VOLTAREN) 75 MG EC tablet Take 1 tablet (75 mg total) by mouth 2 (two) times daily. 07/31/15   Charmayne Sheer Beers, PA-C  furosemide (LASIX) 20 MG tablet Take 1 tablet (20 mg total) by mouth daily. 11/28/15 11/27/16  Delma Freeze, FNP  lisinopril (ZESTRIL) 10 MG tablet Take 1 tablet (10 mg total) by mouth daily. 11/28/15   Delma Freeze, FNP  LORazepam (ATIVAN) 1 MG tablet Take 1 tablet (1 mg total) by mouth 2 (two) times daily. 01/13/16 01/12/17  Jeanmarie Plant, MD  meloxicam (MOBIC) 15 MG tablet Take 1 tablet (15 mg  total) by mouth daily. 01/12/16   Delorise Royals Cuthriell, PA-C  naproxen (NAPROSYN) 500 MG tablet Take 1 tablet (500 mg total) by mouth 2 (two) times daily with a meal. 09/05/15   Sharman Cheek, MD  ondansetron (ZOFRAN) 4 MG tablet Take 1 tablet (4 mg total) by mouth every 8 (eight) hours as needed for nausea or vomiting. 01/13/16   Jeanmarie Plant, MD  predniSONE (DELTASONE) 20 MG tablet Take 2 tablets (40 mg total) by mouth daily. 09/05/15   Sharman Cheek, MD  ranitidine (ZANTAC) 150 MG tablet Take 150 mg by mouth 2 (two) times daily as needed for heartburn.    Historical Provider, MD    spironolactone (ALDACTONE) 25 MG tablet Take 1 tablet (25 mg total) by mouth daily. 11/28/15   Delma Freeze, FNP  traMADol (ULTRAM) 50 MG tablet Take 1 tablet (50 mg total) by mouth every 6 (six) hours as needed. 08/06/15   Jennye Moccasin, MD    Allergies Review of patient's allergies indicates no known allergies.  Family History  Problem Relation Age of Onset  . Heart failure Mother   . Hypertension Mother   . Diabetes Mellitus II Brother     Social History Social History  Substance Use Topics  . Smoking status: Current Every Day Smoker    Packs/day: 1.00    Types: Cigarettes  . Smokeless tobacco: Never Used  . Alcohol use No    Review of Systems  Constitutional: Negative for fever. Eyes: Negative for visual changes. ENT: Negative for sore throat. Cardiovascular: Negative for chest pain. Respiratory: Negative for shortness of breath. Gastrointestinal: Negative for abdominal pain, vomiting or diarrhea. Genitourinary: Negative for dysuria. Musculoskeletal: Negative for back pain. Skin: Negative for rash. Neurological: Negative for headaches, weakness or numbness.  ____________________________________________   PHYSICAL EXAM:  VITAL SIGNS: ED Triage Vitals  Enc Vitals Group     BP 03/27/16 1145 123/78     Pulse Rate 03/27/16 1145 95     Resp 03/27/16 1145 16     Temp 03/27/16 1145 98.6 F (37 C)     Temp Source 03/27/16 1145 Oral     SpO2 03/27/16 1145 96 %     Weight 03/27/16 1145 200 lb (90.7 kg)     Height 03/27/16 1145 5\' 10"  (1.778 m)     Head Circumference --      Peak Flow --      Pain Score 03/27/16 1146 6     Pain Loc --      Pain Edu? --      Excl. in GC? --     Constitutional: Alert and oriented. Well appearing and in no apparent distress. HEENT:      Head: Normocephalic and atraumatic.         Eyes: Conjunctivae are normal. Sclera is non-icteric. EOMI. PERRL      Mouth/Throat: Mucous membranes are moist.       Neck: Supple with no signs  of meningismus. Cardiovascular: Regular rate and rhythm. No murmurs, gallops, or rubs. 2+ symmetrical distal pulses are present in all extremities. No JVD. Respiratory: Normal respiratory effort. Lungs are clear to auscultation bilaterally. No wheezes, crackles, or rhonchi.  Gastrointestinal: Soft, non tender, and non distended with positive bowel sounds. No rebound or guarding. Genitourinary: No CVA tenderness. Musculoskeletal: Nontender with normal range of motion in all extremities. No edema, cyanosis, or erythema of extremities. Neurologic: Normal speech and language. Face is symmetric. Moving all extremities. No gross focal neurologic  deficits are appreciated. Skin: Skin is warm, dry and intact. No rash noted. Psychiatric: Mood and affect are normal. Speech and behavior are normal.  ____________________________________________   LABS (all labs ordered are listed, but only abnormal results are displayed)  Labs Reviewed  BASIC METABOLIC PANEL - Abnormal; Notable for the following:       Result Value   Glucose, Bld 102 (*)    Calcium 8.7 (*)    All other components within normal limits  CBC - Abnormal; Notable for the following:    WBC 11.6 (*)    RDW 14.9 (*)    All other components within normal limits  URINE DRUG SCREEN, QUALITATIVE ([facility] ONLY) - Abnormal; Notable for the following:    Opiate, Ur Screen POSITIVE (*)    All other components within normal limits  ACETAMINOPHEN LEVEL - Abnormal; Notable for the following:    Acetaminophen (Tylenol), Serum <10 (*)    All other components within normal limits  TROPONIN I  ETHANOL  SALICYLATE LEVEL   ____________________________________________  EKG  ED ECG REPORT I, Nita Sicklearolina Sojourner Behringer, the attending physician, personally viewed and interpreted this ECG.  Normal sinus rhythm, rate of 86, occasional PVCs, normal intervals, left axis deviation, no ST elevations or depressions. Unchanged from  prior. ____________________________________________  RADIOLOGY  none  ____________________________________________   PROCEDURES  Procedure(s) performed: None Procedures Critical Care performed:  None ____________________________________________   INITIAL IMPRESSION / ASSESSMENT AND PLAN / ED COURSE   50 y.o. male a history of coronary artery disease, hypertension, and opiate abuse who presents requesting detox from opiates. Last took percocet yesterday morning. Patient has no complaints at this time. We'll consult TTS for inpatient placement. Will check tylenol and salicylate levels. If labs WNL and no inpatient placement available will plan to dc home.   Clinical Course   _________________________ 3:37 PM on 03/27/2016 -----------------------------------------  Labs WNL. Patient medically cleared awaiting TTS eval.   Pertinent labs & imaging results that were available during my care of the patient were reviewed by me and considered in my medical decision making (see chart for details).    ____________________________________________   FINAL CLINICAL IMPRESSION(S) / ED DIAGNOSES  Final diagnoses:  Opiate abuse, continuous  Uncomplicated opioid dependence (HCC)      NEW MEDICATIONS STARTED DURING THIS VISIT:  New Prescriptions   No medications on file     Note:  This document was prepared using Dragon voice recognition software and may include unintentional dictation errors.    Nita Sicklearolina Shanah Guimaraes, MD 03/27/16 1538

## 2016-03-27 NOTE — ED Notes (Signed)
Pt up and standing beside stretcher. Pt inquired if he was leaving, states that he is not.

## 2016-03-27 NOTE — ED Notes (Signed)
Pt to ED for detox for opiates, dneies any SI / HI. Pt c/o dizziness and bilat leg cramps. Pt last use was yesterday

## 2016-03-27 NOTE — BH Assessment (Signed)
Tele Assessment Note   Brian Porter is an 50 y.o. male, Caucasian, Single who presents to [facility] ED, per ED Report:history of coronary artery disease, hypertension, and opiate abuse who presents requesting detox. Patient reports that he takes 30-40 mg daily of Percocet. Has been doing this for 7 years. First started taking after surgery. He usually buys it from the street. He last took it yesterday morning. He also smokes a pack a day. He denies any other drug use or alcohol use. He reports right now that he is having neck pain which is chronic for him and unchanged from prior. He denies nausea, vomiting, diarrhea, tremors, diaphoresis, chest pain, shortness of breath, abdominal pain. Patient reports that he never try to quit on his own before. Patient states primary concern is to get detox for acceptance to [facility]. Patient state she is currently homeless and has hx. Of opiate abuse with use of Percocet daily x 5 10 mg. Or more per day with last use on 03/26/16. Patient states that he has had no recent change in sleep with x 5 hours to 6 per night.  Patient denies current or hx. Of SI or plan. Patient denies current or past hx. Of HI. Patient denies current or past hx. Of AVH. Patient acknowledges hx. And current S.A. With x 1 report of detox tx. At unspecified date with [facility] this year. Patient denies residential tx. Hx. Or inpatient psych tx. Patient denies current or past hx.of outpatient psych care.   Patient is dressed in normal street attire and is alert and oriented x4. Patient speech was within normal limits and motor behavior appeared normal. Patient thought process is coherent. Patient  does not appear to be responding to internal stimuli. Patient was cooperative throughout the assessment.   Diagnosis: Substance Induced Mood Disorder; Opiate Use Disorder, Severe  Past Medical History:  Past Medical History:  Diagnosis Date  . CAD (coronary artery disease)   . GERD  (gastroesophageal reflux disease)   . Hypertension   . MI, old   . Sleep apnea     Past Surgical History:  Procedure Laterality Date  . CARDIAC CATHETERIZATION Right 10/16/2015   Procedure: Left Heart Cath and Coronary Angiography;  Surgeon: Laurier Nancy, MD;  Location: [facility] INVASIVE CV LAB;  Service: Cardiovascular;  Laterality: Right;  . CORONARY ANGIOPLASTY WITH STENT PLACEMENT  approx 2 years ago  . PARTIAL NEPHRECTOMY Left   . SPLENECTOMY      Family History:  Family History  Problem Relation Age of Onset  . Heart failure Mother   . Hypertension Mother   . Diabetes Mellitus II Brother     Social History:  reports that he has been smoking Cigarettes.  He has been smoking about 1.00 pack per day. He has never used smokeless tobacco. He reports that he uses drugs. He reports that he does not drink alcohol.  Additional Social History:  Alcohol / Drug Use Pain Medications: SEE MAR Prescriptions: SEE MAR Over the Counter: SEE MAR History of alcohol / drug use?: Yes Longest period of sobriety (when/how long): x 4 days during detox Negative Consequences of Use: Financial, Legal, Personal relationships, Work / School Withdrawal Symptoms: Patient aware of relationship between substance abuse and physical/medical complications Substance #1 Name of Substance 1: opiates (Percoet) 1 - Age of First Use: 44 1 - Amount (size/oz): x 5 10 mg.  1 - Frequency: daily 1 - Duration: years 1 - Last Use / Amount: 03/26/16 x  5 10 mg.  CIWA: CIWA-Ar BP: 123/78 Pulse Rate: 95 COWS:    PATIENT STRENGTHS: (choose at least two) Active sense of humor Average or above average intelligence Capable of independent living  Allergies: No Known Allergies  Home Medications:  (Not in a hospital admission)  OB/GYN Status:  No LMP for male patient.  General Assessment Data Location of Assessment: [facility] ED TTS Assessment: In system Is this a Tele or Face-to-Face Assessment?: Face-to-Face Is this  an Initial Assessment or a Re-assessment for this encounter?: Initial Assessment Marital status: Single Maiden name: n/a Is patient pregnant?: No Pregnancy Status: No Living Arrangements: Other (Comment) (homeless) Can pt return to current living arrangement?: Yes Admission Status: Voluntary Is patient capable of signing voluntary admission?: Yes Referral Source: Self/Family/Friend Insurance type: [employer]     Crisis Care Plan Living Arrangements: Other (Comment) (homeless) Name of Psychiatrist: none Name of Therapist: none  Education Status Is patient currently in school?: No Current Grade: n/a Highest grade of school patient has completed: 9th Name of school: n/a Contact person: none given  Risk to self with the past 6 months Suicidal Ideation: No Has patient been a risk to self within the past 6 months prior to admission? : No Suicidal Intent: No Has patient had any suicidal intent within the past 6 months prior to admission? : No Is patient at risk for suicide?: No Suicidal Plan?: No Has patient had any suicidal plan within the past 6 months prior to admission? : No Access to Means: No What has been your use of drugs/alcohol within the last 12 months?: Opiate Use Severe Previous Attempts/Gestures: No How many times?: 0 Other Self Harm Risks: none noted Triggers for Past Attempts: None known Intentional Self Injurious Behavior: None Family Suicide History: No Recent stressful life event(s): Turmoil (Comment) Persecutory voices/beliefs?: No Depression: No Substance abuse history and/or treatment for substance abuse?: Yes Suicide prevention information given to non-admitted patients: Not applicable  Risk to Others within the past 6 months Homicidal Ideation: No Does patient have any lifetime risk of violence toward others beyond the six months prior to admission? : No Thoughts of Harm to Others: No Current Homicidal Intent: No Current Homicidal Plan: No Access  to Homicidal Means: No Identified Victim: none History of harm to others?: No Assessment of Violence: None Noted Violent Behavior Description: none noted Does patient have access to weapons?: No Criminal Charges Pending?: No Does patient have a court date: No Is patient on probation?: No  Psychosis Hallucinations: None noted Delusions: None noted  Mental Status Report Appearance/Hygiene: Unremarkable Eye Contact: Good Motor Activity: Freedom of movement Speech: Logical/coherent Level of Consciousness: Alert Mood: Ambivalent Affect: Euphoric Anxiety Level: Moderate Thought Processes: Coherent, Relevant Judgement: Impaired Orientation: Person, Place, Time, Situation, Appropriate for developmental age Obsessive Compulsive Thoughts/Behaviors: Minimal  Cognitive Functioning Concentration: Decreased Memory: Recent Intact, Remote Intact IQ: Average Insight: Poor Impulse Control: Poor Appetite: Fair Weight Loss: 0 Weight Gain: 0 Sleep: No Change Total Hours of Sleep: 5 Vegetative Symptoms: None  ADLScreening [facility] Assessment Services) Patient's cognitive ability adequate to safely complete daily activities?: Yes Patient able to express need for assistance with ADLs?: Yes Independently performs ADLs?: Yes (appropriate for developmental age)  Prior Inpatient Therapy Prior Inpatient Therapy: No Prior Therapy Dates: n/a Prior Therapy Facilty/Provider(s): n/a Reason for Treatment: n/a  Prior Outpatient Therapy Prior Outpatient Therapy: No Prior Therapy Dates: n/a Prior Therapy Facilty/Provider(s): n/a Reason for Treatment: n/a Does patient have an ACCT team?: No Does patient have Intensive  In-House Services?  : No Does patient have [facility] services? : No Does patient have P4CC services?: No  ADL Screening (condition at time of admission) Patient's cognitive ability adequate to safely complete daily activities?: Yes Is the patient deaf or have difficulty hearing?:  No Does the patient have difficulty seeing, even when wearing glasses/contacts?: No Does the patient have difficulty concentrating, remembering, or making decisions?: No Patient able to express need for assistance with ADLs?: Yes Does the patient have difficulty dressing or bathing?: No Independently performs ADLs?: Yes (appropriate for developmental age) Does the patient have difficulty walking or climbing stairs?: No Weakness of Legs: None Weakness of Arms/Hands: None  Home Assistive Devices/Equipment Home Assistive Devices/Equipment: None    Abuse/Neglect Assessment (Assessment to be complete while patient is alone) Physical Abuse: Denies Verbal Abuse: Denies Sexual Abuse: Denies Exploitation of patient/patient's resources: Denies Self-Neglect: Denies Values / Beliefs Cultural Requests During Hospitalization: None Spiritual Requests During Hospitalization: None   Advance Directives (For Healthcare) Does patient have an advance directive?: No Would patient like information on creating an advanced directive?: No - patient declined information    Additional Information 1:1 In Past 12 Months?: No CIRT Risk: No Elopement Risk: No Does patient have medical clearance?: No     Disposition:  Disposition Initial Assessment Completed for this Encounter: Yes Disposition of Patient: Other dispositions (TBD)  Elsie LincolnShean K Elmor Kost 03/27/2016 3:47 PM

## 2016-03-27 NOTE — ED Notes (Signed)
Pt alert and oriented X4, active, cooperative, pt in NAD. RR even and unlabored, color WNL.  Pt informed to return if any life threatening symptoms occur.   

## 2016-03-27 NOTE — ED Provider Notes (Signed)
-----------------------------------------   4:35 PM on 03/27/2016 -----------------------------------------  Patient care assumed from Dr. Don PerkingVeronese, unfortunately [facility] does not have any beds available for the patient. We will discharge with Phenergan and clonidine. The patient states he'll follow back up with [facility] at 10:00 tomorrow morning. Patient is agreeable to this plan.   Minna AntisKevin Karolynn Infantino, MD 03/27/16 [phone]

## 2016-03-27 NOTE — ED Triage Notes (Signed)
[facility] for detox from opiates.  Also says has leg cramping for 3 weeks.  Also light headed on an off for couple weeks.  Last opiate use yesterday.

## 2016-03-27 NOTE — ED Notes (Signed)
Pt back in stretcher at this time.   

## 2016-03-27 NOTE — ED Notes (Signed)
Pt was given a Malawiturkey sandwitch tray and a sprite with TEFL teacherN approval

## 2016-03-27 NOTE — ED Notes (Signed)
MD at bedside. 

## 2016-03-30 ENCOUNTER — Encounter: Payer: Self-pay | Admitting: Emergency Medicine

## 2016-03-30 ENCOUNTER — Emergency Department
Admission: EM | Admit: 2016-03-30 | Discharge: 2016-03-30 | Disposition: A | Discharge status: Home / Self Care | Payer: No Typology Code available for payment source | Attending: Student | Admitting: Student

## 2016-03-30 DIAGNOSIS — I252 Old myocardial infarction: Secondary | ICD-10-CM | POA: Insufficient documentation

## 2016-03-30 DIAGNOSIS — I509 Heart failure, unspecified: Secondary | ICD-10-CM | POA: Insufficient documentation

## 2016-03-30 DIAGNOSIS — I2510 Atherosclerotic heart disease of native coronary artery without angina pectoris: Secondary | ICD-10-CM | POA: Insufficient documentation

## 2016-03-30 DIAGNOSIS — Z79899 Other long term (current) drug therapy: Secondary | ICD-10-CM | POA: Insufficient documentation

## 2016-03-30 DIAGNOSIS — F1193 Opioid use, unspecified with withdrawal: Secondary | ICD-10-CM

## 2016-03-30 DIAGNOSIS — I110 Hypertensive heart disease with heart failure: Secondary | ICD-10-CM | POA: Insufficient documentation

## 2016-03-30 DIAGNOSIS — Z7982 Long term (current) use of aspirin: Secondary | ICD-10-CM | POA: Insufficient documentation

## 2016-03-30 DIAGNOSIS — F17210 Nicotine dependence, cigarettes, uncomplicated: Secondary | ICD-10-CM | POA: Insufficient documentation

## 2016-03-30 DIAGNOSIS — F1123 Opioid dependence with withdrawal: Secondary | ICD-10-CM | POA: Insufficient documentation

## 2016-03-30 DIAGNOSIS — F1910 Other psychoactive substance abuse, uncomplicated: Secondary | ICD-10-CM | POA: Insufficient documentation

## 2016-03-30 MED ORDER — ONDANSETRON 4 MG PO TBDP
ORAL_TABLET | ORAL | Status: AC
  Administered 2016-03-30: 4 mg via ORAL
  Filled 2016-03-30: qty 1

## 2016-03-30 MED ORDER — ONDANSETRON 4 MG PO TBDP
4.0000 mg | ORAL_TABLET | Freq: Once | ORAL | Status: AC
  Administered 2016-03-30: 4 mg via ORAL

## 2016-03-30 MED ORDER — IBUPROFEN 600 MG PO TABS
600.0000 mg | ORAL_TABLET | Freq: Once | ORAL | Status: AC
  Administered 2016-03-30: 600 mg via ORAL
  Filled 2016-03-30: qty 1

## 2016-03-30 NOTE — ED Notes (Signed)
Pt eloped at approximately 2130; pt had left to sit on the toilet for extended periods and then would return unnoticed.  Pt absence noticed permanent approximately 2215.  Pt left pack of cigarettes and purchased Coke Cola bottle on bed further confounding detection as pt reports being homeless and without money.

## 2016-03-30 NOTE — ED Notes (Signed)
Pt initially refused meds, "I can get that (IBU) anywhere, I need some real stuff", pt told IBU or acetaminophen would be the med of prefence for pain control and that narcotic pain interventions wouldn't be considered d/t pt hx of abuse.

## 2016-03-30 NOTE — ED Notes (Signed)
[facility] called, no room for pt at facility, [facility] Shalita and Dr Huel CoteQuigley notified

## 2016-03-30 NOTE — ED Notes (Signed)
Pt nauseated; dry heaving; Dr Huel CoteQuigley notified, orders received

## 2016-03-30 NOTE — BHH Counselor (Addendum)
This Clinical research associatewriter called [facility] to make second attempt to get detox placement for pt. Spoke with Mindy (Intake RN) who reports pt will not be accepted for Tx due to previous Tx Hx with their facility. Pt also did not follow through with follow-up treatment recommendation upon discharge from [facility].  This Clinical research associatewriter also called pt's sister Efraim Kaufmann(Melissa: [phone]) (per pt approval) - No answer. It appears that the phone would ring a few times and then sent to voicemail.   Pt was given community resources from previous TTS shift Danelle Earthly(Noel, RN handed resources to pt). Pt reports his last use to have been Tuesday (4 days ago - Percocet). There were no observed withdrawal symptoms. Pt's main concern appears to be his lack of housing. Pt also appears to have high ambivalence regarding his addiction.  This Clinical research associatewriter also provided pt with local suboxone clinics to seek outpatient medication assistance treatment. Pt did not appear to be very receptive to this option of treatment.

## 2016-03-30 NOTE — BH Assessment (Signed)
Writer faxed referral to [facility]. Writer consulted with the ER MD and informed her that, per Dr. Toni Amendlapacs the patient does not meet criteria for inpatient hospitalization.  Writer provided the patient with outpatient resources for the patient in the event that [facility] is not able to accept the patient.  [facility] and he is abel to return there if needed.

## 2016-03-30 NOTE — ED Provider Notes (Signed)
-----------------------------------------   10:09 PM on 03/30/2016 -----------------------------------------   Blood pressure 123/71, pulse 89, temperature 98.1 F (36.7 C), temperature source Oral, resp. rate 18, height 5\' 9"  (1.753 m), weight 195 lb (88.5 kg), SpO2 98 %.  Assuming care from Dr. Inocencio HomesGayle.  In short, Brian Porter is a 50 y.o. male with a chief complaint of Medical Clearance .  Refer to the original H&P for additional details.  The current plan of care is to wait for the patient on whether or not he'll be accepted to [facility]. Patient has been ambulatory here and only complains of some occasional nausea. Tissues are surrounded by substance abuse. He seems to be fully recovered from any kind of withdrawal situation. [facility] is declined on excepting the patient at this time. We felt we had no further offerings from the emergency department the patient be discharged to follow up on an outpatient basis for drug treatment    Jennye MoccasinBrian S Nevada Mullett, MD 03/30/16 2210

## 2016-03-30 NOTE — BH Assessment (Signed)
Assessment Note  Brian Porter is a 50 year old white male that requests detox services.  Patient reports that his last use was on 03-27-2016.   Patient reports that he takes 30-40 mg daily of Percocet. Has been doing this for 7 years. First started taking after surgery. He usually buys it from the street.  Patient reports abusing opiates daily.  Patient denies SI/HI/Psychosis.  Patient reports prior treatment at [facility] but he cannot remember the date.  Patient denies a history of seizures.  Patient reports experiencing some withdrawal symptoms.   Patient denies any family supports.  Patient reports living in the [facility].  Patient reports that he wants to get his life turned around.  Patient denies prior inpatient psychiatric hospitalization.    Diagnosis: Opiate Abuse  Past Medical History:  Past Medical History:  Diagnosis Date  . CAD (coronary artery disease)   . GERD (gastroesophageal reflux disease)   . Hypertension   . MI, old   . Sleep apnea     Past Surgical History:  Procedure Laterality Date  . CARDIAC CATHETERIZATION Right 10/16/2015   Procedure: Left Heart Cath and Coronary Angiography;  Surgeon: Laurier Nancy, MD;  Location: [facility] INVASIVE CV LAB;  Service: Cardiovascular;  Laterality: Right;  . CORONARY ANGIOPLASTY WITH STENT PLACEMENT  approx 2 years ago  . PARTIAL NEPHRECTOMY Left   . SPLENECTOMY      Family History:  Family History  Problem Relation Age of Onset  . Heart failure Mother   . Hypertension Mother   . Diabetes Mellitus II Brother     Social History:  reports that he has been smoking Cigarettes.  He has been smoking about 1.00 pack per day. He has never used smokeless tobacco. He reports that he uses drugs. He reports that he does not drink alcohol.  Additional Social History:  Alcohol / Drug Use History of alcohol / drug use?: Yes Negative Consequences of Use: Financial, Personal relationships, Work / School Withdrawal Symptoms:  Sweats, Fever / Chills, Weakness, Cramps Substance #1 Name of Substance 1: Opiates (Percoet) 1 - Age of First Use: 44 1 - Amount (size/oz): X5 (10 mg) 1 - Frequency: Daily 1 - Duration: 5 years 1 - Last Use / Amount: 03-26-2016  - patient reports taking 5 tables of 10mg  Percocet  CIWA: CIWA-Ar BP: 123/71 Pulse Rate: 89 COWS:    Allergies: No Known Allergies  Home Medications:  (Not in a hospital admission)  OB/GYN Status:  No LMP for male patient.  General Assessment Data Location of Assessment: [facility] ED TTS Assessment: In system Is this a Tele or Face-to-Face Assessment?: Face-to-Face Is this an Initial Assessment or a Re-assessment for this encounter?: Initial Assessment Marital status: Single Maiden name: NA Is patient pregnant?: No Pregnancy Status: No Living Arrangements:  (Living in the [facility] ) Can pt return to current living arrangement?: Yes Admission Status: Voluntary Is patient capable of signing voluntary admission?: Yes Referral Source: Self/Family/Friend Insurance type: Dealer Exam [facility] Walk-in ONLY) Medical Exam completed:  (NA)  Crisis Care Plan Living Arrangements:  (Living in the homeless [facility] ) Legal Guardian:  (NA) Name of Psychiatrist: None Reported Name of Therapist: None Reported  Education Status Is patient currently in school?: No Current Grade: NA Highest grade of school patient has completed: 9th Name of school: NA Contact person: NA  Risk to self with the past 6 months Suicidal Ideation: No Has patient been a risk  to self within the past 6 months prior to admission? : No Suicidal Intent: No Has patient had any suicidal intent within the past 6 months prior to admission? : No Is patient at risk for suicide?: No Suicidal Plan?: No Has patient had any suicidal plan within the past 6 months prior to admission? : No Access to Means: No What has been your use of drugs/alcohol within  the last 12 months?: NA Previous Attempts/Gestures: No How many times?: 0 Other Self Harm Risks: NA Triggers for Past Attempts: None known Intentional Self Injurious Behavior: None Family Suicide History: No Recent stressful life event(s): Other (Comment) (Unable to stop using) Persecutory voices/beliefs?: No Depression: No Depression Symptoms: Despondent, Loss of interest in usual pleasures, Feeling worthless/self pity Substance abuse history and/or treatment for substance abuse?: Yes Suicide prevention information given to non-admitted patients: Not applicable  Risk to Others within the past 6 months Homicidal Ideation: No Does patient have any lifetime risk of violence toward others beyond the six months prior to admission? : No Thoughts of Harm to Others: No Current Homicidal Intent: No Current Homicidal Plan: No Access to Homicidal Means: No Identified Victim: NA History of harm to others?: No Assessment of Violence: None Noted Violent Behavior Description: NA Does patient have access to weapons?: No Criminal Charges Pending?: No Does patient have a court date: No Is patient on probation?: No  Psychosis Hallucinations: None noted Delusions: None noted  Mental Status Report Appearance/Hygiene: Unremarkable Eye Contact: Good Motor Activity: Freedom of movement Speech: Logical/coherent Level of Consciousness: Alert Mood: Anxious Affect: Appropriate to circumstance Anxiety Level: None Thought Processes: Coherent, Relevant Judgement: Unimpaired Orientation: Person, Place, Time, Situation, Appropriate for developmental age Obsessive Compulsive Thoughts/Behaviors: None  Cognitive Functioning Concentration: Normal Memory: Recent Intact, Remote Intact IQ: Average Insight: Fair Impulse Control: Fair Appetite: Fair Weight Loss: 0 Weight Gain: 0 Sleep: Decreased Total Hours of Sleep: 5 Vegetative Symptoms: Decreased grooming, Staying in bed, Not  bathing  ADLScreening [facility] Assessment Services) Patient's cognitive ability adequate to safely complete daily activities?: Yes Patient able to express need for assistance with ADLs?: Yes Independently performs ADLs?: Yes (appropriate for developmental age)  Prior Inpatient Therapy Prior Inpatient Therapy: Yes Prior Therapy Dates: 2017 Prior Therapy Facilty/Provider(s): [facility] Reason for Treatment: Substance Abuse   Prior Outpatient Therapy Prior Outpatient Therapy: No Prior Therapy Dates: NA Prior Therapy Facilty/Provider(s): NA Reason for Treatment: NA Does patient have an ACCT team?: No Does patient have Intensive In-House Services?  : No Does patient have [facility] services? : No Does patient have P4CC services?: No  ADL Screening (condition at time of admission) Patient's cognitive ability adequate to safely complete daily activities?: Yes Is the patient deaf or have difficulty hearing?: No Does the patient have difficulty seeing, even when wearing glasses/contacts?: No Does the patient have difficulty concentrating, remembering, or making decisions?: No Patient able to express need for assistance with ADLs?: Yes Does the patient have difficulty dressing or bathing?: No Independently performs ADLs?: Yes (appropriate for developmental age) Does the patient have difficulty walking or climbing stairs?: No Weakness of Legs: None Weakness of Arms/Hands: None  Home Assistive Devices/Equipment Home Assistive Devices/Equipment: None    Abuse/Neglect Assessment (Assessment to be complete while patient is alone) Physical Abuse: Denies Verbal Abuse: Denies Sexual Abuse: Denies Exploitation of patient/patient's resources: Denies Self-Neglect: Denies Values / Beliefs Cultural Requests During Hospitalization: None Spiritual Requests During Hospitalization: None Consults Spiritual Care Consult Needed: No Social Work Consult Needed: No Merchant navy officerAdvance Directives (For Healthcare) Does  patient have an advance directive?: No Would patient like information on creating an advanced directive?: No - patient declined information    Additional Information 1:1 In Past 12 Months?: No CIRT Risk: No Elopement Risk: No Does patient have medical clearance?: Yes     Disposition: Per Dr. Toni Amend patient can be referred to [facility] for substance abuse treatment.  Disposition Initial Assessment Completed for this Encounter: Yes  On Site Evaluation by:   Reviewed with Physician:    Phillip Heal LaVerne 03/30/2016 3:33 PM

## 2016-03-30 NOTE — Discharge Instructions (Signed)
Please return immediately if condition worsens. Please contact her primary physician or the physician you were given for referral. If you have any specialist physicians involved in her treatment and plan please also contact them. Thank you for using [facility]. ° °

## 2016-03-30 NOTE — BH Assessment (Signed)
Per Dr. Toni Amendlapacs, patient does not meet criteria for psychiatric inpatient hospitalization.  Patient will be referred to [facility] for substance abuse treatment.  Writer faxed referral to [facility] for placement.

## 2016-03-30 NOTE — ED Notes (Addendum)
Waiting on MD exam

## 2016-03-30 NOTE — ED Triage Notes (Signed)
Requests detox for opium use.

## 2016-03-30 NOTE — ED Provider Notes (Signed)
[facility] Emergency Department Provider Note   ____________________________________________   First MD Initiated Contact with Patient 03/30/16 1403     (approximate)  I have reviewed the triage vital signs and the nursing notes.   HISTORY  Chief Complaint Medical Clearance    HPI Brian Porter is a 50 y.o. male with a history of coronary artery disease, hypertension, and opiate abuse who presents requesting detox from opiates, long-standing use, gradual onset, constant, severe, no modifying factors. He last used opiates 3 days ago. He is currently complaining of some mild abdominal cramping but denies any diarrhea or vomiting. He was seen in this emergency department on 03/27/2016 requesting detox, he had reassuring lab work at that time, [facility] was considered however there was no bed available for him. He was supposed to follow-up the next day but never did. He denies any SI, HI or audiovisual hallucinations.   Past Medical History:  Diagnosis Date  . CAD (coronary artery disease)   . GERD (gastroesophageal reflux disease)   . Hypertension   . MI, old   . Sleep apnea     Patient Active Problem List   Diagnosis Date Noted  . HTN (hypertension) 11/02/2015  . Tobacco abuse 11/02/2015  . Hand joint pain 11/02/2015  . CHF (congestive heart failure) (HCC) 10/15/2015  . Chest pain 02/22/2015  . Chest pain at rest 02/22/2015    Past Surgical History:  Procedure Laterality Date  . CARDIAC CATHETERIZATION Right 10/16/2015   Procedure: Left Heart Cath and Coronary Angiography;  Surgeon: Laurier Nancy, MD;  Location: [facility] INVASIVE CV LAB;  Service: Cardiovascular;  Laterality: Right;  . CORONARY ANGIOPLASTY WITH STENT PLACEMENT  approx 2 years ago  . PARTIAL NEPHRECTOMY Left   . SPLENECTOMY      Prior to Admission medications   Medication Sig Start Date End Date Taking? Authorizing Provider  acetaminophen (TYLENOL) 325 MG tablet Take 2 tablets (650 mg  total) by mouth every 4 (four) hours as needed for headache or mild pain. 10/16/15   Ramonita Lab, MD  albuterol (PROVENTIL HFA) 108 (90 Base) MCG/ACT inhaler Inhale 2 puffs into the lungs every 4 (four) hours as needed for wheezing or shortness of breath. 09/05/15   Sharman Cheek, MD  aspirin EC 81 MG EC tablet Take 1 tablet (81 mg total) by mouth daily. 10/16/15   Ramonita Lab, MD  carvedilol (COREG) 6.25 MG tablet Take 1 tablet (6.25 mg total) by mouth 2 (two) times daily with a meal. 10/16/15   Ramonita Lab, MD  cephALEXin (KEFLEX) 500 MG capsule Take 1 capsule (500 mg total) by mouth 4 (four) times daily. 01/12/16   Delorise Royals Cuthriell, PA-C  cloNIDine (CATAPRES) 0.1 MG tablet Take 1 tablet (0.1 mg total) by mouth every 12 (twelve) hours as needed (withdrawal symptoms). 03/27/16 04/01/16  Minna Antis, MD  cyclobenzaprine (FLEXERIL) 10 MG tablet Take 1 tablet (10 mg total) by mouth every 8 (eight) hours as needed for muscle spasms. 07/31/15   Evangeline Dakin, PA-C  diclofenac (VOLTAREN) 75 MG EC tablet Take 1 tablet (75 mg total) by mouth 2 (two) times daily. 07/31/15   Charmayne Sheer Beers, PA-C  furosemide (LASIX) 20 MG tablet Take 1 tablet (20 mg total) by mouth daily. 11/28/15 11/27/16  Delma Freeze, FNP  lisinopril (ZESTRIL) 10 MG tablet Take 1 tablet (10 mg total) by mouth daily. 11/28/15   Delma Freeze, FNP  LORazepam (ATIVAN) 1 MG tablet Take 1 tablet (  1 mg total) by mouth 2 (two) times daily. 01/13/16 01/12/17  Jeanmarie Plant, MD  meloxicam (MOBIC) 15 MG tablet Take 1 tablet (15 mg total) by mouth daily. 01/12/16   Delorise Royals Cuthriell, PA-C  naproxen (NAPROSYN) 500 MG tablet Take 1 tablet (500 mg total) by mouth 2 (two) times daily with a meal. 09/05/15   Sharman Cheek, MD  ondansetron (ZOFRAN) 4 MG tablet Take 1 tablet (4 mg total) by mouth every 8 (eight) hours as needed for nausea or vomiting. 01/13/16   Jeanmarie Plant, MD  predniSONE (DELTASONE) 20 MG tablet Take 2 tablets (40 mg total) by  mouth daily. 09/05/15   Sharman Cheek, MD  promethazine (PHENERGAN) 25 MG tablet Take 1 tablet (25 mg total) by mouth every 6 (six) hours as needed for nausea or vomiting. 03/27/16   Minna Antis, MD  ranitidine (ZANTAC) 150 MG tablet Take 150 mg by mouth 2 (two) times daily as needed for heartburn.    Historical Provider, MD  spironolactone (ALDACTONE) 25 MG tablet Take 1 tablet (25 mg total) by mouth daily. 11/28/15   Delma Freeze, FNP  traMADol (ULTRAM) 50 MG tablet Take 1 tablet (50 mg total) by mouth every 6 (six) hours as needed. 08/06/15   Jennye Moccasin, MD    Allergies Review of patient's allergies indicates no known allergies.  Family History  Problem Relation Age of Onset  . Heart failure Mother   . Hypertension Mother   . Diabetes Mellitus II Brother     Social History Social History  Substance Use Topics  . Smoking status: Current Every Day Smoker    Packs/day: 1.00    Types: Cigarettes  . Smokeless tobacco: Never Used  . Alcohol use No    Review of Systems Constitutional: No fever/chills Eyes: No visual changes. ENT: No sore throat. Cardiovascular: Denies chest pain. Respiratory: Denies shortness of breath. Gastrointestinal: +mild abdominal cramping.  No nausea, no vomiting.  No diarrhea.  No constipation. Genitourinary: Negative for dysuria. Musculoskeletal: Negative for back pain. Skin: Negative for rash. Neurological: Negative for headaches, focal weakness or numbness.  10-point ROS otherwise negative.  ____________________________________________   PHYSICAL EXAM:  VITAL SIGNS: ED Triage Vitals  Enc Vitals Group     BP 03/30/16 1256 123/71     Pulse Rate 03/30/16 1256 89     Resp 03/30/16 1256 18     Temp 03/30/16 1256 98.1 F (36.7 C)     Temp Source 03/30/16 1256 Oral     SpO2 03/30/16 1256 98 %     Weight 03/30/16 1257 195 lb (88.5 kg)     Height 03/30/16 1257 5\' 9"  (1.753 m)     Head Circumference --      Peak Flow --      Pain  Score 03/30/16 1257 8     Pain Loc --      Pain Edu? --      Excl. in GC? --     Constitutional: Alert and oriented. Well appearing and in no acute distress. Eyes: Conjunctivae are normal. PERRL. EOMI. Head: Atraumatic. Nose: No congestion/rhinnorhea. Mouth/Throat: Mucous membranes are moist.  Oropharynx non-erythematous. Neck: No stridor. Supple without meningismus. Cardiovascular: Normal rate, regular rhythm. Grossly normal heart sounds.  Good peripheral circulation. Respiratory: Normal respiratory effort.  No retractions. Lungs CTAB. Gastrointestinal: Soft and nontender. No distention. Normal bowel sounds. No CVA tenderness. Genitourinary: deferred Musculoskeletal: No lower extremity tenderness nor edema.  No joint effusions. Neurologic:  Normal  speech and language. No gross focal neurologic deficits are appreciated. No gait instability. Skin:  Skin is warm, dry and intact. No rash noted. Psychiatric: Mood and affect are normal. Speech and behavior are normal.  ____________________________________________   LABS (all labs ordered are listed, but only abnormal results are displayed)  Labs Reviewed - No data to display ____________________________________________  EKG  none ____________________________________________  RADIOLOGY  none ____________________________________________   PROCEDURES  Procedure(s) performed: None  Procedures  Critical Care performed: No  ____________________________________________   INITIAL IMPRESSION / ASSESSMENT AND PLAN / ED COURSE  Pertinent labs & imaging results that were available during my care of the patient were reviewed by me and considered in my medical decision making (see chart for details).  [name] is a 50 y.o. male with a history of coronary artery disease, hypertension, and opiate abuse who presents requesting detox from opiates, long-standing use, gradual onset, constant, severe, no modifying factors. On  exam, he is well-appearing in no acute distress. Sleeping but awakens to voice and light touch. His vital signs are stable, he is afebrile. He has a benign physical exam and unremarkable abdominal exam rebound, guarding, rigidity. Reviewed his labs from 3 days ago which were generally reassuring, no indication for repeat lab work. I discussed the case with [facility] to evaluate the patient.  ----------------------------------------- 3:58 PM on 03/30/2016 -----------------------------------------  TTS has evaluated the patient and discussed case with [facility] who is reviewing his paperwork. He may be accepted there today, if not he has received outpatient/community resources for detox and can likely be discharged if he continues to appear well.  Clinical Course     ____________________________________________   FINAL CLINICAL IMPRESSION(S) / ED DIAGNOSES  Final diagnoses:  Opiate withdrawal (HCC)      NEW MEDICATIONS STARTED DURING THIS VISIT:  New Prescriptions   No medications on file     Note:  This document was prepared using Dragon voice recognition software and may include unintentional dictation errors.    Gayla DossEryka A Yasmine Kilbourne, MD 03/30/16 [id number]

## 2016-08-27 ENCOUNTER — Encounter: Payer: Self-pay | Admitting: Emergency Medicine

## 2016-08-27 ENCOUNTER — Inpatient Hospital Stay
Admission: EM | Admit: 2016-08-27 | Discharge: 2016-08-29 | DRG: 293 | Disposition: A | Discharge status: Home / Self Care | Payer: Self-pay | Attending: Specialist | Admitting: Specialist

## 2016-08-27 ENCOUNTER — Emergency Department: Payer: No Typology Code available for payment source

## 2016-08-27 DIAGNOSIS — Z79899 Other long term (current) drug therapy: Secondary | ICD-10-CM

## 2016-08-27 DIAGNOSIS — I5023 Acute on chronic systolic (congestive) heart failure: Secondary | ICD-10-CM

## 2016-08-27 DIAGNOSIS — Z905 Acquired absence of kidney: Secondary | ICD-10-CM

## 2016-08-27 DIAGNOSIS — Z9081 Acquired absence of spleen: Secondary | ICD-10-CM

## 2016-08-27 DIAGNOSIS — G4733 Obstructive sleep apnea (adult) (pediatric): Secondary | ICD-10-CM | POA: Diagnosis present

## 2016-08-27 DIAGNOSIS — Z91120 Patient's intentional underdosing of medication regimen due to financial hardship: Secondary | ICD-10-CM

## 2016-08-27 DIAGNOSIS — K219 Gastro-esophageal reflux disease without esophagitis: Secondary | ICD-10-CM | POA: Diagnosis present

## 2016-08-27 DIAGNOSIS — I110 Hypertensive heart disease with heart failure: Principal | ICD-10-CM | POA: Diagnosis present

## 2016-08-27 DIAGNOSIS — M791 Myalgia: Secondary | ICD-10-CM | POA: Diagnosis present

## 2016-08-27 DIAGNOSIS — I2510 Atherosclerotic heart disease of native coronary artery without angina pectoris: Secondary | ICD-10-CM | POA: Diagnosis present

## 2016-08-27 DIAGNOSIS — F1110 Opioid abuse, uncomplicated: Secondary | ICD-10-CM | POA: Diagnosis present

## 2016-08-27 DIAGNOSIS — G8929 Other chronic pain: Secondary | ICD-10-CM | POA: Diagnosis present

## 2016-08-27 DIAGNOSIS — T50996A Underdosing of other drugs, medicaments and biological substances, initial encounter: Secondary | ICD-10-CM | POA: Diagnosis present

## 2016-08-27 DIAGNOSIS — Z9114 Patient's other noncompliance with medication regimen: Secondary | ICD-10-CM

## 2016-08-27 DIAGNOSIS — Z955 Presence of coronary angioplasty implant and graft: Secondary | ICD-10-CM

## 2016-08-27 DIAGNOSIS — I255 Ischemic cardiomyopathy: Secondary | ICD-10-CM | POA: Diagnosis present

## 2016-08-27 DIAGNOSIS — I252 Old myocardial infarction: Secondary | ICD-10-CM

## 2016-08-27 DIAGNOSIS — Z8249 Family history of ischemic heart disease and other diseases of the circulatory system: Secondary | ICD-10-CM

## 2016-08-27 DIAGNOSIS — Z9119 Patient's noncompliance with other medical treatment and regimen: Secondary | ICD-10-CM

## 2016-08-27 DIAGNOSIS — Z7982 Long term (current) use of aspirin: Secondary | ICD-10-CM

## 2016-08-27 DIAGNOSIS — F17210 Nicotine dependence, cigarettes, uncomplicated: Secondary | ICD-10-CM | POA: Diagnosis present

## 2016-08-27 HISTORY — DX: Acute on chronic systolic (congestive) heart failure: I50.23

## 2016-08-27 LAB — BASIC METABOLIC PANEL
ANION GAP: 7 (ref 5–15)
BUN: 18 mg/dL (ref 6–20)
CALCIUM: 8.5 mg/dL — AB (ref 8.9–10.3)
CO2: 26 mmol/L (ref 22–32)
CREATININE: 0.85 mg/dL (ref 0.61–1.24)
Chloride: 107 mmol/L (ref 101–111)
Glucose, Bld: 107 mg/dL — ABNORMAL HIGH (ref 65–99)
Potassium: 4.6 mmol/L (ref 3.5–5.1)
Sodium: 140 mmol/L (ref 135–145)

## 2016-08-27 LAB — URINALYSIS, COMPLETE (UACMP) WITH MICROSCOPIC
BACTERIA UA: NONE SEEN
Bilirubin Urine: NEGATIVE
GLUCOSE, UA: NEGATIVE mg/dL
Ketones, ur: NEGATIVE mg/dL
Leukocytes, UA: NEGATIVE
NITRITE: NEGATIVE
PROTEIN: 30 mg/dL — AB
SPECIFIC GRAVITY, URINE: 1.025 (ref 1.005–1.030)
pH: 5 (ref 5.0–8.0)

## 2016-08-27 LAB — CBC
HEMATOCRIT: 40.6 % (ref 40.0–52.0)
Hemoglobin: 13.7 g/dL (ref 13.0–18.0)
MCH: 32.1 pg (ref 26.0–34.0)
MCHC: 33.8 g/dL (ref 32.0–36.0)
MCV: 95.1 fL (ref 80.0–100.0)
PLATELETS: 350 10*3/uL (ref 150–440)
RBC: 4.27 MIL/uL — ABNORMAL LOW (ref 4.40–5.90)
RDW: 14.8 % — AB (ref 11.5–14.5)
WBC: 11.4 10*3/uL — AB (ref 3.8–10.6)

## 2016-08-27 LAB — TROPONIN I: Troponin I: <0.03 ng/mL (ref <0.03)

## 2016-08-27 LAB — BRAIN NATRIURETIC PEPTIDE: B NATRIURETIC PEPTIDE 5: 1108 pg/mL — AB (ref 0.0–100.0)

## 2016-08-27 IMAGING — CR DG CHEST 2V
2 series · 2 of 2 positions shown · non-contrast
Comparison: Chest radiograph performed [DATE]

CLINICAL DATA: Acute onset of shortness of breath and productive
cough. Initial encounter.

EXAM:
CHEST  2 VIEW

[chest pa]
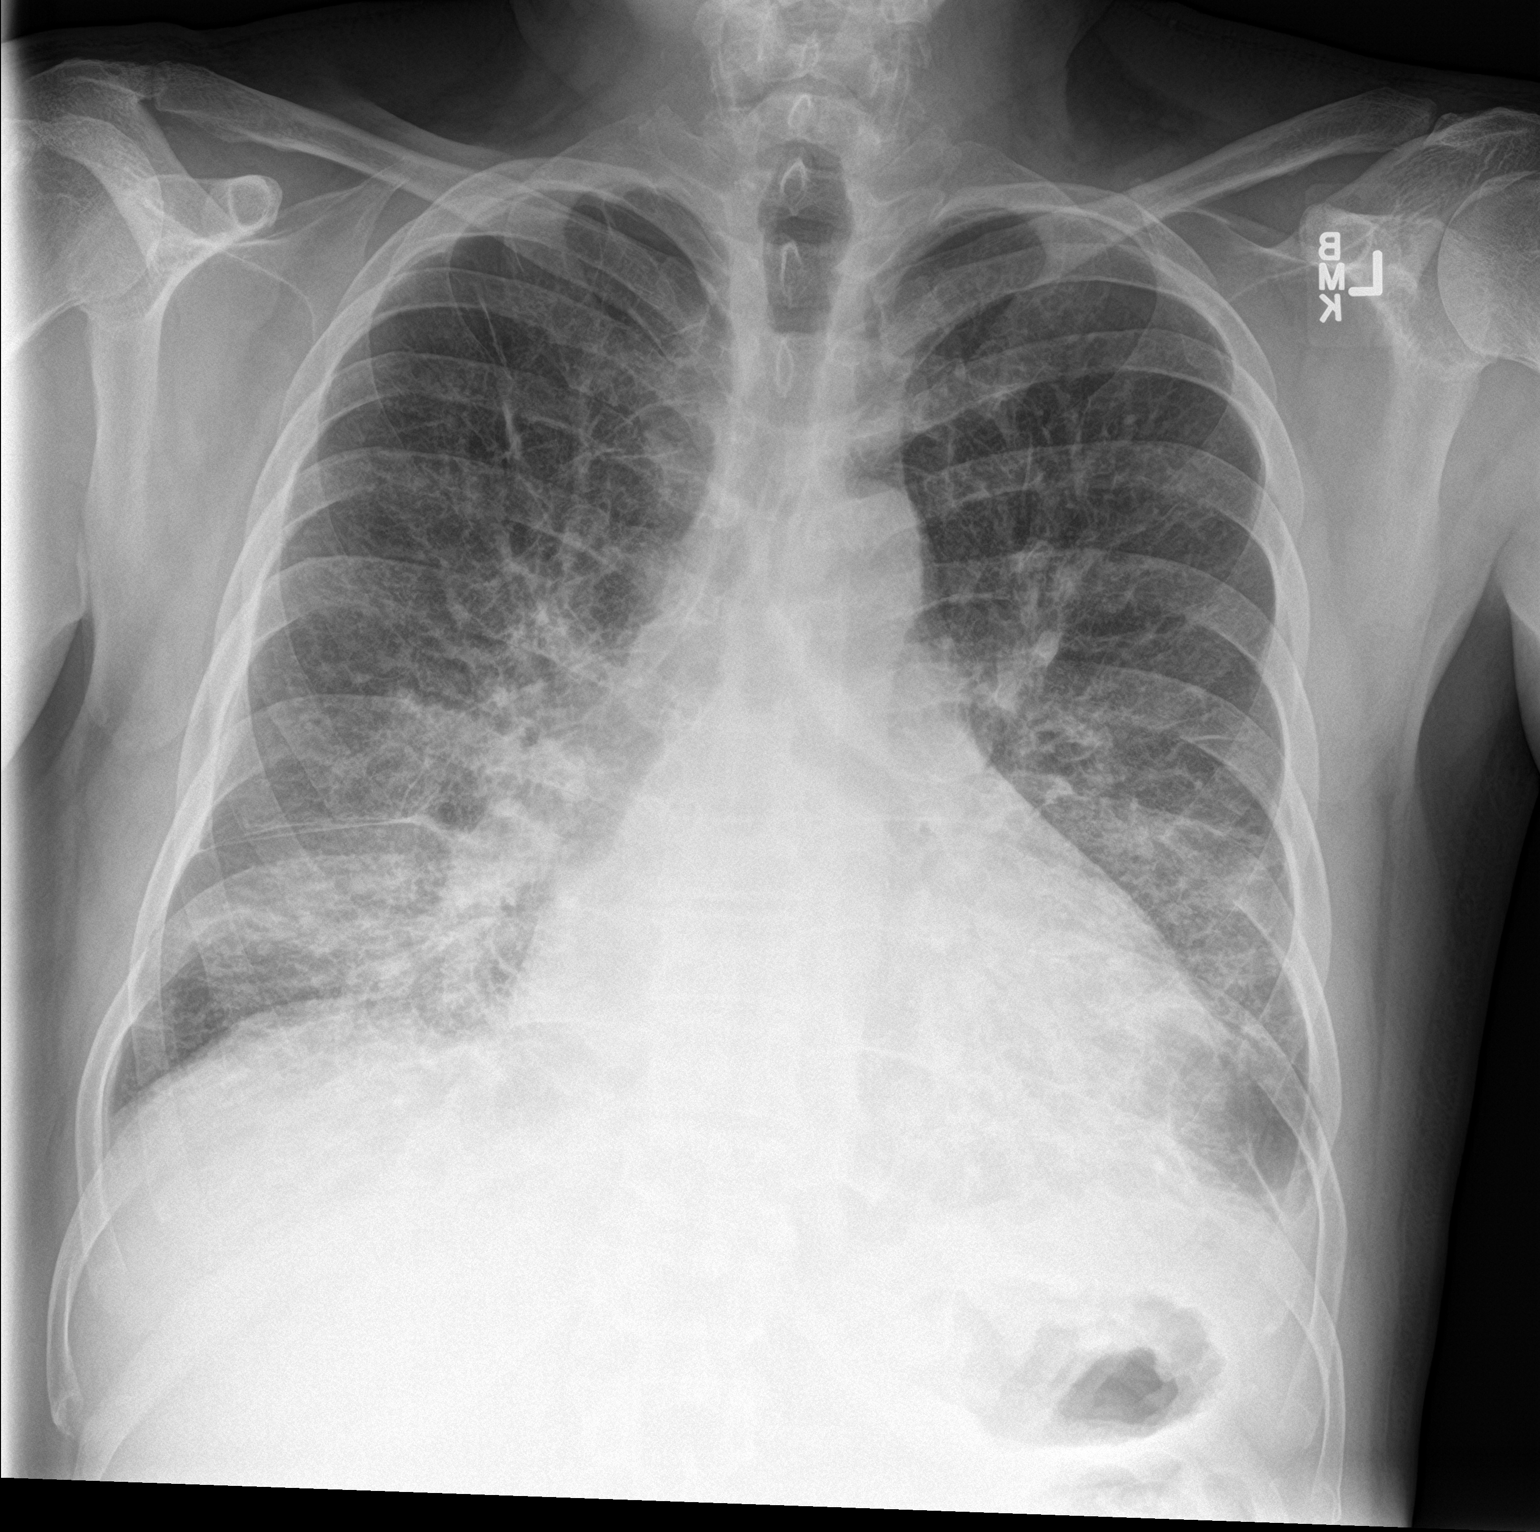

[chest lat]
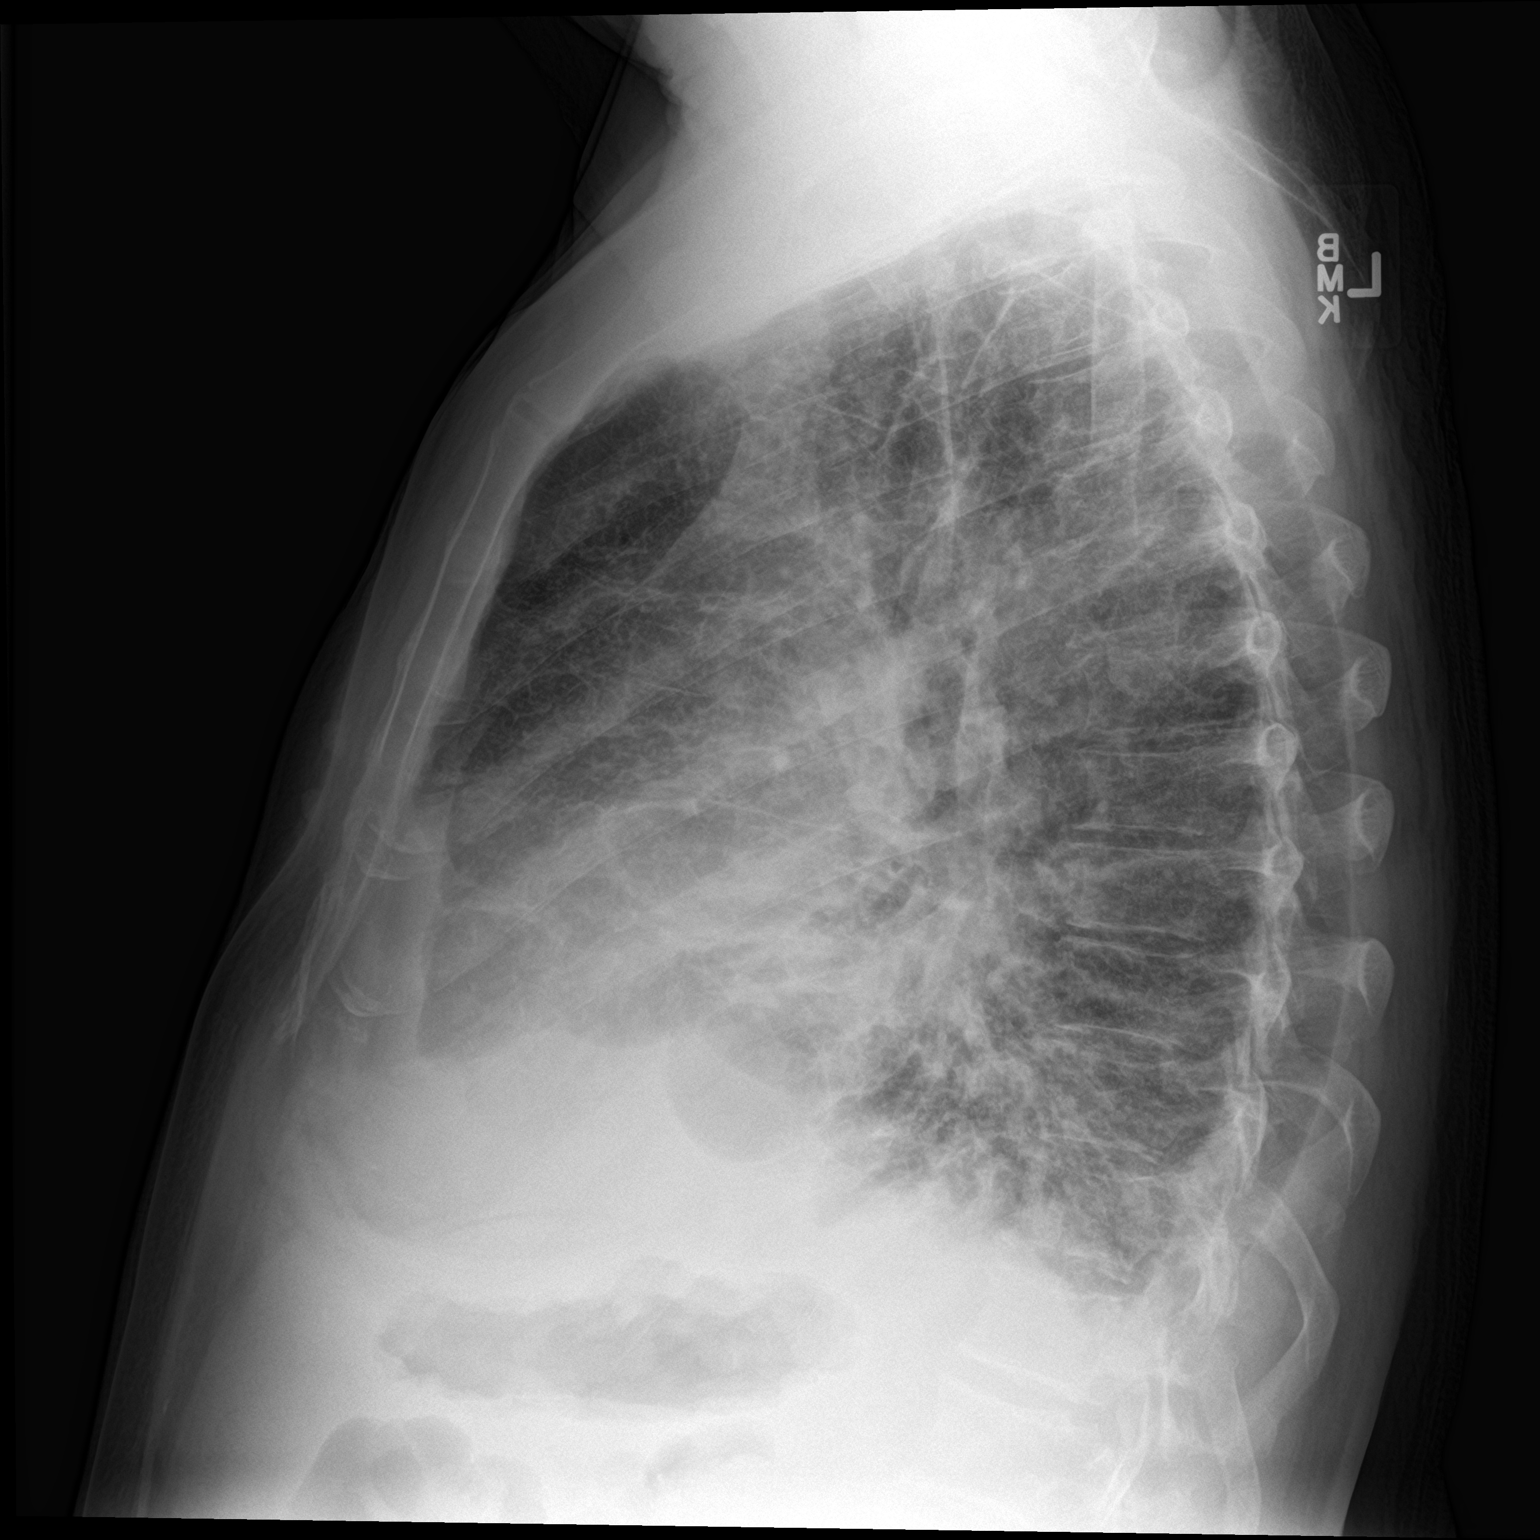

[2 of 2 positions shown; findings below may reference images not displayed]

FINDINGS: The lungs are well-aerated. Vascular congestion is again noted.
Bilateral central and bibasilar airspace opacities raise concern for
pulmonary edema. A small left pleural effusion is suspected. No
pneumothorax is seen.

The heart is mildly enlarged. No acute osseous abnormalities are
seen.
IMPRESSION: Vascular congestion and mild cardiomegaly. Bilateral central and
bibasilar airspace opacities raise concern for pulmonary edema.
Small left pleural effusion suspected.

## 2016-08-27 MED ORDER — NICOTINE 21 MG/24HR TD PT24
MEDICATED_PATCH | TRANSDERMAL | Status: AC
  Administered 2016-08-27: 21 mg via TRANSDERMAL
  Filled 2016-08-27: qty 1

## 2016-08-27 MED ORDER — ASPIRIN EC 81 MG PO TBEC
DELAYED_RELEASE_TABLET | ORAL | Status: AC
  Administered 2016-08-27: 81 mg via ORAL
  Filled 2016-08-27: qty 1

## 2016-08-27 MED ORDER — LISINOPRIL 10 MG PO TABS
10.0000 mg | ORAL_TABLET | Freq: Every day | ORAL | Status: DC
  Administered 2016-08-27 – 2016-08-29 (×3): 10 mg via ORAL
  Filled 2016-08-27 (×2): qty 1

## 2016-08-27 MED ORDER — CARVEDILOL 6.25 MG PO TABS
6.2500 mg | ORAL_TABLET | Freq: Two times a day (BID) | ORAL | Status: DC
  Administered 2016-08-27 – 2016-08-28 (×3): 6.25 mg via ORAL
  Filled 2016-08-27 (×3): qty 1

## 2016-08-27 MED ORDER — OXYCODONE-ACETAMINOPHEN 5-325 MG PO TABS
2.0000 | ORAL_TABLET | Freq: Once | ORAL | Status: AC
  Administered 2016-08-27: 2 via ORAL
  Filled 2016-08-27: qty 2

## 2016-08-27 MED ORDER — ONDANSETRON HCL 4 MG/2ML IJ SOLN: 4.0000 mg | Freq: Four times a day (QID) | INTRAMUSCULAR | Status: DC | PRN

## 2016-08-27 MED ORDER — DOCUSATE SODIUM 100 MG PO CAPS
ORAL_CAPSULE | ORAL | Status: AC
  Administered 2016-08-27: 100 mg via ORAL
  Filled 2016-08-27: qty 1

## 2016-08-27 MED ORDER — BISACODYL 5 MG PO TBEC: 5.0000 mg | DELAYED_RELEASE_TABLET | Freq: Every day | ORAL | Status: DC | PRN

## 2016-08-27 MED ORDER — FUROSEMIDE 10 MG/ML IJ SOLN
40.0000 mg | Freq: Two times a day (BID) | INTRAMUSCULAR | Status: DC
  Administered 2016-08-27 – 2016-08-28 (×4): 40 mg via INTRAVENOUS
  Filled 2016-08-27 (×3): qty 4

## 2016-08-27 MED ORDER — ASPIRIN EC 81 MG PO TBEC
81.0000 mg | DELAYED_RELEASE_TABLET | Freq: Every day | ORAL | Status: DC
Start: 2016-08-27 — End: 2016-08-29
  Administered 2016-08-27 – 2016-08-29 (×3): 81 mg via ORAL
  Filled 2016-08-27 (×2): qty 1

## 2016-08-27 MED ORDER — ENOXAPARIN SODIUM 40 MG/0.4ML ~~LOC~~ SOLN
40.0000 mg | SUBCUTANEOUS | Status: DC
  Administered 2016-08-27 – 2016-08-28 (×2): 40 mg via SUBCUTANEOUS
  Filled 2016-08-27: qty 0.4

## 2016-08-27 MED ORDER — FUROSEMIDE 10 MG/ML IJ SOLN
40.0000 mg | Freq: Once | INTRAMUSCULAR | Status: AC
  Administered 2016-08-27: 40 mg via INTRAVENOUS
  Filled 2016-08-27: qty 4

## 2016-08-27 MED ORDER — DOCUSATE SODIUM 100 MG PO CAPS
100.0000 mg | ORAL_CAPSULE | Freq: Two times a day (BID) | ORAL | Status: DC
  Administered 2016-08-27 – 2016-08-28 (×4): 100 mg via ORAL
  Filled 2016-08-27 (×4): qty 1

## 2016-08-27 MED ORDER — ONDANSETRON HCL 4 MG PO TABS: 4.0000 mg | ORAL_TABLET | Freq: Four times a day (QID) | ORAL | Status: DC | PRN

## 2016-08-27 MED ORDER — FUROSEMIDE 10 MG/ML IJ SOLN
INTRAMUSCULAR | Status: AC
  Administered 2016-08-27: 40 mg via INTRAVENOUS
  Filled 2016-08-27: qty 4

## 2016-08-27 MED ORDER — MORPHINE SULFATE (PF) 4 MG/ML IV SOLN
4.0000 mg | Freq: Once | INTRAVENOUS | Status: AC
  Administered 2016-08-27: 4 mg via INTRAVENOUS
  Filled 2016-08-27: qty 1

## 2016-08-27 MED ORDER — ACETAMINOPHEN 650 MG RE SUPP: 650.0000 mg | Freq: Four times a day (QID) | RECTAL | Status: DC | PRN

## 2016-08-27 MED ORDER — LISINOPRIL 10 MG PO TABS
ORAL_TABLET | ORAL | Status: AC
  Administered 2016-08-27: 10 mg via ORAL
  Filled 2016-08-27: qty 1

## 2016-08-27 MED ORDER — NICOTINE 21 MG/24HR TD PT24
21.0000 mg | MEDICATED_PATCH | Freq: Once | TRANSDERMAL | Status: AC
  Administered 2016-08-27: 21 mg via TRANSDERMAL

## 2016-08-27 MED ORDER — TRAZODONE HCL 50 MG PO TABS
25.0000 mg | ORAL_TABLET | Freq: Every evening | ORAL | Status: DC | PRN
  Administered 2016-08-28 – 2016-08-29 (×2): 25 mg via ORAL
  Filled 2016-08-27 (×2): qty 1

## 2016-08-27 MED ORDER — ENOXAPARIN SODIUM 40 MG/0.4ML ~~LOC~~ SOLN
SUBCUTANEOUS | Status: AC
  Administered 2016-08-27: 40 mg via SUBCUTANEOUS
  Filled 2016-08-27: qty 0.4

## 2016-08-27 MED ORDER — ACETAMINOPHEN 325 MG PO TABS
650.0000 mg | ORAL_TABLET | Freq: Four times a day (QID) | ORAL | Status: DC | PRN
Start: 2016-08-27 — End: 2016-08-29
  Administered 2016-08-27: 650 mg via ORAL
  Filled 2016-08-27: qty 2

## 2016-08-27 MED ORDER — SPIRONOLACTONE 25 MG PO TABS
25.0000 mg | ORAL_TABLET | Freq: Every day | ORAL | Status: DC
  Administered 2016-08-27 – 2016-08-29 (×3): 25 mg via ORAL
  Filled 2016-08-27 (×3): qty 1

## 2016-08-27 NOTE — ED Notes (Signed)
Pt requesting morphine or oxycodone at this time. MD Luberta MutterKonidena made aware. MD reported she will not be giving narcotics to patient. Patient made aware

## 2016-08-27 NOTE — ED Triage Notes (Signed)
Pt ambulatory to triage without difficulty or distress noted; pt reports SHOB x week with prod cough clear sputum; also c/o pain to left groin with swelling, st ?hernia

## 2016-08-27 NOTE — ED Notes (Signed)
Pt noted leaving lobby then returning smelling strongly of cigarette smoke

## 2016-08-27 NOTE — ED Provider Notes (Signed)
[facility] Emergency Department Provider Note        Time seen: ----------------------------------------- 8:14 AM on 08/27/2016 -----------------------------------------    I have reviewed the triage vital signs and the nursing notes.   HISTORY  Chief Complaint Shortness of Breath    HPI Brian Porter is a 51 y.o. male who presents to ER for shortness of breath for the last week. Patient says productive cough with clear sputum.Patient states he didn't take his medication for the past several months. When asked about Lasix patient replies "priest was sustained on that". Patient does have a diagnosis of congestive heart failure but does not take blood pressure occasions or diuretics. He denies recent illness but states he is having some left-sided groin pain as well. He denies fevers or chills.   Past Medical History:  Diagnosis Date  . CAD (coronary artery disease)   . GERD (gastroesophageal reflux disease)   . Hypertension   . MI, old   . Sleep apnea     Patient Active Problem List   Diagnosis Date Noted  . HTN (hypertension) 11/02/2015  . Tobacco abuse 11/02/2015  . Hand joint pain 11/02/2015  . CHF (congestive heart failure) (HCC) 10/15/2015  . Chest pain 02/22/2015  . Chest pain at rest 02/22/2015    Past Surgical History:  Procedure Laterality Date  . CARDIAC CATHETERIZATION Right 10/16/2015   Procedure: Left Heart Cath and Coronary Angiography;  Surgeon: Laurier NancyShaukat A Khan, MD;  Location: [facility] INVASIVE CV LAB;  Service: Cardiovascular;  Laterality: Right;  . CORONARY ANGIOPLASTY WITH STENT PLACEMENT  approx 2 years ago  . PARTIAL NEPHRECTOMY Left   . SPLENECTOMY      Allergies Patient has no known allergies.  Social History Social History  Substance Use Topics  . Smoking status: Current Every Day Smoker    Packs/day: 1.00    Types: Cigarettes  . Smokeless tobacco: Never Used  . Alcohol use No    Review of  Systems Constitutional: Negative for fever. Cardiovascular: Negative for chest pain. Respiratory: Positive for shortness of breath Gastrointestinal: Negative for abdominal pain, vomiting and diarrhea. Genitourinary: Negative for dysuria. positive for left-sided groin pain Musculoskeletal: Negative for back pain. Skin: Negative for rash. Neurological: Negative for headaches, focal weakness or numbness.  10-point ROS otherwise negative.  ____________________________________________   PHYSICAL EXAM:  VITAL SIGNS: ED Triage Vitals [08/27/16 0534]  Enc Vitals Group     BP (!) 129/93     Pulse Rate (!) 109     Resp (!) 22     Temp 97.9 F (36.6 C)     Temp Source Oral     SpO2 97 %     Weight 200 lb (90.7 kg)     Height 5\' 9"  (1.753 m)     Head Circumference      Peak Flow      Pain Score 7     Pain Loc      Pain Edu?      Excl. in GC?     Constitutional: Alert and oriented. No distress Eyes: Conjunctivae are normal. PERRL. Normal extraocular movements. ENT   Head: Normocephalic and atraumatic.   Nose: No congestion/rhinnorhea.   Mouth/Throat: Mucous membranes are moist.   Neck: No stridor. Cardiovascular: Normal rate, regular rhythm. No murmurs, rubs, or gallops. Respiratory: Normal respiratory effort without tachypnea nor retractions. Basilar rales Gastrointestinal: Soft and nontender. Normal bowel sounds Musculoskeletal: Nontender with normal range of motion in all extremities. No lower extremity  tenderness, mild edema Neurologic:  Normal speech and language. No gross focal neurologic deficits are appreciated.  Skin:  Skin is warm, dry and intact. No rash noted. Psychiatric: Mood and affect are normal. Speech and behavior are normal.  ____________________________________________  EKG: Interpreted by me. Sinus tachycardia with rate 103 bpm, normal PR interval, normal QT, septal infarct, left axis  deviation  ____________________________________________  ED COURSE:  Pertinent labs & imaging results that were available during my care of the patient were reviewed by me and considered in my medical decision making (see chart for details). Patient presents to the ER in no distress with dyspnea likely secondary to CHF. We will assess with labs and imaging.   Procedures ____________________________________________   LABS (pertinent positives/negatives)  Labs Reviewed  CBC - Abnormal; Notable for the following:       Result Value   WBC 11.4 (*)    RBC 4.27 (*)    RDW 14.8 (*)    All other components within normal limits  BASIC METABOLIC PANEL - Abnormal; Notable for the following:    Glucose, Bld 107 (*)    Calcium 8.5 (*)    All other components within normal limits  URINALYSIS, COMPLETE (UACMP) WITH MICROSCOPIC - Abnormal; Notable for the following:    Color, Urine YELLOW (*)    APPearance CLEAR (*)    Hgb urine dipstick SMALL (*)    Protein, ur 30 (*)    Squamous Epithelial / LPF 0-5 (*)    All other components within normal limits  BRAIN NATRIURETIC PEPTIDE - Abnormal; Notable for the following:    B Natriuretic Peptide 1,108.0 (*)    All other components within normal limits  TROPONIN I    RADIOLOGY Images were viewed by me  Chest x-ray IMPRESSION: Vascular congestion and mild cardiomegaly. Bilateral central and bibasilar airspace opacities raise concern for pulmonary edema. Small left pleural effusion suspected. ____________________________________________  FINAL ASSESSMENT AND PLAN  Congestive heart failure, medication noncompliance  Plan: Patient with labs and imaging as dictated above. Patient presented to the ER for shortness of breath that he states was worse last night. Patient states last night he was really scared because he kept waking up gasping for breath. He has agreed to stay in the hospital for diuresis. He was feeling no better after 40 mg of  IV Lasix. He will need to be restarted on all of his medications and will need close cardiology evaluation once discharged.   Emily Filbert, MD   Note: This note was generated in part or whole with voice recognition software. Voice recognition is usually quite accurate but there are transcription errors that can and very often do occur. I apologize for any typographical errors that were not detected and corrected.     Emily Filbert, MD 08/27/16 [phone]

## 2016-08-27 NOTE — ED Notes (Signed)
Patient refusing to leave monitor on at this time. Explained benefits of staying on cardiac monitor

## 2016-08-27 NOTE — H&P (Signed)
[facility] [place] at [facility]   PATIENT NAME: Brian Porter    MR#:  [id number]  DATE OF BIRTH:  03/24/1966  DATE OF ADMISSION:  08/27/2016  PRIMARY CARE PHYSICIAN: No PCP Per Patient   REQUESTING/REFERRING PHYSICIAN: Dr. Daryel November  CHIEF COMPLAINT: Shortness of breath    Chief Complaint  Patient presents with  . Shortness of Breath    HISTORY OF PRESENT ILLNESS:  Brian Porter  is a 51 y.o. male with a known history of Essential hypertension, chronic systolic heart failure with EF 15% noncompliance with medications, patient ran out of medicines since October and he is not taking any medicines for heart failure, comes with shortness of breath, orthopnea, PND. Found to have elevated BNP, CHF on chest x-ray. Patient will be admitted for acute on chronic systolic heart failure. He has no other complaints  PAST MEDICAL HISTORY:   Past Medical History:  Diagnosis Date  . CAD (coronary artery disease)   . GERD (gastroesophageal reflux disease)   . Hypertension   . MI, old   . Sleep apnea     PAST SURGICAL HISTOIRY:   Past Surgical History:  Procedure Laterality Date  . CARDIAC CATHETERIZATION Right 10/16/2015   Procedure: Left Heart Cath and Coronary Angiography;  Surgeon: Laurier Nancy, MD;  Location: [facility] INVASIVE CV LAB;  Service: Cardiovascular;  Laterality: Right;  . CORONARY ANGIOPLASTY WITH STENT PLACEMENT  approx 2 years ago  . PARTIAL NEPHRECTOMY Left   . SPLENECTOMY      SOCIAL HISTORY:   Social History  Substance Use Topics  . Smoking status: Current Every Day Smoker    Packs/day: 1.00    Types: Cigarettes  . Smokeless tobacco: Never Used  . Alcohol use No    FAMILY HISTORY:   Family History  Problem Relation Age of Onset  . Heart failure Mother   . Hypertension Mother   . Diabetes Mellitus II Brother     DRUG ALLERGIES:  No Known Allergies  REVIEW OF SYSTEMS:  CONSTITUTIONAL: No fever, fatigue or weakness.   EYES: No blurred or double vision.  EARS, NOSE, AND THROAT: No tinnitus or ear pain.  RESPIRATORY: cough, shortness of breath. CARDIOVASCULAR: No chest pain, orthopnea, edema.  GASTROINTESTINAL: No nausea, vomiting, diarrhea or abdominal pain.  GENITOURINARY: No dysuria, hematuria.  ENDOCRINE: No polyuria, nocturia,  HEMATOLOGY: No anemia, easy bruising or bleeding SKIN: No rash or lesion. MUSCULOSKELETAL: No joint pain or arthritis.   NEUROLOGIC: No tingling, numbness, weakness.  PSYCHIATRY: No anxiety or depression.   MEDICATIONS AT HOME:   Prior to Admission medications   Medication Sig Start Date End Date Taking? Authorizing Provider  acetaminophen (TYLENOL) 325 MG tablet Take 2 tablets (650 mg total) by mouth every 4 (four) hours as needed for headache or mild pain. 10/16/15  Yes Ramonita Lab, MD  ibuprofen (ADVIL,MOTRIN) 200 MG tablet Take 200 mg by mouth every 6 (six) hours as needed.   Yes Historical Provider, MD  albuterol (PROVENTIL HFA) 108 (90 Base) MCG/ACT inhaler Inhale 2 puffs into the lungs every 4 (four) hours as needed for wheezing or shortness of breath. Patient not taking: Reported on 08/27/2016 09/05/15   Sharman Cheek, MD  aspirin EC 81 MG EC tablet Take 1 tablet (81 mg total) by mouth daily. Patient not taking: Reported on 08/27/2016 10/16/15   Ramonita Lab, MD  carvedilol (COREG) 6.25 MG tablet Take 1 tablet (6.25 mg total) by mouth 2 (two) times daily with a  meal. Patient not taking: Reported on 08/27/2016 10/16/15   Ramonita Lab, MD  cloNIDine (CATAPRES) 0.1 MG tablet Take 1 tablet (0.1 mg total) by mouth every 12 (twelve) hours as needed (withdrawal symptoms). Patient not taking: Reported on 08/27/2016 03/27/16 04/01/16  Minna Antis, MD  cyclobenzaprine (FLEXERIL) 10 MG tablet Take 1 tablet (10 mg total) by mouth every 8 (eight) hours as needed for muscle spasms. Patient not taking: Reported on 08/27/2016 07/31/15   Evangeline Dakin, PA-C  diclofenac (VOLTAREN) 75 MG  EC tablet Take 1 tablet (75 mg total) by mouth 2 (two) times daily. Patient not taking: Reported on 08/27/2016 07/31/15   Charmayne Sheer Beers, PA-C  furosemide (LASIX) 20 MG tablet Take 1 tablet (20 mg total) by mouth daily. Patient not taking: Reported on 08/27/2016 11/28/15 11/27/16  Delma Freeze, FNP  lisinopril (ZESTRIL) 10 MG tablet Take 1 tablet (10 mg total) by mouth daily. Patient not taking: Reported on 08/27/2016 11/28/15   Delma Freeze, FNP  LORazepam (ATIVAN) 1 MG tablet Take 1 tablet (1 mg total) by mouth 2 (two) times daily. Patient not taking: Reported on 08/27/2016 01/13/16 01/12/17  Jeanmarie Plant, MD  meloxicam (MOBIC) 15 MG tablet Take 1 tablet (15 mg total) by mouth daily. Patient not taking: Reported on 08/27/2016 01/12/16   Delorise Royals Cuthriell, PA-C  naproxen (NAPROSYN) 500 MG tablet Take 1 tablet (500 mg total) by mouth 2 (two) times daily with a meal. Patient not taking: Reported on 08/27/2016 09/05/15   Sharman Cheek, MD  ondansetron (ZOFRAN) 4 MG tablet Take 1 tablet (4 mg total) by mouth every 8 (eight) hours as needed for nausea or vomiting. Patient not taking: Reported on 08/27/2016 01/13/16   Jeanmarie Plant, MD  predniSONE (DELTASONE) 20 MG tablet Take 2 tablets (40 mg total) by mouth daily. Patient not taking: Reported on 08/27/2016 09/05/15   Sharman Cheek, MD  promethazine (PHENERGAN) 25 MG tablet Take 1 tablet (25 mg total) by mouth every 6 (six) hours as needed for nausea or vomiting. Patient not taking: Reported on 08/27/2016 03/27/16   Minna Antis, MD  ranitidine (ZANTAC) 150 MG tablet Take 150 mg by mouth 2 (two) times daily as needed for heartburn.    Historical Provider, MD  spironolactone (ALDACTONE) 25 MG tablet Take 1 tablet (25 mg total) by mouth daily. Patient not taking: Reported on 08/27/2016 11/28/15   Delma Freeze, FNP  traMADol (ULTRAM) 50 MG tablet Take 1 tablet (50 mg total) by mouth every 6 (six) hours as needed. Patient not taking: Reported on  08/27/2016 08/06/15   Jennye Moccasin, MD      VITAL SIGNS:  Blood pressure (!) 116/92, pulse 98, temperature 97.9 F (36.6 C), temperature source Oral, resp. rate (!) 25, height 5\' 9"  (1.753 m), weight 90.7 kg (200 lb), SpO2 94 %.  PHYSICAL EXAMINATION:  GENERAL:  51 y.o.-year-old patient lying in the bed with no acute distress.  EYES: Pupils equal, round, reactive to light and accommodation. No scleral icterus. Extraocular muscles intact.  HEENT: Head atraumatic, normocephalic. Oropharynx and nasopharynx clear.  NECK:  Supple, no jugular venous distention. No thyroid enlargement, no tenderness.  LUNGS: diffuse bilateral crepitations present CARDIOVASCULAR: S1, S2 normal. No murmurs, rubs, or gallops.  ABDOMEN: Soft, nontender, nondistended. Bowel sounds present. No organomegaly or mass.  EXTREMITIES: has pedal edema, cyanosis, or clubbing.  NEUROLOGIC: Cranial nerves II through XII are intact. Muscle strength 5/5 in all extremities. Sensation intact. Gait not checked.  PSYCHIATRIC: The patient is alert and oriented x 3.  SKIN: No obvious rash, lesion, or ulcer.   LABORATORY PANEL:   CBC  Recent Labs Lab 08/27/16 0543  WBC 11.4*  HGB 13.7  HCT 40.6  PLT 350   ------------------------------------------------------------------------------------------------------------------  Chemistries   Recent Labs Lab 08/27/16 0543  NA 140  K 4.6  CL 107  CO2 26  GLUCOSE 107*  BUN 18  CREATININE 0.85  CALCIUM 8.5*   ------------------------------------------------------------------------------------------------------------------  Cardiac Enzymes  Recent Labs Lab 08/27/16 0543  TROPONINI <0.03   ------------------------------------------------------------------------------------------------------------------  RADIOLOGY:  Dg Chest 2 View  Result Date: 08/27/2016 CLINICAL DATA:  Acute onset of shortness of breath and productive cough. Initial encounter. EXAM: CHEST  2 VIEW  COMPARISON:  Chest radiograph performed 10/21/2015 FINDINGS: The lungs are well-aerated. Vascular congestion is again noted. Bilateral central and bibasilar airspace opacities raise concern for pulmonary edema. A small left pleural effusion is suspected. No pneumothorax is seen. The heart is mildly enlarged. No acute osseous abnormalities are seen. IMPRESSION: Vascular congestion and mild cardiomegaly. Bilateral central and bibasilar airspace opacities raise concern for pulmonary edema. Small left pleural effusion suspected. Electronically Signed   By: Roanna RaiderJeffery  Chang M.D.   On: 08/27/2016 06:00    EKG:   Orders placed or performed during the hospital encounter of 08/27/16  . ED EKG  . ED EKG   EKG shows sinus tachycardia 10 3 bpm. No ST T changes. IMPRESSION AND PLAN:   #1 acute on chronic systolic heart failure: Noncompliance with medications. Not taking medicines since October. And not following the CHF clinic. Patient will be admitted to telemetry, start IV Lasix 40 mg every 12, continue Coreg, Aldactone, lisinopril, repeat echocardiogram, check daily weights, continue low-sodium diet, encouraged and the explain to him that he needs to continue medicines for heart failure and follow-up with CHF clinic. Consult cardiology. He  had echo last year in April ,EF 15%. Has also had a cardiac catheter done.   All the records are reviewed and case discussed with ED provider. Management plans discussed with the patient, family and they are in agreement.  CODE STATUS: full  TOTAL TIME TAKING CARE OF THIS PATIENT: 55minutes.    Katha HammingKONIDENA,Justus Duerr M.D on 08/27/2016 at 12:17 PM  Between 7am to 6pm - Pager - [phone]  After 6pm go to [email or web address] - password [email or web address] [facility]  [facility]  Office  [phone]  CC: Primary care physician; No PCP Per Patient  Note: This dictation was prepared with Dragon dictation along with smaller phrase technology. Any transcriptional errors that  result from this process are unintentional.

## 2016-08-27 NOTE — Progress Notes (Signed)
Dr Luberta MutterKonidena was paged due to pt  Saying that the tylenol order is not controlling his pain, awaiting call back

## 2016-08-28 LAB — CBC
HEMATOCRIT: 39.2 % — AB (ref 40.0–52.0)
Hemoglobin: 13.2 g/dL (ref 13.0–18.0)
MCH: 31.7 pg (ref 26.0–34.0)
MCHC: 33.6 g/dL (ref 32.0–36.0)
MCV: 94.6 fL (ref 80.0–100.0)
PLATELETS: 342 10*3/uL (ref 150–440)
RBC: 4.15 MIL/uL — ABNORMAL LOW (ref 4.40–5.90)
RDW: 13.9 % (ref 11.5–14.5)
WBC: 10.3 10*3/uL (ref 3.8–10.6)

## 2016-08-28 LAB — BASIC METABOLIC PANEL
Anion gap: 6 (ref 5–15)
BUN: 21 mg/dL — AB (ref 6–20)
CO2: 28 mmol/L (ref 22–32)
CREATININE: 0.87 mg/dL (ref 0.61–1.24)
Calcium: 8.2 mg/dL — ABNORMAL LOW (ref 8.9–10.3)
Chloride: 106 mmol/L (ref 101–111)
GFR calc Af Amer: >60 mL/min (ref >60)
Glucose, Bld: 94 mg/dL (ref 65–99)
POTASSIUM: 3.6 mmol/L (ref 3.5–5.1)
SODIUM: 140 mmol/L (ref 135–145)

## 2016-08-28 LAB — GLUCOSE, CAPILLARY: GLUCOSE-CAPILLARY: 102 mg/dL — AB (ref 65–99)

## 2016-08-28 LAB — HIV ANTIBODY (ROUTINE TESTING W REFLEX): HIV SCREEN 4TH GENERATION: NONREACTIVE

## 2016-08-28 MED ORDER — ENSURE ENLIVE PO LIQD
237.0000 mL | Freq: Two times a day (BID) | ORAL | Status: DC
  Administered 2016-08-28 (×2): 237 mL via ORAL

## 2016-08-28 MED ORDER — NICOTINE 21 MG/24HR TD PT24
21.0000 mg | MEDICATED_PATCH | Freq: Every day | TRANSDERMAL | Status: DC
  Administered 2016-08-28 – 2016-08-29 (×2): 21 mg via TRANSDERMAL
  Filled 2016-08-28 (×2): qty 1

## 2016-08-28 MED ORDER — TRAMADOL HCL 50 MG PO TABS
50.0000 mg | ORAL_TABLET | Freq: Four times a day (QID) | ORAL | Status: DC | PRN
  Administered 2016-08-28 – 2016-08-29 (×4): 50 mg via ORAL
  Filled 2016-08-28 (×4): qty 1

## 2016-08-28 NOTE — Progress Notes (Signed)
Pt still complaining of pain in his left groin after tylenol was given. Although Pt has been ambulating around nursing station multiple times with no problems. MD paged, Dr. Anne HahnWillis does not want to given anything else at this time due to his heart failure. Will continue to monitor. Shirley FriarAlexis Miller, RN, BSN

## 2016-08-28 NOTE — Plan of Care (Signed)
Problem: Cardiac: Goal: Ability to achieve and maintain adequate cardiopulmonary perfusion will improve Outcome: Progressing IV lasix

## 2016-08-28 NOTE — Progress Notes (Signed)
Initial Nutrition Assessment  DOCUMENTATION CODES:   Not applicable  INTERVENTION:  1. Ensure Enlive po BID, each supplement provides 350 kcal and 20 grams of protein  NUTRITION DIAGNOSIS:   Inadequate oral intake related to poor appetite, chronic illness as evidenced by per patient/family report.  GOAL:   Patient will meet greater than or equal to 90% of their needs  MONITOR:   PO intake, I & O's, Labs, Weight trends, Supplement acceptance  REASON FOR ASSESSMENT:   Malnutrition Screening Tool    ASSESSMENT:   Brian Porter  is a 51 y.o. male with a known history of Essential hypertension, chronic systolic heart failure with EF 15% noncompliance with medications, patient ran out of medicines since October and he is not taking any medicines for heart failure, comes with shortness of breath, orthopnea, PND.  Spoke with Brian Porter at bedside. He reports poor appetite since his CHF has been out of control. Per report, patient has been without medicines since October. Reports he continues to eat 2 large meals per day. Usual body weight of 200#. Per chart, exhibits a 5# wt loss over 5 months. Ate sausage and eggs this morning - consumed 100% No other complaints currently. Nutrition-Focused physical exam completed. Findings are moderate fat depletion, no muscle depletion, and no edema.   Labs and medications reviewed: CBG 102 Colace  Diet Order:  Diet 2 gram sodium Room service appropriate? Yes; Fluid consistency: Thin  Skin:  Reviewed, no issues  Last BM:  08/26/2016  Height:   Ht Readings from Last 1 Encounters:  08/27/16 5\' 9"  (1.753 m)    Weight:   Wt Readings from Last 1 Encounters:  08/28/16 195 lb 3.2 oz (88.5 kg)    Ideal Body Weight:  72.72 kg  BMI:  Body mass index is 28.83 kg/m.  Estimated Nutritional Needs:   Kcal:  1800-2100 calories  Protein:  88-106 gm  Fluid:  >/= 1.8L  EDUCATION NEEDS:   No education needs identified at this  time  Dionne AnoWilliam M. Dorian Duval, MS, RD LDN Inpatient Clinical Dietitian Pager [phone]

## 2016-08-28 NOTE — Clinical Social Work Note (Signed)
CSW received consult for patient needing assistance with home medications, case manager can assist with this.  CSW updated case manager, CSW to sign off please reconsult if other social work needs arise.  Ervin KnackEric R. Jahmarion Popoff, MSW, Theresia MajorsLCSWA [phone]  08/28/2016 10:56 AM

## 2016-08-28 NOTE — Discharge Instructions (Signed)
Heart Failure Clinic appointment on September 05, 2016 at 11:40am with Brian Kindredina Aracelis Ulrey, FNP. Please call [phone] to reschedule.

## 2016-08-28 NOTE — Progress Notes (Signed)
[facility] [place] [facility]   PATIENT NAME: Brian MeyerDavid Porter    MR#:  [id number]  DATE OF BIRTH:  07/24/1965  SUBJECTIVE:   Patient here due to shortness of breath and worsening lower extremity edema noted to be in congestive heart failure. Responding well to IV diuretics. Shortness of breath improved. Complains of nonspecific myalgias.  REVIEW OF SYSTEMS:    Review of Systems  Constitutional: Negative for chills and fever.  HENT: Negative for congestion and tinnitus.   Eyes: Negative for blurred vision and double vision.  Respiratory: Positive for shortness of breath. Negative for cough and wheezing.   Cardiovascular: Positive for leg swelling. Negative for chest pain, orthopnea and PND.  Gastrointestinal: Negative for abdominal pain, diarrhea, nausea and vomiting.  Genitourinary: Negative for dysuria and hematuria.  Musculoskeletal: Positive for myalgias.  Neurological: Negative for dizziness, sensory change and focal weakness.  All other systems reviewed and are negative.   Nutrition: Heart healthy Tolerating Diet: Yes Tolerating PT: Ambulatory  DRUG ALLERGIES:  No Known Allergies  VITALS:  Blood pressure 102/71, pulse 90, temperature 97.6 F (36.4 C), temperature source Oral, resp. rate 18, height 5\' 9"  (1.753 m), weight 88.5 kg (195 lb 3.2 oz), SpO2 97 %.  PHYSICAL EXAMINATION:   Physical Exam  GENERAL:  51 y.o.-year-old patient lying in the bed in no acute distress.  EYES: Pupils equal, round, reactive to light and accommodation. No scleral icterus. Extraocular muscles intact.  HEENT: Head atraumatic, normocephalic. Oropharynx and nasopharynx clear.  NECK:  Supple, no jugular venous distention. No thyroid enlargement, no tenderness.  LUNGS: good A/E B/l, no wheezing, bibasilar rales, No rhonchi. No use of accessory muscles of respiration.  CARDIOVASCULAR: S1, S2 normal. No murmurs, rubs, or gallops.  ABDOMEN: Soft, nontender, nondistended. Bowel  sounds present. No organomegaly or mass.  EXTREMITIES: No cyanosis, clubbing or edema b/l.    NEUROLOGIC: Cranial nerves II through XII are intact. No focal Motor or sensory deficits b/l.   PSYCHIATRIC: The patient is alert and oriented x 3.  SKIN: No obvious rash, lesion, or ulcer.    LABORATORY PANEL:   CBC  Recent Labs Lab 08/28/16 0358  WBC 10.3  HGB 13.2  HCT 39.2*  PLT 342   ------------------------------------------------------------------------------------------------------------------  Chemistries   Recent Labs Lab 08/28/16 0358  NA 140  K 3.6  CL 106  CO2 28  GLUCOSE 94  BUN 21*  CREATININE 0.87  CALCIUM 8.2*   ------------------------------------------------------------------------------------------------------------------  Cardiac Enzymes  Recent Labs Lab 08/27/16 0543  TROPONINI <0.03   ------------------------------------------------------------------------------------------------------------------  RADIOLOGY:  Dg Chest 2 View  Result Date: 08/27/2016 CLINICAL DATA:  Acute onset of shortness of breath and productive cough. Initial encounter. EXAM: CHEST  2 VIEW COMPARISON:  Chest radiograph performed 10/21/2015 FINDINGS: The lungs are well-aerated. Vascular congestion is again noted. Bilateral central and bibasilar airspace opacities raise concern for pulmonary edema. A small left pleural effusion is suspected. No pneumothorax is seen. The heart is mildly enlarged. No acute osseous abnormalities are seen. IMPRESSION: Vascular congestion and mild cardiomegaly. Bilateral central and bibasilar airspace opacities raise concern for pulmonary edema. Small left pleural effusion suspected. Electronically Signed   By: Roanna RaiderJeffery  Chang M.D.   On: 08/27/2016 06:00     ASSESSMENT AND PLAN:   51 year old male with past medical history of ischemic cardiomyopathy ejection fraction of less than 15%, congestive heart failure, noncompliance, GERD, obstructive sleep  apnea who presents to the hospital due to worsening lower extremity edema and shortness  of breath.  1. CHF-acute on chronic systolic dysfunction. Secondary to medical noncompliance. -Continue diuresis with IV Lasix, follow I's and O's and daily weights. Clinically improved since yesterday.  -Continue carvedilol, lisinopril, Aldactone.  2. Tobacco abuse-continue nicotine patch.  3. Essential hypertension-continue carvedilol, lisinopril  4. Chronic pain/hx of opiate abuse-no evidence of withdrawal. Cont. PRN Tramadol.     All the records are reviewed and case discussed with Care Management/Social Worker. Management plans discussed with the patient, family and they are in agreement.  CODE STATUS: Full code  DVT Prophylaxis: Lovenox  TOTAL TIME TAKING CARE OF THIS PATIENT: 30 minutes.   POSSIBLE D/C IN 1-2 DAYS, DEPENDING ON CLINICAL CONDITION.   Houston Siren M.D on 08/28/2016 at 3:43 PM  Between 7am to 6pm - Pager - [phone]  After 6pm go to [email or web address] - Scientist, research (life sciences) [facility]  Office  [phone]  CC: Primary care physician; No PCP Per Patient

## 2016-08-28 NOTE — Progress Notes (Signed)
Pt requesting something for sleep. Pt states he takes Trazodone at home he first said he took 25mg  then changed his mind and said he took 50mg  he said he got this at [facility]. MD paged, Dr. Anne HahnWillis to put in 25mg  of Trazodone. Will wait for orders & continue to monitor. Shirley FriarAlexis Miller, RN, BSN

## 2016-08-28 NOTE — Care Management (Signed)
Patient is without a payor source.  He has no income.  Lives with his girlfriend who provided financial support.  Spoke with patient his noncompliance and the continued adverse affect on his cardiac status.discussed the potential for sudden cardiac death if he continues with noncompliance.  Discussed proceeding to [place] for medicaid application.  Provided applications for [facility] and review application requirements and advised to start the application and CM would assist.  Discussed with patient he will qualify if he completes the application.  CM will follow closely for discharge meds and attending is aware of need for medication assistance.  Discussed the efficacy of life vest in patient with major compliance issues with attending.   Patient says "I do not want to die.  I will do what the doctor says."  Tina form the [facility] met with patient and he has been made an appointment for next week. 

## 2016-08-28 NOTE — Progress Notes (Signed)
HF Clinic follow-up appointment scheduled for September 05, 2016 at 11:40am. Of note, he did not show for a previously scheduled appointment on 11/29/15. Thank you.

## 2016-08-29 LAB — BASIC METABOLIC PANEL
Anion gap: 7 (ref 5–15)
BUN: 22 mg/dL — AB (ref 6–20)
CHLORIDE: 103 mmol/L (ref 101–111)
CO2: 29 mmol/L (ref 22–32)
CREATININE: 0.9 mg/dL (ref 0.61–1.24)
Calcium: 8.2 mg/dL — ABNORMAL LOW (ref 8.9–10.3)
GFR calc non Af Amer: >60 mL/min (ref >60)
Glucose, Bld: 105 mg/dL — ABNORMAL HIGH (ref 65–99)
POTASSIUM: 3.7 mmol/L (ref 3.5–5.1)
SODIUM: 139 mmol/L (ref 135–145)

## 2016-08-29 LAB — GLUCOSE, CAPILLARY: GLUCOSE-CAPILLARY: 122 mg/dL — AB (ref 65–99)

## 2016-08-29 MED ORDER — LISINOPRIL 10 MG PO TABS: 10.0000 mg | ORAL_TABLET | Freq: Every day | ORAL | 1 refills | Status: DC

## 2016-08-29 MED ORDER — POTASSIUM CHLORIDE ER 10 MEQ PO TBCR: 10.0000 meq | EXTENDED_RELEASE_TABLET | Freq: Every day | ORAL | 1 refills | Status: DC

## 2016-08-29 MED ORDER — FUROSEMIDE 20 MG PO TABS: 40.0000 mg | ORAL_TABLET | Freq: Every day | ORAL | 3 refills | Status: DC

## 2016-08-29 MED ORDER — ASPIRIN 81 MG PO TBEC: 81.0000 mg | DELAYED_RELEASE_TABLET | Freq: Every day | ORAL | 2 refills | Status: DC

## 2016-08-29 MED ORDER — SPIRONOLACTONE 25 MG PO TABS: 25.0000 mg | ORAL_TABLET | Freq: Every day | ORAL | 2 refills | Status: DC

## 2016-08-29 MED ORDER — CARVEDILOL 6.25 MG PO TABS: 6.2500 mg | ORAL_TABLET | Freq: Two times a day (BID) | ORAL | 0 refills | Status: DC

## 2016-08-29 NOTE — Progress Notes (Signed)
Pt discharged to home via wc.  Instructions and rx given to pt.  Questions answered.  No distress.  

## 2016-08-29 NOTE — Progress Notes (Signed)
Dr. Cherlynn KaiserSainani returned page verbal order to hold am Coreg this morning

## 2016-08-29 NOTE — Progress Notes (Signed)
Page out to Dr. Cherlynn KaiserSainani regarding BP [phone]

## 2016-08-29 NOTE — Plan of Care (Signed)
Problem: Activity: Goal: Capacity to carry out activities will improve Outcome: Progressing Patient walks around the unit frequently. PRN medication for sleep provided upon patient request.

## 2016-08-29 NOTE — Discharge Summary (Signed)
[facility] - [place] [facility]   PATIENT NAME: Brian MeyerDavid Porter    MR#:  [id number]  DATE OF BIRTH:  12/28/1965  DATE OF ADMISSION:  08/27/2016 ADMITTING PHYSICIAN: Katha HammingSnehalatha Konidena, MD  DATE OF DISCHARGE: 08/29/2016 11:30 AM  PRIMARY CARE PHYSICIAN: No PCP Per Patient    ADMISSION DIAGNOSIS:  Acute on chronic systolic congestive heart failure (HCC) [I50.23]  DISCHARGE DIAGNOSIS:  Active Problems:   Acute on chronic systolic heart failure (HCC)   SECONDARY DIAGNOSIS:   Past Medical History:  Diagnosis Date  . CAD (coronary artery disease)   . GERD (gastroesophageal reflux disease)   . Hypertension   . MI, old   . Sleep apnea     HOSPITAL COURSE:   51 year old male with past medical history of ischemic cardiomyopathy ejection fraction of less than 15%, congestive heart failure, noncompliance, GERD, obstructive sleep apnea who presents to the hospital due to worsening lower extremity edema and shortness of breath.  1. CHF-acute on chronic systolic dysfunction. Secondary to medical noncompliance. - pt. Was aggressively diuresed with IV lasix and has significantly improved and now being discharged on Oral Lasix, Aldactone, Coreg, Lisinopril.  He will follow up at the CHF clinic.   2. Tobacco abuse- he was maintained on nicotine patch while in the hospital and was told to abstain from smoking.   3. Essential hypertension- he will continue carvedilol, lisinopril  4. GERD - cont. Zantac.    DISCHARGE CONDITIONS:   Stable.   CONSULTS OBTAINED:    DRUG ALLERGIES:  No Known Allergies  DISCHARGE MEDICATIONS:   Allergies as of 08/29/2016   No Known Allergies     Medication List    STOP taking these medications   albuterol 108 (90 Base) MCG/ACT inhaler Commonly known as:  PROVENTIL HFA   cloNIDine 0.1 MG tablet Commonly known as:  CATAPRES   cyclobenzaprine 10 MG tablet Commonly known as:  FLEXERIL   diclofenac 75 MG EC tablet Commonly  known as:  VOLTAREN   ibuprofen 200 MG tablet Commonly known as:  ADVIL,MOTRIN   LORazepam 1 MG tablet Commonly known as:  ATIVAN   meloxicam 15 MG tablet Commonly known as:  MOBIC   naproxen 500 MG tablet Commonly known as:  NAPROSYN   ondansetron 4 MG tablet Commonly known as:  ZOFRAN   predniSONE 20 MG tablet Commonly known as:  DELTASONE   promethazine 25 MG tablet Commonly known as:  PHENERGAN   traMADol 50 MG tablet Commonly known as:  ULTRAM     TAKE these medications   acetaminophen 325 MG tablet Commonly known as:  TYLENOL Take 2 tablets (650 mg total) by mouth every 4 (four) hours as needed for headache or mild pain.   aspirin 81 MG EC tablet Take 1 tablet (81 mg total) by mouth daily.   carvedilol 6.25 MG tablet Commonly known as:  COREG Take 1 tablet (6.25 mg total) by mouth 2 (two) times daily with a meal.   furosemide 20 MG tablet Commonly known as:  LASIX Take 2 tablets (40 mg total) by mouth daily. What changed:  how much to take   lisinopril 10 MG tablet Commonly known as:  ZESTRIL Take 1 tablet (10 mg total) by mouth daily.   potassium chloride 10 MEQ tablet Commonly known as:  K-DUR Take 1 tablet (10 mEq total) by mouth daily.   ranitidine 150 MG tablet Commonly known as:  ZANTAC Take 150 mg by mouth 2 (two) times daily as  needed for heartburn.   spironolactone 25 MG tablet Commonly known as:  ALDACTONE Take 1 tablet (25 mg total) by mouth daily.         DISCHARGE INSTRUCTIONS:   DIET:  Cardiac diet  DISCHARGE CONDITION:  Stable  ACTIVITY:  Activity as tolerated  OXYGEN:  Home Oxygen: No.   Oxygen Delivery: room air  DISCHARGE LOCATION:  home   If you experience worsening of your admission symptoms, develop shortness of breath, life threatening emergency, suicidal or homicidal thoughts you must seek medical attention immediately by calling 911 or calling your MD immediately  if symptoms less severe.  You Must  read complete instructions/literature along with all the possible adverse reactions/side effects for all the Medicines you take and that have been prescribed to you. Take any new Medicines after you have completely understood and accpet all the possible adverse reactions/side effects.   Please note  You were cared for by a hospitalist during your hospital stay. If you have any questions about your discharge medications or the care you received while you were in the hospital after you are discharged, you can call the unit and asked to speak with the hospitalist on call if the hospitalist that took care of you is not available. Once you are discharged, your primary care physician will handle any further medical issues. Please note that NO REFILLS for any discharge medications will be authorized once you are discharged, as it is imperative that you return to your primary care physician (or establish a relationship with a primary care physician if you do not have one) for your aftercare needs so that they can reassess your need for medications and monitor your lab values.     Today   Shortness of breath much improved.  NO acute complaints overnight.  Will d/c home today.   VITAL SIGNS:  Blood pressure 94/63, pulse 80, temperature 97.9 F (36.6 C), temperature source Oral, resp. rate 17, height 5\' 9"  (1.753 m), weight 87.3 kg (192 lb 6.4 oz), SpO2 95 %.  I/O:   Intake/Output Summary (Last 24 hours) at 08/29/16 1431 Last data filed at 08/29/16 1015  Gross per 24 hour  Intake              240 ml  Output             2200 ml  Net            -1960 ml    PHYSICAL EXAMINATION:  GENERAL:  51 y.o.-year-old patient lying in the bed with no acute distress.  EYES: Pupils equal, round, reactive to light and accommodation. No scleral icterus. Extraocular muscles intact.  HEENT: Head atraumatic, normocephalic. Oropharynx and nasopharynx clear.  NECK:  Supple, no jugular venous distention. No thyroid  enlargement, no tenderness.  LUNGS: Normal breath sounds bilaterally, no wheezing, rales,rhonchi. No use of accessory muscles of respiration.  CARDIOVASCULAR: S1, S2 normal. No murmurs, rubs, or gallops.  ABDOMEN: Soft, non-tender, non-distended. Bowel sounds present. No organomegaly or mass.  EXTREMITIES: No pedal edema, cyanosis, or clubbing.  NEUROLOGIC: Cranial nerves II through XII are intact. No focal motor or sensory defecits b/l.  PSYCHIATRIC: The patient is alert and oriented x 3.   SKIN: No obvious rash, lesion, or ulcer.   DATA REVIEW:   CBC  Recent Labs Lab 08/28/16 0358  WBC 10.3  HGB 13.2  HCT 39.2*  PLT 342    Chemistries   Recent Labs Lab 08/29/16 0553  NA 139  K 3.7  CL 103  CO2 29  GLUCOSE 105*  BUN 22*  CREATININE 0.90  CALCIUM 8.2*    Cardiac Enzymes  Recent Labs Lab 08/27/16 0543  TROPONINI <0.03    Microbiology Results  No results found for this or any previous visit.  RADIOLOGY:  No results found.    Management plans discussed with the patient, family and they are in agreement.  CODE STATUS:     Code Status Orders        Start     Ordered   08/27/16 1050  Full code  Continuous     08/27/16 1051    Code Status History    Date Active Date Inactive Code Status Order ID Comments User Context   10/15/2015  7:50 AM 10/16/2015  5:51 PM Full Code [id number]  Ihor Austin, MD Inpatient      TOTAL TIME TAKING CARE OF THIS PATIENT: 40 minutes.    Houston Siren M.D on 08/29/2016 at 2:31 PM  Between 7am to 6pm - Pager - [phone]  After 6pm go to [email or web address] - Scientist, research (life sciences)  [facility]  Office  [phone]  CC: Primary care physician; No PCP Per Patient

## 2016-09-05 ENCOUNTER — Encounter: Payer: Self-pay | Admitting: Family

## 2016-09-05 ENCOUNTER — Ambulatory Visit: Payer: Self-pay | Attending: Family | Admitting: Family

## 2016-09-05 VITALS — BP 99/67 | HR 89 | Resp 18 | Ht 70.0 in | Wt 188.4 lb

## 2016-09-05 DIAGNOSIS — I5022 Chronic systolic (congestive) heart failure: Secondary | ICD-10-CM | POA: Insufficient documentation

## 2016-09-05 DIAGNOSIS — K219 Gastro-esophageal reflux disease without esophagitis: Secondary | ICD-10-CM | POA: Insufficient documentation

## 2016-09-05 DIAGNOSIS — I252 Old myocardial infarction: Secondary | ICD-10-CM | POA: Insufficient documentation

## 2016-09-05 DIAGNOSIS — Z720 Tobacco use: Secondary | ICD-10-CM

## 2016-09-05 DIAGNOSIS — I10 Essential (primary) hypertension: Secondary | ICD-10-CM

## 2016-09-05 DIAGNOSIS — R42 Dizziness and giddiness: Secondary | ICD-10-CM | POA: Insufficient documentation

## 2016-09-05 DIAGNOSIS — I110 Hypertensive heart disease with heart failure: Secondary | ICD-10-CM | POA: Insufficient documentation

## 2016-09-05 DIAGNOSIS — F17210 Nicotine dependence, cigarettes, uncomplicated: Secondary | ICD-10-CM | POA: Insufficient documentation

## 2016-09-05 DIAGNOSIS — Z7982 Long term (current) use of aspirin: Secondary | ICD-10-CM | POA: Insufficient documentation

## 2016-09-05 DIAGNOSIS — I2510 Atherosclerotic heart disease of native coronary artery without angina pectoris: Secondary | ICD-10-CM | POA: Insufficient documentation

## 2016-09-05 DIAGNOSIS — G4733 Obstructive sleep apnea (adult) (pediatric): Secondary | ICD-10-CM | POA: Insufficient documentation

## 2016-09-05 NOTE — Progress Notes (Signed)
Subjective:    Patient ID: Brian Porter, male    DOB: 1965-07-03, 51 y.o.   MRN: [id number]  HPI  Brian Porter is a 51 y/o male with a history of obstructive sleep apnea (without CPAP), CAD, HTN, GERD, MI, current tobacco use and chronic heart failure.   Last echo was done 10/15/15 and showed an EF of 35% along with moderate Brian/TR and an elevated PA pressure of 48 mm Hg. Had a cardiac catheterization done 10/16/15 and showed an EF of 15% along with multi-vessel disease. Recommended treatment for cardiomyopathy.   Admitted 08/27/16 with HF exacerbation. Initially treated with IV diuretics. Other medications were adjusted and he was discharged home after 2 days. Was in the ED on 03/30/16 for opiate withdrawal. Was in the ED on 03/27/16 for opiate detox.  He presents today for a follow-up visit although he hasn't been seen here since May 2017. Does endorse some fatigue with moderate exertion but denies any shortness of breath, edema or chest pain. Weighing daily and says that his weight has been stable since discharge. Hasn't been taking his lisinopril as it makes him very tired and drops his blood pressure when he takes it.   Past Medical History:  Diagnosis Date  . CAD (coronary artery disease)   . GERD (gastroesophageal reflux disease)   . Hypertension   . MI, old   . Sleep apnea    Past Surgical History:  Procedure Laterality Date  . CARDIAC CATHETERIZATION Right 10/16/2015   Procedure: Left Heart Cath and Coronary Angiography;  Surgeon: Laurier Nancy, MD;  Location: [facility] INVASIVE CV LAB;  Service: Cardiovascular;  Laterality: Right;  . CORONARY ANGIOPLASTY WITH STENT PLACEMENT  approx 2 years ago  . PARTIAL NEPHRECTOMY Left   . SPLENECTOMY     Family History  Problem Relation Age of Onset  . Heart failure Mother   . Hypertension Mother   . Diabetes Mellitus II Brother    Social History  Substance Use Topics  . Smoking status: Current Every Day Smoker    Packs/day: 1.00    Types:  Cigarettes  . Smokeless tobacco: Never Used  . Alcohol use No   No Known Allergies  Prior to Admission medications   Medication Sig Start Date End Date Taking? Authorizing Provider  aspirin 81 MG EC tablet Take 1 tablet (81 mg total) by mouth daily. 08/29/16  Yes Houston Siren, MD  carvedilol (COREG) 6.25 MG tablet Take 1 tablet (6.25 mg total) by mouth 2 (two) times daily with a meal. 08/29/16  Yes Houston Siren, MD  furosemide (LASIX) 20 MG tablet Take 2 tablets (40 mg total) by mouth daily. 08/29/16 08/29/17 Yes Houston Siren, MD  potassium chloride (K-DUR) 10 MEQ tablet Take 1 tablet (10 mEq total) by mouth daily. 08/29/16  Yes Houston Siren, MD  ranitidine (ZANTAC) 150 MG tablet Take 150 mg by mouth 2 (two) times daily as needed for heartburn.   Yes Historical Provider, MD  spironolactone (ALDACTONE) 25 MG tablet Take 1 tablet (25 mg total) by mouth daily. 08/29/16  Yes Houston Siren, MD  lisinopril (ZESTRIL) 10 MG tablet Take 1 tablet (10 mg total) by mouth daily. Patient not taking: Reported on 09/05/2016 08/29/16   Houston Siren, MD    Review of Systems  Constitutional: Positive for fatigue. Negative for appetite change.  HENT: Positive for postnasal drip. Negative for congestion and sore throat.   Eyes: Negative.   Respiratory: Positive for  cough. Negative for chest tightness and shortness of breath.   Cardiovascular: Negative for chest pain, palpitations and leg swelling.  Gastrointestinal: Negative for abdominal distention and abdominal pain.  Endocrine: Negative.   Genitourinary: Negative.   Musculoskeletal: Negative for back pain and neck pain.  Skin: Negative.   Allergic/Immunologic: Negative.   Neurological: Positive for light-headedness. Negative for dizziness.  Hematological: Negative for adenopathy. Does not bruise/bleed easily.  Psychiatric/Behavioral: Negative for dysphoric mood, sleep disturbance (sleeping on 2 pillows) and suicidal ideas. The patient is not  nervous/anxious.    Vitals:   09/05/16 1125  BP: 99/67  Pulse: 89  Resp: 18  SpO2: 98%  Weight: 188 lb 6 oz (85.4 kg)  Height: 5\' 10"  (1.778 m)   Wt Readings from Last 3 Encounters:  09/05/16 188 lb 6 oz (85.4 kg)  08/29/16 192 lb 6.4 oz (87.3 kg)  03/30/16 195 lb (88.5 kg)   Lab Results  Component Value Date   CREATININE 0.90 08/29/2016   CREATININE 0.87 08/28/2016   CREATININE 0.85 08/27/2016      Objective:   Physical Exam  Constitutional: He is oriented to person, place, and time. He appears well-developed and well-nourished.  HENT:  Head: Normocephalic and atraumatic.  Eyes: Conjunctivae are normal. Pupils are equal, round, and reactive to light.  Neck: Normal range of motion. Neck supple. No JVD present.  Cardiovascular: Normal rate and regular rhythm.   Pulmonary/Chest: Effort normal. He has no wheezes. He has no rales.  Abdominal: Soft. He exhibits no distension. There is no tenderness.  Musculoskeletal: He exhibits no edema or tenderness.  Neurological: He is alert and oriented to person, place, and time.  Skin: Skin is warm and dry.  Psychiatric: He has a normal mood and affect. His behavior is normal. Thought content normal.  Nursing note and vitals reviewed.     Assessment & Plan:   1: Chronic heart failure with reduced ejection fraction- - NYHA class II - euvolemic - already weighing daily. Reminded to call for an overnight weight gain of >2 pounds or a weekly weight gain of >5 pounds. Weight down 4 pounds from hospital discharge - hasn't been taking his lisinopril as it drops his blood pressure and makes him feel very tired. Discussed adding entresto with probably decreasing his furosemide at that time to help maintain his blood pressure - not adding salt to his food and tries to follow a 2000mg  sodium diet - [facility]  2: HTN- - BP on the low side today - some lightheadedness with position changes.  Encouraged to change position slowly  3: Tobacco use- - smokes 1 ppd of cigarettes - complete cessation discussed for 3 minutes with him  Medication bottles were reviewed.  Return here in 1 month or sooner for any questions/problems before then.  - needs to get established with [facility]

## 2016-09-05 NOTE — Patient Instructions (Signed)
Continue weighing daily and call for an overnight weight gain of > 2 pounds or a weekly weight gain of >5 pounds. 

## 2016-09-12 ENCOUNTER — Telehealth: Payer: Self-pay

## 2016-09-12 ENCOUNTER — Telehealth: Payer: Self-pay | Admitting: Family

## 2016-09-12 NOTE — Telephone Encounter (Signed)
LM on voicemail asking patient to call us back to update us on what his weight has been over the last week, if he has any edema or difficulty breathing.

## 2016-09-12 NOTE — Telephone Encounter (Signed)
Mr. Brian Porter was returning Tina's call. He reports he has been doing well. He has not been experiencing any shortness of breath or edema. No report of weight gain. We will follow up with him at his next visit.

## 2016-10-03 ENCOUNTER — Ambulatory Visit: Payer: Self-pay | Admitting: Family

## 2016-10-07 ENCOUNTER — Ambulatory Visit: Payer: Self-pay | Admitting: Pharmacy Technician

## 2016-10-07 ENCOUNTER — Ambulatory Visit: Payer: Self-pay | Attending: Family | Admitting: Family

## 2016-10-07 ENCOUNTER — Encounter: Payer: Self-pay | Admitting: Family

## 2016-10-07 VITALS — BP 112/77 | HR 90 | Resp 18 | Ht 70.0 in | Wt 192.2 lb

## 2016-10-07 DIAGNOSIS — I110 Hypertensive heart disease with heart failure: Secondary | ICD-10-CM | POA: Insufficient documentation

## 2016-10-07 DIAGNOSIS — I10 Essential (primary) hypertension: Secondary | ICD-10-CM

## 2016-10-07 DIAGNOSIS — I5022 Chronic systolic (congestive) heart failure: Secondary | ICD-10-CM | POA: Insufficient documentation

## 2016-10-07 DIAGNOSIS — Z720 Tobacco use: Secondary | ICD-10-CM

## 2016-10-07 DIAGNOSIS — F17210 Nicotine dependence, cigarettes, uncomplicated: Secondary | ICD-10-CM | POA: Insufficient documentation

## 2016-10-07 DIAGNOSIS — I2510 Atherosclerotic heart disease of native coronary artery without angina pectoris: Secondary | ICD-10-CM | POA: Insufficient documentation

## 2016-10-07 DIAGNOSIS — Z7982 Long term (current) use of aspirin: Secondary | ICD-10-CM | POA: Insufficient documentation

## 2016-10-07 DIAGNOSIS — K219 Gastro-esophageal reflux disease without esophagitis: Secondary | ICD-10-CM | POA: Insufficient documentation

## 2016-10-07 DIAGNOSIS — I252 Old myocardial infarction: Secondary | ICD-10-CM | POA: Insufficient documentation

## 2016-10-07 DIAGNOSIS — G4733 Obstructive sleep apnea (adult) (pediatric): Secondary | ICD-10-CM | POA: Insufficient documentation

## 2016-10-07 MED ORDER — CARVEDILOL 6.25 MG PO TABS: 6.2500 mg | ORAL_TABLET | Freq: Two times a day (BID) | ORAL | 5 refills | Status: DC

## 2016-10-07 NOTE — Progress Notes (Signed)
Patient ID: Brian Porter, male    DOB: [birth year], 51 y.o.   MRN: [id number]  HPI  Brian Porter is a 51 y/o male with a history of obstructive sleep apnea (without CPAP), CAD, HTN, GERD, MI, current tobacco use and chronic heart failure.   Last echo was done 10/15/15 and showed an EF of 35% along with moderate Brian/TR and an elevated PA pressure of 48 mm Hg. Had a cardiac catheterization done 10/16/15 and showed an EF of 15% along with multi-vessel disease. Recommended treatment for cardiomyopathy.   Admitted 08/27/16 with HF exacerbation. Initially treated with IV diuretics. Other medications were adjusted and he was discharged home after 2 days. Was in the ED on 03/30/16 for opiate withdrawal. Was in the ED on 03/27/16 for opiate detox.  He presents today for a follow-up visit with minimal fatigue. Denies any shortness of breath or swelling in his legs/abdomen. Has noticed a gradual weight gain as his appetite has improved. Continues off his lisinopril as he felt very tired when taking it and he had a low blood pressure with it. Feels much better since not taking it anymore.  Past Medical History:  Diagnosis Date  . CAD (coronary artery disease)   . GERD (gastroesophageal reflux disease)   . Hypertension   . MI, old   . Sleep apnea    Past Surgical History:  Procedure Laterality Date  . CARDIAC CATHETERIZATION Right 10/16/2015   Procedure: Left Heart Cath and Coronary Angiography;  Surgeon: Laurier Nancy, MD;  Location: [facility] INVASIVE CV LAB;  Service: Cardiovascular;  Laterality: Right;  . CORONARY ANGIOPLASTY WITH STENT PLACEMENT  approx 2 years ago  . PARTIAL NEPHRECTOMY Left   . SPLENECTOMY     Family History  Problem Relation Age of Onset  . Heart failure Mother   . Hypertension Mother   . Diabetes Mellitus II Brother    Social History  Substance Use Topics  . Smoking status: Current Every Day Smoker    Packs/day: 1.00    Types: Cigarettes  . Smokeless tobacco: Never Used  .  Alcohol use No   No Known Allergies Prior to Admission medications   Medication Sig Start Date End Date Taking? Authorizing Provider  aspirin 81 MG EC tablet Take 1 tablet (81 mg total) by mouth daily. 08/29/16  Yes Houston Siren, MD  carvedilol (COREG) 6.25 MG tablet Take 1 tablet (6.25 mg total) by mouth 2 (two) times daily with a meal. 10/07/16  Yes Delma Freeze, FNP  furosemide (LASIX) 20 MG tablet Take 2 tablets (40 mg total) by mouth daily. 08/29/16 08/29/17 Yes Houston Siren, MD  potassium chloride (K-DUR) 10 MEQ tablet Take 1 tablet (10 mEq total) by mouth daily. 08/29/16  Yes Houston Siren, MD  spironolactone (ALDACTONE) 25 MG tablet Take 1 tablet (25 mg total) by mouth daily. 08/29/16  Yes Houston Siren, MD  lisinopril (ZESTRIL) 10 MG tablet Take 1 tablet (10 mg total) by mouth daily. Patient not taking: Reported on 09/05/2016 08/29/16   Houston Siren, MD  ranitidine (ZANTAC) 150 MG tablet Take 150 mg by mouth 2 (two) times daily as needed for heartburn.    Historical Provider, MD     Review of Systems  Constitutional: Positive for fatigue (minimal). Negative for appetite change.  HENT: Negative for congestion, postnasal drip and sore throat.   Eyes: Negative.   Respiratory: Negative for cough, chest tightness and shortness of breath.   Cardiovascular: Negative  for chest pain, palpitations and leg swelling.  Gastrointestinal: Negative for abdominal distention and abdominal pain.  Endocrine: Negative.   Genitourinary: Negative.   Musculoskeletal: Negative for back pain and neck pain.  Skin: Negative.   Allergic/Immunologic: Negative.   Neurological: Negative for dizziness and light-headedness.  Hematological: Negative for adenopathy. Does not bruise/bleed easily.  Psychiatric/Behavioral: Negative for dysphoric mood, sleep disturbance (sleeping on 2 pillows) and suicidal ideas. The patient is not nervous/anxious.    Vitals:   10/07/16 1246  BP: 112/77  Pulse: 90  Resp: 18   SpO2: 100%  Weight: 192 lb 4 oz (87.2 kg)  Height:  (1.778 m)   Wt Readings from Last 3 Encounters:  10/07/16 192 lb 4 oz (87.2 kg)  09/05/16 188 lb 6 oz (85.4 kg)  08/29/16 192 lb 6.4 oz (87.3 kg)   Lab Results  Component Value Date   CREATININE 0.90 08/29/2016   CREATININE 0.87 08/28/2016   CREATININE 0.85 08/27/2016    Physical Exam  Constitutional: He is oriented to person, place, and time. He appears well-developed and well-nourished.  HENT:  Head: Normocephalic and atraumatic.  Eyes: Conjunctivae are normal. Pupils are equal, round, and reactive to light.  Neck: Normal range of motion. Neck supple. No JVD present.  Cardiovascular: Normal rate and regular rhythm.   Pulmonary/Chest: Effort normal. He has no wheezes. He has no rales.  Abdominal: Soft. He exhibits no distension. There is no tenderness.  Musculoskeletal: He exhibits no edema or tenderness.  Neurological: He is alert and oriented to person, place, and time.  Skin: Skin is warm and dry.  Psychiatric: He has a normal mood and affect. His behavior is normal. Thought content normal.  Nursing note and vitals reviewed.     Assessment & Plan:   1: Chronic heart failure with reduced ejection fraction- - NYHA class II - euvolemic - already weighing daily. Reminded to call for an overnight weight gain of >2 pounds or a weekly weight gain of >5 pounds. Weight up 4 pounds from last visit - hasn't been taking his lisinopril as it drops his blood pressure and makes him feel very tired.  - decrease his furosemide to  daily with the hope that his blood pressure will rise so entresto could be added. Instructed that should his weight increase by above parameters, develop shortness of breath or swelling that he may need to increase it back to  daily.  - not adding salt to his food and tries to follow a  sodium diet  2: HTN- - BP on the low side  - decreasing furosemide per above - denies any  dizziness  3: Tobacco use- - smokes 1 ppd of cigarettes - complete cessation discussed for 3 minutes with him  Medication bottles were reviewed.  Return in 3 months or sooner for any questions/problems before then.

## 2016-10-07 NOTE — Progress Notes (Signed)
Patient scheduled for eligibility appointment at [facility].  Patient did not show for the appointment on 10/07/2016 at 2:00p.m.  Patient did not reschedule eligibility appointment.  [facility] will be unable to provide additional medication assistance until eligibility is determined.  Sherilyn Dacosta Care Manager [facility]

## 2016-10-07 NOTE — Patient Instructions (Signed)
Continue weighing daily and call for an overnight weight gain of > 2 pounds or a weekly weight gain of >5 pounds. 

## 2016-11-23 ENCOUNTER — Emergency Department: Payer: Self-pay

## 2016-11-23 ENCOUNTER — Encounter: Payer: Self-pay | Admitting: Emergency Medicine

## 2016-11-23 ENCOUNTER — Emergency Department
Admission: EM | Admit: 2016-11-23 | Discharge: 2016-11-23 | Disposition: A | Discharge status: Home / Self Care | Payer: Self-pay | Attending: Emergency Medicine | Admitting: Emergency Medicine

## 2016-11-23 DIAGNOSIS — I110 Hypertensive heart disease with heart failure: Secondary | ICD-10-CM | POA: Insufficient documentation

## 2016-11-23 DIAGNOSIS — I509 Heart failure, unspecified: Secondary | ICD-10-CM | POA: Insufficient documentation

## 2016-11-23 DIAGNOSIS — Z9081 Acquired absence of spleen: Secondary | ICD-10-CM | POA: Insufficient documentation

## 2016-11-23 DIAGNOSIS — K4021 Bilateral inguinal hernia, without obstruction or gangrene, recurrent: Secondary | ICD-10-CM

## 2016-11-23 DIAGNOSIS — Z7982 Long term (current) use of aspirin: Secondary | ICD-10-CM | POA: Insufficient documentation

## 2016-11-23 DIAGNOSIS — I252 Old myocardial infarction: Secondary | ICD-10-CM | POA: Insufficient documentation

## 2016-11-23 DIAGNOSIS — F17210 Nicotine dependence, cigarettes, uncomplicated: Secondary | ICD-10-CM | POA: Insufficient documentation

## 2016-11-23 DIAGNOSIS — I2510 Atherosclerotic heart disease of native coronary artery without angina pectoris: Secondary | ICD-10-CM | POA: Insufficient documentation

## 2016-11-23 DIAGNOSIS — Z955 Presence of coronary angioplasty implant and graft: Secondary | ICD-10-CM | POA: Insufficient documentation

## 2016-11-23 DIAGNOSIS — Z79899 Other long term (current) drug therapy: Secondary | ICD-10-CM | POA: Insufficient documentation

## 2016-11-23 DIAGNOSIS — K4090 Unilateral inguinal hernia, without obstruction or gangrene, not specified as recurrent: Secondary | ICD-10-CM | POA: Insufficient documentation

## 2016-11-23 LAB — CBC
HCT: 47 % (ref 40.0–52.0)
Hemoglobin: 16.2 g/dL (ref 13.0–18.0)
MCH: 31.2 pg (ref 26.0–34.0)
MCHC: 34.5 g/dL (ref 32.0–36.0)
MCV: 90.5 fL (ref 80.0–100.0)
Platelets: 287 10*3/uL (ref 150–440)
RBC: 5.19 MIL/uL (ref 4.40–5.90)
RDW: 15 % — AB (ref 11.5–14.5)
WBC: 11.2 10*3/uL — ABNORMAL HIGH (ref 3.8–10.6)

## 2016-11-23 LAB — COMPREHENSIVE METABOLIC PANEL
ALT: 11 U/L — AB (ref 17–63)
ANION GAP: 5 (ref 5–15)
AST: 14 U/L — ABNORMAL LOW (ref 15–41)
Albumin: 3.8 g/dL (ref 3.5–5.0)
Alkaline Phosphatase: 51 U/L (ref 38–126)
BUN: 17 mg/dL (ref 6–20)
CO2: 27 mmol/L (ref 22–32)
Calcium: 8.7 mg/dL — ABNORMAL LOW (ref 8.9–10.3)
Chloride: 106 mmol/L (ref 101–111)
Creatinine, Ser: 0.81 mg/dL (ref 0.61–1.24)
GFR calc non Af Amer: >60 mL/min (ref >60)
Glucose, Bld: 100 mg/dL — ABNORMAL HIGH (ref 65–99)
POTASSIUM: 4.4 mmol/L (ref 3.5–5.1)
SODIUM: 138 mmol/L (ref 135–145)
Total Bilirubin: 1.1 mg/dL (ref 0.3–1.2)
Total Protein: 7.3 g/dL (ref 6.5–8.1)

## 2016-11-23 LAB — LIPASE, BLOOD: Lipase: 33 U/L (ref 11–51)

## 2016-11-23 LAB — PROTIME-INR
INR: 1
PROTHROMBIN TIME: 13.2 s (ref 11.4–15.2)

## 2016-11-23 MED ORDER — MORPHINE SULFATE (PF) 4 MG/ML IV SOLN
4.0000 mg | Freq: Once | INTRAVENOUS | Status: AC
  Administered 2016-11-23: 4 mg via INTRAVENOUS
  Filled 2016-11-23: qty 1

## 2016-11-23 MED ORDER — ONDANSETRON HCL 4 MG/2ML IJ SOLN
4.0000 mg | Freq: Once | INTRAMUSCULAR | Status: AC
  Administered 2016-11-23: 4 mg via INTRAVENOUS
  Filled 2016-11-23: qty 2

## 2016-11-23 NOTE — Consult Note (Addendum)
Patient ID: Brian Porter, male   DOB: 02/28/1966, 51 y.o.   MRN: [id number]  CC: Groin pain  HPI Brian Porter is a 51 y.o. male presents emergency department with a one-month history of left groin pain and with a 2 day history of increasing pain which prompted him to come to the emergency department today. Surgery consult requested by Dr.Quale.  She reports she's had a prior hernia repair in the pain feels just like that. He states he's been eating well and denies any fevers, chills, nausea, vomiting, chest pain, shortness of breath, diarrhea, constipation. Patient does note that the area gets larger and worse when he tries to stand or lifting anything heavy. He is otherwise in his usual state of health. He reports a prior hernia repair 8 years ago at [facility].  HPI  Past Medical History:  Diagnosis Date  . CAD (coronary artery disease)   . GERD (gastroesophageal reflux disease)   . Hypertension   . MI, old   . Sleep apnea     Past Surgical History:  Procedure Laterality Date  . CARDIAC CATHETERIZATION Right 10/16/2015   Procedure: Left Heart Cath and Coronary Angiography;  Surgeon: Laurier NancyShaukat A Khan, MD;  Location: [facility] INVASIVE CV LAB;  Service: Cardiovascular;  Laterality: Right;  . CORONARY ANGIOPLASTY WITH STENT PLACEMENT  approx 2 years ago  . PARTIAL NEPHRECTOMY Left   . SPLENECTOMY      Family History  Problem Relation Age of Onset  . Heart failure Mother   . Hypertension Mother   . Diabetes Mellitus II Brother     Social History Social History  Substance Use Topics  . Smoking status: Current Every Day Smoker    Packs/day: 1.00    Types: Cigarettes  . Smokeless tobacco: Never Used  . Alcohol use No    No Known Allergies  No current facility-administered medications for this encounter.    Current Outpatient Prescriptions  Medication Sig Dispense Refill  . aspirin 81 MG EC tablet Take 1 tablet (81 mg total) by mouth daily. 30 tablet 2  . furosemide (LASIX) 20 MG  tablet Take 2 tablets (40 mg total) by mouth daily. (Patient taking differently: Take 20 mg by mouth daily. ) 30 tablet 3  . potassium chloride (K-DUR) 10 MEQ tablet Take 1 tablet (10 mEq total) by mouth daily. 30 tablet 1  . ranitidine (ZANTAC) 150 MG tablet Take 150 mg by mouth 2 (two) times daily as needed for heartburn.    . spironolactone (ALDACTONE) 25 MG tablet Take 1 tablet (25 mg total) by mouth daily. 30 tablet 2  . carvedilol (COREG) 6.25 MG tablet Take 1 tablet (6.25 mg total) by mouth 2 (two) times daily with a meal. (Patient not taking: Reported on 11/23/2016) 60 tablet 5     Review of Systems A Multi-point review of systems was asked and was negative except for the findings documented in the history of present illness  Physical Exam Blood pressure 123/83, pulse 90, temperature 97.2 F (36.2 C), temperature source Oral, resp. rate 18, height 5\' 9"  (1.753 m), weight 83.9 kg (185 lb), SpO2 99 %. CONSTITUTIONAL: No acute distress. EYES: Pupils are equal, round, and reactive to light, Sclera are non-icteric. EARS, NOSE, MOUTH AND THROAT: The oropharynx is clear. The oral mucosa is pink and moist. Hearing is intact to voice. LYMPH NODES:  Lymph nodes in the neck are normal. RESPIRATORY:  Lungs are clear. There is normal respiratory effort, with equal breath sounds bilaterally,  and without pathologic use of accessory muscles. CARDIOVASCULAR: Heart is regular without murmurs, gallops, or rubs. GI: The abdomen is soft, and nondistended. There well-healed prior midline and left groin incisions. Palpable bilateral inguinal hernias. They are tender to palpation but reducible. There is no hepatosplenomegaly. There are normal bowel sounds in all quadrants. GU: Rectal deferred.   MUSCULOSKELETAL: Normal muscle strength and tone. No cyanosis or edema.   SKIN: Turgor is good and there are no pathologic skin lesions or ulcers. NEUROLOGIC: Motor and sensation is grossly normal. Cranial nerves are  grossly intact. PSYCH:  Oriented to person, place and time. Affect is normal.  Data Reviewed Images and labs reviewed, labs show mild leukocytosis of 11.2 as well as some decreased transaminases but otherwise within normal limits. Ultrasound shows evidence of a recurrent inguinal hernia on the left that appear to be bowel containing. I have personally reviewed the patient's imaging, laboratory findings and medical records.    Assessment    Bilateral inguinal hernias    Plan    51 year old male with bilateral inguinal hernias on exam. They are reducible. Discussed with the patient the diagnosis and treatment options. Given that they're reducible discussed with the patient that he could be discharged home with follow-up in our clinic to schedule an outpatient repair. Otherwise discussed the possibility of returning him to [facility] as that is where his prior hernia repairs were performed. Discussed the signs and symptoms of incarceration or strength relation and to return the emergency department immediately. Otherwise he will follow up at our clinic on Wednesday morning at 11:30 to see me to schedule an elective repair. Patient voiced understanding.     Time spent with the patient was 60 minutes, with more than 50% of the time spent in face-to-face education, counseling and care coordination.     Ricarda Frame, MD FACS General Surgeon 11/23/2016, 2:22 PM

## 2016-11-23 NOTE — ED Notes (Signed)
Patient transported to Ultrasound 

## 2016-11-23 NOTE — Discharge Instructions (Signed)
? ?  Please return to the emergency room right away if you are to develop a fever, severe nausea, your pain becomes severe or worsens, you are unable to keep food down, begin vomiting any dark or bloody fluid, you develop any dark or bloody stools, feel dehydrated, or other new concerns or symptoms arise. ? ?

## 2016-11-23 NOTE — ED Provider Notes (Signed)
[facility] Emergency Department Provider Note  ____________________________________________   First MD Initiated Contact with Patient 11/23/16 1135     (approximate)  I have reviewed the triage vital signs and the nursing notes.   HISTORY  Chief Complaint Groin Swelling    HPI Brian Porter is a 51 y.o. male who presents for evaluation of left lower abdominal pain  Patient reports her last 2-3 weeks she's been experiencing increasing intermittent pain in his left lower groin where his "hernia" used to be. Reports years ago he had a hernia repair in the left groin. Experiencing pain, occasionally noting swelling just into the left side of the groin, and its notably worse when he tries to stand up or while he is working or he pressure washes.  No nausea vomiting. Reports his pain is moderate to severe, seems to be worsening and becoming more consistent over the last few days. Denies any diarrhea or bloody bowel movements. No vomiting. No chest pain or trouble breathing. He is not most any redness or skin changes, but reports the area will seem to get swollen sometimes and then it goes down the pain seems to get slightly better.   Past Medical History:  Diagnosis Date  . CAD (coronary artery disease)   . GERD (gastroesophageal reflux disease)   . Hypertension   . MI, old   . Sleep apnea     Patient Active Problem List   Diagnosis Date Noted  . Acute on chronic systolic heart failure (HCC) 08/27/2016  . HTN (hypertension) 11/02/2015  . Tobacco abuse 11/02/2015  . Hand joint pain 11/02/2015  . CHF (congestive heart failure) (HCC) 10/15/2015  . Chest pain 02/22/2015  . Chest pain at rest 02/22/2015    Past Surgical History:  Procedure Laterality Date  . CARDIAC CATHETERIZATION Right 10/16/2015   Procedure: Left Heart Cath and Coronary Angiography;  Surgeon: Laurier Nancy, MD;  Location: [facility] INVASIVE CV LAB;  Service: Cardiovascular;  Laterality:  Right;  . CORONARY ANGIOPLASTY WITH STENT PLACEMENT  approx 2 years ago  . PARTIAL NEPHRECTOMY Left   . SPLENECTOMY      Prior to Admission medications   Medication Sig Start Date End Date Taking? Authorizing Provider  aspirin 81 MG EC tablet Take 1 tablet (81 mg total) by mouth daily. 08/29/16  Yes Houston Siren, MD  furosemide (LASIX) 20 MG tablet Take 2 tablets (40 mg total) by mouth daily. Patient taking differently: Take 20 mg by mouth daily.  08/29/16 08/29/17 Yes Sainani, Rolly Pancake, MD  potassium chloride (K-DUR) 10 MEQ tablet Take 1 tablet (10 mEq total) by mouth daily. 08/29/16  Yes Houston Siren, MD  ranitidine (ZANTAC) 150 MG tablet Take 150 mg by mouth 2 (two) times daily as needed for heartburn.   Yes [provider]  spironolactone (ALDACTONE) 25 MG tablet Take 1 tablet (25 mg total) by mouth daily. 08/29/16  Yes Houston Siren, MD  carvedilol (COREG) 6.25 MG tablet Take 1 tablet (6.25 mg total) by mouth 2 (two) times daily with a meal. Patient not taking: Reported on 11/23/2016 10/07/16   Delma Freeze, FNP    Allergies Patient has no known allergies.  Family History  Problem Relation Age of Onset  . Heart failure Mother   . Hypertension Mother   . Diabetes Mellitus II Brother     Social History Social History  Substance Use Topics  . Smoking status: Current Every Day Smoker  Packs/day: 1.00    Types: Cigarettes  . Smokeless tobacco: Never Used  . Alcohol use No    Review of Systems Constitutional: No fever/chills Eyes: No visual changes. ENT: No sore throat. Cardiovascular: Denies chest pain. Respiratory: Denies shortness of breath. Gastrointestinal: See history of present illness Genitourinary: Negative for dysuria. Musculoskeletal: Negative for back pain. Skin: Negative for rash. Neurological: Negative for headaches, focal weakness or numbness.  10-point ROS otherwise negative.  ____________________________________________   PHYSICAL  EXAM:  VITAL SIGNS: ED Triage Vitals [11/23/16 1131]  Enc Vitals Group     BP 123/83     Pulse Rate 90     Resp 18     Temp 97.2 F (36.2 C)     Temp Source Oral     SpO2 99 %     Weight 185 lb (83.9 kg)     Height 5\' 9"  (1.753 m)     Head Circumference      Peak Flow      Pain Score 8     Pain Loc      Pain Edu?      Excl. in GC?     Constitutional: Alert and oriented. Well appearing and in no acute distressHowever when he goes to sit up or stand he does look to be in pain and reports the pain in her left groin. Eyes: Conjunctivae are normal. PERRL. EOMI. Head: Atraumatic. Nose: No congestion/rhinnorhea. Mouth/Throat: Mucous membranes are moist.  Oropharynx non-erythematous. Neck: No stridor.   Cardiovascular: Normal rate, regular rhythm. Grossly normal heart sounds.  Good peripheral circulation. Respiratory: Normal respiratory effort.  No retractions. Lungs CTAB. Gastrointestinal: Soft and nontender except in the left lower quadrant where he has moderate pain to palpation, but no pain to percussion or rebound. There is no overlying skin change. He has a soft reducible umbilical hernia which she reports been present for years and unchanged. Bilateral inguinal canals are nontender without mass, except he does report pain somewhat without a palpable mass notable in the left superior portion of the inguinal canal. No distention. No abdominal bruits. No CVA tenderness. The scrotum and testicles and penis appear normal. Musculoskeletal: No lower extremity tenderness nor edema.   Neurologic:  Normal speech and language. No gross focal neurologic deficits are appreciated. Skin:  Skin is warm, dry and intact. No rash noted. Psychiatric: Mood and affect are normal. Speech and behavior are normal.  ____________________________________________   LABS (all labs ordered are listed, but only abnormal results are displayed)  Labs Reviewed  CBC - Abnormal; Notable for the following:        Result Value   WBC 11.2 (*)    RDW 15.0 (*)    All other components within normal limits  COMPREHENSIVE METABOLIC PANEL - Abnormal; Notable for the following:    Glucose, Bld 100 (*)    Calcium 8.7 (*)    AST 14 (*)    ALT 11 (*)    All other components within normal limits  LIPASE, BLOOD  PROTIME-INR   ____________________________________________  EKG   ____________________________________________  RADIOLOGY  [place] Pelvis Limited  Result Date: 11/23/2016 CLINICAL DATA:  Left lower quadrant pain for 1 month. EXAM: LIMITED ULTRASOUND OF PELVIS TECHNIQUE: Limited transabdominal ultrasound examination of the pelvis was performed. COMPARISON:  12/10/2014 FINDINGS: Anatomic area evaluated:  Left lower quadrant of the pelvis Hernia: Yes. Suspect recurrent left inguinal hernia which contains bowel. This measures approximately 3.8 x 1.2 x 3.2 cm. Other: Prominent lymph node identified  within the left inguinal region measuring 3.9 x 1.4 x 0.9 cm. IMPRESSION: 1. Complex mass in the area of concern within the left inguinal region noted and suspicious for recurrent hernia containing bowel. Electronically Signed   By: Signa Kell M.D.   On: 11/23/2016 12:59    ____________________________________________   PROCEDURES  Procedure(s) performed: None  Procedures  Critical Care performed: No  ____________________________________________   INITIAL IMPRESSION / ASSESSMENT AND PLAN / ED COURSE  Pertinent labs & imaging results that were available during my care of the patient were reviewed by me and considered in my medical decision making (see chart for details).   ----------------------------------------- 11:51 AM on 11/23/2016 -----------------------------------------  Discussed case with Dr. Kandace Blitz, general surgeon. He recommends obtaining an ultrasound to evaluate for possible hernia recurrence in the left lower quadrant. My primary concern is recurrent hernia, though a broad  differential is also considered we will first start with workup including labs and ultrasound to evaluate further for possible hernia. The patient is hemodynamically stable. He did not drive himself, and he is agreeable to receiving pain medication here with the plan not to be discharged on based on previous history of opioid abuse which she freely admits to. Clinical Course as of Nov 23 1416  Sat Nov 23, 2016  1323 Consult placed to Dr. Tonita Cong who will see patient in the ER.  [MQ]    Clinical Course User Index [MQ] Sharyn Creamer, MD    ----------------------------------------- 2:17 PM on 11/23/2016 -----------------------------------------  Patient resting comfortably. Seen by surgery, he will be following up on Wednesday. Return precautions advised, and patient agreeable to seeing surgery Wednesday morning.  Return precautions and treatment recommendations and follow-up discussed with the patient who is agreeable with the plan.  ____________________________________________   FINAL CLINICAL IMPRESSION(S) / ED DIAGNOSES  Final diagnoses:  Reducible left inguinal hernia      NEW MEDICATIONS STARTED DURING THIS VISIT:  New Prescriptions   No medications on file     Note:  This document was prepared using Dragon voice recognition software and may include unintentional dictation errors.     Sharyn Creamer, MD 11/23/16 [id number]

## 2016-11-23 NOTE — ED Triage Notes (Signed)
Pt to ed with c/o left side groin swelling intermittently x 2 weeks.  Pt states hx of hernia.

## 2016-11-23 NOTE — ED Notes (Signed)

## 2016-11-27 ENCOUNTER — Other Ambulatory Visit: Payer: Self-pay

## 2016-11-27 ENCOUNTER — Ambulatory Visit: Payer: Self-pay | Admitting: General Surgery

## 2016-11-27 ENCOUNTER — Telehealth: Payer: Self-pay | Admitting: General Practice

## 2016-11-27 NOTE — Telephone Encounter (Signed)
I have called the patient and left a voicemail for the patient to return my call for his no showed appointment on 11/27/16 if he calls back try and get him back on the schedule, for no showed appointment.

## 2016-11-29 HISTORY — PX: HERNIA REPAIR: SHX51

## 2017-01-13 ENCOUNTER — Encounter: Payer: Self-pay | Admitting: Family

## 2017-01-13 ENCOUNTER — Ambulatory Visit: Payer: Self-pay | Attending: Family | Admitting: Family

## 2017-01-13 VITALS — BP 104/66 | HR 92 | Resp 20 | Ht 70.0 in | Wt 196.4 lb

## 2017-01-13 DIAGNOSIS — Z720 Tobacco use: Secondary | ICD-10-CM

## 2017-01-13 DIAGNOSIS — K219 Gastro-esophageal reflux disease without esophagitis: Secondary | ICD-10-CM | POA: Insufficient documentation

## 2017-01-13 DIAGNOSIS — I252 Old myocardial infarction: Secondary | ICD-10-CM | POA: Insufficient documentation

## 2017-01-13 DIAGNOSIS — Z955 Presence of coronary angioplasty implant and graft: Secondary | ICD-10-CM | POA: Insufficient documentation

## 2017-01-13 DIAGNOSIS — F17210 Nicotine dependence, cigarettes, uncomplicated: Secondary | ICD-10-CM | POA: Insufficient documentation

## 2017-01-13 DIAGNOSIS — I5022 Chronic systolic (congestive) heart failure: Secondary | ICD-10-CM | POA: Insufficient documentation

## 2017-01-13 DIAGNOSIS — I10 Essential (primary) hypertension: Secondary | ICD-10-CM

## 2017-01-13 DIAGNOSIS — K4090 Unilateral inguinal hernia, without obstruction or gangrene, not specified as recurrent: Secondary | ICD-10-CM | POA: Insufficient documentation

## 2017-01-13 DIAGNOSIS — Z9889 Other specified postprocedural states: Secondary | ICD-10-CM | POA: Insufficient documentation

## 2017-01-13 DIAGNOSIS — G4733 Obstructive sleep apnea (adult) (pediatric): Secondary | ICD-10-CM | POA: Insufficient documentation

## 2017-01-13 DIAGNOSIS — I110 Hypertensive heart disease with heart failure: Secondary | ICD-10-CM | POA: Insufficient documentation

## 2017-01-13 DIAGNOSIS — Z833 Family history of diabetes mellitus: Secondary | ICD-10-CM | POA: Insufficient documentation

## 2017-01-13 DIAGNOSIS — Z7982 Long term (current) use of aspirin: Secondary | ICD-10-CM | POA: Insufficient documentation

## 2017-01-13 DIAGNOSIS — Z79899 Other long term (current) drug therapy: Secondary | ICD-10-CM | POA: Insufficient documentation

## 2017-01-13 DIAGNOSIS — R0602 Shortness of breath: Secondary | ICD-10-CM | POA: Insufficient documentation

## 2017-01-13 DIAGNOSIS — I2510 Atherosclerotic heart disease of native coronary artery without angina pectoris: Secondary | ICD-10-CM | POA: Insufficient documentation

## 2017-01-13 DIAGNOSIS — Z9081 Acquired absence of spleen: Secondary | ICD-10-CM | POA: Insufficient documentation

## 2017-01-13 DIAGNOSIS — Z8249 Family history of ischemic heart disease and other diseases of the circulatory system: Secondary | ICD-10-CM | POA: Insufficient documentation

## 2017-01-13 DIAGNOSIS — Z905 Acquired absence of kidney: Secondary | ICD-10-CM | POA: Insufficient documentation

## 2017-01-13 MED ORDER — FUROSEMIDE 40 MG PO TABS: 40.0000 mg | ORAL_TABLET | Freq: Every day | ORAL | 5 refills | Status: DC

## 2017-01-13 MED ORDER — POTASSIUM CHLORIDE ER 10 MEQ PO TBCR: 10.0000 meq | EXTENDED_RELEASE_TABLET | Freq: Every day | ORAL | 5 refills | Status: DC

## 2017-01-13 MED ORDER — METOPROLOL SUCCINATE ER 25 MG PO TB24: 25.0000 mg | ORAL_TABLET | Freq: Every day | ORAL | 5 refills | Status: DC

## 2017-01-13 MED ORDER — SPIRONOLACTONE 25 MG PO TABS: 25.0000 mg | ORAL_TABLET | Freq: Every day | ORAL | 5 refills | Status: DC

## 2017-01-13 NOTE — Progress Notes (Signed)
Patient ID: Brian Porter, male    DOB: 16-May-1966, 51 y.o.   MRN: [id number]  HPI  Brian Porter is a 51 y/o male with a history of obstructive sleep apnea (without CPAP), CAD, HTN, GERD, MI, current tobacco use and chronic heart failure.   Last echo was done 10/15/15 and showed an EF of 35% along with moderate Brian/TR and an elevated PA pressure of 48 mm Hg. Had a cardiac catheterization done 10/16/15 and showed an EF of 15% along with multi-vessel disease. Recommended treatment for cardiomyopathy.   Was in the ED 11/27/16 due to pain s/p hernia repair.  Was in the ED 11/23/16 due to left lower abdominal pain due to inguinal hernia. Evaluated by surgery and discharged home.  Admitted 08/27/16 with HF exacerbation. Initially treated with IV diuretics. Other medications were adjusted and he was discharged home after 2 days. Was in the ED on 03/30/16 for opiate withdrawal. Was in the ED on 03/27/16 for opiate detox.  He presents today for a follow-up visit with a chief complaint of mild shortness of breath upon moderate exertion. He says that this has been present for many years with varying levels of severity. He says that it's currently occurring on an intermittent basis. He has associated fatigue along with this.   Past Medical History:  Diagnosis Date  . CAD (coronary artery disease)   . GERD (gastroesophageal reflux disease)   . Hypertension   . MI, old   . Sleep apnea    Past Surgical History:  Procedure Laterality Date  . CARDIAC CATHETERIZATION Right 10/16/2015   Procedure: Left Heart Cath and Coronary Angiography;  Surgeon: Laurier Nancy, MD;  Location: [facility] INVASIVE CV LAB;  Service: Cardiovascular;  Laterality: Right;  . CORONARY ANGIOPLASTY WITH STENT PLACEMENT  approx 2 years ago  . PARTIAL NEPHRECTOMY Left   . SPLENECTOMY     Family History  Problem Relation Age of Onset  . Heart failure Mother   . Hypertension Mother   . Diabetes Mellitus II Brother    Social History  Substance  Use Topics  . Smoking status: Current Every Day Smoker    Packs/day: 1.00    Types: Cigarettes  . Smokeless tobacco: Never Used  . Alcohol use No   No Known Allergies  Prior to Admission medications   Medication Sig Start Date End Date Taking? Authorizing Provider  aspirin 81 MG EC tablet Take 1 tablet (81 mg total) by mouth daily. 08/29/16  Yes Houston Siren, MD  furosemide (LASIX) 20 MG tablet Take 2 tablets (40 mg total) by mouth daily. 08/29/16 08/29/17 Yes Sainani, Rolly Pancake, MD  potassium chloride (K-DUR) 10 MEQ tablet Take 1 tablet (10 mEq total) by mouth daily. 08/29/16  Yes Houston Siren, MD  ranitidine (ZANTAC) 150 MG tablet Take 150 mg by mouth 2 (two) times daily as needed for heartburn.   Yes [provider]  spironolactone (ALDACTONE) 25 MG tablet Take 1 tablet (25 mg total) by mouth daily. 08/29/16  Yes Houston Siren, MD     Review of Systems  Constitutional: Positive for fatigue (improving). Negative for appetite change.  HENT: Negative for congestion and postnasal drip.   Eyes: Negative.   Respiratory: Positive for shortness of breath (at times). Negative for cough, chest tightness and wheezing.   Cardiovascular: Negative for chest pain, palpitations and leg swelling.  Gastrointestinal: Negative for abdominal distention and abdominal pain.  Endocrine: Negative.   Genitourinary: Negative.   Musculoskeletal:  Negative for arthralgias and back pain.  Skin: Negative.   Allergic/Immunologic: Negative.   Neurological: Negative for dizziness and light-headedness.  Hematological: Negative for adenopathy. Does not bruise/bleed easily.  Psychiatric/Behavioral: Negative for dysphoric mood, sleep disturbance (sleeping on 2 pillows) and suicidal ideas. The patient is not nervous/anxious.    Vitals:   01/13/17 1215  BP: 104/66  Pulse: 92  Resp: 20  SpO2: 98%  Weight: 196 lb 6 oz (89.1 kg)  Height: 5\' 10"  (1.778 m)   Wt Readings from Last 3 Encounters:  01/13/17  196 lb 6 oz (89.1 kg)  11/23/16 185 lb (83.9 kg)  10/07/16 192 lb 4 oz (87.2 kg)    Lab Results  Component Value Date   CREATININE 0.81 11/23/2016   CREATININE 0.90 08/29/2016   CREATININE 0.87 08/28/2016    Physical Exam  Constitutional: He is oriented to person, place, and time. He appears well-developed and well-nourished.  HENT:  Head: Normocephalic and atraumatic.  Neck: Normal range of motion. Neck supple. No JVD present.  Cardiovascular: Normal rate and regular rhythm.   Pulmonary/Chest: Effort normal. He has no wheezes. He has no rales.  Abdominal: Soft. He exhibits no distension. There is no tenderness.  Musculoskeletal: He exhibits no edema or tenderness.  Neurological: He is alert and oriented to person, place, and time.  Skin: Skin is warm and dry.  Psychiatric: He has a normal mood and affect. His behavior is normal. Thought content normal.  Nursing note and vitals reviewed.     Assessment & Plan:   1: Chronic heart failure with reduced ejection fraction- - NYHA class II - euvolemic - already weighing daily. Reminded to call for an overnight weight gain of >2 pounds or a weekly weight gain of >5 pounds.  - hasn't been taking his carvedilol as he says that it caused "stomach pain". Will try metoprolol succinate 25mg  once daily instead.  - is back taking furosemide 40mg  daily; may try decreasing dosage again in the future to allow for his blood pressure to rise  - not adding salt to his food and tries to follow a 2000mg  sodium diet  2: HTN- - BP on the low side  - denies any dizziness - currently without insurance and has been seen at [facility] in the past. Contact information given to him regarding this so he can get re-established with them - reviewed BMP from 11/23/16 which showed potassium 4.4 and GFR of >60 - not interested in going to [facility] - information given to him regarding charity care   3: Tobacco use- - smokes 1 ppd of  cigarettes - complete cessation discussed for 3 minutes with him  Medication bottles were reviewed.  Return in 6 weeks or sooner for any questions/problems before then.

## 2017-01-13 NOTE — Patient Instructions (Addendum)
Continue weighing daily and call for an overnight weight gain of > 2 pounds or a weekly weight gain of >5 pounds.   [facility]   [phone]  [address] [place] [address]  Hours: Tuesday: 4:15 pm - 7:30 pm  Wednesday: 9 am - 1 pm  Thursday: 1 pm - 7:30 PM             Friday - Monday: CLOSED     Smoking Cessation Quitting smoking is important to your health and has many advantages. However, it is not always easy to quit since nicotine is a very addictive drug. Oftentimes, people try 3 times or more before being able to quit. This document explains the best ways for you to prepare to quit smoking. Quitting takes hard work and a lot of effort, but you can do it. ADVANTAGES OF QUITTING SMOKING  You will live longer, feel better, and live better.  Your body will feel the impact of quitting smoking almost immediately.  Within 20 minutes, blood pressure decreases. Your pulse returns to its normal level.  After 8 hours, carbon monoxide levels in the blood return to normal. Your oxygen level increases.  After 24 hours, the chance of having a heart attack starts to decrease. Your breath, hair, and body stop smelling like smoke.  After 48 hours, damaged nerve endings begin to recover. Your sense of taste and smell improve.  After 72 hours, the body is virtually free of nicotine. Your bronchial tubes relax and breathing becomes easier.  After 2 to 12 weeks, lungs can hold more air. Exercise becomes easier and circulation improves.  The risk of having a heart attack, stroke, cancer, or lung disease is greatly reduced.  After 1 year, the risk of coronary heart disease is cut in half.  After 5 years, the risk of stroke falls to the same as a nonsmoker.  After 10 years, the risk of lung cancer is cut in half and the risk of other cancers decreases significantly.  After 15 years, the risk of coronary heart disease drops, usually to the level of a nonsmoker.  If  you are pregnant, quitting smoking will improve your chances of having a healthy baby.  The people you live with, especially any children, will be healthier.  You will have extra money to spend on things other than cigarettes. QUESTIONS TO THINK ABOUT BEFORE ATTEMPTING TO QUIT You may want to talk about your answers with your health care provider.  Why do you want to quit?  If you tried to quit in the past, what helped and what did not?  What will be the most difficult situations for you after you quit? How will you plan to handle them?  Who can help you through the tough times? Your family? Friends? A health care provider?  What pleasures do you get from smoking? What ways can you still get pleasure if you quit? Here are some questions to ask your health care provider:  How can you help me to be successful at quitting?  What medicine do you think would be best for me and how should I take it?  What should I do if I need more help?  What is smoking withdrawal like? How can I get information on withdrawal? GET READY  Set a quit date.  Change your environment by getting rid of all cigarettes, ashtrays, matches, and lighters in your home, car, or work. Do not let people smoke in your  home.  Review your past attempts to quit. Think about what worked and what did not. GET SUPPORT AND ENCOURAGEMENT You have a better chance of being successful if you have help. You can get support in many ways.  Tell your family, friends, and coworkers that you are going to quit and need their support. Ask them not to smoke around you.  Get individual, group, or telephone counseling and support. Programs are available at [facility] and health centers. Call your local health department for information about programs in your area.  Spiritual beliefs and practices may help some smokers quit.  Download a "quit meter" on your computer to keep track of quit statistics, such as how long you have gone  without smoking, cigarettes not smoked, and money saved.  Get a self-help book about quitting smoking and staying off tobacco. LEARN NEW SKILLS AND BEHAVIORS  Distract yourself from urges to smoke. Talk to someone, go for a walk, or occupy your time with a task.  Change your normal routine. Take a different route to work. Drink tea instead of coffee. Eat breakfast in a different place.  Reduce your stress. Take a hot bath, exercise, or read a book.  Plan something enjoyable to do every day. Reward yourself for not smoking.  Explore interactive web-based programs that specialize in helping you quit. GET MEDICINE AND USE IT CORRECTLY Medicines can help you stop smoking and decrease the urge to smoke. Combining medicine with the above behavioral methods and support can greatly increase your chances of successfully quitting smoking.  Nicotine replacement therapy helps deliver nicotine to your body without the negative effects and risks of smoking. Nicotine replacement therapy includes nicotine gum, lozenges, inhalers, nasal sprays, and skin patches. Some may be available over-the-counter and others require a prescription.  Antidepressant medicine helps people abstain from smoking, but how this works is unknown. This medicine is available by prescription.  Nicotinic receptor partial agonist medicine simulates the effect of nicotine in your brain. This medicine is available by prescription. Ask your health care provider for advice about which medicines to use and how to use them based on your health history. Your health care provider will tell you what side effects to look out for if you choose to be on a medicine or therapy. Carefully read the information on the package. Do not use any other product containing nicotine while using a nicotine replacement product.  RELAPSE OR DIFFICULT SITUATIONS Most relapses occur within the first 3 months after quitting. Do not be discouraged if you start smoking  again. Remember, most people try several times before finally quitting. You may have symptoms of withdrawal because your body is used to nicotine. You may crave cigarettes, be irritable, feel very hungry, cough often, get headaches, or have difficulty concentrating. The withdrawal symptoms are only temporary. They are strongest when you first quit, but they will go away within 10-14 days. To reduce the chances of relapse, try to:  Avoid drinking alcohol. Drinking lowers your chances of successfully quitting.  Reduce the amount of caffeine you consume. Once you quit smoking, the amount of caffeine in your body increases and can give you symptoms, such as a rapid heartbeat, sweating, and anxiety.  Avoid smokers because they can make you want to smoke.  Do not let weight gain distract you. Many smokers will gain weight when they quit, usually less than 10 pounds. Eat a healthy diet and stay active. You can always lose the weight gained after you quit.  Find ways to improve your mood other than smoking. FOR MORE INFORMATION  www.smokefree.gov  Document Released: 06/11/2001 Document Revised: 11/01/2013 Document Reviewed: 09/26/2011 [employer] Patient Information 2015 [place], [place]. This information is not intended to replace advice given to you by your health care provider. Make sure you discuss any questions you have with your health care provider.

## 2017-02-24 ENCOUNTER — Ambulatory Visit: Payer: Self-pay | Admitting: Family

## 2017-02-24 ENCOUNTER — Telehealth: Payer: Self-pay | Admitting: Family

## 2017-02-24 NOTE — Telephone Encounter (Signed)
Patient did not show for his [facility] appointment on 02/24/17. Will attempt to reschedule.

## 2017-05-16 ENCOUNTER — Encounter: Payer: Self-pay | Admitting: Emergency Medicine

## 2017-05-16 ENCOUNTER — Emergency Department
Admission: EM | Admit: 2017-05-16 | Discharge: 2017-05-16 | Disposition: A | Discharge status: Home / Self Care | Payer: Self-pay | Attending: Emergency Medicine | Admitting: Emergency Medicine

## 2017-05-16 ENCOUNTER — Emergency Department: Payer: Self-pay

## 2017-05-16 DIAGNOSIS — Y939 Activity, unspecified: Secondary | ICD-10-CM | POA: Insufficient documentation

## 2017-05-16 DIAGNOSIS — S62525A Nondisplaced fracture of distal phalanx of left thumb, initial encounter for closed fracture: Secondary | ICD-10-CM | POA: Insufficient documentation

## 2017-05-16 DIAGNOSIS — I5023 Acute on chronic systolic (congestive) heart failure: Secondary | ICD-10-CM | POA: Insufficient documentation

## 2017-05-16 DIAGNOSIS — W231XXA Caught, crushed, jammed, or pinched between stationary objects, initial encounter: Secondary | ICD-10-CM | POA: Insufficient documentation

## 2017-05-16 DIAGNOSIS — S62525B Nondisplaced fracture of distal phalanx of left thumb, initial encounter for open fracture: Secondary | ICD-10-CM

## 2017-05-16 DIAGNOSIS — Y999 Unspecified external cause status: Secondary | ICD-10-CM | POA: Insufficient documentation

## 2017-05-16 DIAGNOSIS — F17210 Nicotine dependence, cigarettes, uncomplicated: Secondary | ICD-10-CM | POA: Insufficient documentation

## 2017-05-16 DIAGNOSIS — I252 Old myocardial infarction: Secondary | ICD-10-CM | POA: Insufficient documentation

## 2017-05-16 DIAGNOSIS — Z7982 Long term (current) use of aspirin: Secondary | ICD-10-CM | POA: Insufficient documentation

## 2017-05-16 DIAGNOSIS — Y929 Unspecified place or not applicable: Secondary | ICD-10-CM | POA: Insufficient documentation

## 2017-05-16 DIAGNOSIS — I2510 Atherosclerotic heart disease of native coronary artery without angina pectoris: Secondary | ICD-10-CM | POA: Insufficient documentation

## 2017-05-16 DIAGNOSIS — I110 Hypertensive heart disease with heart failure: Secondary | ICD-10-CM | POA: Insufficient documentation

## 2017-05-16 DIAGNOSIS — Z79899 Other long term (current) drug therapy: Secondary | ICD-10-CM | POA: Insufficient documentation

## 2017-05-16 DIAGNOSIS — Z955 Presence of coronary angioplasty implant and graft: Secondary | ICD-10-CM | POA: Insufficient documentation

## 2017-05-16 HISTORY — DX: Heart failure, unspecified: I50.9

## 2017-05-16 IMAGING — DX DG FINGER THUMB 2+V*L*
3 series · 3 of 3 positions shown · non-contrast
Comparison: None.

CLINICAL DATA: Crush injury with laceration

EXAM:
LEFT THUMB 2+V

[finger ap]
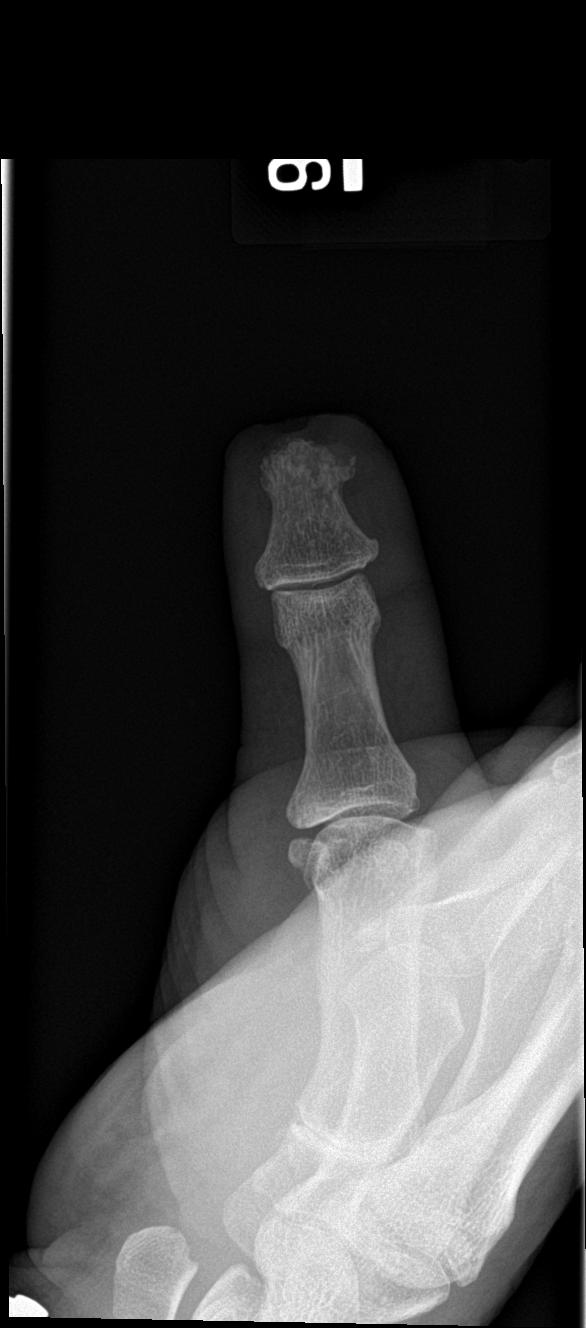

[finger obl]
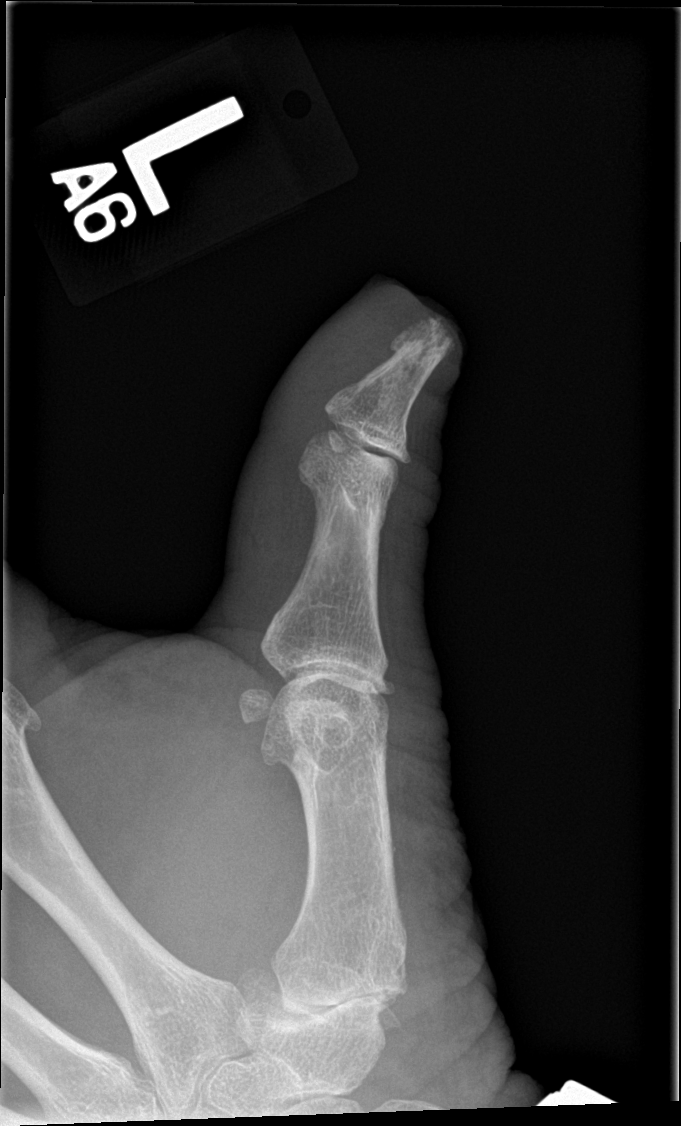

[finger lat]
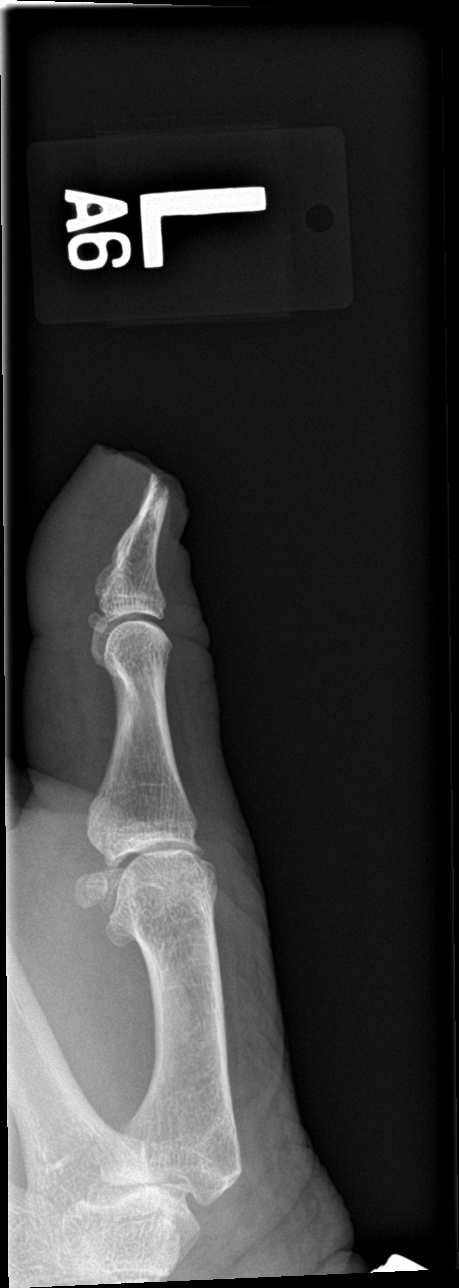

[3 of 3 positions shown; findings below may reference images not displayed]

FINDINGS: Frontal, oblique, and lateral views were obtained. There is been
amputation of the soft tissues of the distal aspect of the first
digit. There is a fracture of the distal most aspect of the first
distal phalanx with bony fragment displaced laterally with respect
to the distal aspect of the first distal phalanx. No other fracture
no dislocation. There is slight osteoarthritic change in the first
MCP joint.
IMPRESSION: Amputation distal aspect first digit with avulsion along the distal
aspect of the first distal phalanx with small bony fragment located
laterally. No other fracture. No dislocation. Osteoarthritic change
noted in the first MCP joint.

## 2017-05-16 MED ORDER — SULFAMETHOXAZOLE-TRIMETHOPRIM 800-160 MG PO TABS: 1.0000 | ORAL_TABLET | Freq: Two times a day (BID) | ORAL | 0 refills | Status: DC

## 2017-05-16 MED ORDER — NAPROXEN 500 MG PO TABS
500.0000 mg | ORAL_TABLET | Freq: Once | ORAL | Status: AC
  Administered 2017-05-16: 500 mg via ORAL
  Filled 2017-05-16: qty 1

## 2017-05-16 MED ORDER — SULFAMETHOXAZOLE-TRIMETHOPRIM 800-160 MG PO TABS
1.0000 | ORAL_TABLET | Freq: Once | ORAL | Status: AC
  Administered 2017-05-16: 1 via ORAL
  Filled 2017-05-16: qty 1

## 2017-05-16 MED ORDER — LIDOCAINE HCL (PF) 1 % IJ SOLN: 5.0000 mL | Freq: Once | INTRAMUSCULAR | Status: DC

## 2017-05-16 MED ORDER — OXYCODONE-ACETAMINOPHEN 5-325 MG PO TABS
1.0000 | ORAL_TABLET | Freq: Once | ORAL | Status: AC
  Administered 2017-05-16: 1 via ORAL
  Filled 2017-05-16: qty 1

## 2017-05-16 MED ORDER — LORAZEPAM 2 MG/ML IJ SOLN
2.0000 mg | Freq: Once | INTRAMUSCULAR | Status: AC
  Administered 2017-05-16: 2 mg via INTRAMUSCULAR
  Filled 2017-05-16: qty 1

## 2017-05-16 MED ORDER — NAPROXEN 500 MG PO TABS: 500.0000 mg | ORAL_TABLET | Freq: Two times a day (BID) | ORAL | Status: DC

## 2017-05-16 MED ORDER — LIDOCAINE HCL (PF) 1 % IJ SOLN
INTRAMUSCULAR | Status: AC
  Filled 2017-05-16: qty 5

## 2017-05-16 MED ORDER — OXYCODONE-ACETAMINOPHEN 5-325 MG PO TABS: 1.0000 | ORAL_TABLET | Freq: Four times a day (QID) | ORAL | 0 refills | Status: DC | PRN

## 2017-05-16 NOTE — Discharge Instructions (Addendum)
Keep bandaging take medication to evaluation by orthopedics.

## 2017-05-16 NOTE — ED Notes (Signed)
XR in room 

## 2017-05-16 NOTE — ED Triage Notes (Signed)
Pt comes into the Ed via POV c/o laceration to the left thumb.  Patient has all bleeding under control at this time.  Patient cut his finger on a trailer hitch.  Patient in NAD at this time.

## 2017-05-16 NOTE — ED Provider Notes (Signed)
[facility] Emergency Department Provider Note   ____________________________________________   First MD Initiated Contact with Patient 05/16/17 1249     (approximate)  I have reviewed the triage vital signs and the nursing notes.   HISTORY  Chief Complaint Laceration    HPI Brian Porter is a 51 y.o. male left thumb pain secondary to contusion or laceration. Patient state finger was smashed and cut by a trailer hitch. Patient denies loss of sensation or loss of movement. Patient very anxious. Patient rates pain as a 9/10. Except for pressure dressing no other palliative measures prior to arrival.  Past Medical History:  Diagnosis Date  . CAD (coronary artery disease)   . CHF (congestive heart failure) (HCC)   . GERD (gastroesophageal reflux disease)   . Hypertension   . MI, old   . Sleep apnea     Patient Active Problem List   Diagnosis Date Noted  . Bilateral recurrent inguinal hernia without obstruction or gangrene   . Acute on chronic systolic heart failure (HCC) 08/27/2016  . HTN (hypertension) 11/02/2015  . Tobacco abuse 11/02/2015  . Hand joint pain 11/02/2015  . CHF (congestive heart failure) (HCC) 10/15/2015  . Chest pain 02/22/2015  . Chest pain at rest 02/22/2015    Past Surgical History:  Procedure Laterality Date  . CORONARY ANGIOPLASTY WITH STENT PLACEMENT  approx 2 years ago  . HERNIA REPAIR Left 11/29/2016   [facility]  . Left Heart Cath and Coronary Angiography Right 10/16/2015   Performed by Laurier Nancy, MD at [facility] INVASIVE CV LAB  . PARTIAL NEPHRECTOMY Left   . SPLENECTOMY      Prior to Admission medications   Medication Sig Start Date End Date Taking? Authorizing Provider  aspirin 81 MG EC tablet Take 1 tablet (81 mg total) by mouth daily. 08/29/16   Houston Siren, MD  furosemide (LASIX) 40 MG tablet Take 1 tablet (40 mg total) by mouth daily. 01/13/17 01/13/18  Delma Freeze, FNP  metoprolol succinate (TOPROL XL)  25 MG 24 hr tablet Take 1 tablet (25 mg total) by mouth daily. 01/13/17   Delma Freeze, FNP  naproxen (NAPROSYN) 500 MG tablet Take 1 tablet (500 mg total) 2 (two) times daily with a meal by mouth. 05/16/17   Joni Reining, PA-C  oxyCODONE-acetaminophen (ROXICET) 5-325 MG tablet Take 1 tablet every 6 (six) hours as needed by mouth. 05/16/17 05/16/18  Joni Reining, PA-C  potassium chloride (K-DUR) 10 MEQ tablet Take 1 tablet (10 mEq total) by mouth daily. 01/13/17   Delma Freeze, FNP  ranitidine (ZANTAC) 150 MG tablet Take 150 mg by mouth 2 (two) times daily as needed for heartburn.    [provider]  spironolactone (ALDACTONE) 25 MG tablet Take 1 tablet (25 mg total) by mouth daily. 01/13/17   Delma Freeze, FNP  sulfamethoxazole-trimethoprim (BACTRIM DS,SEPTRA DS) 800-160 MG tablet Take 1 tablet 2 (two) times daily by mouth. 05/16/17   Joni Reining, PA-C    Allergies Patient has no known allergies.  Family History  Problem Relation Age of Onset  . Heart failure Mother   . Hypertension Mother   . Diabetes Mellitus II Brother     Social History Social History   Tobacco Use  . Smoking status: Current Every Day Smoker    Packs/day: 1.00    Types: Cigarettes  . Smokeless tobacco: Never Used  Substance Use Topics  . Alcohol use: No  Alcohol/week: 0.0 oz  . Drug use: Yes    Comment: percocet    Review of Systems Constitutional: No fever/chills Eyes: No visual changes. ENT: No sore throat. Cardiovascular: Denies chest pain. Respiratory: Denies shortness of breath. Gastrointestinal: No abdominal pain.  No nausea, no vomiting.  No diarrhea.  No constipation. Genitourinary: Negative for dysuria. Musculoskeletal: Right thumb pain Skin: Negative for rash. Distal left thumb laceration Neurological: Negative for headaches, focal weakness or numbness. Endocrine:Hypertension  ____________________________________________   PHYSICAL EXAM:  VITAL SIGNS: ED  Triage Vitals [05/16/17 1237]  Enc Vitals Group     BP (!) 129/98     Pulse Rate (!) 104     Resp 17     Temp 98.2 F (36.8 C)     Temp Source Oral     SpO2 97 %     Weight 190 lb (86.2 kg)     Height 5\' 10"  (1.778 m)     Head Circumference      Peak Flow      Pain Score 9     Pain Loc      Pain Edu?      Excl. in GC?     Constitutional: Alert and oriented. Anxious  Cardiovascular: Normal rate, regular rhythm. Grossly normal heart sounds.  Good peripheral circulation. Respiratory: Normal respiratory effort.  No retractions. Lungs CTAB. Musculoskeletal: Open tuft fracture left thumb. Neurologic:  Normal speech and language. No gross focal neurologic deficits are appreciated. No gait instability. Skin:  Skin is warm, dry and intact. No rash noted. Psychiatric: Mood and affect are normal. Speech and behavior are normal.  ____________________________________________   LABS (all labs ordered are listed, but only abnormal results are displayed)  Labs Reviewed - No data to display ____________________________________________  EKG   ____________________________________________  RADIOLOGY  Dg Finger Thumb Left  Result Date: 05/16/2017 CLINICAL DATA:  Crush injury with laceration EXAM: LEFT THUMB 2+V COMPARISON:  None. FINDINGS: Frontal, oblique, and lateral views were obtained. There is been amputation of the soft tissues of the distal aspect of the first digit. There is a fracture of the distal most aspect of the first distal phalanx with bony fragment displaced laterally with respect to the distal aspect of the first distal phalanx. No other fracture no dislocation. There is slight osteoarthritic change in the first MCP joint. IMPRESSION: Amputation distal aspect first digit with avulsion along the distal aspect of the first distal phalanx with small bony fragment located laterally. No other fracture. No dislocation. 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  Electronically Signed   By: Bretta BangWilliam  Woodruff III M.D.   On: 05/16/2017 13:28    ____________________________________________   PROCEDURES  Procedure(s) performed: None  Procedures  Critical Care performed: No  ____________________________________________   INITIAL IMPRESSION / ASSESSMENT AND PLAN / ED COURSE  As part of my medical decision making, I reviewed the following data within the electronic MEDICAL RECORD NUMBER    Open tuft fracture/laceration of the first digit left hand. Discussed x-ray finding with patient. Discussed rationale for not closing with this time. Patient given discharge care instructions and advised to follow orthopedics by calling for an appointment. Patient given a prescription for Bactrim DS, naproxen, and Percocets. Wound was cleaned and bandaged prior to departure.      ____________________________________________   FINAL CLINICAL IMPRESSION(S) / ED DIAGNOSES  Final diagnoses:  Open nondisplaced fracture of distal phalanx of left thumb, initial encounter     ED Discharge Orders  Ordered    sulfamethoxazole-trimethoprim (BACTRIM DS,SEPTRA DS) 800-160 MG tablet  2 times daily     05/16/17 1355    naproxen (NAPROSYN) 500 MG tablet  2 times daily with meals     05/16/17 1355    oxyCODONE-acetaminophen (ROXICET) 5-325 MG tablet  Every 6 hours PRN     05/16/17 1355       Note:  This document was prepared using Dragon voice recognition software and may include unintentional dictation errors.    Joni ReiningSmith, Ronald K, PA-C 05/16/17 1403    Nita SickleVeronese, [place], MD 05/21/17 [phone]

## 2017-05-16 NOTE — ED Notes (Signed)
Pt states he cut the tip of his left thumb on a trailer hitch today. Pt states the thumb is sore and throbbing.  Bleeding present at this time.  Elevated arm on pill. Explained to pt waiting for provider to evaluate.

## 2017-06-06 ENCOUNTER — Encounter: Payer: Self-pay | Admitting: Emergency Medicine

## 2017-06-06 ENCOUNTER — Emergency Department
Admission: EM | Admit: 2017-06-06 | Discharge: 2017-06-06 | Disposition: A | Discharge status: Home / Self Care | Payer: Self-pay | Attending: Emergency Medicine | Admitting: Emergency Medicine

## 2017-06-06 DIAGNOSIS — T1490XD Injury, unspecified, subsequent encounter: Secondary | ICD-10-CM | POA: Insufficient documentation

## 2017-06-06 DIAGNOSIS — Z7982 Long term (current) use of aspirin: Secondary | ICD-10-CM | POA: Insufficient documentation

## 2017-06-06 DIAGNOSIS — X58XXXD Exposure to other specified factors, subsequent encounter: Secondary | ICD-10-CM | POA: Insufficient documentation

## 2017-06-06 DIAGNOSIS — Z79899 Other long term (current) drug therapy: Secondary | ICD-10-CM | POA: Insufficient documentation

## 2017-06-06 DIAGNOSIS — I5032 Chronic diastolic (congestive) heart failure: Secondary | ICD-10-CM | POA: Insufficient documentation

## 2017-06-06 DIAGNOSIS — I252 Old myocardial infarction: Secondary | ICD-10-CM | POA: Insufficient documentation

## 2017-06-06 DIAGNOSIS — Z5189 Encounter for other specified aftercare: Secondary | ICD-10-CM

## 2017-06-06 DIAGNOSIS — L03012 Cellulitis of left finger: Secondary | ICD-10-CM | POA: Insufficient documentation

## 2017-06-06 DIAGNOSIS — I110 Hypertensive heart disease with heart failure: Secondary | ICD-10-CM | POA: Insufficient documentation

## 2017-06-06 DIAGNOSIS — I2510 Atherosclerotic heart disease of native coronary artery without angina pectoris: Secondary | ICD-10-CM | POA: Insufficient documentation

## 2017-06-06 DIAGNOSIS — F17220 Nicotine dependence, chewing tobacco, uncomplicated: Secondary | ICD-10-CM | POA: Insufficient documentation

## 2017-06-06 MED ORDER — SULFAMETHOXAZOLE-TRIMETHOPRIM 800-160 MG PO TABS: 1.0000 | ORAL_TABLET | Freq: Two times a day (BID) | ORAL | 0 refills | Status: AC

## 2017-06-06 MED ORDER — HYDROCODONE-ACETAMINOPHEN 5-325 MG PO TABS: 1.0000 | ORAL_TABLET | ORAL | 0 refills | Status: DC | PRN

## 2017-06-06 MED ORDER — MELOXICAM 15 MG PO TABS: 15.0000 mg | ORAL_TABLET | Freq: Every day | ORAL | 0 refills | Status: DC

## 2017-06-06 NOTE — ED Notes (Signed)
See triage note  Presents with pain to left thumb  States he had a crush injury to left thumb about 3 weeks ago. No pus or drainage noted  Nail missing

## 2017-06-06 NOTE — ED Triage Notes (Signed)
Patient presents to the ED with wound to left thumb.  Patient states injury occurred approx. 3 weeks ago and patient was seen in the ED for the injury.  Patient states, "It just doesn't seem like it's healing up like it should."  Thumb is missing the nail and has dark red to the tip.  No obvious signs of infection.  Patient denies any puss or fever.  Patient is in no obvious distress at this time.

## 2017-06-06 NOTE — ED Provider Notes (Signed)
[facility] Emergency Department Provider Note  ____________________________________________  Time seen: Approximately 4:05 PM  I have reviewed the triage vital signs and the nursing notes.   HISTORY  Chief Complaint Finger Injury    HPI Brian Porter is a 51 y.o. male who presents emergency department for wound recheck wound to the left thumb.  Patient was seen in this department approximately 3 weeks ago after suffering and open distal tuft fracture.  Patient reports that he did not follow-up with hand surgery as instructed.  Patient completed his antibiotics as instructed and states that area appeared to be healing well.  Patient accidentally struck his hand on something, dislodged part of the scabbing.  Patient reports that under this area that was reopened, he has had increasing pain, some pustular drainage.  Patient reports good range of motion to the digit.  No loss of sensation.  No other complaints at this time.  Past Medical History:  Diagnosis Date  . CAD (coronary artery disease)   . CHF (congestive heart failure) (HCC)   . GERD (gastroesophageal reflux disease)   . Hypertension   . MI, old   . Sleep apnea     Patient Active Problem List   Diagnosis Date Noted  . Bilateral recurrent inguinal hernia without obstruction or gangrene   . Acute on chronic systolic heart failure (HCC) 08/27/2016  . HTN (hypertension) 11/02/2015  . Tobacco abuse 11/02/2015  . Hand joint pain 11/02/2015  . CHF (congestive heart failure) (HCC) 10/15/2015  . Chest pain 02/22/2015  . Chest pain at rest 02/22/2015    Past Surgical History:  Procedure Laterality Date  . CARDIAC CATHETERIZATION Right 10/16/2015   Procedure: Left Heart Cath and Coronary Angiography;  Surgeon: Laurier NancyShaukat A Khan, MD;  Location: [facility] INVASIVE CV LAB;  Service: Cardiovascular;  Laterality: Right;  . CORONARY ANGIOPLASTY WITH STENT PLACEMENT  approx 2 years ago  . HERNIA REPAIR Left 11/29/2016    [facility]  . PARTIAL NEPHRECTOMY Left   . SPLENECTOMY      Prior to Admission medications   Medication Sig Start Date End Date Taking? Authorizing Provider  aspirin 81 MG EC tablet Take 1 tablet (81 mg total) by mouth daily. 08/29/16   Houston SirenSainani, Vivek J, MD  furosemide (LASIX) 40 MG tablet Take 1 tablet (40 mg total) by mouth daily. 01/13/17 01/13/18  Delma FreezeHackney, Tina A, FNP  HYDROcodone-acetaminophen (NORCO/VICODIN) 5-325 MG tablet Take 1 tablet by mouth every 4 (four) hours as needed for moderate pain. 06/06/17   Cuthriell, Delorise RoyalsJonathan D, PA-C  meloxicam (MOBIC) 15 MG tablet Take 1 tablet (15 mg total) by mouth daily. 06/06/17   Cuthriell, Delorise RoyalsJonathan D, PA-C  metoprolol succinate (TOPROL XL) 25 MG 24 hr tablet Take 1 tablet (25 mg total) by mouth daily. 01/13/17   Delma FreezeHackney, Tina A, FNP  oxyCODONE-acetaminophen (ROXICET) 5-325 MG tablet Take 1 tablet every 6 (six) hours as needed by mouth. 05/16/17 05/16/18  Joni ReiningSmith, Ronald K, PA-C  potassium chloride (K-DUR) 10 MEQ tablet Take 1 tablet (10 mEq total) by mouth daily. 01/13/17   Delma FreezeHackney, Tina A, FNP  ranitidine (ZANTAC) 150 MG tablet Take 150 mg by mouth 2 (two) times daily as needed for heartburn.    [provider]  spironolactone (ALDACTONE) 25 MG tablet Take 1 tablet (25 mg total) by mouth daily. 01/13/17   Delma FreezeHackney, Tina A, FNP  sulfamethoxazole-trimethoprim (BACTRIM DS,SEPTRA DS) 800-160 MG tablet Take 1 tablet by mouth 2 (two) times daily for 10 days. 06/06/17  06/16/17  Cuthriell, Delorise Royals, PA-C    Allergies Patient has no known allergies.  Family History  Problem Relation Age of Onset  . Heart failure Mother   . Hypertension Mother   . Diabetes Mellitus II Brother     Social History Social History   Tobacco Use  . Smoking status: Current Every Day Smoker    Packs/day: 1.00    Types: Cigarettes  . Smokeless tobacco: Never Used  Substance Use Topics  . Alcohol use: No    Alcohol/week: 0.0 oz  . Drug use: Yes    Comment: percocet      Review of Systems  Constitutional: No fever/chills Eyes: No visual changes. No discharge ENT: No upper respiratory complaints. Cardiovascular: no chest pain. Respiratory: no cough. No SOB. Musculoskeletal: Negative for musculoskeletal pain. Skin: Possible infection to previous injury site. Neurological: Negative for headaches, focal weakness or numbness. 10-point ROS otherwise negative.  ____________________________________________   PHYSICAL EXAM:  VITAL SIGNS: ED Triage Vitals  Enc Vitals Group     BP 06/06/17 1539 117/81     Pulse Rate 06/06/17 1539 93     Resp 06/06/17 1539 18     Temp 06/06/17 1539 98.2 F (36.8 C)     Temp Source 06/06/17 1539 Oral     SpO2 06/06/17 1539 94 %     Weight 06/06/17 1538 190 lb (86.2 kg)     Height 06/06/17 1538 5\' 10"  (1.778 m)     Head Circumference --      Peak Flow --      Pain Score 06/06/17 1537 5     Pain Loc --      Pain Edu? --      Excl. in GC? --      Constitutional: Alert and oriented. Well appearing and in no acute distress. Eyes: Conjunctivae are normal. PERRL. EOMI. Head: Atraumatic. Neck: No stridor.    Cardiovascular: Normal rate, regular rhythm. Normal S1 and S2.  Good peripheral circulation. Respiratory: Normal respiratory effort without tachypnea or retractions. Lungs CTAB. Good air entry to the bases with no decreased or absent breath sounds. Musculoskeletal: Full range of motion to all extremities. No gross deformities appreciated.  Visualization of the left thumb reveals scabbing over the distal aspect of the digit.  This is consistent with previously described injury from previous visit.  Area pillars to be healing well.  Just distal to nailbed, area with different appearing scab consistent with loosening and then re-healing there is some pustular drainage that is dried noted to scabbing.  No drainage at this time.  This particular area is tender to palpation with no other tenderness to palpation over the  thumb.  Palpation does not express any pustular drainage.  No sausage digit.  Capillary refill and sensation intact to the digit. Neurologic:  Normal speech and language. No gross focal neurologic deficits are appreciated.  Skin:  Skin is warm, dry and intact. No rash noted. Psychiatric: Mood and affect are normal. Speech and behavior are normal. Patient exhibits appropriate insight and judgement.   ____________________________________________   LABS (all labs ordered are listed, but only abnormal results are displayed)  Labs Reviewed - No data to display ____________________________________________  EKG   ____________________________________________  RADIOLOGY   No results found.  ____________________________________________    PROCEDURES  Procedure(s) performed:    Procedures    Medications - No data to display   ____________________________________________   INITIAL IMPRESSION / ASSESSMENT AND PLAN / ED COURSE  Pertinent  labs & imaging results that were available during my care of the patient were reviewed by me and considered in my medical decision making (see chart for details).  Review of the [place] CSRS was performed in accordance of the [facility] prior to dispensing any controlled drugs.     Patient's diagnosis is consistent with healing wound, wound recheck, cellulitis to the digit.  Patient has one area that has dislodged scabbing, with mild infection underlying.  No indication of deeper abscess.  No indication at this time for labs or imaging.  Initial differential included reinjury of the site, cellulitis, abscess, infectious tenosynovitis.  Exam is reassuring and patient will be discharged with oral antibiotics, limited pain medication, anti-inflammatories for symptom control..  She is instructed to follow-up with hand surgery at this time.  Patient is given ED precautions to return to the ED for any worsening or new  symptoms.     ____________________________________________  FINAL CLINICAL IMPRESSION(S) / ED DIAGNOSES  Final diagnoses:  Healing wound  Visit for wound check  Cellulitis of finger of left hand      NEW MEDICATIONS STARTED DURING THIS VISIT:  ED Discharge Orders        Ordered    sulfamethoxazole-trimethoprim (BACTRIM DS,SEPTRA DS) 800-160 MG tablet  2 times daily     06/06/17 1607    HYDROcodone-acetaminophen (NORCO/VICODIN) 5-325 MG tablet  Every 4 hours PRN     06/06/17 1607    meloxicam (MOBIC) 15 MG tablet  Daily     06/06/17 1607          This chart was dictated using voice recognition software/Dragon. Despite best efforts to proofread, errors can occur which can change the meaning. Any change was purely unintentional.    Racheal PatchesCuthriell, Jonathan D, PA-C 06/06/17 1614    Sharman CheekStafford, Phillip, MD 06/09/17 [phone]

## 2017-06-13 ENCOUNTER — Encounter: Payer: Self-pay | Admitting: Emergency Medicine

## 2017-06-13 DIAGNOSIS — I110 Hypertensive heart disease with heart failure: Secondary | ICD-10-CM | POA: Insufficient documentation

## 2017-06-13 DIAGNOSIS — F13129 Sedative, hypnotic or anxiolytic abuse with intoxication, unspecified: Secondary | ICD-10-CM | POA: Insufficient documentation

## 2017-06-13 DIAGNOSIS — I2510 Atherosclerotic heart disease of native coronary artery without angina pectoris: Secondary | ICD-10-CM | POA: Insufficient documentation

## 2017-06-13 DIAGNOSIS — Z7982 Long term (current) use of aspirin: Secondary | ICD-10-CM | POA: Insufficient documentation

## 2017-06-13 DIAGNOSIS — F17210 Nicotine dependence, cigarettes, uncomplicated: Secondary | ICD-10-CM | POA: Insufficient documentation

## 2017-06-13 DIAGNOSIS — I252 Old myocardial infarction: Secondary | ICD-10-CM | POA: Insufficient documentation

## 2017-06-13 DIAGNOSIS — Z791 Long term (current) use of non-steroidal anti-inflammatories (NSAID): Secondary | ICD-10-CM | POA: Insufficient documentation

## 2017-06-13 DIAGNOSIS — Z79899 Other long term (current) drug therapy: Secondary | ICD-10-CM | POA: Insufficient documentation

## 2017-06-13 DIAGNOSIS — I5022 Chronic systolic (congestive) heart failure: Secondary | ICD-10-CM | POA: Insufficient documentation

## 2017-06-13 LAB — URINE DRUG SCREEN, QUALITATIVE (ARMC ONLY)
AMPHETAMINES, UR SCREEN: NOT DETECTED
Barbiturates, Ur Screen: NOT DETECTED
Benzodiazepine, Ur Scrn: NOT DETECTED
COCAINE METABOLITE, UR ~~LOC~~: NOT DETECTED
Cannabinoid 50 Ng, Ur ~~LOC~~: NOT DETECTED
MDMA (Ecstasy)Ur Screen: NOT DETECTED
METHADONE SCREEN, URINE: NOT DETECTED
OPIATE, UR SCREEN: NOT DETECTED
Phencyclidine (PCP) Ur S: NOT DETECTED
TRICYCLIC, UR SCREEN: NOT DETECTED

## 2017-06-13 LAB — CBC
HEMATOCRIT: 48.1 % (ref 40.0–52.0)
HEMOGLOBIN: 16.3 g/dL (ref 13.0–18.0)
MCH: 32.1 pg (ref 26.0–34.0)
MCHC: 34 g/dL (ref 32.0–36.0)
MCV: 94.5 fL (ref 80.0–100.0)
Platelets: 314 10*3/uL (ref 150–440)
RBC: 5.09 MIL/uL (ref 4.40–5.90)
RDW: 13.3 % (ref 11.5–14.5)
WBC: 11.7 10*3/uL — AB (ref 3.8–10.6)

## 2017-06-13 LAB — COMPREHENSIVE METABOLIC PANEL
ALBUMIN: 4.1 g/dL (ref 3.5–5.0)
ALT: 16 U/L — ABNORMAL LOW (ref 17–63)
ANION GAP: 10 (ref 5–15)
AST: 18 U/L (ref 15–41)
Alkaline Phosphatase: 53 U/L (ref 38–126)
BILIRUBIN TOTAL: 0.7 mg/dL (ref 0.3–1.2)
BUN: 28 mg/dL — ABNORMAL HIGH (ref 6–20)
CALCIUM: 8.9 mg/dL (ref 8.9–10.3)
CO2: 24 mmol/L (ref 22–32)
Chloride: 104 mmol/L (ref 101–111)
Creatinine, Ser: 1.01 mg/dL (ref 0.61–1.24)
Glucose, Bld: 122 mg/dL — ABNORMAL HIGH (ref 65–99)
POTASSIUM: 3.9 mmol/L (ref 3.5–5.1)
Sodium: 138 mmol/L (ref 135–145)
TOTAL PROTEIN: 7.4 g/dL (ref 6.5–8.1)

## 2017-06-13 LAB — ETHANOL

## 2017-06-13 LAB — SALICYLATE LEVEL

## 2017-06-13 LAB — ACETAMINOPHEN LEVEL: Acetaminophen (Tylenol), Serum: <10 ug/mL — ABNORMAL LOW (ref 10–30)

## 2017-06-13 NOTE — ED Notes (Signed)
Pt reports he stopped taking baclofen for several months. Stopped taking this morning and thinks he may be having withdrawals from then due to shaking and feeling sick on his stomach.

## 2017-06-13 NOTE — ED Triage Notes (Addendum)
Pt reports he stopped taking baclofen 5mg  this AM and feels he is having withdrawal symptoms, upset stomach. Pt sts he was taking 3-4 per day and pt ran out this AM. Pt sts he received them from a friend for his wound on the left thumb, Pt has been seen in ED on 12/07 for thumb wound. Pt denies drug and ETOH. Pt denies SI and HI.

## 2017-06-14 ENCOUNTER — Emergency Department
Admission: EM | Admit: 2017-06-14 | Discharge: 2017-06-14 | Disposition: A | Discharge status: Home / Self Care | Payer: Self-pay | Attending: Emergency Medicine | Admitting: Emergency Medicine

## 2017-06-14 DIAGNOSIS — F13939 Sedative, hypnotic or anxiolytic use, unspecified with withdrawal, unspecified: Secondary | ICD-10-CM

## 2017-06-14 DIAGNOSIS — F13239 Sedative, hypnotic or anxiolytic dependence with withdrawal, unspecified: Secondary | ICD-10-CM

## 2017-06-14 MED ORDER — CHLORDIAZEPOXIDE HCL 25 MG PO CAPS
25.0000 mg | ORAL_CAPSULE | Freq: Once | ORAL | Status: AC
  Administered 2017-06-14: 25 mg via ORAL
  Filled 2017-06-14: qty 1

## 2017-06-14 NOTE — ED Notes (Signed)
Reviewed discharge instructions and follow-up care with patient. Patient verbalized understanding of all information reviewed. Patient stable, with no acute distress noted at this time.

## 2017-06-14 NOTE — Discharge Instructions (Signed)
PLEASE NEVER TAKE MEDICATIONS YOU BUY FROM A FRIEND - IT'S EXTREMELY DANGEROUS!!  Please make an appointment to establish care with primary care physician this coming Monday for recheck and return to the emergency department for any concerns.  Will probably take another 2-3 days to get the baclofen completely out of your system.  It was a pleasure to take care of you today, and thank you for coming to our emergency department.  If you have any questions or concerns before leaving please ask the nurse to grab me and I'm more than happy to go through your aftercare instructions again.  If you were prescribed any opioid pain medication today such as Norco, Vicodin, Percocet, morphine, hydrocodone, or oxycodone please make sure you do not drive when you are taking this medication as it can alter your ability to drive safely.  If you have any concerns once you are home that you are not improving or are in fact getting worse before you can make it to your follow-up appointment, please do not hesitate to call [phone] and come back for further evaluation.  Merrily BrittleNeil Creedence Heiss, MD

## 2017-06-14 NOTE — ED Provider Notes (Signed)
[facility] Emergency Department Provider Note  ____________________________________________   First MD Initiated Contact with Patient 06/14/17 0016     (approximate)  I have reviewed the triage vital signs and the nursing notes.   HISTORY  Chief Complaint Withdrawal    HPI Brian Porter is a 51 y.o. male who self presents to the emergency department with malaise tremors and anxiety that began this morning.  For the past 3 or 4 months he has been taking baclofen 3-4 times a day 5 mg tablets that he has purchased from a friend and has never been prescribed.  He takes it for chronic for muscle cramps.  This morning he took the last dose and decided that he wanted to get off the medications.  Ever since missing his doses he is become gradually more anxious and has an uncomfortable feeling.  His symptoms had insidious onset are moderate severity.  They are worsened by not having baclofen and improved when he does take them.  Past Medical History:  Diagnosis Date  . CAD (coronary artery disease)   . CHF (congestive heart failure) (HCC)   . GERD (gastroesophageal reflux disease)   . Hypertension   . MI, old   . Sleep apnea     Patient Active Problem List   Diagnosis Date Noted  . Bilateral recurrent inguinal hernia without obstruction or gangrene   . Acute on chronic systolic heart failure (HCC) 08/27/2016  . HTN (hypertension) 11/02/2015  . Tobacco abuse 11/02/2015  . Hand joint pain 11/02/2015  . CHF (congestive heart failure) (HCC) 10/15/2015  . Chest pain 02/22/2015  . Chest pain at rest 02/22/2015    Past Surgical History:  Procedure Laterality Date  . CARDIAC CATHETERIZATION Right 10/16/2015   Procedure: Left Heart Cath and Coronary Angiography;  Surgeon: Laurier NancyShaukat A Khan, MD;  Location: [facility] INVASIVE CV LAB;  Service: Cardiovascular;  Laterality: Right;  . CORONARY ANGIOPLASTY WITH STENT PLACEMENT  approx 2 years ago  . HERNIA REPAIR Left  11/29/2016   [facility]  . PARTIAL NEPHRECTOMY Left   . SPLENECTOMY      Prior to Admission medications   Medication Sig Start Date End Date Taking? Authorizing Provider  aspirin 81 MG EC tablet Take 1 tablet (81 mg total) by mouth daily. 08/29/16   [place] SirenSainani, Vivek J, MD  furosemide (LASIX) 40 MG tablet Take 1 tablet (40 mg total) by mouth daily. 01/13/17 01/13/18  Delma FreezeHackney, Tina A, FNP  HYDROcodone-acetaminophen (NORCO/VICODIN) 5-325 MG tablet Take 1 tablet by mouth every 4 (four) hours as needed for moderate pain. 06/06/17   Cuthriell, Delorise RoyalsJonathan D, PA-C  meloxicam (MOBIC) 15 MG tablet Take 1 tablet (15 mg total) by mouth daily. 06/06/17   Cuthriell, Delorise RoyalsJonathan D, PA-C  metoprolol succinate (TOPROL XL) 25 MG 24 hr tablet Take 1 tablet (25 mg total) by mouth daily. 01/13/17   Delma FreezeHackney, Tina A, FNP  oxyCODONE-acetaminophen (ROXICET) 5-325 MG tablet Take 1 tablet every 6 (six) hours as needed by mouth. 05/16/17 05/16/18  Joni ReiningSmith, Ronald K, PA-C  potassium chloride (K-DUR) 10 MEQ tablet Take 1 tablet (10 mEq total) by mouth daily. 01/13/17   Delma FreezeHackney, Tina A, FNP  ranitidine (ZANTAC) 150 MG tablet Take 150 mg by mouth 2 (two) times daily as needed for heartburn.    [provider]  spironolactone (ALDACTONE) 25 MG tablet Take 1 tablet (25 mg total) by mouth daily. 01/13/17   Delma FreezeHackney, Tina A, FNP  sulfamethoxazole-trimethoprim (BACTRIM DS,SEPTRA DS) 800-160 MG tablet  Take 1 tablet by mouth 2 (two) times daily for 10 days. 06/06/17 06/16/17  Cuthriell, Delorise Royals, PA-C    Allergies Patient has no known allergies.  Family History  Problem Relation Age of Onset  . Heart failure Mother   . Hypertension Mother   . Diabetes Mellitus II Brother     Social History Social History   Tobacco Use  . Smoking status: Current Every Day Smoker    Packs/day: 1.00    Types: Cigarettes  . Smokeless tobacco: Never Used  Substance Use Topics  . Alcohol use: No    Alcohol/week: 0.0 oz  . Drug use: Yes    Comment:  percocet    Review of Systems Constitutional: No fever/chills Eyes: No visual changes. ENT: No sore throat. Cardiovascular: Denies chest pain. Respiratory: Denies shortness of breath. Gastrointestinal: No abdominal pain.  Positive for nausea, no vomiting.  No diarrhea.  No constipation. Genitourinary: Negative for dysuria. Musculoskeletal: Negative for back pain. Skin: Negative for rash. Neurological: Negative for headaches, focal weakness or numbness.   ____________________________________________   PHYSICAL EXAM:  VITAL SIGNS: ED Triage Vitals [06/13/17 2328]  Enc Vitals Group     BP 120/72     Pulse Rate 82     Resp 16     Temp 97.8 F (36.6 C)     Temp Source Oral     SpO2 97 %     Weight 190 lb (86.2 kg)     Height      Head Circumference      Peak Flow      Pain Score      Pain Loc      Pain Edu?      Excl. in GC?     Constitutional: Alert and oriented x4 fidgety and somewhat anxious appearing no diaphoresis Eyes: PERRL EOMI. pupils midrange and brisk Head: Atraumatic. Nose: No congestion/rhinnorhea. Mouth/Throat: No trismus no tongue fasciculation Neck: No stridor.   Cardiovascular: Normal rate, regular rhythm. Grossly normal heart sounds.  Good peripheral circulation. Respiratory: Normal respiratory effort.  No retractions. Lungs CTAB and moving good air Gastrointestinal: Soft nontender Musculoskeletal: No lower extremity edema no hand tremors Neurologic:  Normal speech and language. No gross focal neurologic deficits are appreciated. Skin:  Skin is warm, dry and intact. No rash noted. Psychiatric: Mood and affect are normal. Speech and behavior are normal.    ____________________________________________   DIFFERENTIAL includes but not limited to  Baclofen withdrawal, benzodiazepine withdrawal, opiate withdrawal, generalized anxiety ____________________________________________   LABS (all labs ordered are listed, but only abnormal results are  displayed)  Labs Reviewed  COMPREHENSIVE METABOLIC PANEL - Abnormal; Notable for the following components:      Result Value   Glucose, Bld 122 (*)    BUN 28 (*)    ALT 16 (*)    All other components within normal limits  ACETAMINOPHEN LEVEL - Abnormal; Notable for the following components:   Acetaminophen (Tylenol), Serum <10 (*)    All other components within normal limits  CBC - Abnormal; Notable for the following components:   WBC 11.7 (*)    All other components within normal limits  ETHANOL  SALICYLATE LEVEL  URINE DRUG SCREEN, QUALITATIVE ([facility] ONLY)    Blood work reviewed by me with slightly elevated white count which is nonspecific and there is no acute disease __________________________________________  EKG   ____________________________________________  RADIOLOGY   ____________________________________________   PROCEDURES  Procedure(s) performed: no  Procedures  Critical Care performed:  no  Observation: no ____________________________________________   INITIAL IMPRESSION / ASSESSMENT AND PLAN / ED COURSE  Pertinent labs & imaging results that were available during my care of the patient were reviewed by me and considered in my medical decision making (see chart for details).  The patient is hemodynamically stable and not currently tremulous.  Had a lengthy discussion with the patient regarding the dangers of taking sedatives that he purchases from friends.  We will give him a single dose of chlordiazepoxide now to help with his symptoms but he does not require any further treatment at this point.  I have educated him on the predicted course and that he should take 2-3 more days to get this out of his system.  He understands that if he is more tremulous tomorrow or if he has a seizure he is to return to the emergency department.  Strict return precautions have been given the patient verbalizes understanding and agreement the plan.       ____________________________________________   FINAL CLINICAL IMPRESSION(S) / ED DIAGNOSES  Final diagnoses:  Sedative withdrawal (HCC)      NEW MEDICATIONS STARTED DURING THIS VISIT:  This SmartLink is deprecated. Use AVSMEDLIST instead to display the medication list for a patient.   Note:  This document was prepared using Dragon voice recognition software and may include unintentional dictation errors.     Merrily Brittleifenbark, Rocio Wolak, MD 06/14/17 [phone]

## 2017-08-17 ENCOUNTER — Emergency Department
Admission: EM | Admit: 2017-08-17 | Discharge: 2017-08-17 | Disposition: A | Discharge status: Home / Self Care | Payer: Self-pay | Attending: Emergency Medicine | Admitting: Emergency Medicine

## 2017-08-17 ENCOUNTER — Emergency Department: Payer: Self-pay

## 2017-08-17 ENCOUNTER — Encounter: Payer: Self-pay | Admitting: *Deleted

## 2017-08-17 ENCOUNTER — Other Ambulatory Visit: Payer: Self-pay

## 2017-08-17 DIAGNOSIS — I2510 Atherosclerotic heart disease of native coronary artery without angina pectoris: Secondary | ICD-10-CM | POA: Insufficient documentation

## 2017-08-17 DIAGNOSIS — I10 Essential (primary) hypertension: Secondary | ICD-10-CM | POA: Insufficient documentation

## 2017-08-17 DIAGNOSIS — I5089 Other heart failure: Secondary | ICD-10-CM | POA: Insufficient documentation

## 2017-08-17 DIAGNOSIS — Z79899 Other long term (current) drug therapy: Secondary | ICD-10-CM | POA: Insufficient documentation

## 2017-08-17 DIAGNOSIS — F17210 Nicotine dependence, cigarettes, uncomplicated: Secondary | ICD-10-CM | POA: Insufficient documentation

## 2017-08-17 DIAGNOSIS — Z7982 Long term (current) use of aspirin: Secondary | ICD-10-CM | POA: Insufficient documentation

## 2017-08-17 DIAGNOSIS — I509 Heart failure, unspecified: Secondary | ICD-10-CM

## 2017-08-17 LAB — BASIC METABOLIC PANEL
Anion gap: 9 (ref 5–15)
BUN: 13 mg/dL (ref 6–20)
CO2: 25 mmol/L (ref 22–32)
Calcium: 8.7 mg/dL — ABNORMAL LOW (ref 8.9–10.3)
Chloride: 105 mmol/L (ref 101–111)
Creatinine, Ser: 0.86 mg/dL (ref 0.61–1.24)
GFR calc non Af Amer: >60 mL/min (ref >60)
Glucose, Bld: 100 mg/dL — ABNORMAL HIGH (ref 65–99)
POTASSIUM: 4.4 mmol/L (ref 3.5–5.1)
SODIUM: 139 mmol/L (ref 135–145)

## 2017-08-17 LAB — ETHANOL

## 2017-08-17 LAB — CBC
HEMATOCRIT: 43.6 % (ref 40.0–52.0)
Hemoglobin: 14.6 g/dL (ref 13.0–18.0)
MCH: 32 pg (ref 26.0–34.0)
MCHC: 33.4 g/dL (ref 32.0–36.0)
MCV: 95.8 fL (ref 80.0–100.0)
PLATELETS: 345 10*3/uL (ref 150–440)
RBC: 4.55 MIL/uL (ref 4.40–5.90)
RDW: 13.9 % (ref 11.5–14.5)
WBC: 16.3 10*3/uL — AB (ref 3.8–10.6)

## 2017-08-17 LAB — URINE DRUG SCREEN, QUALITATIVE (ARMC ONLY)
AMPHETAMINES, UR SCREEN: NOT DETECTED
BENZODIAZEPINE, UR SCRN: NOT DETECTED
Barbiturates, Ur Screen: NOT DETECTED
CANNABINOID 50 NG, UR ~~LOC~~: NOT DETECTED
Cocaine Metabolite,Ur ~~LOC~~: NOT DETECTED
MDMA (ECSTASY) UR SCREEN: NOT DETECTED
Methadone Scn, Ur: NOT DETECTED
Opiate, Ur Screen: NOT DETECTED
Phencyclidine (PCP) Ur S: NOT DETECTED
TRICYCLIC, UR SCREEN: NOT DETECTED

## 2017-08-17 LAB — BRAIN NATRIURETIC PEPTIDE: B NATRIURETIC PEPTIDE 5: 321 pg/mL — AB (ref 0.0–100.0)

## 2017-08-17 LAB — TROPONIN I: Troponin I: <0.03 ng/mL (ref <0.03)

## 2017-08-17 IMAGING — CR DG CHEST 2V
1 series · 2 of 2 positions shown · non-contrast
Comparison: [DATE]

CLINICAL DATA: Shortness of breath and congestion.

EXAM:
CHEST  2 VIEW

[Series 1: dg chest 2 view · 0.14mm/px · 2 of 2 slices shown]
[im 1/2]
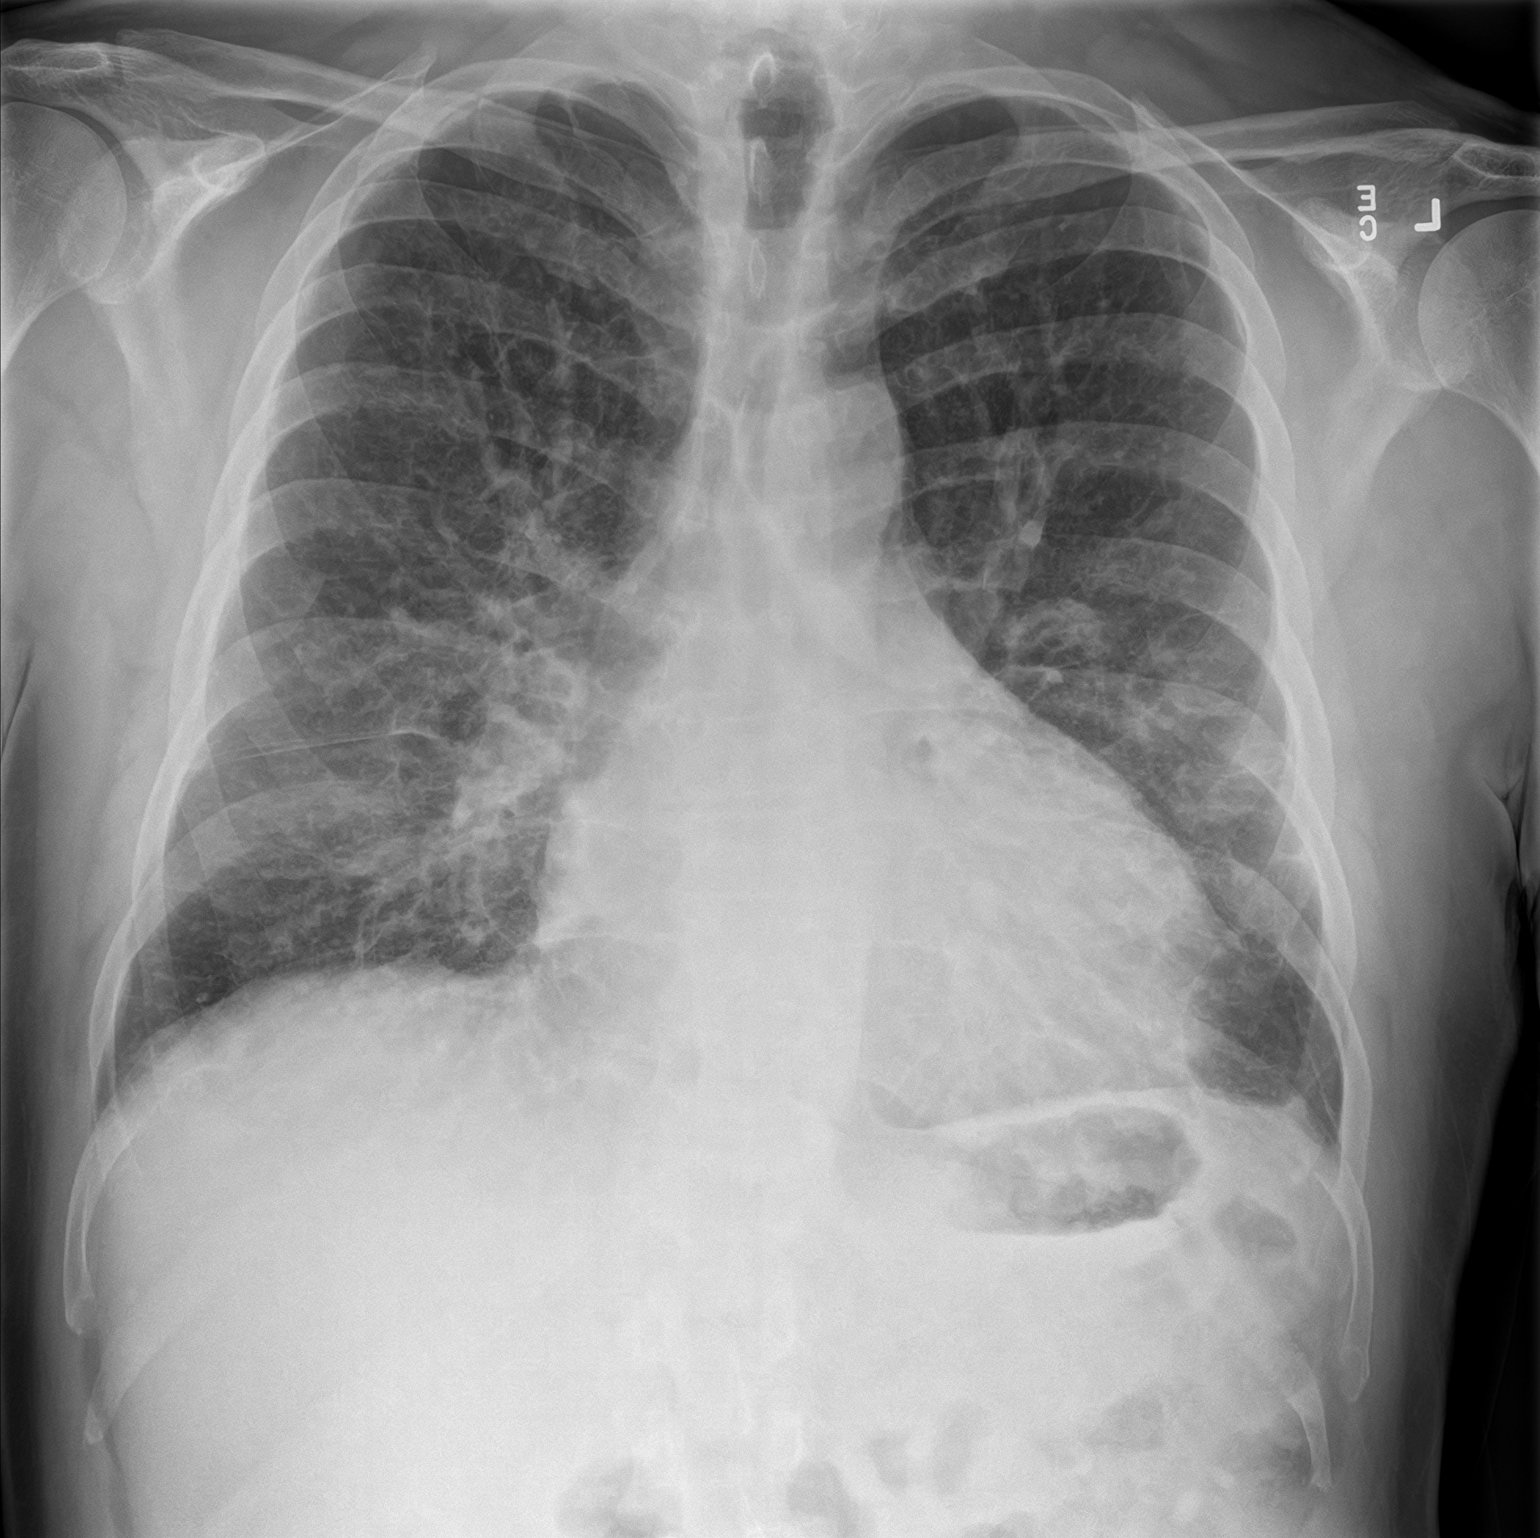
[im 2/2]
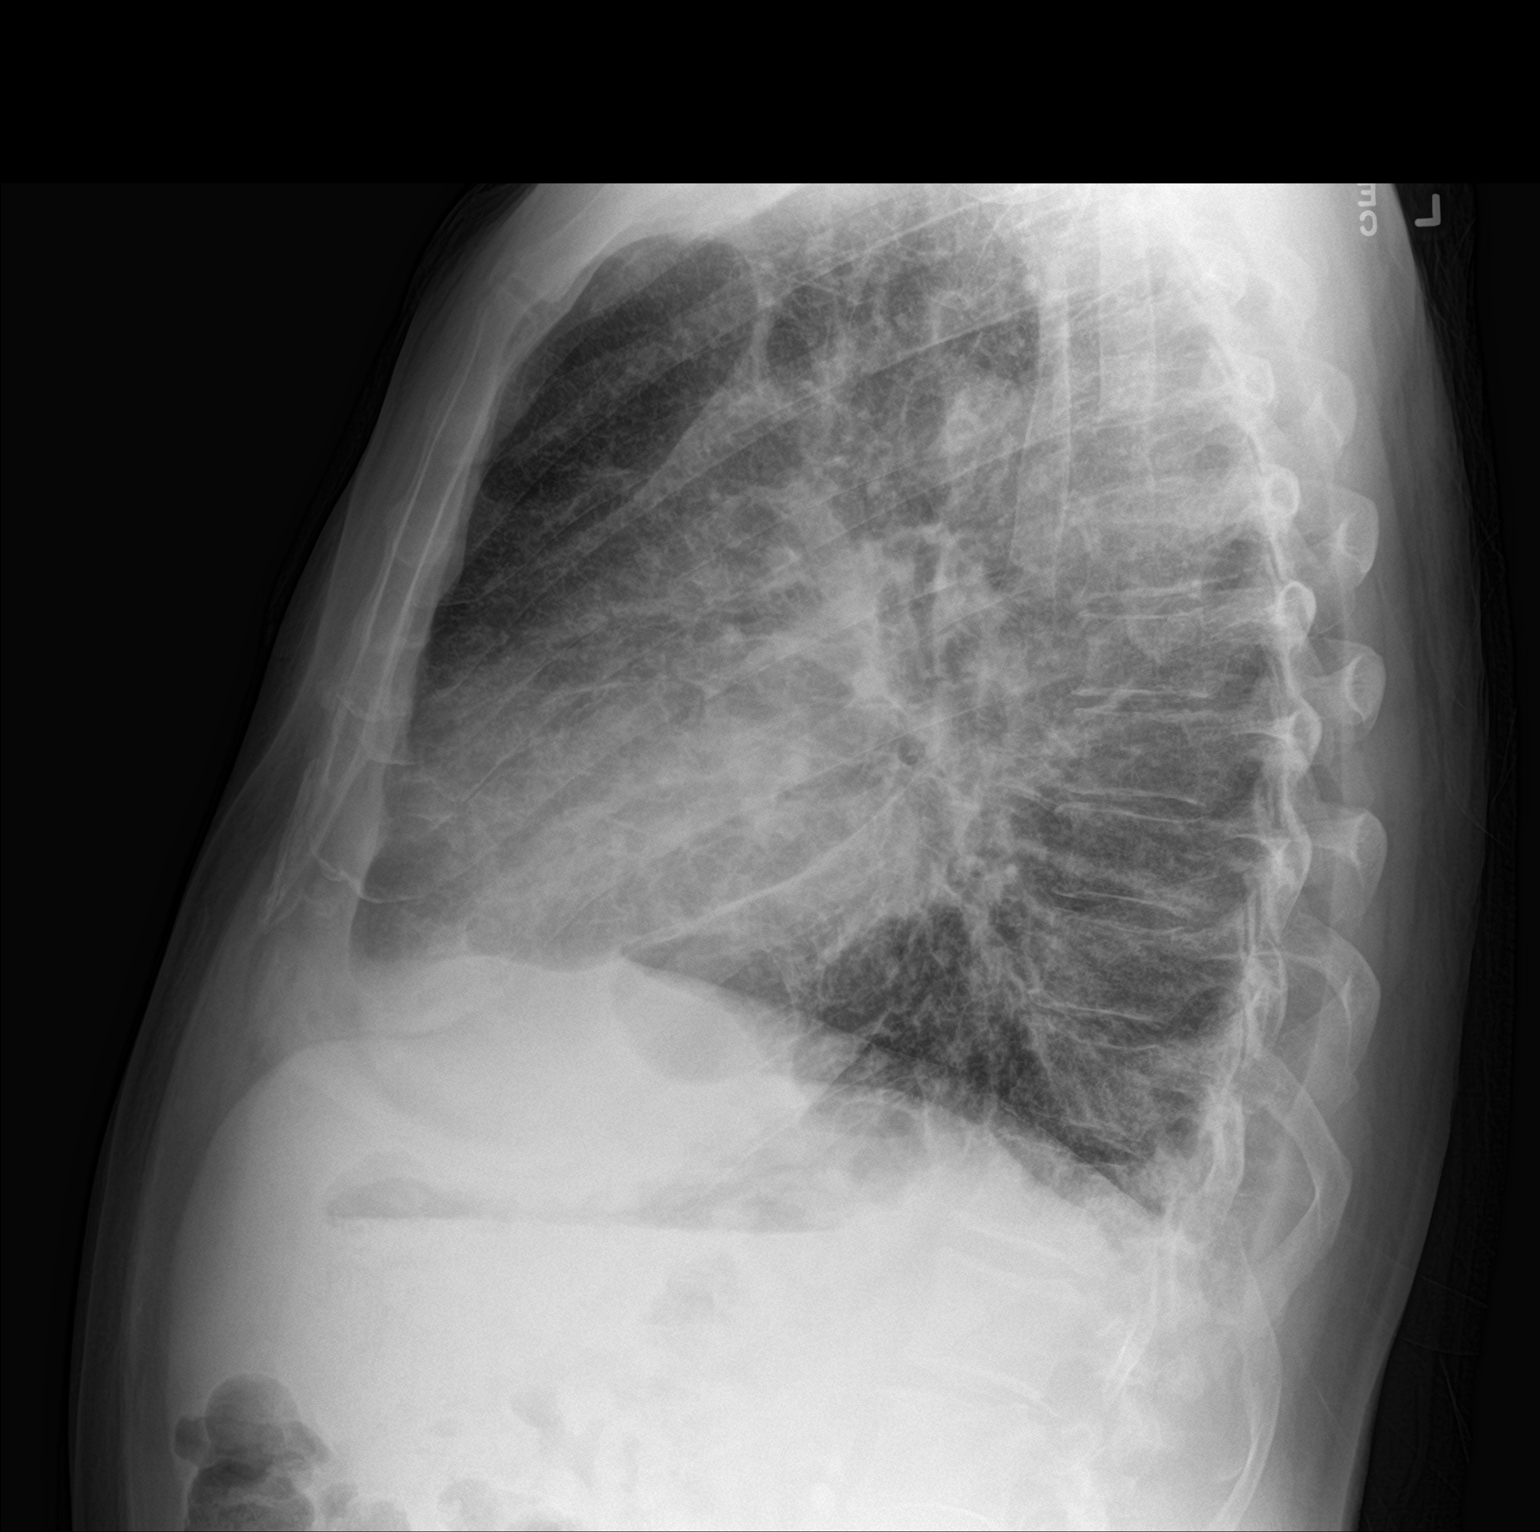

[2 of 2 positions shown; findings below may reference images not displayed]

FINDINGS: Mild cardiomegaly is similar to prior exam. There is vascular
congestion and mild central pulmonary edema. Trace pleural effusions
and fluid in the fissures. No confluent airspace disease. No
pneumothorax. No acute osseous abnormalities are seen.
IMPRESSION: Mild CHF with pulmonary edema and small pleural effusions.

## 2017-08-17 MED ORDER — IBUPROFEN 600 MG PO TABS
600.0000 mg | ORAL_TABLET | Freq: Once | ORAL | Status: AC
  Administered 2017-08-17: 600 mg via ORAL
  Filled 2017-08-17: qty 1

## 2017-08-17 MED ORDER — FUROSEMIDE 40 MG PO TABS: 20.0000 mg | ORAL_TABLET | Freq: Once | ORAL | Status: DC

## 2017-08-17 MED ORDER — FUROSEMIDE 40 MG PO TABS
40.0000 mg | ORAL_TABLET | Freq: Once | ORAL | Status: AC
  Administered 2017-08-17: 40 mg via ORAL
  Filled 2017-08-17: qty 1

## 2017-08-17 NOTE — ED Provider Notes (Signed)
[facility] Emergency Department Provider Note  ____________________________________________   First MD Initiated Contact with Patient 08/17/17 2253     (approximate)  I have reviewed the triage vital signs and the nursing notes.   HISTORY  Chief Complaint Shortness of Breath and Chest Pain   HPI Brian Porter is a 52 y.o. male comes to the emergency department with several weeks of insidious onset slowly progressive daily now mild to moderate shortness of breath.  He has a past medical history of congestive heart failure and reports compliance with his once a day Lasix.  He denies chest pain.  His symptoms seem to be somewhat worse at night and somewhat improved during the day.  He sleeps on 2 pillows and does not wake at night short of breath.  Denies leg swelling.  He denies change in weight.  Past Medical History:  Diagnosis Date  . CAD (coronary artery disease)   . CHF (congestive heart failure) (HCC)   . GERD (gastroesophageal reflux disease)   . Hypertension   . MI, old   . Sleep apnea     Patient Active Problem List   Diagnosis Date Noted  . Bilateral recurrent inguinal hernia without obstruction or gangrene   . Acute on chronic systolic heart failure (HCC) 08/27/2016  . HTN (hypertension) 11/02/2015  . Tobacco abuse 11/02/2015  . Hand joint pain 11/02/2015  . CHF (congestive heart failure) (HCC) 10/15/2015  . Chest pain 02/22/2015  . Chest pain at rest 02/22/2015    Past Surgical History:  Procedure Laterality Date  . CARDIAC CATHETERIZATION Right 10/16/2015   Procedure: Left Heart Cath and Coronary Angiography;  Surgeon: Laurier Nancy, MD;  Location: [facility] INVASIVE CV LAB;  Service: Cardiovascular;  Laterality: Right;  . CORONARY ANGIOPLASTY WITH STENT PLACEMENT  approx 2 years ago  . HERNIA REPAIR Left 11/29/2016   [facility]  . PARTIAL NEPHRECTOMY Left   . SPLENECTOMY      Prior to Admission medications   Medication Sig Start Date  End Date Taking? Authorizing Provider  aspirin 81 MG EC tablet Take 1 tablet (81 mg total) by mouth daily. 08/29/16   Houston Siren, MD  furosemide (LASIX) 40 MG tablet Take 1 tablet (40 mg total) by mouth daily. 01/13/17 01/13/18  Delma Freeze, FNP  HYDROcodone-acetaminophen (NORCO/VICODIN) 5-325 MG tablet Take 1 tablet by mouth every 4 (four) hours as needed for moderate pain. 06/06/17   Cuthriell, Delorise Royals, PA-C  meloxicam (MOBIC) 15 MG tablet Take 1 tablet (15 mg total) by mouth daily. 06/06/17   Cuthriell, Delorise Royals, PA-C  metoprolol succinate (TOPROL XL) 25 MG 24 hr tablet Take 1 tablet (25 mg total) by mouth daily. 01/13/17   Delma Freeze, FNP  oxyCODONE-acetaminophen (ROXICET) 5-325 MG tablet Take 1 tablet every 6 (six) hours as needed by mouth. 05/16/17 05/16/18  Joni Reining, PA-C  potassium chloride (K-DUR) 10 MEQ tablet Take 1 tablet (10 mEq total) by mouth daily. 01/13/17   Delma Freeze, FNP  ranitidine (ZANTAC) 150 MG tablet Take 150 mg by mouth 2 (two) times daily as needed for heartburn.    [provider]  spironolactone (ALDACTONE) 25 MG tablet Take 1 tablet (25 mg total) by mouth daily. 01/13/17   Delma Freeze, FNP    Allergies Patient has no known allergies.  Family History  Problem Relation Age of Onset  . Heart failure Mother   . Hypertension Mother   . Diabetes Mellitus  II Brother     Social History Social History   Tobacco Use  . Smoking status: Current Every Day Smoker    Packs/day: 1.00    Types: Cigarettes  . Smokeless tobacco: Never Used  Substance Use Topics  . Alcohol use: No    Alcohol/week: 0.0 oz  . Drug use: Yes    Comment: percocet    Review of Systems Constitutional: No fever/chills Eyes: No visual changes. ENT: No sore throat. Cardiovascular: Denies chest pain. Respiratory: Positive for shortness of breath. Gastrointestinal: No abdominal pain.  No nausea, no vomiting.  No diarrhea.  No constipation. Genitourinary:  Negative for dysuria. Musculoskeletal: Negative for back pain. Skin: Negative for rash. Neurological: Negative for headaches, focal weakness or numbness.   ____________________________________________   PHYSICAL EXAM:  VITAL SIGNS: ED Triage Vitals  Enc Vitals Group     BP 08/17/17 1838 122/84     Pulse Rate 08/17/17 1838 (!) 101     Resp 08/17/17 1838 (!) 22     Temp 08/17/17 1838 97.9 F (36.6 C)     Temp Source 08/17/17 1838 Oral     SpO2 08/17/17 1838 99 %     Weight 08/17/17 1839 190 lb (86.2 kg)     Height 08/17/17 1839 5\' 10"  (1.778 m)     Head Circumference --      Peak Flow --      Pain Score 08/17/17 1838 6     Pain Loc --      Pain Edu? --      Excl. in GC? --     Constitutional: Alert and oriented x4 pleasant cooperative speaks in full clear sentences no diaphoresis Eyes: PERRL EOMI. Head: Atraumatic. Nose: No congestion/rhinnorhea. Mouth/Throat: No trismus Neck: No stridor.   Able to lie completely flat with mild JVD Cardiovascular: Normal rate, regular rhythm. Grossly normal heart sounds.  Good peripheral circulation. Respiratory: Slightly increased respiratory effort crackles at bilateral bases Gastrointestinal: Soft nontender Musculoskeletal: No lower extremity edema   Neurologic:  Normal speech and language. No gross focal neurologic deficits are appreciated. Skin:  Skin is warm, dry and intact. No rash noted. Psychiatric: Mood and affect are normal. Speech and behavior are normal.    ____________________________________________   DIFFERENTIAL includes but not limited to  Influenza, pneumonia, pneumothorax, pulmonary embolism, congestive heart failure ____________________________________________   LABS (all labs ordered are listed, but only abnormal results are displayed)  Labs Reviewed  BASIC METABOLIC PANEL - Abnormal; Notable for the following components:      Result Value   Glucose, Bld 100 (*)    Calcium 8.7 (*)    All other  components within normal limits  CBC - Abnormal; Notable for the following components:   WBC 16.3 (*)    All other components within normal limits  BRAIN NATRIURETIC PEPTIDE - Abnormal; Notable for the following components:   B Natriuretic Peptide 321.0 (*)    All other components within normal limits  TROPONIN I  ETHANOL  URINE DRUG SCREEN, QUALITATIVE ([facility] ONLY)    Lab work reviewed by me was slightly elevated BNP concerning for fluid overload __________________________________________  EKG  ED ECG REPORT I, Merrily Brittle, the attending physician, personally viewed and interpreted this ECG.  Date: 08/17/2017 EKG Time:  Rate: 93 Rhythm: normal sinus rhythm QRS Axis: Leftward axis Intervals: normal ST/T Wave abnormalities: normal Narrative Interpretation: no evidence of acute ischemia  ____________________________________________  RADIOLOGY  Chest x-ray reviewed by me consistent with mild CHF and  pulmonary edema ____________________________________________   PROCEDURES  Procedure(s) performed: no  Procedures  Critical Care performed: no  Observation: no ____________________________________________   INITIAL IMPRESSION / ASSESSMENT AND PLAN / ED COURSE  Pertinent labs & imaging results that were available during my care of the patient were reviewed by me and considered in my medical decision making (see chart for details).  The patient has crackles at bilateral bases along with some JVD and a chest x-ray concerning for mild pulmonary edema.  He is only taking his Lasix once a day.  His exam is most consistent with acute pulmonary edema.  I will give him an extra dose now and refer him to CHF clinic as an outpatient.  The patient verbalizes understanding and agreement with the plan with strict return precautions.      ____________________________________________   FINAL CLINICAL IMPRESSION(S) / ED DIAGNOSES  Final diagnoses:  Acute on chronic  congestive heart failure, unspecified heart failure type (HCC)      NEW MEDICATIONS STARTED DURING THIS VISIT:  Discharge Medication List as of 08/17/2017 11:02 PM       Note:  This document was prepared using Dragon voice recognition software and may include unintentional dictation errors.     Merrily Brittleifenbark, Mitzie Marlar, MD 08/19/17 [phone]

## 2017-08-17 NOTE — Discharge Instructions (Addendum)
Please continue taking all of your medications as prescribed and follow-up in the heart failure clinic tomorrow for reevaluation.  Return to the emergency department sooner for any concerns whatsoever.  It was a pleasure to take care of you today, and thank you for coming to our emergency department.  If you have any questions or concerns before leaving please ask the nurse to grab me and I'm more than happy to go through your aftercare instructions again.  If you were prescribed any opioid pain medication today such as Norco, Vicodin, Percocet, morphine, hydrocodone, or oxycodone please make sure you do not drive when you are taking this medication as it can alter your ability to drive safely.  If you have any concerns once you are home that you are not improving or are in fact getting worse before you can make it to your follow-up appointment, please do not hesitate to call 911 and come back for further evaluation.  Merrily Brittle, MD  Results for orders placed or performed during the hospital encounter of 08/17/17  Basic metabolic panel  Result Value Ref Range   Sodium 139 135 - 145 mmol/L   Potassium 4.4 3.5 - 5.1 mmol/L   Chloride 105 101 - 111 mmol/L   CO2 25 22 - 32 mmol/L   Glucose, Bld 100 (H) 65 - 99 mg/dL   BUN 13 6 - 20 mg/dL   Creatinine, Ser 1.61 0.61 - 1.24 mg/dL   Calcium 8.7 (L) 8.9 - 10.3 mg/dL   GFR calc non Af Amer >60 >60 mL/min   GFR calc Af Amer >60 >60 mL/min   Anion gap 9 5 - 15  CBC  Result Value Ref Range   WBC 16.3 (H) 3.8 - 10.6 K/uL   RBC 4.55 4.40 - 5.90 MIL/uL   Hemoglobin 14.6 13.0 - 18.0 g/dL   HCT 09.6 04.5 - 40.9 %   MCV 95.8 80.0 - 100.0 fL   MCH 32.0 26.0 - 34.0 pg   MCHC 33.4 32.0 - 36.0 g/dL   RDW 81.1 91.4 - 78.2 %   Platelets 345 150 - 440 K/uL  Troponin I  Result Value Ref Range   Troponin I <0.03 <0.03 ng/mL  Brain natriuretic peptide  Result Value Ref Range   B Natriuretic Peptide 321.0 (H) 0.0 - 100.0 pg/mL  Ethanol  Result Value  Ref Range   Alcohol, Ethyl (B) <10 <10 mg/dL  Urine Drug Screen, Qualitative  Result Value Ref Range   Tricyclic, Ur Screen NONE DETECTED NONE DETECTED   Amphetamines, Ur Screen NONE DETECTED NONE DETECTED   MDMA (Ecstasy)Ur Screen NONE DETECTED NONE DETECTED   Cocaine Metabolite,Ur [place] NONE DETECTED NONE DETECTED   Opiate, Ur Screen NONE DETECTED NONE DETECTED   Phencyclidine (PCP) Ur S NONE DETECTED NONE DETECTED   Cannabinoid 50 Ng, Ur [place] NONE DETECTED NONE DETECTED   Barbiturates, Ur Screen NONE DETECTED NONE DETECTED   Benzodiazepine, Ur Scrn NONE DETECTED NONE DETECTED   Methadone Scn, Ur NONE DETECTED NONE DETECTED   Dg Chest 2 View  Result Date: 08/17/2017 CLINICAL DATA:  Shortness of breath and congestion. EXAM: CHEST  2 VIEW COMPARISON:  08/27/2016 FINDINGS: Mild cardiomegaly is similar to prior exam. There is vascular congestion and mild central pulmonary edema. Trace pleural effusions and fluid in the fissures. No confluent airspace disease. No pneumothorax. No acute osseous abnormalities are seen. IMPRESSION: Mild CHF with pulmonary edema and small pleural effusions. Electronically Signed   By: Lujean Rave.D.  On: 08/17/2017 20:59

## 2017-08-17 NOTE — ED Triage Notes (Signed)
Patient c/o shortness of breath and "congestion" in his chest for one week. Patient states he has a history of CHF. Patient denies any edema, but states his chest feels "heavy." Patient also c/o "a little dizziness that comes and goes."

## 2017-08-20 ENCOUNTER — Ambulatory Visit: Payer: Self-pay | Attending: Family | Admitting: Family

## 2017-08-20 ENCOUNTER — Encounter: Payer: Self-pay | Admitting: Family

## 2017-08-20 VITALS — BP 115/78 | HR 94 | Resp 18 | Ht 69.0 in | Wt 205.2 lb

## 2017-08-20 DIAGNOSIS — Z8249 Family history of ischemic heart disease and other diseases of the circulatory system: Secondary | ICD-10-CM | POA: Insufficient documentation

## 2017-08-20 DIAGNOSIS — Z7982 Long term (current) use of aspirin: Secondary | ICD-10-CM | POA: Insufficient documentation

## 2017-08-20 DIAGNOSIS — M542 Cervicalgia: Secondary | ICD-10-CM | POA: Insufficient documentation

## 2017-08-20 DIAGNOSIS — I2510 Atherosclerotic heart disease of native coronary artery without angina pectoris: Secondary | ICD-10-CM | POA: Insufficient documentation

## 2017-08-20 DIAGNOSIS — Z955 Presence of coronary angioplasty implant and graft: Secondary | ICD-10-CM | POA: Insufficient documentation

## 2017-08-20 DIAGNOSIS — Z905 Acquired absence of kidney: Secondary | ICD-10-CM | POA: Insufficient documentation

## 2017-08-20 DIAGNOSIS — R42 Dizziness and giddiness: Secondary | ICD-10-CM | POA: Insufficient documentation

## 2017-08-20 DIAGNOSIS — Z9081 Acquired absence of spleen: Secondary | ICD-10-CM | POA: Insufficient documentation

## 2017-08-20 DIAGNOSIS — K219 Gastro-esophageal reflux disease without esophagitis: Secondary | ICD-10-CM | POA: Insufficient documentation

## 2017-08-20 DIAGNOSIS — I5022 Chronic systolic (congestive) heart failure: Secondary | ICD-10-CM | POA: Insufficient documentation

## 2017-08-20 DIAGNOSIS — I252 Old myocardial infarction: Secondary | ICD-10-CM | POA: Insufficient documentation

## 2017-08-20 DIAGNOSIS — I110 Hypertensive heart disease with heart failure: Secondary | ICD-10-CM | POA: Insufficient documentation

## 2017-08-20 DIAGNOSIS — R0602 Shortness of breath: Secondary | ICD-10-CM | POA: Insufficient documentation

## 2017-08-20 DIAGNOSIS — I10 Essential (primary) hypertension: Secondary | ICD-10-CM

## 2017-08-20 DIAGNOSIS — G4733 Obstructive sleep apnea (adult) (pediatric): Secondary | ICD-10-CM | POA: Insufficient documentation

## 2017-08-20 DIAGNOSIS — Z79891 Long term (current) use of opiate analgesic: Secondary | ICD-10-CM | POA: Insufficient documentation

## 2017-08-20 DIAGNOSIS — Z720 Tobacco use: Secondary | ICD-10-CM

## 2017-08-20 DIAGNOSIS — Z79899 Other long term (current) drug therapy: Secondary | ICD-10-CM | POA: Insufficient documentation

## 2017-08-20 DIAGNOSIS — F17210 Nicotine dependence, cigarettes, uncomplicated: Secondary | ICD-10-CM | POA: Insufficient documentation

## 2017-08-20 DIAGNOSIS — Z833 Family history of diabetes mellitus: Secondary | ICD-10-CM | POA: Insufficient documentation

## 2017-08-20 MED ORDER — SPIRONOLACTONE 25 MG PO TABS: 25.0000 mg | ORAL_TABLET | Freq: Every day | ORAL | 5 refills | Status: DC

## 2017-08-20 MED ORDER — METOPROLOL SUCCINATE ER 25 MG PO TB24: 25.0000 mg | ORAL_TABLET | Freq: Every day | ORAL | 5 refills | Status: DC

## 2017-08-20 MED ORDER — POTASSIUM CHLORIDE ER 10 MEQ PO TBCR: 10.0000 meq | EXTENDED_RELEASE_TABLET | Freq: Every day | ORAL | 5 refills | Status: DC

## 2017-08-20 MED ORDER — FUROSEMIDE 40 MG PO TABS: 40.0000 mg | ORAL_TABLET | Freq: Every day | ORAL | 5 refills | Status: DC

## 2017-08-20 NOTE — Patient Instructions (Addendum)
Continue weighing daily and call for an overnight weight gain of > 2 pounds or a weekly weight gain of >5 pounds.  [facility] [phone]

## 2017-08-20 NOTE — Progress Notes (Signed)
Patient ID: Brian Porter, male    DOB: 06/09/1966, 52 y.o.   MRN: [id number]  HPI  [name] is a 52 y/o male with a history of obstructive sleep apnea (without CPAP), CAD, HTN, GERD, MI, current tobacco use and chronic heart failure.   Last echo was done 10/15/15 and showed an EF of 35% along with moderate MR/TR and an elevated PA pressure of 48 mm Hg. Had a cardiac catheterization done 10/16/15 and showed an EF of 15% along with multi-vessel disease. Recommended treatment for cardiomyopathy.   Was in the ED 08/17/17 due to heart failure exacerbation. Treated and released. 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 Treated and released.   He presents today for a follow-up visit with a chief complaint of minimal shortness of breath upon moderate exertion. He describes this as chronic in nature although tends to occur on an intermittent basis only. He has associated fatigue, light-headedness and neck pain along with this. He denies any chest pain, edema, palpitations, abdominal distention, difficulty sleeping or weight gain. His most recent ED visit the other day, he says that his symptoms had been present for a couple of weeks prior to going to the ED.   Past Medical History:  Diagnosis Date  . CAD (coronary artery disease)   . CHF (congestive heart failure) (HCC)   . GERD (gastroesophageal reflux disease)   . Hypertension   . MI, old   . Sleep apnea    Past Surgical History:  Procedure Laterality Date  . CARDIAC CATHETERIZATION Right 10/16/2015   Procedure: Left Heart Cath and Coronary Angiography;  Surgeon: Laurier NancyShaukat A Khan, MD;  Location: [facility] INVASIVE CV LAB;  Service: Cardiovascular;  Laterality: Right;  . CORONARY ANGIOPLASTY WITH STENT PLACEMENT  approx 2 years ago  . HERNIA REPAIR Left 11/29/2016   [facility]  . PARTIAL NEPHRECTOMY Left   . SPLENECTOMY     Family History  Problem Relation Age of Onset  . Heart failure Mother   . Hypertension Mother   . Diabetes Mellitus II  Brother    Social History   Tobacco Use  . Smoking status: Current Every Day Smoker    Packs/day: 1.00    Types: Cigarettes  . Smokeless tobacco: Never Used  Substance Use Topics  . Alcohol use: No    Alcohol/week: 0.0 oz   No Known Allergies  Prior to Admission medications   Medication Sig Start Date End Date Taking? Authorizing Provider  aspirin 81 MG EC tablet Take 1 tablet (81 mg total) by mouth daily. 08/29/16  Yes Houston SirenSainani, Vivek J, MD  furosemide (LASIX) 40 MG tablet Take 1 tablet (40 mg total) by mouth daily. 01/13/17 01/13/18 Yes Drinda Belgard A, FNP  meloxicam (MOBIC) 15 MG tablet Take 1 tablet (15 mg total) by mouth daily. 06/06/17  Yes Cuthriell, Delorise RoyalsJonathan D, PA-C  metoprolol succinate (TOPROL XL) 25 MG 24 hr tablet Take 1 tablet (25 mg total) by mouth daily. 01/13/17  Yes Delma FreezeHackney, Mariaclara Spear A, FNP  oxyCODONE-acetaminophen (ROXICET) 5-325 MG tablet Take 1 tablet every 6 (six) hours as needed by mouth. 05/16/17 05/16/18 Yes Joni ReiningSmith, Ronald K, PA-C  potassium chloride (K-DUR) 10 MEQ tablet Take 1 tablet (10 mEq total) by mouth daily. 01/13/17  Yes Clarisa KindredHackney, Jassmin Kemmerer A, FNP  ranitidine (ZANTAC) 150 MG tablet Take 150 mg by mouth 2 (two) times daily as needed for heartburn.   Yes [provider]  spironolactone (ALDACTONE) 25 MG tablet Take 1 tablet (25 mg  total) by mouth daily. 01/13/17  Yes Delma Freeze, FNP   Review of Systems  Constitutional: Positive for fatigue (improving). Negative for appetite change.  HENT: Negative for congestion and postnasal drip.   Eyes: Negative.   Respiratory: Positive for shortness of breath (at times). Negative for cough, chest tightness and wheezing.   Cardiovascular: Negative for chest pain, palpitations and leg swelling.  Gastrointestinal: Negative for abdominal distention and abdominal pain.  Endocrine: Negative.   Genitourinary: Negative.   Musculoskeletal: Positive for neck pain. Negative for arthralgias and back pain.  Skin: Negative.    Allergic/Immunologic: Negative.   Neurological: Positive for light-headedness (at times). Negative for dizziness.  Hematological: Negative for adenopathy. Does not bruise/bleed easily.  Psychiatric/Behavioral: Negative for dysphoric mood, sleep disturbance (sleeping on 2 pillows) and suicidal ideas. The patient is not nervous/anxious.    Vitals:   08/20/17 1129  BP: 115/78  Pulse: 94  Resp: 18  SpO2: 98%  Weight: 205 lb 4 oz (93.1 kg)  Height: 5\' 9"  (1.753 m)   Wt Readings from Last 3 Encounters:  08/20/17 205 lb 4 oz (93.1 kg)  08/17/17 190 lb (86.2 kg)  06/13/17 190 lb (86.2 kg)   Lab Results  Component Value Date   CREATININE 0.86 08/17/2017   CREATININE 1.01 06/13/2017   CREATININE 0.81 11/23/2016    Physical Exam  Constitutional: He is oriented to person, place, and time. He appears well-developed and well-nourished.  HENT:  Head: Normocephalic and atraumatic.  Neck: Normal range of motion. Neck supple. No JVD present.  Cardiovascular: Normal rate and regular rhythm.  Pulmonary/Chest: Effort normal. He has no wheezes. He has no rales.  Abdominal: Soft. He exhibits no distension. There is no tenderness.  Musculoskeletal: He exhibits no edema or tenderness.  Neurological: He is alert and oriented to person, place, and time.  Skin: Skin is warm and dry.  Psychiatric: He has a normal mood and affect. His behavior is normal. Thought content normal.  Nursing note and vitals reviewed.     Assessment & Plan:   1: Chronic heart failure with reduced ejection fraction- - NYHA class II - euvolemic - weighing daily. Reminded to call for an overnight weight gain of >2 pounds or a weekly weight gain of >5 pounds.  - tolerating metoprolol succinate without known side effects - not adding salt to his food and tries to follow a 2000mg  sodium diet - BP may limit titration of metoprolol or addition of entresto - emphasized the importance of calling us when symptoms begin so that  he can be seen in clinic and hopefully prevent going to the ED  2: HTN- - BP on the low side  - is intermittently light-headed; emphasized slow position changes - currently without insurance and has been seen at [facility] in the past. Contact information given to him regarding this so he can get re-established with them - reviewed BMP from 08/17/17 which showed sodium 138, potassium 4.4 and GFR >60 - not interested in going to [facility]  3: Tobacco use- - smokes 1/2 ppd of cigarettes - complete cessation discussed for 3 minutes with him  Medication list was reviewed.  Return in 1 month or sooner for any questions/problems before then.

## 2017-09-12 ENCOUNTER — Ambulatory Visit: Payer: Self-pay | Attending: Family | Admitting: Family

## 2017-09-12 ENCOUNTER — Encounter: Payer: Self-pay | Admitting: Family

## 2017-09-12 VITALS — BP 104/79 | HR 95 | Resp 18 | Ht 69.0 in | Wt 205.0 lb

## 2017-09-12 DIAGNOSIS — I10 Essential (primary) hypertension: Secondary | ICD-10-CM

## 2017-09-12 DIAGNOSIS — G4733 Obstructive sleep apnea (adult) (pediatric): Secondary | ICD-10-CM | POA: Insufficient documentation

## 2017-09-12 DIAGNOSIS — R42 Dizziness and giddiness: Secondary | ICD-10-CM | POA: Insufficient documentation

## 2017-09-12 DIAGNOSIS — J40 Bronchitis, not specified as acute or chronic: Secondary | ICD-10-CM | POA: Insufficient documentation

## 2017-09-12 DIAGNOSIS — K219 Gastro-esophageal reflux disease without esophagitis: Secondary | ICD-10-CM | POA: Insufficient documentation

## 2017-09-12 DIAGNOSIS — Z720 Tobacco use: Secondary | ICD-10-CM

## 2017-09-12 DIAGNOSIS — I252 Old myocardial infarction: Secondary | ICD-10-CM | POA: Insufficient documentation

## 2017-09-12 DIAGNOSIS — Z791 Long term (current) use of non-steroidal anti-inflammatories (NSAID): Secondary | ICD-10-CM | POA: Insufficient documentation

## 2017-09-12 DIAGNOSIS — I2510 Atherosclerotic heart disease of native coronary artery without angina pectoris: Secondary | ICD-10-CM | POA: Insufficient documentation

## 2017-09-12 DIAGNOSIS — Z955 Presence of coronary angioplasty implant and graft: Secondary | ICD-10-CM | POA: Insufficient documentation

## 2017-09-12 DIAGNOSIS — Z79899 Other long term (current) drug therapy: Secondary | ICD-10-CM | POA: Insufficient documentation

## 2017-09-12 DIAGNOSIS — I5022 Chronic systolic (congestive) heart failure: Secondary | ICD-10-CM | POA: Insufficient documentation

## 2017-09-12 DIAGNOSIS — Z905 Acquired absence of kidney: Secondary | ICD-10-CM | POA: Insufficient documentation

## 2017-09-12 DIAGNOSIS — I110 Hypertensive heart disease with heart failure: Secondary | ICD-10-CM | POA: Insufficient documentation

## 2017-09-12 DIAGNOSIS — Z9081 Acquired absence of spleen: Secondary | ICD-10-CM | POA: Insufficient documentation

## 2017-09-12 DIAGNOSIS — Z7982 Long term (current) use of aspirin: Secondary | ICD-10-CM | POA: Insufficient documentation

## 2017-09-12 DIAGNOSIS — F17210 Nicotine dependence, cigarettes, uncomplicated: Secondary | ICD-10-CM | POA: Insufficient documentation

## 2017-09-12 MED ORDER — AZITHROMYCIN 250 MG PO TABS: ORAL_TABLET | ORAL | 0 refills | Status: DC

## 2017-09-12 NOTE — Patient Instructions (Signed)
Continue weighing daily and call for an overnight weight gain of > 2 pounds or a weekly weight gain of >5 pounds. 

## 2017-09-12 NOTE — Progress Notes (Signed)
Patient ID: Dahlia Bailiffavid M Santoro, male    DOB: 07/15/1965, 52 y.o.   MRN: [id number]  HPI  Mr [name] is a 52 y/o male with a history of obstructive sleep apnea (without CPAP), CAD, HTN, GERD, MI, current tobacco use and chronic heart failure.   Last echo was done 10/15/15 and showed an EF of 35% along with moderate MR/TR and an elevated PA pressure of 48 mm Hg. Had a cardiac catheterization done 10/16/15 and showed an EF of 15% along with multi-vessel disease. Recommended treatment for cardiomyopathy.   Was in the ED 08/17/17 due to heart failure exacerbation. Treated and released. 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 Treated and released.   He presents today for an acute visit with a chief complaint of minimal shortness of breath upon moderate exertion. He says that this has gotten worse over the last couple of days. He has associated cough, fatigue, wheezing, light-headedness and neck pain. He denies any difficulty sleeping, weight gain, abdominal distention, palpitations, edema or chest pain. Denies any fever. Has been drinking a 5 hour energy drink daily for quite some time.   Past Medical History:  Diagnosis Date  . CAD (coronary artery disease)   . CHF (congestive heart failure) (HCC)   . GERD (gastroesophageal reflux disease)   . Hypertension   . MI, old   . Sleep apnea    Past Surgical History:  Procedure Laterality Date  . CARDIAC CATHETERIZATION Right 10/16/2015   Procedure: Left Heart Cath and Coronary Angiography;  Surgeon: Laurier NancyShaukat A Khan, MD;  Location: [facility];  Service: Cardiovascular;  Laterality: Right;  . CORONARY ANGIOPLASTY WITH STENT PLACEMENT  approx 2 years ago  . HERNIA REPAIR Left 11/29/2016   [facility]  . PARTIAL NEPHRECTOMY Left   . SPLENECTOMY     Family History  Problem Relation Age of Onset  . Heart failure Mother   . Hypertension Mother   . Diabetes Mellitus II Brother    Social History   Tobacco Use  . Smoking status: Current Every  Day Smoker    Packs/day: 1.00    Types: Cigarettes  . Smokeless tobacco: Never Used  Substance Use Topics  . Alcohol use: No    Alcohol/week: 0.0 oz   No Known Allergies  Prior to Admission medications   Medication Sig Start Date End Date Taking? Authorizing Provider  aspirin 81 MG EC tablet Take 1 tablet (81 mg total) by mouth daily. 08/29/16  Yes Houston SirenSainani, Vivek J, MD  furosemide (LASIX) 40 MG tablet Take 1 tablet (40 mg total) by mouth daily. 08/20/17 08/20/18 Yes Dianara Smullen, Inetta Fermoina A, FNP  meloxicam (MOBIC) 15 MG tablet Take 1 tablet (15 mg total) by mouth daily. 06/06/17  Yes Cuthriell, Delorise RoyalsJonathan D, PA-C  metoprolol succinate (TOPROL XL) 25 MG 24 hr tablet Take 1 tablet (25 mg total) by mouth daily. 08/20/17  Yes Delma FreezeHackney, Royal Beirne A, FNP  oxyCODONE-acetaminophen (ROXICET) 5-325 MG tablet Take 1 tablet every 6 (six) hours as needed by mouth. 05/16/17 05/16/18 Yes Joni ReiningSmith, Ronald K, PA-C  potassium chloride (K-DUR) 10 MEQ tablet Take 1 tablet (10 mEq total) by mouth daily. 08/20/17  Yes Clarisa KindredHackney, Claudett Bayly A, FNP  ranitidine (ZANTAC) 150 MG tablet Take 150 mg by mouth 2 (two) times daily as needed for heartburn.   Yes [provider]  spironolactone (ALDACTONE) 25 MG tablet Take 1 tablet (25 mg total) by mouth daily. 08/20/17  Yes Delma FreezeHackney, Jamira Barfuss A, FNP  azithromycin (ZITHROMAX Z-PAK)  250 MG tablet Take as directed 09/12/17   Delma Freeze, FNP    Review of Systems  Constitutional: Positive for fatigue (improving). Negative for appetite change and fever.  HENT: Negative for congestion and postnasal drip.   Eyes: Negative.   Respiratory: Positive for cough, shortness of breath (worsening "a little") and wheezing. Negative for chest tightness.   Cardiovascular: Negative for chest pain, palpitations and leg swelling.  Gastrointestinal: Negative for abdominal distention and abdominal pain.  Endocrine: Negative.   Genitourinary: Negative.   Musculoskeletal: Positive for neck pain. Negative for arthralgias  and back pain.  Skin: Negative.   Allergic/Immunologic: Negative.   Neurological: Positive for light-headedness (at times). Negative for dizziness.  Hematological: Negative for adenopathy. Does not bruise/bleed easily.  Psychiatric/Behavioral: Negative for dysphoric mood, sleep disturbance (sleeping on 2 pillows) and suicidal ideas. The patient is not nervous/anxious.    Vitals:   09/12/17 1209  BP: 104/79  Pulse: 95  Resp: 18  SpO2: 98%  Weight: 205 lb (93 kg)  Height: 5\' 9"  (1.753 m)   Wt Readings from Last 3 Encounters:  09/12/17 205 lb (93 kg)  08/20/17 205 lb 4 oz (93.1 kg)  08/17/17 190 lb (86.2 kg)   Lab Results  Component Value Date   CREATININE 0.86 08/17/2017   CREATININE 1.01 06/13/2017   CREATININE 0.81 11/23/2016    Physical Exam  Constitutional: He is oriented to person, place, and time. He appears well-developed and well-nourished.  HENT:  Head: Normocephalic and atraumatic.  Neck: Normal range of motion. Neck supple. No JVD present.  Cardiovascular: Normal rate and regular rhythm.  Pulmonary/Chest: Effort normal. He has no wheezes. He has no rales.  Abdominal: Soft. He exhibits no distension. There is no tenderness.  Musculoskeletal: He exhibits no edema or tenderness.  Neurological: He is alert and oriented to person, place, and time.  Skin: Skin is warm and dry.  Psychiatric: He has a normal mood and affect. His behavior is normal. Thought content normal.  Nursing note and vitals reviewed.     Assessment & Plan:   1: Chronic heart failure with reduced ejection fraction- - NYHA class II - euvolemic - weighing daily. Reminded to call for an overnight weight gain of >2 pounds or a weekly weight gain of >5 pounds.  - weight stable from last time he was here - not adding salt to his food and tries to follow a 2000mg  sodium diet - BP may limit titration of metoprolol or addition of entresto - instructed to stop drinking the 5 hour energy drink  2:  HTN- - BP on the low side  - is intermittently light-headed; emphasized slow position changes - currently without insurance and has been seen at [facility] in the past. Contact information given to him regarding this so he can get re-established with them - reviewed BMP from 08/17/17 which showed sodium 138, potassium 4.4 and GFR >60 - [facility]  3: Tobacco use- - smokes 1 ppd of cigarettes - complete cessation discussed for 3 minutes with him  4: Bronchitis- - Zpack sent in today  Patient did not bring his medications nor a list. Each medication was verbally reviewed with the patient and he was encouraged to bring the bottles to every visit to confirm accuracy of list.  Return in 1 month or sooner for any questions/problems before then.

## 2017-09-15 ENCOUNTER — Ambulatory Visit: Payer: Self-pay | Admitting: Family

## 2017-10-09 NOTE — Progress Notes (Signed)
Patient ID: Brian Porter, male    DOB: 05/16/1966, 52 y.o.   MRN: [id number]  HPI  Mr [name] is a 52 y/o male with a history of obstructive sleep apnea (without CPAP), CAD, HTN, GERD, MI, current tobacco use and chronic heart failure.   Last echo was done 10/15/15 and showed an EF of 35% along with moderate MR/TR and an elevated PA pressure of 48 mm Hg. Had a cardiac catheterization done 10/16/15 and showed an EF of 15% along with multi-vessel disease. Recommended treatment for cardiomyopathy.   Was in the ED 08/17/17 due to heart failure exacerbation. Treated and released. 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 Treated and released.   He presents today for a follow-up visit with a chief complaint of moderate shortness of breath upon minimal exertion. He describes this as chronic in nature having been present for several years in varying levels of severity. He has associated fatigue, decreased appetite, light-headedness, wheezing, cough and gradual weight gain. He denies any difficulty sleeping, abdominal distention, palpitations, edema or chest pain.   Past Medical History:  Diagnosis Date  . CAD (coronary artery disease)   . CHF (congestive heart failure) (HCC)   . GERD (gastroesophageal reflux disease)   . Hypertension   . MI, old   . Sleep apnea    Past Surgical History:  Procedure Laterality Date  . CARDIAC CATHETERIZATION Right 10/16/2015   Procedure: Left Heart Cath and Coronary Angiography;  Surgeon: Laurier NancyShaukat A Khan, MD;  Location: [facility];  Service: Cardiovascular;  Laterality: Right;  . CORONARY ANGIOPLASTY WITH STENT PLACEMENT  approx 2 years ago  . HERNIA REPAIR Left 11/29/2016   [facility]  . PARTIAL NEPHRECTOMY Left   . SPLENECTOMY     Family History  Problem Relation Age of Onset  . Heart failure Mother   . Hypertension Mother   . Diabetes Mellitus II Brother    Social History   Tobacco Use  . Smoking status: Current Every Day Smoker     Packs/day: 1.00    Types: Cigarettes  . Smokeless tobacco: Never Used  Substance Use Topics  . Alcohol use: No    Alcohol/week: 0.0 oz   No Known Allergies  Prior to Admission medications   Medication Sig Start Date End Date Taking? Authorizing Provider  aspirin 81 MG EC tablet Take 1 tablet (81 mg total) by mouth daily. 08/29/16  Yes Houston SirenSainani, Vivek J, MD  furosemide (LASIX) 40 MG tablet Take 1 tablet (40 mg total) by mouth daily. 08/20/17 08/20/18 Yes Clarisa KindredHackney, Elyanah Farino A, FNP  metoprolol succinate (TOPROL XL) 25 MG 24 hr tablet Take 1 tablet (25 mg total) by mouth daily. 08/20/17  Yes Clarisa KindredHackney, Maryanna Stuber A, FNP  potassium chloride (K-DUR) 10 MEQ tablet Take 1 tablet (10 mEq total) by mouth daily. 08/20/17  Yes Clarisa KindredHackney, Sunaina Ferrando A, FNP  ranitidine (ZANTAC) 150 MG tablet Take 150 mg by mouth 2 (two) times daily as needed for heartburn.   Yes [provider]  spironolactone (ALDACTONE) 25 MG tablet Take 1 tablet (25 mg total) by mouth daily. 08/20/17  Yes Delma FreezeHackney, Ian Castagna A, FNP    Review of Systems  Constitutional: Positive for appetite change (decreased) and fatigue. Negative for fever.  HENT: Negative for congestion and postnasal drip.   Eyes: Negative.   Respiratory: Positive for cough, shortness of breath (worsening "a little") and wheezing. Negative for chest tightness.   Cardiovascular: Negative for chest pain, palpitations and leg swelling.  Gastrointestinal: Negative for abdominal distention and abdominal pain.  Endocrine: Negative.   Genitourinary: Negative.   Musculoskeletal: Negative for arthralgias, back pain and neck pain.  Skin: Negative.   Allergic/Immunologic: Negative.   Neurological: Positive for light-headedness (at times). Negative for dizziness.  Hematological: Negative for adenopathy. Does not bruise/bleed easily.  Psychiatric/Behavioral: Negative for dysphoric mood, sleep disturbance (sleeping on 2 pillows) and suicidal ideas. The patient is not nervous/anxious.    Vitals:    10/10/17 1304  BP: 113/82  Pulse: 92  Resp: 18  SpO2: 97%  Weight: 211 lb 6 oz (95.9 kg)  Height: 5\' 9"  (1.753 m)   Wt Readings from Last 3 Encounters:  10/10/17 211 lb 6 oz (95.9 kg)  09/12/17 205 lb (93 kg)  08/20/17 205 lb 4 oz (93.1 kg)   Lab Results  Component Value Date   CREATININE 0.86 08/17/2017   CREATININE 1.01 06/13/2017   CREATININE 0.81 11/23/2016    Physical Exam  Constitutional: He is oriented to person, place, and time. He appears well-developed and well-nourished.  HENT:  Head: Normocephalic and atraumatic.  Neck: Normal range of motion. Neck supple. No JVD present.  Cardiovascular: Normal rate and regular rhythm.  Pulmonary/Chest: Effort normal. He has no wheezes. He has no rales.  Abdominal: Soft. He exhibits no distension. There is no tenderness.  Musculoskeletal: He exhibits no edema or tenderness.  Neurological: He is alert and oriented to person, place, and time.  Skin: Skin is warm and dry.  Psychiatric: He has a normal mood and affect. His behavior is normal. Thought content normal.  Nursing note and vitals reviewed.     Assessment & Plan:   1: Chronic heart failure with reduced ejection fraction- - NYHA class III - euvolemic in appearance although weight is up - weighing daily. Reminded to call for an overnight weight gain of >2 pounds or a weekly weight gain of >5 pounds.  - weight up 6 pounds from 09/12/17 - not adding salt to his food and tries to follow a 2000mg  sodium diet - BP may limit titration of metoprolol or addition of entresto - will increase his diuretic/potassium to 2 tablets daily for the next 3-4 days. Should he respond well to a couple of days of the increased dose, he can go back down to his original dose - will check a BMP at his next visit since he'll be taking extra diuretic/potassium  2: HTN- - BP looks good today - currently without insurance and has been seen at [facility] in the past. He says that he's been  told that they aren't currently accepting any new patients - reviewed BMP from 08/17/17 which showed sodium 138, potassium 4.4 and GFR >60 - not [facility]  3: Tobacco use- - smokes 1 ppd of cigarettes - complete cessation discussed for 3 minutes with him  Patient did not bring his medications nor a list. Each medication was verbally reviewed with the patient and he was encouraged to bring the bottles to every visit to confirm accuracy of list.  Return in 4 days for a recheck and for lab work

## 2017-10-10 ENCOUNTER — Encounter: Payer: Self-pay | Admitting: Family

## 2017-10-10 ENCOUNTER — Ambulatory Visit: Payer: Self-pay | Attending: Family | Admitting: Family

## 2017-10-10 VITALS — BP 113/82 | HR 92 | Resp 18 | Ht 69.0 in | Wt 211.4 lb

## 2017-10-10 DIAGNOSIS — Z955 Presence of coronary angioplasty implant and graft: Secondary | ICD-10-CM | POA: Insufficient documentation

## 2017-10-10 DIAGNOSIS — F17210 Nicotine dependence, cigarettes, uncomplicated: Secondary | ICD-10-CM | POA: Insufficient documentation

## 2017-10-10 DIAGNOSIS — I252 Old myocardial infarction: Secondary | ICD-10-CM | POA: Insufficient documentation

## 2017-10-10 DIAGNOSIS — Z720 Tobacco use: Secondary | ICD-10-CM

## 2017-10-10 DIAGNOSIS — Z7982 Long term (current) use of aspirin: Secondary | ICD-10-CM | POA: Insufficient documentation

## 2017-10-10 DIAGNOSIS — I5022 Chronic systolic (congestive) heart failure: Secondary | ICD-10-CM

## 2017-10-10 DIAGNOSIS — I110 Hypertensive heart disease with heart failure: Secondary | ICD-10-CM | POA: Insufficient documentation

## 2017-10-10 DIAGNOSIS — G4733 Obstructive sleep apnea (adult) (pediatric): Secondary | ICD-10-CM | POA: Insufficient documentation

## 2017-10-10 DIAGNOSIS — I10 Essential (primary) hypertension: Secondary | ICD-10-CM

## 2017-10-10 DIAGNOSIS — Z79899 Other long term (current) drug therapy: Secondary | ICD-10-CM | POA: Insufficient documentation

## 2017-10-10 DIAGNOSIS — K219 Gastro-esophageal reflux disease without esophagitis: Secondary | ICD-10-CM | POA: Insufficient documentation

## 2017-10-10 DIAGNOSIS — I2510 Atherosclerotic heart disease of native coronary artery without angina pectoris: Secondary | ICD-10-CM | POA: Insufficient documentation

## 2017-10-10 DIAGNOSIS — I509 Heart failure, unspecified: Secondary | ICD-10-CM | POA: Insufficient documentation

## 2017-10-10 NOTE — Patient Instructions (Addendum)
Continue weighing daily and call for an overnight weight gain of > 2 pounds or a weekly weight gain of >5 pounds.  Increase your furosemide (lasix) to 2 tablets daily along with increasing your potassium to 2 tablets a day for the next 3-4 days.

## 2017-10-11 NOTE — Progress Notes (Deleted)
Patient ID: [name], male    DOB: 04-17-1966, 52 y.o.   MRN: [id number]  HPI  Mr Maqueda is a 52 y/o male with a history of obstructive sleep apnea (without CPAP), CAD, HTN, GERD, MI, current tobacco use and chronic heart failure.   Last echo was done 10/15/15 and showed an EF of 35% along with moderate MR/TR and an elevated PA pressure of 48 mm Hg. Had a cardiac catheterization done 10/16/15 and showed an EF of 15% along with multi-vessel disease. Recommended treatment for cardiomyopathy.   Was in the ED 08/17/17 due to heart failure exacerbation. Treated and released. 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 Treated and released.   He presents today for a follow-up visit with a chief complaint of   Past Medical History:  Diagnosis Date  . CAD (coronary artery disease)   . CHF (congestive heart failure) (HCC)   . GERD (gastroesophageal reflux disease)   . Hypertension   . MI, old   . Sleep apnea    Past Surgical History:  Procedure Laterality Date  . CARDIAC CATHETERIZATION Right 10/16/2015   Procedure: Left Heart Cath and Coronary Angiography;  Surgeon: Laurier Nancy, MD;  Location: [facility];  Service: Cardiovascular;  Laterality: Right;  . CORONARY ANGIOPLASTY WITH STENT PLACEMENT  approx 2 years ago  . HERNIA REPAIR Left 11/29/2016   [facility]  . PARTIAL NEPHRECTOMY Left   . SPLENECTOMY     Family History  Problem Relation Age of Onset  . Heart failure Mother   . Hypertension Mother   . Diabetes Mellitus II Brother    Social History   Tobacco Use  . Smoking status: Current Every Day Smoker    Packs/day: 1.00    Types: Cigarettes  . Smokeless tobacco: Never Used  Substance Use Topics  . Alcohol use: No    Alcohol/week: 0.0 oz   No Known Allergies    Review of Systems  Constitutional: Positive for appetite change (decreased) and fatigue. Negative for fever.  HENT: Negative for congestion and postnasal drip.   Eyes: Negative.   Respiratory:  Positive for cough, shortness of breath (worsening "a little") and wheezing. Negative for chest tightness.   Cardiovascular: Negative for chest pain, palpitations and leg swelling.  Gastrointestinal: Negative for abdominal distention and abdominal pain.  Endocrine: Negative.   Genitourinary: Negative.   Musculoskeletal: Negative for arthralgias, back pain and neck pain.  Skin: Negative.   Allergic/Immunologic: Negative.   Neurological: Positive for light-headedness (at times). Negative for dizziness.  Hematological: Negative for adenopathy. Does not bruise/bleed easily.  Psychiatric/Behavioral: Negative for dysphoric mood, sleep disturbance (sleeping on 2 pillows) and suicidal ideas. The patient is not nervous/anxious.      Physical Exam  Constitutional: He is oriented to person, place, and time. He appears well-developed and well-nourished.  HENT:  Head: Normocephalic and atraumatic.  Neck: Normal range of motion. Neck supple. No JVD present.  Cardiovascular: Normal rate and regular rhythm.  Pulmonary/Chest: Effort normal. He has no wheezes. He has no rales.  Abdominal: Soft. He exhibits no distension. There is no tenderness.  Musculoskeletal: He exhibits no edema or tenderness.  Neurological: He is alert and oriented to person, place, and time.  Skin: Skin is warm and dry.  Psychiatric: He has a normal mood and affect. His behavior is normal. Thought content normal.  Nursing note and vitals reviewed.     Assessment & Plan:   1: Chronic heart failure with  reduced ejection fraction- - NYHA class III - euvolemic in appearance although weight is up - weighing daily. Reminded to call for an overnight weight gain of >2 pounds or a weekly weight gain of >5 pounds.  - weight up 6 pounds from 09/12/17 - not adding salt to his food and tries to follow a 2000mg  sodium diet - BP may limit titration of metoprolol or addition of entresto - took extra furosemide/potassium for a few days -  will check a BMP today due to extra diuretic usage  2: HTN- - BP looks good today - currently without insurance and has been seen at [facility] in the past. He says that he's been told that they aren't currently accepting any new patients - reviewed BMP from 08/17/17 which showed sodium 138, potassium 4.4 and GFR >60 - not [facility]  3: Tobacco use- - smokes 1 ppd of cigarettes - complete cessation discussed for 3 minutes with him  Patient did not bring his medications nor a list. Each medication was verbally reviewed with the patient and he was encouraged to bring the bottles to every visit to confirm accuracy of list.

## 2017-10-14 ENCOUNTER — Ambulatory Visit: Payer: Self-pay | Admitting: Family

## 2017-10-25 NOTE — Progress Notes (Signed)
Patient ID: Brian Porter, male    DOB: 1966/06/16, 52 y.o.   MRN: [id number]  HPI  Mr Shear is a 52 y/o male with a history of obstructive sleep apnea (without CPAP), CAD, HTN, GERD, MI, current tobacco use and chronic heart failure.   Last echo was done 10/15/15 and showed an EF of 35% along with moderate MR/TR and an elevated PA pressure of 48 mm Hg. Had a cardiac catheterization done 10/16/15 and showed an EF of 15% along with multi-vessel disease. Recommended treatment for cardiomyopathy.   Was in the ED 08/17/17 due to heart failure exacerbation. Treated and released. 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 Treated and released.   He presents today for a follow-up visit with a chief complaint of minimal shortness of breath upon moderate exertion. He describes this as chronic in nature having been present for several years. He does feel like his shortness of breath has improved since he was last here and took extra doses of furosemide/potassium. He has associated fatigue, cough and occasional light-headedness along with this. He denies any difficulty sleeping, abdominal distention, palpitations, edema or chest pain.  Past Medical History:  Diagnosis Date  . CAD (coronary artery disease)   . CHF (congestive heart failure) (HCC)   . GERD (gastroesophageal reflux disease)   . Hypertension   . MI, old   . Sleep apnea    Past Surgical History:  Procedure Laterality Date  . CARDIAC CATHETERIZATION Right 10/16/2015   Procedure: Left Heart Cath and Coronary Angiography;  Surgeon: Laurier Nancy, MD;  Location: [facility] INVASIVE CV [facility];  Service: Cardiovascular;  Laterality: Right;  . CORONARY ANGIOPLASTY WITH STENT PLACEMENT  approx 2 years ago  . HERNIA REPAIR Left 11/29/2016   [facility]  . PARTIAL NEPHRECTOMY Left   . SPLENECTOMY     Family History  Problem Relation Age of Onset  . Heart failure Mother   . Hypertension Mother   . Diabetes Mellitus II Brother    Social History    Tobacco Use  . Smoking status: Current Every Day Smoker    Packs/day: 1.00    Types: Cigarettes  . Smokeless tobacco: Never Used  Substance Use Topics  . Alcohol use: No    Alcohol/week: 0.0 oz   No Known Allergies  Prior to Admission medications   Medication Sig Start Date End Date Taking? Authorizing Provider  aspirin 81 MG EC tablet Take 1 tablet (81 mg total) by mouth daily. 08/29/16  Yes Houston Siren, MD  furosemide (LASIX) 40 MG tablet Take 1 tablet (40 mg total) by mouth daily. 08/20/17 08/20/18 Yes Clarisa Kindred A, FNP  metoprolol succinate (TOPROL XL) 25 MG 24 hr tablet Take 1 tablet (25 mg total) by mouth daily. 08/20/17  Yes Clarisa Kindred A, FNP  potassium chloride (K-DUR) 10 MEQ tablet Take 1 tablet (10 mEq total) by mouth daily. 08/20/17  Yes Clarisa Kindred A, FNP  ranitidine (ZANTAC) 150 MG tablet Take 150 mg by mouth 2 (two) times daily as needed for heartburn.   Yes [provider]  spironolactone (ALDACTONE) 25 MG tablet Take 1 tablet (25 mg total) by mouth daily. 08/20/17  Yes Delma Freeze, FNP   Review of Systems  Constitutional: Positive for appetite change (decreased) and fatigue. Negative for fever.  HENT: Negative for congestion and postnasal drip.   Eyes: Negative.   Respiratory: Positive for cough and shortness of breath ("better"). Negative for chest tightness and wheezing.  Cardiovascular: Negative for chest pain, palpitations and leg swelling.  Gastrointestinal: Negative for abdominal distention and abdominal pain.  Endocrine: Negative.   Genitourinary: Negative.   Musculoskeletal: Negative for arthralgias, back pain and neck pain.  Skin: Negative.   Allergic/Immunologic: Negative.   Neurological: Positive for light-headedness (at times). Negative for dizziness.  Hematological: Negative for adenopathy. Does not bruise/bleed easily.  Psychiatric/Behavioral: Negative for dysphoric mood, sleep disturbance (sleeping on 2 pillows) and suicidal  ideas. The patient is not nervous/anxious.    Vitals:   10/27/17 1449  BP: 123/85  Pulse: 90  Resp: 18  SpO2: 97%  Weight: 209 lb 2 oz (94.9 kg)  Height:  (1.778 m)   Wt Readings from Last 3 Encounters:  10/27/17 209 lb 2 oz (94.9 kg)  10/10/17 211 lb 6 oz (95.9 kg)  09/12/17 205 lb (93 kg)   Lab Results  Component Value Date   CREATININE 0.86 08/17/2017   CREATININE 1.01 06/13/2017   CREATININE 0.81 11/23/2016   Physical Exam  Constitutional: He is oriented to person, place, and time. He appears well-developed and well-nourished.  HENT:  Head: Normocephalic and atraumatic.  Neck: Normal range of motion. Neck supple. No JVD present.  Cardiovascular: Normal rate and regular rhythm.  Pulmonary/Chest: Effort normal. He has no wheezes. He has no rales.  Abdominal: Soft. He exhibits no distension. There is no tenderness.  Musculoskeletal: He exhibits no edema or tenderness.  Neurological: He is alert and oriented to person, place, and time.  Skin: Skin is warm and dry.  Psychiatric: He has a normal mood and affect. His behavior is normal. Thought content normal.  Nursing note and vitals reviewed.     Assessment & Plan:   1: Chronic heart failure with reduced ejection fraction- - NYHA class II - euvolemic in appearance  - weighing daily. Reminded to call for an overnight weight gain of >2 pounds or a weekly weight gain of >5 pounds.  - weight down 2 pounds since he was last here - not adding salt to his food and tries to follow a  sodium diet - BP may limit titration of metoprolol or addition of entresto - took extra furosemide/potassium for a few days since he was last here - will check a BMP today due to extra diuretic usage  2: HTN- - BP looks good today - he hasn't been able to get established with [facility] yet - reviewed BMP from 08/17/17 which showed sodium 138, potassium 4.4 and GFR >60 - not interested in going to [facility]  3: Tobacco use- - smokes 1 ppd of cigarettes - complete cessation discussed for 3 minutes with him  Patient did not bring his medications nor a list. Each medication was verbally reviewed with the patient and he was encouraged to bring the bottles to every visit to confirm accuracy of list.  Return in 3 months or sooner for any questions/problems before then.

## 2017-10-27 ENCOUNTER — Ambulatory Visit: Payer: Self-pay | Attending: Family | Admitting: Family

## 2017-10-27 ENCOUNTER — Encounter: Payer: Self-pay | Admitting: Family

## 2017-10-27 VITALS — BP 123/85 | HR 90 | Resp 18 | Ht 70.0 in | Wt 209.1 lb

## 2017-10-27 DIAGNOSIS — I10 Essential (primary) hypertension: Secondary | ICD-10-CM

## 2017-10-27 DIAGNOSIS — F17210 Nicotine dependence, cigarettes, uncomplicated: Secondary | ICD-10-CM | POA: Insufficient documentation

## 2017-10-27 DIAGNOSIS — Z955 Presence of coronary angioplasty implant and graft: Secondary | ICD-10-CM | POA: Insufficient documentation

## 2017-10-27 DIAGNOSIS — K219 Gastro-esophageal reflux disease without esophagitis: Secondary | ICD-10-CM | POA: Insufficient documentation

## 2017-10-27 DIAGNOSIS — Z720 Tobacco use: Secondary | ICD-10-CM

## 2017-10-27 DIAGNOSIS — I2510 Atherosclerotic heart disease of native coronary artery without angina pectoris: Secondary | ICD-10-CM | POA: Insufficient documentation

## 2017-10-27 DIAGNOSIS — I509 Heart failure, unspecified: Secondary | ICD-10-CM | POA: Insufficient documentation

## 2017-10-27 DIAGNOSIS — G4733 Obstructive sleep apnea (adult) (pediatric): Secondary | ICD-10-CM | POA: Insufficient documentation

## 2017-10-27 DIAGNOSIS — I5022 Chronic systolic (congestive) heart failure: Secondary | ICD-10-CM

## 2017-10-27 DIAGNOSIS — Z7982 Long term (current) use of aspirin: Secondary | ICD-10-CM | POA: Insufficient documentation

## 2017-10-27 DIAGNOSIS — I110 Hypertensive heart disease with heart failure: Secondary | ICD-10-CM | POA: Insufficient documentation

## 2017-10-27 DIAGNOSIS — I252 Old myocardial infarction: Secondary | ICD-10-CM | POA: Insufficient documentation

## 2017-10-27 DIAGNOSIS — Z79899 Other long term (current) drug therapy: Secondary | ICD-10-CM | POA: Insufficient documentation

## 2017-10-27 LAB — BASIC METABOLIC PANEL
ANION GAP: 9 (ref 5–15)
BUN: 19 mg/dL (ref 6–20)
CHLORIDE: 103 mmol/L (ref 101–111)
CO2: 27 mmol/L (ref 22–32)
Calcium: 9 mg/dL (ref 8.9–10.3)
Creatinine, Ser: 0.9 mg/dL (ref 0.61–1.24)
GFR calc Af Amer: >60 mL/min (ref >60)
GFR calc non Af Amer: >60 mL/min (ref >60)
Glucose, Bld: 97 mg/dL (ref 65–99)
POTASSIUM: 3.7 mmol/L (ref 3.5–5.1)
SODIUM: 139 mmol/L (ref 135–145)

## 2017-10-27 NOTE — Patient Instructions (Signed)
Continue weighing daily and call for an overnight weight gain of > 2 pounds or a weekly weight gain of >5 pounds. 

## 2018-01-26 ENCOUNTER — Ambulatory Visit: Payer: Self-pay | Admitting: Family

## 2018-01-26 NOTE — Progress Notes (Deleted)
Patient ID: Brian Porter, male    DOB: Dec 22, 1965, 52 y.o.   MRN: [id number]  HPI  Brian Porter is a 52 y/o male with a history of obstructive sleep apnea (without CPAP), CAD, HTN, GERD, MI, current tobacco use and chronic heart failure.   Last echo was done 10/15/15 and showed an EF of 35% along with moderate Brian/TR and an elevated PA pressure of 48 mm Hg. Had a cardiac catheterization done 10/16/15 and showed an EF of 15% along with multi-vessel disease. Recommended treatment for cardiomyopathy.   Was in the ED 08/17/17 due to heart failure exacerbation. Treated and released.   He presents today for a follow-up visit with a chief complaint of  Past Medical History:  Diagnosis Date  . CAD (coronary artery disease)   . CHF (congestive heart failure) (HCC)   . GERD (gastroesophageal reflux disease)   . Hypertension   . MI, old   . Sleep apnea    Past Surgical History:  Procedure Laterality Date  . CARDIAC CATHETERIZATION Right 10/16/2015   Procedure: Left Heart Cath and Coronary Angiography;  Surgeon: Laurier Nancy, MD;  Location: [facility] INV[facility] CV [facility];  Service: Cardiovascular;  Laterality: Right;  . CORONARY ANGIOPLASTY WITH STENT PLACEMENT  approx 2 years ago  . HERNIA REPAIR Left 11/29/2016   [facility]  . PARTIAL NEPHRECTOMY Left   . SPLENECTOMY     Family History  Problem Relation Age of Onset  . Heart failure Mother   . Hypertension Mother   . Diabetes Mellitus II Brother    Social History   Tobacco Use  . Smoking status: Current Every Day Smoker    Packs/day: 1.00    Types: Cigarettes  . Smokeless tobacco: Never Used  Substance Use Topics  . Alcohol use: No    Alcohol/week: 0.0 oz   No Known Allergies   Review of Systems  Constitutional: Positive for appetite change (decreased) and fatigue. Negative for fever.  HENT: Negative for congestion and postnasal drip.   Eyes: Negative.   Respiratory: Positive for cough and shortness of breath ("better"). Negative for chest  tightness and wheezing.   Cardiovascular: Negative for chest pain, palpitations and leg swelling.  Gastrointestinal: Negative for abdominal distention and abdominal pain.  Endocrine: Negative.   Genitourinary: Negative.   Musculoskeletal: Negative for arthralgias, back pain and neck pain.  Skin: Negative.   Allergic/Immunologic: Negative.   Neurological: Positive for light-headedness (at times). Negative for dizziness.  Hematological: Negative for adenopathy. Does not bruise/bleed easily.  Psychiatric/Behavioral: Negative for dysphoric mood, sleep disturbance (sleeping on 2 pillows) and suicidal ideas. The patient is not nervous/anxious.     Physical Exam  Constitutional: He is oriented to person, place, and time. He appears well-developed and well-nourished.  HENT:  Head: Normocephalic and atraumatic.  Neck: Normal range of motion. Neck supple. No JVD present.  Cardiovascular: Normal rate and regular rhythm.  Pulmonary/Chest: Effort normal. He has no wheezes. He has no rales.  Abdominal: Soft. He exhibits no distension. There is no tenderness.  Musculoskeletal: He exhibits no edema or tenderness.  Neurological: He is alert and oriented to person, place, and time.  Skin: Skin is warm and dry.  Psychiatric: He has a normal mood and affect. His behavior is normal. Thought content normal.  Nursing note and vitals reviewed.     Assessment & Plan:   1: Chronic heart failure with reduced ejection fraction- - NYHA class II - euvolemic in appearance  - weighing daily. Reminded  to call for an overnight weight gain of >2 pounds or a weekly weight gain of >5 pounds.  - weight down 2 pounds since he was last here - not adding salt to his food and tries to follow a 2000mg  sodium diet - BP may limit titration of metoprolol or addition of entresto -   2: HTN- - BP looks good today - he hasn't been able to get established with [facility] yet - reviewed BMP from 10/27/17 which showed  sodium 139, potassium 3.7 and GFR >60 - not [facility] in [facility] to [facility]  3: Tobacco use- - smokes 1 ppd of cigarettes - complete cessation discussed for 3 minutes with him  Patient did not bring his medications nor a list. Each medication was verbally reviewed with the patient and he was encouraged to bring the bottles to every visit to confirm accuracy of list.

## 2018-01-30 ENCOUNTER — Ambulatory Visit: Payer: Self-pay | Attending: Family | Admitting: Family

## 2018-01-30 ENCOUNTER — Encounter: Payer: Self-pay | Admitting: Family

## 2018-01-30 VITALS — BP 110/78 | HR 86 | Resp 18 | Ht 70.0 in | Wt 212.0 lb

## 2018-01-30 DIAGNOSIS — Z7982 Long term (current) use of aspirin: Secondary | ICD-10-CM | POA: Insufficient documentation

## 2018-01-30 DIAGNOSIS — K219 Gastro-esophageal reflux disease without esophagitis: Secondary | ICD-10-CM | POA: Insufficient documentation

## 2018-01-30 DIAGNOSIS — F17210 Nicotine dependence, cigarettes, uncomplicated: Secondary | ICD-10-CM | POA: Insufficient documentation

## 2018-01-30 DIAGNOSIS — I2510 Atherosclerotic heart disease of native coronary artery without angina pectoris: Secondary | ICD-10-CM | POA: Insufficient documentation

## 2018-01-30 DIAGNOSIS — Z9081 Acquired absence of spleen: Secondary | ICD-10-CM | POA: Insufficient documentation

## 2018-01-30 DIAGNOSIS — I252 Old myocardial infarction: Secondary | ICD-10-CM | POA: Insufficient documentation

## 2018-01-30 DIAGNOSIS — Z8249 Family history of ischemic heart disease and other diseases of the circulatory system: Secondary | ICD-10-CM | POA: Insufficient documentation

## 2018-01-30 DIAGNOSIS — I10 Essential (primary) hypertension: Secondary | ICD-10-CM

## 2018-01-30 DIAGNOSIS — Z905 Acquired absence of kidney: Secondary | ICD-10-CM | POA: Insufficient documentation

## 2018-01-30 DIAGNOSIS — I5022 Chronic systolic (congestive) heart failure: Secondary | ICD-10-CM | POA: Insufficient documentation

## 2018-01-30 DIAGNOSIS — Z720 Tobacco use: Secondary | ICD-10-CM

## 2018-01-30 DIAGNOSIS — Z79899 Other long term (current) drug therapy: Secondary | ICD-10-CM | POA: Insufficient documentation

## 2018-01-30 DIAGNOSIS — G4733 Obstructive sleep apnea (adult) (pediatric): Secondary | ICD-10-CM | POA: Insufficient documentation

## 2018-01-30 DIAGNOSIS — I110 Hypertensive heart disease with heart failure: Secondary | ICD-10-CM | POA: Insufficient documentation

## 2018-01-30 DIAGNOSIS — R5383 Other fatigue: Secondary | ICD-10-CM | POA: Insufficient documentation

## 2018-01-30 MED ORDER — POTASSIUM CHLORIDE ER 10 MEQ PO TBCR: 10.0000 meq | EXTENDED_RELEASE_TABLET | Freq: Every day | ORAL | 5 refills | Status: DC

## 2018-01-30 MED ORDER — FUROSEMIDE 40 MG PO TABS: 40.0000 mg | ORAL_TABLET | Freq: Every day | ORAL | 5 refills | Status: DC

## 2018-01-30 NOTE — Progress Notes (Signed)
Patient ID: Brian Porter, male    DOB: 12-24-1965, 52 y.o.   MRN: [id number]  HPI  [name] is a 52 y/o male with a history of obstructive sleep apnea (without CPAP), CAD, HTN, GERD, MI, current tobacco use and chronic heart failure.   Last echo was done 10/15/15 and showed an EF of 35% along with moderate MR/TR and an elevated PA pressure of 48 mm Hg. Had a cardiac catheterization done 10/16/15 and showed an EF of 15% along with multi-vessel disease. Recommended treatment for cardiomyopathy.   Was in the ED 08/17/17 due to heart failure exacerbation. Treated and released.   He presents today for a follow-up visit with a chief complaint of minimal fatigue upon moderate exertion. He describes this as chronic in nature having been present for several years. He has associated shortness of breath, cough, light-headedness and slight weight gain along with this. He denies any difficulty sleeping, abdominal distention, palpitations, pedal edema, chest pain or wheezing.   Past Medical History:  Diagnosis Date  . CAD (coronary artery disease)   . CHF (congestive heart failure) (HCC)   . GERD (gastroesophageal reflux disease)   . Hypertension   . MI, old   . Sleep apnea    Past Surgical History:  Procedure Laterality Date  . CARDIAC CATHETERIZATION Right 10/16/2015   Procedure: Left Heart Cath and Coronary Angiography;  Surgeon: Laurier Nancy, MD;  Location: [facility];  Service: Cardiovascular;  Laterality: Right;  . CORONARY ANGIOPLASTY WITH STENT PLACEMENT  approx 2 years ago  . HERNIA REPAIR Left 11/29/2016   [facility]  . PARTIAL NEPHRECTOMY Left   . SPLENECTOMY     Family History  Problem Relation Age of Onset  . Heart failure Mother   . Hypertension Mother   . Diabetes Mellitus II Brother    Social History   Tobacco Use  . Smoking status: Current Every Day Smoker    Packs/day: 1.00    Types: Cigarettes  . Smokeless tobacco: Never Used  Substance Use Topics  . Alcohol use:  No    Alcohol/week: 0.0 oz   No Known Allergies  Prior to Admission medications   Medication Sig Start Date End Date Taking? Authorizing Provider  aspirin 81 MG EC tablet Take 1 tablet (81 mg total) by mouth daily. 08/29/16  Yes Houston Siren, MD  furosemide (LASIX) 40 MG tablet Take 1 tablet (40 mg total) by mouth daily. 01/30/18 01/30/19 Yes Clarisa Kindred A, FNP  metoprolol succinate (TOPROL XL) 25 MG 24 hr tablet Take 1 tablet (25 mg total) by mouth daily. 08/20/17  Yes Clarisa Kindred A, FNP  potassium chloride (K-DUR) 10 MEQ tablet Take 1 tablet (10 mEq total) by mouth daily. 01/30/18  Yes Clarisa Kindred A, FNP  ranitidine (ZANTAC) 150 MG tablet Take 150 mg by mouth 2 (two) times daily as needed for heartburn.   Yes [provider]  spironolactone (ALDACTONE) 25 MG tablet Take 1 tablet (25 mg total) by mouth daily. 08/20/17  Yes Delma Freeze, FNP    Review of Systems  Constitutional: Positive for fatigue. Negative for appetite change and fever.  HENT: Negative for congestion and postnasal drip.   Eyes: Negative.   Respiratory: Positive for cough (on occasion) and shortness of breath ("better"). Negative for chest tightness and wheezing.   Cardiovascular: Negative for chest pain, palpitations and leg swelling.  Gastrointestinal: Negative for abdominal distention and abdominal pain.  Endocrine: Negative.   Genitourinary: Negative.  Musculoskeletal: Negative for arthralgias, back pain and neck pain.  Skin: Negative.   Allergic/Immunologic: Negative.   Neurological: Positive for light-headedness (at times). Negative for dizziness.  Hematological: Negative for adenopathy. Does not bruise/bleed easily.  Psychiatric/Behavioral: Negative for dysphoric mood, sleep disturbance (sleeping on 2 pillows) and suicidal ideas. The patient is not nervous/anxious.    Vitals:   01/30/18 0936  BP: 110/78  Pulse: 86  Resp: 18  SpO2: 99%  Weight: 212 lb (96.2 kg)  Height: 5\' 10"  (1.778 m)    Wt Readings from Last 3 Encounters:  01/30/18 212 lb (96.2 kg)  10/27/17 209 lb 2 oz (94.9 kg)  10/10/17 211 lb 6 oz (95.9 kg)   Lab Results  Component Value Date   CREATININE 0.90 10/27/2017   CREATININE 0.86 08/17/2017   CREATININE 1.01 06/13/2017    Physical Exam  Constitutional: He is oriented to person, place, and time. He appears well-developed and well-nourished.  HENT:  Head: Normocephalic and atraumatic.  Neck: Normal range of motion. Neck supple. No JVD present.  Cardiovascular: Normal rate and regular rhythm.  Pulmonary/Chest: Effort normal. He has no wheezes. He has no rales.  Abdominal: Soft. He exhibits no distension. There is no tenderness.  Musculoskeletal: He exhibits no edema or tenderness.  Neurological: He is alert and oriented to person, place, and time.  Skin: Skin is warm and dry.  Psychiatric: He has a normal mood and affect. His behavior is normal. Thought content normal.  Nursing note and vitals reviewed.     Assessment & Plan:   1: Chronic heart failure with reduced ejection fraction- - NYHA class II - euvolemic in appearance  - weighing daily. Reminded to call for an overnight weight gain of >2 pounds or a weekly weight gain of >5 pounds.  - weight up 3 pounds since he was last here 3 months ago - not adding salt to his food and tries to follow a 2000mg  sodium diet - BP may limit titration of metoprolol or addition of entresto - PharmD reconciled medications with the patient  2: HTN- - BP looks good today although on the low side - he hasn't called to get established with [facility] yet; [facility] to [facility] and strongly encouraged him to call them and get an appointment scheduled - reviewed BMP from 10/27/17 which showed sodium 139, potassium 3.7 and GFR >60 - not interested in going to [facility]  3: Tobacco use- - smokes 1/2 ppd of cigarettes - complete cessation  discussed for 3 minutes with him  Patient did not bring his medications nor a list. Each medication was verbally reviewed with the patient and he was encouraged to bring the bottles to every visit to confirm accuracy of list.  Return in 3 months or sooner for any questions/problems before then.

## 2018-01-30 NOTE — Patient Instructions (Addendum)
Continue weighing daily and call for an overnight weight gain of > 2 pounds or a weekly weight gain of >5 pounds.  [email or web address] [address], [place], [place] [address]  ~21.5 mi [phone]

## 2018-05-06 ENCOUNTER — Ambulatory Visit: Payer: Self-pay | Admitting: Family

## 2018-05-07 ENCOUNTER — Encounter: Payer: Self-pay | Admitting: Family

## 2018-05-07 ENCOUNTER — Ambulatory Visit: Payer: Self-pay | Attending: Family | Admitting: Family

## 2018-05-07 VITALS — BP 115/89 | HR 81 | Resp 18 | Ht 70.0 in | Wt 210.2 lb

## 2018-05-07 DIAGNOSIS — I5022 Chronic systolic (congestive) heart failure: Secondary | ICD-10-CM

## 2018-05-07 DIAGNOSIS — G4733 Obstructive sleep apnea (adult) (pediatric): Secondary | ICD-10-CM | POA: Insufficient documentation

## 2018-05-07 DIAGNOSIS — I252 Old myocardial infarction: Secondary | ICD-10-CM | POA: Insufficient documentation

## 2018-05-07 DIAGNOSIS — Z7982 Long term (current) use of aspirin: Secondary | ICD-10-CM | POA: Insufficient documentation

## 2018-05-07 DIAGNOSIS — Z955 Presence of coronary angioplasty implant and graft: Secondary | ICD-10-CM | POA: Insufficient documentation

## 2018-05-07 DIAGNOSIS — F17210 Nicotine dependence, cigarettes, uncomplicated: Secondary | ICD-10-CM | POA: Insufficient documentation

## 2018-05-07 DIAGNOSIS — Z79899 Other long term (current) drug therapy: Secondary | ICD-10-CM | POA: Insufficient documentation

## 2018-05-07 DIAGNOSIS — I110 Hypertensive heart disease with heart failure: Secondary | ICD-10-CM | POA: Insufficient documentation

## 2018-05-07 DIAGNOSIS — Z9081 Acquired absence of spleen: Secondary | ICD-10-CM | POA: Insufficient documentation

## 2018-05-07 DIAGNOSIS — I10 Essential (primary) hypertension: Secondary | ICD-10-CM

## 2018-05-07 DIAGNOSIS — Z905 Acquired absence of kidney: Secondary | ICD-10-CM | POA: Insufficient documentation

## 2018-05-07 DIAGNOSIS — Z8249 Family history of ischemic heart disease and other diseases of the circulatory system: Secondary | ICD-10-CM | POA: Insufficient documentation

## 2018-05-07 DIAGNOSIS — Z833 Family history of diabetes mellitus: Secondary | ICD-10-CM | POA: Insufficient documentation

## 2018-05-07 DIAGNOSIS — Z720 Tobacco use: Secondary | ICD-10-CM

## 2018-05-07 DIAGNOSIS — I2510 Atherosclerotic heart disease of native coronary artery without angina pectoris: Secondary | ICD-10-CM | POA: Insufficient documentation

## 2018-05-07 DIAGNOSIS — K219 Gastro-esophageal reflux disease without esophagitis: Secondary | ICD-10-CM | POA: Insufficient documentation

## 2018-05-07 MED ORDER — METOPROLOL SUCCINATE ER 25 MG PO TB24: 25.0000 mg | ORAL_TABLET | Freq: Every day | ORAL | 5 refills | Status: DC

## 2018-05-07 NOTE — Progress Notes (Signed)
Patient ID: Brian Porter, male    DOB: 04-22-1966, 52 y.o.   MRN: [id number]  HPI  Mr Zielinski is a 52 y/o male with a history of obstructive sleep apnea (without CPAP), CAD, HTN, GERD, MI, current tobacco use and chronic heart failure.   Last echo was done 10/15/15 and showed an EF of 35% along with moderate MR/TR and an elevated PA pressure of 48 mm Hg.   Had a cardiac catheterization done 10/16/15 and showed an EF of 15% along with multi-vessel disease. Recommended treatment for cardiomyopathy.   Has not been admitted or been in the ED in the last 6 months.   He presents today for a follow-up visit with a chief complaint of minimal shortness of breath upon moderate exertion. He describes this as chronic in nature having been present for several years. He has associated cough and light-headedness along with this. He denies any difficulty sleeping, abdominal distention, palpitations, pedal edema, chest pain, fatigue or weight gain.   Past Medical History:  Diagnosis Date  . CAD (coronary artery disease)   . CHF (congestive heart failure) (HCC)   . GERD (gastroesophageal reflux disease)   . Hypertension   . MI, old   . Sleep apnea    Past Surgical History:  Procedure Laterality Date  . CARDIAC CATHETERIZATION Right 10/16/2015   Procedure: Left Heart Cath and Coronary Angiography;  Surgeon: Laurier Nancy, MD;  Location: [facility] INV[facility];  Service: Cardiovascular;  Laterality: Right;  . CORONARY ANGIOPLASTY WITH STENT PLACEMENT  approx 2 years ago  . HERNIA REPAIR Left 11/29/2016   [facility]  . PARTIAL NEPHRECTOMY Left   . SPLENECTOMY     Family History  Problem Relation Age of Onset  . Heart failure Mother   . Hypertension Mother   . Diabetes Mellitus II Brother    Social History   Tobacco Use  . Smoking status: Current Every Day Smoker    Packs/day: 1.00    Types: Cigarettes  . Smokeless tobacco: Never Used  Substance Use Topics  . Alcohol use: No    Alcohol/week: 0.0  standard drinks   No Known Allergies  Prior to Admission medications   Medication Sig Start Date End Date Taking? Authorizing Provider  aspirin 81 MG EC tablet Take 1 tablet (81 mg total) by mouth daily. 08/29/16  Yes Houston Siren, MD  furosemide (LASIX) 40 MG tablet Take 1 tablet (40 mg total) by mouth daily. 01/30/18 01/30/19 Yes Clarisa Kindred A, FNP  metoprolol succinate (TOPROL XL) 25 MG 24 hr tablet Take 1 tablet (25 mg total) by mouth daily. 05/07/18  Yes Hackney, Inetta Fermo A, FNP  potassium chloride (K-DUR) 10 MEQ tablet Take 1 tablet (10 mEq total) by mouth daily. 01/30/18  Yes Clarisa Kindred A, FNP  ranitidine (ZANTAC) 150 MG tablet Take 150 mg by mouth 2 (two) times daily as needed for heartburn.   Yes [provider]  spironolactone (ALDACTONE) 25 MG tablet Take 1 tablet (25 mg total) by mouth daily. 08/20/17  Yes Delma Freeze, FNP    Review of Systems  Constitutional: Negative for appetite change and fatigue.  HENT: Negative for congestion and postnasal drip.   Eyes: Negative.   Respiratory: Positive for cough (on occasion) and shortness of breath (at times). Negative for chest tightness.   Cardiovascular: Negative for chest pain, palpitations and leg swelling.  Gastrointestinal: Negative for abdominal distention.  Endocrine: Negative.   Genitourinary: Negative.   Musculoskeletal: Negative for  arthralgias and back pain.  Skin: Negative.   Allergic/Immunologic: Negative.   Neurological: Positive for light-headedness (at times). Negative for dizziness.  Hematological: Negative for adenopathy. Does not bruise/bleed easily.  Psychiatric/Behavioral: Negative for dysphoric mood, sleep disturbance (sleeping on 2 pillows) and suicidal ideas. The patient is not nervous/anxious.    Vitals:   05/07/18 1208  BP: 115/89  Pulse: 81  Resp: 18  SpO2: 99%  Weight: 210 lb 4 oz (95.4 kg)  Height: 5\' 10"  (1.778 m)   Wt Readings from Last 3 Encounters:  05/07/18 210 lb 4 oz (95.4 kg)   01/30/18 212 lb (96.2 kg)  10/27/17 209 lb 2 oz (94.9 kg)   Lab Results  Component Value Date   CREATININE 0.90 10/27/2017   CREATININE 0.86 08/17/2017   CREATININE 1.01 06/13/2017    Physical Exam  Constitutional: He is oriented to person, place, and time. He appears well-developed and well-nourished.  HENT:  Head: Normocephalic and atraumatic.  Neck: Normal range of motion. Neck supple. No JVD present.  Cardiovascular: Normal rate and regular rhythm.  Pulmonary/Chest: Effort normal. He has wheezes in the right lower field and the left lower field. He has no rales.  Abdominal: Soft. He exhibits no distension. There is no tenderness.  Musculoskeletal: He exhibits no edema or tenderness.  Neurological: He is alert and oriented to person, place, and time.  Skin: Skin is warm and dry.  Psychiatric: He has a normal mood and affect. His behavior is normal. Thought content normal.  Nursing note and vitals reviewed.     Assessment & Plan:   1: Chronic heart failure with reduced ejection fraction- - NYHA class II - euvolemic in appearance  - weighing daily. Reminded to call for an overnight weight gain of >2 pounds or a weekly weight gain of >5 pounds.  - weight down 2 pounds since he was last here 3 months ago - need to discuss getting updated echocardiogram as last one was done in 2017 - not adding salt to his food and tries to follow a 2000mg  sodium diet - BP may limit titration of metoprolol or addition of entresto - he does not take the flu vaccine  2: HTN- - BP looks good today although on the low side - still hasn't gotten established with a PCP and he was strongly encouraged to do so - reviewed BMP from 10/27/17 which showed sodium 139, potassium 3.7 and GFR >60 - not [facility] to [facility]  3: Tobacco use- - smokes 1/2 ppd of cigarettes - complete cessation discussed for 3 minutes with him  Patient did not bring his medications nor a list.  Each medication was verbally reviewed with the patient and he was encouraged to bring the bottles to every visit to confirm accuracy of list.  Return in 6 months or sooner for any questions/problems before then.

## 2018-05-07 NOTE — Patient Instructions (Signed)
Continue weighing daily and call for an overnight weight gain of > 2 pounds or a weekly weight gain of >5 pounds. 

## 2018-07-23 ENCOUNTER — Emergency Department
Admission: EM | Admit: 2018-07-23 | Discharge: 2018-07-23 | Disposition: A | Discharge status: Home / Self Care | Payer: Self-pay | Attending: Emergency Medicine | Admitting: Emergency Medicine

## 2018-07-23 ENCOUNTER — Emergency Department: Payer: Self-pay

## 2018-07-23 ENCOUNTER — Inpatient Hospital Stay
Admission: EM | Admit: 2018-07-23 | Discharge: 2018-07-25 | DRG: 871 | Disposition: A | Discharge status: Home / Self Care | Payer: Self-pay | Attending: Internal Medicine | Admitting: Internal Medicine

## 2018-07-23 ENCOUNTER — Other Ambulatory Visit: Payer: Self-pay

## 2018-07-23 DIAGNOSIS — I5022 Chronic systolic (congestive) heart failure: Secondary | ICD-10-CM | POA: Diagnosis present

## 2018-07-23 DIAGNOSIS — I2510 Atherosclerotic heart disease of native coronary artery without angina pectoris: Secondary | ICD-10-CM | POA: Insufficient documentation

## 2018-07-23 DIAGNOSIS — R509 Fever, unspecified: Secondary | ICD-10-CM

## 2018-07-23 DIAGNOSIS — I110 Hypertensive heart disease with heart failure: Secondary | ICD-10-CM | POA: Insufficient documentation

## 2018-07-23 DIAGNOSIS — R0602 Shortness of breath: Secondary | ICD-10-CM

## 2018-07-23 DIAGNOSIS — Z7982 Long term (current) use of aspirin: Secondary | ICD-10-CM | POA: Insufficient documentation

## 2018-07-23 DIAGNOSIS — F17210 Nicotine dependence, cigarettes, uncomplicated: Secondary | ICD-10-CM | POA: Insufficient documentation

## 2018-07-23 DIAGNOSIS — Z79899 Other long term (current) drug therapy: Secondary | ICD-10-CM

## 2018-07-23 DIAGNOSIS — I252 Old myocardial infarction: Secondary | ICD-10-CM

## 2018-07-23 DIAGNOSIS — A419 Sepsis, unspecified organism: Principal | ICD-10-CM | POA: Diagnosis present

## 2018-07-23 DIAGNOSIS — J069 Acute upper respiratory infection, unspecified: Secondary | ICD-10-CM | POA: Insufficient documentation

## 2018-07-23 DIAGNOSIS — J101 Influenza due to other identified influenza virus with other respiratory manifestations: Secondary | ICD-10-CM

## 2018-07-23 DIAGNOSIS — B9789 Other viral agents as the cause of diseases classified elsewhere: Secondary | ICD-10-CM

## 2018-07-23 DIAGNOSIS — J189 Pneumonia, unspecified organism: Secondary | ICD-10-CM | POA: Diagnosis present

## 2018-07-23 DIAGNOSIS — K219 Gastro-esophageal reflux disease without esophagitis: Secondary | ICD-10-CM | POA: Diagnosis present

## 2018-07-23 DIAGNOSIS — J1000 Influenza due to other identified influenza virus with unspecified type of pneumonia: Secondary | ICD-10-CM | POA: Diagnosis present

## 2018-07-23 DIAGNOSIS — Z8249 Family history of ischemic heart disease and other diseases of the circulatory system: Secondary | ICD-10-CM

## 2018-07-23 DIAGNOSIS — J440 Chronic obstructive pulmonary disease with acute lower respiratory infection: Secondary | ICD-10-CM | POA: Diagnosis present

## 2018-07-23 DIAGNOSIS — G4730 Sleep apnea, unspecified: Secondary | ICD-10-CM | POA: Diagnosis present

## 2018-07-23 DIAGNOSIS — J441 Chronic obstructive pulmonary disease with (acute) exacerbation: Secondary | ICD-10-CM | POA: Diagnosis present

## 2018-07-23 IMAGING — CR DG CHEST 2V
1 series · 2 of 2 positions shown · non-contrast
Comparison: [DATE]

CLINICAL DATA: Cough and congestion

EXAM:
CHEST - 2 VIEW

[Series 1: dg chest 2 view · 0.14mm/px · 2 of 2 slices shown]
[im 1/2]
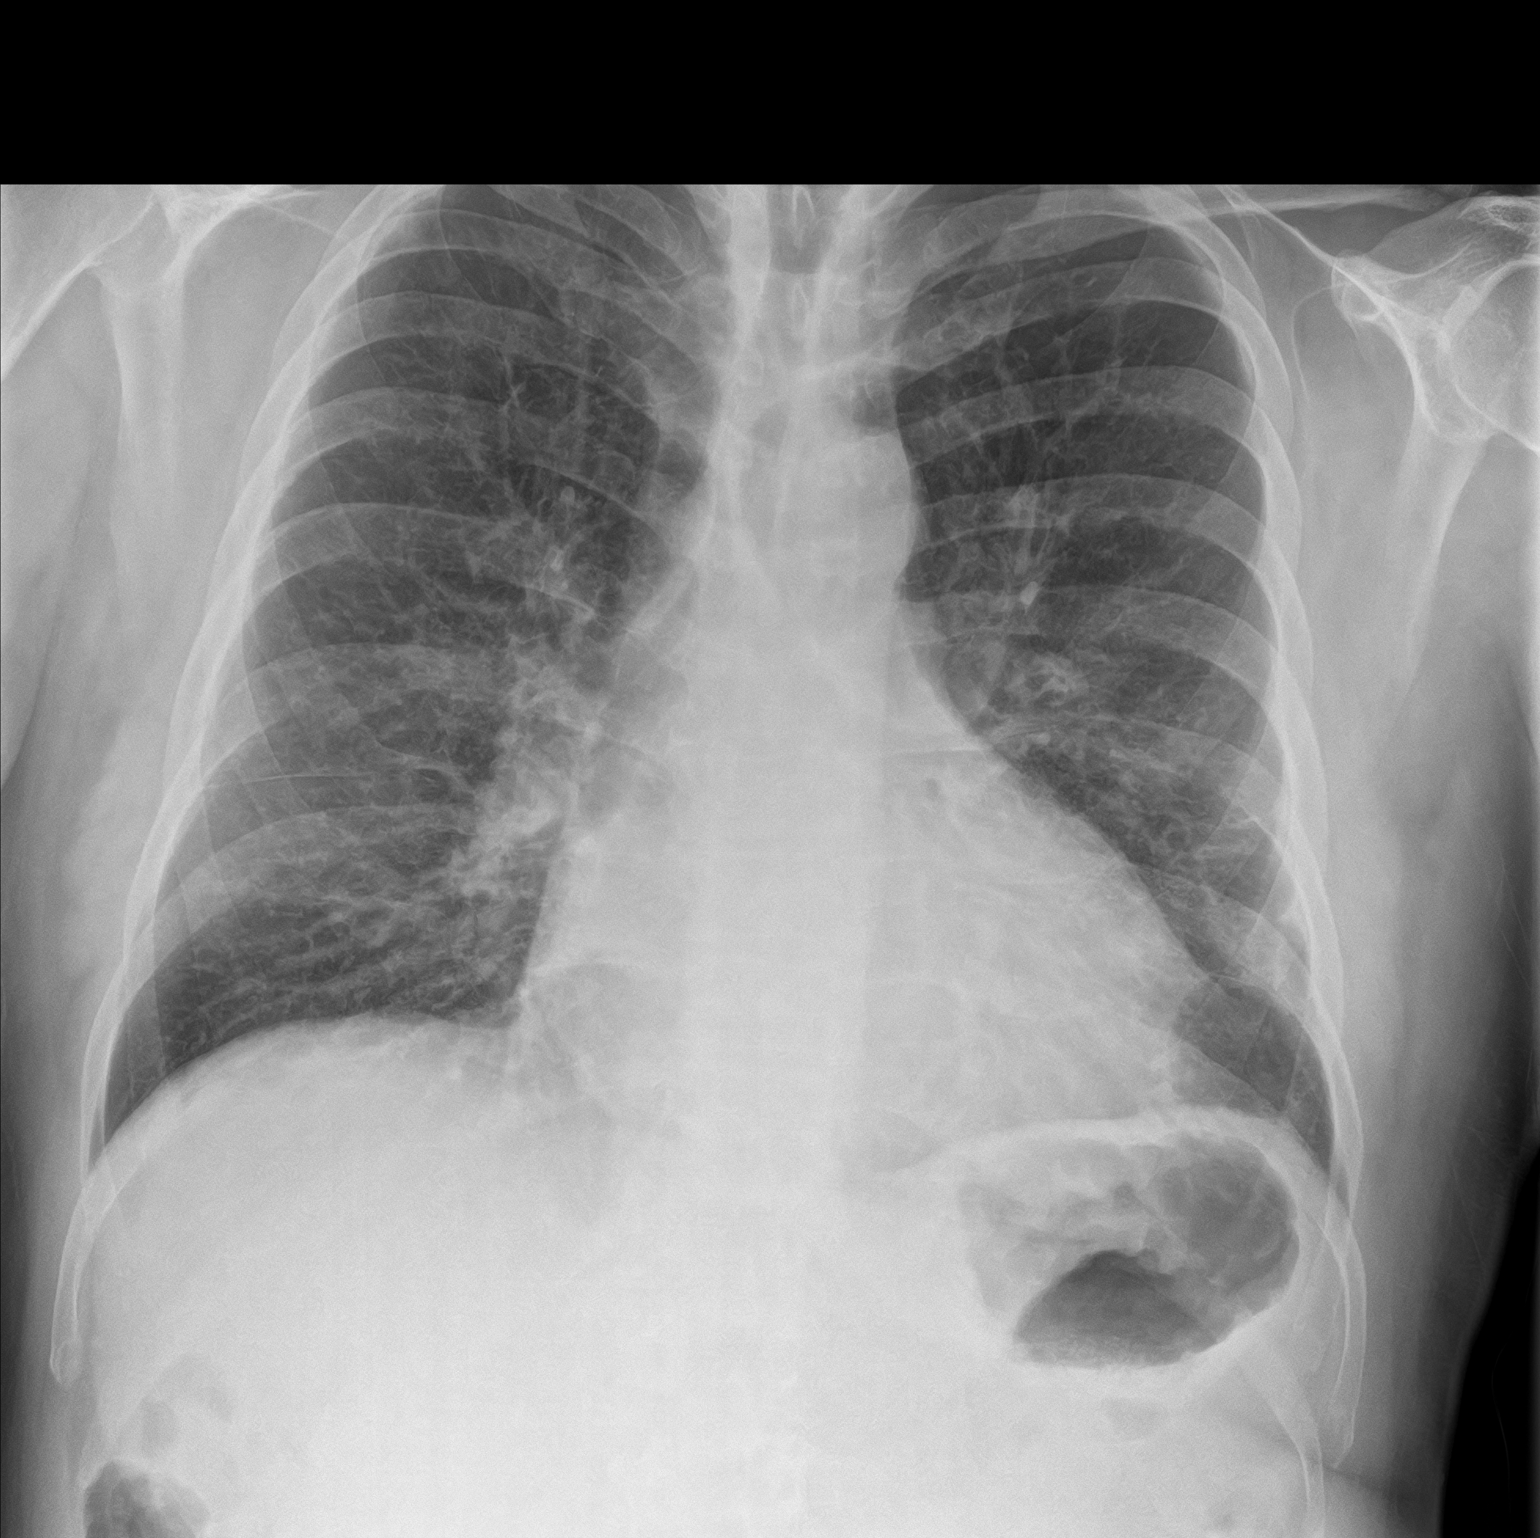
[im 2/2]
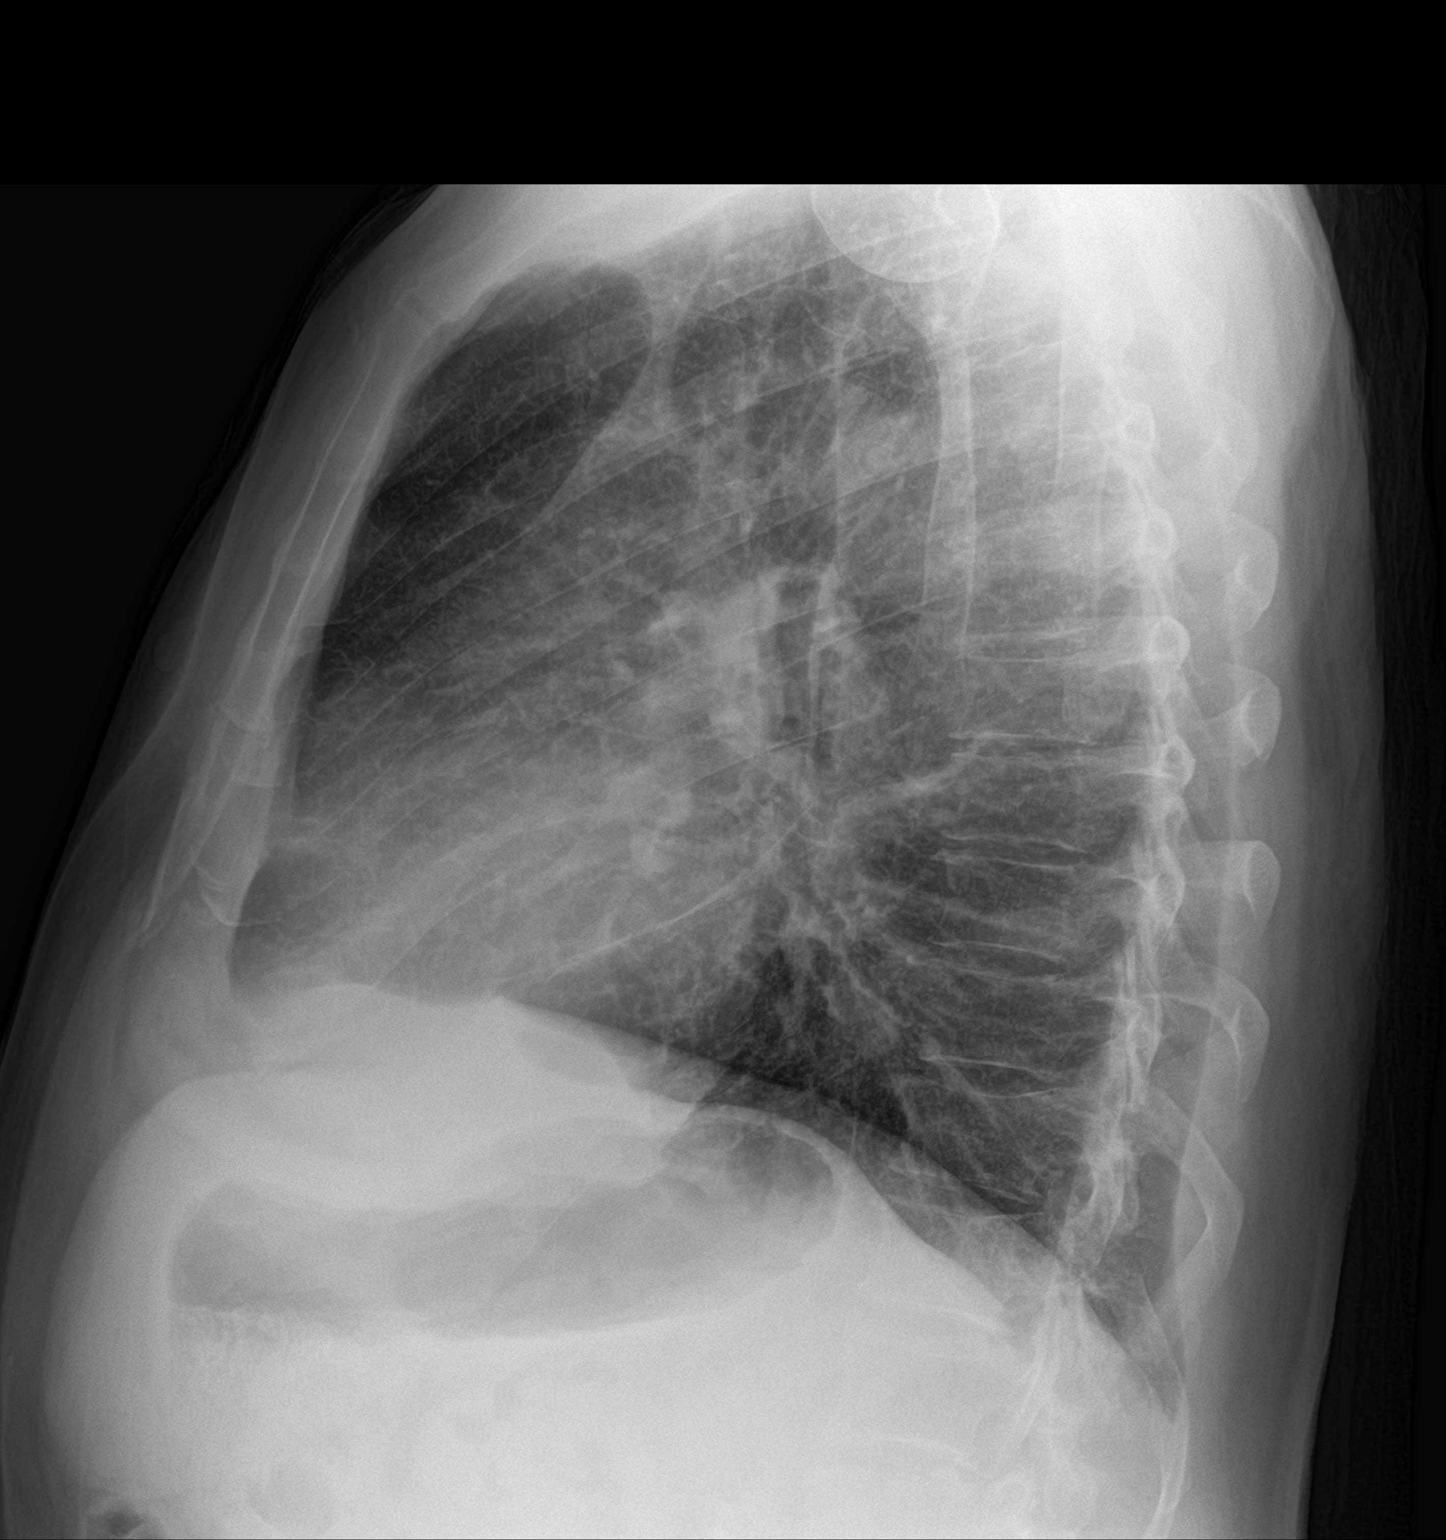

[2 of 2 positions shown; findings below may reference images not displayed]

FINDINGS: There is mild atelectatic change in the lower lung zone regions.
There is bullous disease in the apices. There is no appreciable
edema or consolidation. Heart is upper normal in size with pulmonary
vascularity normal. No adenopathy. No bone lesions.
IMPRESSION: Mild lower lobe atelectatic change. Mild bullous disease in the
apices. No edema or consolidation. Stable cardiac silhouette.

## 2018-07-23 MED ORDER — SODIUM CHLORIDE 0.9% FLUSH
3.0000 mL | Freq: Once | INTRAVENOUS | Status: AC
  Administered 2018-07-24: 3 mL via INTRAVENOUS

## 2018-07-23 MED ORDER — PSEUDOEPH-BROMPHEN-DM 30-2-10 MG/5ML PO SYRP: 5.0000 mL | ORAL_SOLUTION | Freq: Four times a day (QID) | ORAL | 0 refills | Status: DC | PRN

## 2018-07-23 NOTE — ED Notes (Signed)
See triage note. States he developed a cough for about 1 week   Denies any fever  Stats cough is prod at time but clear  Afebrile on arrival

## 2018-07-23 NOTE — ED Triage Notes (Signed)
Patient c/o SOB, chest congestion, and body aches X 3-4 days.

## 2018-07-23 NOTE — ED Triage Notes (Signed)
Pt c/o cough with chest congestion and sinus drainage x1 week

## 2018-07-23 NOTE — ED Provider Notes (Signed)
[facility] Emergency Department Provider Note   ____________________________________________   None    (approximate)  I have reviewed the triage vital signs and the nursing notes.   HISTORY  Chief Complaint Cough   HPI Brian Porter is a 53 y.o. male presents to the ED with complaint of cough and congestion.  Patient states that he has had symptoms along with sinus drainage for 1 week.  Patient continues to smoke cigarettes approximately 1 pack/day.  He denies any fever.  He has been taking Mucinex without relief of his symptoms.  He rates his pain as a 0/10.   Past Medical History:  Diagnosis Date  . CAD (coronary artery disease)   . CHF (congestive heart failure) (HCC)   . GERD (gastroesophageal reflux disease)   . Hypertension   . MI, old   . Sleep apnea     Patient Active Problem List   Diagnosis Date Noted  . Bilateral recurrent inguinal hernia without obstruction or gangrene   . Acute on chronic systolic heart failure (HCC) 08/27/2016  . HTN (hypertension) 11/02/2015  . Tobacco abuse 11/02/2015  . Hand joint pain 11/02/2015  . CHF (congestive heart failure) (HCC) 10/15/2015    Past Surgical History:  Procedure Laterality Date  . CARDIAC CATHETERIZATION Right 10/16/2015   Procedure: Left Heart Cath and Coronary Angiography;  Surgeon: Laurier Nancy, MD;  Location: [facility] INVASIVE CV LAB;  Service: Cardiovascular;  Laterality: Right;  . CORONARY ANGIOPLASTY WITH STENT PLACEMENT  approx 2 years ago  . HERNIA REPAIR Left 11/29/2016   [facility]  . PARTIAL NEPHRECTOMY Left   . SPLENECTOMY      Prior to Admission medications   Medication Sig Start Date End Date Taking? Authorizing Provider  aspirin 81 MG EC tablet Take 1 tablet (81 mg total) by mouth daily. 08/29/16   Houston Siren, MD  brompheniramine-pseudoephedrine-DM 30-2-10 MG/5ML syrup Take 5 mLs by mouth 4 (four) times daily as needed. 07/23/18   Tommi Rumps, PA-C  furosemide  (LASIX) 40 MG tablet Take 1 tablet (40 mg total) by mouth daily. 01/30/18 01/30/19  Delma Freeze, FNP  metoprolol succinate (TOPROL XL) 25 MG 24 hr tablet Take 1 tablet (25 mg total) by mouth daily. 05/07/18   Delma Freeze, FNP  potassium chloride (K-DUR) 10 MEQ tablet Take 1 tablet (10 mEq total) by mouth daily. 01/30/18   Delma Freeze, FNP  spironolactone (ALDACTONE) 25 MG tablet Take 1 tablet (25 mg total) by mouth daily. 08/20/17   Delma Freeze, FNP    Allergies Patient has no known allergies.  Family History  Problem Relation Age of Onset  . Heart failure Mother   . Hypertension Mother   . Diabetes Mellitus II Brother     Social History Social History   Tobacco Use  . Smoking status: Current Every Day Smoker    Packs/day: 1.00    Types: Cigarettes  . Smokeless tobacco: Never Used  Substance Use Topics  . Alcohol use: No    Alcohol/week: 0.0 standard drinks  . Drug use: Not Currently    Comment: percocet    Review of Systems Constitutional: No fever/chills Eyes: No visual changes. ENT: Positive nasal congestion.  Positive sinus congestion. Cardiovascular: Denies chest pain. Respiratory: Denies shortness of breath.  Positive productive cough. Gastrointestinal: No abdominal pain.  No nausea, no vomiting.  No diarrhea.  Musculoskeletal: Negative for back pain. Skin: Negative for rash. Neurological: Negative for headaches, focal weakness  or numbness. ____________________________________________   PHYSICAL EXAM:  VITAL SIGNS: ED Triage Vitals  Enc Vitals Group     BP 07/23/18 0708 (!) 143/80     Pulse Rate 07/23/18 0708 97     Resp 07/23/18 0708 15     Temp 07/23/18 0708 98 F (36.7 C)     Temp Source 07/23/18 0708 Oral     SpO2 07/23/18 0708 99 %     Weight 07/23/18 0709 215 lb (97.5 kg)     Height 07/23/18 0709 5\' 10"  (1.778 m)     Head Circumference --      Peak Flow --      Pain Score 07/23/18 0709 0     Pain Loc --      Pain Edu? --      Excl. in  GC? --    Constitutional: Alert and oriented. Well appearing and in no acute distress. Eyes: Conjunctivae are normal. PERRL. EOMI. Head: Atraumatic. Nose: Mild congestion/rhinnorhea.  TMs are dull but no erythema or injection is noted. Mouth/Throat: Mucous membranes are moist.  Oropharynx non-erythematous.  Moderate posterior drainage noted. Neck: No stridor.   Cardiovascular: Normal rate, regular rhythm. Grossly normal heart sounds.  Good peripheral circulation. Respiratory: Normal respiratory effort.  No retractions. Lungs CTAB. Gastrointestinal: Soft and nontender. No distention.  Musculoskeletal: Moves upper and lower extremities without any difficulty and patient is able to ambulate without any assistance. Neurologic:  Normal speech and language. No gross focal neurologic deficits are appreciated. No gait instability. Skin:  Skin is warm, dry and intact. No rash noted. Psychiatric: Mood and affect are normal. Speech and behavior are normal.  ____________________________________________   LABS (all labs ordered are listed, but only abnormal results are displayed)  Labs Reviewed - No data to display  RADIOLOGY  Official radiology report(s): Dg Chest 2 View  Result Date: 07/23/2018 CLINICAL DATA:  Cough and congestion EXAM: CHEST - 2 VIEW COMPARISON:  August 17, 2017 FINDINGS: There is mild atelectatic change in the lower lung zone regions. There is bullous disease in the apices. There is no appreciable edema or consolidation. Heart is upper normal in size with pulmonary vascularity normal. No adenopathy. No bone lesions. IMPRESSION: Mild lower lobe atelectatic change. Mild bullous disease in the apices. No edema or consolidation. Stable cardiac silhouette. Electronically Signed   By: Bretta BangWilliam  Woodruff III M.D.   On: 07/23/2018 08:03    ____________________________________________   PROCEDURES  Procedure(s) performed: None  Procedures  Critical Care performed:  No  ____________________________________________   INITIAL IMPRESSION / ASSESSMENT AND PLAN / ED COURSE  As part of my medical decision making, I reviewed the following data within the electronic MEDICAL RECORD NUMBER Notes from prior ED visits and [place] Controlled Substance Database  53 year old male presents to the ED with complaint of cough and congestion.  Patient has history of hypertension and CHF.  He states he has been using Mucinex over-the-counter without any relief.  Patient has had a lot of sinus congestion and sinus drainage.  He denies any fever or chills.  Patient continues to smoke 1 pack cigarettes per day.  Physical exam is unremarkable.  Chest x-ray is reported as stable.  Patient was given prescription for Bromfed-DM 4 times daily as needed for cough and congestion.  Patient is to follow-up with his PCP or can no clinic acute care if any continued problems.  ____________________________________________   FINAL CLINICAL IMPRESSION(S) / ED DIAGNOSES  Final diagnoses:  Viral URI with cough  ED Discharge Orders         Ordered    brompheniramine-pseudoephedrine-DM 30-2-10 MG/5ML syrup  4 times daily PRN     07/23/18 0810           Note:  This document was prepared using Dragon voice recognition software and may include unintentional dictation errors.    Tommi Rumps, PA-C 07/23/18 0845    Arnaldo Natal, MD 07/23/18 [phone]

## 2018-07-23 NOTE — Discharge Instructions (Addendum)
Follow-up with [facility] clinic acute care if any continued problems.  Begin taking Bromfed-DM as needed for cough and congestion.  Increase fluids.  Decrease smoking.

## 2018-07-24 ENCOUNTER — Emergency Department: Payer: Self-pay

## 2018-07-24 ENCOUNTER — Other Ambulatory Visit: Payer: Self-pay

## 2018-07-24 DIAGNOSIS — J189 Pneumonia, unspecified organism: Secondary | ICD-10-CM | POA: Diagnosis present

## 2018-07-24 LAB — INFLUENZA PANEL BY PCR (TYPE A & B)
INFLAPCR: POSITIVE — AB
INFLBPCR: NEGATIVE

## 2018-07-24 LAB — CBC WITH DIFFERENTIAL/PLATELET
Abs Immature Granulocytes: 0.04 10*3/uL (ref 0.00–0.07)
Basophils Absolute: 0.1 10*3/uL (ref 0.0–0.1)
Basophils Relative: 1 %
EOS ABS: 0.1 10*3/uL (ref 0.0–0.5)
Eosinophils Relative: 1 %
HCT: 50.1 % (ref 39.0–52.0)
Hemoglobin: 16.9 g/dL (ref 13.0–17.0)
IMMATURE GRANULOCYTES: 0 %
Lymphocytes Relative: 4 %
Lymphs Abs: 0.6 10*3/uL — ABNORMAL LOW (ref 0.7–4.0)
MCH: 31.9 pg (ref 26.0–34.0)
MCHC: 33.7 g/dL (ref 30.0–36.0)
MCV: 94.5 fL (ref 80.0–100.0)
Monocytes Absolute: 2.7 10*3/uL — ABNORMAL HIGH (ref 0.1–1.0)
Monocytes Relative: 20 %
Neutro Abs: 9.9 10*3/uL — ABNORMAL HIGH (ref 1.7–7.7)
Neutrophils Relative %: 74 %
PLATELETS: 314 10*3/uL (ref 150–400)
RBC: 5.3 MIL/uL (ref 4.22–5.81)
RDW: 13.8 % (ref 11.5–15.5)
WBC: 13.5 10*3/uL — ABNORMAL HIGH (ref 4.0–10.5)
nRBC: 0 % (ref 0.0–0.2)

## 2018-07-24 LAB — COMPREHENSIVE METABOLIC PANEL
ALK PHOS: 70 U/L (ref 38–126)
ALT: 19 U/L (ref 0–44)
ANION GAP: 7 (ref 5–15)
AST: 16 U/L (ref 15–41)
Albumin: 4.1 g/dL (ref 3.5–5.0)
BILIRUBIN TOTAL: 0.9 mg/dL (ref 0.3–1.2)
BUN: 14 mg/dL (ref 6–20)
CALCIUM: 8.8 mg/dL — AB (ref 8.9–10.3)
CO2: 25 mmol/L (ref 22–32)
Chloride: 106 mmol/L (ref 98–111)
Creatinine, Ser: 0.79 mg/dL (ref 0.61–1.24)
Glucose, Bld: 106 mg/dL — ABNORMAL HIGH (ref 70–99)
POTASSIUM: 4 mmol/L (ref 3.5–5.1)
Sodium: 138 mmol/L (ref 135–145)
TOTAL PROTEIN: 7.5 g/dL (ref 6.5–8.1)

## 2018-07-24 LAB — URINALYSIS, COMPLETE (UACMP) WITH MICROSCOPIC
Bilirubin Urine: NEGATIVE
Glucose, UA: NEGATIVE mg/dL
HGB URINE DIPSTICK: NEGATIVE
Ketones, ur: NEGATIVE mg/dL
Leukocytes, UA: NEGATIVE
NITRITE: NEGATIVE
Protein, ur: 30 mg/dL — AB
SPECIFIC GRAVITY, URINE: 1.025 (ref 1.005–1.030)
Squamous Epithelial / HPF: NONE SEEN (ref 0–5)
pH: 7 (ref 5.0–8.0)

## 2018-07-24 LAB — PROTIME-INR
INR: 1.1
Prothrombin Time: 14.1 seconds (ref 11.4–15.2)

## 2018-07-24 LAB — TROPONIN I: Troponin I: <0.03 ng/mL (ref <0.03)

## 2018-07-24 LAB — TSH: TSH: 0.722 u[IU]/mL (ref 0.350–4.500)

## 2018-07-24 LAB — BRAIN NATRIURETIC PEPTIDE: B NATRIURETIC PEPTIDE 5: 207 pg/mL — AB (ref 0.0–100.0)

## 2018-07-24 LAB — LACTIC ACID, PLASMA: Lactic Acid, Venous: 1 mmol/L (ref 0.5–1.9)

## 2018-07-24 IMAGING — CR DG CHEST 2V
2 series · 2 of 2 positions shown · non-contrast
Comparison: Chest radiograph dated [DATE]

CLINICAL DATA: 52-year-old male with shortness of breath

EXAM:
CHEST - 2 VIEW

[chest pa]
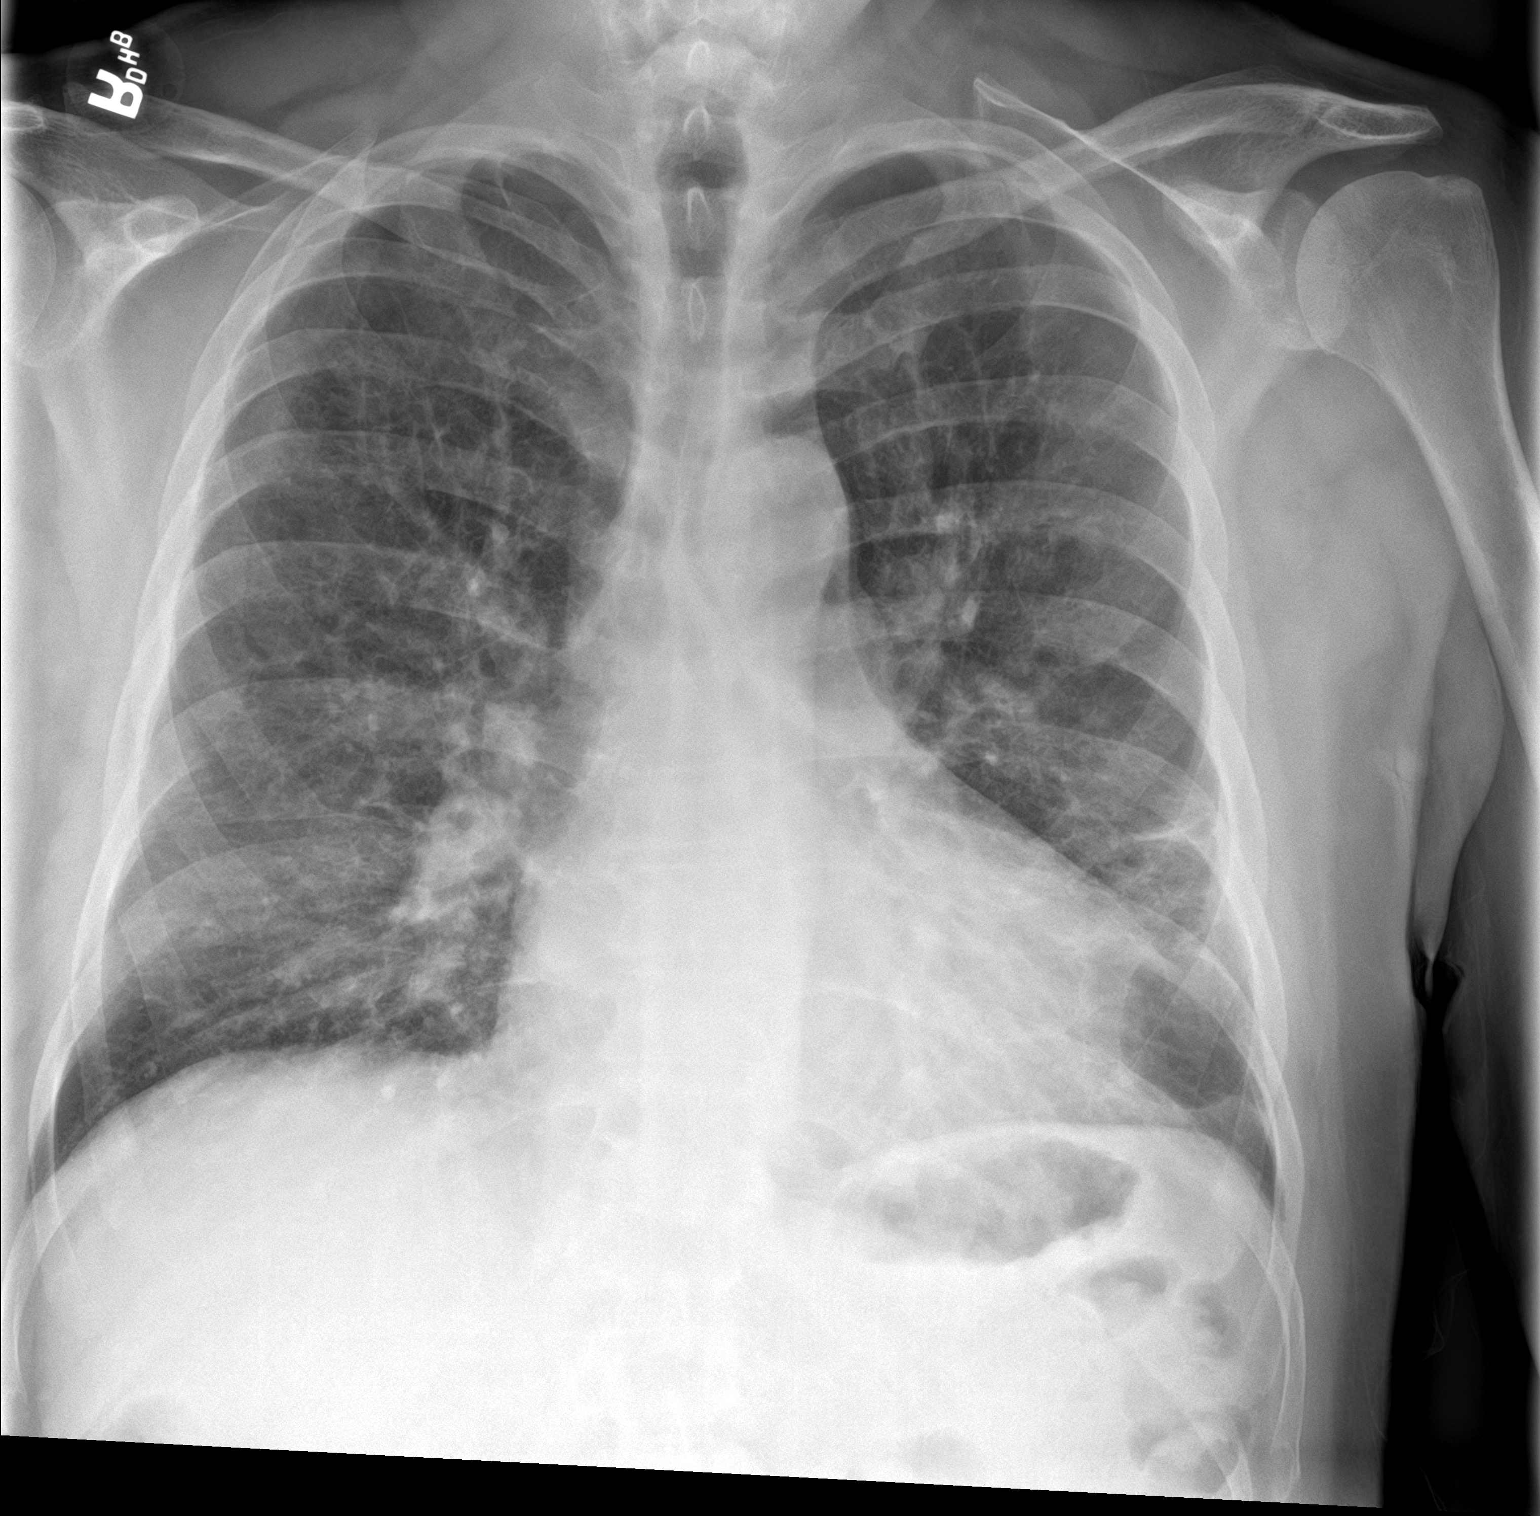

[chest lat]
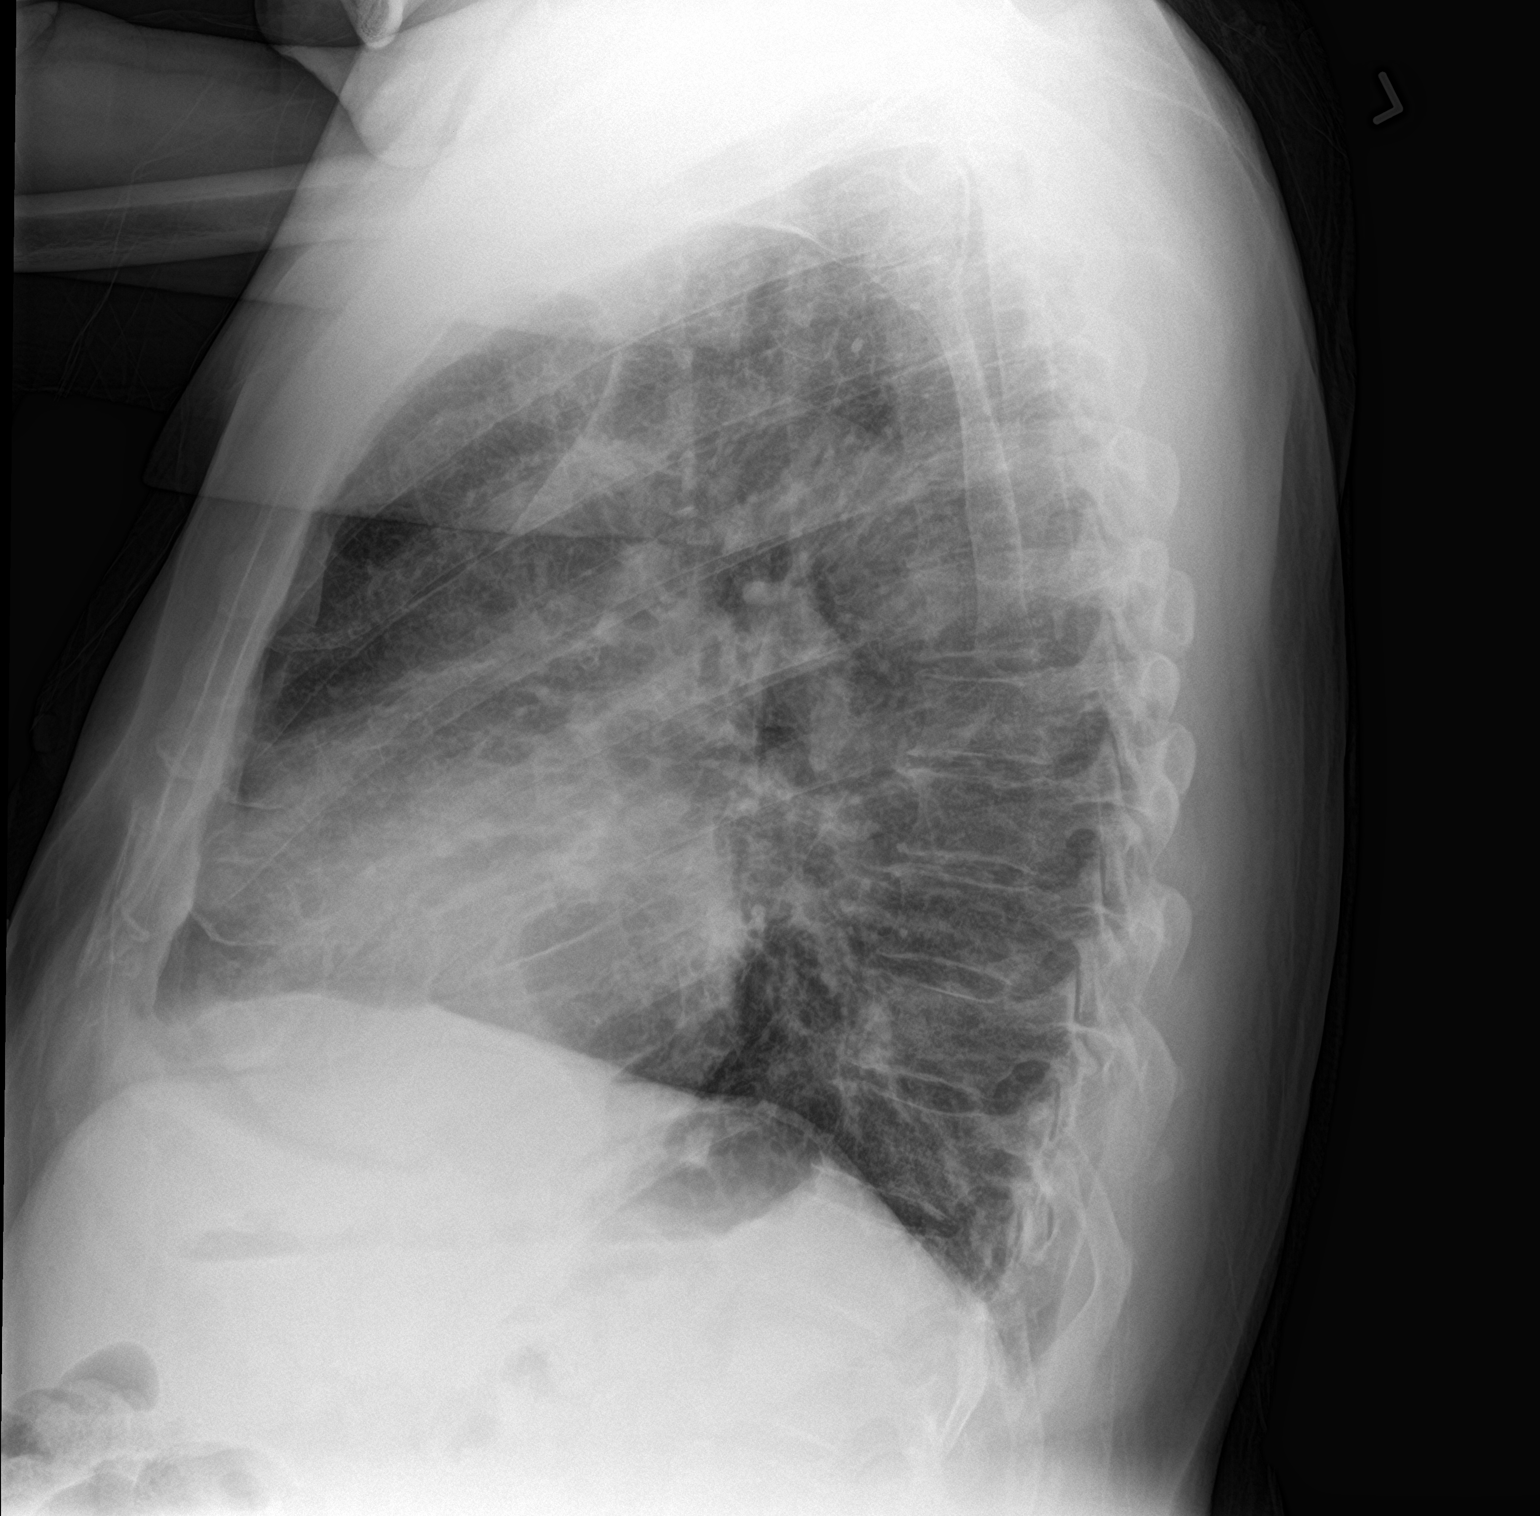

[2 of 2 positions shown; findings below may reference images not displayed]

FINDINGS: 79 there is a background of emphysema. Mid to lower lung field
streaky densities most likely atelectatic changes/scarring. No focal
consolidation, pleural effusion, or pneumothorax. Left lower lobe
subpleural bleb and biapical subpleural bulla. Stable mild
cardiomegaly. No acute osseous pathology.
IMPRESSION: Emphysema. No focal consolidation or pneumothorax.

## 2018-07-24 MED ORDER — POTASSIUM CHLORIDE CRYS ER 10 MEQ PO TBCR
10.0000 meq | EXTENDED_RELEASE_TABLET | Freq: Every day | ORAL | Status: DC
  Administered 2018-07-24 – 2018-07-25 (×2): 10 meq via ORAL
  Filled 2018-07-24 (×2): qty 1

## 2018-07-24 MED ORDER — SODIUM CHLORIDE 0.9 % IV SOLN
500.0000 mg | INTRAVENOUS | Status: DC
  Administered 2018-07-24 – 2018-07-25 (×2): 500 mg via INTRAVENOUS
  Filled 2018-07-24 (×3): qty 500

## 2018-07-24 MED ORDER — ACETAMINOPHEN 650 MG RE SUPP: 650.0000 mg | Freq: Four times a day (QID) | RECTAL | Status: DC | PRN

## 2018-07-24 MED ORDER — FUROSEMIDE 40 MG PO TABS
40.0000 mg | ORAL_TABLET | Freq: Every day | ORAL | Status: DC
  Administered 2018-07-24 – 2018-07-25 (×2): 40 mg via ORAL
  Filled 2018-07-24 (×2): qty 1

## 2018-07-24 MED ORDER — IPRATROPIUM-ALBUTEROL 0.5-2.5 (3) MG/3ML IN SOLN
3.0000 mL | Freq: Once | RESPIRATORY_TRACT | Status: AC
  Administered 2018-07-24: 3 mL via RESPIRATORY_TRACT
  Filled 2018-07-24: qty 3

## 2018-07-24 MED ORDER — SODIUM CHLORIDE 0.9 % IV BOLUS (SEPSIS): 1000.0000 mL | Freq: Once | INTRAVENOUS | Status: DC

## 2018-07-24 MED ORDER — DOCUSATE SODIUM 100 MG PO CAPS
100.0000 mg | ORAL_CAPSULE | Freq: Two times a day (BID) | ORAL | Status: DC
  Filled 2018-07-24 (×2): qty 1

## 2018-07-24 MED ORDER — ONDANSETRON HCL 4 MG/2ML IJ SOLN: 4.0000 mg | Freq: Four times a day (QID) | INTRAMUSCULAR | Status: DC | PRN

## 2018-07-24 MED ORDER — ONDANSETRON HCL 4 MG PO TABS: 4.0000 mg | ORAL_TABLET | Freq: Four times a day (QID) | ORAL | Status: DC | PRN

## 2018-07-24 MED ORDER — ACETAMINOPHEN 500 MG PO TABS
1000.0000 mg | ORAL_TABLET | Freq: Once | ORAL | Status: AC
  Administered 2018-07-24: 1000 mg via ORAL
  Filled 2018-07-24: qty 2

## 2018-07-24 MED ORDER — ASPIRIN EC 81 MG PO TBEC
81.0000 mg | DELAYED_RELEASE_TABLET | Freq: Every day | ORAL | Status: DC
  Administered 2018-07-24 – 2018-07-25 (×2): 81 mg via ORAL
  Filled 2018-07-24 (×2): qty 1

## 2018-07-24 MED ORDER — METOPROLOL SUCCINATE ER 25 MG PO TB24
25.0000 mg | ORAL_TABLET | Freq: Every day | ORAL | Status: DC
  Administered 2018-07-24 – 2018-07-25 (×2): 25 mg via ORAL
  Filled 2018-07-24 (×2): qty 1

## 2018-07-24 MED ORDER — SODIUM CHLORIDE 0.9 % IV BOLUS (SEPSIS)
1000.0000 mL | Freq: Once | INTRAVENOUS | Status: AC
  Administered 2018-07-24: 1000 mL via INTRAVENOUS

## 2018-07-24 MED ORDER — NICOTINE 21 MG/24HR TD PT24
21.0000 mg | MEDICATED_PATCH | Freq: Every day | TRANSDERMAL | Status: DC
  Administered 2018-07-24 – 2018-07-25 (×2): 21 mg via TRANSDERMAL
  Filled 2018-07-24 (×2): qty 1

## 2018-07-24 MED ORDER — ACETAMINOPHEN 325 MG PO TABS
650.0000 mg | ORAL_TABLET | Freq: Four times a day (QID) | ORAL | Status: DC | PRN
  Administered 2018-07-24 – 2018-07-25 (×5): 650 mg via ORAL
  Filled 2018-07-24 (×5): qty 2

## 2018-07-24 MED ORDER — CYCLOBENZAPRINE HCL 10 MG PO TABS
10.0000 mg | ORAL_TABLET | Freq: Every day | ORAL | Status: DC
  Filled 2018-07-24: qty 1

## 2018-07-24 MED ORDER — SODIUM CHLORIDE 0.9 % IV SOLN
INTRAVENOUS | Status: DC
  Administered 2018-07-24: 05:00:00 via INTRAVENOUS

## 2018-07-24 MED ORDER — TRAZODONE HCL 50 MG PO TABS
50.0000 mg | ORAL_TABLET | Freq: Every evening | ORAL | Status: DC | PRN
  Administered 2018-07-24: 50 mg via ORAL
  Filled 2018-07-24: qty 1

## 2018-07-24 MED ORDER — ENOXAPARIN SODIUM 40 MG/0.4ML ~~LOC~~ SOLN
40.0000 mg | SUBCUTANEOUS | Status: DC
  Administered 2018-07-24: 40 mg via SUBCUTANEOUS
  Filled 2018-07-24: qty 0.4

## 2018-07-24 MED ORDER — CYCLOBENZAPRINE HCL 10 MG PO TABS
10.0000 mg | ORAL_TABLET | Freq: Three times a day (TID) | ORAL | Status: DC | PRN
  Administered 2018-07-24 (×2): 10 mg via ORAL
  Filled 2018-07-24 (×2): qty 1

## 2018-07-24 MED ORDER — SODIUM CHLORIDE 0.9 % IV SOLN
2.0000 g | INTRAVENOUS | Status: DC
  Administered 2018-07-24 – 2018-07-25 (×2): 2 g via INTRAVENOUS
  Filled 2018-07-24 (×2): qty 20
  Filled 2018-07-24: qty 2

## 2018-07-24 MED ORDER — OSELTAMIVIR PHOSPHATE 75 MG PO CAPS
75.0000 mg | ORAL_CAPSULE | Freq: Two times a day (BID) | ORAL | Status: DC
  Administered 2018-07-24 – 2018-07-25 (×3): 75 mg via ORAL
  Filled 2018-07-24 (×3): qty 1

## 2018-07-24 MED ORDER — METHYLPREDNISOLONE SODIUM SUCC 125 MG IJ SOLR
125.0000 mg | Freq: Once | INTRAMUSCULAR | Status: AC
  Administered 2018-07-24: 125 mg via INTRAVENOUS
  Filled 2018-07-24: qty 2

## 2018-07-24 NOTE — ED Notes (Signed)
ED TO INPATIENT HANDOFF REPORT  Name/Age/Gender Brian Porter 53 y.o. male  Code Status Code Status History    Date Active Date Inactive Code Status Order ID Comments User Context   08/27/2016 1051 08/29/2016 1453 Full Code [id number]  Katha HammingKonidena, Snehalatha, MD ED   10/15/2015 0750 10/16/2015 1751 Full Code [id number]  Ihor AustinPyreddy, Pavan, MD Inpatient      Home/SNF/Other Home  Chief Complaint Body Aches; Chest Discomfort  Level of Care/Admitting Diagnosis ED Disposition    ED Disposition Condition Comment   Admit  Hospital [facility] [[id number]]  Level of Care: Med-Surg [16]  Diagnosis: CAP (community acquired pneumonia) [[id number]][[id number]]  Admitting Physician: Arnaldo NatalIAMOND, MICHAEL S [[id number]]  Attending Physician: Arnaldo NatalDIAMOND, MICHAEL S [[id number]]  Estimated length of stay: past midnight tomorrow  Certification:: I certify this patient will need inpatient services for at least 2 midnights  PT Class (Do Not Modify): Inpatient [101]  PT Acc Code (Do Not Modify): Private [1]       Medical History Past Medical History:  Diagnosis Date  . CAD (coronary artery disease)   . CHF (congestive heart failure) (HCC)   . GERD (gastroesophageal reflux disease)   . Hypertension   . MI, old   . Sleep apnea     Allergies No Known Allergies  IV Location/Drains/Wounds Patient Lines/Drains/Airways Status   Active Line/Drains/Airways    Name:   Placement date:   Placement time:   Site:   Days:   Peripheral IV 07/24/18 Left Hand   07/24/18    0057    Hand   less than 1   Peripheral IV 07/24/18 Left Antecubital   07/24/18    0048    Antecubital   less than 1          Labs/Imaging Results for orders placed or performed during the hospital encounter of 07/23/18 (from the past 48 hour(s))  Influenza panel by PCR (type A & B)     Status: Abnormal   Collection Time: 07/23/18 11:52 PM  Result Value Ref Range   Influenza A By PCR POSITIVE (A) NEGATIVE   Influenza B By PCR NEGATIVE  NEGATIVE    Comment: (NOTE) The Xpert Xpress Flu assay is intended as an aid in the diagnosis of  influenza and should not be used as a sole basis for treatment.  This  assay is FDA approved for nasopharyngeal swab specimens only. Nasal  washings and aspirates are unacceptable for Xpert Xpress Flu testing. Performed at [facility], [address] [place], [place] [address]   Troponin I - ONCE - STAT     Status: None   Collection Time: 07/24/18 12:34 AM  Result Value Ref Range   Troponin I <0.03 <0.03 ng/mL    Comment: Performed at [facility], [address] [place], [place] [address]  Comprehensive metabolic panel     Status: Abnormal   Collection Time: 07/24/18 12:34 AM  Result Value Ref Range   Sodium 138 135 - 145 mmol/L   Potassium 4.0 3.5 - 5.1 mmol/L   Chloride 106 98 - 111 mmol/L   CO2 25 22 - 32 mmol/L   Glucose, Bld 106 (H) 70 - 99 mg/dL   BUN 14 6 - 20 mg/dL   Creatinine, Ser 0.100.79 0.61 - 1.24 mg/dL   Calcium 8.8 (L) 8.9 - 10.3 mg/dL   Total Protein 7.5 6.5 - 8.1 g/dL   Albumin 4.1 3.5 - 5.0 g/dL   AST 16 15 -  41 U/L   ALT 19 0 - 44 U/L   Alkaline Phosphatase 70 38 - 126 U/L   Total Bilirubin 0.9 0.3 - 1.2 mg/dL   GFR calc non Af Amer >60 >60 mL/min   GFR calc Af Amer >60 >60 mL/min   Anion gap 7 5 - 15    Comment: Performed at [facility], [address] [place], [place] [address]  Brain natriuretic peptide     Status: Abnormal   Collection Time: 07/24/18 12:34 AM  Result Value Ref Range   B Natriuretic Peptide 207.0 (H) 0.0 - 100.0 pg/mL    Comment: Performed at [facility], [address] [place], [place] [address]  Lactic acid, plasma     Status: None   Collection Time: 07/24/18 12:34 AM  Result Value Ref Range   Lactic Acid, Venous 1.0 0.5 - 1.9 mmol/L    Comment: Performed at [facility], [address] [place], [place] [address]  CBC with Differential     Status: Abnormal   Collection Time:  07/24/18 12:34 AM  Result Value Ref Range   WBC 13.5 (H) 4.0 - 10.5 K/uL   RBC 5.30 4.22 - 5.81 MIL/uL   Hemoglobin 16.9 13.0 - 17.0 g/dL   HCT 69.650.1 29.539.0 - 28.452.0 %   MCV 94.5 80.0 - 100.0 fL   MCH 31.9 26.0 - 34.0 pg   MCHC 33.7 30.0 - 36.0 g/dL   RDW 13.213.8 44.011.5 - 10.215.5 %   Platelets 314 150 - 400 K/uL   nRBC 0.0 0.0 - 0.2 %   Neutrophils Relative % 74 %   Neutro Abs 9.9 (H) 1.7 - 7.7 K/uL   Lymphocytes Relative 4 %   Lymphs Abs 0.6 (L) 0.7 - 4.0 K/uL   Monocytes Relative 20 %   Monocytes Absolute 2.7 (H) 0.1 - 1.0 K/uL   Eosinophils Relative 1 %   Eosinophils Absolute 0.1 0.0 - 0.5 K/uL   Basophils Relative 1 %   Basophils Absolute 0.1 0.0 - 0.1 K/uL   Immature Granulocytes 0 %   Abs Immature Granulocytes 0.04 0.00 - 0.07 K/uL    Comment: Performed at [facility], [address] [place], [place] [address]  Protime-INR     Status: None   Collection Time: 07/24/18 12:34 AM  Result Value Ref Range   Prothrombin Time 14.1 11.4 - 15.2 seconds   INR 1.10     Comment: Performed at [facility], [address] [place], [place] [address]  Urinalysis, Complete w Microscopic     Status: Abnormal   Collection Time: 07/24/18  2:04 AM  Result Value Ref Range   Color, Urine YELLOW (A) YELLOW   APPearance CLEAR (A) CLEAR   Specific Gravity, Urine 1.025 1.005 - 1.030   pH 7.0 5.0 - 8.0   Glucose, UA NEGATIVE NEGATIVE mg/dL   Hgb urine dipstick NEGATIVE NEGATIVE   Bilirubin Urine NEGATIVE NEGATIVE   Ketones, ur NEGATIVE NEGATIVE mg/dL   Protein, ur 30 (A) NEGATIVE mg/dL   Nitrite NEGATIVE NEGATIVE   Leukocytes, UA NEGATIVE NEGATIVE   RBC / HPF 6-10 0 - 5 RBC/hpf   WBC, UA 0-5 0 - 5 WBC/hpf   Bacteria, UA RARE (A) NONE SEEN   Squamous Epithelial / LPF NONE SEEN 0 - 5   Mucus PRESENT     Comment: Performed at [facility], [address] [place], [place] [address]   Dg Chest 2 View  Result Date:  07/24/2018 CLINICAL DATA:  53 year old male with  shortness of breath EXAM: CHEST - 2 VIEW COMPARISON:  Chest radiograph dated 07/23/2010 FINDINGS: 52 there is a background of emphysema. Mid to lower lung field streaky densities most likely atelectatic changes/scarring. No focal consolidation, pleural effusion, or pneumothorax. Left lower lobe subpleural bleb and biapical subpleural bulla. Stable mild cardiomegaly. No acute osseous pathology. IMPRESSION: Emphysema. No focal consolidation or pneumothorax. Electronically Signed   By: Elgie Collard M.D.   On: 07/24/2018 00:14   Dg Chest 2 View  Result Date: 07/23/2018 CLINICAL DATA:  Cough and congestion EXAM: CHEST - 2 VIEW COMPARISON:  August 17, 2017 FINDINGS: There is mild atelectatic change in the lower lung zone regions. There is bullous disease in the apices. There is no appreciable edema or consolidation. Heart is upper normal in size with pulmonary vascularity normal. No adenopathy. No bone lesions. IMPRESSION: Mild lower lobe atelectatic change. Mild bullous disease in the apices. No edema or consolidation. Stable cardiac silhouette. Electronically Signed   By: Bretta Bang III M.D.   On: 07/23/2018 08:03    Pending Labs Unresulted Labs (From admission, onward)    Start     Ordered   07/24/18 0017  Urine culture  Once,   STAT     07/24/18 0017   07/24/18 0001  Culture, blood (Routine x 2)  BLOOD CULTURE X 2,   STAT     07/24/18 0001   Signed and Held  Creatinine, serum  (enoxaparin (LOVENOX)    CrCl >/= 30 ml/min)  Weekly,   R    Comments:  while on enoxaparin therapy    Signed and Held   Signed and Held  TSH  Add-on,   R     Signed and Held          Vitals/Pain Today's Vitals   07/24/18 0315 07/24/18 0330 07/24/18 0345 07/24/18 0400  BP:      Pulse: (!) 101     Resp: (!) 22 (!) 25 (!) 27 (!) 26  Temp:      TempSrc:      SpO2: 94%     Weight:      Height:      PainSc:        Isolation Precautions Droplet precaution  Medications Medications  cefTRIAXone  (ROCEPHIN) 2 g in sodium chloride 0.9 % 100 mL IVPB (0 g Intravenous Stopped 07/24/18 0153)  azithromycin (ZITHROMAX) 500 mg in sodium chloride 0.9 % 250 mL IVPB (0 mg Intravenous Stopped 07/24/18 0224)  sodium chloride flush (NS) 0.9 % injection 3 mL (3 mLs Intravenous Given 07/24/18 0209)  sodium chloride 0.9 % bolus 1,000 mL (0 mLs Intravenous Stopped 07/24/18 0224)  acetaminophen (TYLENOL) tablet 1,000 mg (1,000 mg Oral Given 07/24/18 0125)  methylPREDNISolone sodium succinate (SOLU-MEDROL) 125 mg/2 mL injection 125 mg (125 mg Intravenous Given 07/24/18 0117)  ipratropium-albuterol (DUONEB) 0.5-2.5 (3) MG/3ML nebulizer solution 3 mL (3 mLs Nebulization Given 07/24/18 0122)  ipratropium-albuterol (DUONEB) 0.5-2.5 (3) MG/3ML nebulizer solution 3 mL (3 mLs Nebulization Given 07/24/18 0207)    Mobility walks

## 2018-07-24 NOTE — Progress Notes (Signed)
CODE SEPSIS - PHARMACY COMMUNICATION  **Broad Spectrum Antibiotics should be administered within 1 hour of Sepsis diagnosis**  Time Code Sepsis Called/Page Received: 0124 0017  Antibiotics Ordered: 0124 0017  Time of 1st antibiotic administration: 0124 0119  Additional action taken by pharmacy:   If necessary, Name of Provider/Nurse Contacted:     Erich Montane ,PharmD Clinical Pharmacist  07/24/2018  1:48 AM

## 2018-07-24 NOTE — ED Notes (Signed)
Patient transported to X-ray 

## 2018-07-24 NOTE — ED Provider Notes (Signed)
[facility] Emergency Department Provider Note   ____________________________________________   First MD Initiated Contact with Patient 07/24/18 0011     (approximate)  I have reviewed the triage vital signs and the nursing notes.   HISTORY  Chief Complaint Shortness of Breath    HPI Brian Porter is a 53 y.o. male who returns to the ED from home with a chief complaint of shortness of breath.  Patient has a history of COPD and CHF who has had flulike illness for the past 3 to 4 days.  He was seen in flex yesterday with negative chest x-ray and prescribed cough medicine.  States feeling worse today, now with fever.  Last took Advil in the afternoon.  Describes nonproductive cough, body aches, chills and shortness of breath.  Denies associated headache, chest pain, abdominal pain, nausea, vomiting or diarrhea.  Denies recent travel or trauma.   Past Medical History:  Diagnosis Date  . CAD (coronary artery disease)   . CHF (congestive heart failure) (HCC)   . GERD (gastroesophageal reflux disease)   . Hypertension   . MI, old   . Sleep apnea     Patient Active Problem List   Diagnosis Date Noted  . Bilateral recurrent inguinal hernia without obstruction or gangrene   . Acute on chronic systolic heart failure (HCC) 08/27/2016  . HTN (hypertension) 11/02/2015  . Tobacco abuse 11/02/2015  . Hand joint pain 11/02/2015  . CHF (congestive heart failure) (HCC) 10/15/2015    Past Surgical History:  Procedure Laterality Date  . CARDIAC CATHETERIZATION Right 10/16/2015   Procedure: Left Heart Cath and Coronary Angiography;  Surgeon: Laurier NancyShaukat A Khan, MD;  Location: [facility] INVASIVE CV LAB;  Service: Cardiovascular;  Laterality: Right;  . CORONARY ANGIOPLASTY WITH STENT PLACEMENT  approx 2 years ago  . HERNIA REPAIR Left 11/29/2016   [facility]  . PARTIAL NEPHRECTOMY Left   . SPLENECTOMY      Prior to Admission medications   Medication Sig Start Date End Date  Taking? Authorizing Provider  aspirin 81 MG EC tablet Take 1 tablet (81 mg total) by mouth daily. 08/29/16   Houston SirenSainani, Vivek J, MD  brompheniramine-pseudoephedrine-DM 30-2-10 MG/5ML syrup Take 5 mLs by mouth 4 (four) times daily as needed. 07/23/18   Tommi RumpsSummers, Rhonda L, PA-C  furosemide (LASIX) 40 MG tablet Take 1 tablet (40 mg total) by mouth daily. 01/30/18 01/30/19  Delma FreezeHackney, Tina A, FNP  metoprolol succinate (TOPROL XL) 25 MG 24 hr tablet Take 1 tablet (25 mg total) by mouth daily. 05/07/18   Delma FreezeHackney, Tina A, FNP  potassium chloride (K-DUR) 10 MEQ tablet Take 1 tablet (10 mEq total) by mouth daily. 01/30/18   Delma FreezeHackney, Tina A, FNP  spironolactone (ALDACTONE) 25 MG tablet Take 1 tablet (25 mg total) by mouth daily. 08/20/17   Delma FreezeHackney, Tina A, FNP    Allergies Patient has no known allergies.  Family History  Problem Relation Age of Onset  . Heart failure Mother   . Hypertension Mother   . Diabetes Mellitus II Brother     Social History Social History   Tobacco Use  . Smoking status: Current Every Day Smoker    Packs/day: 1.00    Types: Cigarettes  . Smokeless tobacco: Never Used  Substance Use Topics  . Alcohol use: No    Alcohol/week: 0.0 standard drinks  . Drug use: Not Currently    Comment: percocet    Review of Systems  Constitutional: Positive for myalgias and fever/chills Eyes:  No visual changes. ENT: No sore throat. Cardiovascular: Denies chest pain. Respiratory: Positive for cough and shortness of breath. Gastrointestinal: No abdominal pain.  No nausea, no vomiting.  No diarrhea.  No constipation. Genitourinary: Negative for dysuria. Musculoskeletal: Negative for back pain. Skin: Negative for rash. Neurological: Negative for headaches, focal weakness or numbness.   ____________________________________________   PHYSICAL EXAM:  VITAL SIGNS: ED Triage Vitals  Enc Vitals Group     BP 07/23/18 2353 131/81     Pulse Rate 07/23/18 2353 (!) 115     Resp 07/23/18 2353 (!)  24     Temp 07/23/18 2353 (!) 101.4 F (38.6 C)     Temp src --      SpO2 07/23/18 2353 95 %     Weight 07/23/18 2350 210 lb (95.3 kg)     Height 07/23/18 2350 5\' 9"  (1.753 m)     Head Circumference --      Peak Flow --      Pain Score 07/23/18 2350 9     Pain Loc --      Pain Edu? --      Excl. in GC? --     Constitutional: Alert and oriented.  Uncomfortable appearing and in mild acute distress. Eyes: Conjunctivae are normal. PERRL. EOMI. Head: Atraumatic. Nose: Congestion/rhinnorhea. Mouth/Throat: Mucous membranes are moist.  Oropharynx non-erythematous. Neck: No stridor.  Supple neck without meningismus. Cardiovascular: Tachycardic rate, regular rhythm. Grossly normal heart sounds.  Good peripheral circulation. Respiratory: Increased respiratory effort.  No retractions. Lungs with wheezing and rhonchi. Gastrointestinal: Soft and nontender. No distention. No abdominal bruits. No CVA tenderness. Musculoskeletal: No lower extremity tenderness nor edema.  No joint effusions. Neurologic:  Normal speech and language. No gross focal neurologic deficits are appreciated.  Skin:  Skin is warm, dry and intact. No rash noted.  No petechiae. Psychiatric: Mood and affect are normal. Speech and behavior are normal.  ____________________________________________   LABS (all labs ordered are listed, but only abnormal results are displayed)  Labs Reviewed  COMPREHENSIVE METABOLIC PANEL - Abnormal; Notable for the following components:      Result Value   Glucose, Bld 106 (*)    Calcium 8.8 (*)    All other components within normal limits  INFLUENZA PANEL BY PCR (TYPE A & B) - Abnormal; Notable for the following components:   Influenza A By PCR POSITIVE (*)    All other components within normal limits  CBC WITH DIFFERENTIAL/PLATELET - Abnormal; Notable for the following components:   WBC 13.5 (*)    Neutro Abs 9.9 (*)    Lymphs Abs 0.6 (*)    Monocytes Absolute 2.7 (*)    All other  components within normal limits  CULTURE, BLOOD (ROUTINE X 2)  CULTURE, BLOOD (ROUTINE X 2)  URINE CULTURE  TROPONIN I  LACTIC ACID, PLASMA  PROTIME-INR  BRAIN NATRIURETIC PEPTIDE  LACTIC ACID, PLASMA  URINALYSIS, COMPLETE (UACMP) WITH MICROSCOPIC   ____________________________________________  EKG  ED ECG REPORT I, Oleg Oleson J, the attending physician, personally viewed and interpreted this ECG.   Date: 07/24/2018  EKG Time: 2352  Rate: 114  Rhythm: sinus tachycardia  Axis: RAD  Intervals:none  ST&T Change: Nonspecific  ____________________________________________  RADIOLOGY  ED MD interpretation: No pneumonia  Official radiology report(s): Dg Chest 2 View  Result Date: 07/24/2018 CLINICAL DATA:  53 year old male with shortness of breath EXAM: CHEST - 2 VIEW COMPARISON:  Chest radiograph dated 07/23/2010 FINDINGS: 9780 there is a background of emphysema.  Mid to lower lung field streaky densities most likely atelectatic changes/scarring. No focal consolidation, pleural effusion, or pneumothorax. Left lower lobe subpleural bleb and biapical subpleural bulla. Stable mild cardiomegaly. No acute osseous pathology. IMPRESSION: Emphysema. No focal consolidation or pneumothorax. Electronically Signed   By: Elgie Collard M.D.   On: 07/24/2018 00:14   Dg Chest 2 View  Result Date: 07/23/2018 CLINICAL DATA:  Cough and congestion EXAM: CHEST - 2 VIEW COMPARISON:  August 17, 2017 FINDINGS: There is mild atelectatic change in the lower lung zone regions. There is bullous disease in the apices. There is no appreciable edema or consolidation. Heart is upper normal in size with pulmonary vascularity normal. No adenopathy. No bone lesions. IMPRESSION: Mild lower lobe atelectatic change. Mild bullous disease in the apices. No edema or consolidation. Stable cardiac silhouette. Electronically Signed   By: Bretta Bang III M.D.   On: 07/23/2018 08:03     ____________________________________________   PROCEDURES  Procedure(s) performed: None  Procedures  Critical Care performed: Yes, see critical care note(s)  CRITICAL CARE Performed by: Irean Hong   Total critical care time: 30 minutes  Critical care time was exclusive of separately billable procedures and treating other patients.  Critical care was necessary to treat or prevent imminent or life-threatening deterioration.  Critical care was time spent personally by me on the following activities: development of treatment plan with patient and/or surrogate as well as nursing, discussions with consultants, evaluation of patient's response to treatment, examination of patient, obtaining history from patient or surrogate, ordering and performing treatments and interventions, ordering and review of laboratory studies, ordering and review of radiographic studies, pulse oximetry and re-evaluation of patient's condition. ____________________________________________   INITIAL IMPRESSION / ASSESSMENT AND PLAN / ED COURSE  As part of my medical decision making, I reviewed the following data within the electronic MEDICAL RECORD NUMBER Nursing notes reviewed and incorporated, Old chart reviewed, Radiograph reviewed and Notes from prior ED visits   53 year old male with COPD and CHF who returns with flulike illness and shortness of breath. Differential includes, but is not limited to, viral syndrome, bronchitis including COPD exacerbation, pneumonia, reactive airway disease including asthma, CHF including exacerbation with or without pulmonary/interstitial edema, pneumothorax, ACS, thoracic trauma, and pulmonary embolism.  Patient is febrile, tachycardic and tachypneic.  Meets criteria for ED sepsis activation.  Will administer Tylenol for fever.  Administer IV Rocephin and IV azithromycin for community-acquired pneumonia.  Administer 125 mg IV Solu-Medrol and DuoNeb.  Will  reassess.   Clinical Course as of Jul 24 145  Fri Jul 24, 2018  0018 Patient has a history of CHF.  Hold additional 2 L of IV fluids until lactate returns.   [JS]  0144 Patient's room air saturations dropped to 90% when he is asleep.  Updated him on all test results.  Lung aeration has improved but wheezing remains.  Will administer second DuoNeb.  Heart rate currently 121.  Continue IV fluids and recheck temperature.  Will discuss with hospitalist Dr. Sheryle Hail to evaluate patient in the emergency department for admission.   [JS]    Clinical Course User Index [JS] Irean Hong, MD     ____________________________________________   FINAL CLINICAL IMPRESSION(S) / ED DIAGNOSES  Final diagnoses:  Fever, unspecified fever cause  SOB (shortness of breath)  COPD exacerbation (HCC)  Influenza A     ED Discharge Orders    None       Note:  This document was prepared using Dragon voice  recognition software and may include unintentional dictation errors.    Irean Hong, MD 07/24/18 [phone]

## 2018-07-24 NOTE — H&P (Signed)
Brian Porter is an 53 y.o. male.   Chief Complaint: Shortness of breath HPI: The patient with past medical history of CHF, hypertension, CAD status post MI and sleep apnea presents to the emergency department complaining of shortness of breath.  The patient has had a nonproductive cough and sinus drainage x1 week.  Denies chest pain.  Oxygen saturations were within normal limits in the emergency department but the patient complained of labored breathing.  Chest x-ray did not show consolidation but was concerning for emphysematous changes as well as bronchitic changes.  He was found to be influenza A positive but outside of the treatment window.  Due to continued shortness of breath the emergency department staff asked the hospitalist service for admission.  Past Medical History:  Diagnosis Date  . CAD (coronary artery disease)   . CHF (congestive heart failure) (HCC)   . GERD (gastroesophageal reflux disease)   . Hypertension   . MI, old   . Sleep apnea     Past Surgical History:  Procedure Laterality Date  . CARDIAC CATHETERIZATION Right 10/16/2015   Procedure: Left Heart Cath and Coronary Angiography;  Surgeon: Laurier NancyShaukat A Khan, MD;  Location: [facility] INVASIVE CV LAB;  Service: Cardiovascular;  Laterality: Right;  . CORONARY ANGIOPLASTY WITH STENT PLACEMENT  approx 2 years ago  . HERNIA REPAIR Left 11/29/2016   [facility]  . PARTIAL NEPHRECTOMY Left   . SPLENECTOMY      Family History  Problem Relation Age of Onset  . Heart failure Mother   . Hypertension Mother   . Diabetes Mellitus II Brother    Social History:  reports that he has been smoking cigarettes. He has been smoking about 1.00 pack per day. He has never used smokeless tobacco. He reports previous drug use. He reports that he does not drink alcohol.  Allergies: No Known Allergies  Medications Prior to Admission  Medication Sig Dispense Refill  . aspirin 81 MG EC tablet Take 1 tablet (81 mg total) by mouth daily. 30 tablet 2  .  furosemide (LASIX) 40 MG tablet Take 1 tablet (40 mg total) by mouth daily. 30 tablet 5  . metoprolol succinate (TOPROL XL) 25 MG 24 hr tablet Take 1 tablet (25 mg total) by mouth daily. 30 tablet 5  . potassium chloride (K-DUR) 10 MEQ tablet Take 1 tablet (10 mEq total) by mouth daily. 30 tablet 5    Results for orders placed or performed during the hospital encounter of 07/23/18 (from the past 48 hour(s))  Influenza panel by PCR (type A & B)     Status: Abnormal   Collection Time: 07/23/18 11:52 PM  Result Value Ref Range   Influenza A By PCR POSITIVE (A) NEGATIVE   Influenza B By PCR NEGATIVE NEGATIVE    Comment: (NOTE) The Xpert Xpress Flu assay is intended as an aid in the diagnosis of  influenza and should not be used as a sole basis for treatment.  This  assay is FDA approved for nasopharyngeal swab specimens only. Nasal  washings and aspirates are unacceptable for Xpert Xpress Flu testing. Performed at [facility], [address] [place], [place] [address]   Troponin I - ONCE - STAT     Status: None   Collection Time: 07/24/18 12:34 AM  Result Value Ref Range   Troponin I <0.03 <0.03 ng/mL    Comment: Performed at [facility], [address] [place], [place] [address]  Comprehensive metabolic panel  Status: Abnormal   Collection Time: 07/24/18 12:34 AM  Result Value Ref Range   Sodium 138 135 - 145 mmol/L   Potassium 4.0 3.5 - 5.1 mmol/L   Chloride 106 98 - 111 mmol/L   CO2 25 22 - 32 mmol/L   Glucose, Bld 106 (H) 70 - 99 mg/dL   BUN 14 6 - 20 mg/dL   Creatinine, Ser 3.01 0.61 - 1.24 mg/dL   Calcium 8.8 (L) 8.9 - 10.3 mg/dL   Total Protein 7.5 6.5 - 8.1 g/dL   Albumin 4.1 3.5 - 5.0 g/dL   AST 16 15 - 41 U/L   ALT 19 0 - 44 U/L   Alkaline Phosphatase 70 38 - 126 U/L   Total Bilirubin 0.9 0.3 - 1.2 mg/dL   GFR calc non Af Amer >60 >60 mL/min   GFR calc Af Amer >60 >60 mL/min   Anion gap 7 5 - 15    Comment: Performed at [facility], [address] [place], [place] [address]  Brain natriuretic peptide     Status: Abnormal   Collection Time: 07/24/18 12:34 AM  Result Value Ref Range   B Natriuretic Peptide 207.0 (H) 0.0 - 100.0 pg/mL    Comment: Performed at [facility], [address] [place], [place] [address]  Lactic acid, plasma     Status: None   Collection Time: 07/24/18 12:34 AM  Result Value Ref Range   Lactic Acid, Venous 1.0 0.5 - 1.9 mmol/L    Comment: Performed at [facility], [address] [place], [place] [address]  CBC with Differential     Status: Abnormal   Collection Time: 07/24/18 12:34 AM  Result Value Ref Range   WBC 13.5 (H) 4.0 - 10.5 K/uL   RBC 5.30 4.22 - 5.81 MIL/uL   Hemoglobin 16.9 13.0 - 17.0 g/dL   HCT 25.4 27.0 - 62.3 %   MCV 94.5 80.0 - 100.0 fL   MCH 31.9 26.0 - 34.0 pg   MCHC 33.7 30.0 - 36.0 g/dL   RDW 76.2 83.1 - 51.7 %   Platelets 314 150 - 400 K/uL   nRBC 0.0 0.0 - 0.2 %   Neutrophils Relative % 74 %   Neutro Abs 9.9 (H) 1.7 - 7.7 K/uL   Lymphocytes Relative 4 %   Lymphs Abs 0.6 (L) 0.7 - 4.0 K/uL   Monocytes Relative 20 %   Monocytes Absolute 2.7 (H) 0.1 - 1.0 K/uL   Eosinophils Relative 1 %   Eosinophils Absolute 0.1 0.0 - 0.5 K/uL   Basophils Relative 1 %   Basophils Absolute 0.1 0.0 - 0.1 K/uL   Immature Granulocytes 0 %   Abs Immature Granulocytes 0.04 0.00 - 0.07 K/uL    Comment: Performed at [facility], [address] [place], [place] [address]  Protime-INR     Status: None   Collection Time: 07/24/18 12:34 AM  Result Value Ref Range   Prothrombin Time 14.1 11.4 - 15.2 seconds   INR 1.10     Comment: Performed at [facility], [address] [place], [place] [address]  Urinalysis, Complete w Microscopic     Status: Abnormal   Collection Time: 07/24/18  2:04 AM  Result Value Ref Range   Color, Urine YELLOW (A) YELLOW   APPearance CLEAR (A) CLEAR   Specific Gravity, Urine 1.025 1.005 - 1.030   pH 7.0  5.0 - 8.0   Glucose, UA NEGATIVE NEGATIVE mg/dL  Hgb urine dipstick NEGATIVE NEGATIVE   Bilirubin Urine NEGATIVE NEGATIVE   Ketones, ur NEGATIVE NEGATIVE mg/dL   Protein, ur 30 (A) NEGATIVE mg/dL   Nitrite NEGATIVE NEGATIVE   Leukocytes, UA NEGATIVE NEGATIVE   RBC / HPF 6-10 0 - 5 RBC/hpf   WBC, UA 0-5 0 - 5 WBC/hpf   Bacteria, UA RARE (A) NONE SEEN   Squamous Epithelial / LPF NONE SEEN 0 - 5   Mucus PRESENT     Comment: Performed at [facility], [address] [place], [place] [address]   Dg Chest 2 View  Result Date: 07/24/2018 CLINICAL DATA:  53 year old male with shortness of breath EXAM: CHEST - 2 VIEW COMPARISON:  Chest radiograph dated 07/23/2010 FINDINGS: 5080 there is a background of emphysema. Mid to lower lung field streaky densities most likely atelectatic changes/scarring. No focal consolidation, pleural effusion, or pneumothorax. Left lower lobe subpleural bleb and biapical subpleural bulla. Stable mild cardiomegaly. No acute osseous pathology. IMPRESSION: Emphysema. No focal consolidation or pneumothorax. Electronically Signed   By: Elgie CollardArash  Radparvar M.D.   On: 07/24/2018 00:14   Dg Chest 2 View  Result Date: 07/23/2018 CLINICAL DATA:  Cough and congestion EXAM: CHEST - 2 VIEW COMPARISON:  August 17, 2017 FINDINGS: There is mild atelectatic change in the lower lung zone regions. There is bullous disease in the apices. There is no appreciable edema or consolidation. Heart is upper normal in size with pulmonary vascularity normal. No adenopathy. No bone lesions. IMPRESSION: Mild lower lobe atelectatic change. Mild bullous disease in the apices. No edema or consolidation. Stable cardiac silhouette. Electronically Signed   By: Bretta BangWilliam  Woodruff III M.D.   On: 07/23/2018 08:03    Review of Systems  Constitutional: Positive for fever. Negative for chills.  HENT: Negative for sore throat and tinnitus.   Eyes: Negative for blurred vision and redness.  Respiratory:  Positive for cough and shortness of breath.   Cardiovascular: Negative for chest pain, palpitations, orthopnea and PND.  Gastrointestinal: Negative for abdominal pain, diarrhea, nausea and vomiting.  Genitourinary: Negative for dysuria, frequency and urgency.  Musculoskeletal: Negative for joint pain and myalgias.  Skin: Negative for rash.       No lesions  Neurological: Negative for speech change, focal weakness and weakness.  Endo/Heme/Allergies: Does not bruise/bleed easily.       No temperature intolerance  Psychiatric/Behavioral: Negative for depression and suicidal ideas.    Blood pressure 116/71, pulse 90, temperature 99 F (37.2 C), temperature source Oral, resp. rate 20, height 5\' 10"  (1.778 m), weight 92 kg, SpO2 93 %. Physical Exam  Vitals reviewed. Constitutional: He is oriented to person, place, and time. He appears well-developed and well-nourished. No distress.  HENT:  Head: Normocephalic and atraumatic.  Mouth/Throat: Oropharynx is clear and moist.  Eyes: Pupils are equal, round, and reactive to light. Conjunctivae and EOM are normal. No scleral icterus.  Neck: Normal range of motion. Neck supple. No JVD present. No tracheal deviation present. No thyromegaly present.  Cardiovascular: Normal rate, regular rhythm and normal heart sounds. Exam reveals no gallop and no friction rub.  No murmur heard. Respiratory: Effort normal and breath sounds normal. No respiratory distress.  GI: Soft. Bowel sounds are normal. He exhibits no distension. There is no abdominal tenderness.  Genitourinary:    Genitourinary Comments: Deferred   Musculoskeletal: Normal range of motion.        General: No edema.  Lymphadenopathy:    He has no cervical adenopathy.  Neurological:  He is alert and oriented to person, place, and time. No cranial nerve deficit.  Skin: Skin is warm and dry. No rash noted. No erythema.  Psychiatric: He has a normal mood and affect. His behavior is normal. Judgment  and thought content normal.     Assessment/Plan This is a 53 year old male admitted for pneumonia. 1.  Community-acquired pneumonia: Chest x-ray read as emphysematous change without consolidation.  However my interpretation is patient has too much bronchial congestion to dismiss.  Clinically he has a wet cough (although he is not producing mucus) and he has had some fevers.  Granted, fevers may have been associated with flu symptoms however he does have chronic lung changes it put him at greater risk of having pneumonia.  Thus I have a continued ceftriaxone and azithromycin.  Supplemental oxygen as needed. 2.  Sepsis: The patient meets criteria via fever, leukocytosis and tachypnea.  Is hemodynamically stable.  Follow blood cultures for growth and sensitivities.  If patient produces mucus obtain sputum sample. 3.  Hypertension: Controlled; continue metoprolol 4.  CHF: Chronic; systolic.  LVEF in April 2000 1715%.  Continue Lasix.  (The patient does not have an AICD) 5.  DVT prophylaxis: Lovenox 6.  GI prophylaxis: None The patient is a full code.  Time spent on admission orders and patient care approximately 45 minutes  Arnaldo Natal, MD 07/24/2018, 5:53 AM

## 2018-07-24 NOTE — Progress Notes (Signed)
Advanced care plan.  Purpose of the Encounter: CODE STATUS  Parties in Attendance: Patient  Patient's Decision Capacity: Good  Subjective/Patient's story: Presented to the emergency room for shortness of breath and cough  Objective/Medical story Patient has flu and bronchitis Needs Tamiflu and antibiotics  Goals of care determination:  Advance care directives goals of care and treatment plan discussed Patient wants everything done which includes CPR, intubation and ventilator if the need arises  CODE STATUS: Full code  Time spent discussing advanced care planning: 16 minutes

## 2018-07-24 NOTE — Progress Notes (Signed)
[facility] - [place] at [facility]   PATIENT NAME: Brian Porter    MR#:  [id number]  DATE OF BIRTH:  01-24-1966  SUBJECTIVE:  CHIEF COMPLAINT:   Chief Complaint  Patient presents with  . Shortness of Breath  Patient seen today Has cough As shortness of breath Generalized weakness No chest pain  REVIEW OF SYSTEMS:    ROS  CONSTITUTIONAL: No documented fever. Has fatigue, weakness. No weight gain, no weight loss.  EYES: No blurry or double vision.  ENT: No tinnitus. No postnasal drip. No redness of the oropharynx.  RESPIRATORY: Has cough, no wheeze, no hemoptysis. Has dyspnea.  CARDIOVASCULAR: No chest pain. No orthopnea. No palpitations. No syncope.  GASTROINTESTINAL: No nausea, no vomiting or diarrhea. No abdominal pain. No melena or hematochezia.  GENITOURINARY: No dysuria or hematuria.  ENDOCRINE: No polyuria or nocturia. No heat or cold intolerance.  HEMATOLOGY: No anemia. No bruising. No bleeding.  INTEGUMENTARY: No rashes. No lesions.  MUSCULOSKELETAL: No arthritis. No swelling. No gout.  NEUROLOGIC: No numbness, tingling, or ataxia. No seizure-type activity.  PSYCHIATRIC: No anxiety. No insomnia. No ADD.   DRUG ALLERGIES:  No Known Allergies  VITALS:  Blood pressure 114/66, pulse 87, temperature 98.7 F (37.1 C), temperature source Oral, resp. rate 20, height 5\' 10"  (1.778 m), weight 92 kg, SpO2 92 %.  PHYSICAL EXAMINATION:   Physical Exam  GENERAL:  53 y.o.-year-old patient lying in the bed with no acute distress.  EYES: Pupils equal, round, reactive to light and accommodation. No scleral icterus. Extraocular muscles intact.  HEENT: Head atraumatic, normocephalic. Oropharynx and nasopharynx clear.  NECK:  Supple, no jugular venous distention. No thyroid enlargement, no tenderness.  LUNGS: Decreased breath sounds bilaterally, rales heard in left lung. No use of accessory muscles of respiration.  CARDIOVASCULAR: S1, S2 normal. No murmurs, rubs,  or gallops.  ABDOMEN: Soft, nontender, nondistended. Bowel sounds present. No organomegaly or mass.  EXTREMITIES: No cyanosis, clubbing or edema b/l.    NEUROLOGIC: Cranial nerves II through XII are intact. No focal Motor or sensory deficits b/l.   PSYCHIATRIC: The patient is alert and oriented x 3.  SKIN: No obvious rash, lesion, or ulcer.   LABORATORY PANEL:   CBC Recent Labs  Lab 07/24/18 0034  WBC 13.5*  HGB 16.9  HCT 50.1  PLT 314   ------------------------------------------------------------------------------------------------------------------ Chemistries  Recent Labs  Lab 07/24/18 0034  NA 138  K 4.0  CL 106  CO2 25  GLUCOSE 106*  BUN 14  CREATININE 0.79  CALCIUM 8.8*  AST 16  ALT 19  ALKPHOS 70  BILITOT 0.9   ------------------------------------------------------------------------------------------------------------------  Cardiac Enzymes Recent Labs  Lab 07/24/18 0034  TROPONINI <0.03   ------------------------------------------------------------------------------------------------------------------  RADIOLOGY:  Dg Chest 2 View  Result Date: 07/24/2018 CLINICAL DATA:  53 year old male with shortness of breath EXAM: CHEST - 2 VIEW COMPARISON:  Chest radiograph dated 07/23/2010 FINDINGS: 68 there is a background of emphysema. Mid to lower lung field streaky densities most likely atelectatic changes/scarring. No focal consolidation, pleural effusion, or pneumothorax. Left lower lobe subpleural bleb and biapical subpleural bulla. Stable mild cardiomegaly. No acute osseous pathology. IMPRESSION: Emphysema. No focal consolidation or pneumothorax. Electronically Signed   By: Elgie Collard M.D.   On: 07/24/2018 00:14   Dg Chest 2 View  Result Date: 07/23/2018 CLINICAL DATA:  Cough and congestion EXAM: CHEST - 2 VIEW COMPARISON:  August 17, 2017 FINDINGS: There is mild atelectatic change in the lower lung zone regions. There is  bullous disease in the apices.  There is no appreciable edema or consolidation. Heart is upper normal in size with pulmonary vascularity normal. No adenopathy. No bone lesions. IMPRESSION: Mild lower lobe atelectatic change. Mild bullous disease in the apices. No edema or consolidation. Stable cardiac silhouette. Electronically Signed   By: Bretta Bang III M.D.   On: 07/23/2018 08:03     ASSESSMENT AND PLAN:   53 year old male patient with history of congestive heart failure, coronary disease, GERD, hypertension currently under hospitalist service for shortness of breath  -Flu syndrome Droplet precautions Oral Tamiflu Supportive care  -Community-acquired pneumonia Continue IV Rocephin and Zithromax antibiotics Follow-up cultures  -Systolic chronic congestive heart failure Discontinue IV fluids Continue Lasix for diuresis  -Systemic inflammatory response syndrome secondary to flu and pneumonia Antibiotics and supportive care Continue Tamiflu  -DVT prophylaxis subcu Lovenox daily  -Tobacco abuse Tobacco cessation counseled to the patient for 6 minutes Nicotine patch offered  All the records are reviewed and case discussed with Care Management/Social Worker. Management plans discussed with the patient, family and they are in agreement.  CODE STATUS: Full code  DVT Prophylaxis: SCDs  TOTAL TIME TAKING CARE OF THIS PATIENT: 45 minutes.   POSSIBLE D/C IN 1 to 2 DAYS, DEPENDING ON CLINICAL CONDITION.  Ihor Austin M.D on 07/24/2018 at 11:48 AM  Between 7am to 6pm - Pager - [phone]  After 6pm go to [email or web address] - password [email or web address]  [facility]  Office  [phone]  CC: Primary care physician; Patient, No Pcp Per  Note: This dictation was prepared with Dragon dictation along with smaller phrase technology. Any transcriptional errors that result from this process are unintentional.

## 2018-07-25 LAB — CBC
HCT: 50.7 % (ref 39.0–52.0)
Hemoglobin: 17.1 g/dL — ABNORMAL HIGH (ref 13.0–17.0)
MCH: 32 pg (ref 26.0–34.0)
MCHC: 33.7 g/dL (ref 30.0–36.0)
MCV: 94.9 fL (ref 80.0–100.0)
Platelets: 287 10*3/uL (ref 150–400)
RBC: 5.34 MIL/uL (ref 4.22–5.81)
RDW: 14.1 % (ref 11.5–15.5)
WBC: 10.6 10*3/uL — ABNORMAL HIGH (ref 4.0–10.5)
nRBC: 0 % (ref 0.0–0.2)

## 2018-07-25 LAB — URINE CULTURE

## 2018-07-25 LAB — GLUCOSE, CAPILLARY: Glucose-Capillary: 80 mg/dL (ref 70–99)

## 2018-07-25 MED ORDER — AMOXICILLIN-POT CLAVULANATE 875-125 MG PO TABS: 1.0000 | ORAL_TABLET | Freq: Two times a day (BID) | ORAL | 0 refills | Status: AC

## 2018-07-25 MED ORDER — AMOXICILLIN-POT CLAVULANATE 875-125 MG PO TABS: 1.0000 | ORAL_TABLET | Freq: Two times a day (BID) | ORAL | 0 refills | Status: DC

## 2018-07-25 MED ORDER — OSELTAMIVIR PHOSPHATE 75 MG PO CAPS: 75.0000 mg | ORAL_CAPSULE | Freq: Two times a day (BID) | ORAL | 0 refills | Status: DC

## 2018-07-25 MED ORDER — OSELTAMIVIR PHOSPHATE 75 MG PO CAPS: 75.0000 mg | ORAL_CAPSULE | Freq: Two times a day (BID) | ORAL | 0 refills | Status: AC

## 2018-07-25 MED ORDER — SODIUM CHLORIDE 0.9 % IV SOLN
INTRAVENOUS | Status: DC | PRN
  Administered 2018-07-25: 500 mL via INTRAVENOUS

## 2018-07-25 MED ORDER — LOPERAMIDE HCL 2 MG PO CAPS
2.0000 mg | ORAL_CAPSULE | Freq: Four times a day (QID) | ORAL | Status: DC | PRN
  Administered 2018-07-25: 2 mg via ORAL
  Filled 2018-07-25: qty 1

## 2018-07-25 NOTE — Plan of Care (Signed)
  Problem: Education: Goal: Knowledge of General Education information will improve Description Including pain rating scale, medication(s)/side effects and non-pharmacologic comfort measures Outcome: Adequate for Discharge   Problem: Health Behavior/Discharge Planning: Goal: Ability to manage health-related needs will improve Outcome: Adequate for Discharge   Problem: Clinical Measurements: Goal: Will remain free from infection Outcome: Adequate for Discharge Goal: Diagnostic test results will improve Outcome: Adequate for Discharge Goal: Respiratory complications will improve Outcome: Adequate for Discharge   Problem: Activity: Goal: Risk for activity intolerance will decrease Outcome: Adequate for Discharge   Problem: Coping: Goal: Level of anxiety will decrease Outcome: Adequate for Discharge   Problem: Safety: Goal: Ability to remain free from injury will improve Outcome: Adequate for Discharge   Problem: Activity: Goal: Ability to tolerate increased activity will improve Outcome: Adequate for Discharge   Problem: Clinical Measurements: Goal: Ability to maintain a body temperature in the normal range will improve Outcome: Adequate for Discharge   Problem: Respiratory: Goal: Ability to maintain adequate ventilation will improve Outcome: Adequate for Discharge Goal: Ability to maintain a clear airway will improve Outcome: Adequate for Discharge

## 2018-07-25 NOTE — Care Management Note (Addendum)
Case Management Note  Patient Details  Name: Brian Porter MRN: [id number] Date of Birth: May 13, 1966  Subjective/Objective:    Patient is discharging to home.  He is independent and will drive himself.  No PCP; No insurance.  Provided patient with [facility] and [facility] application and explained process. He states he will call this week to establish PCP and medication assistance.  He is current with the HF clinic. He has a scale at home and is encouraged to weigh daily. He is admitted this time with FLU and PNA.  Discharging on Augmentin and Tamilflu.  States he cannot afford right now.  This RNCM did a MATCH with [employer] on [address].                  Action/Plan:   Expected Discharge Date:  07/25/18               Expected Discharge Plan:  Home/Self Care  In-House Referral:     Discharge planning Services  CM Consult, MATCH Program, [facility], HF Clinic  Post Acute Care Choice:    Choice offered to:     DME Arranged:    DME Agency:     HH Arranged:    HH Agency:     Status of Service:  Completed, signed off  If discussed at [employer], dates discussed:    Additional Comments:  Sherren Kerns, RN 07/25/2018, 9:53 AM

## 2018-07-25 NOTE — Discharge Summary (Signed)
[facility] - [place] at [facility]   PATIENT NAME: Brian Porter    MR#:  [id number]  DATE OF BIRTH:  1965-12-12  DATE OF ADMISSION:  07/23/2018 ADMITTING PHYSICIAN: Arnaldo Natal, MD  DATE OF DISCHARGE: 07/25/2018 10:51 AM  PRIMARY CARE PHYSICIAN: Patient, No Pcp Per   ADMISSION DIAGNOSIS:  SOB (shortness of breath) [R06.02] Influenza A [J10.1] COPD exacerbation (HCC) [J44.1] Fever, unspecified fever cause [R50.9]  DISCHARGE DIAGNOSIS:  Active Problems:   CAP (community acquired pneumonia) Flu syndrome Dyspnea secondary to flu and pneumonia Chronic congestive heart failure  SECONDARY DIAGNOSIS:   Past Medical History:  Diagnosis Date  . CAD (coronary artery disease)   . CHF (congestive heart failure) (HCC)   . GERD (gastroesophageal reflux disease)   . Hypertension   . MI, old   . Sleep apnea      ADMITTING HISTORY  The patient with past medical history of CHF, hypertension, CAD status post MI and sleep apnea presents to the emergency department complaining of shortness of breath.  The patient has had a nonproductive cough and sinus drainage x1 week.  Denies chest pain.  Oxygen saturations were within normal limits in the emergency department but the patient complained of labored breathing.  Chest x-ray did not show consolidation but was concerning for emphysematous changes as well as bronchitic changes.  He was found to be influenza A positive but outside of the treatment window.  Due to continued shortness of breath the emergency department staff asked the hospitalist service for admission.   HOSPITAL COURSE:  Patient tested positive for flu.  He was started on Tamiflu therapy.  Patient also received Rocephin and Zithromax antibiotics for pneumonia.  He continued to be diuresed on Lasix for history of chronic congestive heart failure.  Fever completely resolved.  Shortness of breath improved.  Patient has been weaned off oxygen.  Blood cultures did not  reveal any growth.  Urine culture showed insignificant colonization.  Overall clinically patient doing well, will be discharged home on oral Tamiflu and antibiotics.  Follow-up with primary care physician in the clinic.  CONSULTS OBTAINED:    DRUG ALLERGIES:  No Known Allergies  DISCHARGE MEDICATIONS:   Allergies as of 07/25/2018   No Known Allergies     Medication List    TAKE these medications   amoxicillin-clavulanate 875-125 MG tablet Commonly known as:  AUGMENTIN Take 1 tablet by mouth every 12 (twelve) hours for 5 days.   aspirin 81 MG EC tablet Take 1 tablet (81 mg total) by mouth daily.   furosemide 40 MG tablet Commonly known as:  LASIX Take 1 tablet (40 mg total) by mouth daily.   metoprolol succinate 25 MG 24 hr tablet Commonly known as:  TOPROL XL Take 1 tablet (25 mg total) by mouth daily.   oseltamivir 75 MG capsule Commonly known as:  TAMIFLU Take 1 capsule (75 mg total) by mouth 2 (two) times daily for 4 days.   potassium chloride 10 MEQ tablet Commonly known as:  K-DUR Take 1 tablet (10 mEq total) by mouth daily.       Today  Patient seen today No fever No shortness of breath Tolerating diet ok Hemodynamically stable VITAL SIGNS:  Blood pressure 118/84, pulse 100, temperature 99.3 F (37.4 C), temperature source Oral, resp. rate 20, height 5\' 10"  (1.778 m), weight 95.8 kg, SpO2 97 %.  I/O:    Intake/Output Summary (Last 24 hours) at 07/25/2018 1145 Last data filed at 07/25/2018 [phone]  Gross per 24 hour  Intake 450.22 ml  Output 350 ml  Net 100.22 ml    PHYSICAL EXAMINATION:  Physical Exam  GENERAL:  53 y.o.-year-old patient lying in the bed with no acute distress.  LUNGS: Normal breath sounds bilaterally, no wheezing, rales,rhonchi or crepitation. No use of accessory muscles of respiration.  CARDIOVASCULAR: S1, S2 normal. No murmurs, rubs, or gallops.  ABDOMEN: Soft, non-tender, non-distended. Bowel sounds present. No organomegaly or  mass.  NEUROLOGIC: Moves all 4 extremities. PSYCHIATRIC: The patient is alert and oriented x 3.  SKIN: No obvious rash, lesion, or ulcer.   DATA REVIEW:   CBC Recent Labs  Lab 07/25/18 0804  WBC 10.6*  HGB 17.1*  HCT 50.7  PLT 287    Chemistries  Recent Labs  Lab 07/24/18 0034  NA 138  K 4.0  CL 106  CO2 25  GLUCOSE 106*  BUN 14  CREATININE 0.79  CALCIUM 8.8*  AST 16  ALT 19  ALKPHOS 70  BILITOT 0.9    Cardiac Enzymes Recent Labs  Lab 07/24/18 0034  TROPONINI <0.03    Microbiology Results  Results for orders placed or performed during the hospital encounter of 07/23/18  Culture, blood (Routine x 2)     Status: None (Preliminary result)   Collection Time: 07/24/18 12:34 AM  Result Value Ref Range Status   Specimen Description BLOOD BLOOD LEFT HAND  Final   Special Requests   Final    BOTTLES DRAWN AEROBIC AND ANAEROBIC Blood Culture adequate volume   Culture   Final    NO GROWTH 1 DAY Performed at [facility], [address] [place], [place] [address]    Report Status PENDING  Incomplete  Culture, blood (Routine x 2)     Status: None (Preliminary result)   Collection Time: 07/24/18 12:34 AM  Result Value Ref Range Status   Specimen Description BLOOD LEFT ASSIST CONTROL  Final   Special Requests   Final    BOTTLES DRAWN AEROBIC AND ANAEROBIC Blood Culture adequate volume   Culture   Final    NO GROWTH 1 DAY Performed at [facility], [address] [place], [place] [address]    Report Status PENDING  Incomplete  Urine culture     Status: Abnormal   Collection Time: 07/24/18  2:04 AM  Result Value Ref Range Status   Specimen Description URINE, RANDOM  Final   Special Requests   Final    NONE Performed at [facility], [address] [place], [place] [address]    Culture <10,000 COLONIES/mL INSIGNIFICANT GROWTH (A)  Final   Report Status 07/25/2018 FINAL  Final    RADIOLOGY:  Dg Chest 2 View  Result Date:  07/24/2018 CLINICAL DATA:  53 year old male with shortness of breath EXAM: CHEST - 2 VIEW COMPARISON:  Chest radiograph dated 07/23/2010 FINDINGS: 7180 there is a background of emphysema. Mid to lower lung field streaky densities most likely atelectatic changes/scarring. No focal consolidation, pleural effusion, or pneumothorax. Left lower lobe subpleural bleb and biapical subpleural bulla. Stable mild cardiomegaly. No acute osseous pathology. IMPRESSION: Emphysema. No focal consolidation or pneumothorax. Electronically Signed   By: Elgie CollardArash  Radparvar M.D.   On: 07/24/2018 00:14    Follow up with PCP in 1 week.  Management plans discussed with the patient, family and they are in agreement.  CODE STATUS: Full code    Code Status Orders  (From admission, onward)  Start     Ordered   07/24/18 0455  Full code  Continuous     07/24/18 0454        Code Status History    Date Active Date Inactive Code Status Order ID Comments User Context   08/27/2016 1051 08/29/2016 1453 Full Code [id number]  Katha Hamming, MD ED   10/15/2015 0750 10/16/2015 1751 Full Code [id number]  Ihor Austin, MD Inpatient      TOTAL TIME TAKING CARE OF THIS PATIENT ON DAY OF DISCHARGE: more than 35 minutes.   Ihor Austin M.D on 07/25/2018 at 11:45 AM  Between 7am to 6pm - Pager - [phone]  After 6pm go to [email or web address] - password [email or web address]  [facility] [place] [facility]  Office  [phone]  CC: Primary care physician; Patient, No Pcp Per  Note: This dictation was prepared with Dragon dictation along with smaller phrase technology. Any transcriptional errors that result from this process are unintentional.

## 2018-07-25 NOTE — Progress Notes (Signed)
Patient given discharge instructions with prescriptions. IV's taken out and tele monitor off. Patient verbalized understanding with no further questions or concerns.

## 2018-07-27 ENCOUNTER — Telehealth: Payer: Self-pay

## 2018-07-27 NOTE — Telephone Encounter (Signed)
EMMI Follow-up: Patient was listed on the Future Patient list and received an automated call so just checking to see why we had called.  I talked with Brian Porter and explained our process of two automated calls post discharge and checking to see if he had Rx's filled, if he was aware of follow-up appointments and how he was doing.  Said he had Rx's filled and was doing much better.  Said he received great care while here.  He is aware of follow-up appointments so no needs noted for today.

## 2018-07-29 LAB — CULTURE, BLOOD (ROUTINE X 2)
Culture: NO GROWTH
Culture: NO GROWTH
Special Requests: ADEQUATE
Special Requests: ADEQUATE

## 2018-09-03 ENCOUNTER — Other Ambulatory Visit: Payer: Self-pay | Admitting: Family

## 2018-09-03 MED ORDER — FUROSEMIDE 40 MG PO TABS: 40.0000 mg | ORAL_TABLET | Freq: Every day | ORAL | 5 refills | Status: DC

## 2018-10-21 ENCOUNTER — Other Ambulatory Visit: Payer: Self-pay | Admitting: Family

## 2018-10-30 ENCOUNTER — Telehealth: Payer: Self-pay

## 2018-10-30 NOTE — Telephone Encounter (Signed)
TELEPHONE CALL NOTE  [facility] has been deemed a candidate for a follow-up tele-health visit to limit community exposure during the Covid-19 pandemic. I spoke with the patient via phone to ensure availability of phone/video source, confirm preferred email & phone number, discuss instructions and expectations, and review consent.   I reminded Alvey Chukwu [facility] to be prepared with any vital sign and/or heart rhythm information that could potentially be obtained via home monitoring, at the time of his visit.  Finally, I reminded [facility] to expect an e-mail containing a link for their video-based visit approximately 15 minutes before his visit, or alternatively, a phone call at the time of his visit if his visit is planned to be a phone encounter.  Did the patient verbally consent to treatment as below? YES  Vinnie Level, CMA 10/30/2018 8:47 AM  CONSENT FOR TELE-HEALTH VISIT - PLEASE REVIEW  I hereby voluntarily request, consent and authorize [facility] and its employed or contracted physicians, physician assistants, nurse practitioners or other licensed health care professionals (the Practitioner), to provide me with telemedicine health care services (the "Services") as deemed necessary by the treating Practitioner. I acknowledge and consent to receive the Services by the Practitioner via telemedicine. I understand that the telemedicine visit will involve communicating with the Practitioner through telephonic communication technology and the disclosure of certain medical information by electronic transmission. I acknowledge that I have been given the opportunity to request an in-person assessment or other available alternative prior to the telemedicine visit and am voluntarily participating in the telemedicine visit.  I understand that I have the right to withhold or withdraw my consent to the use of telemedicine in the course of my care at any time, without affecting my right  to future care or treatment, and that the Practitioner or I may terminate the telemedicine visit at any time. I understand that I have the right to inspect all information obtained and/or recorded in the course of the telemedicine visit and may receive copies of available information for a reasonable fee.  I understand that some of the potential risks of receiving the Services via telemedicine include:  Marland Kitchen Delay or interruption in medical evaluation due to technological equipment failure or disruption; . Information transmitted may not be sufficient (e.g. poor resolution of images) to allow for appropriate medical decision making by the Practitioner; and/or  . In rare instances, security protocols could fail, causing a breach of personal health information.  Furthermore, I acknowledge that it is my responsibility to provide information about my medical history, conditions and care that is complete and accurate to the best of my ability. I acknowledge that Practitioner's advice, recommendations, and/or decision may be based on factors not within their control, such as incomplete or inaccurate data provided by me or lack of visual representation. I understand that the practice of medicine is not an exact science and that Practitioner makes no warranties or guarantees regarding treatment outcomes. I acknowledge that I will receive a copy of this consent concurrently upon execution via email to the email address I last provided but may also request a printed copy by calling the office of [facility].    I understand that my insurance may be billed for this visit.   I have read or had this consent read to me. . I understand the contents of this consent, which adequately explains the benefits and risks of the Services being provided via telemedicine.  Marland Kitchen  I have been provided ample opportunity to ask questions regarding this consent and the Services and have had my questions answered to my  satisfaction. . I give my informed consent for the services to be provided through the use of telemedicine in my medical care  By participating in this telemedicine visit I agree to the above.

## 2018-10-30 NOTE — Telephone Encounter (Signed)
   TELEPHONE CALL NOTE  This patient has been deemed a candidate for follow-up tele-health visit to limit community exposure during the Covid-19 pandemic. I spoke with the patient via phone to discuss instructions. The patient was advised to review the section on consent for treatment as well. The patient will receive a phone call 2-3 days prior to their E-Visit at which time consent will be verbally confirmed. A Virtual Office Visit appointment type has been scheduled for 11/02/2018 with [facility].  Vinnie Level, CMA 10/30/2018 8:46 AM

## 2018-11-02 ENCOUNTER — Other Ambulatory Visit: Payer: Self-pay

## 2018-11-02 ENCOUNTER — Ambulatory Visit: Payer: Self-pay | Attending: Family | Admitting: Family

## 2018-11-02 ENCOUNTER — Encounter: Payer: Self-pay | Admitting: Family

## 2018-11-02 VITALS — BP 135/78 | Wt 218.0 lb

## 2018-11-02 DIAGNOSIS — I10 Essential (primary) hypertension: Secondary | ICD-10-CM

## 2018-11-02 DIAGNOSIS — I5022 Chronic systolic (congestive) heart failure: Secondary | ICD-10-CM

## 2018-11-02 DIAGNOSIS — Z720 Tobacco use: Secondary | ICD-10-CM

## 2018-11-02 NOTE — Patient Instructions (Signed)
Continue weighing daily and call for an overnight weight gain of > 2 pounds or a weekly weight gain of >5 pounds. 

## 2018-11-02 NOTE — Progress Notes (Signed)
Virtual Visit via Telephone Note    Evaluation Performed:  Follow-up visit  This visit type was conducted due to national recommendations for restrictions regarding the COVID-19 Pandemic (e.g. social distancing).  This format is felt to be most appropriate for this patient at this time.  All issues noted in this document were discussed and addressed.  No physical exam was performed (except for noted visual exam findings with Video Visits).  Please refer to the patient's chart (MyChart message for video visits and phone note for telephone visits) for the patient's consent to telehealth for [facility]  Date:  11/02/2018   ID:  Brian Porter, DOB 06/05/1966, MRN [id number]  Patient Location:  [address] [place] [address]   Provider location:   [facility] [address] [place], [place] [address]  PCP:  Patient, No Pcp Per  Cardiologist: none Electrophysiologist:  None   Chief Complaint:  Shortness of breath  History of Present Illness:    Brian Porter is a 53 y.o. male who presents via audio/video conferencing for a telehealth visit today.  Patient verified DOB and address.  The patient does not have symptoms concerning for COVID-19 infection (fever, chills, cough, or new SHORTNESS OF BREATH).   Patient reports minimal shortness of breath upon moderate exertion. He describes this as chronic in nature having been present for several years. He has associated fatigue, head congestion, rhinorrhea, cough and dizziness along with this. He denies any swelling in his legs/ abdomen, palpitations, chest pain, difficulty sleeping or weight gain.   Prior CV studies:   The following studies were reviewed today:  Echo report from 10/15/15 reviewed and showed an EF of 35% along with moderate MR/ TR and a PA pressure of 48 mmHg.  Past Medical History:  Diagnosis Date  . CAD (coronary artery disease)   . CHF (congestive heart failure) (HCC)   . GERD  (gastroesophageal reflux disease)   . Hypertension   . MI, old   . Sleep apnea    Past Surgical History:  Procedure Laterality Date  . CARDIAC CATHETERIZATION Right 10/16/2015   Procedure: Left Heart Cath and Coronary Angiography;  Surgeon: Laurier Nancy, MD;  Location: [facility] INVASIVE CV LAB;  Service: Cardiovascular;  Laterality: Right;  . CORONARY ANGIOPLASTY WITH STENT PLACEMENT  approx 2 years ago  . HERNIA REPAIR Left 11/29/2016   [facility]  . PARTIAL NEPHRECTOMY Left   . SPLENECTOMY       Current Meds  Medication Sig  . aspirin 81 MG EC tablet Take 1 tablet (81 mg total) by mouth daily.  . furosemide (LASIX) 40 MG tablet Take 1 tablet (40 mg total) by mouth daily.  . metoprolol succinate (TOPROL XL) 25 MG 24 hr tablet Take 1 tablet (25 mg total) by mouth daily.  . potassium chloride (K-DUR) 10 MEQ tablet Take 1 tablet by mouth once daily     Allergies:   Patient has no known allergies.   Social History   Tobacco Use  . Smoking status: Current Every Day Smoker    Packs/day: 1.00    Types: Cigarettes  . Smokeless tobacco: Never Used  Substance Use Topics  . Alcohol use: No    Alcohol/week: 0.0 standard drinks  . Drug use: Not Currently    Comment: percocet     Family Hx: The patient's family history includes Diabetes Mellitus II in his brother; Heart failure in his mother; Hypertension in his mother.  ROS:   Please see the history of present illness.     All other systems reviewed and are negative.   Labs/Other Tests and Data Reviewed:    Recent Labs: 07/24/2018: ALT 19; B Natriuretic Peptide 207.0; BUN 14; Creatinine, Ser 0.79; Potassium 4.0; Sodium 138; TSH 0.722 07/25/2018: Hemoglobin 17.1; Platelets 287   Recent Lipid Panel No results found for: CHOL, TRIG, HDL, CHOLHDL, LDLCALC, LDLDIRECT  Wt Readings from Last 3 Encounters:  11/02/18 218 lb (98.9 kg)  07/25/18 211 lb 4.8 oz (95.8 kg)  07/23/18 215 lb (97.5 kg)     Exam:    Vital Signs:  BP 135/78  Comment: self-reported  Wt 218 lb (98.9 kg) Comment: self-reported  BMI 31.28 kg/m    Well nourished, well developed male in no  acute distress.   ASSESSMENT & PLAN:    1. Chronic heart failure with reduced ejection fraction- - NYHA class II - euvolemic based on patient's description of symptoms - weighing daily and he says that his weight ranges from 215-218 pounds. Reminded to call for an overnight weight gain of >2 pounds or a weekly weight gain of >5 pounds.  - once COVID -19 restrictions are lifted, need to schedule patient for updated echocardiogram as would like to add entresto to his regimen - not adding salt to his food and tries to follow a 2000mg  sodium diet - BNP on 07/24/2018 was 207.0  2: HTN- - self-reported BP today was good (135/78) - still hasn't gotten established with a PCP and he was strongly encouraged to do so - reviewed BMP from 07/24/2018 which showed sodium 138, potassium 4.0, creatinine 0.79 and GFR >60 - not interested in [facility]  3: Tobacco use- - smokes 1 ppd of cigarettes - complete cessation discussed for 3 minutes with him   COVID-19 Education: The signs and symptoms of COVID-19 were discussed with the patient and how to seek care for testing (follow up with PCP or arrange E-visit).  The importance of social distancing was discussed today.  Patient Risk:   After full review of this patients clinical status, I feel that they are at least moderate risk at this time.  Time:   Today, I have spent 7 minutes with the patient with telehealth technology discussing medications, weight and symptoms to report.     Medication Adjustments/Labs and Tests Ordered: Current medicines are reviewed at length with the patient today.  Concerns regarding medicines are outlined above.   Tests Ordered: No orders of the defined types were placed in this encounter.  Medication Changes: No orders of the defined types were placed in this  encounter.   Disposition:  Follow-up in 6 months or sooner for any questions/problems before then.   Signed, Delma Freezeina A Hackney, FNP  11/02/2018 12:40 PM    [facility]

## 2018-11-20 ENCOUNTER — Other Ambulatory Visit: Payer: Self-pay | Admitting: Family

## 2018-11-20 MED ORDER — METOPROLOL SUCCINATE ER 25 MG PO TB24: 25.0000 mg | ORAL_TABLET | Freq: Every day | ORAL | 5 refills | Status: DC

## 2019-03-07 ENCOUNTER — Other Ambulatory Visit: Payer: Self-pay | Admitting: Family

## 2019-04-23 ENCOUNTER — Other Ambulatory Visit: Payer: Self-pay | Admitting: Family

## 2019-05-05 ENCOUNTER — Encounter: Payer: Self-pay | Admitting: Family

## 2019-05-05 ENCOUNTER — Other Ambulatory Visit: Payer: Self-pay

## 2019-05-05 ENCOUNTER — Ambulatory Visit: Payer: Self-pay | Attending: Family | Admitting: Family

## 2019-05-05 VITALS — BP 140/89 | HR 79 | Resp 18 | Ht 69.0 in | Wt 219.8 lb

## 2019-05-05 DIAGNOSIS — Z9081 Acquired absence of spleen: Secondary | ICD-10-CM | POA: Insufficient documentation

## 2019-05-05 DIAGNOSIS — I110 Hypertensive heart disease with heart failure: Secondary | ICD-10-CM | POA: Insufficient documentation

## 2019-05-05 DIAGNOSIS — Z833 Family history of diabetes mellitus: Secondary | ICD-10-CM | POA: Insufficient documentation

## 2019-05-05 DIAGNOSIS — I252 Old myocardial infarction: Secondary | ICD-10-CM | POA: Insufficient documentation

## 2019-05-05 DIAGNOSIS — Z79899 Other long term (current) drug therapy: Secondary | ICD-10-CM | POA: Insufficient documentation

## 2019-05-05 DIAGNOSIS — I10 Essential (primary) hypertension: Secondary | ICD-10-CM

## 2019-05-05 DIAGNOSIS — I5022 Chronic systolic (congestive) heart failure: Secondary | ICD-10-CM

## 2019-05-05 DIAGNOSIS — Z905 Acquired absence of kidney: Secondary | ICD-10-CM | POA: Insufficient documentation

## 2019-05-05 DIAGNOSIS — Z720 Tobacco use: Secondary | ICD-10-CM

## 2019-05-05 DIAGNOSIS — Z7982 Long term (current) use of aspirin: Secondary | ICD-10-CM | POA: Insufficient documentation

## 2019-05-05 DIAGNOSIS — F17210 Nicotine dependence, cigarettes, uncomplicated: Secondary | ICD-10-CM | POA: Insufficient documentation

## 2019-05-05 DIAGNOSIS — G4733 Obstructive sleep apnea (adult) (pediatric): Secondary | ICD-10-CM | POA: Insufficient documentation

## 2019-05-05 DIAGNOSIS — Z955 Presence of coronary angioplasty implant and graft: Secondary | ICD-10-CM | POA: Insufficient documentation

## 2019-05-05 DIAGNOSIS — I2510 Atherosclerotic heart disease of native coronary artery without angina pectoris: Secondary | ICD-10-CM | POA: Insufficient documentation

## 2019-05-05 DIAGNOSIS — Z8249 Family history of ischemic heart disease and other diseases of the circulatory system: Secondary | ICD-10-CM | POA: Insufficient documentation

## 2019-05-05 NOTE — Progress Notes (Signed)
Patient ID: Brian Porter, male    DOB: [birth year], 53 y.o.   MRN: [id number]  HPI  Mr Mobley is a 53 y/o male with a history of obstructive sleep apnea (without CPAP), CAD, HTN, GERD, MI, current tobacco use and chronic heart failure.   Last echo was done 10/15/15 and showed an EF of 35% along with moderate MR/TR and an elevated PA pressure of 48 mm Hg.   Had a cardiac catheterization done 10/16/15 and showed an EF of 15% along with multi-vessel disease. Recommended treatment for cardiomyopathy.   Has not been admitted or been in the ED in the last 6 months.   He presents today for a follow-up visit with a chief complaint of minimal shortness of breath upon moderate exertion. He describes this as chronic in nature having been present for several years. He has associated intermittent cough and light-headedness along with this. He denies any difficulty sleeping, abdominal distention, palpitations, pedal edema, chest pain, fatigue or weight gain.    Past Medical History:  Diagnosis Date  . CAD (coronary artery disease)   . CHF (congestive heart failure) (HCC)   . GERD (gastroesophageal reflux disease)   . Hypertension   . MI, old   . Sleep apnea    Past Surgical History:  Procedure Laterality Date  . CARDIAC CATHETERIZATION Right 10/16/2015   Procedure: Left Heart Cath and Coronary Angiography;  Surgeon: Laurier Nancy, MD;  Location: [facility] INVASIVE CV LAB;  Service: Cardiovascular;  Laterality: Right;  . CORONARY ANGIOPLASTY WITH STENT PLACEMENT  approx 2 years ago  . HERNIA REPAIR Left 11/29/2016   [facility]  . PARTIAL NEPHRECTOMY Left   . SPLENECTOMY     Family History  Problem Relation Age of Onset  . Heart failure Mother   . Hypertension Mother   . Diabetes Mellitus II Brother    Social History   Tobacco Use  . Smoking status: Current Every Day Smoker    Packs/day: 1.00    Types: Cigarettes  . Smokeless tobacco: Never Used  Substance Use Topics  . Alcohol use: No   Alcohol/week: 0.0 standard drinks   No Known Allergies  Prior to Admission medications   Medication Sig Start Date End Date Taking? Authorizing Provider  aspirin 81 MG EC tablet Take 1 tablet (81 mg total) by mouth daily. 08/29/16  Yes Houston Siren, MD  furosemide (LASIX) 40 MG tablet Take 1 tablet by mouth once daily 03/07/19  Yes Clarisa Kindred A, FNP  metoprolol succinate (TOPROL XL) 25 MG 24 hr tablet Take 1 tablet (25 mg total) by mouth daily. 11/20/18  Yes , Inetta Fermo A, FNP  potassium chloride (KLOR-CON) 10 MEQ tablet Take 1 tablet (10 mEq total) by mouth daily. 04/23/19  Yes Delma Freeze, FNP    Review of Systems  Constitutional: Negative for appetite change and fatigue.  HENT: Negative for congestion, postnasal drip and sore throat.   Eyes: Negative.   Respiratory: Positive for cough (on occasion) and shortness of breath (little bit in the morning). Negative for chest tightness.   Cardiovascular: Negative for chest pain, palpitations and leg swelling.  Gastrointestinal: Negative for abdominal distention.  Endocrine: Negative.   Genitourinary: Negative.   Musculoskeletal: Negative for arthralgias and back pain.  Skin: Negative.   Allergic/Immunologic: Negative.   Neurological: Positive for light-headedness (at times). Negative for dizziness.  Hematological: Negative for adenopathy. Does not bruise/bleed easily.  Psychiatric/Behavioral: Negative for dysphoric mood, sleep disturbance (sleeping on 2 pillows) and  suicidal ideas. The patient is not nervous/anxious.    Vitals:   05/05/19 0925  BP: 140/89  Pulse: 79  Resp: 18  SpO2: 100%  Weight: 219 lb 12.8 oz (99.7 kg)  Height: 5\' 9"  (1.753 m)   Wt Readings from Last 3 Encounters:  05/05/19 219 lb 12.8 oz (99.7 kg)  11/02/18 218 lb (98.9 kg)  07/25/18 211 lb 4.8 oz (95.8 kg)   Lab Results  Component Value Date   CREATININE 0.79 07/24/2018   CREATININE 0.90 10/27/2017   CREATININE 0.86 08/17/2017     Physical  Exam  Constitutional: He is oriented to person, place, and time. He appears well-developed and well-nourished.  HENT:  Head: Normocephalic and atraumatic.  Neck: Normal range of motion. Neck supple. No JVD present.  Cardiovascular: Normal rate and regular rhythm.  Pulmonary/Chest: Effort normal. He has no wheezes. He has no rales.  Abdominal: Soft. He exhibits no distension. There is no abdominal tenderness.  Musculoskeletal:        General: No tenderness or edema.  Neurological: He is alert and oriented to person, place, and time.  Skin: Skin is warm and dry.  Psychiatric: He has a normal mood and affect. His behavior is normal. Thought content normal.  Nursing note and vitals reviewed.     Assessment & Plan:   1: Chronic heart failure with reduced ejection fraction- - NYHA class II - euvolemic in appearance  - weighing daily. Reminded to call for an overnight weight gain of >2 pounds or a weekly weight gain of >5 pounds.  - weight up 9 pounds since he was last here 1 year ago - echocardiogram scheduled for 05/13/2019 - not adding salt to his food and tries to follow a 2000mg  sodium diet - discussed adding entresto/ titrating metoprolol if EF remains low - reports getting his flu vaccine for this season - BNP 07/24/2018 was 207.0  2: HTN- - BP slightly elevated today - still hasn't gotten established with a PCP and he was strongly encouraged to do so - reviewed BMP from 07/24/2018 which showed sodium 138, potassium 4.0, creatinine 0.79 and GFR >60 - not interested in going to [facility]  3: Tobacco use- - smokes 1/2 ppd of cigarettes - complete cessation discussed for 3 minutes with him  Patient did not bring his medications nor a list. Each medication was verbally reviewed with the patient and he was encouraged to bring the bottles to every visit to confirm accuracy of list.  Return in 2 months or sooner for any questions/problems before then.

## 2019-05-05 NOTE — Patient Instructions (Signed)
Continue weighing daily and call for an overnight weight gain of > 2 pounds or a weekly weight gain of >5 pounds. 

## 2019-05-13 ENCOUNTER — Ambulatory Visit: Payer: Self-pay

## 2019-05-22 ENCOUNTER — Other Ambulatory Visit: Payer: Self-pay | Admitting: Family

## 2019-05-24 ENCOUNTER — Other Ambulatory Visit: Payer: Self-pay

## 2019-05-24 ENCOUNTER — Ambulatory Visit
Admission: RE | Admit: 2019-05-24 | Discharge: 2019-05-24 | Disposition: A | Discharge status: Home / Self Care | Payer: Self-pay | Source: Ambulatory Visit | Attending: Family | Admitting: Family

## 2019-05-24 DIAGNOSIS — I2510 Atherosclerotic heart disease of native coronary artery without angina pectoris: Secondary | ICD-10-CM | POA: Insufficient documentation

## 2019-05-24 DIAGNOSIS — I252 Old myocardial infarction: Secondary | ICD-10-CM | POA: Insufficient documentation

## 2019-05-24 DIAGNOSIS — I509 Heart failure, unspecified: Secondary | ICD-10-CM | POA: Insufficient documentation

## 2019-05-24 DIAGNOSIS — I5022 Chronic systolic (congestive) heart failure: Secondary | ICD-10-CM

## 2019-05-24 NOTE — Progress Notes (Signed)
*  PRELIMINARY RESULTS* Echocardiogram 2D Echocardiogram has been performed.  Sherrie Sport 05/24/2019, 11:40 AM

## 2019-07-09 ENCOUNTER — Ambulatory Visit: Payer: Self-pay | Admitting: Family

## 2019-07-25 NOTE — Progress Notes (Signed)
Patient ID: Brian Porter, male    DOB: 12-18-1965, 54 y.o.   MRN: [id number]  HPI  Mr Allinson is a 54 y/o male with a history of obstructive sleep apnea (without CPAP), CAD, HTN, GERD, MI, current tobacco use and chronic heart failure.   Echo report from 05/24/2019 reviewed and showed an EF of 30-35% along with trivial TR/PR. Echo done 10/15/15 and showed an EF of 35% along with moderate MR/TR and an elevated PA pressure of 48 mm Hg.   Had a cardiac catheterization done 10/16/15 and showed an EF of 15% along with multi-vessel disease. Recommended treatment for cardiomyopathy.   Has not been admitted or been in the ED in the last 6 months.   He presents today for a follow-up visit with a chief complaint of minimal shortness of breath upon moderate exertion. He describes this as chronic in nature having been present for several years. He has associated cough, light-headedness and leg muscle aches along with this. He denies any difficulty sleeping, abdominal distention, palpitations, pedal edema, chest pain, fatigue or weight gain.   Did get echo done a couple of months ago.    Past Medical History:  Diagnosis Date  . CAD (coronary artery disease)   . CHF (congestive heart failure) (HCC)   . GERD (gastroesophageal reflux disease)   . Hypertension   . MI, old   . Sleep apnea    Past Surgical History:  Procedure Laterality Date  . CARDIAC CATHETERIZATION Right 10/16/2015   Procedure: Left Heart Cath and Coronary Angiography;  Surgeon: Laurier Nancy, MD;  Location: [facility] INVASIVE CV LAB;  Service: Cardiovascular;  Laterality: Right;  . CORONARY ANGIOPLASTY WITH STENT PLACEMENT  approx 2 years ago  . HERNIA REPAIR Left 11/29/2016   [facility]  . PARTIAL NEPHRECTOMY Left   . SPLENECTOMY     Family History  Problem Relation Age of Onset  . Heart failure Mother   . Hypertension Mother   . Diabetes Mellitus II Brother    Social History   Tobacco Use  . Smoking status: Current Every Day  Smoker    Packs/day: 1.00    Types: Cigarettes  . Smokeless tobacco: Never Used  Substance Use Topics  . Alcohol use: No    Alcohol/week: 0.0 standard drinks   No Known Allergies   Prior to Admission medications   Medication Sig Start Date End Date Taking? Authorizing Provider  aspirin 81 MG EC tablet Take 1 tablet (81 mg total) by mouth daily. 08/29/16  Yes Houston Siren, MD  furosemide (LASIX) 40 MG tablet Take 1 tablet by mouth once daily 03/07/19  Yes Clarisa Kindred A, FNP  metoprolol succinate (TOPROL-XL) 25 MG 24 hr tablet Take 1 tablet by mouth once daily 05/22/19  Yes Genasis Zingale A, FNP  potassium chloride (KLOR-CON) 10 MEQ tablet Take 1 tablet (10 mEq total) by mouth daily. 04/23/19  Yes Delma Freeze, FNP    Review of Systems  Constitutional: Negative for appetite change and fatigue.  HENT: Negative for congestion, postnasal drip and sore throat.   Eyes: Negative.   Respiratory: Positive for cough (on occasion) and shortness of breath (little bit in the morning). Negative for chest tightness.   Cardiovascular: Negative for chest pain, palpitations and leg swelling.  Gastrointestinal: Negative for abdominal distention.  Endocrine: Negative.   Genitourinary: Negative.   Musculoskeletal: Positive for myalgias (legs hurt). Negative for arthralgias and back pain.  Skin: Negative.   Allergic/Immunologic: Negative.  Neurological: Positive for light-headedness (at times). Negative for dizziness.  Hematological: Negative for adenopathy. Does not bruise/bleed easily.  Psychiatric/Behavioral: Negative for dysphoric mood, sleep disturbance (sleeping on 2 pillows) and suicidal ideas. The patient is not nervous/anxious.    Vitals:   07/26/19 1325  BP: 138/80  Pulse: 98  Resp: 18  SpO2: 98%  Weight: 219 lb (99.3 kg)  Height: 5\' 10"  (1.778 m)   Wt Readings from Last 3 Encounters:  07/26/19 219 lb (99.3 kg)  05/05/19 219 lb 12.8 oz (99.7 kg)  11/02/18 218 lb (98.9 kg)    Lab Results  Component Value Date   CREATININE 0.79 07/24/2018   CREATININE 0.90 10/27/2017   CREATININE 0.86 08/17/2017     Physical Exam  Constitutional: He is oriented to person, place, and time. He appears well-developed and well-nourished.  HENT:  Head: Normocephalic and atraumatic.  Neck: No JVD present.  Cardiovascular: Normal rate and regular rhythm.  Pulmonary/Chest: Effort normal. He has no wheezes. He has no rales.  Abdominal: Soft. He exhibits no distension. There is no abdominal tenderness.  Musculoskeletal:        General: No tenderness or edema.     Cervical back: Normal range of motion and neck supple.  Neurological: He is alert and oriented to person, place, and time.  Skin: Skin is warm and dry.  Psychiatric: He has a normal mood and affect. His behavior is normal. Thought content normal.  Nursing note and vitals reviewed.     Assessment & Plan:   1: Chronic heart failure with reduced ejection fraction- - NYHA class II - euvolemic in appearance  - weighing daily. Reminded to call for an overnight weight gain of >2 pounds or a weekly weight gain of >5 pounds.  - weight stable since he was last here ~ 3 months ago - recent echo shows continued low EF; will begin entresto 24/26mg  and coupon given to patient to get the first month of medication at no charge; [employer] application also filled out for him - will get BMP at his next visit - not adding salt to his food and tries to follow a 2000mg  sodium diet - reports getting his flu vaccine for this season - BNP 07/24/2018 was 207.0  2: HTN- - BP looks good today - still hasn't gotten established with a PCP but says that he's considering going to [facility] - reviewed BMP from 07/24/2018 which showed sodium 138, potassium 4.0, creatinine 0.79 and GFR >60 - not interested in going to [facility]  3: Tobacco use- - smokes 1/2 ppd of cigarettes - complete cessation discussed for 3 minutes  with him  Patient did not bring his medications nor a list. Each medication was verbally reviewed with the patient and he was encouraged to bring the bottles to every visit to confirm accuracy of list.  Return in 1 month or sooner for any questions/problems before then.

## 2019-07-26 ENCOUNTER — Other Ambulatory Visit: Payer: Self-pay

## 2019-07-26 ENCOUNTER — Encounter: Payer: Self-pay | Admitting: Family

## 2019-07-26 ENCOUNTER — Ambulatory Visit: Payer: Self-pay | Attending: Family | Admitting: Family

## 2019-07-26 VITALS — BP 138/80 | HR 98 | Resp 18 | Ht 70.0 in | Wt 219.0 lb

## 2019-07-26 DIAGNOSIS — R42 Dizziness and giddiness: Secondary | ICD-10-CM | POA: Insufficient documentation

## 2019-07-26 DIAGNOSIS — F17210 Nicotine dependence, cigarettes, uncomplicated: Secondary | ICD-10-CM | POA: Insufficient documentation

## 2019-07-26 DIAGNOSIS — I252 Old myocardial infarction: Secondary | ICD-10-CM | POA: Insufficient documentation

## 2019-07-26 DIAGNOSIS — Z79899 Other long term (current) drug therapy: Secondary | ICD-10-CM | POA: Insufficient documentation

## 2019-07-26 DIAGNOSIS — Z720 Tobacco use: Secondary | ICD-10-CM

## 2019-07-26 DIAGNOSIS — R05 Cough: Secondary | ICD-10-CM | POA: Insufficient documentation

## 2019-07-26 DIAGNOSIS — R0602 Shortness of breath: Secondary | ICD-10-CM | POA: Insufficient documentation

## 2019-07-26 DIAGNOSIS — Z7982 Long term (current) use of aspirin: Secondary | ICD-10-CM | POA: Insufficient documentation

## 2019-07-26 DIAGNOSIS — Z8249 Family history of ischemic heart disease and other diseases of the circulatory system: Secondary | ICD-10-CM | POA: Insufficient documentation

## 2019-07-26 DIAGNOSIS — I5022 Chronic systolic (congestive) heart failure: Secondary | ICD-10-CM | POA: Insufficient documentation

## 2019-07-26 DIAGNOSIS — I2510 Atherosclerotic heart disease of native coronary artery without angina pectoris: Secondary | ICD-10-CM | POA: Insufficient documentation

## 2019-07-26 DIAGNOSIS — I110 Hypertensive heart disease with heart failure: Secondary | ICD-10-CM | POA: Insufficient documentation

## 2019-07-26 DIAGNOSIS — I10 Essential (primary) hypertension: Secondary | ICD-10-CM

## 2019-07-26 MED ORDER — SACUBITRIL-VALSARTAN 24-26 MG PO TABS: 1.0000 | ORAL_TABLET | Freq: Two times a day (BID) | ORAL | 3 refills | Status: DC

## 2019-07-26 NOTE — Patient Instructions (Addendum)
Continue weighing daily and call for an overnight weight gain of > 2 pounds or a weekly weight gain of >5 pounds.  Begin entresto by taking 1 tablet by mouth twice daily

## 2019-08-27 ENCOUNTER — Ambulatory Visit: Payer: Self-pay | Admitting: Family

## 2019-09-01 NOTE — Progress Notes (Signed)
Patient ID: Brian Porter, male    DOB: 07/13/1965, 54 y.o.   MRN: [id number]  HPI  Mr Albornoz is a 54 y/o male with a history of obstructive sleep apnea (without CPAP), CAD, HTN, GERD, MI, current tobacco use and chronic heart failure.   Echo report from 05/24/2019 reviewed and showed an EF of 30-35% along with trivial TR/PR. Echo done 10/15/15 and showed an EF of 35% along with moderate MR/TR and an elevated PA pressure of 48 mm Hg.   Had a cardiac catheterization done 10/16/15 and showed an EF of 15% along with multi-vessel disease. Recommended treatment for cardiomyopathy.   Has not been admitted or been in the ED in the last 6 months.   He presents today for a follow-up visit with a chief complaint of minimal light-headedness with sudden position changes. He describes this as intermittent in nature. He has associated cough and slight weight gain along with this. He denies any difficulty sleeping, abdominal distention, palpitations, pedal edema, chest pain, shortness of breath or fatigue.   Started entresto 24/26mg  at last visit and says that he feels better since starting it. Will need assistance with obtaining the medication.    Past Medical History:  Diagnosis Date  . CAD (coronary artery disease)   . CHF (congestive heart failure) ([place])   . GERD (gastroesophageal reflux disease)   . Hypertension   . MI, old   . Sleep apnea    Past Surgical History:  Procedure Laterality Date  . CARDIAC CATHETERIZATION Right 10/16/2015   Procedure: Left Heart Cath and Coronary Angiography;  Surgeon: Dionisio Octavio, MD;  Location: [facility];  Service: Cardiovascular;  Laterality: Right;  . CORONARY ANGIOPLASTY WITH STENT PLACEMENT  approx 2 years ago  . HERNIA REPAIR Left 11/29/2016   [facility]  . PARTIAL NEPHRECTOMY Left   . SPLENECTOMY     Family History  Problem Relation Age of Onset  . Heart failure Mother   . Hypertension Mother   . Diabetes Mellitus II Brother    Social History    Tobacco Use  . Smoking status: Current Every Day Smoker    Packs/day: 1.00    Types: Cigarettes  . Smokeless tobacco: Never Used  Substance Use Topics  . Alcohol use: No    Alcohol/week: 0.0 standard drinks   No Known Allergies  Prior to Admission medications   Medication Sig Start Date End Date Taking? Authorizing Provider  aspirin 81 MG EC tablet Take 1 tablet (81 mg total) by mouth daily. 08/29/16  Yes Henreitta Leber, MD  furosemide (LASIX) 40 MG tablet Take 1 tablet by mouth once daily 03/07/19  Yes Darylene Price A, FNP  metoprolol succinate (TOPROL-XL) 25 MG 24 hr tablet Take 1 tablet by mouth once daily 05/22/19  Yes Raffaele Derise A, FNP  potassium chloride (KLOR-CON) 10 MEQ tablet Take 1 tablet (10 mEq total) by mouth daily. 04/23/19  Yes Elroy Schembri A, FNP  sacubitril-valsartan (ENTRESTO) 24-26 MG Take 1 tablet by mouth 2 (two) times daily. 07/26/19  Yes Alisa Graff, FNP    Review of Systems  Constitutional: Negative for appetite change and fatigue.  HENT: Negative for congestion, postnasal drip and sore throat.   Eyes: Negative.   Respiratory: Positive for cough (on occasion). Negative for chest tightness and shortness of breath.   Cardiovascular: Negative for chest pain, palpitations and leg swelling.  Gastrointestinal: Negative for abdominal distention.  Endocrine: Negative.   Genitourinary: Negative.  Musculoskeletal: Positive for myalgias (legs hurt). Negative for arthralgias and back pain.  Skin: Negative.   Allergic/Immunologic: Negative.   Neurological: Positive for light-headedness (at times). Negative for dizziness.  Hematological: Negative for adenopathy. Does not bruise/bleed easily.  Psychiatric/Behavioral: Negative for dysphoric mood, sleep disturbance (sleeping on 2 pillows) and suicidal ideas. The patient is not nervous/anxious.    Vitals:   09/02/19 0916  BP: 117/75  Pulse: 80  Resp: 18  SpO2: 99%  Weight: 223 lb (101.2 kg)  Height: 5\' 9"   (1.753 m)   Wt Readings from Last 3 Encounters:  09/02/19 223 lb (101.2 kg)  07/26/19 219 lb (99.3 kg)  05/05/19 219 lb 12.8 oz (99.7 kg)   Lab Results  Component Value Date   CREATININE 0.79 07/24/2018   CREATININE 0.90 10/27/2017   CREATININE 0.86 08/17/2017    Physical Exam  Constitutional: He is oriented to person, place, and time. He appears well-developed and well-nourished.  HENT:  Head: Normocephalic and atraumatic.  Neck: No JVD present.  Cardiovascular: Normal rate and regular rhythm.  Pulmonary/Chest: Effort normal. He has no wheezes. He has no rales.  Abdominal: Soft. He exhibits no distension. There is no abdominal tenderness.  Musculoskeletal:        General: No tenderness or edema.     Cervical back: Normal range of motion and neck supple.  Neurological: He is alert and oriented to person, place, and time.  Skin: Skin is warm and dry.  Psychiatric: He has a normal mood and affect. His behavior is normal. Thought content normal.  Nursing note and vitals reviewed.     Assessment & Plan:   1: Chronic heart failure with reduced ejection fraction- - NYHA class I - euvolemic in appearance  - weighing daily. Reminded to call for an overnight weight gain of >2 pounds or a weekly weight gain of >5 pounds.  - weight up 4 pounds since he was last here 1 month ago - entresto 24/26mg  started at last visit & he says that he feels better since starting it; 1 month samples of entresto 24/26mg  given and [employer] patient assistance forms filled out today - will get BMP today since he's now on entresto - will increase metoprolol succinate to 50mg  daily; he can finish his current bottle by taking 2 tablets daily until gone and then when he picks up the 50mg  dose, he'll resume taking 1 tablet daily - not adding salt to his food and tries to follow a 2000mg  sodium diet - reports getting his flu vaccine for this season - BNP 07/24/2018 was 207.0  2: HTN- - BP looks good today -  still hasn't gotten established with a PCP but says that he's considering going to [facility] - reviewed BMP from 07/24/2018 which showed sodium 138, potassium 4.0, creatinine 0.79 and GFR >60 - [facility]  3: Tobacco use- - smokes 1 ppd of cigarettes - complete cessation discussed for 3 minutes with him  Patient did not bring his medications nor a list. Each medication was verbally reviewed with the patient and he was encouraged to bring the bottles to every visit to confirm accuracy of list.  Return in 1 month or sooner for any questions/problems before then.

## 2019-09-02 ENCOUNTER — Encounter: Payer: Self-pay | Admitting: Family

## 2019-09-02 ENCOUNTER — Other Ambulatory Visit: Payer: Self-pay

## 2019-09-02 ENCOUNTER — Ambulatory Visit: Payer: Self-pay | Attending: Family | Admitting: Family

## 2019-09-02 VITALS — BP 117/75 | HR 80 | Resp 18 | Ht 69.0 in | Wt 223.0 lb

## 2019-09-02 DIAGNOSIS — Z8249 Family history of ischemic heart disease and other diseases of the circulatory system: Secondary | ICD-10-CM | POA: Insufficient documentation

## 2019-09-02 DIAGNOSIS — G4733 Obstructive sleep apnea (adult) (pediatric): Secondary | ICD-10-CM | POA: Insufficient documentation

## 2019-09-02 DIAGNOSIS — I10 Essential (primary) hypertension: Secondary | ICD-10-CM

## 2019-09-02 DIAGNOSIS — I252 Old myocardial infarction: Secondary | ICD-10-CM | POA: Insufficient documentation

## 2019-09-02 DIAGNOSIS — R05 Cough: Secondary | ICD-10-CM | POA: Insufficient documentation

## 2019-09-02 DIAGNOSIS — Z79899 Other long term (current) drug therapy: Secondary | ICD-10-CM | POA: Insufficient documentation

## 2019-09-02 DIAGNOSIS — Z7982 Long term (current) use of aspirin: Secondary | ICD-10-CM | POA: Insufficient documentation

## 2019-09-02 DIAGNOSIS — R42 Dizziness and giddiness: Secondary | ICD-10-CM | POA: Insufficient documentation

## 2019-09-02 DIAGNOSIS — I5022 Chronic systolic (congestive) heart failure: Secondary | ICD-10-CM | POA: Insufficient documentation

## 2019-09-02 DIAGNOSIS — I2510 Atherosclerotic heart disease of native coronary artery without angina pectoris: Secondary | ICD-10-CM | POA: Insufficient documentation

## 2019-09-02 DIAGNOSIS — I110 Hypertensive heart disease with heart failure: Secondary | ICD-10-CM | POA: Insufficient documentation

## 2019-09-02 DIAGNOSIS — F17210 Nicotine dependence, cigarettes, uncomplicated: Secondary | ICD-10-CM | POA: Insufficient documentation

## 2019-09-02 DIAGNOSIS — Z720 Tobacco use: Secondary | ICD-10-CM

## 2019-09-02 DIAGNOSIS — Z955 Presence of coronary angioplasty implant and graft: Secondary | ICD-10-CM | POA: Insufficient documentation

## 2019-09-02 LAB — BASIC METABOLIC PANEL
Anion gap: 6 (ref 5–15)
BUN: 14 mg/dL (ref 6–20)
CO2: 25 mmol/L (ref 22–32)
Calcium: 8.8 mg/dL — ABNORMAL LOW (ref 8.9–10.3)
Chloride: 107 mmol/L (ref 98–111)
Creatinine, Ser: 0.81 mg/dL (ref 0.61–1.24)
GFR calc Af Amer: >60 mL/min (ref >60)
GFR calc non Af Amer: >60 mL/min (ref >60)
Glucose, Bld: 81 mg/dL (ref 70–99)
Potassium: 4.2 mmol/L (ref 3.5–5.1)
Sodium: 138 mmol/L (ref 135–145)

## 2019-09-02 MED ORDER — METOPROLOL SUCCINATE ER 50 MG PO TB24: 50.0000 mg | ORAL_TABLET | Freq: Every day | ORAL | 3 refills | Status: DC

## 2019-09-02 NOTE — Patient Instructions (Addendum)
Continue weighing daily and call for an overnight weight gain of > 2 pounds or a weekly weight gain of >5 pounds.  Finish your current metoprolol dose by taking 2 tablets every morning until gone. When you pick up the new bottle of 50mg  tablets, you will go back to taking 1 tablet daily.

## 2019-10-05 ENCOUNTER — Ambulatory Visit: Payer: Self-pay | Admitting: Family

## 2019-10-08 ENCOUNTER — Encounter: Payer: Self-pay | Admitting: Family

## 2019-10-08 ENCOUNTER — Other Ambulatory Visit: Payer: Self-pay

## 2019-10-08 ENCOUNTER — Ambulatory Visit: Payer: Self-pay | Attending: Family | Admitting: Family

## 2019-10-08 VITALS — BP 137/89 | HR 68 | Resp 18 | Ht 70.0 in | Wt 224.4 lb

## 2019-10-08 DIAGNOSIS — M79604 Pain in right leg: Secondary | ICD-10-CM | POA: Insufficient documentation

## 2019-10-08 DIAGNOSIS — I110 Hypertensive heart disease with heart failure: Secondary | ICD-10-CM | POA: Insufficient documentation

## 2019-10-08 DIAGNOSIS — I10 Essential (primary) hypertension: Secondary | ICD-10-CM

## 2019-10-08 DIAGNOSIS — F17210 Nicotine dependence, cigarettes, uncomplicated: Secondary | ICD-10-CM | POA: Insufficient documentation

## 2019-10-08 DIAGNOSIS — Z905 Acquired absence of kidney: Secondary | ICD-10-CM | POA: Insufficient documentation

## 2019-10-08 DIAGNOSIS — G4733 Obstructive sleep apnea (adult) (pediatric): Secondary | ICD-10-CM | POA: Insufficient documentation

## 2019-10-08 DIAGNOSIS — Z79899 Other long term (current) drug therapy: Secondary | ICD-10-CM | POA: Insufficient documentation

## 2019-10-08 DIAGNOSIS — Z955 Presence of coronary angioplasty implant and graft: Secondary | ICD-10-CM | POA: Insufficient documentation

## 2019-10-08 DIAGNOSIS — I2510 Atherosclerotic heart disease of native coronary artery without angina pectoris: Secondary | ICD-10-CM | POA: Insufficient documentation

## 2019-10-08 DIAGNOSIS — I5022 Chronic systolic (congestive) heart failure: Secondary | ICD-10-CM | POA: Insufficient documentation

## 2019-10-08 DIAGNOSIS — Z720 Tobacco use: Secondary | ICD-10-CM

## 2019-10-08 DIAGNOSIS — K219 Gastro-esophageal reflux disease without esophagitis: Secondary | ICD-10-CM | POA: Insufficient documentation

## 2019-10-08 DIAGNOSIS — R05 Cough: Secondary | ICD-10-CM | POA: Insufficient documentation

## 2019-10-08 DIAGNOSIS — Z8249 Family history of ischemic heart disease and other diseases of the circulatory system: Secondary | ICD-10-CM | POA: Insufficient documentation

## 2019-10-08 DIAGNOSIS — Z7982 Long term (current) use of aspirin: Secondary | ICD-10-CM | POA: Insufficient documentation

## 2019-10-08 DIAGNOSIS — I252 Old myocardial infarction: Secondary | ICD-10-CM | POA: Insufficient documentation

## 2019-10-08 MED ORDER — LOSARTAN POTASSIUM 50 MG PO TABS: 50.0000 mg | ORAL_TABLET | Freq: Every day | ORAL | 5 refills | Status: DC

## 2019-10-08 NOTE — Progress Notes (Signed)
Patient ID: Brian Porter, male    DOB: Mar 12, 1966, 54 y.o.   MRN: [id number]  HPI  Brian Porter is a 54 y/o male with a history of obstructive sleep apnea (without CPAP), CAD, HTN, GERD, MI, current tobacco use and chronic heart failure.   Echo report from 05/24/2019 reviewed and showed an EF of 30-35% along with trivial TR/PR. Echo done 10/15/15 and showed an EF of 35% along with moderate Brian/TR and an elevated PA pressure of 48 mm Hg.   Had a cardiac catheterization done 10/16/15 and showed an EF of 15% along with multi-vessel disease. Recommended treatment for cardiomyopathy.   Has not been admitted or been in the ED in the last 6 months.   He presents today for a follow-up visit with a chief complaint of productive cough which became worse while taking entresto. He stopped the entresto and says that his cough is improving since he stopped the entresto. He has associated light-headedness, right leg pain and slight weight gain along with this. He denies any difficulty sleeping, abdominal distention, palpitations, pedal edema, chest pain, shortness of breath or fatigue.   Has been doing some walking for exercise but admits that he's not eating "like he should". Feels like he's eating "too much".    Past Medical History:  Diagnosis Date  . CAD (coronary artery disease)   . CHF (congestive heart failure) ([place])   . GERD (gastroesophageal reflux disease)   . Hypertension   . MI, old   . Sleep apnea    Past Surgical History:  Procedure Laterality Date  . CARDIAC CATHETERIZATION Right 10/16/2015   Procedure: Left Heart Cath and Coronary Angiography;  Surgeon: Dionisio Jameir, MD;  Location: [facility];  Service: Cardiovascular;  Laterality: Right;  . CORONARY ANGIOPLASTY WITH STENT PLACEMENT  approx 2 years ago  . HERNIA REPAIR Left 11/29/2016   [facility]  . PARTIAL NEPHRECTOMY Left   . SPLENECTOMY     Family History  Problem Relation Age of Onset  . Heart failure Mother   .  Hypertension Mother   . Diabetes Mellitus II Brother    Social History   Tobacco Use  . Smoking status: Current Every Day Smoker    Packs/day: 1.00    Types: Cigarettes  . Smokeless tobacco: Never Used  Substance Use Topics  . Alcohol use: No    Alcohol/week: 0.0 standard drinks   No Known Allergies  Prior to Admission medications   Medication Sig Start Date End Date Taking? Authorizing Provider  aspirin 81 MG EC tablet Take 1 tablet (81 mg total) by mouth daily. 08/29/16  Yes Henreitta Leber, MD  furosemide (LASIX) 40 MG tablet Take 1 tablet by mouth once daily 03/07/19  Yes Darylene Price A, FNP  metoprolol succinate (TOPROL-XL) 50 MG 24 hr tablet Take 1 tablet (50 mg total) by mouth daily. Take with or immediately following a meal. 09/02/19 12/01/19 Yes Tytus Strahle, Otila Kluver A, FNP  potassium chloride (KLOR-CON) 10 MEQ tablet Take 1 tablet (10 mEq total) by mouth daily. 04/23/19  Yes Kaylib Furness A, FNP  sacubitril-valsartan (ENTRESTO) 24-26 MG Take 1 tablet by mouth 2 (two) times daily. Patient not taking: Reported on 10/08/2019 07/26/19   Alisa Graff, FNP    Review of Systems  Constitutional: Negative for appetite change and fatigue.  HENT: Negative for congestion, postnasal drip and sore throat.   Eyes: Negative.   Respiratory: Positive for cough (was worse on entresto). Negative for chest  tightness and shortness of breath.   Cardiovascular: Negative for chest pain, palpitations and leg swelling.  Gastrointestinal: Negative for abdominal distention.  Endocrine: Negative.   Genitourinary: Negative.   Musculoskeletal: Positive for myalgias (legs hurt). Negative for arthralgias and back pain.  Skin: Negative.   Allergic/Immunologic: Negative.   Neurological: Positive for light-headedness (at times). Negative for dizziness.  Hematological: Negative for adenopathy. Does not bruise/bleed easily.  Psychiatric/Behavioral: Negative for dysphoric mood, sleep disturbance (sleeping on 2 pillows)  and suicidal ideas. The patient is not nervous/anxious.    Vitals:   10/08/19 0821  BP: 137/89  Pulse: 68  Resp: 18  SpO2: 99%  Weight: 224 lb 6 oz (101.8 kg)  Height: 5\' 10"  (1.778 m)   Wt Readings from Last 3 Encounters:  10/08/19 224 lb 6 oz (101.8 kg)  09/02/19 223 lb (101.2 kg)  07/26/19 219 lb (99.3 kg)   Lab Results  Component Value Date   CREATININE 0.81 09/02/2019   CREATININE 0.79 07/24/2018   CREATININE 0.90 10/27/2017     Physical Exam  Constitutional: He is oriented to person, place, and time. He appears well-developed and well-nourished.  HENT:  Head: Normocephalic and atraumatic.  Neck: No JVD present.  Cardiovascular: Normal rate and regular rhythm.  Pulmonary/Chest: Effort normal. He has no wheezes. He has no rales.  Abdominal: Soft. He exhibits no distension. There is no abdominal tenderness.  Musculoskeletal:        General: No tenderness or edema.     Cervical back: Normal range of motion and neck supple.  Neurological: He is alert and oriented to person, place, and time.  Skin: Skin is warm and dry.  Psychiatric: He has a normal mood and affect. His behavior is normal. Thought content normal.  Nursing note and vitals reviewed.     Assessment & Plan:   1: Chronic heart failure with reduced ejection fraction- - NYHA class I - euvolemic in appearance  - weighing daily. Reminded to call for an overnight weight gain of >2 pounds or a weekly weight gain of >5 pounds.  - weight up 1.6 pounds since he was last here 1 month ago - will stop his entresto and begin losartan 50mg  daily; have added entresto to allergy list - metoprolol succinate increased to 50mg  daily at his last visit & HR is coming down nicely; consider titration if able in the future - consider adding farxiga but he would need patient assistance program for this - not adding salt to his food and tries to follow a 2000mg  sodium diet - reports getting his flu vaccine for this season - BNP  07/24/2018 was 207.0  2: HTN- - BP looks good - still hasn't gotten established with a PCP but says that he's contacted [facility]; encouraged him to f/u with them to get an appointment scheduled so his leg pain can be addressed - reviewed BMP from 09/02/19 which showed sodium 138, potassium 4.2, creatinine 0.81 and GFR >60 - not [facility]  3: Tobacco use- - smokes 1 ppd of cigarettes - complete cessation discussed for 3 minutes with him    Medication bottles were reviewed.   Return in 1 month or sooner for any questions/problems before then.

## 2019-10-08 NOTE — Patient Instructions (Signed)
Continue weighing daily and call for an overnight weight gain of > 2 pounds or a weekly weight gain of >5 pounds. 

## 2019-11-12 ENCOUNTER — Ambulatory Visit: Payer: Self-pay | Admitting: Family

## 2019-11-18 NOTE — Progress Notes (Signed)
Patient ID: Brian Porter, male    DOB: Nov 11, 1965, 54 y.o.   MRN: [id number]  HPI  Brian Porter is a 54 y/o male with a history of obstructive sleep apnea (without CPAP), CAD, HTN, GERD, MI, current tobacco use and chronic heart failure.   Echo report from 05/24/2019 reviewed and showed an EF of 30-35% along with trivial TR/PR. Echo done 10/15/15 and showed an EF of 35% along with moderate Brian/TR and an elevated PA pressure of 48 mm Hg.   Had a cardiac catheterization done 10/16/15 and showed an EF of 15% along with multi-vessel disease. Recommended treatment for cardiomyopathy.   Has not been admitted or been in the ED in the last 6 months.   He presents today for a follow-up visit with a chief complaint of productive cough. He describes this as chronic in nature having been present for several years although he says that it has improved since he was changed from entresto to losartan. He has associated right leg pain along with this. He denies any difficulty sleeping, dizziness, abdominal distention, palpitations, pedal edema, chest pain, shortness of breath, fatigue or weight gain.   Still has not scheduled an appointment with [facility] as she says that he's had a recent death in his family and has been quite busy with that.   Past Medical History:  Diagnosis Date  . CAD (coronary artery disease)   . CHF (congestive heart failure) ([facility])   . GERD (gastroesophageal reflux disease)   . Hypertension   . MI, old   . Sleep apnea    Past Surgical History:  Procedure Laterality Date  . CARDIAC CATHETERIZATION Right 10/16/2015   Procedure: Left Heart Cath and Coronary Angiography;  Surgeon: Laurier Nancy, MD;  Location: [facility];  Service: Cardiovascular;  Laterality: Right;  . CORONARY ANGIOPLASTY WITH STENT PLACEMENT  approx 2 years ago  . HERNIA REPAIR Left 11/29/2016   [facility]  . PARTIAL NEPHRECTOMY Left   . SPLENECTOMY     Family History  Problem Relation Age of Onset  .  Heart failure Mother   . Hypertension Mother   . Diabetes Mellitus II Brother    Social History   Tobacco Use  . Smoking status: Current Every Day Smoker    Packs/day: 1.00    Types: Cigarettes  . Smokeless tobacco: Never Used  Substance Use Topics  . Alcohol use: No    Alcohol/week: 0.0 standard drinks   Allergies  Allergen Reactions  . Entresto [Sacubitril-Valsartan] Cough   Prior to Admission medications   Medication Sig Start Date End Date Taking? Authorizing Provider  aspirin 81 MG EC tablet Take 1 tablet (81 mg total) by mouth daily. 08/29/16   Houston Siren, MD  furosemide (LASIX) 40 MG tablet Take 1 tablet by mouth once daily 03/07/19   Clarisa Kindred A, FNP  losartan (COZAAR) 50 MG tablet Take 1 tablet (50 mg total) by mouth daily. 10/08/19 01/06/20  Delma Freeze, FNP  metoprolol succinate (TOPROL-XL) 50 MG 24 hr tablet Take 1 tablet (50 mg total) by mouth daily. Take with or immediately following a meal. 09/02/19 12/01/19  Clarisa Kindred A, FNP  potassium chloride (KLOR-CON) 10 MEQ tablet Take 1 tablet (10 mEq total) by mouth daily. 04/23/19   Delma Freeze, FNP    Review of Systems  Constitutional: Negative for appetite change and fatigue.  HENT: Negative for congestion, postnasal drip and sore throat.   Eyes: Negative.  Respiratory: Positive for cough (improving). Negative for chest tightness and shortness of breath.   Cardiovascular: Negative for chest pain, palpitations and leg swelling.  Gastrointestinal: Negative for abdominal distention.  Endocrine: Negative.   Genitourinary: Negative.   Musculoskeletal: Positive for myalgias (right leg). Negative for back pain.  Skin: Negative.   Allergic/Immunologic: Negative.   Neurological: Negative for dizziness and light-headedness.  Hematological: Negative for adenopathy. Does not bruise/bleed easily.  Psychiatric/Behavioral: Negative for dysphoric mood, sleep disturbance (sleeping on 2 pillows) and suicidal ideas. The  patient is not nervous/anxious.    Vitals:   11/19/19 1232  BP: 127/81  Pulse: 68  Resp: 20  SpO2: 98%  Weight: 227 lb (103 kg)  Height: 5\' 10"  (1.778 m)   Wt Readings from Last 3 Encounters:  11/19/19 227 lb (103 kg)  10/08/19 224 lb 6 oz (101.8 kg)  09/02/19 223 lb (101.2 kg)   Lab Results  Component Value Date   CREATININE 0.81 09/02/2019   CREATININE 0.79 07/24/2018   CREATININE 0.90 10/27/2017   Physical Exam  Constitutional: He is oriented to person, place, and time. He appears well-developed and well-nourished.  HENT:  Head: Normocephalic and atraumatic.  Neck: No JVD present.  Cardiovascular: Normal rate and regular rhythm.  Pulmonary/Chest: Effort normal. He has no wheezes. He has no rales.  Abdominal: Soft. He exhibits no distension. There is no abdominal tenderness.  Musculoskeletal:        General: No tenderness or edema.     Cervical back: Normal range of motion and neck supple.  Neurological: He is alert and oriented to person, place, and time.  Skin: Skin is warm and dry.  Psychiatric: He has a normal mood and affect. His behavior is normal. Thought content normal.  Nursing note and vitals reviewed.     Assessment & Plan:   1: Chronic heart failure with reduced ejection fraction- - NYHA class I - euvolemic in appearance  - weighing daily. Reminded to call for an overnight weight gain of >2 pounds or a weekly weight gain of >5 pounds.  - weight up 2.4 pounds since he was last here 1 month ago - losartan 50mg  daily started at last visit due to cough with entresto   - titrate metoprolol succinate or losartan at next visit if able - consider adding farxiga but he would need patient assistance program for this; could also add spironolactone - not adding salt to his food and tries to follow a 2000mg  sodium diet - BNP 07/24/2018 was 207.0  2: HTN- - BP looks good today - still hasn't gotten established with a PCP but says that he's contacted [facility] in the past; encouraged him to f/u with them to get an appointment scheduled so his leg pain can be addressed - reviewed BMP from 09/02/19 which showed sodium 138, potassium 4.2, creatinine 0.81 and GFR >60 - not [facility]  3: Tobacco use- - smokes 1/2 ppd of cigarettes - complete cessation discussed for 3 minutes with him    Medication bottles were reviewed.   Return in 3 months or sooner for any questions/problems before then.

## 2019-11-19 ENCOUNTER — Other Ambulatory Visit: Payer: Self-pay

## 2019-11-19 ENCOUNTER — Encounter: Payer: Self-pay | Admitting: Family

## 2019-11-19 ENCOUNTER — Ambulatory Visit: Payer: Self-pay | Attending: Family | Admitting: Family

## 2019-11-19 VITALS — BP 127/81 | HR 68 | Resp 20 | Ht 70.0 in | Wt 227.0 lb

## 2019-11-19 DIAGNOSIS — Z720 Tobacco use: Secondary | ICD-10-CM

## 2019-11-19 DIAGNOSIS — G4733 Obstructive sleep apnea (adult) (pediatric): Secondary | ICD-10-CM | POA: Insufficient documentation

## 2019-11-19 DIAGNOSIS — I2510 Atherosclerotic heart disease of native coronary artery without angina pectoris: Secondary | ICD-10-CM | POA: Insufficient documentation

## 2019-11-19 DIAGNOSIS — Z8249 Family history of ischemic heart disease and other diseases of the circulatory system: Secondary | ICD-10-CM | POA: Insufficient documentation

## 2019-11-19 DIAGNOSIS — Z905 Acquired absence of kidney: Secondary | ICD-10-CM | POA: Insufficient documentation

## 2019-11-19 DIAGNOSIS — Z79899 Other long term (current) drug therapy: Secondary | ICD-10-CM | POA: Insufficient documentation

## 2019-11-19 DIAGNOSIS — Z7982 Long term (current) use of aspirin: Secondary | ICD-10-CM | POA: Insufficient documentation

## 2019-11-19 DIAGNOSIS — I10 Essential (primary) hypertension: Secondary | ICD-10-CM

## 2019-11-19 DIAGNOSIS — I110 Hypertensive heart disease with heart failure: Secondary | ICD-10-CM | POA: Insufficient documentation

## 2019-11-19 DIAGNOSIS — F17210 Nicotine dependence, cigarettes, uncomplicated: Secondary | ICD-10-CM | POA: Insufficient documentation

## 2019-11-19 DIAGNOSIS — Z833 Family history of diabetes mellitus: Secondary | ICD-10-CM | POA: Insufficient documentation

## 2019-11-19 DIAGNOSIS — Z888 Allergy status to other drugs, medicaments and biological substances status: Secondary | ICD-10-CM | POA: Insufficient documentation

## 2019-11-19 DIAGNOSIS — K219 Gastro-esophageal reflux disease without esophagitis: Secondary | ICD-10-CM | POA: Insufficient documentation

## 2019-11-19 DIAGNOSIS — Z955 Presence of coronary angioplasty implant and graft: Secondary | ICD-10-CM | POA: Insufficient documentation

## 2019-11-19 DIAGNOSIS — I252 Old myocardial infarction: Secondary | ICD-10-CM | POA: Insufficient documentation

## 2019-11-19 DIAGNOSIS — I5022 Chronic systolic (congestive) heart failure: Secondary | ICD-10-CM | POA: Insufficient documentation

## 2019-11-19 NOTE — Patient Instructions (Signed)
Continue weighing daily and call for an overnight weight gain of > 2 pounds or a weekly weight gain of >5 pounds. 

## 2019-11-19 NOTE — Progress Notes (Signed)
[facility] - PHARMACIST COUNSELING NOTE  ADHERENCE ASSESSMENT  Adherence strategy: Patient brought medication bottles to visit today. Takes medications from bottles.   Do you ever forget to take your medication? [] Yes (1) [x] No (0)  Do you ever skip doses due to side effects? [] Yes (1) [x] No (0)  Do you have trouble affording your medicines? [] Yes (1) [x] No (0)  Are you ever unable to pick up your medication due to transportation difficulties? [] Yes (1) [x] No (0)  Do you ever stop taking your medications because you don't believe they are helping? [] Yes (1) [x] No (0)  Total score 0   Recommendations given to patient about increasing adherence: None  Guideline-Directed Medical Therapy/Evidence Based Medicine  ACE/ARB/ARNI: Losartan 50 mg daily Beta Blocker: Metoprolol succinate 50 mg daily Aldosterone Antagonist: None Diuretic: Furosemide 40 mg daily    SUBJECTIVE  HPI: Patient is a 54 y/o M with PMH as below who presents to CHF clinic for follow-up. Upon chart review, last admission was in 07/2018 for influenza.  Past Medical History:  Diagnosis Date  . CAD (coronary artery disease)   . CHF (congestive heart failure) ([facility])   . GERD (gastroesophageal reflux disease)   . Hypertension   . MI, old   . Sleep apnea       OBJECTIVE   Vital signs: HR 68, BP 127/81, weight (pounds) 227 ECHO: Date 05/24/19, EF 30-35%, notes: No LVH, grade I diastolic dysfunction Cath: Date 10/16/15, EF 15%, notes: patent LAD stent, multivessel disease but no other lesions amenable for intervention  BMP Latest Ref Rng & Units 09/02/2019 07/24/2018 10/27/2017  Glucose 70 - 99 mg/dL 81 106(H) 97  BUN 6 - 20 mg/dL 14 14 19   Creatinine 0.61 - 1.24 mg/dL 0.81 0.79 0.90  Sodium 135 - 145 mmol/L 138 138 139  Potassium 3.5 - 5.1 mmol/L 4.2 4.0 3.7  Chloride 98 - 111 mmol/L 107 106 103  CO2 22 - 32 mmol/L 25 25 27   Calcium 8.9 - 10.3 mg/dL 8.8(L) 8.8(L) 9.0     ASSESSMENT  Patient is well appearing in no acute distress. Denies shortness of breath / edema. He endorses some right leg muscle pain that has been going on for about 6 months. Not associated with injury. He is not on a statin. Denies cramping. Unlikely to be medication related. He reports taking his medications daily as prescribed. Denies adverse effects of therapy / missed doses.   PLAN  1). HFrEF -GDMT with losartan 50 mg daily, metoprolol succinate 50 mg daily -Fluid management with furosemide 40 mg daily + potassium 10 mEq daily -Experienced cough with Entresto -Previously on spironolactone but unclear why this medication was discontinued -Reports weighing himself weekly. Advised patient to weigh daily -Continue effort to reduce dietary sodium intake -Discussed with provider - no changes to medication regimen today. Future considerations include: titration of current medications, adding dapagliflozin or spironolactone  2). Hypertension -Normotensive today -Antihypertensive regimen includes losartan 50 mg daily, metoprolol succinate 50 mg daily, furosemide 40 mg daily -Endorses occasional, brief dizziness  -Does not check BP at home  3). Coronary artery disease -Aspirin 81 mg daily -Does not appear that he was ever started on a statin -Unable to find previous lipid panel in chart  4). Tobacco use disorder -Currently smoking 1 PPD -Reports he has quit in the past x 1 year (cold [place]) -Denies previous trials of NRT or other medications for cessation -Counseled patient on [employer]  Time spent: 15 minutes  [facility] Resident 55/21/2021 11:58 AM    Current Outpatient Medications:  .  aspirin 81 MG EC tablet, Take 1 tablet (81 mg total) by mouth daily., Disp: 30 tablet, Rfl: 2 .  furosemide (LASIX) 40 MG tablet, Take 1 tablet by mouth once daily, Disp: 90 tablet, Rfl: 3 .  losartan (COZAAR) 50 MG tablet, Take 1 tablet (50 mg total) by  mouth daily., Disp: 30 tablet, Rfl: 5 .  metoprolol succinate (TOPROL-XL) 50 MG 24 hr tablet, Take 1 tablet (50 mg total) by mouth daily. Take with or immediately following a meal., Disp: 90 tablet, Rfl: 3 .  potassium chloride (KLOR-CON) 10 MEQ tablet, Take 1 tablet (10 mEq total) by mouth daily., Disp: 90 tablet, Rfl: 3   COUNSELING POINTS/CLINICAL PEARLS  Metoprolol Succinate (Goal: 200 mg once daily) Warn patient to avoid activities requiring mental alertness or coordination until drug effects are realized, as drug may cause dizziness. Tell patient planning major surgery with anesthesia to alert physician that drug is being used, as drug impairs ability of heart to respond to reflex adrenergic stimuli. Drug may cause diarrhea, fatigue, headache, or depression. Advise diabetic patient to carefully monitor blood glucose as drug may mask symptoms of hypoglycemia. Patient should take extended-release tablet with or immediately following meals. Counsel patient against sudden discontinuation of drug, as this may precipitate hypertension, angina, or myocardial infarction. In the event of a missed dose, counsel patient to skip the missed dose and maintain a regular dosing schedule. Losartan (Goal: 150 mg once daily)  Warn male patient to avoid pregnancy and to report a pregnancy that occurs during therapy.  Side effects may include dizziness, upper respiratory infection, nasal congestion, and back pain.  Warn patient to avoid use of potassium supplements or potassium-containing salt substitutes unless they consult healthcare provider. Furosemide  Drug causes sun-sensitivity. Advise patient to use sunscreen and avoid tanning beds. Patient should avoid activities requiring coordination until drug effects are realized, as drug may cause dizziness, vertigo, or blurred vision. This drug may cause hyperglycemia, hyperuricemia, constipation, diarrhea, loss of appetite, nausea, vomiting, purpuric  disorder, cramps, spasticity, asthenia, headache, paresthesia, or scaling eczema. Instruct patient to report unusual bleeding/bruising or signs/symptoms of hypotension, infection, pancreatitis, or ototoxicity (tinnitus, hearing impairment). Advise patient to report signs/symptoms of a severe skin reactions (flu-like symptoms, spreading red rash, or skin/mucous membrane blistering) or erythema multiforme. Instruct patient to eat high-potassium foods during drug therapy, as directed by healthcare professional.  Patient should not drink alcohol while taking this drug.  DRUGS TO AVOID IN HEART FAILURE  Drug or Class Mechanism  Analgesics . NSAIDs . COX-2 inhibitors . Glucocorticoids  Sodium and water retention, increased systemic vascular resistance, decreased response to diuretics   Diabetes Medications . Metformin . Thiazolidinediones o Rosiglitazone (Avandia) o Pioglitazone (Actos) . DPP4 Inhibitors o Saxagliptin (Onglyza) o Sitagliptin (Januvia)   Lactic acidosis Possible calcium channel blockade   Unknown  Antiarrhythmics . Class I  o Flecainide o Disopyramide . Class III o Sotalol . Other o Dronedarone  Negative inotrope, proarrhythmic   Proarrhythmic, beta blockade  Negative inotrope  Antihypertensives . Alpha Blockers o Doxazosin . Calcium Channel Blockers o Diltiazem o Verapamil o Nifedipine . Central Alpha Adrenergics o Moxonidine . Peripheral Vasodilators o Minoxidil  Increases renin and aldosterone  Negative inotrope    Possible sympathetic withdrawal  Unknown  Anti-infective . Itraconazole . Amphotericin B  Negative inotrope Unknown  Hematologic . Anagrelide . Cilostazol  Possible inhibition of PD IV Inhibition of PD III causing arrhythmias  Neurologic/Psychiatric . Stimulants . Anti-Seizure  Drugs o Carbamazepine o Pregabalin . Antidepressants o Tricyclics o Citalopram . Parkinsons o Bromocriptine o Pergolide o Pramipexole . Antipsychotics o Clozapine . Antimigraine o Ergotamine o Methysergide . Appetite suppressants . Bipolar o Lithium  Peripheral alpha and beta agonist activity  Negative inotrope and chronotrope Calcium channel blockade  Negative inotrope, proarrhythmic Dose-dependent QT prolongation  Excessive serotonin activity/valvular damage Excessive serotonin activity/valvular damage Unknown  IgE mediated hypersensitivy, calcium channel blockade  Excessive serotonin activity/valvular damage Excessive serotonin activity/valvular damage Valvular damage  Direct myofibrillar degeneration, adrenergic stimulation  Antimalarials . Chloroquine . Hydroxychloroquine Intracellular inhibition of lysosomal enzymes  Urologic Agents . Alpha Blockers o Doxazosin o Prazosin o Tamsulosin o Terazosin  Increased renin and aldosterone  Adapted from Page RL, et al. "Drugs That May Cause or Exacerbate Heart Failure: A Scientific Statement from the [place]." Circulation 2016; 570:V77-L39. DOI: 10.1161/CIR.0000000000000426   MEDICATION ADHERENCES TIPS AND STRATEGIES 1. Taking medication as prescribed improves patient outcomes in heart failure (reduces hospitalizations, improves symptoms, increases survival) 2. Side effects of medications can be managed by decreasing doses, switching agents, stopping drugs, or adding additional therapy. Please let someone in the [facility] know if you have having bothersome side effects so we can modify your regimen. Do not alter your medication regimen without talking to [place].  3. Medication reminders can help patients remember to take drugs on time. If you are missing or forgetting doses you can try linking behaviors, using pill boxes, or an electronic reminder like an alarm on your phone or an app. Some  people can also get automated phone calls as medication reminders.

## 2020-02-23 ENCOUNTER — Ambulatory Visit
Admission: EM | Admit: 2020-02-23 | Discharge: 2020-02-23 | Disposition: A | Discharge status: Home / Self Care | Payer: Self-pay | Attending: Family Medicine | Admitting: Family Medicine

## 2020-02-23 ENCOUNTER — Encounter: Payer: Self-pay | Admitting: Emergency Medicine

## 2020-02-23 ENCOUNTER — Other Ambulatory Visit: Payer: Self-pay

## 2020-02-23 DIAGNOSIS — J069 Acute upper respiratory infection, unspecified: Secondary | ICD-10-CM | POA: Insufficient documentation

## 2020-02-23 DIAGNOSIS — Z20822 Contact with and (suspected) exposure to covid-19: Secondary | ICD-10-CM | POA: Insufficient documentation

## 2020-02-23 LAB — SARS CORONAVIRUS 2 (TAT 6-24 HRS): SARS Coronavirus 2: NEGATIVE

## 2020-02-23 MED ORDER — BENZONATATE 200 MG PO CAPS: 200.0000 mg | ORAL_CAPSULE | Freq: Three times a day (TID) | ORAL | 0 refills | Status: DC | PRN

## 2020-02-23 NOTE — ED Triage Notes (Signed)
Patient in today c/o cough, chest congestion and runny nose x 2 days. Patient denies fever. Patient took OTC Mucinex this morning. Patient has not had the covid vaccine.

## 2020-02-23 NOTE — Discharge Instructions (Signed)
Results available in 24 hours.  Medication as prescribed.  Stay home.  Take care  Dr. Adriana Simas

## 2020-02-23 NOTE — ED Provider Notes (Signed)
[facility]    CSN: [id number] Arrival date & time: 02/23/20  1124  History   Chief Complaint Chief Complaint  Patient presents with  . Cough  . chest congestion   HPI  54 year old male presents with cough, congestion, runny nose.  2-day history of the above symptoms.  No fever.  He has taken over-the-counter Mucinex without resolution.  Has not been vaccinated against COVID-19.  No reported sick contacts.  No relieving factors.  No other associated symptoms.  No other complaints.  Past Medical History:  Diagnosis Date  . CAD (coronary artery disease)   . CHF (congestive heart failure) ([facility])   . GERD (gastroesophageal reflux disease)   . Hypertension   . MI, old   . Sleep apnea     Patient Active Problem List   Diagnosis Date Noted  . CAP (community acquired pneumonia) 07/24/2018  . Bilateral recurrent inguinal hernia without obstruction or gangrene   . Acute on chronic systolic heart failure ([facility]) 08/27/2016  . HTN (hypertension) 11/02/2015  . Tobacco abuse 11/02/2015  . Hand joint pain 11/02/2015  . CHF (congestive heart failure) ([facility]) 10/15/2015    Past Surgical History:  Procedure Laterality Date  . CARDIAC CATHETERIZATION Right 10/16/2015   Procedure: Left Heart Cath and Coronary Angiography;  Surgeon: Laurier Nancy, MD;  Location: [facility] INVASIVE CV LAB;  Service: Cardiovascular;  Laterality: Right;  . CORONARY ANGIOPLASTY WITH STENT PLACEMENT  approx 2 years ago  . HERNIA REPAIR Left 11/29/2016   [facility]  . PARTIAL NEPHRECTOMY Left   . SPLENECTOMY         Home Medications    Prior to Admission medications   Medication Sig Start Date End Date Taking? Authorizing Provider  aspirin 81 MG EC tablet Take 1 tablet (81 mg total) by mouth daily. 08/29/16  Yes Houston Siren, MD  furosemide (LASIX) 40 MG tablet Take 1 tablet by mouth once daily 03/07/19  Yes Hackney, Inetta Fermo A, FNP  losartan (COZAAR) 50 MG tablet Take 1 tablet (50 mg total) by mouth daily.  10/08/19 02/23/20 Yes Clarisa Kindred A, FNP  metoprolol succinate (TOPROL-XL) 50 MG 24 hr tablet Take 1 tablet (50 mg total) by mouth daily. Take with or immediately following a meal. 09/02/19 02/23/20 Yes Hackney, Inetta Fermo A, FNP  potassium chloride (KLOR-CON) 10 MEQ tablet Take 1 tablet (10 mEq total) by mouth daily. 04/23/19  Yes Hackney, Tina A, FNP  benzonatate (TESSALON) 200 MG capsule Take 1 capsule (200 mg total) by mouth 3 (three) times daily as needed for cough. 02/23/20   Tommie Sams, DO    Family History Family History  Problem Relation Age of Onset  . Heart failure Mother   . Hypertension Mother   . Other Mother        covid pneumonia  . Heart attack Father 57  . Diabetes Father   . Diabetes Mellitus II Brother     Social History Social History   Tobacco Use  . Smoking status: Current Every Day Smoker    Packs/day: 1.00    Types: Cigarettes  . Smokeless tobacco: Never Used  Vaping Use  . Vaping Use: Never used  Substance Use Topics  . Alcohol use: No    Alcohol/week: 0.0 standard drinks  . Drug use: Not Currently    Comment: percocet     Allergies   Entresto [sacubitril-valsartan]   Review of Systems Review of Systems  Constitutional: Negative for fever.  HENT: Positive for congestion and  rhinorrhea.   Respiratory: Positive for cough.    Physical Exam Triage Vital Signs ED Triage Vitals  Enc Vitals Group     BP 02/23/20 1220 (!) 141/98     Pulse Rate 02/23/20 1220 84     Resp 02/23/20 1220 18     Temp 02/23/20 1220 98.2 F (36.8 C)     Temp Source 02/23/20 1220 Oral     SpO2 02/23/20 1220 96 %     Weight 02/23/20 1222 220 lb (99.8 kg)     Height 02/23/20 1222 5\' 10"  (1.778 m)     Head Circumference --      Peak Flow --      Pain Score 02/23/20 1220 0     Pain Loc --      Pain Edu? --      Excl. in GC? --    Updated Vital Signs BP (!) 141/98 (BP Location: Left Arm)   Pulse 84   Temp 98.2 F (36.8 C) (Oral)   Resp 18   Ht 5\' 10"  (1.778 m)   Wt  99.8 kg   SpO2 96%   BMI 31.57 kg/m   Visual Acuity Right Eye Distance:   Left Eye Distance:   Bilateral Distance:    Right Eye Near:   Left Eye Near:    Bilateral Near:     Physical Exam Vitals and nursing note reviewed.  Constitutional:      General: He is not in acute distress.    Appearance: Normal appearance. He is not ill-appearing.  HENT:     Head: Normocephalic and atraumatic.  Eyes:     General:        Right eye: No discharge.        Left eye: No discharge.     Conjunctiva/sclera: Conjunctivae normal.  Cardiovascular:     Rate and Rhythm: Normal rate and regular rhythm.  Pulmonary:     Effort: Pulmonary effort is normal.     Breath sounds: Normal breath sounds. No wheezing or rales.  Neurological:     Mental Status: He is alert.    UC Treatments / Results  Labs (all labs ordered are listed, but only abnormal results are displayed) Labs Reviewed  SARS CORONAVIRUS 2 (TAT 6-24 HRS)    EKG   Radiology No results found.  Procedures Procedures (including critical care time)  Medications Ordered in UC Medications - No data to display  Initial Impression / Assessment and Plan / UC Course  I have reviewed the triage vital signs and the nursing notes.  Pertinent labs & imaging results that were available during my care of the patient were reviewed by me and considered in my medical decision making (see chart for details).    54 year old male presents with viral URI with cough.  Awaiting Covid test results.  Tessalon Perles for cough.  Final Clinical Impressions(s) / UC Diagnoses   Final diagnoses:  Viral URI with cough  Encounter for laboratory testing for COVID-19 virus     Discharge Instructions     Results available in 24 hours.  Medication as prescribed.  Stay home.  Take care  Dr.     ED Prescriptions    Medication Sig Dispense Auth. Provider   benzonatate (TESSALON) 200 MG capsule Take 1 capsule (200 mg total) by mouth 3  (three) times daily as needed for cough. 30 capsule 53, DO     PDMP not reviewed this encounter.   Adriana Simas  G, DO 02/23/20 1350

## 2020-02-25 ENCOUNTER — Ambulatory Visit: Payer: Self-pay | Admitting: Family

## 2020-02-28 ENCOUNTER — Ambulatory Visit: Payer: Self-pay | Admitting: Family

## 2020-03-03 ENCOUNTER — Ambulatory Visit: Payer: Self-pay | Admitting: Family

## 2020-03-16 ENCOUNTER — Other Ambulatory Visit: Payer: Self-pay | Admitting: Family

## 2020-03-16 MED ORDER — FUROSEMIDE 40 MG PO TABS
40.0000 mg | ORAL_TABLET | Freq: Every day | ORAL | 3 refills | Status: DC
Start: 2020-03-16 — End: 2020-09-01

## 2020-03-21 NOTE — Progress Notes (Signed)
Patient ID: Brian Porter, male    DOB: 1966/04/13, 54 y.o.   MRN: [id number]  HPI  [name] is a 54 y/o male with a history of obstructive sleep apnea (without CPAP), CAD, HTN, GERD, MI, current tobacco use and chronic heart failure.   Echo report from 05/24/2019 reviewed and showed an EF of 30-35% along with trivial TR/PR. Echo done 10/15/15 and showed an EF of 35% along with moderate MR/TR and an elevated PA pressure of 48 mm Hg.   Had a cardiac catheterization done 10/16/15 and showed an EF of 15% along with multi-vessel disease. Recommended treatment for cardiomyopathy.   He presents today for a follow-up visit with recent treatment for bronchitis and had a negative COVID test. Patient reports he "feels like he is over that now" and has been feeling better. Patient reports walking from parking lot to clinic with no SOB. Endorses infrequent dizzy spells and states he has been smoking 1 ppd since the loss of his mother. No reported LOC or falls.   Past Medical History:  Diagnosis Date  . CAD (coronary artery disease)   . CHF (congestive heart failure) ([facility])   . GERD (gastroesophageal reflux disease)   . Hypertension   . MI, old   . Sleep apnea    Past Surgical History:  Procedure Laterality Date  . CARDIAC CATHETERIZATION Right 10/16/2015   Procedure: Left Heart Cath and Coronary Angiography;  Surgeon: Laurier Nancy, MD;  Location: [facility] INVASIVE CV LAB;  Service: Cardiovascular;  Laterality: Right;  . CORONARY ANGIOPLASTY WITH STENT PLACEMENT  approx 2 years ago  . HERNIA REPAIR Left 11/29/2016   [facility]  . PARTIAL NEPHRECTOMY Left   . SPLENECTOMY     Family History  Problem Relation Age of Onset  . Heart failure Mother   . Hypertension Mother   . Other Mother        covid pneumonia  . Heart attack Father 75  . Diabetes Father   . Diabetes Mellitus II Brother    Social History   Tobacco Use  . Smoking status: Current Every Day Smoker    Packs/day: 1.00    Types:  Cigarettes  . Smokeless tobacco: Never Used  Substance Use Topics  . Alcohol use: No    Alcohol/week: 0.0 standard drinks   Allergies  Allergen Reactions  . Entresto [Sacubitril-Valsartan] Cough   Prior to Admission medications   Medication Sig Start Date End Date Taking? Authorizing Provider  aspirin 81 MG EC tablet Take 1 tablet (81 mg total) by mouth daily. 08/29/16  Yes Sainani, Rolly Pancake, MD  benzonatate (TESSALON) 200 MG capsule Take 1 capsule (200 mg total) by mouth 3 (three) times daily as needed for cough. 02/23/20  Yes Cook, Jayce G, DO  furosemide (LASIX) 40 MG tablet Take 1 tablet (40 mg total) by mouth daily. 03/16/20  Yes Hackney, Inetta Fermo A, FNP  losartan (COZAAR) 50 MG tablet Take 1 tablet (50 mg total) by mouth daily. 10/08/19 03/22/20 Yes Clarisa Kindred A, FNP  metoprolol succinate (TOPROL-XL) 50 MG 24 hr tablet Take 1 tablet (50 mg total) by mouth daily. Take with or immediately following a meal. 09/02/19 03/22/20 Yes Hackney, Inetta Fermo A, FNP  potassium chloride (KLOR-CON) 10 MEQ tablet Take 1 tablet (10 mEq total) by mouth daily. Patient taking differently: Take 10 mEq by mouth daily. Patient reports he is taking half a pill (5mg ) daily 04/23/19  Yes 04/25/19, FNP    Review of Systems  Constitutional: Negative for appetite change, fatigue and unexpected weight change.  HENT: Negative for congestion, postnasal drip and sore throat.   Eyes: Negative.   Respiratory: Positive for cough (Recently had bronchitis). Negative for chest tightness and shortness of breath.   Cardiovascular: Negative for chest pain, palpitations and leg swelling.  Gastrointestinal: Negative for abdominal distention.  Endocrine: Negative.   Genitourinary: Negative.   Musculoskeletal: Positive for myalgias (right leg pain comes and goes). Negative for back pain.  Skin: Negative.   Allergic/Immunologic: Negative.   Neurological: Positive for dizziness (Patient reports dizzy spells at random intervals).  Negative for tremors, weakness and light-headedness.  Hematological: Negative for adenopathy. Does not bruise/bleed easily.  Psychiatric/Behavioral: Negative for dysphoric mood, sleep disturbance (sleeping on 2 pillows) and suicidal ideas. The patient is not nervous/anxious.    Vitals with BMI 03/22/2020 02/23/2020 11/19/2019  Height 5\' 10"  5\' 10"  5\' 10"   Weight 223 lbs 8 oz 220 lbs 227 lbs  BMI 32.07 31.57 32.57  Systolic 136 141  Diastolic 85 98 81  Pulse 70 84 68   Lab Results  Component Value Date   CREATININE 0.81 09/02/2019   CREATININE 0.79 07/24/2018   CREATININE 0.90 10/27/2017      Physical Exam Vitals and nursing note reviewed.  Constitutional:      Appearance: He is well-developed.  HENT:     Head: Normocephalic and atraumatic.  Neck:     Vascular: No JVD.  Cardiovascular:     Rate and Rhythm: Normal rate and regular rhythm.     Heart sounds: Normal heart sounds. No murmur heard.  No gallop.   Pulmonary:     Effort: Pulmonary effort is normal.     Breath sounds: No wheezing or rales.  Abdominal:     General: There is no distension.     Palpations: Abdomen is soft.     Tenderness: There is no abdominal tenderness.  Musculoskeletal:        General: No tenderness.     Cervical back: Normal range of motion and neck supple.     Right lower leg: No edema.     Left lower leg: No edema.  Skin:    General: Skin is warm and dry.  Neurological:     Mental Status: He is alert and oriented to person, place, and time.  Psychiatric:        Attention and Perception: Attention normal.        Mood and Affect: Mood normal.        Speech: Speech normal.        Behavior: Behavior normal.        Thought Content: Thought content normal. Thought content does not include homicidal or suicidal ideation.     Comments: Patient recently loss his mother, reports some sadness due to loss        Assessment & Plan:   1: Chronic heart failure with reduced ejection  fraction- - NYHA class I - euvolemic in appearance  - weighing daily. Reminded to call for an overnight weight gain of >2 pounds or a weekly weight gain of >5 pounds.  - weight up 2.4 pounds since he was last here 1 month ago - losartan 50mg  daily  - May increase Metoprolol at next visit and look at adding spironolactone with potential removal of potassium supplement.  - not adding salt to his food and tries to follow a 2000mg  sodium diet - BNP 07/24/2018 was 207.0  2: HTN- - BP looks  good today -Patient reports attempted to get an appointment at [facility] in a couple weeks but reports difficulty gaining apt due to COVID-19 Pandemic.   3: Tobacco use- - smokes 1 ppd of cigarettes - Encouraged patient to decrease cigarettes as much as possible in hopes of total cessation  Medication bottles were reviewed.   Return in 3 months or sooner for any questions/problems before then.

## 2020-03-22 ENCOUNTER — Ambulatory Visit: Payer: Self-pay | Attending: Family | Admitting: Family

## 2020-03-22 ENCOUNTER — Other Ambulatory Visit: Payer: Self-pay

## 2020-03-22 ENCOUNTER — Encounter: Payer: Self-pay | Admitting: Family

## 2020-03-22 VITALS — BP 136/85 | HR 70 | Resp 20 | Ht 70.0 in | Wt 223.5 lb

## 2020-03-22 DIAGNOSIS — I10 Essential (primary) hypertension: Secondary | ICD-10-CM

## 2020-03-22 DIAGNOSIS — I5022 Chronic systolic (congestive) heart failure: Secondary | ICD-10-CM | POA: Insufficient documentation

## 2020-03-22 DIAGNOSIS — I252 Old myocardial infarction: Secondary | ICD-10-CM | POA: Insufficient documentation

## 2020-03-22 DIAGNOSIS — I2510 Atherosclerotic heart disease of native coronary artery without angina pectoris: Secondary | ICD-10-CM | POA: Insufficient documentation

## 2020-03-22 DIAGNOSIS — F17210 Nicotine dependence, cigarettes, uncomplicated: Secondary | ICD-10-CM | POA: Insufficient documentation

## 2020-03-22 DIAGNOSIS — Z7901 Long term (current) use of anticoagulants: Secondary | ICD-10-CM | POA: Insufficient documentation

## 2020-03-22 DIAGNOSIS — R42 Dizziness and giddiness: Secondary | ICD-10-CM | POA: Insufficient documentation

## 2020-03-22 DIAGNOSIS — Z720 Tobacco use: Secondary | ICD-10-CM

## 2020-03-22 DIAGNOSIS — Z8249 Family history of ischemic heart disease and other diseases of the circulatory system: Secondary | ICD-10-CM | POA: Insufficient documentation

## 2020-03-22 DIAGNOSIS — Z7982 Long term (current) use of aspirin: Secondary | ICD-10-CM | POA: Insufficient documentation

## 2020-03-22 DIAGNOSIS — Z79899 Other long term (current) drug therapy: Secondary | ICD-10-CM | POA: Insufficient documentation

## 2020-03-22 DIAGNOSIS — M791 Myalgia, unspecified site: Secondary | ICD-10-CM | POA: Insufficient documentation

## 2020-03-22 DIAGNOSIS — I110 Hypertensive heart disease with heart failure: Secondary | ICD-10-CM | POA: Insufficient documentation

## 2020-05-27 ENCOUNTER — Other Ambulatory Visit: Payer: Self-pay

## 2020-05-27 ENCOUNTER — Encounter: Payer: Self-pay | Admitting: Emergency Medicine

## 2020-05-27 ENCOUNTER — Emergency Department
Admission: EM | Admit: 2020-05-27 | Discharge: 2020-05-27 | Disposition: A | Discharge status: Home / Self Care | Payer: Self-pay | Attending: Emergency Medicine | Admitting: Emergency Medicine

## 2020-05-27 ENCOUNTER — Emergency Department: Payer: Self-pay

## 2020-05-27 DIAGNOSIS — Z955 Presence of coronary angioplasty implant and graft: Secondary | ICD-10-CM | POA: Insufficient documentation

## 2020-05-27 DIAGNOSIS — R42 Dizziness and giddiness: Secondary | ICD-10-CM | POA: Insufficient documentation

## 2020-05-27 DIAGNOSIS — R202 Paresthesia of skin: Secondary | ICD-10-CM | POA: Insufficient documentation

## 2020-05-27 DIAGNOSIS — I110 Hypertensive heart disease with heart failure: Secondary | ICD-10-CM | POA: Insufficient documentation

## 2020-05-27 DIAGNOSIS — I2510 Atherosclerotic heart disease of native coronary artery without angina pectoris: Secondary | ICD-10-CM | POA: Insufficient documentation

## 2020-05-27 DIAGNOSIS — Z79899 Other long term (current) drug therapy: Secondary | ICD-10-CM | POA: Insufficient documentation

## 2020-05-27 DIAGNOSIS — I5023 Acute on chronic systolic (congestive) heart failure: Secondary | ICD-10-CM | POA: Insufficient documentation

## 2020-05-27 DIAGNOSIS — F17210 Nicotine dependence, cigarettes, uncomplicated: Secondary | ICD-10-CM | POA: Insufficient documentation

## 2020-05-27 DIAGNOSIS — Z7982 Long term (current) use of aspirin: Secondary | ICD-10-CM | POA: Insufficient documentation

## 2020-05-27 LAB — BASIC METABOLIC PANEL
Anion gap: 8 (ref 5–15)
BUN: 20 mg/dL (ref 6–20)
CO2: 26 mmol/L (ref 22–32)
Calcium: 9 mg/dL (ref 8.9–10.3)
Chloride: 107 mmol/L (ref 98–111)
Creatinine, Ser: 0.89 mg/dL (ref 0.61–1.24)
GFR, Estimated: >60 mL/min (ref >60)
Glucose, Bld: 109 mg/dL — ABNORMAL HIGH (ref 70–99)
Potassium: 4.3 mmol/L (ref 3.5–5.1)
Sodium: 141 mmol/L (ref 135–145)

## 2020-05-27 LAB — CBC
HCT: 49.8 % (ref 39.0–52.0)
Hemoglobin: 16.6 g/dL (ref 13.0–17.0)
MCH: 32.1 pg (ref 26.0–34.0)
MCHC: 33.3 g/dL (ref 30.0–36.0)
MCV: 96.3 fL (ref 80.0–100.0)
Platelets: 358 10*3/uL (ref 150–400)
RBC: 5.17 MIL/uL (ref 4.22–5.81)
RDW: 13.3 % (ref 11.5–15.5)
WBC: 12.3 10*3/uL — ABNORMAL HIGH (ref 4.0–10.5)
nRBC: 0 % (ref 0.0–0.2)

## 2020-05-27 IMAGING — CT CT HEAD W/O CM
3 of 4 series · 14 of 47 positions shown, 16 images · non-contrast
Comparison: None.

CLINICAL DATA: Dizziness and right-sided numbness.

EXAM:
CT HEAD WITHOUT CONTRAST
TECHNIQUE: Contiguous axial images were obtained from the base of the skull
through the vertex without intravenous contrast.

[Series 3: ax head wo · axial · 0.35mm/px · z∈[-118,+8]mm · 8 of 33 slices shown, 10 images]
[im 3/33  brain]
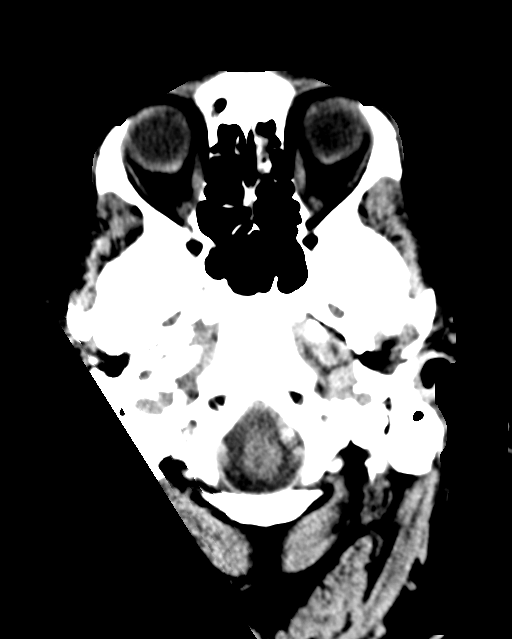
[im 3/33  bone]
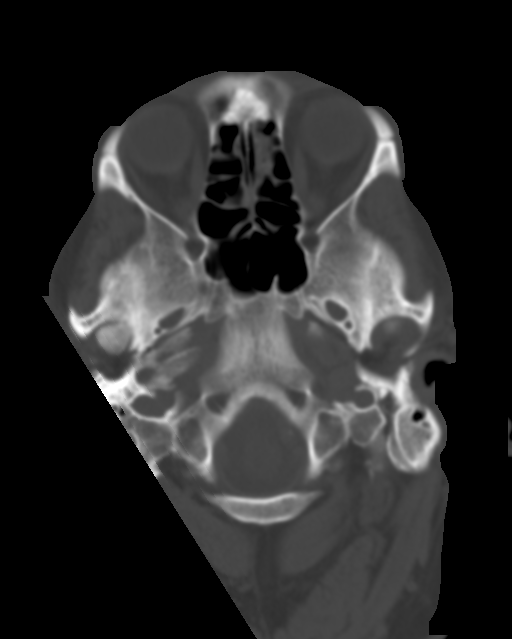
[im 7/33  brain]
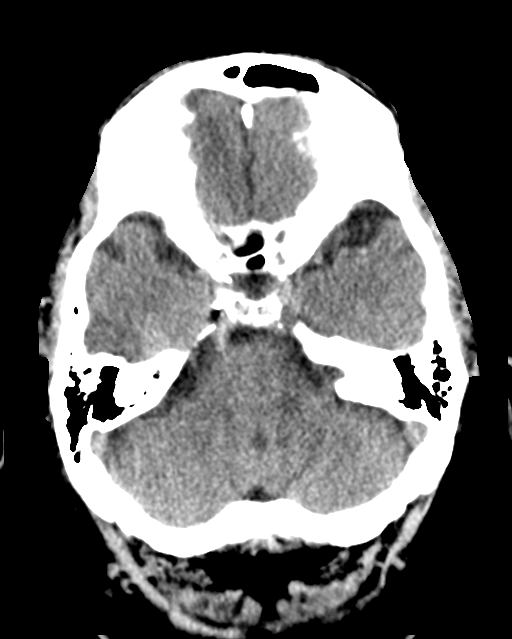
[im 12/33  brain]
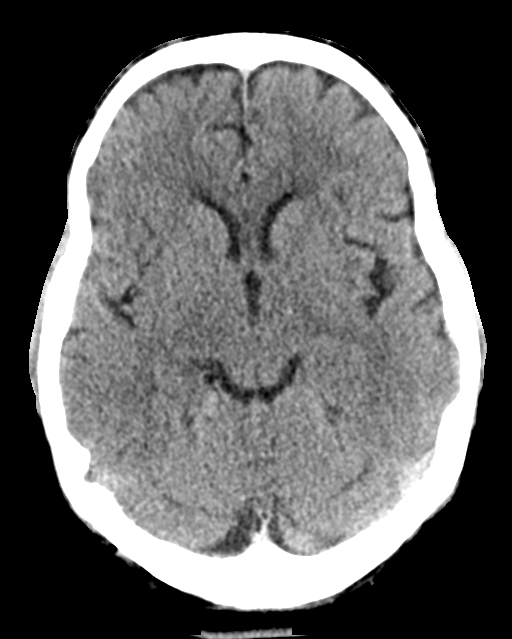
[im 14/33  brain]
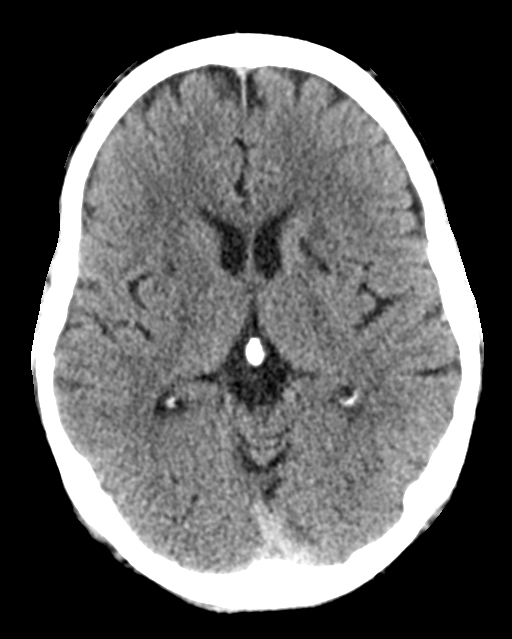
[im 19/33  brain]
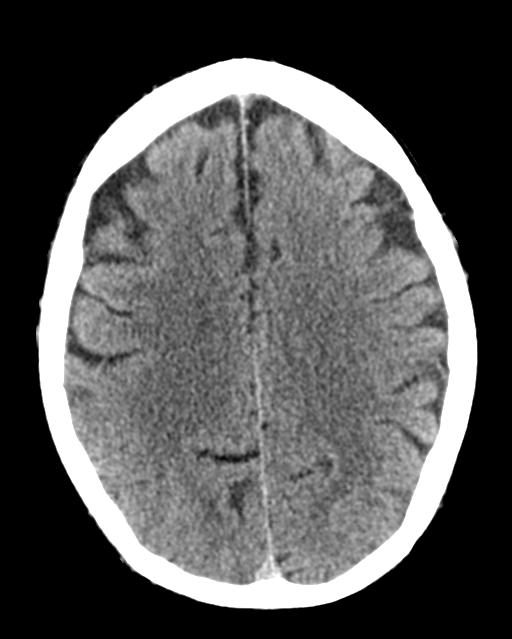
[im 19/33  bone]
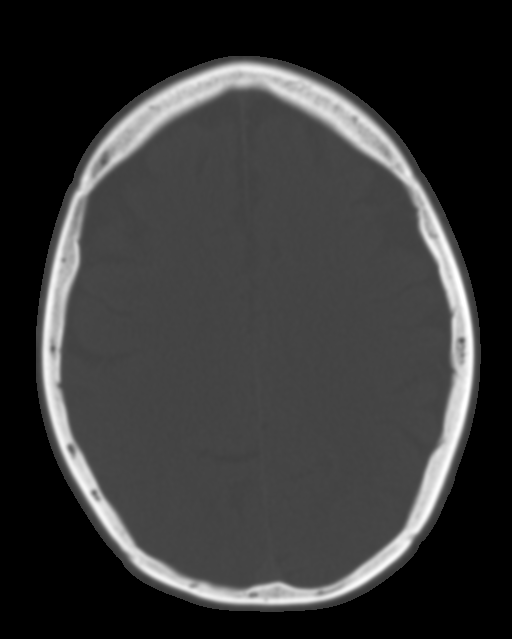
[im 21/33  brain]
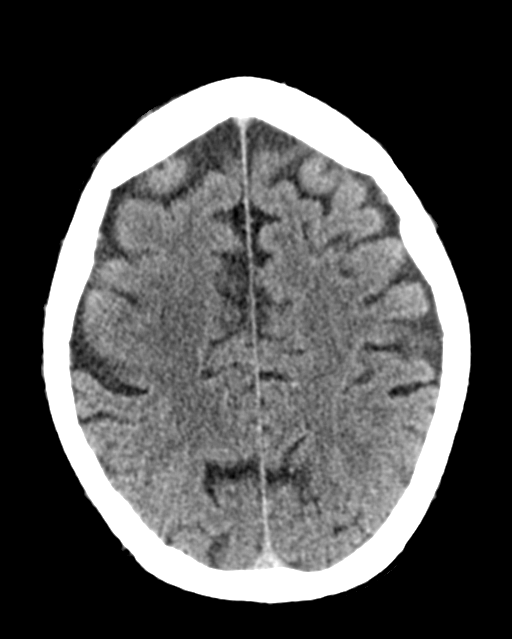
[im 26/33  brain]
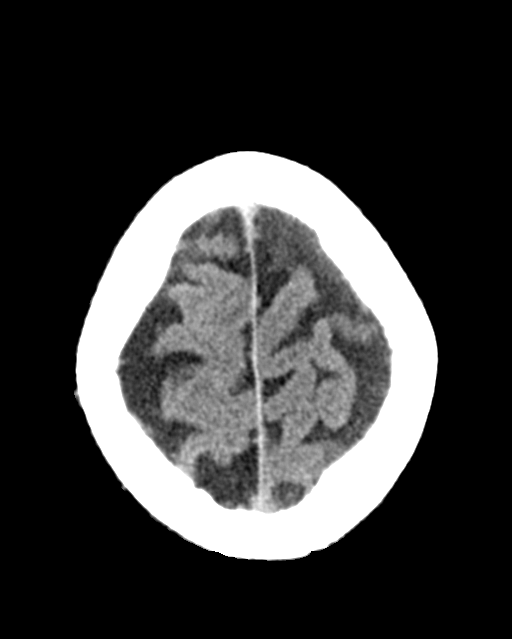
[im 30/33  brain]
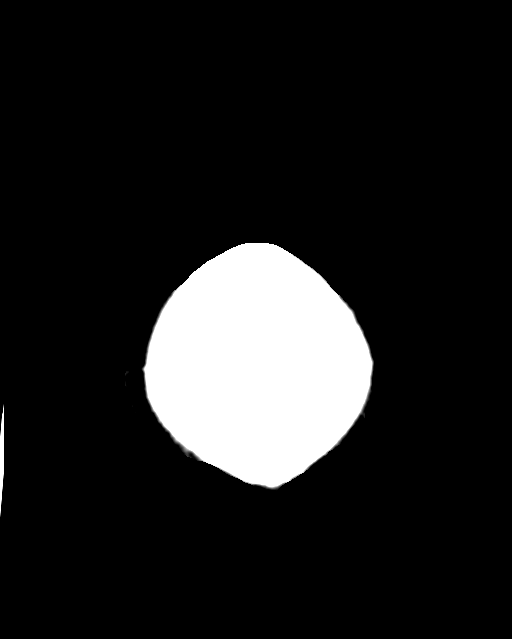

[Series 4: coronal soft tissue · coronal · 0.32mm/px · 3 of 72 slices shown]
[im 24/72  brain]
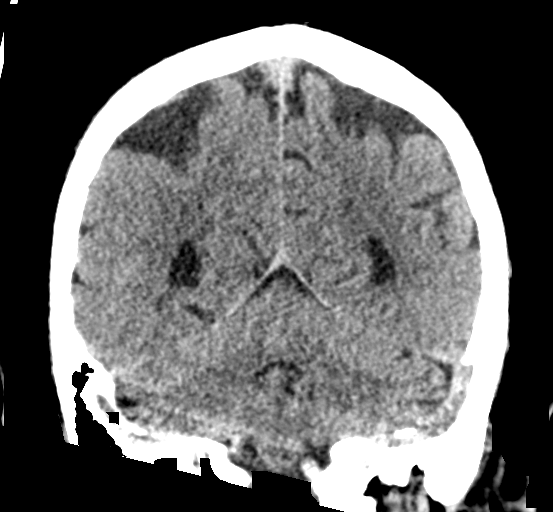
[im 32/72  brain]
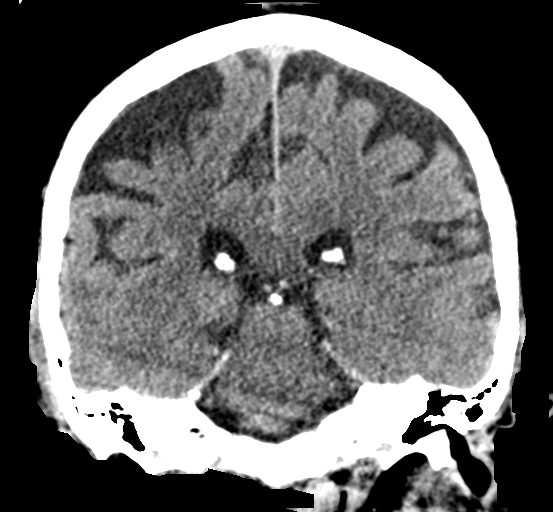
[im 40/72  brain]
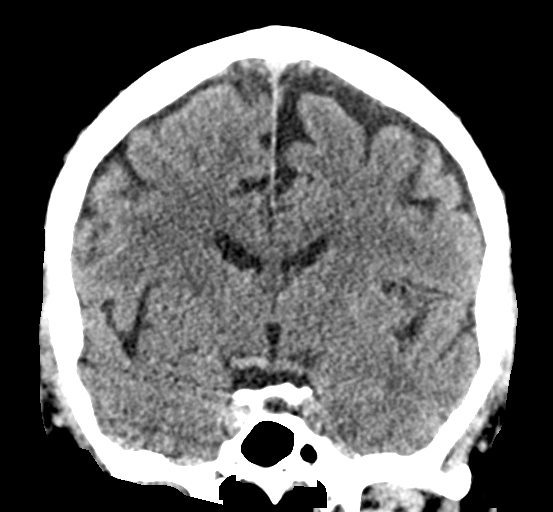

[Series 5: sagittal soft tissue · sagittal · 0.32mm/px · 3 of 60 slices shown]
[im 26/60  brain]
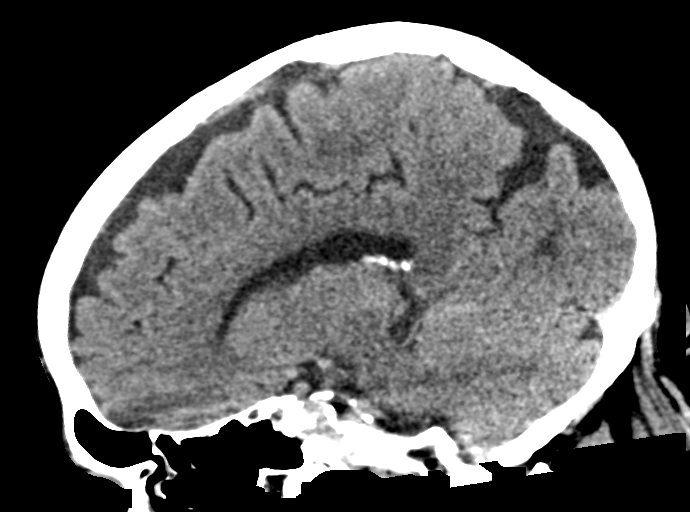
[im 30/60  brain]
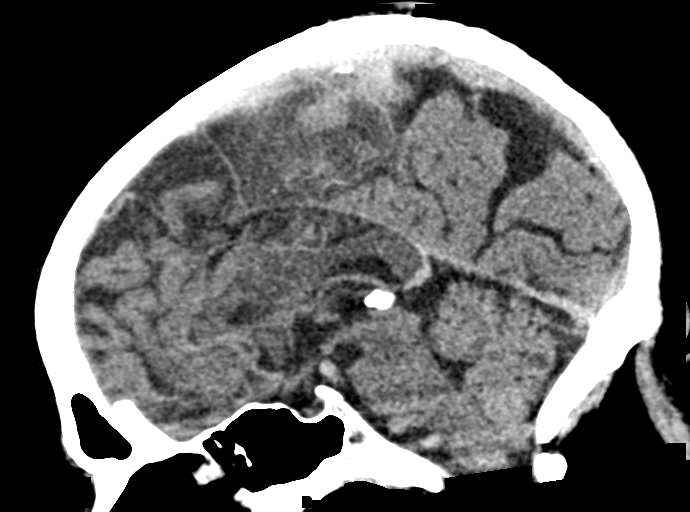
[im 35/60  brain]
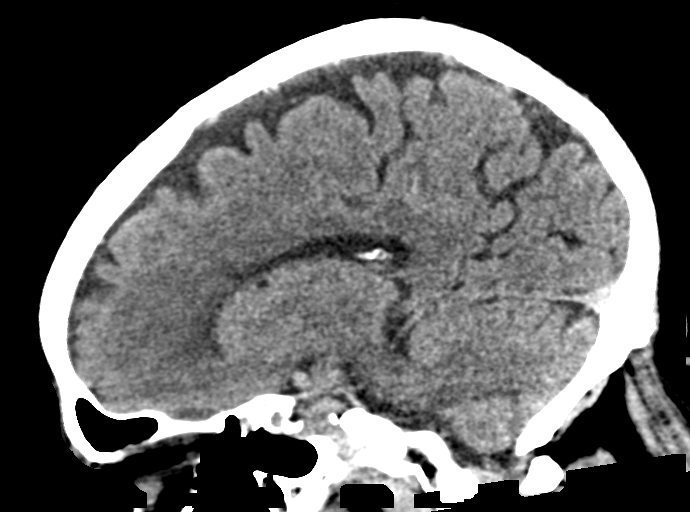

[14 of 47 positions shown; findings below may reference images not displayed]

FINDINGS: Brain: No evidence of acute infarction, hemorrhage, hydrocephalus,
extra-axial collection or mass lesion/mass effect. A chronic lacunar
infarct is seen in the left caudate and putamen. Periventricular
white matter hypoattenuation likely represents chronic small vessel
ischemic disease.

Vascular: There are vascular calcifications in the carotid siphons.

Skull: Normal. Negative for fracture or focal lesion.

Sinuses/Orbits: No acute finding.

Other: None.
IMPRESSION: 1. No acute intracranial process.

## 2020-05-27 NOTE — ED Provider Notes (Signed)
[facility] Emergency Department Provider Note   ____________________________________________   I have reviewed the triage vital signs and the nursing notes.   HISTORY  Chief Complaint Right side fell asleep  History limited by: Not Limited   HPI Brian Porter is a 54 y.o. male who presents to the emergency department today because of concerns for episode of paresthesias that occurred 3 days ago.  The patient states that he had 2 episodes 3 days ago where he felt like his whole right side fell asleep.  He states that the sensation started in his right cheek and then spread down to his right arm and right leg.  He describes it as pins-and-needles.  They did first episode lasted a couple minutes and then the second 1 lasted 15 to 30 minutes.  States he had some associated lightheadedness and dizziness.  Somewhat unclear why the patient delayed seeking care except sounds like he talk to his brother last night who recommended he be evaluated.  Patient denies any recent head trauma.  Denies similar symptoms in the past.  Records reviewed. Per medical record review patient has a history of CAD, CHF, GERD.  Past Medical History:  Diagnosis Date  . CAD (coronary artery disease)   . CHF (congestive heart failure) (HCC)   . GERD (gastroesophageal reflux disease)   . Hypertension   . MI, old   . Sleep apnea     Patient Active Problem List   Diagnosis Date Noted  . CAP (community acquired pneumonia) 07/24/2018  . Bilateral recurrent inguinal hernia without obstruction or gangrene   . Acute on chronic systolic heart failure (HCC) 08/27/2016  . HTN (hypertension) 11/02/2015  . Tobacco abuse 11/02/2015  . Hand joint pain 11/02/2015  . CHF (congestive heart failure) (HCC) 10/15/2015    Past Surgical History:  Procedure Laterality Date  . CARDIAC CATHETERIZATION Right 10/16/2015   Procedure: Left Heart Cath and Coronary Angiography;  Surgeon: Laurier Nancy, MD;   Location: [facility] INVASIVE CV LAB;  Service: Cardiovascular;  Laterality: Right;  . CORONARY ANGIOPLASTY WITH STENT PLACEMENT  approx 2 years ago  . HERNIA REPAIR Left 11/29/2016   [facility]  . PARTIAL NEPHRECTOMY Left   . SPLENECTOMY      Prior to Admission medications   Medication Sig Start Date End Date Taking? Authorizing Provider  aspirin 81 MG EC tablet Take 1 tablet (81 mg total) by mouth daily. 08/29/16   Houston Siren, MD  benzonatate (TESSALON) 200 MG capsule Take 1 capsule (200 mg total) by mouth 3 (three) times daily as needed for cough. 02/23/20   Tommie Sams, DO  furosemide (LASIX) 40 MG tablet Take 1 tablet (40 mg total) by mouth daily. 03/16/20   Delma Freeze, FNP  losartan (COZAAR) 50 MG tablet Take 1 tablet (50 mg total) by mouth daily. 10/08/19 03/22/20  Delma Freeze, FNP  metoprolol succinate (TOPROL-XL) 50 MG 24 hr tablet Take 1 tablet (50 mg total) by mouth daily. Take with or immediately following a meal. 09/02/19 03/22/20  Clarisa Kindred A, FNP  potassium chloride (KLOR-CON) 10 MEQ tablet Take 1 tablet (10 mEq total) by mouth daily. Patient taking differently: Take 10 mEq by mouth daily. Patient reports he is taking half a pill (5mg ) daily 04/23/19   04/25/19, FNP    Allergies Delma Freeze [sacubitril-valsartan]  Family History  Problem Relation Age of Onset  . Heart failure Mother   . Hypertension Mother   . Other  Mother        covid pneumonia  . Heart attack Father 27  . Diabetes Father   . Diabetes Mellitus II Brother     Social History Social History   Tobacco Use  . Smoking status: Current Every Day Smoker    Packs/day: 1.00    Types: Cigarettes  . Smokeless tobacco: Never Used  Vaping Use  . Vaping Use: Never used  Substance Use Topics  . Alcohol use: No    Alcohol/week: 0.0 standard drinks  . Drug use: Not Currently    Comment: percocet    Review of Systems Constitutional: No fever/chills Eyes: No visual changes. ENT: No sore  throat. Cardiovascular: Denies chest pain. Respiratory: Denies shortness of breath. Gastrointestinal: No abdominal pain.  No nausea, no vomiting.  No diarrhea.   Genitourinary: Negative for dysuria. Musculoskeletal: Negative for back pain. Skin: Negative for rash. Neurological: Lightheadedness, dizziness. Paresthesias to the right side. ____________________________________________   PHYSICAL EXAM:  VITAL SIGNS: ED Triage Vitals  Enc Vitals Group     BP 05/27/20 1558 (!) 142/73     Pulse Rate 05/27/20 1558 61     Resp 05/27/20 1558 20     Temp 05/27/20 1558 97.8 F (36.6 C)     Temp Source 05/27/20 1558 Oral     SpO2 05/27/20 1558 93 %     Weight 05/27/20 1559 220 lb (99.8 kg)     Height 05/27/20 1559 5\' 9"  (1.753 m)     Head Circumference --      Peak Flow --      Pain Score 05/27/20 1558 5   Constitutional: Alert and oriented.  Eyes: Conjunctivae are normal.  ENT      Head: Normocephalic and atraumatic.      Nose: No congestion/rhinnorhea.      Mouth/Throat: Mucous membranes are moist.      Neck: No stridor. Hematological/Lymphatic/Immunilogical: No cervical lymphadenopathy. Cardiovascular: Normal rate, regular rhythm.  No murmurs, rubs, or gallops.  Respiratory: Normal respiratory effort without tachypnea nor retractions. Breath sounds are clear and equal bilaterally. No wheezes/rales/rhonchi. Gastrointestinal: Soft and non tender. No rebound. No guarding.  Genitourinary: Deferred Musculoskeletal: Normal range of motion in all extremities. No lower extremity edema. Neurologic:  Normal speech and language. EOMI. PERRL. Strength 5/5 in upper and lower extremities. Sensation intact. Finger to nose normal. No gross focal neurologic deficits are appreciated.  Skin:  Skin is warm, dry and intact. No rash noted. Psychiatric: Mood and affect are normal. Speech and behavior are normal. Patient exhibits appropriate insight and  judgment.  ____________________________________________    LABS (pertinent positives/negatives)  CBC wbc 12.3, hgb 16.6, plt 358 BMP wnl except glu 109  ____________________________________________   EKG  I, 05/29/20, attending physician, personally viewed and interpreted this EKG  EKG Time: 1553 Rate: 62 Rhythm: normal sinus rhythm Axis: right superior axis deviation Intervals: qtc 412 QRS: narrow, q waves V1, v2 ST changes: no st elevation Impression: abnormal ekg  ____________________________________________    RADIOLOGY  CT head No acute intracranial process   ____________________________________________   PROCEDURES  Procedures  ____________________________________________   INITIAL IMPRESSION / ASSESSMENT AND PLAN / ED COURSE  Pertinent labs & imaging results that were available during my care of the patient were reviewed by me and considered in my medical decision making (see chart for details).   Patient presented to the emergency department today because of concern for a couple episodes of right sided paraesthesias that occurred three days ago.  EKG, CT head and blood work today without any significant findings, very mild leukocytosis. Physical exam is benign without any neuro deficits. Unclear etiology of the patient's symptoms at this time. Doubt stroke given lack of continued symptoms. Did discuss with patient possible TIA however given extent of symptoms (right cheek to right foot) do have lower suspicion for true intracranial cause. Discussed with patient that work up is somewhat limited given length of time between symptoms and evaluation. I do think patient would benefit from further outpatient work up and I did discuss this with the patient. Also discussed return precautions.   ___________________________________________   FINAL CLINICAL IMPRESSION(S) / ED DIAGNOSES  Final diagnoses:  Paresthesias     Note: This dictation was  prepared with Dragon dictation. Any transcriptional errors that result from this process are unintentional     Phineas Semen, MD 05/27/20 1921

## 2020-05-27 NOTE — ED Triage Notes (Signed)
First RN note: Pt to ED via POV with c/o R sided numbness x 20-30 minutes, pt states first episode happened Wednesday, had repeat episode Thursday and Friday. Pt denies numbness numbness at this time, c/o feeling light-headed. Pt offered wheelchair, pt refused.

## 2020-05-27 NOTE — ED Notes (Signed)
Pt states that the day before Thanksgiving he had numbness on the right side of his face that went down to his leg. Pt states that this last for about 30-45 minutes. Pt states that it has since resolved. Pt is also c/o lower back pain on the right side

## 2020-05-27 NOTE — Discharge Instructions (Addendum)
Please seek medical attention for any high fevers, chest pain, shortness of breath, change in behavior, persistent vomiting, bloody stool or any other new or concerning symptoms.  

## 2020-05-27 NOTE — ED Triage Notes (Signed)
Pt reports on Wednesday he had a couple of episodes of numbness to his right side. Pt reports he was numb from the right side of his face all the way down to his foot. Pt reports he has not had any episodes since then but he has felt a little weak and light headed. No facial dropping noted, grips equal bilaterally

## 2020-06-19 ENCOUNTER — Ambulatory Visit: Payer: Self-pay | Admitting: Family

## 2020-06-28 ENCOUNTER — Emergency Department
Admission: EM | Admit: 2020-06-28 | Discharge: 2020-06-28 | Disposition: A | Discharge status: Home / Self Care | Payer: HRSA Program | Attending: Emergency Medicine | Admitting: Emergency Medicine

## 2020-06-28 ENCOUNTER — Other Ambulatory Visit: Payer: Self-pay

## 2020-06-28 ENCOUNTER — Emergency Department: Payer: HRSA Program

## 2020-06-28 ENCOUNTER — Encounter: Payer: Self-pay | Admitting: *Deleted

## 2020-06-28 DIAGNOSIS — I110 Hypertensive heart disease with heart failure: Secondary | ICD-10-CM | POA: Diagnosis not present

## 2020-06-28 DIAGNOSIS — R0602 Shortness of breath: Secondary | ICD-10-CM | POA: Diagnosis present

## 2020-06-28 DIAGNOSIS — J1282 Pneumonia due to coronavirus disease 2019: Secondary | ICD-10-CM | POA: Insufficient documentation

## 2020-06-28 DIAGNOSIS — I5023 Acute on chronic systolic (congestive) heart failure: Secondary | ICD-10-CM | POA: Diagnosis not present

## 2020-06-28 DIAGNOSIS — F17210 Nicotine dependence, cigarettes, uncomplicated: Secondary | ICD-10-CM | POA: Insufficient documentation

## 2020-06-28 DIAGNOSIS — I2510 Atherosclerotic heart disease of native coronary artery without angina pectoris: Secondary | ICD-10-CM | POA: Diagnosis not present

## 2020-06-28 DIAGNOSIS — Z7982 Long term (current) use of aspirin: Secondary | ICD-10-CM | POA: Diagnosis not present

## 2020-06-28 DIAGNOSIS — U071 COVID-19: Secondary | ICD-10-CM | POA: Insufficient documentation

## 2020-06-28 DIAGNOSIS — J189 Pneumonia, unspecified organism: Secondary | ICD-10-CM

## 2020-06-28 DIAGNOSIS — Z9861 Coronary angioplasty status: Secondary | ICD-10-CM | POA: Insufficient documentation

## 2020-06-28 LAB — CBC
HCT: 48.2 % (ref 39.0–52.0)
Hemoglobin: 16.5 g/dL (ref 13.0–17.0)
MCH: 32 pg (ref 26.0–34.0)
MCHC: 34.2 g/dL (ref 30.0–36.0)
MCV: 93.6 fL (ref 80.0–100.0)
Platelets: 293 10*3/uL (ref 150–400)
RBC: 5.15 MIL/uL (ref 4.22–5.81)
RDW: 13.4 % (ref 11.5–15.5)
WBC: 10.1 10*3/uL (ref 4.0–10.5)
nRBC: 0 % (ref 0.0–0.2)

## 2020-06-28 LAB — BASIC METABOLIC PANEL
Anion gap: 7 (ref 5–15)
BUN: 24 mg/dL — ABNORMAL HIGH (ref 6–20)
CO2: 26 mmol/L (ref 22–32)
Calcium: 8.9 mg/dL (ref 8.9–10.3)
Chloride: 104 mmol/L (ref 98–111)
Creatinine, Ser: 0.97 mg/dL (ref 0.61–1.24)
GFR, Estimated: >60 mL/min (ref >60)
Glucose, Bld: 94 mg/dL (ref 70–99)
Potassium: 4.1 mmol/L (ref 3.5–5.1)
Sodium: 137 mmol/L (ref 135–145)

## 2020-06-28 LAB — RESP PANEL BY RT-PCR (FLU A&B, COVID) ARPGX2
Influenza A by PCR: NEGATIVE
Influenza B by PCR: NEGATIVE
SARS Coronavirus 2 by RT PCR: POSITIVE — AB

## 2020-06-28 LAB — PROCALCITONIN: Procalcitonin: <0.1 ng/mL

## 2020-06-28 LAB — TROPONIN I (HIGH SENSITIVITY)
Troponin I (High Sensitivity): 16 ng/L (ref <18)
Troponin I (High Sensitivity): 18 ng/L — ABNORMAL HIGH (ref <18)

## 2020-06-28 IMAGING — CR DG CHEST 2V
2 series · 2 of 2 positions shown · non-contrast
Comparison: [DATE]

CLINICAL DATA: Dyspnea

EXAM:
CHEST - 2 VIEW

[chest pa]
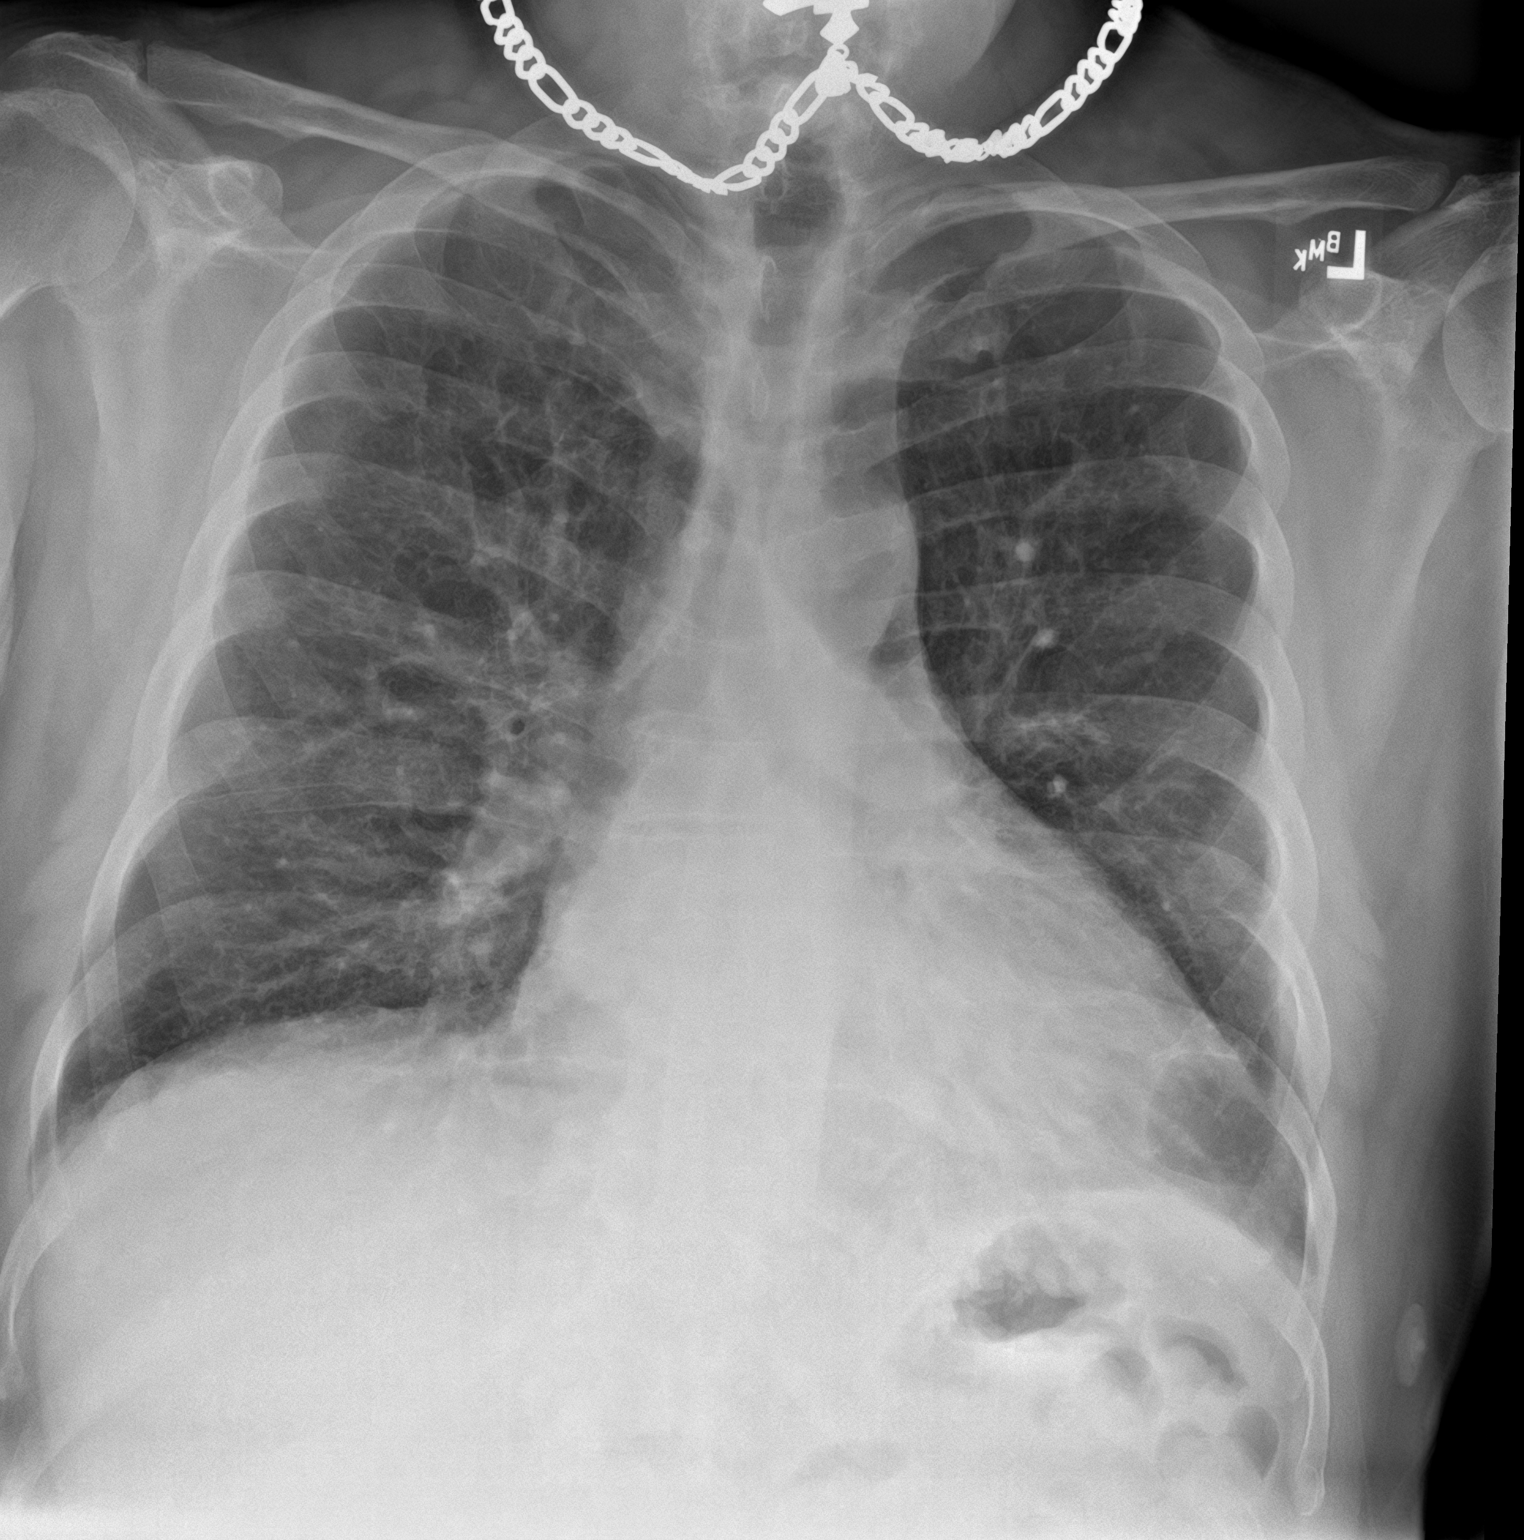

[chest lat]
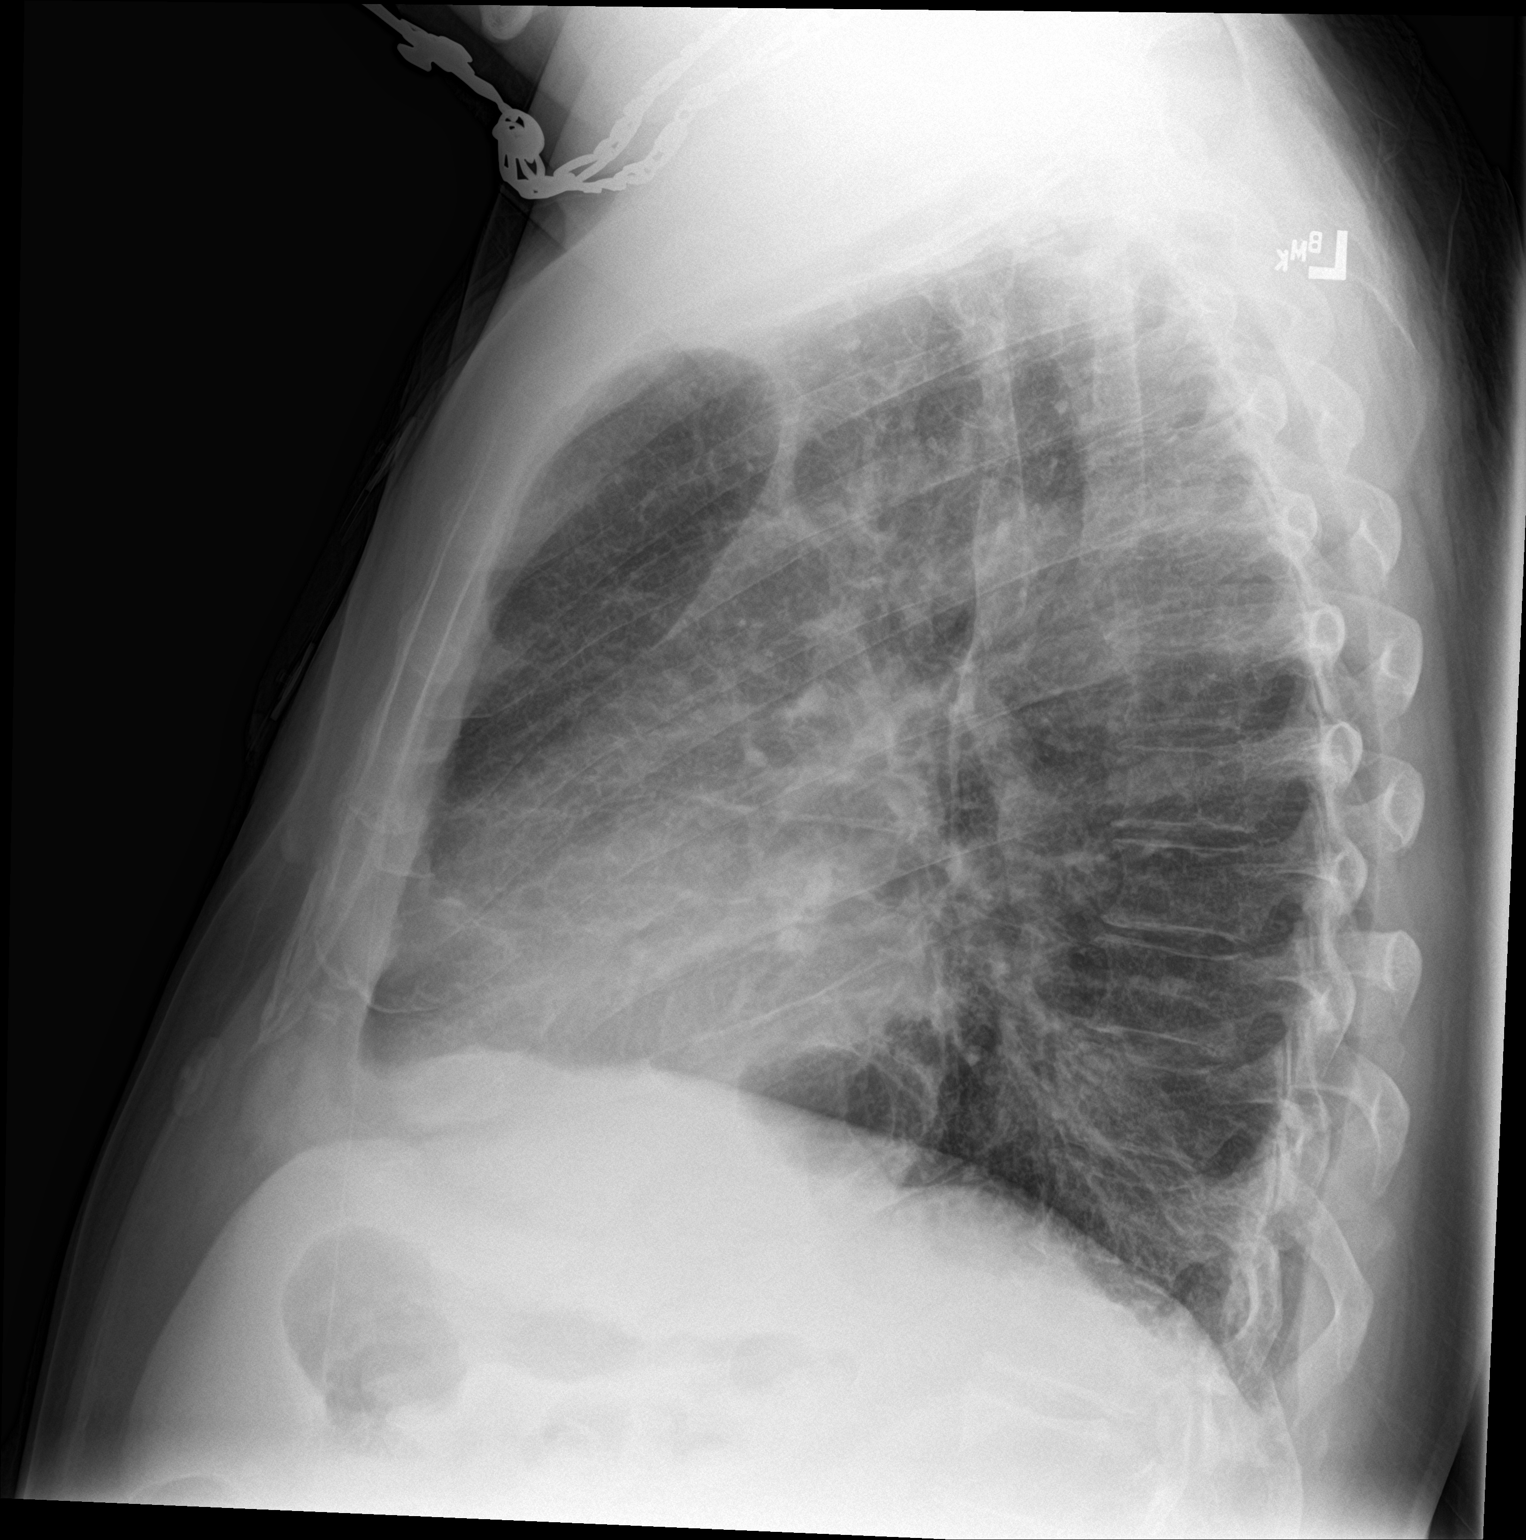

[2 of 2 positions shown; findings below may reference images not displayed]

FINDINGS: A a focal retrocardiac infiltrate is present, new from prior
examination and suspicious for changes of acute lobar pneumonia in
the appropriate clinical setting. The lungs are otherwise clear. No
pneumothorax or pleural effusion. Cardiac size within normal limits.
Pulmonary vascularity is normal. No acute bone abnormality.
IMPRESSION: Retrocardiac infiltrate, possibly infectious in the appropriate
clinical setting.

## 2020-06-28 MED ORDER — AMOXICILLIN-POT CLAVULANATE 875-125 MG PO TABS: 1.0000 | ORAL_TABLET | Freq: Two times a day (BID) | ORAL | 0 refills | Status: AC

## 2020-06-28 MED ORDER — PREDNISONE 20 MG PO TABS: 60.0000 mg | ORAL_TABLET | Freq: Every day | ORAL | 0 refills | Status: AC

## 2020-06-28 MED ORDER — IPRATROPIUM-ALBUTEROL 0.5-2.5 (3) MG/3ML IN SOLN
3.0000 mL | Freq: Once | RESPIRATORY_TRACT | Status: AC
  Administered 2020-06-28: 07:00:00 3 mL via RESPIRATORY_TRACT
  Filled 2020-06-28: qty 3

## 2020-06-28 MED ORDER — METHYLPREDNISOLONE SODIUM SUCC 125 MG IJ SOLR
125.0000 mg | Freq: Once | INTRAMUSCULAR | Status: AC
  Administered 2020-06-28: 07:00:00 125 mg via INTRAVENOUS
  Filled 2020-06-28: qty 2

## 2020-06-28 MED ORDER — AZITHROMYCIN 250 MG PO TABS: ORAL_TABLET | ORAL | 0 refills | Status: DC

## 2020-06-28 MED ORDER — AZITHROMYCIN 500 MG PO TABS
500.0000 mg | ORAL_TABLET | Freq: Once | ORAL | Status: AC
  Administered 2020-06-28: 07:00:00 500 mg via ORAL
  Filled 2020-06-28: qty 1

## 2020-06-28 MED ORDER — AMOXICILLIN-POT CLAVULANATE 875-125 MG PO TABS
1.0000 | ORAL_TABLET | Freq: Once | ORAL | Status: AC
  Administered 2020-06-28: 07:00:00 1 via ORAL
  Filled 2020-06-28: qty 1

## 2020-06-28 MED ORDER — ACETAMINOPHEN 500 MG PO TABS
1000.0000 mg | ORAL_TABLET | Freq: Once | ORAL | Status: AC
  Administered 2020-06-28: 07:00:00 1000 mg via ORAL
  Filled 2020-06-28: qty 2

## 2020-06-28 NOTE — ED Notes (Signed)
Date and time results received: 06/28/20 8:49 AM  (use smartphrase ".now" to insert current time)  Test: Covid Critical Value: +  Name of Provider Notified: Kinner  Orders Received? Or Actions Taken?: Critical Results Acknowledged

## 2020-06-28 NOTE — ED Notes (Signed)
This RN to bedside, introduced self to patient, pt ambulated in room by EDP, pt sats 91-92% while ambulating. Repeat trop and covid swab obtained by this RN. Pt tolerated well. Pt provided with water and [place] sandwich tray by this RN with EDP permission. Pt denies further needs. Pt currently sitting up in bed eating at this time.

## 2020-06-28 NOTE — ED Notes (Signed)
Pt visualized in NAD. Pt requesting to know how much longer until test results come back. This RN apologized for and explained delay. Pt states understanding at this time.

## 2020-06-28 NOTE — ED Notes (Signed)
NAD noted at time of D/C. Pt denies questions or concerns. Pt ambulatory to the lobby at this time.  

## 2020-06-28 NOTE — ED Triage Notes (Signed)
Pt to ED reporting cough, SOB, chest pain and right sided back pain for the past couple days. Pt stating "it feels like I have bronchitis." Cough is productive with clear sputum. No fevers.

## 2020-06-28 NOTE — ED Provider Notes (Signed)
[facility] Emergency Department Provider Note  ____________________________________________  Time seen: Approximately 6:42 AM  I have reviewed the triage vital signs and the nursing notes.   HISTORY  Chief Complaint Dizziness and Shortness of Breath   HPI Brian Porter is a 54 y.o. male with a history of CAD, CHF (EF 30-35% Echo 05/2019), hypertension, sleep apnea, smoking who presents for evaluation of shortness of breath.  Patient reports progressively worsening cough productive of clear sputum over the last 2 to 3 weeks.  Yesterday morning patient reports that he started feeling very sick like he was coming down with the flu.  Is complaining of diffuse body aches, progressively worsening shortness of breath, chills, and lightheadedness.  He is complaining of a dull ache right upper back and right-sided chest pain which is mild to moderate in intensity and mostly felt when he is coughing.  No personal or family history of PE or DVT, no recent travel immobilization, no leg pain or swelling, no hemoptysis or exogenous hormones.  Does not take any blood thinners.  He has not been vaccinated against Covid.  Denies any known exposures.  No abdominal pain, no vomiting or diarrhea.  Past Medical History:  Diagnosis Date  . CAD (coronary artery disease)   . CHF (congestive heart failure) (HCC)   . GERD (gastroesophageal reflux disease)   . Hypertension   . MI, old   . Sleep apnea     Patient Active Problem List   Diagnosis Date Noted  . CAP (community acquired pneumonia) 07/24/2018  . Bilateral recurrent inguinal hernia without obstruction or gangrene   . Acute on chronic systolic heart failure (HCC) 08/27/2016  . HTN (hypertension) 11/02/2015  . Tobacco abuse 11/02/2015  . Hand joint pain 11/02/2015  . CHF (congestive heart failure) (HCC) 10/15/2015    Past Surgical History:  Procedure Laterality Date  . CARDIAC CATHETERIZATION Right 10/16/2015    Procedure: Left Heart Cath and Coronary Angiography;  Surgeon: Laurier NancyShaukat A Khan, MD;  Location: [facility] INVASIVE CV LAB;  Service: Cardiovascular;  Laterality: Right;  . CORONARY ANGIOPLASTY WITH STENT PLACEMENT  approx 2 years ago  . HERNIA REPAIR Left 11/29/2016   [facility]  . PARTIAL NEPHRECTOMY Left   . SPLENECTOMY      Prior to Admission medications   Medication Sig Start Date End Date Taking? Authorizing Provider  amoxicillin-clavulanate (AUGMENTIN) 875-125 MG tablet Take 1 tablet by mouth 2 (two) times daily for 7 days. 06/28/20 07/05/20 Yes Teondra Newburg, [place], MD  azithromycin [facility](ZITHROMAX) 250 MG tablet Take 1 a day for 4 days 06/28/20  Yes Don PerkingVeronese, [place], MD  predniSONE (DELTASONE) 20 MG tablet Take 3 tablets (60 mg total) by mouth daily for 4 days. 06/28/20 07/02/20 Yes Velton Roselle, [place], MD  aspirin 81 MG EC tablet Take 1 tablet (81 mg total) by mouth daily. 08/29/16   Houston SirenSainani, Vivek J, MD  benzonatate (TESSALON) 200 MG capsule Take 1 capsule (200 mg total) by mouth 3 (three) times daily as needed for cough. 02/23/20   Tommie Samsook, Jayce G, DO  furosemide (LASIX) 40 MG tablet Take 1 tablet (40 mg total) by mouth daily. 03/16/20   Delma FreezeHackney, Tina A, FNP  losartan (COZAAR) 50 MG tablet Take 1 tablet (50 mg total) by mouth daily. 10/08/19 03/22/20  Delma FreezeHackney, Tina A, FNP  metoprolol succinate (TOPROL-XL) 50 MG 24 hr tablet Take 1 tablet (50 mg total) by mouth daily. Take with or immediately following a meal. 09/02/19 03/22/20  Delma FreezeHackney, Tina A, FNP  potassium chloride (KLOR-CON) 10 MEQ tablet Take 1 tablet (10 mEq total) by mouth daily. Patient taking differently: Take 10 mEq by mouth daily. Patient reports he is taking half a pill (5mg ) daily 04/23/19   04/25/19, FNP    Allergies Delma Freeze [sacubitril-valsartan]  Family History  Problem Relation Age of Onset  . Heart failure Mother   . Hypertension Mother   . Other Mother        covid pneumonia  . Heart attack Father 22  . Diabetes Father   . Diabetes  Mellitus II Brother     Social History Social History   Tobacco Use  . Smoking status: Current Every Day Smoker    Packs/day: 1.00    Types: Cigarettes  . Smokeless tobacco: Never Used  Vaping Use  . Vaping Use: Never used  Substance Use Topics  . Alcohol use: No    Alcohol/week: 0.0 standard drinks  . Drug use: Not Currently    Comment: percocet    Review of Systems  Constitutional: Negative for fever. + body aches, dizziness Eyes: Negative for visual changes. ENT: Negative for sore throat. Neck: No neck pain  Cardiovascular: + chest pain. Respiratory: + shortness of breath, cough Gastrointestinal: Negative for abdominal pain, vomiting or diarrhea. Genitourinary: Negative for dysuria. Musculoskeletal: + R upper back pain. Skin: Negative for rash. Neurological: Negative for headaches, weakness or numbness. Psych: No SI or HI  ____________________________________________   PHYSICAL EXAM:  VITAL SIGNS: ED Triage Vitals [06/28/20 0146]  Enc Vitals Group     BP 121/69     Pulse Rate 74     Resp 16     Temp 98.3 F (36.8 C)     Temp Source Oral     SpO2 96 %     Weight 220 lb (99.8 kg)     Height 5\' 10"  (1.778 m)     Head Circumference      Peak Flow      Pain Score 5     Pain Loc      Pain Edu?      Excl. in GC?     Constitutional: Alert and oriented. Well appearing and in no apparent distress. HEENT:      Head: Normocephalic and atraumatic.         Eyes: Conjunctivae are normal. Sclera is non-icteric.       Mouth/Throat: Mucous membranes are moist.       Neck: Supple with no signs of meningismus. Cardiovascular: Regular rate and rhythm. No murmurs, gallops, or rubs. 2+ symmetrical distal pulses are present in all extremities.  Respiratory: Normal respiratory effort. Faint wheezes bilaterally Gastrointestinal: Soft, non tender, and non distended. Musculoskeletal:  No edema, cyanosis, or erythema of extremities. Neurologic: Normal speech and language.  Face is symmetric. Moving all extremities. No gross focal neurologic deficits are appreciated. Skin: Skin is warm, dry and intact. No rash noted. Psychiatric: Mood and affect are normal. Speech and behavior are normal.  ____________________________________________   LABS (all labs ordered are listed, but only abnormal results are displayed)  Labs Reviewed  BASIC METABOLIC PANEL - Abnormal; Notable for the following components:      Result Value   BUN 24 (*)    All other components within normal limits  RESP PANEL BY RT-PCR (FLU A&B, COVID) ARPGX2  CBC  PROCALCITONIN  TROPONIN I (HIGH SENSITIVITY)  TROPONIN I (HIGH SENSITIVITY)   ____________________________________________  EKG  ED ECG REPORT I, 06/30/20, the attending physician,  personally viewed and interpreted this ECG.  NSR, rate 70, left axis deviation, normal QTC, septal Q waves, no ST elevation. Unchanged from prior ____________________________________________  RADIOLOGY  I have personally reviewed the images performed during this visit and I agree with the Radiologist's read.   Interpretation by Radiologist:  DG Chest 2 View  Result Date: 06/28/2020 CLINICAL DATA:  Dyspnea EXAM: CHEST - 2 VIEW COMPARISON:  07/24/2018 FINDINGS: A a focal retrocardiac infiltrate is present, new from prior examination and suspicious for changes of acute lobar pneumonia in the appropriate clinical setting. The lungs are otherwise clear. No pneumothorax or pleural effusion. Cardiac size within normal limits. Pulmonary vascularity is normal. No acute bone abnormality. IMPRESSION: Retrocardiac infiltrate, possibly infectious in the appropriate clinical setting. Electronically Signed   By: Helyn Numbers MD   On: 06/28/2020 02:22     ____________________________________________   PROCEDURES  Procedure(s) performed:yes .1-3 Lead EKG Interpretation Performed by: Nita Sickle, MD Authorized by: Nita Sickle, MD      Interpretation: non-specific     ECG rate assessment: normal     Rhythm: sinus rhythm     Ectopy: none     Critical Care performed:  None ____________________________________________   INITIAL IMPRESSION / ASSESSMENT AND PLAN / ED COURSE   54 y.o. male with a history of CAD, CHF (EF 30-35% Echo 05/2019), hypertension, sleep apnea, smoking who presents for evaluation of shortness of breath, cough, dizziness and body aches.  Patient with normal work of breathing and normal sats both at rest and with ambulation.  Decreased air movement bilaterally with wheezing.  Patient looks euvolemic.  Chest x-ray visualized by me concerning for possible retrocardiac infiltrate.  Covid and flu are pending.  Patient was started on Augmentin and azithromycin for community-acquired pneumonia.  Patient given 3 duo nebs and steroids with significant improvement of his respiratory status.  Patient feels markedly improved.  Labs with no leukocytosis, no anemia, no electrolyte derangements.  First troponin is negative.  Second troponin is pending.  Procalcitonin is negative.  Plan to discharge home with referral to Mab infusion if Covid is positive.  Prescriptions for azithromycin, Augmentin and prednisone have been sent to his pharmacy.  Care transferred to Dr. Cyril Loosen      _____________________________________________ Please note:  Patient was evaluated in Emergency Department today for the symptoms described in the history of present illness. Patient was evaluated in the context of the global COVID-19 pandemic, which necessitated consideration that the patient might be at risk for infection with the SARS-CoV-2 virus that causes COVID-19. Institutional protocols and algorithms that pertain to the evaluation of patients at risk for COVID-19 are in a state of rapid change based on information released by regulatory bodies including the CDC and federal and state organizations. These policies and algorithms were followed  during the patient's care in the ED.  Some ED evaluations and interventions may be delayed as a result of limited staffing during the pandemic.   [place] Controlled Substance Database was reviewed by me. ____________________________________________   FINAL CLINICAL IMPRESSION(S) / ED DIAGNOSES   Final diagnoses:  Community acquired pneumonia of right lung, unspecified part of lung      NEW MEDICATIONS STARTED DURING THIS VISIT:  ED Discharge Orders         Ordered    predniSONE (DELTASONE) 20 MG tablet  Daily        06/28/20 0740    amoxicillin-clavulanate (AUGMENTIN) 875-125 MG tablet  2 times daily  06/28/20 0740    azithromycin (ZITHROMAX) 250 MG tablet        06/28/20 0740           Note:  This document was prepared using Dragon voice recognition software and may include unintentional dictation errors.    Don Perking, [place], MD 06/28/20 [phone]

## 2020-06-29 ENCOUNTER — Telehealth: Payer: Self-pay | Admitting: Infectious Diseases

## 2020-06-29 ENCOUNTER — Ambulatory Visit: Payer: Self-pay | Admitting: Family

## 2020-06-29 NOTE — Telephone Encounter (Signed)
Called to Discuss with patient about Covid symptoms and the use of the monoclonal antibody infusion for those with mild to moderate Covid symptoms and at a high risk of hospitalization.     Pt appears to qualify for this infusion due to co-morbid conditions and/or a member of an at-risk group in accordance with the FDA Emergency Use Authorization.   Symptom onset: Started getting lower back pain that progressed to more of a cough 2-3 weeks ago. Had abrupt onset chest pain and fever/fatigue that prompted him to seek care in the ER last night.  Vaccinated: no Qualified for Infusion: out of window    I think most likely his syndrome reflects a secondary bacterial pneumonia not acute COVID. CXR reviewed and more lobar infiltrate c/w bacterial cause. I did not offer him MAB therapy and he is having significant improvements with oral antibiotics presently.    Rexene Alberts, MSN, NP-C [facility] for Infectious Disease [facility]  [email or web address] Pager: [phone] Office: [phone] [facility] Main Line: [phone]

## 2020-07-24 ENCOUNTER — Ambulatory Visit: Payer: Self-pay | Admitting: Family

## 2020-08-02 ENCOUNTER — Ambulatory Visit: Payer: Self-pay | Admitting: Family

## 2020-08-11 ENCOUNTER — Other Ambulatory Visit: Payer: Self-pay

## 2020-08-11 ENCOUNTER — Encounter: Payer: Self-pay | Admitting: Family

## 2020-08-11 ENCOUNTER — Ambulatory Visit: Payer: Self-pay | Attending: Family | Admitting: Family

## 2020-08-11 VITALS — BP 141/88 | HR 68 | Resp 18 | Ht 69.0 in | Wt 220.2 lb

## 2020-08-11 DIAGNOSIS — I10 Essential (primary) hypertension: Secondary | ICD-10-CM

## 2020-08-11 DIAGNOSIS — Z79899 Other long term (current) drug therapy: Secondary | ICD-10-CM | POA: Insufficient documentation

## 2020-08-11 DIAGNOSIS — I5022 Chronic systolic (congestive) heart failure: Secondary | ICD-10-CM | POA: Insufficient documentation

## 2020-08-11 DIAGNOSIS — F17210 Nicotine dependence, cigarettes, uncomplicated: Secondary | ICD-10-CM | POA: Insufficient documentation

## 2020-08-11 DIAGNOSIS — I110 Hypertensive heart disease with heart failure: Secondary | ICD-10-CM | POA: Insufficient documentation

## 2020-08-11 DIAGNOSIS — Z8616 Personal history of COVID-19: Secondary | ICD-10-CM | POA: Insufficient documentation

## 2020-08-11 DIAGNOSIS — Z7982 Long term (current) use of aspirin: Secondary | ICD-10-CM | POA: Insufficient documentation

## 2020-08-11 DIAGNOSIS — Z720 Tobacco use: Secondary | ICD-10-CM

## 2020-08-11 DIAGNOSIS — Z8249 Family history of ischemic heart disease and other diseases of the circulatory system: Secondary | ICD-10-CM | POA: Insufficient documentation

## 2020-08-11 DIAGNOSIS — Z955 Presence of coronary angioplasty implant and graft: Secondary | ICD-10-CM | POA: Insufficient documentation

## 2020-08-11 DIAGNOSIS — R059 Cough, unspecified: Secondary | ICD-10-CM | POA: Insufficient documentation

## 2020-08-11 MED ORDER — SPIRONOLACTONE 25 MG PO TABS: 25.0000 mg | ORAL_TABLET | Freq: Every day | ORAL | 3 refills | Status: DC

## 2020-08-11 NOTE — Progress Notes (Signed)
Patient ID: Brian Porter, male    DOB: [birth year], 55 y.o.   MRN: [id number]   Mr Slates is a 55 y/o male with a history of obstructive sleep apnea (without CPAP), CAD, HTN, GERD, MI, current tobacco use and chronic heart failure.   Echo report from 05/24/2019 reviewed and showed an EF of 30-35% along with trivial TR/PR. Echo done 10/15/15 and showed an EF of 35% along with moderate MR/TR and an elevated PA pressure of 48 mm Hg.   Had a cardiac catheterization done 10/16/15 and showed an EF of 15% along with multi-vessel disease. Recommended treatment for cardiomyopathy.   Was in the ED 06/28/20 due to dizziness and shortness of breath. Antibiotics started for possible pneumonia. Tested covid + and he was released.   He presents today for a follow-up visit with a chief complaint of continued cough. Reports this cough as having been present for several months. Tested positive for covid Dec 2021 and says that his cough has been improving. He has no other symptoms and specifically denies any difficulty sleeping, abdominal distention, palpitations, pedal edema, chest pain, shortness of breath, dizziness, fatigue or weight gain.   He requests that I message [facility] to inquire about when he can be seen. He says that he's left messages.   Past Medical History:  Diagnosis Date  . CAD (coronary artery disease)   . CHF (congestive heart failure) (HCC)   . GERD (gastroesophageal reflux disease)   . Hypertension   . MI, old   . Sleep apnea    Past Surgical History:  Procedure Laterality Date  . CARDIAC CATHETERIZATION Right 10/16/2015   Procedure: Left Heart Cath and Coronary Angiography;  Surgeon: Laurier Nancy, MD;  Location: [facility];  Service: Cardiovascular;  Laterality: Right;  . CORONARY ANGIOPLASTY WITH STENT PLACEMENT  approx 2 years ago  . HERNIA REPAIR Left 11/29/2016   [facility]  . PARTIAL NEPHRECTOMY Left   . SPLENECTOMY     Family History  Problem Relation Age of  Onset  . Heart failure Mother   . Hypertension Mother   . Other Mother        covid pneumonia  . Heart attack Father 21  . Diabetes Father   . Diabetes Mellitus II Brother    Social History   Tobacco Use  . Smoking status: Current Every Day Smoker    Packs/day: 1.00    Types: Cigarettes  . Smokeless tobacco: Never Used  Substance Use Topics  . Alcohol use: No    Alcohol/week: 0.0 standard drinks   Allergies  Allergen Reactions  . Entresto [Sacubitril-Valsartan] Cough   Prior to Admission medications   Medication Sig Start Date End Date Taking? Authorizing Provider  aspirin 81 MG EC tablet Take 1 tablet (81 mg total) by mouth daily. 08/29/16  Yes Houston Siren, MD  furosemide (LASIX) 40 MG tablet Take 1 tablet (40 mg total) by mouth daily. 03/16/20  Yes Decarla Siemen, Inetta Fermo A, FNP  potassium chloride (KLOR-CON) 10 MEQ tablet Take 1 tablet (10 mEq total) by mouth daily. Patient taking differently: Take 10 mEq by mouth daily. Patient reports he is taking half a pill (5mg ) daily 04/23/19  Yes 04/25/19 A, FNP  losartan (COZAAR) 50 MG tablet Take 1 tablet (50 mg total) by mouth daily. 10/08/19 03/22/20  03/24/20, FNP  metoprolol succinate (TOPROL-XL) 50 MG 24 hr tablet Take 1 tablet (50 mg total) by mouth daily. Take with or  immediately following a meal. 09/02/19 03/22/20  Delma Freeze, FNP    Review of Systems  Constitutional: Negative for appetite change and fatigue.  HENT: Negative for congestion, postnasal drip and sore throat.   Eyes: Negative.   Respiratory: Positive for cough (improving). Negative for chest tightness and shortness of breath.   Cardiovascular: Negative for chest pain, palpitations and leg swelling.  Gastrointestinal: Negative for abdominal distention.  Endocrine: Negative.   Genitourinary: Negative.   Musculoskeletal: Positive for myalgias (right leg). Negative for back pain.  Skin: Negative.   Allergic/Immunologic: Negative.   Neurological: Negative  for dizziness and light-headedness.  Hematological: Negative for adenopathy. Does not bruise/bleed easily.  Psychiatric/Behavioral: Negative for dysphoric mood, sleep disturbance (sleeping on 2 pillows) and suicidal ideas. The patient is not nervous/anxious.    Vitals:   08/11/20 1211  BP: (!) 141/88  Pulse: 68  Resp: 18  SpO2: 99%  Weight: 220 lb 4 oz (99.9 kg)  Height: 5\' 9"  (1.753 m)   Wt Readings from Last 3 Encounters:  08/11/20 220 lb 4 oz (99.9 kg)  06/28/20 220 lb (99.8 kg)  05/27/20 220 lb (99.8 kg)   Lab Results  Component Value Date   CREATININE 0.97 06/28/2020   CREATININE 0.89 05/27/2020   CREATININE 0.81 09/02/2019   Physical Exam Vitals and nursing note reviewed.  Constitutional:      Appearance: He is well-developed.  HENT:     Head: Normocephalic and atraumatic.  Neck:     Vascular: No JVD.  Cardiovascular:     Rate and Rhythm: Normal rate and regular rhythm.  Pulmonary:     Effort: Pulmonary effort is normal.     Breath sounds: No wheezing or rales.  Abdominal:     General: There is no distension.     Palpations: Abdomen is soft.     Tenderness: There is no abdominal tenderness.  Musculoskeletal:        General: No tenderness.     Cervical back: Normal range of motion and neck supple.  Skin:    General: Skin is warm and dry.  Neurological:     Mental Status: He is alert and oriented to person, place, and time.  Psychiatric:        Behavior: Behavior normal.        Thought Content: Thought content normal.       Assessment & Plan:   1: Chronic heart failure with reduced ejection fraction- - NYHA class I - euvolemic in appearance  - weighing daily. Reminded to call for an overnight weight gain of >2 pounds or a weekly weight gain of >5 pounds.  - weight down 3 pounds since he was last here 3 months ago - add spironolactone 25mg  daily; will check labs at next visit and possibly stop potassium supplements.  - not adding salt to his food and  tries to follow a 2000mg  sodium diet - BNP 07/24/2018 was 207.0  2: HTN- - BP mildly elevated today; adding spironolactone per above - still hasn't gotten established with a PCP but says that he's contacted [facility] in the past; per his request, will message [facility] regarding possible appointment - reviewed BMP from 06/28/20 which showed sodium 137, potassium 4.1, creatinine 0.97 and GFR >60 - not interested in [facility]  3: Tobacco use- - smokes 1/2 ppd of cigarettes - complete cessation discussed for 3 minutes with him   Patient did not bring his medications nor a  list. Each medication was verbally reviewed with the patient and he was encouraged to bring the bottles to every visit to confirm accuracy of list.  Return in 3 weeks or sooner for any questions/problems before then.

## 2020-08-11 NOTE — Patient Instructions (Signed)
Continue weighing daily and call for an overnight weight gain of > 2 pounds or a weekly weight gain of >5 pounds. 

## 2020-08-12 ENCOUNTER — Encounter: Payer: Self-pay | Admitting: Family

## 2020-08-31 ENCOUNTER — Ambulatory Visit: Payer: Self-pay | Admitting: Family

## 2020-08-31 ENCOUNTER — Other Ambulatory Visit: Payer: Self-pay

## 2020-08-31 ENCOUNTER — Ambulatory Visit: Payer: Self-pay | Admitting: Family Medicine

## 2020-08-31 VITALS — BP 119/67 | HR 77 | Wt 220.7 lb

## 2020-08-31 DIAGNOSIS — I10 Essential (primary) hypertension: Secondary | ICD-10-CM

## 2020-08-31 DIAGNOSIS — I5022 Chronic systolic (congestive) heart failure: Secondary | ICD-10-CM

## 2020-08-31 DIAGNOSIS — J189 Pneumonia, unspecified organism: Secondary | ICD-10-CM

## 2020-08-31 DIAGNOSIS — I2510 Atherosclerotic heart disease of native coronary artery without angina pectoris: Secondary | ICD-10-CM

## 2020-08-31 DIAGNOSIS — M79604 Pain in right leg: Secondary | ICD-10-CM

## 2020-08-31 NOTE — Progress Notes (Signed)
Established Patient Office Visit  Subjective:  Patient ID: Brian Porter, male    DOB: 03-01-1966  Age: 55 y.o. MRN: [id number]  CC:  Chief Complaint  Patient presents with  . Shortness of Breath  . Dizziness    Spells of dizziness  . Leg Pain    Legs feel sore and ache often    HPI  Brian Porter presents for follow-up of chronic medical problems including recent history of pneumonia and heart failure.  EF 30 to 35%.  He was started on spironolactone and feels better since starting this. His only complaint is 1 of chronic leg pain in the morning which gets better when he gets up and moves around.  It is in the anterior thighs and down the anterior lower leg.  No claudication no sciatica by description and no pain with weightbearing.  It appears to be muscular but it gets better with exertion.  Is not on a statin  Past Medical History:  Diagnosis Date  . CAD (coronary artery disease)   . CHF (congestive heart failure) (HCC)   . GERD (gastroesophageal reflux disease)   . Hypertension   . MI, old   . Sleep apnea     Past Surgical History:  Procedure Laterality Date  . CARDIAC CATHETERIZATION Right 10/16/2015   Procedure: Left Heart Cath and Coronary Angiography;  Surgeon: Laurier Nancy, MD;  Location: [facility];  Service: Cardiovascular;  Laterality: Right;  . CORONARY ANGIOPLASTY WITH STENT PLACEMENT  approx 2 years ago  . HERNIA REPAIR Left 11/29/2016   [facility]  . PARTIAL NEPHRECTOMY Left   . SPLENECTOMY      Family History  Problem Relation Age of Onset  . Heart failure Mother   . Hypertension Mother   . Other Mother        covid pneumonia  . Heart attack Father 71  . Diabetes Father   . Diabetes Mellitus II Brother     Social History   Socioeconomic History  . Marital status: Single    Spouse name: Not on file  . Number of children: Not on file  . Years of education: Not on file  . Highest education level: Not on file  Occupational History  .  Occupation: unemployed  Tobacco Use  . Smoking status: Current Every Day Smoker    Packs/day: 0.50    Types: Cigarettes  . Smokeless tobacco: Never Used  . Tobacco comment: noted that he cut back from 1 pack a day to 1/2 a pack a day  Vaping Use  . Vaping Use: Never used  Substance and Sexual Activity  . Alcohol use: No    Alcohol/week: 0.0 standard drinks  . Drug use: Not Currently    Comment: percocet  . Sexual activity: Not Currently  Other Topics Concern  . Not on file  Social History Narrative  . Not on file   Social Determinants of Health   Financial Resource Strain: Not on file  Food Insecurity: Not on file  Transportation Needs: Not on file  Physical Activity: Not on file  Stress: Not on file  Social Connections: Not on file  Intimate Partner Violence: Not on file    Outpatient Medications Prior to Visit  Medication Sig Dispense Refill  . aspirin 81 MG EC tablet Take 1 tablet (81 mg total) by mouth daily. 30 tablet 2  . furosemide (LASIX) 40 MG tablet Take 1 tablet (40 mg total) by mouth daily. (Patient taking differently:  Take 60 mg by mouth daily.) 90 tablet 3  . losartan (COZAAR) 50 MG tablet Take 1 tablet (50 mg total) by mouth daily. 30 tablet 5  . metoprolol succinate (TOPROL-XL) 50 MG 24 hr tablet Take 1 tablet (50 mg total) by mouth daily. Take with or immediately following a meal. 90 tablet 3  . potassium chloride (KLOR-CON) 10 MEQ tablet Take 1 tablet (10 mEq total) by mouth daily. (Patient taking differently: Take 10 mEq by mouth daily. Patient reports he is taking half a pill (5mg ) daily) 90 tablet 3  . spironolactone (ALDACTONE) 25 MG tablet Take 1 tablet (25 mg total) by mouth daily. 90 tablet 3   No facility-administered medications prior to visit.    Allergies  Allergen Reactions  . Entresto [Sacubitril-Valsartan] Cough    ROS Review of Systems    Objective:    Physical Exam Vitals reviewed.  Constitutional:      Appearance: He is  well-developed.  HENT:     Head: Normocephalic and atraumatic.  Cardiovascular:     Rate and Rhythm: Normal rate and regular rhythm.     Pulses: Normal pulses.     Heart sounds: Normal heart sounds.  Pulmonary:     Effort: Pulmonary effort is normal.     Breath sounds: Normal breath sounds.  Abdominal:     Palpations: Abdomen is soft.  Musculoskeletal:     Right lower leg: No tenderness. No edema.     Left lower leg: No tenderness. No edema.  Skin:    General: Skin is warm and dry.  Neurological:     General: No focal deficit present.     Mental Status: He is alert and oriented to person, place, and time.  Psychiatric:        Mood and Affect: Mood normal.        Behavior: Behavior normal.     BP 119/67 (BP Location: Right Leg, Patient Position: Sitting, Cuff Size: Large)   Pulse 77   Wt 220 lb 11.2 oz (100.1 kg)   SpO2 97%   BMI 32.59 kg/m  Wt Readings from Last 3 Encounters:  08/31/20 220 lb 11.2 oz (100.1 kg)  08/11/20 220 lb 4 oz (99.9 kg)  06/28/20 220 lb (99.8 kg)     Health Maintenance Due  Topic Date Due  . Hepatitis C Screening  Never done  . COVID-19 Vaccine (1) Never done  . TETANUS/TDAP  Never done  . COLONOSCOPY (Pts 45-22yrs Insurance coverage will need to be confirmed)  Never done  . INFLUENZA VACCINE  Never done    There are no preventive care reminders to display for this patient.  Lab Results  Component Value Date   TSH 0.722 07/24/2018   Lab Results  Component Value Date   WBC 10.1 06/28/2020   HGB 16.5 06/28/2020   HCT 48.2 06/28/2020   MCV 93.6 06/28/2020   PLT 293 06/28/2020   Lab Results  Component Value Date   NA 137 06/28/2020   K 4.1 06/28/2020   CO2 26 06/28/2020   GLUCOSE 94 06/28/2020   BUN 24 (H) 06/28/2020   CREATININE 0.97 06/28/2020   BILITOT 0.9 07/24/2018   ALKPHOS 70 07/24/2018   AST 16 07/24/2018   ALT 19 07/24/2018   PROT 7.5 07/24/2018   ALBUMIN 4.1 07/24/2018   CALCIUM 8.9 06/28/2020   ANIONGAP 7  06/28/2020   No results found for: CHOL No results found for: HDL No results found for: LDLCALC No results  found for: TRIG No results found for: CHOLHDL No results found for: ERXV4M    Assessment & Plan:   Problem List Items Addressed This Visit   None   1. Chronic systolic congestive heart failure (HCC) Patient was seen at heart failure clinic and started on spironolactone and actually feels better.  Continue same.  Follow-up routine labs on this.  Follow-up about 2 months.  2. Primary hypertension Good control.  3. Community acquired pneumonia, unspecified laterality Follow-up chest x-ray for pneumonia from 2 months ago.  4. Leg pain, anterior, right This appears to be muscular.  Will obtain CK but no further work-up indicated at this time.  It improves with walking.  5. Coronary artery disease involving native coronary artery of native heart without angina pectoris Patient has not history of CAD and stent placement.  No history of lipids in the chart.  Will obtain nonfasting lipid panel.  Follow-up as appropriate. Also has a history of diabetes listed which he says he has not had.  We will check an A1c if this is normal will take diabetes off of his list.   No orders of the defined types were placed in this encounter.   Follow-up: No follow-ups on file.    Megan Mans, MD

## 2020-09-01 ENCOUNTER — Ambulatory Visit: Payer: Self-pay | Admitting: Family

## 2020-09-01 ENCOUNTER — Other Ambulatory Visit: Payer: Self-pay | Admitting: Family

## 2020-09-01 LAB — COMPREHENSIVE METABOLIC PANEL
ALT: 17 IU/L (ref 0–44)
AST: 15 IU/L (ref 0–40)
Albumin/Globulin Ratio: 1.4 (ref 1.2–2.2)
Albumin: 4 g/dL (ref 3.8–4.9)
Alkaline Phosphatase: 63 IU/L (ref 44–121)
BUN/Creatinine Ratio: 20 (ref 9–20)
BUN: 16 mg/dL (ref 6–24)
Bilirubin Total: 0.3 mg/dL (ref 0.0–1.2)
CO2: 23 mmol/L (ref 20–29)
Calcium: 8.9 mg/dL (ref 8.7–10.2)
Chloride: 103 mmol/L (ref 96–106)
Creatinine, Ser: 0.82 mg/dL (ref 0.76–1.27)
Globulin, Total: 2.9 g/dL (ref 1.5–4.5)
Glucose: 90 mg/dL (ref 65–99)
Potassium: 4.2 mmol/L (ref 3.5–5.2)
Sodium: 140 mmol/L (ref 134–144)
Total Protein: 6.9 g/dL (ref 6.0–8.5)
eGFR: 104 mL/min/{1.73_m2} (ref >59)

## 2020-09-01 LAB — SEDIMENTATION RATE: Sed Rate: 22 mm/hr (ref 0–30)

## 2020-09-01 LAB — LIPID PANEL
Chol/HDL Ratio: 5.5 ratio — ABNORMAL HIGH (ref 0.0–5.0)
Cholesterol, Total: 169 mg/dL (ref 100–199)
HDL: 31 mg/dL — ABNORMAL LOW (ref >39)
LDL Chol Calc (NIH): 114 mg/dL — ABNORMAL HIGH (ref 0–99)
Triglycerides: 134 mg/dL (ref 0–149)
VLDL Cholesterol Cal: 24 mg/dL (ref 5–40)

## 2020-09-01 LAB — CK: Total CK: 68 U/L (ref 41–331)

## 2020-09-11 ENCOUNTER — Ambulatory Visit: Payer: Self-pay | Admitting: Family

## 2020-09-12 ENCOUNTER — Ambulatory Visit: Payer: Self-pay | Attending: Family | Admitting: Family

## 2020-09-12 ENCOUNTER — Other Ambulatory Visit: Payer: Self-pay

## 2020-09-12 ENCOUNTER — Encounter: Payer: Self-pay | Admitting: Family

## 2020-09-12 VITALS — BP 137/80 | HR 81 | Resp 20 | Ht 70.0 in | Wt 219.0 lb

## 2020-09-12 DIAGNOSIS — I10 Essential (primary) hypertension: Secondary | ICD-10-CM

## 2020-09-12 DIAGNOSIS — Z7982 Long term (current) use of aspirin: Secondary | ICD-10-CM | POA: Insufficient documentation

## 2020-09-12 DIAGNOSIS — Z8249 Family history of ischemic heart disease and other diseases of the circulatory system: Secondary | ICD-10-CM | POA: Insufficient documentation

## 2020-09-12 DIAGNOSIS — Z955 Presence of coronary angioplasty implant and graft: Secondary | ICD-10-CM | POA: Insufficient documentation

## 2020-09-12 DIAGNOSIS — Z79899 Other long term (current) drug therapy: Secondary | ICD-10-CM | POA: Insufficient documentation

## 2020-09-12 DIAGNOSIS — Z905 Acquired absence of kidney: Secondary | ICD-10-CM | POA: Insufficient documentation

## 2020-09-12 DIAGNOSIS — I110 Hypertensive heart disease with heart failure: Secondary | ICD-10-CM | POA: Insufficient documentation

## 2020-09-12 DIAGNOSIS — F17210 Nicotine dependence, cigarettes, uncomplicated: Secondary | ICD-10-CM | POA: Insufficient documentation

## 2020-09-12 DIAGNOSIS — I252 Old myocardial infarction: Secondary | ICD-10-CM | POA: Insufficient documentation

## 2020-09-12 DIAGNOSIS — I2510 Atherosclerotic heart disease of native coronary artery without angina pectoris: Secondary | ICD-10-CM | POA: Insufficient documentation

## 2020-09-12 DIAGNOSIS — Z716 Tobacco abuse counseling: Secondary | ICD-10-CM | POA: Insufficient documentation

## 2020-09-12 DIAGNOSIS — Z720 Tobacco use: Secondary | ICD-10-CM

## 2020-09-12 DIAGNOSIS — I5022 Chronic systolic (congestive) heart failure: Secondary | ICD-10-CM | POA: Insufficient documentation

## 2020-09-12 DIAGNOSIS — K219 Gastro-esophageal reflux disease without esophagitis: Secondary | ICD-10-CM | POA: Insufficient documentation

## 2020-09-12 MED ORDER — FUROSEMIDE 40 MG PO TABS: 40.0000 mg | ORAL_TABLET | Freq: Every day | ORAL | 3 refills | Status: DC

## 2020-09-12 NOTE — Progress Notes (Signed)
Patient ID: Brian Porter, male    DOB: May 05, 1966, 55 y.o.   MRN: [id number]   Brian Porter is a 55 y/o male with a history of obstructive sleep apnea (without CPAP), CAD, HTN, GERD, MI, current tobacco use and chronic heart failure.   Echo report from 05/24/2019 reviewed and showed an EF of 30-35% along with trivial TR/PR. Echo done 10/15/15 and showed an EF of 35% along with moderate Brian/TR and an elevated PA pressure of 48 mm Hg.   Had a cardiac catheterization done 10/16/15 and showed an EF of 15% along with multi-vessel disease. Recommended treatment for cardiomyopathy.   Was in the ED 06/28/20 due to dizziness and shortness of breath. Antibiotics started for possible pneumonia. Tested covid + and he was released.   He presents today for a follow-up visit with a chief complaint of intermittent cough. He describes this as chronic in nature having been present for several years. He has associated leg pain along with this. He denies any difficulty sleeping, abdominal distention, palpitations, pedal edema, chest pain, dizziness, shortness of breath, fatigue or weight gain.   Says that he's been seen at [facility] since he was last here and was pleased that he was able to finally get an appointment there.   Past Medical History:  Diagnosis Date  . CAD (coronary artery disease)   . CHF (congestive heart failure) (HCC)   . GERD (gastroesophageal reflux disease)   . Hypertension   . MI, old   . Sleep apnea    Past Surgical History:  Procedure Laterality Date  . CARDIAC CATHETERIZATION Right 10/16/2015   Procedure: Left Heart Cath and Coronary Angiography;  Surgeon: Laurier Nancy, MD;  Location: [facility];  Service: Cardiovascular;  Laterality: Right;  . CORONARY ANGIOPLASTY WITH STENT PLACEMENT  approx 2 years ago  . HERNIA REPAIR Left 11/29/2016   [facility]  . PARTIAL NEPHRECTOMY Left   . SPLENECTOMY     Family History  Problem Relation Age of Onset  . Heart failure Mother    . Hypertension Mother   . Other Mother        covid pneumonia  . Heart attack Father 91  . Diabetes Father   . Diabetes Mellitus II Brother    Social History   Tobacco Use  . Smoking status: Current Every Day Smoker    Packs/day: 0.50    Types: Cigarettes  . Smokeless tobacco: Never Used  . Tobacco comment: noted that he cut back from 1 pack a day to 1/2 a pack a day  Substance Use Topics  . Alcohol use: No    Alcohol/week: 0.0 standard drinks   Allergies  Allergen Reactions  . Entresto [Sacubitril-Valsartan] Cough   Prior to Admission medications   Medication Sig Start Date End Date Taking? Authorizing Provider  aspirin 81 MG EC tablet Take 1 tablet (81 mg total) by mouth daily. 08/29/16  Yes Houston Siren, MD  furosemide (LASIX) 40 MG tablet Take 1 tablet (40 mg total) by mouth daily. 03/16/20  Yes Mira Balon, Inetta Fermo A, FNP  potassium chloride (KLOR-CON) 10 MEQ tablet Take 1 tablet (10 mEq total) by mouth daily. Patient taking differently: Take 10 mEq by mouth daily. Patient reports he is taking half a pill (5mg ) daily 04/23/19  Yes 04/25/19 A, FNP  losartan (COZAAR) 50 MG tablet Take 1 tablet (50 mg total) by mouth daily. 10/08/19 03/22/20  03/24/20, FNP  metoprolol succinate (TOPROL-XL) 50  MG 24 hr tablet Take 1 tablet (50 mg total) by mouth daily. Take with or immediately following a meal. 09/02/19 03/22/20  Delma Freeze, FNP    Review of Systems  Constitutional: Negative for appetite change and fatigue.  HENT: Negative for congestion, postnasal drip and sore throat.   Eyes: Negative.   Respiratory: Positive for cough (intermittent). Negative for chest tightness and shortness of breath.   Cardiovascular: Negative for chest pain, palpitations and leg swelling.  Gastrointestinal: Negative for abdominal distention.  Endocrine: Negative.   Genitourinary: Negative.   Musculoskeletal: Positive for myalgias (right leg). Negative for back pain.  Skin: Negative.    Allergic/Immunologic: Negative.   Neurological: Negative for dizziness and light-headedness.  Hematological: Negative for adenopathy. Does not bruise/bleed easily.  Psychiatric/Behavioral: Negative for dysphoric mood, sleep disturbance (sleeping on 2 pillows) and suicidal ideas. The patient is not nervous/anxious.    Vitals:   09/12/20 1346  BP: 137/80  Pulse: 81  Resp: 20  SpO2: 98%  Weight: 219 lb (99.3 kg)  Height: 5\' 10"  (1.778 m)   Wt Readings from Last 3 Encounters:  09/12/20 219 lb (99.3 kg)  08/31/20 220 lb 11.2 oz (100.1 kg)  08/11/20 220 lb 4 oz (99.9 kg)   Lab Results  Component Value Date   CREATININE 0.82 08/31/2020   CREATININE 0.97 06/28/2020   CREATININE 0.89 05/27/2020   Physical Exam Vitals and nursing note reviewed.  Constitutional:      Appearance: He is well-developed.  HENT:     Head: Normocephalic and atraumatic.  Neck:     Vascular: No JVD.  Cardiovascular:     Rate and Rhythm: Normal rate and regular rhythm.  Pulmonary:     Effort: Pulmonary effort is normal.     Breath sounds: No wheezing or rales.  Abdominal:     General: There is no distension.     Palpations: Abdomen is soft.     Tenderness: There is no abdominal tenderness.  Musculoskeletal:        General: No tenderness.     Cervical back: Normal range of motion and neck supple.  Skin:    General: Skin is warm and dry.  Neurological:     Mental Status: He is alert and oriented to person, place, and time.  Psychiatric:        Behavior: Behavior normal.        Thought Content: Thought content normal.       Assessment & Plan:   1: Chronic heart failure with reduced ejection fraction- - NYHA class I - euvolemic in appearance  - weighing daily. Reminded to call for an overnight weight gain of >2 pounds or a weekly weight gain of >5 pounds.  - weight unchanged since he was last here 1 month ago - started on spironolactone last visit and says that he overall feels better  -  discussed the following GDMT if able: titrate losartan / metoprolol and adding SGLT2 - not adding salt to his food and tries to follow a 2000mg  sodium diet - BNP 07/24/2018 was 207.0  2: HTN- - BP looks good today; was much lower at PCP appointment last week - saw provider at [facility] 08/31/20 - reviewed BMP from 08/31/20 which showed sodium 140, potassium 4.2, creatinine 0.82 and GFR 104 - consider repeating labs in a month and possibly stopping potassium supplement  3: Tobacco use- - smokes 1/2 ppd of cigarettes - complete cessation discussed for 3 minutes with him  Patient did not bring his medications nor a list. Each medication was verbally reviewed with the patient and he was encouraged to bring the bottles to every visit to confirm accuracy of list.  Return in 1 month or sooner for any questions/problems before then.

## 2020-09-12 NOTE — Patient Instructions (Signed)
Continue weighing daily and call for an overnight weight gain of > 2 pounds or a weekly weight gain of >5 pounds. 

## 2020-09-27 ENCOUNTER — Ambulatory Visit: Payer: Self-pay | Admitting: Adult Health

## 2020-09-27 ENCOUNTER — Other Ambulatory Visit: Payer: Self-pay

## 2020-09-27 VITALS — BP 134/83 | HR 85 | Temp 97.3°F | Resp 16 | Ht 69.0 in | Wt 219.7 lb

## 2020-09-27 DIAGNOSIS — I2510 Atherosclerotic heart disease of native coronary artery without angina pectoris: Secondary | ICD-10-CM

## 2020-09-27 DIAGNOSIS — R7303 Prediabetes: Secondary | ICD-10-CM

## 2020-09-27 DIAGNOSIS — Z720 Tobacco use: Secondary | ICD-10-CM

## 2020-09-27 DIAGNOSIS — J069 Acute upper respiratory infection, unspecified: Secondary | ICD-10-CM

## 2020-09-27 MED ORDER — ATORVASTATIN CALCIUM 20 MG PO TABS: 20.0000 mg | ORAL_TABLET | Freq: Every day | ORAL | 2 refills | Status: DC

## 2020-09-27 MED ORDER — PREDNISONE 20 MG PO TABS: 40.0000 mg | ORAL_TABLET | Freq: Every day | ORAL | 0 refills | Status: DC

## 2020-09-27 NOTE — Progress Notes (Signed)
Patient: Brian Porter [facility] Male    DOB: 1965-09-26   55 y.o.   MRN: [id number] Visit Date: 09/27/2020  Today's Provider: Shawn Route, NP   No chief complaint on file.  Subjective:    HPI Patient presents with cough, nasal drainage and congestion x 3 days. Took mucinex without any improvement. Denies fever, shortness of breath and denies increased need for rescue inhaler. Cough is productive of clear sputum. He continues to smoke and does not want to quit. He has a h/o COPD and CHF. Denies increased lower extremity edema. Reports taking medications as prescribed.   Allergies  Allergen Reactions   Entresto [Sacubitril-Valsartan] Cough   Previous Medications   ASPIRIN 81 MG EC TABLET    Take 1 tablet (81 mg total) by mouth daily.   FUROSEMIDE (LASIX) 40 MG TABLET    Take 1 tablet (40 mg total) by mouth daily.   LOSARTAN (COZAAR) 50 MG TABLET    Take 1 tablet (50 mg total) by mouth daily.   METOPROLOL SUCCINATE (TOPROL-XL) 50 MG 24 HR TABLET    Take 1 tablet (50 mg total) by mouth daily. Take with or immediately following a meal.   POTASSIUM CHLORIDE (KLOR-CON) 10 MEQ TABLET    Take 1 tablet (10 mEq total) by mouth daily.   SPIRONOLACTONE (ALDACTONE) 25 MG TABLET    Take 1 tablet (25 mg total) by mouth daily.    Review of Systems  Constitutional:  Negative for activity change, chills and fever.  HENT:  Positive for congestion and rhinorrhea.   Respiratory:  Positive for cough. Negative for shortness of breath and wheezing.   Gastrointestinal: Negative.   Musculoskeletal: Negative.   Skin: Negative.    Social History   Tobacco Use   Smoking status: Current Every Day Smoker    Packs/day: 0.50    Types: Cigarettes   Smokeless tobacco: Never Used   Tobacco comment: noted that he cut back from 1 pack a day to 1/2 a pack a day  Substance Use Topics   Alcohol use: No    Alcohol/week: 0.0 standard drinks   Objective:   BP 134/83   Pulse 85   Temp (!) 97.3 F (36.3 C)    Resp 16   Ht 5\' 9"  (1.753 m)   Wt 219 lb 11.2 oz (99.7 kg)   SpO2 97%   BMI 32.44 kg/m   Physical Exam Constitutional:      Appearance: Normal appearance.  HENT:     Nose: Congestion and rhinorrhea present.     Mouth/Throat:     Mouth: Mucous membranes are moist.  Eyes:     Extraocular Movements: Extraocular movements intact.     Conjunctiva/sclera: Conjunctivae normal.     Pupils: Pupils are equal, round, and reactive to light.  Cardiovascular:     Rate and Rhythm: Normal rate and regular rhythm.     Pulses: Normal pulses.     Heart sounds: Normal heart sounds.  Pulmonary:     Effort: Pulmonary effort is normal.     Breath sounds: Normal breath sounds.  Abdominal:     General: Abdomen is flat. Bowel sounds are normal.     Palpations: Abdomen is soft.  Musculoskeletal:     Cervical back: Neck supple.  Neurological:     Mental Status: He is alert.        Assessment & Plan:     1. Prediabetes Not checking his blood glucose routinely. Will obtain HgA1C today - HgB  A1c -Started on statin for prediabetes associated hyperlipidemia  2. Tobacco abuse Smoking cessation advise given and patient advised to decrease number of cigarettes per day  3. Upper respiratory tract infection, unspecified type Will treat with prednisone 40mg  daily x 5 days. Patient advised to call back if sputum becomes green or yellow.    , NP   [facility] [place]

## 2020-09-28 LAB — HEMOGLOBIN A1C
Est. average glucose Bld gHb Est-mCnc: 126 mg/dL
Hgb A1c MFr Bld: 6 % — ABNORMAL HIGH (ref 4.8–5.6)

## 2020-10-05 ENCOUNTER — Ambulatory Visit: Payer: Self-pay | Admitting: Gerontology

## 2020-10-05 ENCOUNTER — Other Ambulatory Visit: Payer: Self-pay

## 2020-10-05 ENCOUNTER — Encounter: Payer: Self-pay | Admitting: Gerontology

## 2020-10-05 ENCOUNTER — Ambulatory Visit: Payer: Self-pay

## 2020-10-05 VITALS — BP 115/76 | HR 72 | Ht 68.0 in | Wt 223.6 lb

## 2020-10-05 DIAGNOSIS — M79604 Pain in right leg: Secondary | ICD-10-CM

## 2020-10-05 DIAGNOSIS — R7303 Prediabetes: Secondary | ICD-10-CM | POA: Insufficient documentation

## 2020-10-05 DIAGNOSIS — Z720 Tobacco use: Secondary | ICD-10-CM

## 2020-10-05 DIAGNOSIS — M79606 Pain in leg, unspecified: Secondary | ICD-10-CM | POA: Insufficient documentation

## 2020-10-05 DIAGNOSIS — M79605 Pain in left leg: Secondary | ICD-10-CM

## 2020-10-05 DIAGNOSIS — E785 Hyperlipidemia, unspecified: Secondary | ICD-10-CM

## 2020-10-05 MED ORDER — LIDOCAINE 4 % EX PTCH: 1.0000 | MEDICATED_PATCH | Freq: Two times a day (BID) | CUTANEOUS | 0 refills | Status: DC

## 2020-10-05 NOTE — Progress Notes (Signed)
Established Patient Office Visit  Subjective:  Patient ID: Brian Porter, male    DOB: 1965-10-31  Age: 55 y.o. MRN: [id number]  CC:  Chief Complaint  Patient presents with  . Follow-up    Routine fu  . Medication Problem    Having side effects from prednisone, cannot take this med    HPI [facility] presents for routine follow up and lab review. He was prescribed Prednisone after his visit last week, he reports that he couldn't tolerate medication and self discontinued it. He reports nausea and light headedness after taking prednisone. Currently, he denies shortness of breath, chest pain, but admits to having intermittent productive cough with small amount of clear phlegm and cough doesn't wake him up at night. He continues to smoke 1/2 pack of cigarette daily and admits the desire to quit. He was seen at the CHF clinic by [facility] on 09/12/20 and he checks his weight daily and adheres to medication regimen and continues to make healthy lifestyle changes.  His HgbA1c done on 09/27/20 was 6%, his LDL was 114 mg/dl, and HDL was 31 mg/dl.  He also c/o bilateral intermittent non traumatic leg pain that originates from his thigh and radiates to the ankle. He states that pain has been going on for over one year. He states that the pain in his right leg is worst than left. He states that walking aggravates pain and he describes pain as burning and aching. He states that sitting and taking Advil as needed minimally relieves symptoms. He denies muscle or motor weakness. Overall, he states that he's doing well and offers no further complaint.  Past Medical History:  Diagnosis Date  . CAD (coronary artery disease)   . CHF (congestive heart failure) (HCC)   . GERD (gastroesophageal reflux disease)   . Hypertension   . MI, old   . Sleep apnea     Past Surgical History:  Procedure Laterality Date  . CARDIAC CATHETERIZATION Right 10/16/2015   Procedure: Left Heart Cath and Coronary  Angiography;  Surgeon: Laurier Nancy, MD;  Location: [facility] INVASIVE CV LAB;  Service: Cardiovascular;  Laterality: Right;  . CORONARY ANGIOPLASTY WITH STENT PLACEMENT  approx 2 years ago  . HERNIA REPAIR Left 11/29/2016   [facility]  . PARTIAL NEPHRECTOMY Left   . SPLENECTOMY      Family History  Problem Relation Age of Onset  . Heart failure Mother   . Hypertension Mother   . Other Mother        covid pneumonia  . Heart attack Father 14  . Diabetes Father   . Diabetes Mellitus II Brother     Social History   Socioeconomic History  . Marital status: Single    Spouse name: Not on file  . Number of children: Not on file  . Years of education: Not on file  . Highest education level: Not on file  Occupational History  . Occupation: unemployed  Tobacco Use  . Smoking status: Current Every Day Smoker    Packs/day: 0.50    Types: Cigarettes  . Smokeless tobacco: Never Used  . Tobacco comment: noted that he cut back from 1 pack a day to 1/2 a pack a day  Vaping Use  . Vaping Use: Never used  Substance and Sexual Activity  . Alcohol use: No    Alcohol/week: 0.0 standard drinks  . Drug use: Not Currently    Comment: percocet  . Sexual activity: Not Currently  Other Topics Concern  . Not on file  Social History Narrative  . Not on file   Social Determinants of Health   Financial Resource Strain: Not on file  Food Insecurity: Not on file  Transportation Needs: Not on file  Physical Activity: Not on file  Stress: Not on file  Social Connections: Not on file  Intimate Partner Violence: Not on file    Outpatient Medications Prior to Visit  Medication Sig Dispense Refill  . aspirin 81 MG EC tablet Take 1 tablet (81 mg total) by mouth daily. 30 tablet 2  . atorvastatin (LIPITOR) 20 MG tablet Take 1 tablet (20 mg total) by mouth daily. 30 tablet 2  . furosemide (LASIX) 40 MG tablet Take 1 tablet (40 mg total) by mouth daily. 90 tablet 3  . potassium chloride (KLOR-CON) 10 MEQ  tablet Take 1 tablet (10 mEq total) by mouth daily. (Patient taking differently: Take 10 mEq by mouth daily. Patient reports he is taking half a pill (5mg ) daily) 90 tablet 3  . spironolactone (ALDACTONE) 25 MG tablet Take 1 tablet (25 mg total) by mouth daily. 90 tablet 3  . losartan (COZAAR) 50 MG tablet Take 1 tablet (50 mg total) by mouth daily. 30 tablet 5  . metoprolol succinate (TOPROL-XL) 50 MG 24 hr tablet Take 1 tablet (50 mg total) by mouth daily. Take with or immediately following a meal. 90 tablet 3  . predniSONE (DELTASONE) 20 MG tablet Take 2 tablets (40 mg total) by mouth daily with breakfast. (Patient not taking: Reported on 10/05/2020) 10 tablet 0   No facility-administered medications prior to visit.    Allergies  Allergen Reactions  . Entresto [Sacubitril-Valsartan] Cough    ROS Review of Systems  Constitutional: Negative.   Respiratory: Negative.   Cardiovascular: Negative.   Endocrine: Negative.   Musculoskeletal: Positive for arthralgias (bilateral leg pain).  Neurological: Negative.       Objective:    Physical Exam HENT:     Head: Normocephalic.  Eyes:     Extraocular Movements: Extraocular movements intact.     Conjunctiva/sclera: Conjunctivae normal.     Pupils: Pupils are equal, round, and reactive to light.  Cardiovascular:     Rate and Rhythm: Normal rate and regular rhythm.     Pulses: Normal pulses.     Heart sounds: Normal heart sounds.  Pulmonary:     Effort: Pulmonary effort is normal.     Breath sounds: Normal breath sounds.  Musculoskeletal:        General: Tenderness present.       Legs:  Skin:    General: Skin is warm.  Neurological:     General: No focal deficit present.     Mental Status: He is alert and oriented to person, place, and time. Mental status is at baseline.  Psychiatric:        Mood and Affect: Mood normal.        Behavior: Behavior normal.        Thought Content: Thought content normal.        Judgment:  Judgment normal.     BP 115/76 (BP Location: Left Arm, Patient Position: Sitting)   Pulse 72   Ht 5\' 8"  (1.727 m)   Wt 223 lb 9.6 oz (101.4 kg)   SpO2 95%   BMI 34.00 kg/m  Wt Readings from Last 3 Encounters:  10/05/20 223 lb 9.6 oz (101.4 kg)  09/27/20 219 lb 11.2 oz (99.7 kg)  09/12/20 219  lb (99.3 kg)   Encouraged weight loss  Health Maintenance Due  Topic Date Due  . Hepatitis C Screening  Never done  . COVID-19 Vaccine (1) Never done  . TETANUS/TDAP  Never done  . COLONOSCOPY (Pts 45-48yrs Insurance coverage will need to be confirmed)  Never done    There are no preventive care reminders to display for this patient.  Lab Results  Component Value Date   TSH 0.722 07/24/2018   Lab Results  Component Value Date   WBC 10.1 06/28/2020   HGB 16.5 06/28/2020   HCT 48.2 06/28/2020   MCV 93.6 06/28/2020   PLT 293 06/28/2020   Lab Results  Component Value Date   NA 140 08/31/2020   K 4.2 08/31/2020   CO2 23 08/31/2020   GLUCOSE 90 08/31/2020   BUN 16 08/31/2020   CREATININE 0.82 08/31/2020   BILITOT 0.3 08/31/2020   ALKPHOS 63 08/31/2020   AST 15 08/31/2020   ALT 17 08/31/2020   PROT 6.9 08/31/2020   ALBUMIN 4.0 08/31/2020   CALCIUM 8.9 08/31/2020   ANIONGAP 7 06/28/2020   Lab Results  Component Value Date   CHOL 169 08/31/2020   Lab Results  Component Value Date   HDL 31 (L) 08/31/2020   Lab Results  Component Value Date   LDLCALC 114 (H) 08/31/2020   Lab Results  Component Value Date   TRIG 134 08/31/2020   Lab Results  Component Value Date   CHOLHDL 5.5 (H) 08/31/2020   Lab Results  Component Value Date   HGBA1C 6.0 (H) 09/27/2020      Assessment & Plan:    1. Prediabetes - His HgbA1c was 6%, he declines Metformin therapy, states that he will make some dietary changes and exercise as tolerated.  - Will check HgB A1c; Future  2. Tobacco abuse - He was encouraged on smoking cessation and provided with [place] Quitline  information.  3. Elevated lipids -The 10-year ASCVD risk score Denman George DC Jr., et al., 2013) is: 12.5%   Values used to calculate the score:     Age: 31 years     Sex: Male     Is Non-Hispanic African American: No     Diabetic: No     Tobacco smoker: Yes     Systolic Blood Pressure: 115 mmHg     Is BP treated: Yes     HDL Cholesterol: 31 mg/dL     Total Cholesterol: 169 mg/dL His ASCVD risk was 32.9%, he will continue on current medication, low fat/cholesterol diet and exercise as tolerated. - Lipid panel; Future  4. Pain in both lower extremities - He will use Lidocaine and will follow up with [facility] Dr Justice Rocher. He was advised to go to the ED for worsening symptoms. - Lidocaine (HM LIDOCAINE PATCH) 4 % PTCH; Apply 1 patch topically every 12 (twelve) hours.  Dispense: 30 patch; Refill: 0     Follow-up: Return in about 6 weeks (around 11/16/2020), or if symptoms worsen or fail to improve.    Danylle Ouk Trellis Paganini, NP

## 2020-10-05 NOTE — Patient Instructions (Signed)
Prediabetes Eating Plan Prediabetes is a condition that causes blood sugar (glucose) levels to be higher than normal. This increases the risk for developing type 2 diabetes (type 2 diabetes mellitus). Working with a health care provider or nutrition specialist (dietitian) to make diet and lifestyle changes can help prevent the onset of diabetes. These changes may help you:  Control your blood glucose levels.  Improve your cholesterol levels.  Manage your blood pressure. What are tips for following this plan? Reading food labels  Read food labels to check the amount of fat, salt (sodium), and sugar in prepackaged foods. Avoid foods that have: ? Saturated fats. ? Trans fats. ? Added sugars.  Avoid foods that have more than 300 milligrams (mg) of sodium per serving. Limit your sodium intake to less than 2,300 mg each day. Shopping  Avoid buying pre-made and processed foods.  Avoid buying drinks with added sugar. Cooking  Cook with olive oil. Do not use butter, lard, or ghee.  Bake, broil, grill, steam, or boil foods. Avoid frying. Meal planning  Work with your dietitian to create an eating plan that is right for you. This may include tracking how many calories you take in each day. Use a food diary, notebook, or mobile application to track what you eat at each meal.  Consider following a Mediterranean diet. This includes: ? Eating several servings of fresh fruits and vegetables each day. ? Eating fish at least twice a week. ? Eating one serving each day of whole grains, beans, nuts, and seeds. ? Using olive oil instead of other fats. ? Limiting alcohol. ? Limiting red meat. ? Using nonfat or low-fat dairy products.  Consider following a plant-based diet. This includes dietary choices that focus on eating mostly vegetables and fruit, grains, beans, nuts, and seeds.  If you have high blood pressure, you may need to limit your sodium intake or follow a diet such as the DASH  (Dietary Approaches to Stop Hypertension) eating plan. The DASH diet aims to lower high blood pressure.   Lifestyle  Set weight loss goals with help from your health care team. It is recommended that most people with prediabetes lose 7% of their body weight.  Exercise for at least 30 minutes 5 or more days a week.  Attend a support group or seek support from a mental health counselor.  Take over-the-counter and prescription medicines only as told by your health care provider. What foods are recommended? Fruits Berries. Bananas. Apples. Oranges. Grapes. Papaya. Mango. Pomegranate. Kiwi. Grapefruit. Cherries. Vegetables Lettuce. Spinach. Peas. Beets. Cauliflower. Cabbage. Broccoli. Carrots. Tomatoes. Squash. Eggplant. Herbs. Peppers. Onions. Cucumbers. Brussels sprouts. Grains Whole grains, such as whole-wheat or whole-grain breads, crackers, cereals, and pasta. Unsweetened oatmeal. Bulgur. Barley. Quinoa. Brown rice. Corn or whole-wheat flour tortillas or taco shells. Meats and other proteins Seafood. Poultry without skin. Lean cuts of pork and beef. Tofu. Eggs. Nuts. Beans. Dairy Low-fat or fat-free dairy products, such as yogurt, cottage cheese, and cheese. Beverages Water. Tea. Coffee. Sugar-free or diet soda. Seltzer water. Low-fat or nonfat milk. Milk alternatives, such as soy or almond milk. Fats and oils Olive oil. Canola oil. Sunflower oil. Grapeseed oil. Avocado. Walnuts. Sweets and desserts Sugar-free or low-fat pudding. Sugar-free or low-fat ice cream and other frozen treats. Seasonings and condiments Herbs. Sodium-free spices. Mustard. Relish. Low-salt, low-sugar ketchup. Low-salt, low-sugar barbecue sauce. Low-fat or fat-free mayonnaise. The items listed above may not be a complete list of recommended foods and beverages. Contact a dietitian for more   information. What foods are not recommended? Fruits Fruits canned with syrup. Vegetables Canned vegetables. Frozen  vegetables with butter or cream sauce. Grains Refined white flour and flour products, such as bread, pasta, snack foods, and cereals. Meats and other proteins Fatty cuts of meat. Poultry with skin. Breaded or fried meat. Processed meats. Dairy Full-fat yogurt, cheese, or milk. Beverages Sweetened drinks, such as iced tea and soda. Fats and oils Butter. Lard. Ghee. Sweets and desserts Baked goods, such as cake, cupcakes, pastries, cookies, and cheesecake. Seasonings and condiments Spice mixes with added salt. Ketchup. Barbecue sauce. Mayonnaise. The items listed above may not be a complete list of foods and beverages that are not recommended. Contact a dietitian for more information. Where to find more information  American Diabetes Association: www.diabetes.org Summary  You may need to make diet and lifestyle changes to help prevent the onset of diabetes. These changes can help you control blood sugar, improve cholesterol levels, and manage blood pressure.  Set weight loss goals with help from your health care team. It is recommended that most people with prediabetes lose 7% of their body weight.  Consider following a Mediterranean diet. This includes eating plenty of fresh fruits and vegetables, whole grains, beans, nuts, seeds, fish, and low-fat dairy, and using olive oil instead of other fats. This information is not intended to replace advice given to you by your health care provider. Make sure you discuss any questions you have with your health care provider. Document Revised: 09/16/2019 Document Reviewed: 09/16/2019 Elsevier Patient Education  2021 Elsevier Inc.  

## 2020-10-11 ENCOUNTER — Ambulatory Visit: Payer: Self-pay | Admitting: Family

## 2020-10-11 NOTE — Progress Notes (Deleted)
Patient ID: Brian Porter, male    DOB: 1965/12/20, 55 y.o.   MRN: [id number]   Mr Degollado is a 55 y/o male with a history of obstructive sleep apnea (without CPAP), CAD, HTN, GERD, MI, current tobacco use and chronic heart failure.   Echo report from 05/24/2019 reviewed and showed an EF of 30-35% along with trivial TR/PR. Echo done 10/15/15 and showed an EF of 35% along with moderate MR/TR and an elevated PA pressure of 48 mm Hg.   Had a cardiac catheterization done 10/16/15 and showed an EF of 15% along with multi-vessel disease. Recommended treatment for cardiomyopathy.   Was in the ED 06/28/20 due to dizziness and shortness of breath. Antibiotics started for possible pneumonia. Tested covid + and he was released.   He presents today for a follow-up visit with a chief complaint of  Past Medical History:  Diagnosis Date  . CAD (coronary artery disease)   . CHF (congestive heart failure) (HCC)   . GERD (gastroesophageal reflux disease)   . Hypertension   . MI, old   . Sleep apnea    Past Surgical History:  Procedure Laterality Date  . CARDIAC CATHETERIZATION Right 10/16/2015   Procedure: Left Heart Cath and Coronary Angiography;  Surgeon: Laurier Nancy, MD;  Location: [facility] INVASIVE CV LAB;  Service: Cardiovascular;  Laterality: Right;  . CORONARY ANGIOPLASTY WITH STENT PLACEMENT  approx 2 years ago  . HERNIA REPAIR Left 11/29/2016   [facility]  . PARTIAL NEPHRECTOMY Left   . SPLENECTOMY     Family History  Problem Relation Age of Onset  . Heart failure Mother   . Hypertension Mother   . Other Mother        covid pneumonia  . Heart attack Father 22  . Diabetes Father   . Diabetes Mellitus II Brother    Social History   Tobacco Use  . Smoking status: Current Every Day Smoker    Packs/day: 0.50    Types: Cigarettes  . Smokeless tobacco: Never Used  . Tobacco comment: noted that he cut back from 1 pack a day to 1/2 a pack a day  Substance Use Topics  . Alcohol use: No     Alcohol/week: 0.0 standard drinks   Allergies  Allergen Reactions  . Entresto [Sacubitril-Valsartan] Cough     Review of Systems  Constitutional: Negative for appetite change and fatigue.  HENT: Negative for congestion, postnasal drip and sore throat.   Eyes: Negative.   Respiratory: Positive for cough (intermittent). Negative for chest tightness and shortness of breath.   Cardiovascular: Negative for chest pain, palpitations and leg swelling.  Gastrointestinal: Negative for abdominal distention.  Endocrine: Negative.   Genitourinary: Negative.   Musculoskeletal: Positive for myalgias (right leg). Negative for back pain.  Skin: Negative.   Allergic/Immunologic: Negative.   Neurological: Negative for dizziness and light-headedness.  Hematological: Negative for adenopathy. Does not bruise/bleed easily.  Psychiatric/Behavioral: Negative for dysphoric mood, sleep disturbance (sleeping on 2 pillows) and suicidal ideas. The patient is not nervous/anxious.     Physical Exam Vitals and nursing note reviewed.  Constitutional:      Appearance: He is well-developed.  HENT:     Head: Normocephalic and atraumatic.  Neck:     Vascular: No JVD.  Cardiovascular:     Rate and Rhythm: Normal rate and regular rhythm.  Pulmonary:     Effort: Pulmonary effort is normal.     Breath sounds: No wheezing or rales.  Abdominal:  General: There is no distension.     Palpations: Abdomen is soft.     Tenderness: There is no abdominal tenderness.  Musculoskeletal:        General: No tenderness.     Cervical back: Normal range of motion and neck supple.  Skin:    General: Skin is warm and dry.  Neurological:     Mental Status: He is alert and oriented to person, place, and time.  Psychiatric:        Behavior: Behavior normal.        Thought Content: Thought content normal.       Assessment & Plan:   1: Chronic heart failure with reduced ejection fraction- - NYHA class I - euvolemic in  appearance  - weighing daily. Reminded to call for an overnight weight gain of >2 pounds or a weekly weight gain of >5 pounds.  - weight 219d since he was last here 1 month ago - discussed the following GDMT if able: titrate losartan / metoprolol and adding SGLT2 - not adding salt to his food and tries to follow a 2000mg  sodium diet - BNP 07/24/2018 was 207.0  2: HTN- - BP  - saw provider at [facility] 10/05/20 - reviewed BMP from 08/31/20 which showed sodium 140, potassium 4.2, creatinine 0.82 and GFR 104 - consider repeating labs in a month and possibly stopping potassium supplement  3: Tobacco use- - smokes 1/2 ppd of cigarettes - complete cessation discussed for 3 minutes with him   Patient did not bring his medications nor a list. Each medication was verbally reviewed with the patient and he was encouraged to bring the bottles to every visit to confirm accuracy of list.

## 2020-10-16 ENCOUNTER — Emergency Department
Admission: EM | Admit: 2020-10-16 | Discharge: 2020-10-16 | Disposition: A | Discharge status: Home / Self Care | Payer: Self-pay | Attending: Emergency Medicine | Admitting: Emergency Medicine

## 2020-10-16 ENCOUNTER — Other Ambulatory Visit: Payer: Self-pay

## 2020-10-16 ENCOUNTER — Emergency Department: Payer: Self-pay

## 2020-10-16 DIAGNOSIS — Z20822 Contact with and (suspected) exposure to covid-19: Secondary | ICD-10-CM | POA: Insufficient documentation

## 2020-10-16 DIAGNOSIS — F17210 Nicotine dependence, cigarettes, uncomplicated: Secondary | ICD-10-CM | POA: Insufficient documentation

## 2020-10-16 DIAGNOSIS — J069 Acute upper respiratory infection, unspecified: Secondary | ICD-10-CM | POA: Insufficient documentation

## 2020-10-16 DIAGNOSIS — Z7982 Long term (current) use of aspirin: Secondary | ICD-10-CM | POA: Insufficient documentation

## 2020-10-16 DIAGNOSIS — I110 Hypertensive heart disease with heart failure: Secondary | ICD-10-CM | POA: Insufficient documentation

## 2020-10-16 DIAGNOSIS — R42 Dizziness and giddiness: Secondary | ICD-10-CM | POA: Insufficient documentation

## 2020-10-16 DIAGNOSIS — Z79899 Other long term (current) drug therapy: Secondary | ICD-10-CM | POA: Insufficient documentation

## 2020-10-16 DIAGNOSIS — I2510 Atherosclerotic heart disease of native coronary artery without angina pectoris: Secondary | ICD-10-CM | POA: Insufficient documentation

## 2020-10-16 DIAGNOSIS — I5023 Acute on chronic systolic (congestive) heart failure: Secondary | ICD-10-CM | POA: Insufficient documentation

## 2020-10-16 LAB — CBC
HCT: 45.2 % (ref 39.0–52.0)
Hemoglobin: 15.6 g/dL (ref 13.0–17.0)
MCH: 32.5 pg (ref 26.0–34.0)
MCHC: 34.5 g/dL (ref 30.0–36.0)
MCV: 94.2 fL (ref 80.0–100.0)
Platelets: 365 10*3/uL (ref 150–400)
RBC: 4.8 MIL/uL (ref 4.22–5.81)
RDW: 13.6 % (ref 11.5–15.5)
WBC: 12.5 10*3/uL — ABNORMAL HIGH (ref 4.0–10.5)
nRBC: 0 % (ref 0.0–0.2)

## 2020-10-16 LAB — BASIC METABOLIC PANEL
Anion gap: 8 (ref 5–15)
BUN: 15 mg/dL (ref 6–20)
CO2: 25 mmol/L (ref 22–32)
Calcium: 8.8 mg/dL — ABNORMAL LOW (ref 8.9–10.3)
Chloride: 105 mmol/L (ref 98–111)
Creatinine, Ser: 0.92 mg/dL (ref 0.61–1.24)
GFR, Estimated: >60 mL/min (ref >60)
Glucose, Bld: 106 mg/dL — ABNORMAL HIGH (ref 70–99)
Potassium: 3.9 mmol/L (ref 3.5–5.1)
Sodium: 138 mmol/L (ref 135–145)

## 2020-10-16 LAB — RESP PANEL BY RT-PCR (FLU A&B, COVID) ARPGX2
Influenza A by PCR: NEGATIVE
Influenza B by PCR: NEGATIVE
SARS Coronavirus 2 by RT PCR: NEGATIVE

## 2020-10-16 IMAGING — CR DG CHEST 2V
2 series · 2 of 2 positions shown · non-contrast
Comparison: [DATE]

CLINICAL DATA: Dizziness for 3-4 days, chills today, history
hypertension, coronary artery disease post MI, CHF, GERD

EXAM:
CHEST - 2 VIEW

[chest pa]
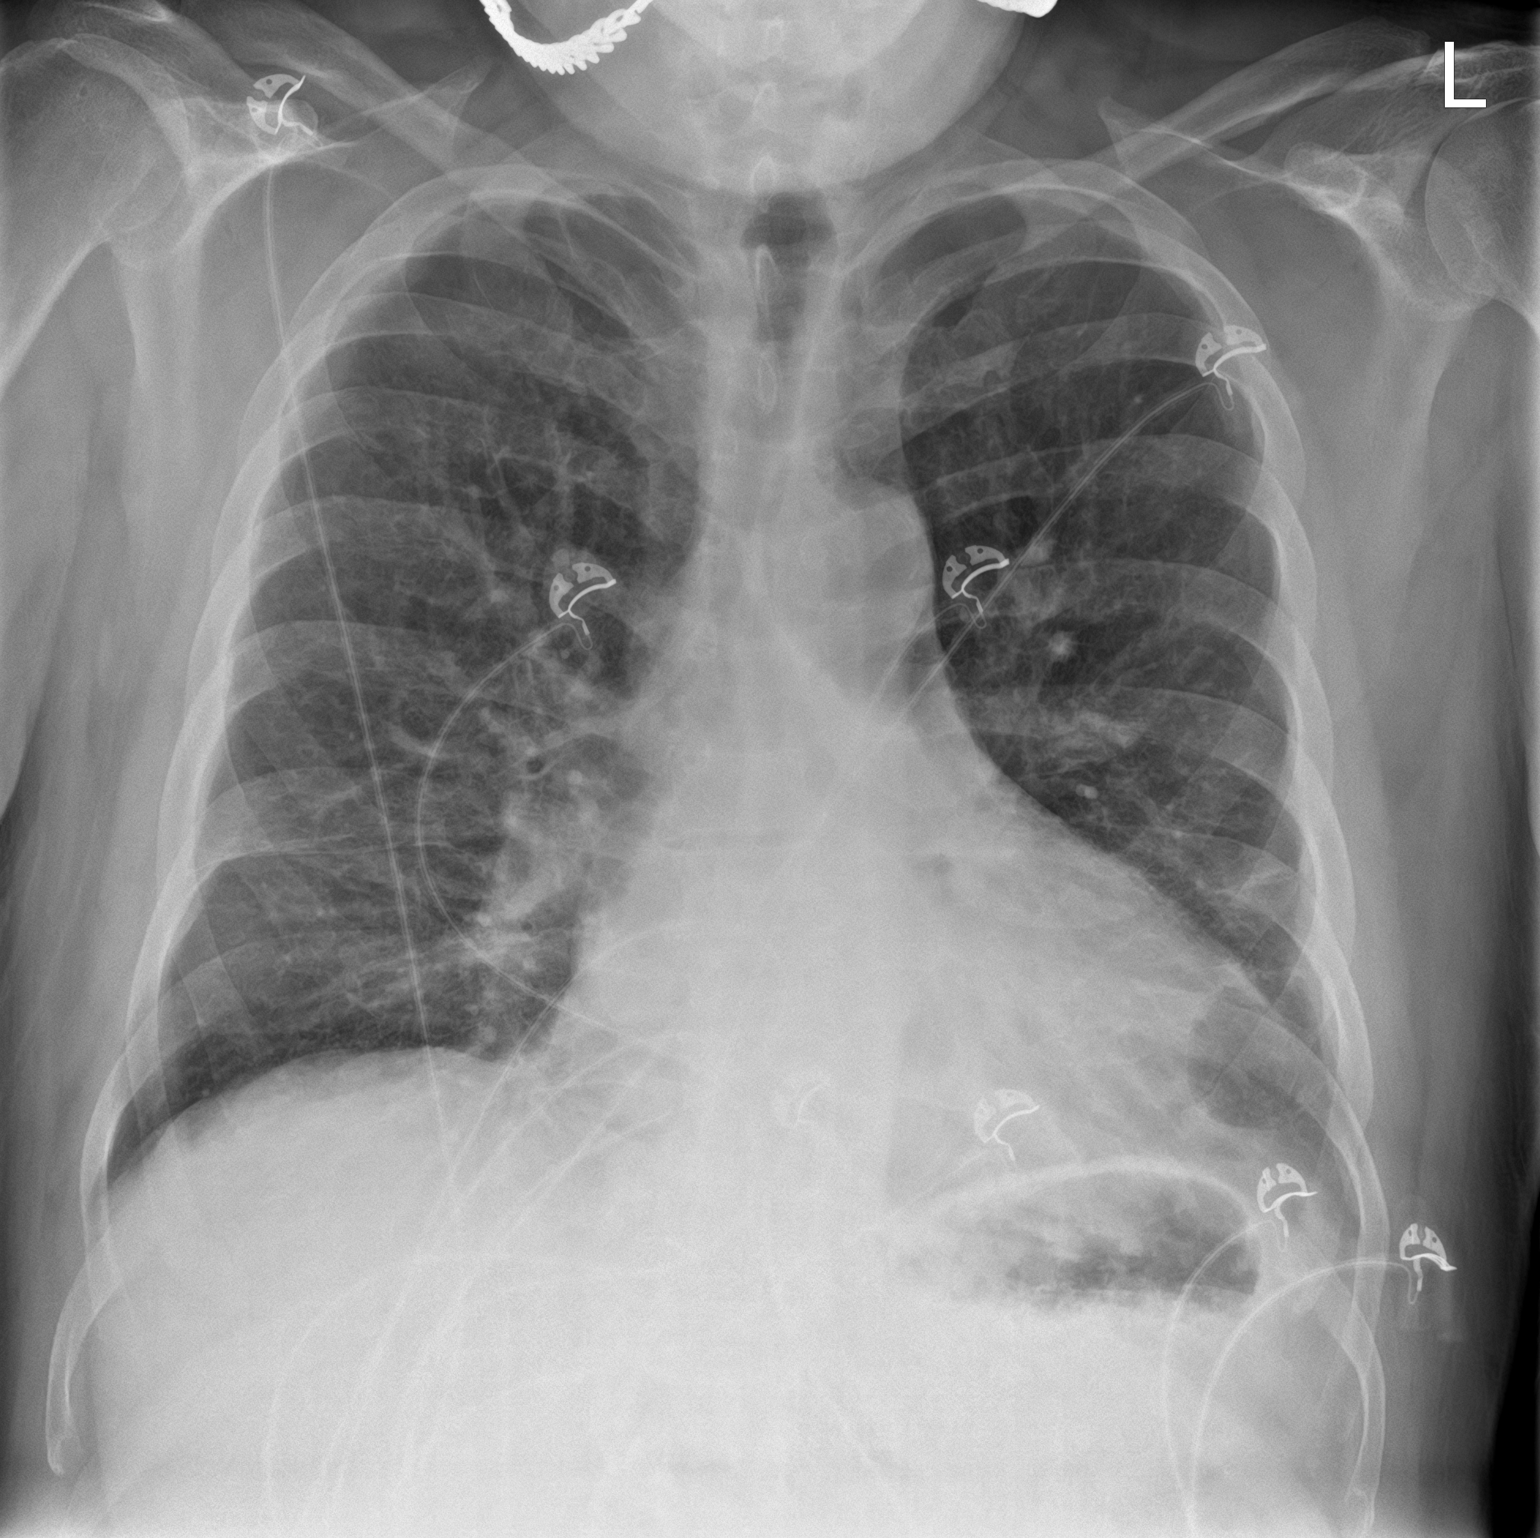

[chest lat]
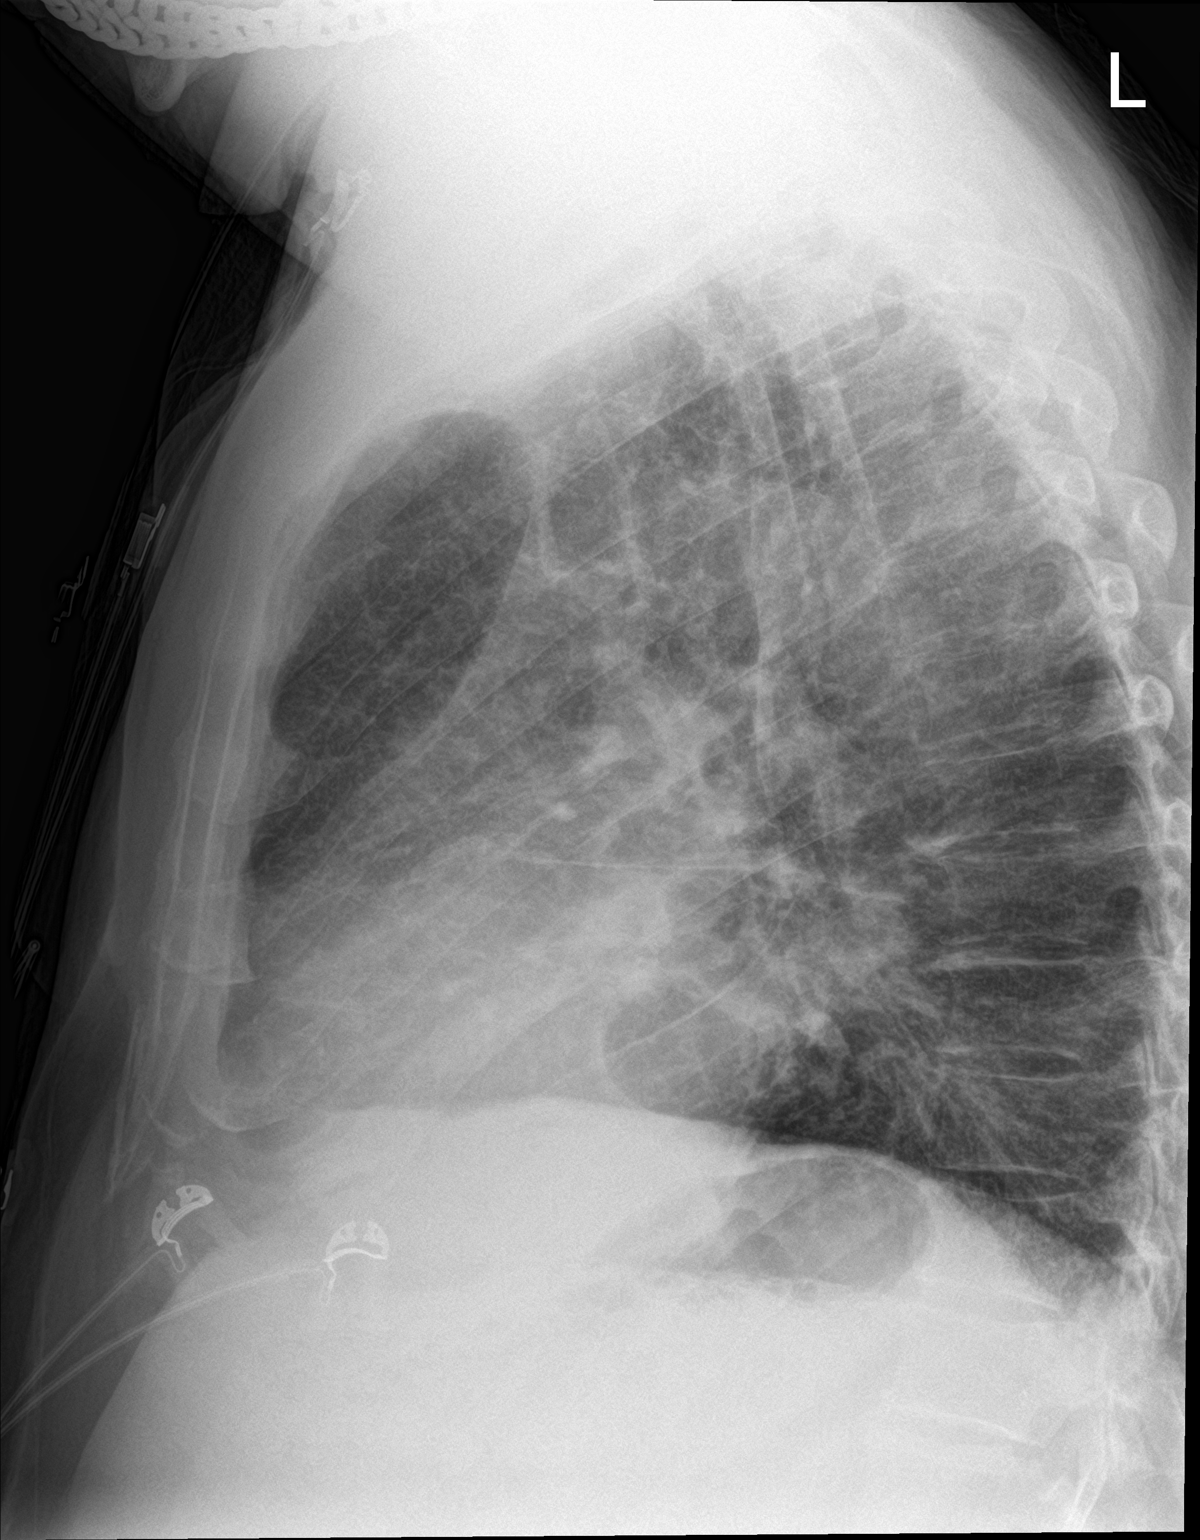

[2 of 2 positions shown; findings below may reference images not displayed]

FINDINGS: Enlargement of cardiac silhouette.

Mediastinal contours and pulmonary vascularity normal.

Bleb at LEFT base unchanged.

Minimal bibasilar atelectasis.

No acute infiltrate, pleural effusion, or pneumothorax.

Osseous structures unremarkable.
IMPRESSION: Minimal bibasilar atelectasis without acute infiltrate.

## 2020-10-16 MED ORDER — AZITHROMYCIN 250 MG PO TABS: ORAL_TABLET | ORAL | 0 refills | Status: DC

## 2020-10-16 NOTE — Discharge Instructions (Signed)
Your chest x-ray did not show any acute pneumonia however please finish this course of antibiotics given the duration of your upper respiratory infectious symptoms

## 2020-10-16 NOTE — ED Provider Notes (Signed)
[facility] Emergency Department Provider Note   ____________________________________________   Event Date/Time   First MD Initiated Contact with Patient 10/16/20 1529     (approximate)  I have reviewed the triage vital signs and the nursing notes.   HISTORY  Chief Complaint Dizziness    HPI Brian Porter is a 55 y.o. male with a past medical history of CAD, CHF, GERD, and hypertension who presents for cough, lightheadedness, and chills.  Patient states that he has had a nonproductive cough for the last 2 weeks that initially improved and now has worsened again over the last 3-4 days.  This worsening is also been associated with orthostatic lightheadedness as well as dyspnea on exertion.  Patient continues to state that he is not coughing anything up but has been feeling chills over the last 24 hours.  Denies any recent travel or sick contacts.  Patient currently denies any vision changes, tinnitus, difficulty speaking, facial droop, sore throat, chest pain, abdominal pain, nausea/vomiting/diarrhea, dysuria, or weakness/numbness/paresthesias in any extremity         Past Medical History:  Diagnosis Date  . CAD (coronary artery disease)   . CHF (congestive heart failure) (HCC)   . GERD (gastroesophageal reflux disease)   . Hypertension   . MI, old   . Sleep apnea     Patient Active Problem List   Diagnosis Date Noted  . Prediabetes 10/05/2020  . Leg pain 10/05/2020  . CAP (community acquired pneumonia) 07/24/2018  . Bilateral recurrent inguinal hernia without obstruction or gangrene   . Acute on chronic systolic heart failure (HCC) 08/27/2016  . HTN (hypertension) 11/02/2015  . Tobacco abuse 11/02/2015  . Hand joint pain 11/02/2015  . CHF (congestive heart failure) (HCC) 10/15/2015    Past Surgical History:  Procedure Laterality Date  . CARDIAC CATHETERIZATION Right 10/16/2015   Procedure: Left Heart Cath and Coronary Angiography;  Surgeon:  Laurier Nancy, MD;  Location: [facility] INVASIVE CV LAB;  Service: Cardiovascular;  Laterality: Right;  . CORONARY ANGIOPLASTY WITH STENT PLACEMENT  approx 2 years ago  . HERNIA REPAIR Left 11/29/2016   [facility]  . PARTIAL NEPHRECTOMY Left   . SPLENECTOMY      Prior to Admission medications   Medication Sig Start Date End Date Taking? Authorizing Provider  azithromycin (ZITHROMAX Z-PAK) 250 MG tablet Take 2 tablets (500 mg) on  Day 1,  followed by 1 tablet (250 mg) once daily on Days 2 through 5. 10/16/20 10/21/20 Yes Shayne Deerman, Clent Jacks, MD  aspirin 81 MG EC tablet Take 1 tablet (81 mg total) by mouth daily. 08/29/16   Houston Siren, MD  atorvastatin (LIPITOR) 20 MG tablet Take 1 tablet (20 mg total) by mouth daily. 09/27/20   Tukov-Yual, Alroy Bailiff, NP  furosemide (LASIX) 40 MG tablet Take 1 tablet (40 mg total) by mouth daily. 09/12/20   Clarisa Kindred A, FNP  Lidocaine (HM LIDOCAINE PATCH) 4 % PTCH Apply 1 patch topically every 12 (twelve) hours. 10/05/20   Iloabachie, Chioma E, NP  losartan (COZAAR) 50 MG tablet Take 1 tablet (50 mg total) by mouth daily. 10/08/19 03/22/20  Delma Freeze, FNP  metoprolol succinate (TOPROL-XL) 50 MG 24 hr tablet Take 1 tablet (50 mg total) by mouth daily. Take with or immediately following a meal. 09/02/19 03/22/20  Clarisa Kindred A, FNP  potassium chloride (KLOR-CON) 10 MEQ tablet Take 1 tablet (10 mEq total) by mouth daily. Patient taking differently: Take 10 mEq by mouth  daily. Patient reports he is taking half a pill (5mg ) daily 04/23/19   04/25/19 A, FNP  spironolactone (ALDACTONE) 25 MG tablet Take 1 tablet (25 mg total) by mouth daily. 08/11/20 11/09/20  01/09/21, FNP    Allergies Delma Freeze [sacubitril-valsartan]  Family History  Problem Relation Age of Onset  . Heart failure Mother   . Hypertension Mother   . Other Mother        covid pneumonia  . Heart attack Father 74  . Diabetes Father   . Diabetes Mellitus II Brother     Social History Social  History   Tobacco Use  . Smoking status: Current Every Day Smoker    Packs/day: 0.50    Types: Cigarettes  . Smokeless tobacco: Never Used  . Tobacco comment: noted that he cut back from 1 pack a day to 1/2 a pack a day  Vaping Use  . Vaping Use: Never used  Substance Use Topics  . Alcohol use: No    Alcohol/week: 0.0 standard drinks  . Drug use: Not Currently    Comment: percocet    Review of Systems Constitutional: No fever/chills Eyes: No visual changes. ENT: No sore throat. Cardiovascular: Denies chest pain. Respiratory: Endorses shortness of breath.  Endorses dry cough Gastrointestinal: No abdominal pain.  No nausea, no vomiting.  No diarrhea. Genitourinary: Negative for dysuria. Musculoskeletal: Negative for acute arthralgias Skin: Negative for rash. Neurological: Negative for headaches, weakness/numbness/paresthesias in any extremity Psychiatric: Negative for suicidal ideation/homicidal ideation   ____________________________________________   PHYSICAL EXAM:  VITAL SIGNS: ED Triage Vitals [10/16/20 1515]  Enc Vitals Group     BP (!) 154/80     Pulse Rate 65     Resp 17     Temp 98.2 F (36.8 C)     Temp Source Oral     SpO2 97 %     Weight 218 lb (98.9 kg)     Height 5\' 8"  (1.727 m)     Head Circumference      Peak Flow      Pain Score 0     Pain Loc      Pain Edu?      Excl. in GC?    Constitutional: Alert and oriented. Well appearing and in no acute distress. Eyes: Conjunctivae are normal. PERRL. Head: Atraumatic. Nose: No congestion/rhinnorhea. Mouth/Throat: Mucous membranes are moist. Neck: No stridor Cardiovascular: Grossly normal heart sounds.  Good peripheral circulation. Respiratory: Normal respiratory effort.  No retractions.  Rhonchi over right lower lung field Gastrointestinal: Soft and nontender. No distention. Musculoskeletal: No obvious deformities Neurologic:  Normal speech and language. No gross focal neurologic deficits are  appreciated. Skin:  Skin is warm and dry. No rash noted. Psychiatric: Mood and affect are normal. Speech and behavior are normal.  ____________________________________________   LABS (all labs ordered are listed, but only abnormal results are displayed)  Labs Reviewed  BASIC METABOLIC PANEL - Abnormal; Notable for the following components:      Result Value   Glucose, Bld 106 (*)    Calcium 8.8 (*)    All other components within normal limits  CBC - Abnormal; Notable for the following components:   WBC 12.5 (*)    All other components within normal limits  RESP PANEL BY RT-PCR (FLU A&B, COVID) ARPGX2  URINALYSIS, COMPLETE (UACMP) WITH MICROSCOPIC  CBG MONITORING, ED   ____________________________________________  EKG  ED ECG REPORT I, 10/18/20, the attending physician, personally viewed and interpreted  this ECG.  Date: 10/16/2020 EKG Time: 1522 Rate: 62 Rhythm: normal sinus rhythm QRS Axis: normal Intervals: normal ST/T Wave abnormalities: normal Narrative Interpretation: no evidence of acute ischemia  ____________________________________________  RADIOLOGY  ED MD interpretation: 2 view chest x-ray shows bibasilar atelectasis without any acute infiltrate  Official radiology report(s): DG Chest 2 View  Result Date: 10/16/2020 CLINICAL DATA:  Dizziness for 3-4 days, chills today, history hypertension, coronary artery disease post MI, CHF, GERD EXAM: CHEST - 2 VIEW COMPARISON:  06/28/2020 FINDINGS: Enlargement of cardiac silhouette. Mediastinal contours and pulmonary vascularity normal. Bleb at LEFT base unchanged. Minimal bibasilar atelectasis. No acute infiltrate, pleural effusion, or pneumothorax. Osseous structures unremarkable. IMPRESSION: Minimal bibasilar atelectasis without acute infiltrate. Electronically Signed   By: Ulyses Southward M.D.   On: 10/16/2020 17:39    ____________________________________________   PROCEDURES  Procedure(s) performed  (including Critical Care):  .1-3 Lead EKG Interpretation Performed by: Merwyn Katos, MD Authorized by: Merwyn Katos, MD     Interpretation: normal     ECG rate:  61   ECG rate assessment: normal     Rhythm: sinus rhythm     Ectopy: none     Conduction: normal       ____________________________________________   INITIAL IMPRESSION / ASSESSMENT AND PLAN / ED COURSE  As part of my medical decision making, I reviewed the following data within the electronic MEDICAL RECORD NUMBER Nursing notes reviewed and incorporated, Labs reviewed, EKG interpreted, Old chart reviewed, Radiograph reviewed and Notes from prior ED visits reviewed and incorporated        Otherwise healthy patient presenting with constellation of symptoms likely representing uncomplicated viral upper respiratory symptoms as characterized by mild pharyngitis  Unlikely PTA/RPA: no hot potato voice, no uvular deviation, Unlikely Esophageal rupture: No history of dysphagia Unlikely deep space infection/Ludwigs Low suspicion for CNS infection bacterial sinusitis, or pneumonia given exam and history.  Unlikely Strep or EBV as centor negative and with no pharyngeal exudate, posterior LAD, or splenomegaly.  Will attempt to alleviate symptoms conservatively; no overt indications at this time for antibiotics. No respiratory distress, otherwise relatively well appearing and nontoxic. Will discuss prompt follow up with PMD and strict return precautions.      ____________________________________________   FINAL CLINICAL IMPRESSION(S) / ED DIAGNOSES  Final diagnoses:  Lightheadedness  Upper respiratory tract infection, unspecified type     ED Discharge Orders         Ordered    azithromycin (ZITHROMAX Z-PAK) 250 MG tablet        10/16/20 1747           Note:  This document was prepared using Dragon voice recognition software and may include unintentional dictation errors.   Merwyn Katos, MD 10/16/20  [phone]

## 2020-10-16 NOTE — ED Triage Notes (Signed)
Pt c/o dizziness for the past 3-4 days, chills today, denies pain or emesis

## 2020-10-17 ENCOUNTER — Ambulatory Visit: Payer: Self-pay | Admitting: Specialist

## 2020-10-17 NOTE — Progress Notes (Unsigned)
HPI 1 yr hx of bilateral leg pain. R > L. No N/T. He has had a concurred URI and received antibiotics yesterday. Activity +/- causes pain to be worse. He thinks he may be getting somewhat better.   PHYSICAL ASSESSMENT His gait is normal. He is able to heel/toe walk. He is able to march in place with normal muscle recruitment and relaxation. He is able to stand on each leg independently with a neg trendelenburg sign.  In the seated position DTRs 2+ =. Toe signs downward. There is clonus present.   MMT 5/5 sensation in tact.   Bilateral leg pain. I doubt there's an orthopedic origin. Return PRN.  PLAN Return PRN.

## 2020-10-19 ENCOUNTER — Other Ambulatory Visit: Payer: Self-pay

## 2020-10-19 ENCOUNTER — Emergency Department
Admission: EM | Admit: 2020-10-19 | Discharge: 2020-10-19 | Disposition: A | Discharge status: Home / Self Care | Payer: Self-pay | Attending: Emergency Medicine | Admitting: Emergency Medicine

## 2020-10-19 ENCOUNTER — Emergency Department: Payer: Self-pay

## 2020-10-19 DIAGNOSIS — Z79899 Other long term (current) drug therapy: Secondary | ICD-10-CM | POA: Insufficient documentation

## 2020-10-19 DIAGNOSIS — R5383 Other fatigue: Secondary | ICD-10-CM | POA: Insufficient documentation

## 2020-10-19 DIAGNOSIS — Z7982 Long term (current) use of aspirin: Secondary | ICD-10-CM | POA: Insufficient documentation

## 2020-10-19 DIAGNOSIS — R42 Dizziness and giddiness: Secondary | ICD-10-CM | POA: Insufficient documentation

## 2020-10-19 DIAGNOSIS — I110 Hypertensive heart disease with heart failure: Secondary | ICD-10-CM | POA: Insufficient documentation

## 2020-10-19 DIAGNOSIS — I5023 Acute on chronic systolic (congestive) heart failure: Secondary | ICD-10-CM | POA: Insufficient documentation

## 2020-10-19 DIAGNOSIS — I2510 Atherosclerotic heart disease of native coronary artery without angina pectoris: Secondary | ICD-10-CM | POA: Insufficient documentation

## 2020-10-19 DIAGNOSIS — F17210 Nicotine dependence, cigarettes, uncomplicated: Secondary | ICD-10-CM | POA: Insufficient documentation

## 2020-10-19 DIAGNOSIS — R531 Weakness: Secondary | ICD-10-CM | POA: Insufficient documentation

## 2020-10-19 LAB — URINALYSIS, COMPLETE (UACMP) WITH MICROSCOPIC
Bacteria, UA: NONE SEEN
Bilirubin Urine: NEGATIVE
Glucose, UA: NEGATIVE mg/dL
Hgb urine dipstick: NEGATIVE
Ketones, ur: NEGATIVE mg/dL
Leukocytes,Ua: NEGATIVE
Nitrite: NEGATIVE
Protein, ur: NEGATIVE mg/dL
Specific Gravity, Urine: 1.017 (ref 1.005–1.030)
Squamous Epithelial / HPF: NONE SEEN (ref 0–5)
pH: 6 (ref 5.0–8.0)

## 2020-10-19 LAB — COMPREHENSIVE METABOLIC PANEL
ALT: 19 U/L (ref 0–44)
AST: 17 U/L (ref 15–41)
Albumin: 3.8 g/dL (ref 3.5–5.0)
Alkaline Phosphatase: 52 U/L (ref 38–126)
Anion gap: 7 (ref 5–15)
BUN: 18 mg/dL (ref 6–20)
CO2: 25 mmol/L (ref 22–32)
Calcium: 9 mg/dL (ref 8.9–10.3)
Chloride: 106 mmol/L (ref 98–111)
Creatinine, Ser: 0.7 mg/dL (ref 0.61–1.24)
GFR, Estimated: >60 mL/min (ref >60)
Glucose, Bld: 86 mg/dL (ref 70–99)
Potassium: 3.8 mmol/L (ref 3.5–5.1)
Sodium: 138 mmol/L (ref 135–145)
Total Bilirubin: 0.7 mg/dL (ref 0.3–1.2)
Total Protein: 7.1 g/dL (ref 6.5–8.1)

## 2020-10-19 LAB — TROPONIN I (HIGH SENSITIVITY)
Troponin I (High Sensitivity): 14 ng/L (ref <18)
Troponin I (High Sensitivity): 16 ng/L (ref <18)

## 2020-10-19 LAB — CBC
HCT: 44.8 % (ref 39.0–52.0)
Hemoglobin: 15.6 g/dL (ref 13.0–17.0)
MCH: 32.3 pg (ref 26.0–34.0)
MCHC: 34.8 g/dL (ref 30.0–36.0)
MCV: 92.8 fL (ref 80.0–100.0)
Platelets: 372 10*3/uL (ref 150–400)
RBC: 4.83 MIL/uL (ref 4.22–5.81)
RDW: 13.7 % (ref 11.5–15.5)
WBC: 14.2 10*3/uL — ABNORMAL HIGH (ref 4.0–10.5)
nRBC: 0 % (ref 0.0–0.2)

## 2020-10-19 IMAGING — MR MR HEAD W/O CM
10 of 13 series · 36 of 48 positions shown · non-contrast
Comparison: CT [DATE].

CLINICAL DATA: Dizziness, nonspecific. Additional history provided:
treated [REDACTED], given antibiotic for respiratory infection. Patient
reports both legs weak and SEVDALIN.

EXAM:
MRI HEAD WITHOUT CONTRAST
TECHNIQUE: Multiplanar, multiecho pulse sequences of the brain and surrounding
structures were obtained without intravenous contrast.

[Series 5: ax dwi_tracew · axial · 3.0mm · 0.65mm/px · z∈[-95,+64]mm · 6 of 50 slices shown]
[im 1/50]
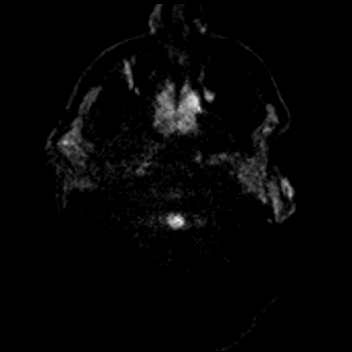
[im 10/50]
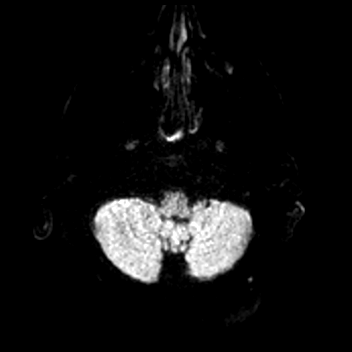
[im 20/50]
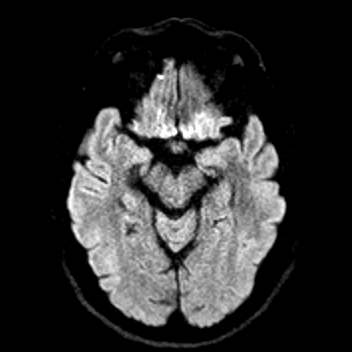
[im 30/50]
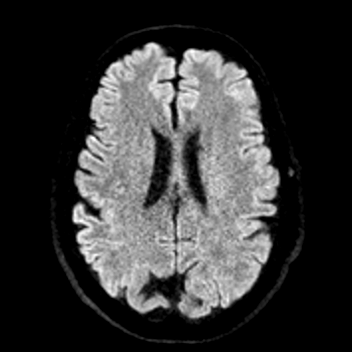
[im 40/50]
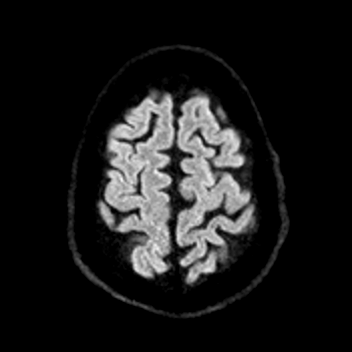
[im 50/50]
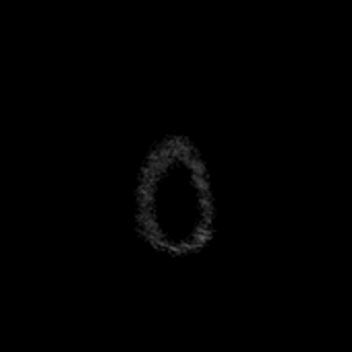

[Series 6: ax dwi_adc · axial · 3.0mm · 0.65mm/px · z∈[-95,+22]mm · 4 of 50 slices shown]
[im 1/50]
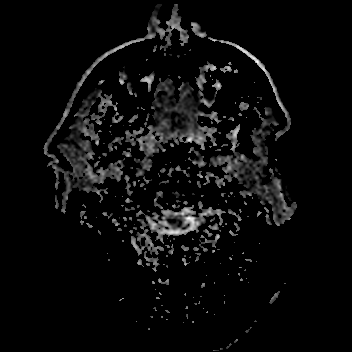
[im 13/50]
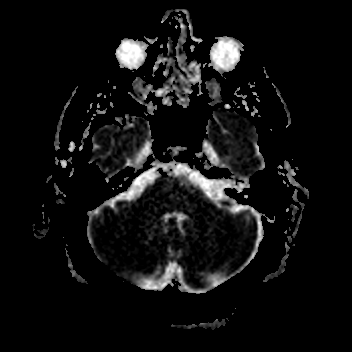
[im 25/50]
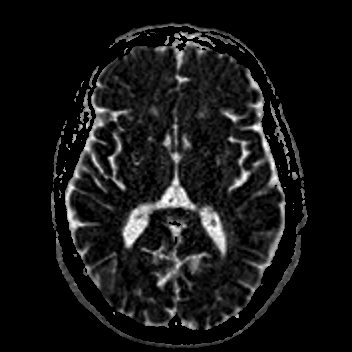
[im 37/50]
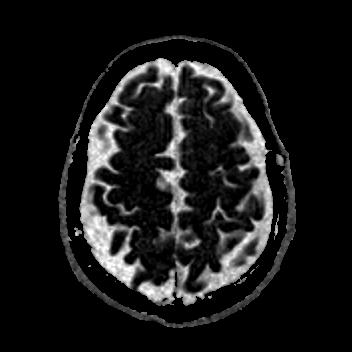

[Series 9: T1 · sagittal · 5.0mm · 0.62mm/px · 3 of 25 slices shown (1 of 4)]
[im 1/25]
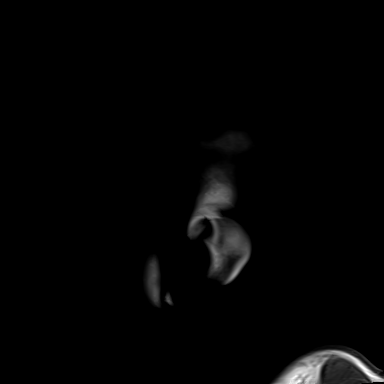
[im 13/25]
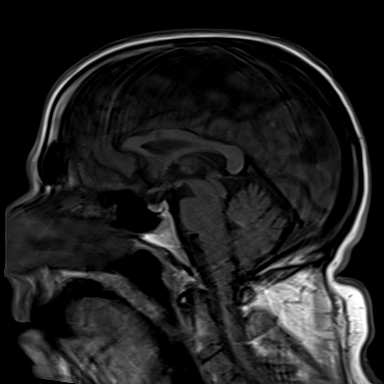
[im 25/25]
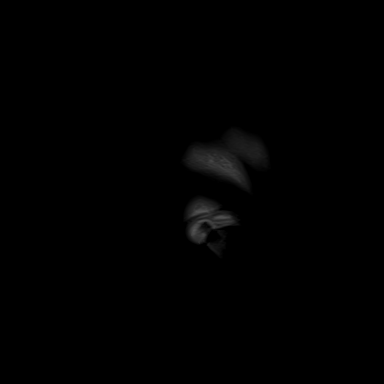

[Series 10: FLAIR · axial · 3.0mm · 0.53mm/px · z∈[-91,+67]mm · 6 of 55 slices shown (1 of 2)]
[im 1/55]
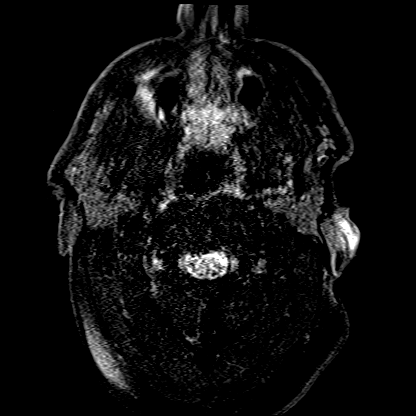
[im 11/55]
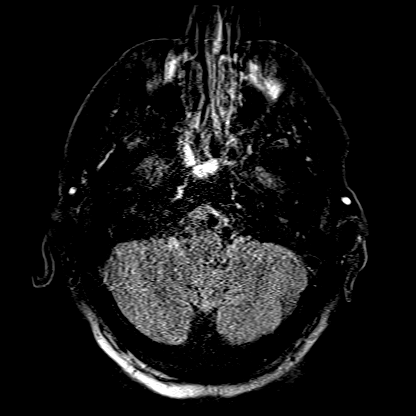
[im 22/55]
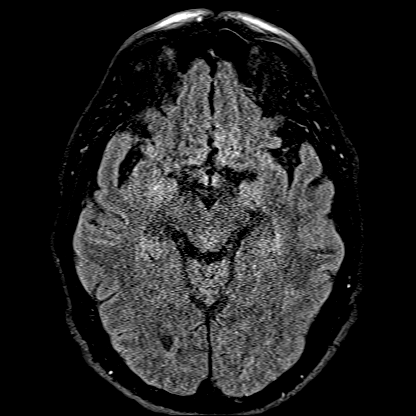
[im 33/55]
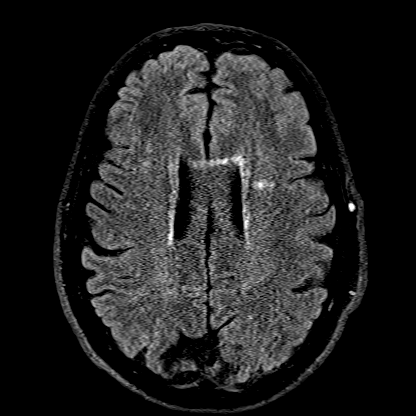
[im 44/55]
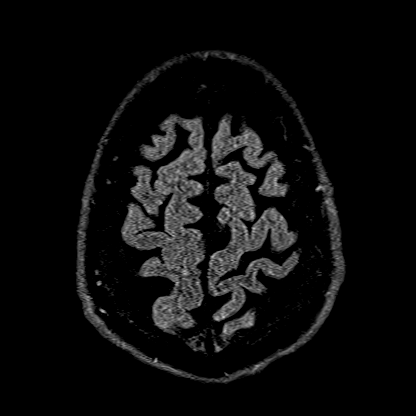
[im 55/55]
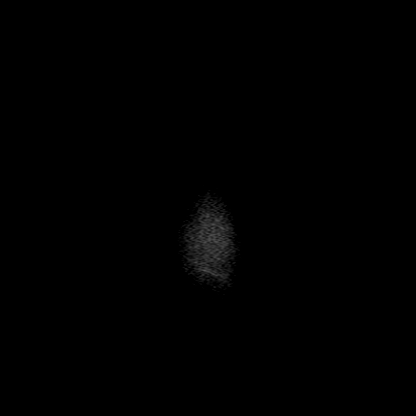

[Series 11: T2 · axial · 5.0mm · 0.55mm/px · z∈[-89,+64]mm · 3 of 27 slices shown (1 of 2)]
[im 1/27]
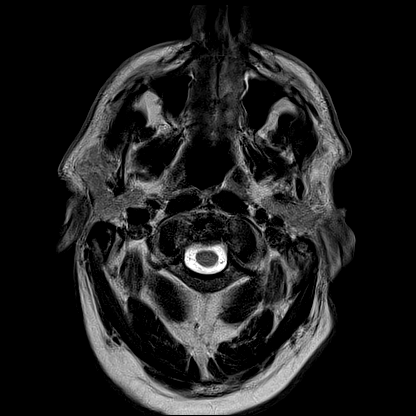
[im 14/27]
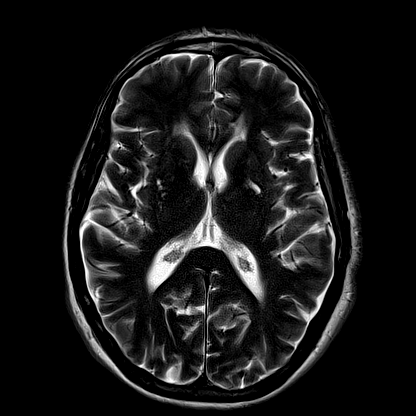
[im 27/27]
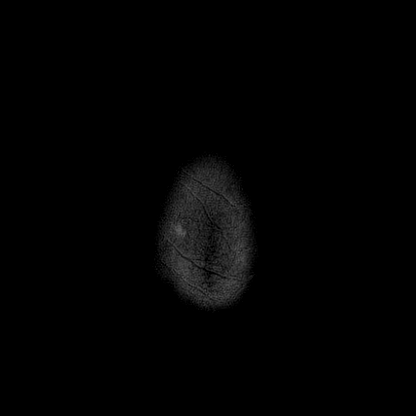

[Series 13: FLAIR · axial · 5.0mm · 1.20mm/px · z∈[-89,+64]mm · 3 of 27 slices shown (2 of 2)]
[im 1/27]
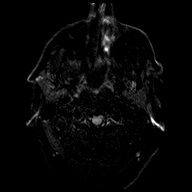
[im 14/27]
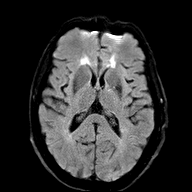
[im 27/27]
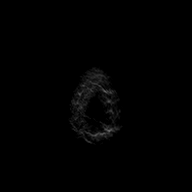

[Series 14: T1 · axial · 5.0mm · 0.90mm/px · z∈[-86,+67]mm · 3 of 27 slices shown (2 of 4)]
[im 1/27]
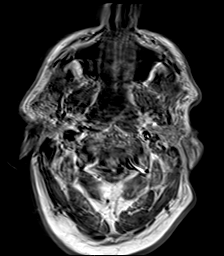
[im 14/27]
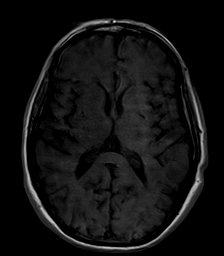
[im 27/27]
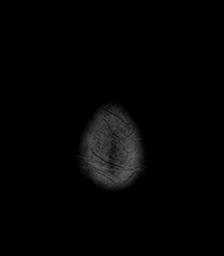

[Series 15: T1 · sagittal · 5.0mm · 0.94mm/px · 2 of 23 slices shown (3 of 4)]
[im 1/23]
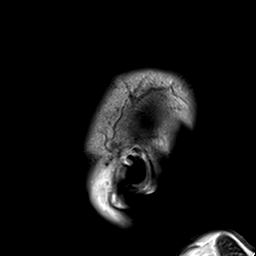
[im 23/23]
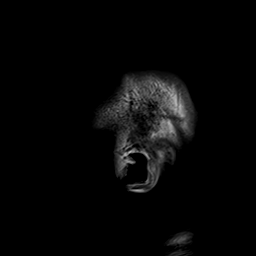

[Series 16: T2 · coronal · 5.0mm · 0.45mm/px · 4 of 35 slices shown (2 of 2)]
[im 1/35]
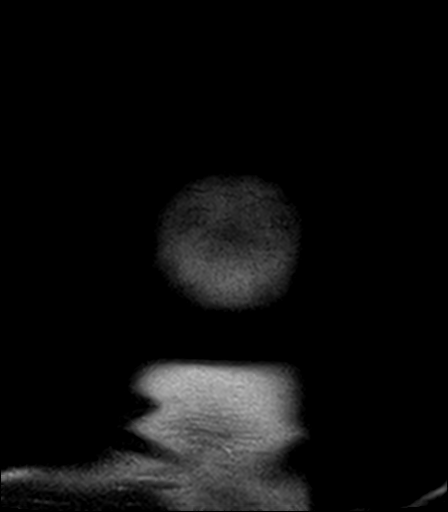
[im 12/35]
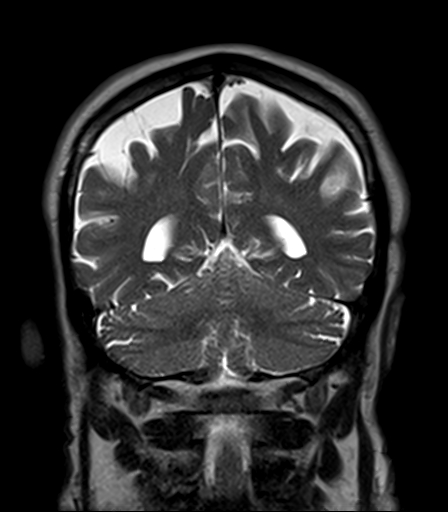
[im 23/35]
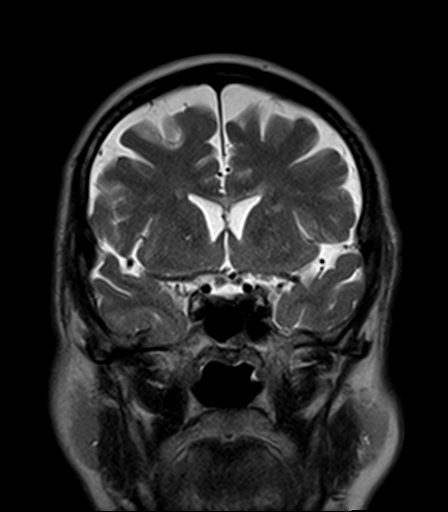
[im 35/35]
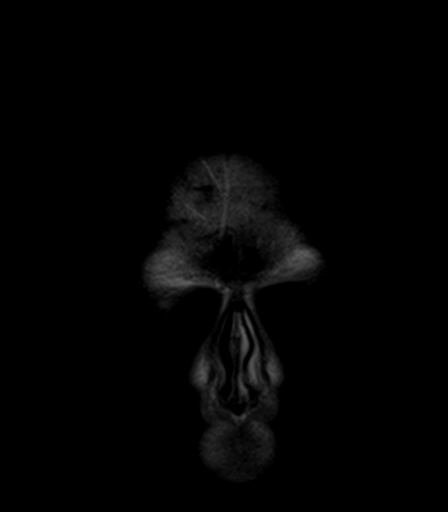

[Series 17: T1 · sagittal · 5.0mm · 0.94mm/px · 2 of 23 slices shown (4 of 4)]
[im 1/23]
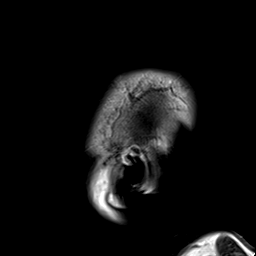
[im 23/23]
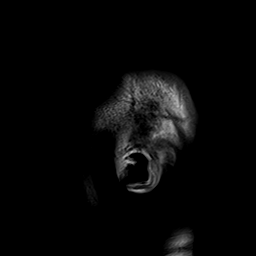

[36 of 48 positions shown; findings below may reference images not displayed]

FINDINGS: Brain:

The examination is intermittently motion degraded. Most notably,
there is moderate motion degradation of the sagittal T1 weighted
sequence, mild-to-moderate motion degradation of the axial T2/FLAIR
sequence and moderate to moderately severe motion degradation of the
axial T1 weighted sequence.

Mild cerebral atrophy.

Apparent punctate focus of diffusion weighted hyperintensity within
the paramedian right pons (series 5, image 19). This is only well
appreciated on the axial diffusion weighted imaging (not well
appreciated on the coronal diffusion-weighted imaging).

Chronic lacunar infarcts within the bilateral corona radiata/basal
ganglia. A small chronic lacunar infarct is also present within the
callosum body (series 10, image 33) (series 9, image 13).

Background mild multifocal T2/FLAIR hyperintensity within the
cerebral white matter, nonspecific but compatible with chronic small
vessel ischemic disease.

Tiny chronic lacunar infarcts within the bilateral cerebellar
hemispheres.

No evidence of intracranial mass.

No chronic intracranial blood products.

No extra-axial fluid collection.

No midline shift.

Vascular: Expected proximal arterial flow voids.

Skull and upper cervical spine: Within the limitations of motion
degradation, no focal marrow lesion is identified.

Sinuses/Orbits: Visualized orbits show no acute finding. Paranasal
sinus mucosal thickening. Most notably, there is mild mucosal
thickening within the bilateral ethmoid and sphenoid sinuses.
IMPRESSION: Intermittently motion degraded examination, as described and
limiting evaluation.

Punctate focus of apparent restricted diffusion within the
paramedian right midbrain, appreciated on the axial
diffusion-weighted imaging only. This may reflect a punctate acute
infarct or image noise artifact.

Chronic lacunar infarcts within the bilateral corona radiata/basal
ganglia, callosal body and bilateral cerebellar hemispheres.

Background mild cerebral atrophy and chronic small vessel ischemic
disease.

Mild paranasal sinus disease, as described.

## 2020-10-19 MED ORDER — LORAZEPAM 2 MG/ML IJ SOLN
1.0000 mg | Freq: Once | INTRAMUSCULAR | Status: AC
  Administered 2020-10-19: 1 mg via INTRAVENOUS
  Filled 2020-10-19: qty 1

## 2020-10-19 NOTE — Discharge Instructions (Addendum)
As we discussed your work-up today shows the possibility of a very small stroke.  You have elected to go home.  As we discussed please return immediately to the emergency department you have any weakness or numbness of any arm or leg confusion slurred speech difficulty thinking or talking.  Please call the number provided for neurology to arrange a follow-up appointment as soon as possible.  Please follow-up with your doctor within the next several days for recheck/reevaluation as well.

## 2020-10-19 NOTE — ED Provider Notes (Signed)
[facility] Emergency Department Provider Note  Time seen: 5:42 PM  I have reviewed the triage vital signs and the nursing notes.   HISTORY  Chief Complaint Dizziness   HPI Brian Porter is a 55 y.o. male with a past medical history of CAD, CHF, gastric reflux, hypertension, MI, presents to the emergency department for continued dizziness.  According to the patient over the past 5 days he has been feeling dizzy which she explains as feeling off balance as well as very fatigued and weak feeling.  He denies any unilateral symptoms however the wife states the other day he was expressing left or right arm weakness but is not sure which one, the patient does not recall this.  Denies any headache.  Denies any fever.  Does state a slight cough.  Patient was seen in the emergency department for the same approximately 3 days ago at that time had a reassuring work-up including lab work chest x-Brian and COVID swab.  I reviewed the patient's chart from that visit.  Past Medical History:  Diagnosis Date  . CAD (coronary artery disease)   . CHF (congestive heart failure) (HCC)   . GERD (gastroesophageal reflux disease)   . Hypertension   . MI, old   . Sleep apnea     Patient Active Problem List   Diagnosis Date Noted  . Prediabetes 10/05/2020  . Leg pain 10/05/2020  . CAP (community acquired pneumonia) 07/24/2018  . Bilateral recurrent inguinal hernia without obstruction or gangrene   . Acute on chronic systolic heart failure (HCC) 08/27/2016  . HTN (hypertension) 11/02/2015  . Tobacco abuse 11/02/2015  . Hand joint pain 11/02/2015  . CHF (congestive heart failure) (HCC) 10/15/2015    Past Surgical History:  Procedure Laterality Date  . CARDIAC CATHETERIZATION Right 10/16/2015   Procedure: Left Heart Cath and Coronary Angiography;  Surgeon: Laurier Nancy, MD;  Location: [facility] INVASIVE CV LAB;  Service: Cardiovascular;  Laterality: Right;  . CORONARY ANGIOPLASTY WITH  STENT PLACEMENT  approx 2 years ago  . HERNIA REPAIR Left 11/29/2016   [facility]  . PARTIAL NEPHRECTOMY Left   . SPLENECTOMY      Prior to Admission medications   Medication Sig Start Date End Date Taking? Authorizing Provider  aspirin 81 MG EC tablet Take 1 tablet (81 mg total) by mouth daily. 08/29/16   Houston Siren, MD  atorvastatin (LIPITOR) 20 MG tablet Take 1 tablet (20 mg total) by mouth daily. 09/27/20   Tukov-Yual, Alroy Bailiff, NP  azithromycin (ZITHROMAX Z-PAK) 250 MG tablet Take 2 tablets (500 mg) on  Day 1,  followed by 1 tablet (250 mg) once daily on Days 2 through 5. 10/16/20 10/21/20  Bradler, Clent Jacks, MD  furosemide (LASIX) 40 MG tablet Take 1 tablet (40 mg total) by mouth daily. 09/12/20   Clarisa Kindred A, FNP  Lidocaine (HM LIDOCAINE PATCH) 4 % PTCH Apply 1 patch topically every 12 (twelve) hours. 10/05/20   Iloabachie, Chioma E, NP  losartan (COZAAR) 50 MG tablet Take 1 tablet (50 mg total) by mouth daily. 10/08/19 03/22/20  Delma Freeze, FNP  metoprolol succinate (TOPROL-XL) 50 MG 24 hr tablet Take 1 tablet (50 mg total) by mouth daily. Take with or immediately following a meal. 09/02/19 03/22/20  Clarisa Kindred A, FNP  potassium chloride (KLOR-CON) 10 MEQ tablet Take 1 tablet (10 mEq total) by mouth daily. Patient taking differently: Take 10 mEq by mouth daily. Patient reports he is taking half  a pill (5mg ) daily 04/23/19   04/25/19 A, FNP  spironolactone (ALDACTONE) 25 MG tablet Take 1 tablet (25 mg total) by mouth daily. 08/11/20 11/09/20  01/09/21, FNP    Allergies  Allergen Reactions  . Entresto [Sacubitril-Valsartan] Cough    Family History  Problem Relation Age of Onset  . Heart failure Mother   . Hypertension Mother   . Other Mother        covid pneumonia  . Heart attack Father 88  . Diabetes Father   . Diabetes Mellitus II Brother     Social History Social History   Tobacco Use  . Smoking status: Current Every Day Smoker    Packs/day: 0.50    Types:  Cigarettes  . Smokeless tobacco: Never Used  . Tobacco comment: noted that he cut back from 1 pack a day to 1/2 a pack a day  Vaping Use  . Vaping Use: Never used  Substance Use Topics  . Alcohol use: No    Alcohol/week: 0.0 standard drinks  . Drug use: Not Currently    Comment: percocet    Review of Systems Constitutional: Negative for fever.  Positive for dizziness/off-balance sensation ENT: Negative for recent illness/congestion Cardiovascular: Negative for chest pain. Respiratory: Negative for shortness of breath.  Slight cough. Gastrointestinal: Negative for abdominal pain, vomiting Musculoskeletal: Negative for musculoskeletal complaints Neurological: Negative for headache All other ROS negative  ____________________________________________   PHYSICAL EXAM:  VITAL SIGNS: ED Triage Vitals  Enc Vitals Group     BP 10/19/20 1646 114/74     Pulse Rate 10/19/20 1646 91     Resp 10/19/20 1646 18     Temp 10/19/20 1646 98.7 F (37.1 C)     Temp Source 10/19/20 1646 Oral     SpO2 10/19/20 1646 96 %     Weight 10/19/20 1647 218 lb (98.9 kg)     Height 10/19/20 1647 5\' 9"  (1.753 m)     Head Circumference --      Peak Flow --      Pain Score 10/19/20 1647 5     Pain Loc --      Pain Edu? --      Excl. in GC? --    Constitutional: Alert and oriented. Well appearing and in no distress. Eyes: Normal exam ENT      Head: Normocephalic and atraumatic.      Mouth/Throat: Mucous membranes are moist. Cardiovascular: Normal rate, regular rhythm. Respiratory: Normal respiratory effort without tachypnea nor retractions. Breath sounds are clear Gastrointestinal: Soft and nontender. No distention.   Musculoskeletal: Nontender with normal range of motion in all extremities. Neurologic:  Normal speech and language.  Equal grip strength bilaterally.  No pronator drift.  4+/5 strength in bilateral lower extremities 5/5 in upper extremities but equal.  No cranial nerve deficits. Skin:   Skin is warm, dry and intact.  Psychiatric: Mood and affect are normal.   ____________________________________________    RADIOLOGY  MRI shows motion degraded exam but there is a punctate area that could represent possibly an acute infarct versus image artifact.  ____________________________________________   INITIAL IMPRESSION / ASSESSMENT AND PLAN / ED COURSE  Pertinent labs & imaging results that were available during my care of the patient were reviewed by me and considered in my medical decision making (see chart for details).   Patient presents to the emergency department for generalized weakness feelings of dizziness which he describes as feeling off balance.  Wife states  possible one-sided weakness a couple days ago which has since resolved per patient.  Patient had a work-up performed 3 days ago that was overall reassuring including reassuring lab work, COVID swab and chest x-Brian.  Patient did not have a urinalysis performed.  We will recheck labs today we will obtain a urine as well.  We will check a troponin and EKG.  Given the patient is generalized weakness sensation and feeling off balance we will proceed with an MRI brain to rule out CVA.  Patient agreeable to plan of care.  Patient's lab work is largely within normal limits.  Urinalysis largely within normal limits.  Patient states he is feeling better he is asking for sandwich to eat.  I discussed with the patient his MRI showing a possible small infarct and I recommend admission to the hospital for neurology consultation and further evaluation.  Patient understands but states he does not want to stay in the hospital.  He is not opposed to following up with neurology as an outpatient.  I spoke to the patient given the MRI results I would strongly recommend he follows up with neurology soon as possible as well as his primary care doctor.  I discussed with the patient if he were to develop any weakness or numbness of any arm or leg  confusion or difficulty speaking or thinking he needs to return to the emergency department immediately.   Andras Grunewald [facility] was evaluated in Emergency Department on 10/19/2020 for the symptoms described in the history of present illness. He was evaluated in the context of the global COVID-19 pandemic, which necessitated consideration that the patient might be at risk for infection with the SARS-CoV-2 virus that causes COVID-19. Institutional protocols and algorithms that pertain to the evaluation of patients at risk for COVID-19 are in a state of rapid change based on information released by regulatory bodies including the CDC and federal and state organizations. These policies and algorithms were followed during the patient's care in the ED.  ____________________________________________   FINAL CLINICAL IMPRESSION(S) / ED DIAGNOSES  Dizziness   Minna Antis, MD 10/19/20 2204

## 2020-10-19 NOTE — ED Notes (Signed)
Pt presents with c/o dizziness that started Monday, denies visual changes. States he was seen in the ED Monday.

## 2020-10-19 NOTE — ED Notes (Signed)
Dr Lenard Lance to place orders as needed

## 2020-10-19 NOTE — ED Notes (Signed)
Pt returned from MRI at this time

## 2020-10-19 NOTE — ED Notes (Signed)
Pt given boxed lunch with okay from Dr. Paduchowski 

## 2020-10-19 NOTE — ED Notes (Signed)
Patient undressed from waist up. Continuous cardiac and pulse ox monitoring. Plan of care discussed, pt spoke with MRI tech and reports claustrophobia, states he will need medication for MRI. Will notify Dr Lenard Lance. Bed locked in low position, side rails up x2, call light in reach. High fall precautions in place.

## 2020-10-19 NOTE — ED Triage Notes (Signed)
Pt to ED for dizziness, overall not feeling well since being treated Monday, was given antibiotic for respiratory infection per pt  Denies shob, cp Reports both legs are weak and achy

## 2020-10-21 ENCOUNTER — Observation Stay
Admission: EM | Admit: 2020-10-21 | Discharge: 2020-10-23 | Disposition: A | Discharge status: Home / Self Care | Payer: Self-pay | Attending: Internal Medicine | Admitting: Internal Medicine

## 2020-10-21 ENCOUNTER — Observation Stay: Payer: Self-pay

## 2020-10-21 ENCOUNTER — Other Ambulatory Visit: Payer: Self-pay

## 2020-10-21 DIAGNOSIS — Y900 Blood alcohol level of less than 20 mg/100 ml: Secondary | ICD-10-CM | POA: Insufficient documentation

## 2020-10-21 DIAGNOSIS — F17210 Nicotine dependence, cigarettes, uncomplicated: Secondary | ICD-10-CM | POA: Insufficient documentation

## 2020-10-21 DIAGNOSIS — I10 Essential (primary) hypertension: Secondary | ICD-10-CM | POA: Diagnosis present

## 2020-10-21 DIAGNOSIS — I509 Heart failure, unspecified: Secondary | ICD-10-CM

## 2020-10-21 DIAGNOSIS — Z20822 Contact with and (suspected) exposure to covid-19: Secondary | ICD-10-CM | POA: Insufficient documentation

## 2020-10-21 DIAGNOSIS — R42 Dizziness and giddiness: Principal | ICD-10-CM

## 2020-10-21 DIAGNOSIS — M79605 Pain in left leg: Secondary | ICD-10-CM

## 2020-10-21 DIAGNOSIS — I5023 Acute on chronic systolic (congestive) heart failure: Secondary | ICD-10-CM | POA: Insufficient documentation

## 2020-10-21 DIAGNOSIS — I2510 Atherosclerotic heart disease of native coronary artery without angina pectoris: Secondary | ICD-10-CM | POA: Insufficient documentation

## 2020-10-21 DIAGNOSIS — I639 Cerebral infarction, unspecified: Secondary | ICD-10-CM

## 2020-10-21 DIAGNOSIS — I110 Hypertensive heart disease with heart failure: Secondary | ICD-10-CM | POA: Insufficient documentation

## 2020-10-21 DIAGNOSIS — Z79899 Other long term (current) drug therapy: Secondary | ICD-10-CM | POA: Insufficient documentation

## 2020-10-21 DIAGNOSIS — Z720 Tobacco use: Secondary | ICD-10-CM | POA: Diagnosis present

## 2020-10-21 DIAGNOSIS — Z7982 Long term (current) use of aspirin: Secondary | ICD-10-CM | POA: Insufficient documentation

## 2020-10-21 DIAGNOSIS — I5022 Chronic systolic (congestive) heart failure: Secondary | ICD-10-CM

## 2020-10-21 DIAGNOSIS — Z8673 Personal history of transient ischemic attack (TIA), and cerebral infarction without residual deficits: Secondary | ICD-10-CM | POA: Diagnosis present

## 2020-10-21 HISTORY — DX: Cerebral infarction, unspecified: I63.9

## 2020-10-21 LAB — COMPREHENSIVE METABOLIC PANEL
ALT: 18 U/L (ref 0–44)
AST: 15 U/L (ref 15–41)
Albumin: 3.7 g/dL (ref 3.5–5.0)
Alkaline Phosphatase: 50 U/L (ref 38–126)
Anion gap: 8 (ref 5–15)
BUN: 16 mg/dL (ref 6–20)
CO2: 25 mmol/L (ref 22–32)
Calcium: 9 mg/dL (ref 8.9–10.3)
Chloride: 106 mmol/L (ref 98–111)
Creatinine, Ser: 0.63 mg/dL (ref 0.61–1.24)
GFR, Estimated: >60 mL/min (ref >60)
Glucose, Bld: 91 mg/dL (ref 70–99)
Potassium: 4.2 mmol/L (ref 3.5–5.1)
Sodium: 139 mmol/L (ref 135–145)
Total Bilirubin: 0.7 mg/dL (ref 0.3–1.2)
Total Protein: 7 g/dL (ref 6.5–8.1)

## 2020-10-21 LAB — URINALYSIS, COMPLETE (UACMP) WITH MICROSCOPIC
Bacteria, UA: NONE SEEN
Bilirubin Urine: NEGATIVE
Glucose, UA: NEGATIVE mg/dL
Hgb urine dipstick: NEGATIVE
Ketones, ur: NEGATIVE mg/dL
Leukocytes,Ua: NEGATIVE
Nitrite: NEGATIVE
Protein, ur: NEGATIVE mg/dL
Specific Gravity, Urine: 1.013 (ref 1.005–1.030)
pH: 7 (ref 5.0–8.0)

## 2020-10-21 LAB — CBC WITH DIFFERENTIAL/PLATELET
Abs Immature Granulocytes: 0.04 10*3/uL (ref 0.00–0.07)
Basophils Absolute: 0.1 10*3/uL (ref 0.0–0.1)
Basophils Relative: 1 %
Eosinophils Absolute: 0.6 10*3/uL — ABNORMAL HIGH (ref 0.0–0.5)
Eosinophils Relative: 5 %
HCT: 43.9 % (ref 39.0–52.0)
Hemoglobin: 14.9 g/dL (ref 13.0–17.0)
Immature Granulocytes: 0 %
Lymphocytes Relative: 33 %
Lymphs Abs: 4.3 10*3/uL — ABNORMAL HIGH (ref 0.7–4.0)
MCH: 32 pg (ref 26.0–34.0)
MCHC: 33.9 g/dL (ref 30.0–36.0)
MCV: 94.4 fL (ref 80.0–100.0)
Monocytes Absolute: 2.2 10*3/uL — ABNORMAL HIGH (ref 0.1–1.0)
Monocytes Relative: 17 %
Neutro Abs: 5.9 10*3/uL (ref 1.7–7.7)
Neutrophils Relative %: 44 %
Platelets: 365 10*3/uL (ref 150–400)
RBC: 4.65 MIL/uL (ref 4.22–5.81)
RDW: 13.7 % (ref 11.5–15.5)
Smear Review: NORMAL
WBC: 13.2 10*3/uL — ABNORMAL HIGH (ref 4.0–10.5)
nRBC: 0 % (ref 0.0–0.2)

## 2020-10-21 LAB — RESP PANEL BY RT-PCR (FLU A&B, COVID) ARPGX2
Influenza A by PCR: NEGATIVE
Influenza B by PCR: NEGATIVE
SARS Coronavirus 2 by RT PCR: NEGATIVE

## 2020-10-21 LAB — URINE DRUG SCREEN, QUALITATIVE (ARMC ONLY)
Amphetamines, Ur Screen: NOT DETECTED
Barbiturates, Ur Screen: NOT DETECTED
Benzodiazepine, Ur Scrn: NOT DETECTED
Cannabinoid 50 Ng, Ur ~~LOC~~: NOT DETECTED
Cocaine Metabolite,Ur ~~LOC~~: NOT DETECTED
MDMA (Ecstasy)Ur Screen: NOT DETECTED
Methadone Scn, Ur: NOT DETECTED
Opiate, Ur Screen: NOT DETECTED
Phencyclidine (PCP) Ur S: NOT DETECTED
Tricyclic, Ur Screen: NOT DETECTED

## 2020-10-21 LAB — TROPONIN I (HIGH SENSITIVITY): Troponin I (High Sensitivity): 10 ng/L (ref <18)

## 2020-10-21 LAB — ETHANOL: Alcohol, Ethyl (B): <10 mg/dL (ref <10)

## 2020-10-21 IMAGING — US US CAROTID DUPLEX BILAT
1 series · 13 of 24 positions shown · non-contrast
Comparison: None.

CLINICAL DATA: Stroke. Hypertension, coronary artery disease,
tobacco abuse

EXAM:
BILATERAL CAROTID DUPLEX ULTRASOUND
TECHNIQUE: Gray scale imaging, color Doppler and duplex ultrasound were
performed of bilateral carotid and vertebral arteries in the neck.

[Series 1: us carotid bilateral · 13 of 68 slices shown]
[im 1/68]
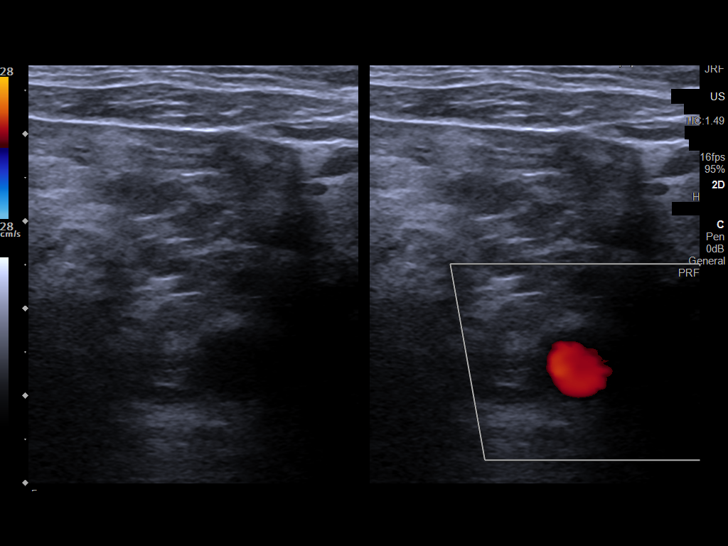
[im 6/68]
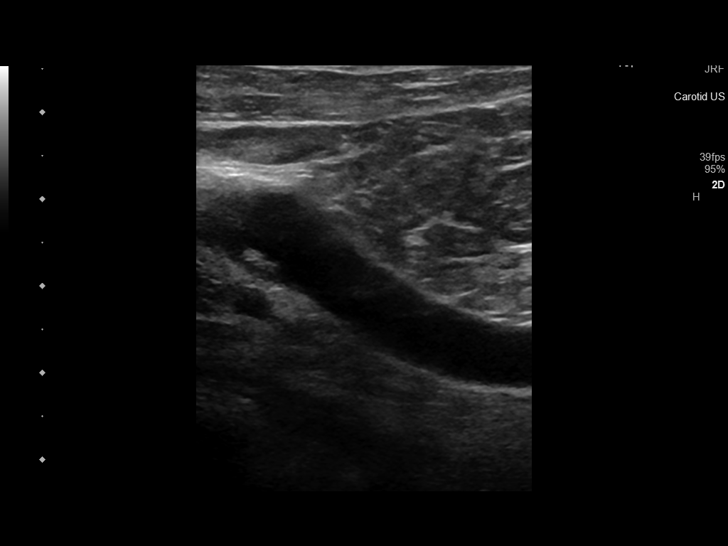
[im 12/68]
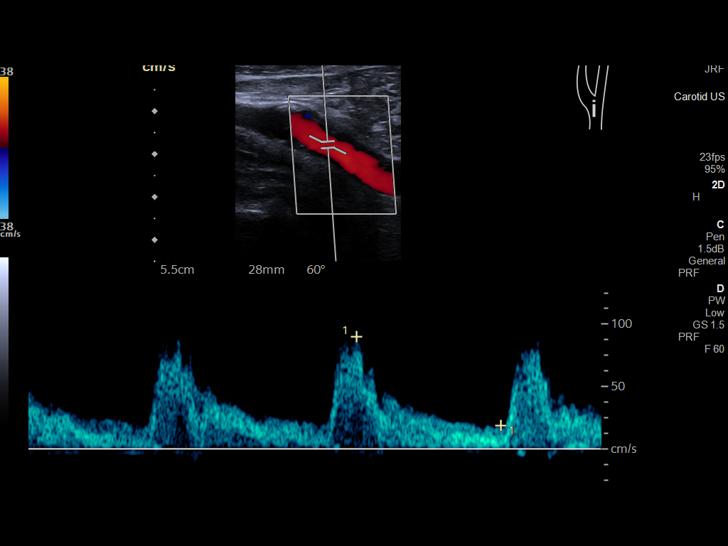
[im 18/68]
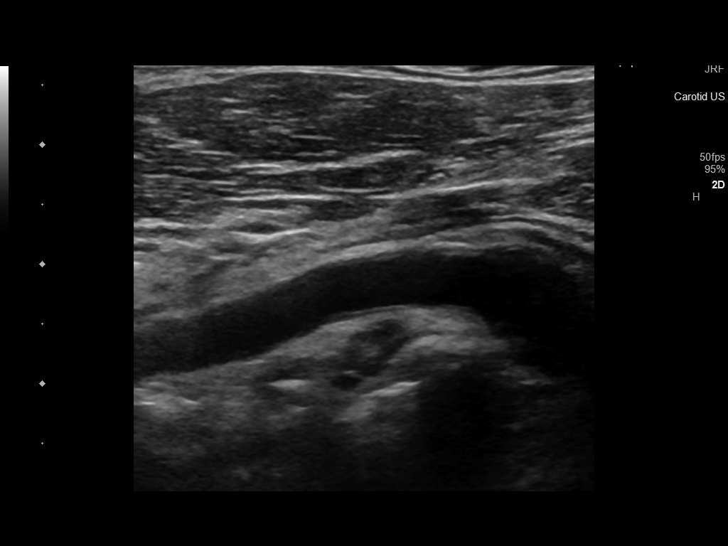
[im 24/68]
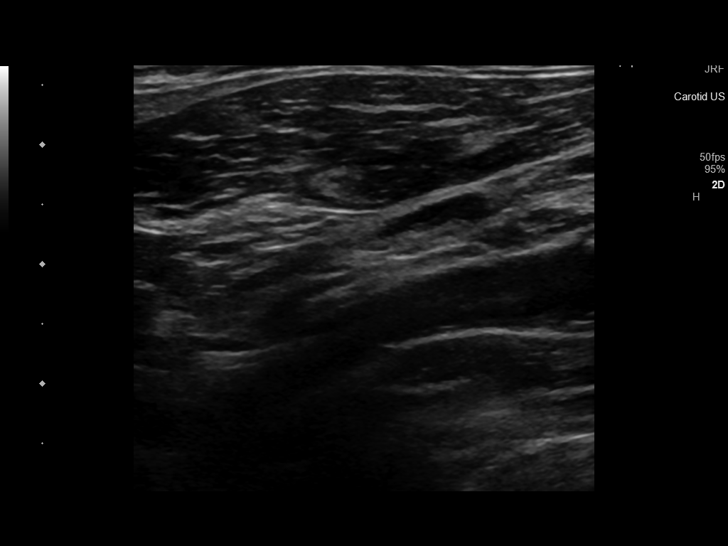
[im 30/68]
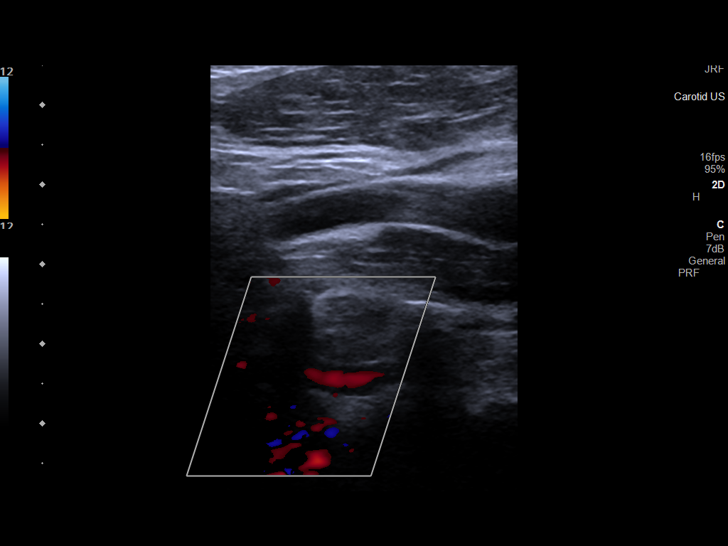
[im 35/68]
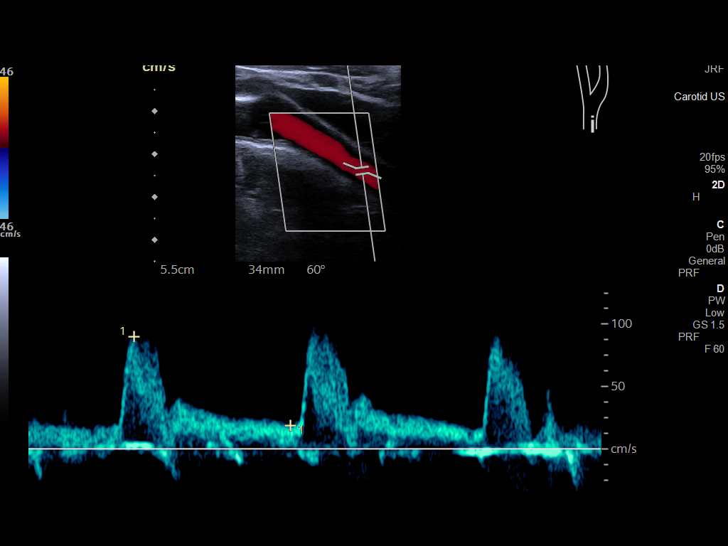
[im 38/68]
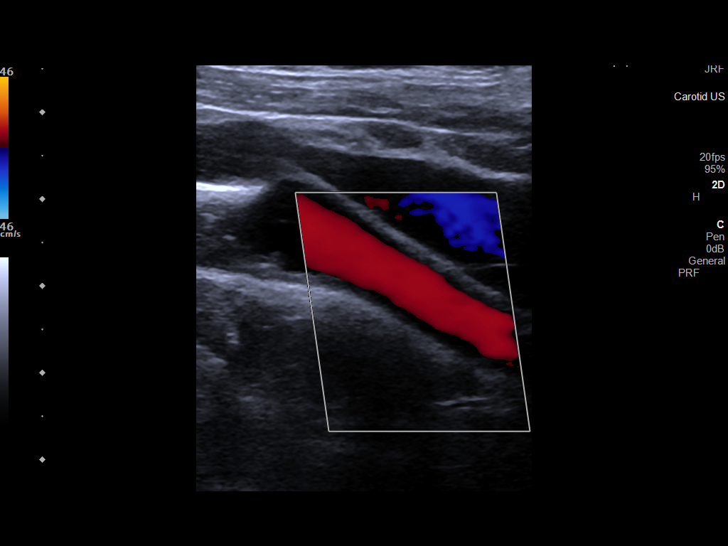
[im 44/68]
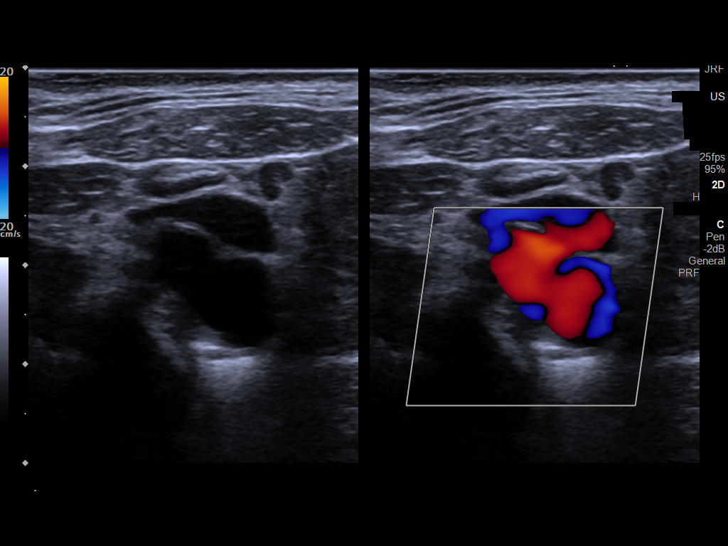
[im 50/68]
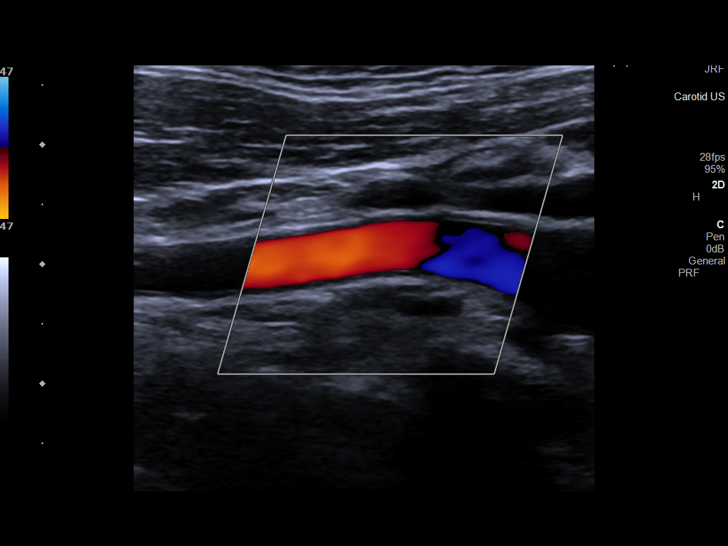
[im 56/68]
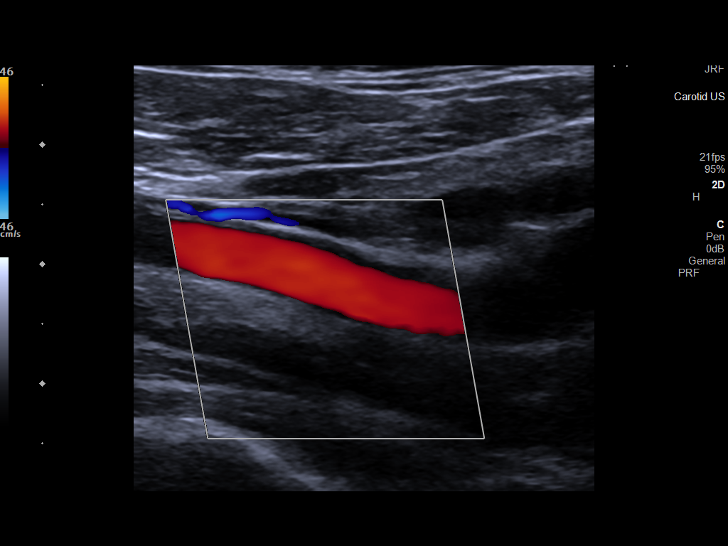
[im 62/68]
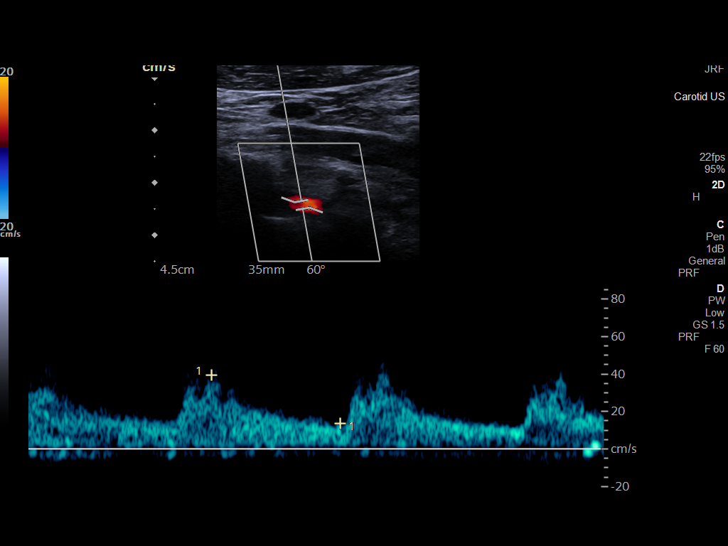
[im 68/68]
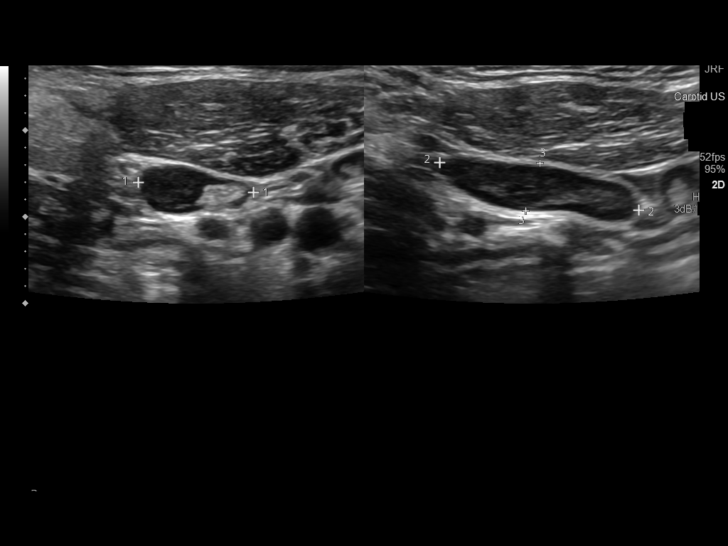

[13 of 24 positions shown; findings below may reference images not displayed]

FINDINGS: Criteria: Quantification of carotid stenosis is based on velocity
parameters that correlate the residual internal carotid diameter
with NASCET-based stenosis levels, using the diameter of the distal
internal carotid lumen as the denominator for stenosis measurement.

The following velocity measurements were obtained:

RIGHT

ICA: 110/32 cm/sec

CCA: 101/21 cm/sec

SYSTOLIC ICA/CCA RATIO:

ECA: 110 cm/sec

LEFT

ICA: 98/35 cm/sec

CCA: 77/21 cm/sec

SYSTOLIC ICA/CCA RATIO:

ECA: 99 cm/sec

RIGHT CAROTID ARTERY: Eccentric calcified plaque in the bulb and ICA
origin with only mild stenosis. Normal waveforms and color Doppler
signal. Mild tortuosity.

RIGHT VERTEBRAL ARTERY:  Normal flow direction and waveform.

LEFT CAROTID ARTERY: Mild eccentric plaque in the bulb. No no
significant ICA stenosis. Normal waveforms and color Doppler signal.
Mild tortuosity.

LEFT VERTEBRAL ARTERY:  Normal flow direction and waveform.

Bilateral morphologically unremarkable cervical lymph nodes are
identified, none greater than 0.7 cm short axis diameter.
IMPRESSION: 1. Bilateral carotid bifurcation plaque resulting in less than 50%
diameter ICA stenosis.
2. Antegrade bilateral vertebral arterial flow.

## 2020-10-21 MED ORDER — ATORVASTATIN CALCIUM 20 MG PO TABS
40.0000 mg | ORAL_TABLET | Freq: Every day | ORAL | Status: DC
  Administered 2020-10-21 – 2020-10-22 (×2): 40 mg via ORAL
  Filled 2020-10-21 (×3): qty 2

## 2020-10-21 MED ORDER — ACETAMINOPHEN 325 MG PO TABS
650.0000 mg | ORAL_TABLET | ORAL | Status: DC | PRN
  Filled 2020-10-21: qty 2

## 2020-10-21 MED ORDER — STROKE: EARLY STAGES OF RECOVERY BOOK: Freq: Once | Status: AC

## 2020-10-21 MED ORDER — ASPIRIN EC 81 MG PO TBEC
81.0000 mg | DELAYED_RELEASE_TABLET | Freq: Every day | ORAL | Status: DC
  Administered 2020-10-21 – 2020-10-23 (×3): 81 mg via ORAL
  Filled 2020-10-21 (×3): qty 1

## 2020-10-21 MED ORDER — ACETAMINOPHEN 500 MG PO TABS
1000.0000 mg | ORAL_TABLET | Freq: Four times a day (QID) | ORAL | Status: DC | PRN
  Administered 2020-10-21 – 2020-10-23 (×4): 1000 mg via ORAL
  Filled 2020-10-21 (×4): qty 2

## 2020-10-21 MED ORDER — ACETAMINOPHEN 650 MG RE SUPP: 650.0000 mg | RECTAL | Status: DC | PRN

## 2020-10-21 MED ORDER — MECLIZINE HCL 25 MG PO TABS
25.0000 mg | ORAL_TABLET | Freq: Once | ORAL | Status: AC
  Administered 2020-10-21: 25 mg via ORAL
  Filled 2020-10-21: qty 1

## 2020-10-21 MED ORDER — ACETAMINOPHEN 500 MG PO TABS: 1000.0000 mg | ORAL_TABLET | ORAL | Status: DC | PRN

## 2020-10-21 MED ORDER — ACETAMINOPHEN 160 MG/5ML PO SOLN: 650.0000 mg | ORAL | Status: DC | PRN

## 2020-10-21 MED ORDER — SENNOSIDES-DOCUSATE SODIUM 8.6-50 MG PO TABS: 1.0000 | ORAL_TABLET | Freq: Every evening | ORAL | Status: DC | PRN

## 2020-10-21 MED ORDER — ENOXAPARIN SODIUM 40 MG/0.4ML ~~LOC~~ SOLN
40.0000 mg | SUBCUTANEOUS | Status: DC
  Administered 2020-10-21 – 2020-10-22 (×2): 40 mg via SUBCUTANEOUS
  Filled 2020-10-21 (×2): qty 0.4

## 2020-10-21 MED ORDER — CARBAMIDE PEROXIDE 6.5 % OT SOLN
5.0000 [drp] | Freq: Two times a day (BID) | OTIC | Status: DC
  Administered 2020-10-21 – 2020-10-23 (×2): 5 [drp] via OTIC
  Filled 2020-10-21 (×2): qty 15

## 2020-10-21 MED ORDER — ACETAMINOPHEN 160 MG/5ML PO SOLN
650.0000 mg | ORAL | Status: DC | PRN
  Filled 2020-10-21: qty 20.3

## 2020-10-21 NOTE — Progress Notes (Signed)
Lab at bedside

## 2020-10-21 NOTE — ED Provider Notes (Signed)
[facility] Emergency Department Provider Note  ____________________________________________   Event Date/Time   First MD Initiated Contact with Patient 10/21/20 1456     (approximate)  I have reviewed the triage vital signs and the nursing notes.   HISTORY  Chief Complaint Dizziness    HPI Brian Porter is a 55 y.o. male  with CAD, CHF, gastric reflux, hypertension, MI who comes into the ED with worsening dizziness.  This is now patient's third presentation to the emergency room. During his ER visit patient's MRI did show concern for a small infarct and the provider recommended admission for neurological consultation further evaluation.  Patient stated that he did not want to stay in the emergency room at that time.  He would prefer to follow-up outpatient with neurology.  However patient now he presents because he states that his symptoms are not getting any better.  He reports dizziness is at rest but also worse with movement, ringing in his ear is what she thinks is bilateral.  Dizziness this is severe, constant, nothing makes better, worse with movement.  Denies ever having this previously.  Patient reports that he was on azithromycin and did complete this course when he was first seen on 4/18 and states that his cough is improved.  He denies any chest pain, shortness of breath, abdominal pain.  Denies aspirin or BC powder use            Past Medical History:  Diagnosis Date  . CAD (coronary artery disease)   . CHF (congestive heart failure) (HCC)   . GERD (gastroesophageal reflux disease)   . Hypertension   . MI, old   . Sleep apnea     Patient Active Problem List   Diagnosis Date Noted  . Prediabetes 10/05/2020  . Leg pain 10/05/2020  . CAP (community acquired pneumonia) 07/24/2018  . Bilateral recurrent inguinal hernia without obstruction or gangrene   . Acute on chronic systolic heart failure (HCC) 08/27/2016  . HTN (hypertension)  11/02/2015  . Tobacco abuse 11/02/2015  . Hand joint pain 11/02/2015  . CHF (congestive heart failure) (HCC) 10/15/2015    Past Surgical History:  Procedure Laterality Date  . CARDIAC CATHETERIZATION Right 10/16/2015   Procedure: Left Heart Cath and Coronary Angiography;  Surgeon: Laurier Nancy, MD;  Location: [facility] INVASIVE CV LAB;  Service: Cardiovascular;  Laterality: Right;  . CORONARY ANGIOPLASTY WITH STENT PLACEMENT  approx 2 years ago  . HERNIA REPAIR Left 11/29/2016   [facility]  . PARTIAL NEPHRECTOMY Left   . SPLENECTOMY      Prior to Admission medications   Medication Sig Start Date End Date Taking? Authorizing Provider  aspirin 81 MG EC tablet Take 1 tablet (81 mg total) by mouth daily. 08/29/16   Houston Siren, MD  atorvastatin (LIPITOR) 20 MG tablet Take 1 tablet (20 mg total) by mouth daily. 09/27/20   Tukov-Yual, Alroy Bailiff, NP  azithromycin (ZITHROMAX Z-PAK) 250 MG tablet Take 2 tablets (500 mg) on  Day 1,  followed by 1 tablet (250 mg) once daily on Days 2 through 5. 10/16/20 10/21/20  Bradler, Clent Jacks, MD  furosemide (LASIX) 40 MG tablet Take 1 tablet (40 mg total) by mouth daily. 09/12/20   Clarisa Kindred A, FNP  Lidocaine (HM LIDOCAINE PATCH) 4 % PTCH Apply 1 patch topically every 12 (twelve) hours. 10/05/20   Iloabachie, Chioma E, NP  losartan (COZAAR) 50 MG tablet Take 1 tablet (50 mg total) by mouth daily.  10/08/19 03/22/20  Delma Freeze, FNP  metoprolol succinate (TOPROL-XL) 50 MG 24 hr tablet Take 1 tablet (50 mg total) by mouth daily. Take with or immediately following a meal. 09/02/19 03/22/20  Clarisa Kindred A, FNP  potassium chloride (KLOR-CON) 10 MEQ tablet Take 1 tablet (10 mEq total) by mouth daily. Patient taking differently: Take 10 mEq by mouth daily. Patient reports he is taking half a pill (5mg ) daily 04/23/19   04/25/19 A, FNP  spironolactone (ALDACTONE) 25 MG tablet Take 1 tablet (25 mg total) by mouth daily. 08/11/20 11/09/20  01/09/21, FNP     Allergies Delma Freeze [sacubitril-valsartan]  Family History  Problem Relation Age of Onset  . Heart failure Mother   . Hypertension Mother   . Other Mother        covid pneumonia  . Heart attack Father 33  . Diabetes Father   . Diabetes Mellitus II Brother     Social History Social History   Tobacco Use  . Smoking status: Current Every Day Smoker    Packs/day: 0.50    Types: Cigarettes  . Smokeless tobacco: Never Used  . Tobacco comment: noted that he cut back from 1 pack a day to 1/2 a pack a day  Vaping Use  . Vaping Use: Never used  Substance Use Topics  . Alcohol use: No    Alcohol/week: 0.0 standard drinks  . Drug use: Not Currently    Comment: percocet      Review of Systems Constitutional: No fever/chills Eyes: No visual changes. ENT: No sore throat. Cardiovascular: Denies chest pain. Respiratory: Denies shortness of breath. Gastrointestinal: No abdominal pain.  No nausea, no vomiting.  No diarrhea.  No constipation. Genitourinary: Negative for dysuria. Musculoskeletal: Negative for back pain. Skin: Negative for rash. Neurological: Negative for headaches, focal weakness or numbness. Dizzy  All other ROS negative ____________________________________________   PHYSICAL EXAM:  VITAL SIGNS: ED Triage Vitals  Enc Vitals Group     BP 10/21/20 1504 133/75     Pulse Rate 10/21/20 1504 74     Resp 10/21/20 1504 16     Temp 10/21/20 1505 98.5 F (36.9 C)     Temp Source 10/21/20 1505 Oral     SpO2 10/21/20 1504 97 %     Weight 10/21/20 1501 218 lb 4.1 oz (99 kg)     Height 10/21/20 1501 5\' 9"  (1.753 m)     Head Circumference --      Peak Flow --      Pain Score 10/21/20 1501 0     Pain Loc --      Pain Edu? --      Excl. in GC? --     Constitutional: Alert and oriented. Well appearing and in no acute distress. Eyes: Conjunctivae are normal. EOMI. Head: Atraumatic.  TM impaction on the right.  Some moderate wax on the left. Nose: No  congestion/rhinnorhea. Mouth/Throat: Mucous membranes are moist.   Neck: No stridor. Trachea Midline. FROM Cardiovascular: Normal rate, regular rhythm. Grossly normal heart sounds.  Good peripheral circulation. Respiratory: Normal respiratory effort.  No retractions. Lungs CTAB. Gastrointestinal: Soft and nontender. No distention. No abdominal bruits.  Musculoskeletal: No lower extremity tenderness nor edema.  No joint effusions. Neurologic:  Normal speech and language. No gross focal neurologic deficits are appreciated. Finger to nose intact. No other cranial deficits.  Skin:  Skin is warm, dry and intact. No rash noted. Psychiatric: Mood and affect are normal. Speech and  behavior are normal. GU: Deferred   ____________________________________________   LABS (all labs ordered are listed, but only abnormal results are displayed)  Labs Reviewed  CBC WITH DIFFERENTIAL/PLATELET - Abnormal; Notable for the following components:      Result Value   WBC 13.2 (*)    Lymphs Abs 4.3 (*)    Monocytes Absolute 2.2 (*)    Eosinophils Absolute 0.6 (*)    All other components within normal limits  URINALYSIS, COMPLETE (UACMP) WITH MICROSCOPIC - Abnormal; Notable for the following components:   Color, Urine YELLOW (*)    APPearance CLEAR (*)    All other components within normal limits  RESP PANEL BY RT-PCR (FLU A&B, COVID) ARPGX2  COMPREHENSIVE METABOLIC PANEL  ETHANOL  URINE DRUG SCREEN, QUALITATIVE ([facility] ONLY)  TROPONIN I (HIGH SENSITIVITY)   ____________________________________________   ED ECG REPORT I, Concha Se, the attending physician, personally viewed and interpreted this ECG.  Normal sinus rate of 77, no ST elevation, no T wave inversion, left anterior fascicular block ____________________________________________  RADIOLOGY Prior MRI concerning for acute infarct  PROCEDURES  Procedure(s) performed (including Critical Care):  .1-3 Lead EKG Interpretation Performed  by: Concha Se, MD Authorized by: Concha Se, MD     Interpretation: normal     ECG rate:  70s    ECG rate assessment: normal     Rhythm: sinus rhythm     Ectopy: none     Conduction: normal       ____________________________________________   INITIAL IMPRESSION / ASSESSMENT AND PLAN / ED COURSE  Brian Porter was evaluated in Emergency Department on 10/21/2020 for the symptoms described in the history of present illness. He was evaluated in the context of the global COVID-19 pandemic, which necessitated consideration that the patient might be at risk for infection with the SARS-CoV-2 virus that causes COVID-19. Institutional protocols and algorithms that pertain to the evaluation of patients at risk for COVID-19 are in a state of rapid change based on information released by regulatory bodies including the CDC and federal and state organizations. These policies and algorithms were followed during the patient's care in the ED.    Patient is a 55 year old who comes in with dizziness at rest but worse with ambulation.  Patient is now here for his third time for this.  His work-up has been reassuring without evidence of cardiac issues but we will keep patient on the cardiac monitor to evaluate for arrhythmia.  Labs ordered to reevaluate for Electra abnormalities, AKI, UTI.  On physical exam patient has earwax impaction on the right which could be contributing to his symptoms.  We will attempt to debride this and see if that helps improve.  We will get orthostatics to see if patient needs fluid and give some meclizine.  However most importantly patient did have an MRI showed concerning for an acute stroke versus artifact and they recommended admission last time.  Patient comes back because he is willing to be admitted at this time for further work-up of this.  Will discuss with hospital team for admission.  Labs are reassuring.  White count slightly elevated but no signs of infection.  COVID  test is negative.  Orthostatics were negative.  Trialed some meclizine and cleaned out ears.  Will discuss possible team for admission for stroke work-up       ____________________________________________   FINAL CLINICAL IMPRESSION(S) / ED DIAGNOSES   Final diagnoses:  Dizziness      MEDICATIONS GIVEN  DURING THIS VISIT:  Medications  carbamide peroxide (DEBROX) 6.5 % OTIC (EAR) solution 5 drop (5 drops Both EARS Given 10/21/20 1608)  meclizine (ANTIVERT) tablet 25 mg (25 mg Oral Given 10/21/20 1607)     ED Discharge Orders    None       Note:  This document was prepared using Dragon voice recognition software and may include unintentional dictation errors.   Concha SeFunke, Caden Fatica E, MD 10/21/20 [phone]

## 2020-10-21 NOTE — H&P (Addendum)
History and Physical   Brian Porter [facility] GDJ:[id number] DOB: 1965-09-03 DOA: 10/21/2020  PCP: Pcp, No  Outpatient Specialists:  Patient coming from: Home  I have personally briefly reviewed patient's old medical records in [facility] EMR.  Chief Concern: Dizziness  HPI: Brian Porter is a 55 y.o. male with medical history significant for tobacco abuse,GERD, hyperlipidemia, hypertension, presents to the emergency department for chief concerns of dizziness.  He describes the feeling as light headed, like the room was spining.  He states that this happens when he was out in his yard.  The initial episode was last Tuesday, 10/17/20, he was walking in [place], he felt right sided tingling and numbnesss.  He endorses feeling imbalance.  He states that prior to this weakness is never happened before.   Social history: He lives at home by himself, he smokes half a pack per day, denies alcohol use, recreational drug use.  ROS: Constitutional: no weight change, no fever ENT/Mouth: no sore throat, no rhinorrhea Eyes: no eye pain, no vision changes Cardiovascular: no chest pain, no dyspnea,  no edema, no palpitations Respiratory: no cough, no sputum, no wheezing Gastrointestinal: no nausea, no vomiting, no diarrhea, no constipation Genitourinary: no urinary incontinence, no dysuria, no hematuria Musculoskeletal: no arthralgias, no myalgias Skin: no skin lesions, no pruritus, Neuro: + weakness, no loss of consciousness, no syncope, + dizziness Psych: no anxiety, no depression, + decreased appetite Heme/Lymph: no bruising, no bleeding  ED Course: Discussed with emergency medicine provider, patient requiring hospitalization for a stroke work-up.  Vitals in the emergency department was remarkable for temperature of 98.5, respiration rate of 17, heart rate 68, blood pressure 119/78, SPO2 of 98% on room air.  Labs in the emergency department was remarkable for WBC of 13.2, hemoglobin of 14.9, platelets  of 365, sodium 139, potassium 4.2, chloride 109, bicarb 25, BUN 16, serum creatinine 0.63, nonfasting blood glucose of 91.  EDP noted bilateral increase ears cerumen and ordered for ear flush bilaterally.  Assessment/Plan  Active Problems:   CHF (congestive heart failure) (HCC)   HTN (hypertension)   Tobacco abuse   CVA (cerebral vascular accident) (HCC)   Dizziness Stroke-like symptoms - MRI of the brain without contrast on 10/19/2020: Read as punctate focus of apparent restricted diffusion within the paramedian right midbrain, appreciated on the axial diffusion-weighted image only.  Chronic lacunar infarcts within the bilateral corona radiata/basal ganglia, colossal body and bilateral cerebellar hemispheres.  Background mild cerebral atrophy and chronic small vessel ischemic disease.  Mild paranasal sinus disease. - AM team to consult neurology  -Bilateral carotid ultrasound ordered - Complete echo with bubble study ordered - Fasting lipid and A1c ordered - Patient is outside the window for permissive hypertension - Frequent neuro vascular checks - N.p.o. pending swallow study - PT, OT, SLP - Fall precaution and aspiration precaution  Dizziness- may be acute on chronic stroke versus bilateral impacted cerumen of the tympanic membrane - Status post ear flushing  Hypertension-losartan 50 mg daily, metoprolol 50 mg daily, spironolactone 25 mg daily  Hyperlipidemia-atorvastatin 40 mg nightly  Heart failure reduced ejection fraction-resumed home medications, currently euvolemic and compensated  Chart reviewed.   Previous echo done in 05/24/2019 showed ejection fraction of 30 to 35%, left ventricle has moderate to severely decreased function.  Left ventricular diastolic parameters consistent with grade 1 diastolic dysfunction, impaired relaxation.  RV systolic function has normal systolic function.  DVT prophylaxis: Enoxaparin 40 mg subcutaneous every 24 hours Code Status: Full  code  Diet: Heart healthy Family Communication: No Disposition Plan: Pending clinical course Consults called: None at this time Admission status: Observation, MedSurg, telemetry  Past Medical History:  Diagnosis Date  . CAD (coronary artery disease)   . CHF (congestive heart failure) (HCC)   . GERD (gastroesophageal reflux disease)   . Hypertension   . MI, old   . Sleep apnea    Past Surgical History:  Procedure Laterality Date  . CARDIAC CATHETERIZATION Right 10/16/2015   Procedure: Left Heart Cath and Coronary Angiography;  Surgeon: Laurier Nancy, MD;  Location: [facility] INVASIVE CV LAB;  Service: Cardiovascular;  Laterality: Right;  . CORONARY ANGIOPLASTY WITH STENT PLACEMENT  approx 2 years ago  . HERNIA REPAIR Left 11/29/2016   [facility]  . PARTIAL NEPHRECTOMY Left   . SPLENECTOMY     Social History:  reports that he has been smoking cigarettes. He has been smoking about 0.50 packs per day. He has never used smokeless tobacco. He reports previous drug use. He reports that he does not drink alcohol.  Allergies  Allergen Reactions  . Entresto [Sacubitril-Valsartan] Cough   Family History  Problem Relation Age of Onset  . Heart failure Mother   . Hypertension Mother   . Other Mother        covid pneumonia  . Heart attack Father 6  . Diabetes Father   . Diabetes Mellitus II Brother    Family history: Family history reviewed and not pertinent  Prior to Admission medications   Medication Sig Start Date End Date Taking? Authorizing Provider  aspirin 81 MG EC tablet Take 1 tablet (81 mg total) by mouth daily. 08/29/16   Houston Siren, MD  atorvastatin (LIPITOR) 20 MG tablet Take 1 tablet (20 mg total) by mouth daily. 09/27/20   Tukov-Yual, Alroy Bailiff, NP  azithromycin (ZITHROMAX Z-PAK) 250 MG tablet Take 2 tablets (500 mg) on  Day 1,  followed by 1 tablet (250 mg) once daily on Days 2 through 5. 10/16/20 10/21/20  Bradler, Clent Jacks, MD  furosemide (LASIX) 40 MG tablet Take 1 tablet  (40 mg total) by mouth daily. 09/12/20   Clarisa Kindred A, FNP  Lidocaine (HM LIDOCAINE PATCH) 4 % PTCH Apply 1 patch topically every 12 (twelve) hours. 10/05/20   Iloabachie, Chioma E, NP  losartan (COZAAR) 50 MG tablet Take 1 tablet (50 mg total) by mouth daily. 10/08/19 03/22/20  Delma Freeze, FNP  metoprolol succinate (TOPROL-XL) 50 MG 24 hr tablet Take 1 tablet (50 mg total) by mouth daily. Take with or immediately following a meal. 09/02/19 03/22/20  Clarisa Kindred A, FNP  potassium chloride (KLOR-CON) 10 MEQ tablet Take 1 tablet (10 mEq total) by mouth daily. Patient taking differently: Take 10 mEq by mouth daily. Patient reports he is taking half a pill (5mg ) daily 04/23/19   04/25/19 A, FNP  spironolactone (ALDACTONE) 25 MG tablet Take 1 tablet (25 mg total) by mouth daily. 08/11/20 11/09/20  01/09/21, FNP   Physical Exam: Vitals:   10/21/20 1700 10/21/20 1843 10/21/20 2038 10/21/20 2246  BP: 132/84 119/75 122/74 124/76  Pulse: 70 63 70 68  Resp: 18 16 18 18   Temp:  99.3 F (37.4 C) 98.1 F (36.7 C) 97.8 F (36.6 C)  TempSrc:  Oral  Oral  SpO2: 99% 97% 96% 97%  Weight:      Height:       Constitutional: appears age-appropriate, NAD, calm, comfortable Eyes: PERRL, lids and conjunctivae normal ENMT: Mucous  membranes are moist. Posterior pharynx clear of any exudate or lesions. Age-appropriate dentition. Hearing appropriate Neck: normal, supple, no masses, no thyromegaly Respiratory: clear to auscultation bilaterally, no wheezing, no crackles. Normal respiratory effort. No accessory muscle use.  Cardiovascular: Regular rate and rhythm, no murmurs / rubs / gallops. No extremity edema. 2+ pedal pulses. No carotid bruits.  Abdomen: no tenderness, no masses palpated, no hepatosplenomegaly. Bowel sounds positive.  Musculoskeletal: no clubbing / cyanosis. No joint deformity upper and lower extremities. Good ROM, no contractures, no atrophy. Normal muscle tone.  Skin: no rashes,  lesions, ulcers. No induration Neurologic: Sensation intact. Strength 5/5 in all 4.  Psychiatric: Normal judgment and insight. Alert and oriented x 3. Normal mood.   EKG: independently reviewed, showing sinus rhythm with a rate of 77, QTc 447  Chest x-ray on Admission: I personally reviewed and I agree with radiologist reading as below.  US Carotid Bilateral (at [facility] and [facility] only)  Result Date: 10/21/2020 CLINICAL DATA:  Stroke. Hypertension, coronary artery disease, tobacco abuse EXAM: BILATERAL CAROTID DUPLEX ULTRASOUND TECHNIQUE: Wallace CullensGray scale imaging, color Doppler and duplex ultrasound were performed of bilateral carotid and vertebral arteries in the neck. COMPARISON:  None. FINDINGS: Criteria: Quantification of carotid stenosis is based on velocity parameters that correlate the residual internal carotid diameter with NASCET-based stenosis levels, using the diameter of the distal internal carotid lumen as the denominator for stenosis measurement. The following velocity measurements were obtained: RIGHT ICA: 110/32 cm/sec CCA: 101/21 cm/sec SYSTOLIC ICA/CCA RATIO:  1.1 ECA: 110 cm/sec LEFT ICA: 98/35 cm/sec CCA: 77/21 cm/sec SYSTOLIC ICA/CCA RATIO:  1.3 ECA: 99 cm/sec RIGHT CAROTID ARTERY: Eccentric calcified plaque in the bulb and ICA origin with only mild stenosis. Normal waveforms and color Doppler signal. Mild tortuosity. RIGHT VERTEBRAL ARTERY:  Normal flow direction and waveform. LEFT CAROTID ARTERY: Mild eccentric plaque in the bulb. No no significant ICA stenosis. Normal waveforms and color Doppler signal. Mild tortuosity. LEFT VERTEBRAL ARTERY:  Normal flow direction and waveform. Bilateral morphologically unremarkable cervical lymph nodes are identified, none greater than 0.7 cm short axis diameter. IMPRESSION: 1. Bilateral carotid bifurcation plaque resulting in less than 50% diameter ICA stenosis. 2. Antegrade bilateral vertebral arterial flow. Electronically Signed   By: Corlis Leak  Hassell M.D.   On:  10/21/2020 18:55   Labs on Admission: I have personally reviewed following labs  CBC: Recent Labs  Lab 10/16/20 1518 10/19/20 1807 10/21/20 1533  WBC 12.5* 14.2* 13.2*  NEUTROABS  --   --  5.9  HGB 15.6 15.6 14.9  HCT 45.2 44.8 43.9  MCV 94.2 92.8 94.4  PLT 365 372 365   Basic Metabolic Panel: Recent Labs  Lab 10/16/20 1518 10/19/20 1807 10/21/20 1533  NA 138 138 139  K 3.9 3.8 4.2  CL 105 106 106  CO2 25 25 25   GLUCOSE 106* 86 91  BUN 15 18 16   CREATININE 0.92 0.70 0.63  CALCIUM 8.8* 9.0 9.0   GFR: Estimated Creatinine Clearance: 121 mL/min (by C-G formula based on SCr of 0.63 mg/dL).  Liver Function Tests: Recent Labs  Lab 10/19/20 1807 10/21/20 1533  AST 17 15  ALT 19 18  ALKPHOS 52 50  BILITOT 0.7 0.7  PROT 7.1 7.0  ALBUMIN 3.8 3.7   Urine analysis:    Component Value Date/Time   COLORURINE YELLOW (A) 10/21/2020 1533   APPEARANCEUR CLEAR (A) 10/21/2020 1533   LABSPEC 1.013 10/21/2020 1533   PHURINE 7.0 10/21/2020 1533   GLUCOSEU NEGATIVE 10/21/2020 1533  HGBUR NEGATIVE 10/21/2020 1533   BILIRUBINUR NEGATIVE 10/21/2020 1533   KETONESUR NEGATIVE 10/21/2020 1533   PROTEINUR NEGATIVE 10/21/2020 1533   UROBILINOGEN 1.0 12/10/2014 2035   NITRITE NEGATIVE 10/21/2020 1533   LEUKOCYTESUR NEGATIVE 10/21/2020 1533   Lautaro Koral N Soren Pigman D.O. [facility]  If 7PM-7AM, please contact overnight-coverage provider If 7AM-7PM, please contact day coverage provider [email or web address]  10/22/2020, 1:19 AM

## 2020-10-21 NOTE — ED Triage Notes (Signed)
Pt presents to ED for dizziness x1week, was seen for same 2 days ago, states MRI was completed but did not show reason for symptoms. Denies syncope/near syncope. Also endorses ringing in ears. Pt alert, oriented, and ambulatory with no distress at time of triage.

## 2020-10-21 NOTE — Progress Notes (Signed)
Pt arrived to 1C bed 120 at this time for continued medical treatment, alert and oriented, ambulatory, room air.  Pt oriented to room and 1C

## 2020-10-22 ENCOUNTER — Observation Stay: Payer: Self-pay

## 2020-10-22 ENCOUNTER — Observation Stay (HOSPITAL_BASED_OUTPATIENT_CLINIC_OR_DEPARTMENT_OTHER)
Admit: 2020-10-22 | Discharge: 2020-10-22 | Disposition: A | Discharge status: Home / Self Care | Payer: Self-pay | Attending: Internal Medicine | Admitting: Internal Medicine

## 2020-10-22 ENCOUNTER — Encounter: Payer: Self-pay | Admitting: Internal Medicine

## 2020-10-22 DIAGNOSIS — I5022 Chronic systolic (congestive) heart failure: Secondary | ICD-10-CM

## 2020-10-22 DIAGNOSIS — I5042 Chronic combined systolic (congestive) and diastolic (congestive) heart failure: Secondary | ICD-10-CM

## 2020-10-22 LAB — ECHOCARDIOGRAM COMPLETE BUBBLE STUDY
AR max vel: 1.33 cm2
AV Peak grad: 14.7 mmHg
Ao pk vel: 1.92 m/s
Area-P 1/2: 4.21 cm2
Calc EF: 28.4 %
S' Lateral: 6.01 cm
Single Plane A2C EF: 33.2 %
Single Plane A4C EF: 24.4 %

## 2020-10-22 LAB — LIPID PANEL
Cholesterol: 102 mg/dL (ref 0–200)
HDL: 30 mg/dL — ABNORMAL LOW (ref >40)
LDL Cholesterol: 63 mg/dL (ref 0–99)
Total CHOL/HDL Ratio: 3.4 RATIO
Triglycerides: 46 mg/dL (ref <150)
VLDL: 9 mg/dL (ref 0–40)

## 2020-10-22 LAB — HEMOGLOBIN A1C
Hgb A1c MFr Bld: 5.9 % — ABNORMAL HIGH (ref 4.8–5.6)
Mean Plasma Glucose: 122.63 mg/dL

## 2020-10-22 LAB — HIV ANTIBODY (ROUTINE TESTING W REFLEX): HIV Screen 4th Generation wRfx: NONREACTIVE

## 2020-10-22 IMAGING — CT CT ANGIO HEAD
2 of 11 series · 7 of 35 positions shown · IV contrast (APPLIED)
Comparison: Head MRI [DATE]. Carotid Doppler ultrasound
[DATE].

CLINICAL DATA: Increased dizziness for 2 weeks.

EXAM:
CT ANGIOGRAPHY HEAD AND NECK
TECHNIQUE: Multidetector CT imaging of the head and neck was performed using
the standard protocol during bolus administration of intravenous
contrast. Multiplanar CT image reconstructions and MIPs were
obtained to evaluate the vascular anatomy. Carotid stenosis
measurements (when applicable) are obtained utilizing NASCET
criteria, using the distal internal carotid diameter as the
denominator.
CONTRAST:  75mL OMNIPAQUE IOHEXOL 350 MG/ML SOLN

[Series 10: cta head neck thins · axial · 0.60mm/px · z∈[+166,+402]mm · 5 of 715 slices shown]
[im 120/715  soft-tissue]
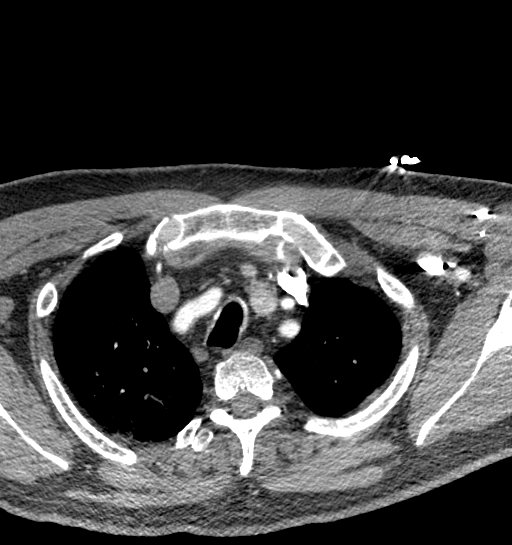
[im 239/715  bone]
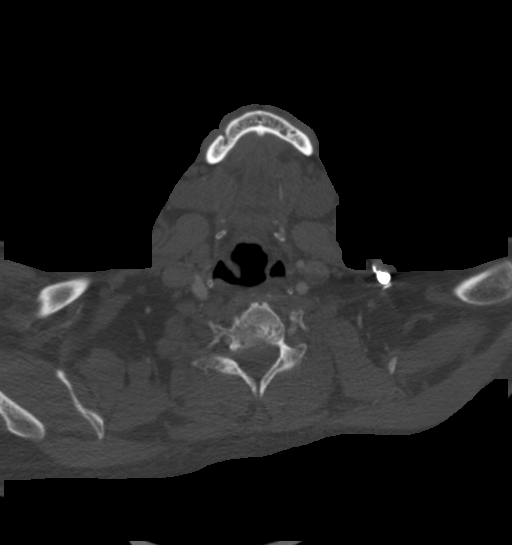
[im 358/715  soft-tissue]
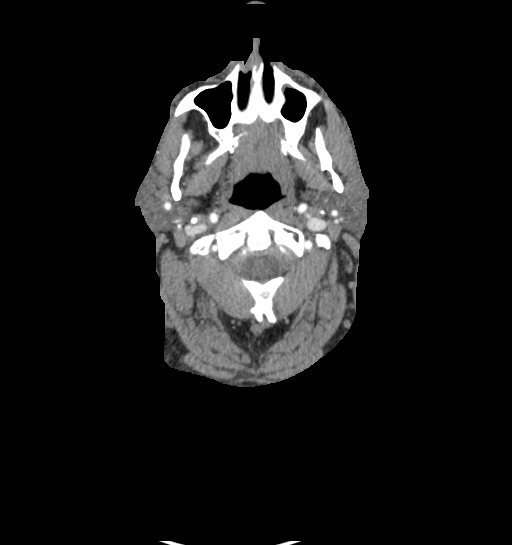
[im 477/715  bone]
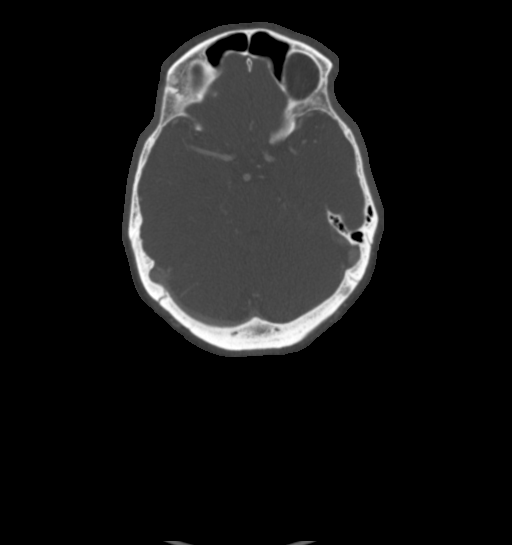
[im 596/715  soft-tissue]
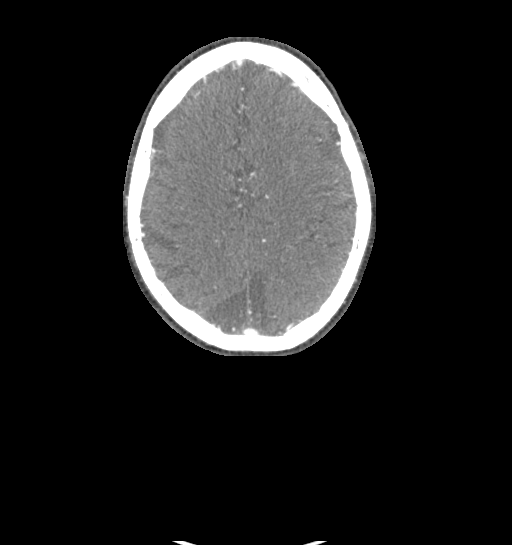

[Series 11: ax thin · axial · 0.60mm/px · z∈[+225,+343]mm · 2 of 358 slices shown]
[im 120/358  soft-tissue]
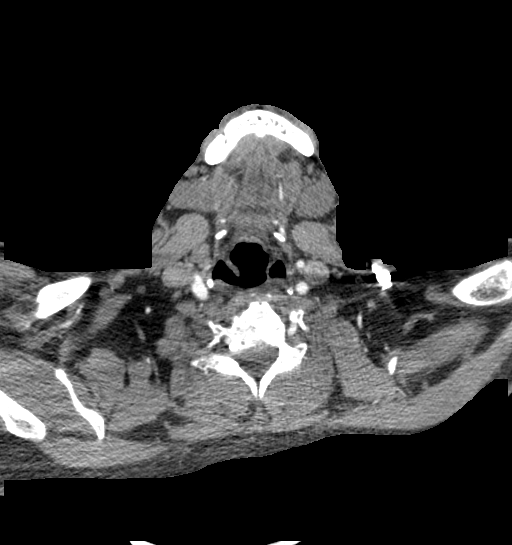
[im 239/358  soft-tissue]
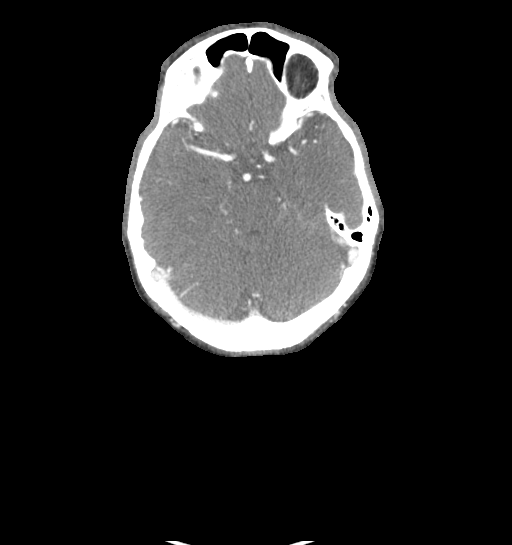

[7 of 35 positions shown; findings below may reference images not displayed]

FINDINGS: CT HEAD FINDINGS

Brain: No acute large territory infarct, intracranial hemorrhage,
mass, midline shift, or extra-axial fluid collection is identified.
Hypodensities in the cerebral white matter bilaterally are
nonspecific but compatible with mild chronic small vessel ischemic
disease. Chronic infarcts are again noted in the basal ganglia
bilaterally. Small chronic cerebellar infarcts on MRI are not well
demonstrated by CT. Mild cerebral atrophy is most notable in the
parietal regions.

Vascular: Calcified atherosclerosis at the skull base.

Skull: No fracture or suspicious osseous lesion.

Sinuses: Mild bilateral ethmoid sinus mucosal thickening. Clear
mastoid air cells. Prominent cerumen in the external auditory
canals.

Orbits: Unremarkable.

Review of the MIP images confirms the above findings

CTA NECK FINDINGS

Aortic arch: Standard 3 vessel aortic arch with mild atherosclerotic
plaque. No arch vessel origin stenosis.

Right carotid system: Patent with a small amount of calcified plaque
at the carotid bifurcation. No evidence of stenosis or dissection.

Left carotid system: Patent with a small amount of calcified and
soft plaque at the carotid bifurcation. No evidence of stenosis or
dissection.

Vertebral arteries: The vertebral arteries are patent without
evidence of stenosis, dissection, or significant atherosclerosis.
The left vertebral artery is mildly dominant.

Skeleton: Advanced disc degeneration at C5-6 and C6-7 with interbody
fusion at the former. Severe multilevel cervical facet arthrosis,
asymmetrically greater on the right.

Other neck: No evidence of cervical lymphadenopathy or mass.

Upper chest: Moderate centrilobular and paraseptal emphysema.

Review of the MIP images confirms the above findings

CTA HEAD FINDINGS

Anterior circulation: The internal carotid arteries are patent from
skull base to carotid termini with atherosclerotic plaque resulting
in mild right and mild-to-moderate left cavernous segment stenoses.
There is tortuosity of the left greater than right cavernous
segments. ACAs and MCAs are patent with mild-to-moderate branch
vessel atherosclerotic type irregularity but no evidence of a
proximal branch occlusion or significant proximal stenosis. No
aneurysm is identified.

Posterior circulation: The intracranial vertebral arteries are
widely patent to the basilar. Patent left PICA, right AICA, and
bilateral SCA origins are visualized. The basilar artery is widely
patent. Posterior communicating arteries are diminutive or absent.
Both PCAs are patent, and there are moderate right and severe left
distal P2 stenoses. No aneurysm is identified.

Venous sinuses: Patent.

Anatomic variants: None.

Review of the MIP images confirms the above findings
IMPRESSION: 1. Intracranial atherosclerosis including mild right and
mild-to-moderate left cavernous ICA and moderate right and severe
left P2 stenoses.
2. Mild cervical carotid atherosclerosis without significant
stenosis.
3. Widely patent vertebral arteries.
4. Aortic Atherosclerosis ([IJ]-[IJ]) and Emphysema ([IJ]-[IJ]).

## 2020-10-22 IMAGING — CT CT ANGIO NECK
1 of 10 series · 6 of 35 positions shown · IV contrast (omnipaque)
Comparison: Head MRI [DATE]. Carotid Doppler ultrasound
[DATE].

CLINICAL DATA: Increased dizziness for 2 weeks.

EXAM:
CT ANGIOGRAPHY HEAD AND NECK
TECHNIQUE: Multidetector CT imaging of the head and neck was performed using
the standard protocol during bolus administration of intravenous
contrast. Multiplanar CT image reconstructions and MIPs were
obtained to evaluate the vascular anatomy. Carotid stenosis
measurements (when applicable) are obtained utilizing NASCET
criteria, using the distal internal carotid diameter as the
denominator.
CONTRAST:  75mL OMNIPAQUE IOHEXOL 350 MG/ML SOLN

[Series 11: ax thin · axial · 0.60mm/px · z∈[+157,+411]mm · 6 of 358 slices shown]
[im 52/358  soft-tissue]
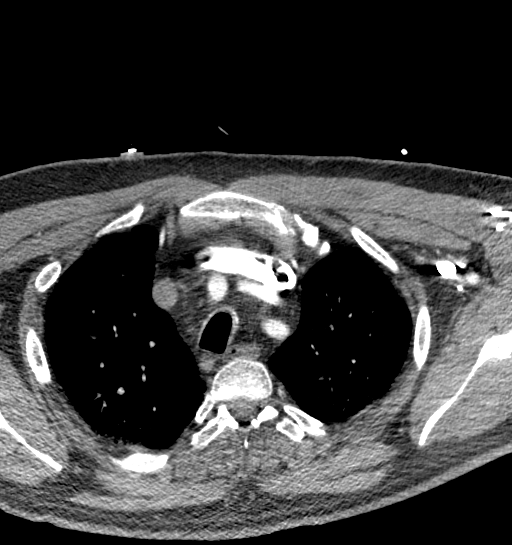
[im 103/358  bone]
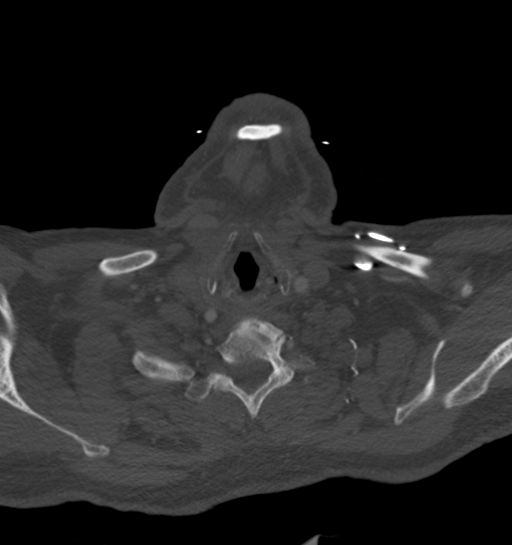
[im 154/358  soft-tissue]
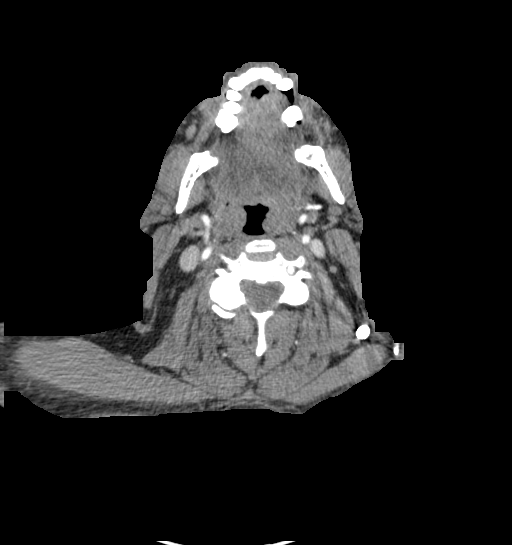
[im 205/358  bone]
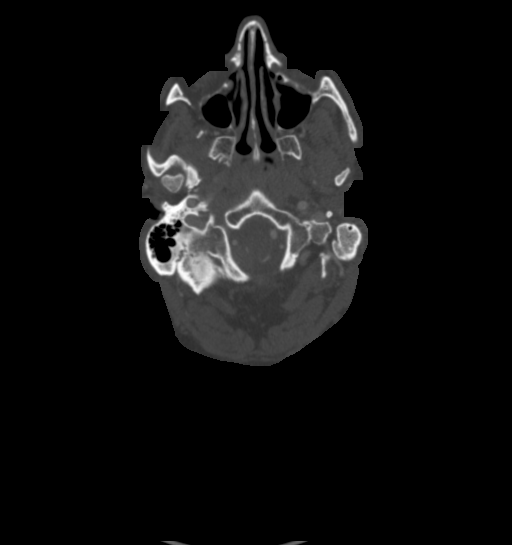
[im 256/358  soft-tissue]
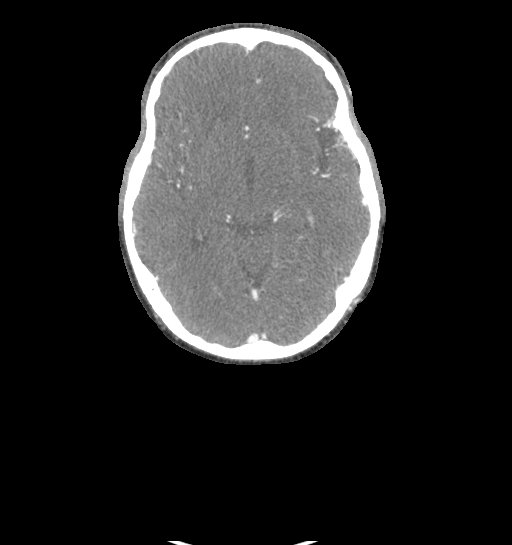
[im 307/358  bone]
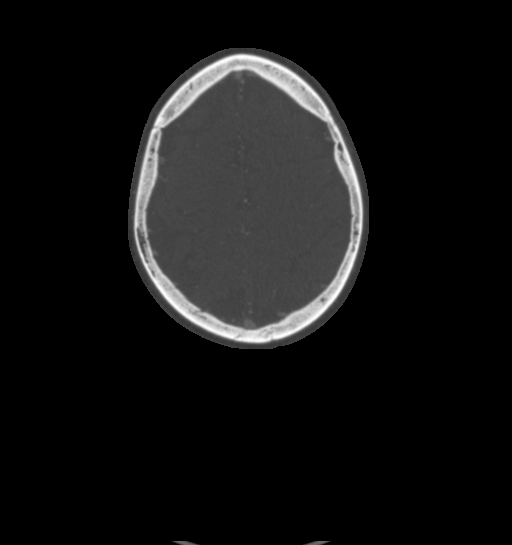

[6 of 35 positions shown; findings below may reference images not displayed]

FINDINGS: CT HEAD FINDINGS

Brain: No acute large territory infarct, intracranial hemorrhage,
mass, midline shift, or extra-axial fluid collection is identified.
Hypodensities in the cerebral white matter bilaterally are
nonspecific but compatible with mild chronic small vessel ischemic
disease. Chronic infarcts are again noted in the basal ganglia
bilaterally. Small chronic cerebellar infarcts on MRI are not well
demonstrated by CT. Mild cerebral atrophy is most notable in the
parietal regions.

Vascular: Calcified atherosclerosis at the skull base.

Skull: No fracture or suspicious osseous lesion.

Sinuses: Mild bilateral ethmoid sinus mucosal thickening. Clear
mastoid air cells. Prominent cerumen in the external auditory
canals.

Orbits: Unremarkable.

Review of the MIP images confirms the above findings

CTA NECK FINDINGS

Aortic arch: Standard 3 vessel aortic arch with mild atherosclerotic
plaque. No arch vessel origin stenosis.

Right carotid system: Patent with a small amount of calcified plaque
at the carotid bifurcation. No evidence of stenosis or dissection.

Left carotid system: Patent with a small amount of calcified and
soft plaque at the carotid bifurcation. No evidence of stenosis or
dissection.

Vertebral arteries: The vertebral arteries are patent without
evidence of stenosis, dissection, or significant atherosclerosis.
The left vertebral artery is mildly dominant.

Skeleton: Advanced disc degeneration at C5-6 and C6-7 with interbody
fusion at the former. Severe multilevel cervical facet arthrosis,
asymmetrically greater on the right.

Other neck: No evidence of cervical lymphadenopathy or mass.

Upper chest: Moderate centrilobular and paraseptal emphysema.

Review of the MIP images confirms the above findings

CTA HEAD FINDINGS

Anterior circulation: The internal carotid arteries are patent from
skull base to carotid termini with atherosclerotic plaque resulting
in mild right and mild-to-moderate left cavernous segment stenoses.
There is tortuosity of the left greater than right cavernous
segments. ACAs and MCAs are patent with mild-to-moderate branch
vessel atherosclerotic type irregularity but no evidence of a
proximal branch occlusion or significant proximal stenosis. No
aneurysm is identified.

Posterior circulation: The intracranial vertebral arteries are
widely patent to the basilar. Patent left PICA, right AICA, and
bilateral SCA origins are visualized. The basilar artery is widely
patent. Posterior communicating arteries are diminutive or absent.
Both PCAs are patent, and there are moderate right and severe left
distal P2 stenoses. No aneurysm is identified.

Venous sinuses: Patent.

Anatomic variants: None.

Review of the MIP images confirms the above findings
IMPRESSION: 1. Intracranial atherosclerosis including mild right and
mild-to-moderate left cavernous ICA and moderate right and severe
left P2 stenoses.
2. Mild cervical carotid atherosclerosis without significant
stenosis.
3. Widely patent vertebral arteries.
4. Aortic Atherosclerosis ([IJ]-[IJ]) and Emphysema ([IJ]-[IJ]).

## 2020-10-22 MED ORDER — METHOCARBAMOL 750 MG PO TABS
750.0000 mg | ORAL_TABLET | Freq: Once | ORAL | Status: AC
  Administered 2020-10-22: 10:00:00 750 mg via ORAL
  Filled 2020-10-22: qty 1

## 2020-10-22 MED ORDER — CLOPIDOGREL BISULFATE 75 MG PO TABS
300.0000 mg | ORAL_TABLET | Freq: Once | ORAL | Status: AC
  Administered 2020-10-22: 10:00:00 300 mg via ORAL
  Filled 2020-10-22: qty 4

## 2020-10-22 MED ORDER — CLOPIDOGREL BISULFATE 75 MG PO TABS
75.0000 mg | ORAL_TABLET | Freq: Every day | ORAL | Status: DC
  Administered 2020-10-23: 10:00:00 75 mg via ORAL
  Filled 2020-10-22: qty 1

## 2020-10-22 MED ORDER — SPIRONOLACTONE 25 MG PO TABS
25.0000 mg | ORAL_TABLET | Freq: Every day | ORAL | Status: DC
  Administered 2020-10-22 – 2020-10-23 (×2): 25 mg via ORAL
  Filled 2020-10-22 (×2): qty 1

## 2020-10-22 MED ORDER — NICOTINE 21 MG/24HR TD PT24
21.0000 mg | MEDICATED_PATCH | Freq: Every day | TRANSDERMAL | Status: DC
  Administered 2020-10-22: 21 mg via TRANSDERMAL
  Filled 2020-10-22: qty 1

## 2020-10-22 MED ORDER — METOPROLOL SUCCINATE ER 50 MG PO TB24
50.0000 mg | ORAL_TABLET | Freq: Every day | ORAL | Status: DC
  Administered 2020-10-23: 10:00:00 50 mg via ORAL
  Filled 2020-10-22 (×2): qty 1

## 2020-10-22 MED ORDER — METHOCARBAMOL 500 MG PO TABS
500.0000 mg | ORAL_TABLET | Freq: Three times a day (TID) | ORAL | Status: DC | PRN
  Administered 2020-10-22 – 2020-10-23 (×2): 500 mg via ORAL
  Filled 2020-10-22 (×3): qty 1

## 2020-10-22 MED ORDER — LOSARTAN POTASSIUM 50 MG PO TABS
50.0000 mg | ORAL_TABLET | Freq: Every day | ORAL | Status: DC
  Administered 2020-10-22 – 2020-10-23 (×2): 50 mg via ORAL
  Filled 2020-10-22 (×2): qty 1

## 2020-10-22 MED ORDER — DIPHENHYDRAMINE HCL 25 MG PO CAPS
25.0000 mg | ORAL_CAPSULE | Freq: Every evening | ORAL | Status: DC | PRN
  Administered 2020-10-22: 25 mg via ORAL
  Filled 2020-10-22: qty 1

## 2020-10-22 MED ORDER — IOHEXOL 350 MG/ML SOLN
75.0000 mL | Freq: Once | INTRAVENOUS | Status: AC | PRN
  Administered 2020-10-22: 75 mL via INTRAVENOUS

## 2020-10-22 NOTE — Progress Notes (Incomplete)
*  PRELIMINARY RESULTS* Echocardiogram 2D Echocardiogram has been performed.  Brian Porter 10/22/2020, 12:32 PM

## 2020-10-22 NOTE — Progress Notes (Signed)
Pt admitted to smoking in the bathroom when questioned about the strong smoke aroma in his room.  Pt agreed to not to that again

## 2020-10-22 NOTE — Progress Notes (Signed)
OT Cancellation Note  Patient Details Name: Brian Porter MRN: [id number] DOB: 05/20/1966   Cancelled Treatment:    Reason Eval/Treat Not Completed: OT screened, no needs identified, will sign off. Order received, chart reviewed. Upon arrival to room, pt sitting EOB reporting that he is at baseline functional independence with ADLs and mobility. No skilled OT needs identified. Will sign off. Please re-consult if additional needs arise.   Matthew Folks, OTR/L [facility] [phone]

## 2020-10-22 NOTE — Plan of Care (Signed)

## 2020-10-22 NOTE — Evaluation (Signed)
Physical Therapy Evaluation Patient Details Name: Brian Porter MRN: [id number] DOB: [birth year] Today's Date: 10/22/2020   History of Present Illness  Patient is a male with medical history significant for tobacco abuse,GERD, hyperlipidemia, hypertension, presents to the emergency department for chief concerns of dizziness. Work-up ongoing for stroke like symptoms. MRI reported punctate focus of apparent restricted diffusion within the paramedian right midbrain that may reflect possible acute infarct. Also with chronic lacunar infarcts within the bilateral corona radiata/basal ganglia, callosal body and bilateral cerebellar hemispheres. Also with earwax buildup s/p washout    Clinical Impression  Patient agreeable to PT evaluation. Patient does state he has mild dizziness with walking and reports he has mild bilateral LE whole leg pain with prolonged ambulation (right side more than left). Despite mild dizziness with ambulation, this does not appear to greatly impact functional independence. No loss of balance with high level balance activity including reaching outside base of support, head turns, changing gait speed, abrupt stops while ambulating, and while navigating obstacles. No focal weakness is noted in exam. Patient ambulated 644ft without assistive device. Heart rate in the mid 80's with activity. Given high level of mobility, no further acute PT needs are identified at this time. Education completed.      Follow Up Recommendations No PT follow up    Equipment Recommendations   (none)    Recommendations for Other Services       Precautions / Restrictions Precautions Precautions: Fall Restrictions Weight Bearing Restrictions: No      Mobility  Bed Mobility               General bed mobility comments: not addressed this session as patient sitting up on arrival and post session    Transfers Overall transfer level: Needs assistance Equipment used: None Transfers: Sit  to/from Stand Sit to Stand: Independent            Ambulation/Gait Ambulation/Gait assistance: Modified independent (Device/Increase time) Gait Distance (Feet): 600 Feet Assistive device: None Gait Pattern/deviations: WFL(Within Functional Limits)     General Gait Details: Patient with no loss of balance with ambulation including navigation around obstacles, with head turns, slow gait speed, fast gait speed. mild dizziness is reported with ambulation and mild BLE leg pain is reported towards the end of walking period.  Stairs            Wheelchair Mobility    Modified Rankin (Stroke Patients Only)       Balance   Sitting-balance support: Feet unsupported;No upper extremity supported Sitting balance-Leahy Scale: Normal                       High level balance activites: Direction changes;Turns;Sudden stops;Head turns High Level Balance Comments: no loss of balance while challenged with functional tasks listed above. cues for safety/technique as patient does have mild dizziness while walking             Pertinent Vitals/Pain Pain Assessment:  (mild BLE leg pain reported with activity (right more than left))    Home Living Family/patient expects to be discharged to:: Private residence Living Arrangements:  (patient states he lives with a girlfriend) Available Help at Discharge: Available PRN/intermittently;Friend(s)   Home Access: Level entry     Home Layout: One level        Prior Function Level of Independence: Independent               Hand Dominance   Dominant Hand: Right  Extremity/Trunk Assessment   Upper Extremity Assessment Upper Extremity Assessment: RUE deficits/detail;LUE deficits/detail RUE Deficits / Details: shoulder flexion, elbow flexion/extension 5/5. good grip strength RUE Sensation: WNL LUE Deficits / Details: shoulder flexion, elbow flexion/extension 5/5. good grip strength LUE Sensation: WNL    Lower  Extremity Assessment Lower Extremity Assessment: RLE deficits/detail;LLE deficits/detail RLE Deficits / Details: 5/5 strength dorsiflexion/plantarflexion. some guarding with knee extension patient states is due to pain with activity. no knee buckling with activity RLE Sensation: WNL LLE Deficits / Details: 5/5 strength dorsiflexion, plantarflexion, knee extension LLE Sensation: WNL       Communication   Communication: No difficulties  Cognition Arousal/Alertness: Awake/alert Behavior During Therapy: WFL for tasks assessed/performed Overall Cognitive Status: Within Functional Limits for tasks assessed                                        General Comments      Exercises     Assessment/Plan    PT Assessment Patent does not need any further PT services  PT Problem List         PT Treatment Interventions      PT Goals (Current goals can be found in the Care Plan section)  Acute Rehab PT Goals Patient Stated Goal: to have less dizziness, go home PT Goal Formulation: All assessment and education complete, DC therapy Time For Goal Achievement: 10/22/20    Frequency     Barriers to discharge        Co-evaluation               AM-PAC PT "6 Clicks" Mobility  Outcome Measure Help needed turning from your back to your side while in a flat bed without using bedrails?: None Help needed moving from lying on your back to sitting on the side of a flat bed without using bedrails?: None Help needed moving to and from a bed to a chair (including a wheelchair)?: None Help needed standing up from a chair using your arms (e.g., wheelchair or bedside chair)?: None Help needed to walk in hospital room?: None Help needed climbing 3-5 steps with a railing? : None 6 Click Score: 24    End of Session   Activity Tolerance: Patient tolerated treatment well Patient left:  (sitting up in bed) Nurse Communication: Mobility status PT Visit Diagnosis: Other symptoms  and signs involving the nervous system (R29.898)    Time: 1914-7829 PT Time Calculation (min) (ACUTE ONLY): 27 min   Charges:   PT Evaluation $PT Eval Moderate Complexity: 1 Mod PT Treatments $Neuromuscular Re-education: 8-22 mins       Donna Bernard, PT, MPT   Ina Homes 10/22/2020, 9:59 AM

## 2020-10-22 NOTE — Hospital Course (Signed)
Brian Porter is a 55 y.o. male with medical history significant for tobacco abuse,GERD, hyperlipidemia, hypertension, presented to the ED on 10/21/20 for chief concerns of dizziness he described as as light-headed, like the room was spinning.  He states that this happens when he was out in his yard.  The initial episode was last Tuesday, 10/17/20, he was walking in [place], he felt right sided tingling and numbnesss.  He endorses feeling imbalance.  Denied prior history of similar symptoms.

## 2020-10-22 NOTE — Consult Note (Signed)
Neurology Consultation  Reason for Consult: Dizziness, stroke Referring Physician: Esaw Grandchild, hospitalist  CC: Dizziness, stroke  History is obtained from: Patient, chart  HPI: Brian Porter is a 55 y.o. male past medical history of coronary artery disease, hypertension, sleep apnea, tobacco abuse, chronic systolic congestive heart failure, on aspirin, comes in for evaluation of dizziness-that he describes as lightheadedness. He describes that he has been having intermittent dizziness that has become worse since 10/17/2020.  Also reports some tingling and numbness-initially said that it felt more on the right side but then says that it feels more in his legs and both hands.  He was seen in the emergency room on 10/19/2020, MRI was done that showed a possible punctate brainstem infarct, he was recommended to stay and get a full neurological consultation but he decided to follow-up outpatient.  Due to the worsening of symptoms and him not getting better, he returned to the ER and was admitted for further work-up. He denies any prior history of stroke.  Denies any current unilateral tingling numbness or weakness but reports more of numbness and tingling in his legs along with pain in both his legs worse on walking, as well as tingling in his hands. No speech difficulties of acute onset but reports that he has off-and-on word finding difficulties. No slurred speech.  No headaches.  No double vision.   LKW: Unclear tpa given?: no, unclear last known well- questionable stroke already on MRI Premorbid modified Rankin scale (mRS): 0  ROS: Full ROS was performed and is negative except as noted in the HPI  Past Medical History:  Diagnosis Date  . CAD (coronary artery disease)   . CHF (congestive heart failure) (HCC)   . GERD (gastroesophageal reflux disease)   . Hypertension   . MI, old   . Sleep apnea    Family History  Problem Relation Age of Onset  . Heart failure Mother   .  Hypertension Mother   . Other Mother        covid pneumonia  . Heart attack Father 60  . Diabetes Father   . Diabetes Mellitus II Brother      Social History:   reports that he has been smoking cigarettes. He has been smoking about 0.50 packs per day. He has never used smokeless tobacco. He reports previous drug use. He reports that he does not drink alcohol.  Medications  Current Facility-Administered Medications:  .  acetaminophen (TYLENOL) tablet 1,000 mg, 1,000 mg, Oral, Q6H PRN, 1,000 mg at 10/22/20 0525 **OR** acetaminophen (TYLENOL) 160 MG/5ML solution 650 mg, 650 mg, Per Tube, Q4H PRN **OR** acetaminophen (TYLENOL) suppository 650 mg, 650 mg, Rectal, Q4H PRN, Cox, Amy N, DO .  aspirin EC tablet 81 mg, 81 mg, Oral, Daily, Cox, Amy N, DO, 81 mg at 10/21/20 1818 .  atorvastatin (LIPITOR) tablet 40 mg, 40 mg, Oral, QHS, Cox, Amy N, DO, 40 mg at 10/21/20 2035 .  carbamide peroxide (DEBROX) 6.5 % OTIC (EAR) solution 5 drop, 5 drop, Both EARS, BID, Cox, Amy N, DO, 5 drop at 10/21/20 1608 .  enoxaparin (LOVENOX) injection 40 mg, 40 mg, Subcutaneous, Q24H, Cox, Amy N, DO, 40 mg at 10/21/20 1818 .  losartan (COZAAR) tablet 50 mg, 50 mg, Oral, Daily, Cox, Amy N, DO .  metoprolol succinate (TOPROL-XL) 24 hr tablet 50 mg, 50 mg, Oral, Daily, Cox, Amy N, DO .  senna-docusate (Senokot-S) tablet 1 tablet, 1 tablet, Oral, QHS PRN, Cox, Amy N, DO .  spironolactone (ALDACTONE) tablet 25 mg, 25 mg, Oral, Daily, Cox, Amy N, DO   Exam: Current vital signs: BP 111/66 (BP Location: Right Arm)   Pulse (!) 56   Temp 97.8 F (36.6 C)   Resp 14   Ht 5\' 9"  (1.753 m)   Wt 99 kg   SpO2 98%   BMI 32.23 kg/m  Vital signs in last 24 hours: Temp:  [97.6 F (36.4 C)-99.3 F (37.4 C)] 97.8 F (36.6 C) (04/24 0804) Pulse Rate:  [49-80] 56 (04/24 0804) Resp:  [14-19] 14 (04/24 0804) BP: (100-133)/(44-84) 111/66 (04/24 0804) SpO2:  [92 %-99 %] 98 % (04/24 0804) Weight:  [99 kg] 99 kg (04/23  1501)  GENERAL: Awake, alert in NAD HEENT: - Normocephalic and atraumatic, dry mm, no LN++, no Thyromegally LUNGS - Clear to auscultation bilaterally with no wheezes CV - S1S2 RRR, no m/r/g, equal pulses bilaterally. ABDOMEN - Soft, nontender, nondistended with normoactive BS Ext: warm, well perfused, intact peripheral pulses, no edema  NEURO:  Mental Status: AA&Ox3 Language: speech is clear.  Naming, repetition, fluency, and comprehension intact. Cranial Nerves: PERR. EOMI, visual fields full, no facial asymmetry, facial sensation intact, hearing intact, tongue/uvula/soft palate midline, normal sternocleidomastoid and trapezius muscle strength. No evidence of tongue atrophy or fibrillations Motor:5/5 in all fours without drift Tone: is normal and bulk is normal Sensation- Intact to light touch bilaterally Coordination: FTN intact bilaterally, no ataxia in BLE. Gait- deferred NIHSS-0  Labs I have reviewed labs in epic and the results pertinent to this consultation are: CBC    Component Value Date/Time   WBC 13.2 (H) 10/21/2020 1533   RBC 4.65 10/21/2020 1533   HGB 14.9 10/21/2020 1533   HGB 15.1 09/21/2013 1245   HCT 43.9 10/21/2020 1533   HCT 45.9 09/21/2013 1245   PLT 365 10/21/2020 1533   PLT 358 09/21/2013 1245   MCV 94.4 10/21/2020 1533   MCV 93 09/21/2013 1245   MCH 32.0 10/21/2020 1533   MCHC 33.9 10/21/2020 1533   RDW 13.7 10/21/2020 1533   RDW 13.3 09/21/2013 1245   LYMPHSABS 4.3 (H) 10/21/2020 1533   MONOABS 2.2 (H) 10/21/2020 1533   EOSABS 0.6 (H) 10/21/2020 1533   BASOSABS 0.1 10/21/2020 1533    CMP     Component Value Date/Time   NA 139 10/21/2020 1533   NA 140 08/31/2020 2009   NA 139 09/21/2013 1245   K 4.2 10/21/2020 1533   K 4.3 09/21/2013 1245   CL 106 10/21/2020 1533   CL 106 09/21/2013 1245   CO2 25 10/21/2020 1533   CO2 30 09/21/2013 1245   GLUCOSE 91 10/21/2020 1533   GLUCOSE 100 (H) 09/21/2013 1245   BUN 16 10/21/2020 1533   BUN 16  08/31/2020 2009   BUN 15 09/21/2013 1245   CREATININE 0.63 10/21/2020 1533   CREATININE 0.97 09/21/2013 1245   CALCIUM 9.0 10/21/2020 1533   CALCIUM 8.3 (L) 09/21/2013 1245   PROT 7.0 10/21/2020 1533   PROT 6.9 08/31/2020 2009   PROT 7.4 09/21/2013 1245   ALBUMIN 3.7 10/21/2020 1533   ALBUMIN 4.0 08/31/2020 2009   ALBUMIN 3.6 09/21/2013 1245   AST 15 10/21/2020 1533   AST 15 09/21/2013 1245   ALT 18 10/21/2020 1533   ALT 24 09/21/2013 1245   ALKPHOS 50 10/21/2020 1533   ALKPHOS 76 09/21/2013 1245   BILITOT 0.7 10/21/2020 1533   BILITOT 0.3 08/31/2020 2009   BILITOT 0.5 09/21/2013 1245   GFRNONAA >  60 10/21/2020 1533   GFRNONAA >60 09/21/2013 1245   GFRAA >60 09/02/2019 1025   GFRAA >60 09/21/2013 1245    Lipid Panel     Component Value Date/Time   CHOL 102 10/22/2020 0444   CHOL 169 08/31/2020 2009   TRIG 46 10/22/2020 0444   HDL 30 (L) 10/22/2020 0444   HDL 31 (L) 08/31/2020 2009   CHOLHDL 3.4 10/22/2020 0444   VLDL 9 10/22/2020 0444   LDLCALC 63 10/22/2020 0444   LDLCALC 114 (H) 08/31/2020 2009   LDL 63 A1c pending 2D echo pending Carotid Dopplers with bilateral carotid bifurcation plaque resulting in less than 50% ICA stenosis.  Imaging I have reviewed the images obtained: MRI examination of the brain-motion degraded exam limiting evaluation.  Punctate focus of apparent restricted diffusion within the paramedian right midbrain appreciated on axial DWI only.  Question acute infarct versus artifact. Chronic lacunar infarcts in bilateral corona radiata, basal ganglia, callosal body and bilateral cerebellar hemispheres.  Background mild cerebral atrophy and chronic small vessel ischemic disease. On my personal review, the punctate diffusion abnormality likely artifactual but given his risk factors could reflect a stroke.  Assessment:  55 year old with past history of coronary artery disease, hypertension, sleep apnea, tobacco abuse, chronic systolic congestive heart  failure, coming in for evaluation of dizziness and MRI with a question of a punctate midbrain stroke. Imaging finding rather questionable but given his risk factors, could reflect an acute stroke. His concerns of leg pain are likely claudication-pain gets worse with walking. No focal findings on my exam today.  Impression: Question punctate right midbrain infarct-likely small vessel etiology.  Has risk factors for embolic phenomenon as well (CHF).  Recommendations:  Telemetry  Frequent neurochecks  Load with Plavix 300 mg 1 time today and start Plavix 75 -in addition to aspirin 81. (ordered) - duration of DAPT to be determined based on the CTA and 2D echo results.  CTA head and neck - (ordered).   Continue atorvastatin 40mg  daily  2d echo and A1c pending  PT OT speech therapy  Counseled extensively on importance of smoking cessation. He verbalized understanding - will need to work with PCP on an ongoing basis for pharmacological and non-pharmacological approaches for cessation.  Management of leg pain and claudication to the primary team.  No need for permissive hypertension-symptoms have been going on for a few days.  Goal blood pressure as an outpatient is normotension.  We will follow.  Plan relayed to Dr. via secure chat -- Denton Lank, MD Neurologist [facility] Pager: [phone]

## 2020-10-22 NOTE — Progress Notes (Signed)
PROGRESS NOTE    Brian Porter [facility]   YFV:[id number]  DOB: October 27, 1965  PCP: Pcp, No    DOA: 10/21/2020 LOS: 0   Brief Narrative   Brian Porter is a 55 y.o. male with medical history significant for tobacco abuse,GERD, hyperlipidemia, hypertension, presented to the ED on 10/21/20 for chief concerns of dizziness he described as as light-headed, like the room was spinning.  He states that this happens when he was out in his yard.  The initial episode was last Tuesday, 10/17/20, he was walking in [place], he felt right sided tingling and numbnesss.  He endorses feeling imbalance.  Denied prior history of similar symptoms.    Assessment & Plan   Active Problems:   CHF (congestive heart failure) (HCC)   HTN (hypertension)   Tobacco abuse   CVA (cerebral vascular accident) (HCC)   ? Punctate right midbrain infarct-likely small vessel etiology -  With risk factors for embolic strokes (CHF). --neurology following --tele --loading Plavix --follow up pending Echo, CTA's --started on Lipitor, continue ASA --cleared by PT and OT --smoking cessation strongly advised, discussed w pt --no need for permissive HTN  Leg Pain - unclear etiology, muscle spasms vs ?claudication. --trial Robaxin today --ABI's ordered  Dizziness - unclear if due to ?CVA vs impacted cerumen vs vertigo vs congestion/allergies.  Ambulation stable.  Monitor.  Chronic mixed systolic/diastolic CHF - euvolemic, well compensated.  Echo pending.   Previous echo done in 05/24/2019 showed ejection fraction of 30 to 35%, left ventricle has moderate to severely decreased function.  Left ventricular diastolic parameters consistent with grade 1 diastolic dysfunction, impaired relaxation.  RV systolic function has normal systolic function. --on metop, losartan, spironolactone --monitor volume status  HLD - Lipitor Hypertension - stable.  Continue losartan, metoprolol, spironolactone  Obesity: Body mass index is 32.23 kg/m.   Complicates overall care and prognosis.  Recommend lifestyle modifications including physical activity and diet for weight loss and overall long-term health.   DVT prophylaxis: enoxaparin (LOVENOX) injection 40 mg Start: 10/21/20 1800 SCD's Start: 10/21/20 1708   Diet:  Diet Orders (From admission, onward)    Start     Ordered   10/21/20 1814  Diet Heart Room service appropriate? Yes; Fluid consistency: Thin  Diet effective now       Question Answer Comment  Room service appropriate? Yes   Fluid consistency: Thin      10/21/20 1813            Code Status: Full Code    Subjective 10/22/20    Pt doing well today.  Has some residual dizziness but mild.  Said did fine with therapy, no needs.  Has leg pain he describes as aching, comes and goes.  Does have some low back issues, but not sure he'd call the pain as shooting.  Worse with ambulating most of the time, better with rest.   Disposition Plan & Communication   Status is: Observation  Observation status appropriate as it is expected pt will not require more than 2 midnights.  Stroke evaluation is ongoing.  D/c home pending clearance by neurology  Dispo: The patient is from: home              Anticipated d/c is to: home              Patient currently not stable for d/c   Difficult to place patient no    Consults, Procedures, Significant Events   Consultants:   neurology  Procedures:  none  Antimicrobials:  Anti-infectives (From admission, onward)   None        Micro    Objective   Vitals:   10/22/20 0247 10/22/20 0454 10/22/20 0646 10/22/20 0804  BP: (!) 111/44 120/79 100/65 111/66  Pulse: (!) 49 62 62 (!) 56  Resp: 16 16 16 14   Temp: 98 F (36.7 C) 97.6 F (36.4 C) 98.2 F (36.8 C) 97.8 F (36.6 C)  TempSrc: Oral Oral Oral   SpO2: 97% 96% 97% 98%  Weight:      Height:        Intake/Output Summary (Last 24 hours) at 10/22/2020 0810 Last data filed at 10/21/2020 2000 Gross per 24 hour   Intake 240 ml  Output --  Net 240 ml   Filed Weights   10/21/20 1501  Weight: 99 kg    Physical Exam:  General exam: awake, alert, no acute distress, sounds congested HEENT: atraumatic, clear conjunctiva, anicteric sclera, moist mucus membranes, hearing grossly normal  Respiratory system: CTAB, no wheezes, rales or rhonchi, normal respiratory effort. Cardiovascular system: normal S1/S2, RRR, no JVD, murmurs, rubs, gallops, no pedal edema.   Gastrointestinal system: soft, NT Central nervous system: A&O x4 no gross focal neurologic deficits, normal speech Extremities: moves all, no edema, normal tone Psychiatry: normal mood, congruent affect, judgement and insight appear normal  Labs   Data Reviewed: I have personally reviewed following labs and imaging studies  CBC: Recent Labs  Lab 10/16/20 1518 10/19/20 1807 10/21/20 1533  WBC 12.5* 14.2* 13.2*  NEUTROABS  --   --  5.9  HGB 15.6 15.6 14.9  HCT 45.2 44.8 43.9  MCV 94.2 92.8 94.4  PLT 365 372 365   Basic Metabolic Panel: Recent Labs  Lab 10/16/20 1518 10/19/20 1807 10/21/20 1533  NA 138 138 139  K 3.9 3.8 4.2  CL 105 106 106  CO2 25 25 25   GLUCOSE 106* 86 91  BUN 15 18 16   CREATININE 0.92 0.70 0.63  CALCIUM 8.8* 9.0 9.0   GFR: Estimated Creatinine Clearance: 121 mL/min (by C-G formula based on SCr of 0.63 mg/dL). Liver Function Tests: Recent Labs  Lab 10/19/20 1807 10/21/20 1533  AST 17 15  ALT 19 18  ALKPHOS 52 50  BILITOT 0.7 0.7  PROT 7.1 7.0  ALBUMIN 3.8 3.7   No results for input(s): LIPASE, AMYLASE in the last 168 hours. No results for input(s): AMMONIA in the last 168 hours. Coagulation Profile: No results for input(s): INR, PROTIME in the last 168 hours. Cardiac Enzymes: No results for input(s): CKTOTAL, CKMB, CKMBINDEX, TROPONINI in the last 168 hours. BNP (last 3 results) No results for input(s): PROBNP in the last 8760 hours. HbA1C: No results for input(s): HGBA1C in the last 72  hours. CBG: No results for input(s): GLUCAP in the last 168 hours. Lipid Profile: Recent Labs    10/22/20 0444  CHOL 102  HDL 30*  LDLCALC 63  TRIG 46  CHOLHDL 3.4   Thyroid Function Tests: No results for input(s): TSH, T4TOTAL, FREET4, T3FREE, THYROIDAB in the last 72 hours. Anemia Panel: No results for input(s): VITAMINB12, FOLATE, FERRITIN, TIBC, IRON, RETICCTPCT in the last 72 hours. Sepsis Labs: No results for input(s): PROCALCITON, LATICACIDVEN in the last 168 hours.  Recent Results (from the past 240 hour(s))  Resp Panel by RT-PCR (Flu A&B, Covid) Nasopharyngeal Swab     Status: None   Collection Time: 10/16/20  3:31 PM   Specimen: Nasopharyngeal Swab; Nasopharyngeal(NP) swabs in  vial transport medium  Result Value Ref Range Status   SARS Coronavirus 2 by RT PCR NEGATIVE NEGATIVE Final    Comment: (NOTE) SARS-CoV-2 target nucleic acids are NOT DETECTED.  The SARS-CoV-2 RNA is generally detectable in upper respiratory specimens during the acute phase of infection. The lowest concentration of SARS-CoV-2 viral copies this assay can detect is 138 copies/mL. A negative result does not preclude SARS-Cov-2 infection and should not be used as the sole basis for treatment or other patient management decisions. A negative result may occur with  improper specimen collection/handling, submission of specimen other than nasopharyngeal swab, presence of viral mutation(s) within the areas targeted by this assay, and inadequate number of viral copies(<138 copies/mL). A negative result must be combined with clinical observations, patient history, and epidemiological information. The expected result is Negative.  Fact Sheet for Patients:  BloggerCourse.comhttps://www.fda.gov/media/152166/download  Fact Sheet for Healthcare Providers:  SeriousBroker.ithttps://www.fda.gov/media/152162/download  This test is no t yet approved or cleared by the [place] FDA and  has been authorized for detection and/or  diagnosis of SARS-CoV-2 by FDA under an Emergency Use Authorization (EUA). This EUA will remain  in effect (meaning this test can be used) for the duration of the COVID-19 declaration under Section 564(b)(1) of the Act, 21 U.S.C.section 360bbb-3(b)(1), unless the authorization is terminated  or revoked sooner.       Influenza A by PCR NEGATIVE NEGATIVE Final   Influenza B by PCR NEGATIVE NEGATIVE Final    Comment: (NOTE) The Xpert Xpress SARS-CoV-2/FLU/RSV plus assay is intended as an aid in the diagnosis of influenza from Nasopharyngeal swab specimens and should not be used as a sole basis for treatment. Nasal washings and aspirates are unacceptable for Xpert Xpress SARS-CoV-2/FLU/RSV testing.  Fact Sheet for Patients: BloggerCourse.comhttps://www.fda.gov/media/152166/download  Fact Sheet for Healthcare Providers: SeriousBroker.ithttps://www.fda.gov/media/152162/download  This test is not yet approved or cleared by the [place] FDA and has been authorized for detection and/or diagnosis of SARS-CoV-2 by FDA under an Emergency Use Authorization (EUA). This EUA will remain in effect (meaning this test can be used) for the duration of the COVID-19 declaration under Section 564(b)(1) of the Act, 21 U.S.C. section 360bbb-3(b)(1), unless the authorization is terminated or revoked.  Performed at [facility], [address] [place], [place] [address]   Resp Panel by RT-PCR (Flu A&B, Covid) Nasopharyngeal Swab     Status: None   Collection Time: 10/21/20  3:33 PM   Specimen: Nasopharyngeal Swab; Nasopharyngeal(NP) swabs in vial transport medium  Result Value Ref Range Status   SARS Coronavirus 2 by RT PCR NEGATIVE NEGATIVE Final    Comment: (NOTE) SARS-CoV-2 target nucleic acids are NOT DETECTED.  The SARS-CoV-2 RNA is generally detectable in upper respiratory specimens during the acute phase of infection. The lowest concentration of SARS-CoV-2 viral copies this assay can detect is 138  copies/mL. A negative result does not preclude SARS-Cov-2 infection and should not be used as the sole basis for treatment or other patient management decisions. A negative result may occur with  improper specimen collection/handling, submission of specimen other than nasopharyngeal swab, presence of viral mutation(s) within the areas targeted by this assay, and inadequate number of viral copies(<138 copies/mL). A negative result must be combined with clinical observations, patient history, and epidemiological information. The expected result is Negative.  Fact Sheet for Patients:  BloggerCourse.comhttps://www.fda.gov/media/152166/download  Fact Sheet for Healthcare Providers:  SeriousBroker.ithttps://www.fda.gov/media/152162/download  This test is no t yet approved or cleared by the [place] FDA and  has  been authorized for detection and/or diagnosis of SARS-CoV-2 by FDA under an Emergency Use Authorization (EUA). This EUA will remain  in effect (meaning this test can be used) for the duration of the COVID-19 declaration under Section 564(b)(1) of the Act, 21 U.S.C.section 360bbb-3(b)(1), unless the authorization is terminated  or revoked sooner.       Influenza A by PCR NEGATIVE NEGATIVE Final   Influenza B by PCR NEGATIVE NEGATIVE Final    Comment: (NOTE) The Xpert Xpress SARS-CoV-2/FLU/RSV plus assay is intended as an aid in the diagnosis of influenza from Nasopharyngeal swab specimens and should not be used as a sole basis for treatment. Nasal washings and aspirates are unacceptable for Xpert Xpress SARS-CoV-2/FLU/RSV testing.  Fact Sheet for Patients: BloggerCourse.com  Fact Sheet for Healthcare Providers: SeriousBroker.it  This test is not yet approved or cleared by the [place] FDA and has been authorized for detection and/or diagnosis of SARS-CoV-2 by FDA under an Emergency Use Authorization (EUA). This EUA will remain in effect (meaning  this test can be used) for the duration of the COVID-19 declaration under Section 564(b)(1) of the Act, 21 U.S.C. section 360bbb-3(b)(1), unless the authorization is terminated or revoked.  Performed at [facility], [address] [place], [place] [address]       Imaging Studies   US Carotid Bilateral (at [facility] and [facility] only)  Result Date: 10/21/2020 CLINICAL DATA:  Stroke. Hypertension, coronary artery disease, tobacco abuse EXAM: BILATERAL CAROTID DUPLEX ULTRASOUND TECHNIQUE: Wallace Cullens scale imaging, color Doppler and duplex ultrasound were performed of bilateral carotid and vertebral arteries in the neck. COMPARISON:  None. FINDINGS: Criteria: Quantification of carotid stenosis is based on velocity parameters that correlate the residual internal carotid diameter with NASCET-based stenosis levels, using the diameter of the distal internal carotid lumen as the denominator for stenosis measurement. The following velocity measurements were obtained: RIGHT ICA: 110/32 cm/sec CCA: 101/21 cm/sec SYSTOLIC ICA/CCA RATIO:  1.1 ECA: 110 cm/sec LEFT ICA: 98/35 cm/sec CCA: 77/21 cm/sec SYSTOLIC ICA/CCA RATIO:  1.3 ECA: 99 cm/sec RIGHT CAROTID ARTERY: Eccentric calcified plaque in the bulb and ICA origin with only mild stenosis. Normal waveforms and color Doppler signal. Mild tortuosity. RIGHT VERTEBRAL ARTERY:  Normal flow direction and waveform. LEFT CAROTID ARTERY: Mild eccentric plaque in the bulb. No no significant ICA stenosis. Normal waveforms and color Doppler signal. Mild tortuosity. LEFT VERTEBRAL ARTERY:  Normal flow direction and waveform. Bilateral morphologically unremarkable cervical lymph nodes are identified, none greater than 0.7 cm short axis diameter. IMPRESSION: 1. Bilateral carotid bifurcation plaque resulting in less than 50% diameter ICA stenosis. 2. Antegrade bilateral vertebral arterial flow. Electronically Signed   By: Corlis Leak M.D.   On: 10/21/2020 18:55     Medications    Scheduled Meds: . aspirin EC  81 mg Oral Daily  . atorvastatin  40 mg Oral QHS  . carbamide peroxide  5 drop Both EARS BID  . enoxaparin (LOVENOX) injection  40 mg Subcutaneous Q24H  . losartan  50 mg Oral Daily  . metoprolol succinate  50 mg Oral Daily  . spironolactone  25 mg Oral Daily   Continuous Infusions:     LOS: 0 days    Time spent: 30 minutes    Pennie Banter, DO [facility]  10/22/2020, 8:10 AM      If 7PM-7AM, please contact night-coverage. How to contact the [facility] Attending or Consulting provider 7A - 7P or covering provider during after hours 7P -7A, for this patient?  1. Check the care team in [facility] and look for a) attending/consulting [facility] provider listed and b) the [facility] team listed 2. Log into [email or web address] and use [place]'s universal password to access. If you do not have the password, please contact the hospital operator. 3. Locate the [facility] provider you are looking for under [facility] and page to a number that you can be directly reached. 4. If you still have difficulty reaching the provider, please page the [facility] (Director on Call) for the Hospitalists listed on amion for assistance.

## 2020-10-23 DIAGNOSIS — R42 Dizziness and giddiness: Secondary | ICD-10-CM

## 2020-10-23 LAB — GLUCOSE, CAPILLARY: Glucose-Capillary: 144 mg/dL — ABNORMAL HIGH (ref 70–99)

## 2020-10-23 MED ORDER — ATORVASTATIN CALCIUM 20 MG PO TABS: 20.0000 mg | ORAL_TABLET | Freq: Every day | ORAL | 2 refills | Status: DC

## 2020-10-23 MED ORDER — ASPIRIN 81 MG PO TBEC: 81.0000 mg | DELAYED_RELEASE_TABLET | Freq: Every day | ORAL | 2 refills | Status: AC

## 2020-10-23 MED ORDER — FUROSEMIDE 40 MG PO TABS: 20.0000 mg | ORAL_TABLET | Freq: Every day | ORAL | 1 refills | Status: DC

## 2020-10-23 MED ORDER — LOSARTAN POTASSIUM 50 MG PO TABS: 50.0000 mg | ORAL_TABLET | Freq: Every day | ORAL | 2 refills | Status: DC

## 2020-10-23 MED ORDER — SPIRONOLACTONE 25 MG PO TABS: 25.0000 mg | ORAL_TABLET | Freq: Every day | ORAL | 2 refills | Status: DC

## 2020-10-23 MED ORDER — NICOTINE 21 MG/24HR TD PT24: 21.0000 mg | MEDICATED_PATCH | Freq: Every day | TRANSDERMAL | 0 refills | Status: DC

## 2020-10-23 MED ORDER — CLOPIDOGREL BISULFATE 75 MG PO TABS: 75.0000 mg | ORAL_TABLET | Freq: Every day | ORAL | 0 refills | Status: DC

## 2020-10-23 MED ORDER — LOSARTAN POTASSIUM 25 MG PO TABS: 25.0000 mg | ORAL_TABLET | Freq: Every day | ORAL | 2 refills | Status: DC

## 2020-10-23 MED ORDER — METOPROLOL SUCCINATE ER 25 MG PO TB24: 25.0000 mg | ORAL_TABLET | Freq: Every day | ORAL | 2 refills | Status: DC

## 2020-10-23 MED ORDER — METOPROLOL SUCCINATE ER 50 MG PO TB24: 50.0000 mg | ORAL_TABLET | Freq: Every day | ORAL | 2 refills | Status: DC

## 2020-10-23 NOTE — Progress Notes (Signed)
Subjective: Feels the dizziness is slightly better.   Exam: Vitals:   10/23/20 0521 10/23/20 0729  BP: 126/75 114/76  Pulse: 64 69  Resp: 16 16  Temp: 98.3 F (36.8 C) 97.8 F (36.6 C)  SpO2: 97% 97%   Gen: In bed, NAD Resp: non-labored breathing, no acute distress Abd: soft, nt  Neuro: MS: awake, alert, interactive and appropriate CN:VFF, EOMI wihtout diplopia Motor: no drift.  Sensory:intact to LT  Pertinent Labs: LDL 63, no changes needed G8J 5.9 Echo - EF 25 - 30%, global hypokineses.  CTA - intracranial stenosis, but none that are likely related to his symptoms or his stroke.   Impression: 55 yo M with recurrent dizziness which I suspect is related to his CHF. There does not appear to be a cerebrovascular cause. His MRI finding is likely incidental or artifactual, but would treat as stroke given his risk factots. Agree with 3 weeks dual antiplatelet therapy from a stroke prevention standpoint with aspirin monotherapy thereafter(though if other indication for DAPT, no objection).  Recommendations: 1) ASA 81mg  and plavix 75mg  daily x 3 weeks, then asa  81mg  daily thereafter.  2) continue atorvastatin at current dose as LDL is at goal.  3) No need for permissive hypertension at this point, though could consider modification of regimen to help lightheadedness.   , MD [facility] [phone]  If 7pm- 7am, please page neurology on call as listed in [facility].

## 2020-10-23 NOTE — Discharge Instructions (Signed)
Follow with Primary MDin 7 days  ? ?Get CBC, CMP,  checked  by Primary MD next visit.  ? ? ?Activity: As tolerated with Full fall precautions use walker/cane & assistance as needed ? ?Disposition Home ? ? ?Diet: Heart Healthy  , with feeding assistance and aspiration precautions. ? ?For Heart failure patients - Check your Weight same time everyday, if you gain over 2 pounds, or you develop in leg swelling, experience more shortness of breath or chest pain, call your Primary MD immediately. Follow Cardiac Low Salt Diet and 1.5 lit/day fluid restriction. ? ? ?On your next visit with your primary care physician please Get Medicines reviewed and adjusted. ? ? ?Please request your Prim.MD to go over all Hospital Tests and Procedure/Radiological results at the follow up, please get all Hospital records sent to your Prim MD by signing hospital release before you go home. ? ? ?If you experience worsening of your admission symptoms, develop shortness of breath, life threatening emergency, suicidal or homicidal thoughts you must seek medical attention immediately by calling 911 or calling your MD immediately  if symptoms less severe. ? ?You Must read complete instructions/literature along with all the possible adverse reactions/side effects for all the Medicines you take and that have been prescribed to you. Take any new Medicines after you have completely understood and accpet all the possible adverse reactions/side effects.  ? ?Do not drive, operating heavy machinery, perform activities at heights, swimming or participation in water activities or provide baby sitting services if your were admitted for syncope or siezures until you have seen by Primary MD or a Neurologist and advised to do so again. ? ?Do not drive when taking Pain medications.  ? ? ?Do not take more than prescribed Pain, Sleep and Anxiety Medications ? ?Special Instructions: If you have smoked or chewed Tobacco  in the last 2 yrs please stop smoking, stop  any regular Alcohol  and or any Recreational drug use. ? ?Wear Seat belts while driving. ? ? ?Please note ? ?You were cared for by a hospitalist during your hospital stay. If you have any questions about your discharge medications or the care you received while you were in the hospital after you are discharged, you can call the unit and asked to speak with the hospitalist on call if the hospitalist that took care of you is not available. Once you are discharged, your primary care physician will handle any further medical issues. Please note that NO REFILLS for any discharge medications will be authorized once you are discharged, as it is imperative that you return to your primary care physician (or establish a relationship with a primary care physician if you do not have one) for your aftercare needs so that they can reassess your need for medications and monitor your lab values.  ? ?

## 2020-10-23 NOTE — Progress Notes (Signed)
SLP Cancellation Note  Patient Details Name: Brian Porter MRN: [id number] DOB: 1965-11-28   Cancelled treatment:       Reason Eval/Treat Not Completed: SLP screened, no needs identified, will sign off (chart reviewed; consulted NSG then met w/ pt). Pt denied any difficulty swallowing and is currently on a regular diet; tolerates swallowing pills w/ water per NSG. Pt conversed at conversational level w/ MD/staff w/out deficits noted; pt and NSG denied any speech-language deficits.  No further skilled ST services indicated as pt appears at his baseline. Pt agreed. NSG to reconsult if any change in status.       Orinda Kenner, MS, CCC-SLP Speech Language Pathologist [facility] [phone] [facility] 10/23/2020, 8:40 AM

## 2020-10-23 NOTE — Discharge Summary (Signed)
Physician Discharge Summary  Brian Porter [facility] ZOX:[id number] DOB: 02/15/1966 DOA: 10/21/2020  PCP: Pcp, No  Admit date: 10/21/2020 Discharge date: 10/23/2020  Admitted From: Home Disposition:  Home   Recommendations for Outpatient Follow-up:  1. Follow up with PCP in 1-2 weeks 2. Please obtain BMP/CBC in one week 3. Follow-up with vascular surgery as an outpatient regarding peripheral vascular disease  Home Health:NO Equipment/Devices:none  Discharge Condition:Stable CODE STATUS:FULL Diet recommendation: Heart Healthy   Brief/Interim Summary:  Brian Porter Durhamis a 55 y.o.malewith medical history significant fortobacco abuse,GERD,hyperlipidemia, hypertension,presented to the ED on 10/21/20 for chief concerns of dizziness he described as aslight-headed,like the room was spinning. He states that this happens when he was out in his yard.The initial episode was last Tuesday, 10/17/20, he was walking in [place], he felt right sided tingling and numbnesss.He endorses feeling imbalance. Denied prior history of similar symptoms.     Punctate right midbrain infarct -Neurology input greatly appreciated, patient main presentation is due to dizziness, symptoms felt more likely due to CHF and cardiac medications, MRI findings likely incidental or artifactual, but it would be treated as a stroke given his risk factors, recommendation for 3 weeks of dual antiplatelet therapy, aspirin and Plavix, then monotherapy with aspirin alone.  Continue with statin.   Leg Pain /claudication. -Due to peripheral vascular disease, ABI significant for mild peripheral vascular disease, to follow with vascular surgery as an outpatient.  Dizziness  -Likely in the setting of CHF and cardiac meds, I have lowered his losartan and metoprolol dose to help with soft  blood pressure.    Chronic mixed systolic/diastolic CHF - euvolemic, well compensated.  Previous echo done in 05/24/2019 showed ejection fraction of 30  to 35%, repeat echo this admission showing EF 25 to 30%, continue with Aldactone, metoprolol and losartan were resumed at a lower dose given soft blood pressure.  HLD - Lipitor  Hypertension - stable.  Continue losartan, metoprolol, spironolactone  Obesity: Body mass index is 32.23 kg/m.  Complicates overall care and prognosis.  Recommend lifestyle modifications including physical activity and diet for weight loss and overall long-term health.  Discharge Diagnoses:  Active Problems:   CHF (congestive heart failure) (HCC)   HTN (hypertension)   Tobacco abuse   CVA (cerebral vascular accident) [facility])    Discharge Instructions  Discharge Instructions    Discharge instructions   Complete by: As directed    Follow with Primary MD in 7 days   Get CBC, CMP,   checked  by Primary MD next visit.    Activity: As tolerated with Full fall precautions use walker/cane & assistance as needed   Disposition Home    Diet: Heart Healthy , with feeding assistance and aspiration precautions.  For Heart failure patients - Check your Weight same time everyday, if you gain over 2 pounds, or you develop in leg swelling, experience more shortness of breath or chest pain, call your Primary MD immediately. Follow Cardiac Low Salt Diet and 1.5 lit/day fluid restriction.   On your next visit with your primary care physician please Get Medicines reviewed and adjusted.   Please request your Prim.MD to go over all Hospital Tests and Procedure/Radiological results at the follow up, please get all Hospital records sent to your Prim MD by signing hospital release before you go home.   If you experience worsening of your admission symptoms, develop shortness of breath, life threatening emergency, suicidal or homicidal thoughts you must seek medical attention immediately by calling 911 or calling  your MD immediately  if symptoms less severe.  You Must read complete instructions/literature along with all  the possible adverse reactions/side effects for all the Medicines you take and that have been prescribed to you. Take any new Medicines after you have completely understood and accpet all the possible adverse reactions/side effects.   Do not drive, operating heavy machinery, perform activities at heights, swimming or participation in water activities or provide baby sitting services if your were admitted for syncope or siezures until you have seen by Primary MD or a Neurologist and advised to do so again.  Do not drive when taking Pain medications.    Do not take more than prescribed Pain, Sleep and Anxiety Medications  Special Instructions: If you have smoked or chewed Tobacco  in the last 2 yrs please stop smoking, stop any regular Alcohol  and or any Recreational drug use.  Wear Seat belts while driving.   Please note  You were cared for by a hospitalist during your hospital stay. If you have any questions about your discharge medications or the care you received while you were in the hospital after you are discharged, you can call the unit and asked to speak with the hospitalist on call if the hospitalist that took care of you is not available. Once you are discharged, your primary care physician will handle any further medical issues. Please note that NO REFILLS for any discharge medications will be authorized once you are discharged, as it is imperative that you return to your primary care physician (or establish a relationship with a primary care physician if you do not have one) for your aftercare needs so that they can reassess your need for medications and monitor your lab values.   Increase activity slowly   Complete by: As directed      Allergies as of 10/23/2020      Reactions   Entresto [sacubitril-valsartan] Cough      Medication List    STOP taking these medications   azithromycin 250 MG tablet Commonly known as: Zithromax Z-Pak   potassium chloride 10 MEQ  tablet Commonly known as: KLOR-CON     TAKE these medications   aspirin 81 MG EC tablet Take 1 tablet (81 mg total) by mouth daily.   atorvastatin 20 MG tablet Commonly known as: LIPITOR Take 1 tablet (20 mg total) by mouth daily.   clopidogrel 75 MG tablet Commonly known as: PLAVIX Take 1 tablet (75 mg total) by mouth daily. Start taking on: October 24, 2020   furosemide 40 MG tablet Commonly known as: LASIX Take 0.5 tablets (20 mg total) by mouth daily. What changed: how much to take   Lidocaine 4 % Ptch Commonly known as: HM Lidocaine Patch Apply 1 patch topically every 12 (twelve) hours.   losartan 25 MG tablet Commonly known as: Cozaar Take 1 tablet (25 mg total) by mouth daily. What changed:   medication strength  how much to take   metoprolol succinate 25 MG 24 hr tablet Commonly known as: Toprol XL Take 1 tablet (25 mg total) by mouth daily. What changed:   medication strength  how much to take  additional instructions   nicotine 21 mg/24hr patch Commonly known as: NICODERM CQ - dosed in mg/24 hours Place 1 patch (21 mg total) onto the skin daily. Start taking on: October 24, 2020   spironolactone 25 MG tablet Commonly known as: ALDACTONE Take 1 tablet (25 mg total) by mouth daily.  Follow-up Information    Schnier, Latina Craver, MD. Call today.   Specialties: Vascular Surgery, Cardiology, Radiology, Vascular Surgery Why: call office to make an appointment Contact information: [address] [place] [address] [phone]              Allergies  Allergen Reactions  . Entresto [Sacubitril-Valsartan] Cough    Consultations:  Neurology   Procedures/Studies: CT ANGIO HEAD W OR WO CONTRAST  Result Date: 10/22/2020 CLINICAL DATA:  Increased dizziness for 2 weeks. EXAM: CT ANGIOGRAPHY HEAD AND NECK TECHNIQUE: Multidetector CT imaging of the head and neck was performed using the standard protocol during bolus administration of  intravenous contrast. Multiplanar CT image reconstructions and MIPs were obtained to evaluate the vascular anatomy. Carotid stenosis measurements (when applicable) are obtained utilizing NASCET criteria, using the distal internal carotid diameter as the denominator. CONTRAST:  75mL OMNIPAQUE IOHEXOL 350 MG/ML SOLN COMPARISON:  Head MRI 10/19/2020. Carotid Doppler ultrasound 10/21/2020. FINDINGS: CT HEAD FINDINGS Brain: No acute large territory infarct, intracranial hemorrhage, mass, midline shift, or extra-axial fluid collection is identified. Hypodensities in the cerebral white matter bilaterally are nonspecific but compatible with mild chronic small vessel ischemic disease. Chronic infarcts are again noted in the basal ganglia bilaterally. Small chronic cerebellar infarcts on MRI are not well demonstrated by CT. Mild cerebral atrophy is most notable in the parietal regions. Vascular: Calcified atherosclerosis at the skull base. Skull: No fracture or suspicious osseous lesion. Sinuses: Mild bilateral ethmoid sinus mucosal thickening. Clear mastoid air cells. Prominent cerumen in the external auditory canals. Orbits: Unremarkable. Review of the MIP images confirms the above findings CTA NECK FINDINGS Aortic arch: Standard 3 vessel aortic arch with mild atherosclerotic plaque. No arch vessel origin stenosis. Right carotid system: Patent with a small amount of calcified plaque at the carotid bifurcation. No evidence of stenosis or dissection. Left carotid system: Patent with a small amount of calcified and soft plaque at the carotid bifurcation. No evidence of stenosis or dissection. Vertebral arteries: The vertebral arteries are patent without evidence of stenosis, dissection, or significant atherosclerosis. The left vertebral artery is mildly dominant. Skeleton: Advanced disc degeneration at C5-6 and C6-7 with interbody fusion at the former. Severe multilevel cervical facet arthrosis, asymmetrically greater on the  right. Other neck: No evidence of cervical lymphadenopathy or mass. Upper chest: Moderate centrilobular and paraseptal emphysema. Review of the MIP images confirms the above findings CTA HEAD FINDINGS Anterior circulation: The internal carotid arteries are patent from skull base to carotid termini with atherosclerotic plaque resulting in mild right and mild-to-moderate left cavernous segment stenoses. There is tortuosity of the left greater than right cavernous segments. ACAs and MCAs are patent with mild-to-moderate branch vessel atherosclerotic type irregularity but no evidence of a proximal branch occlusion or significant proximal stenosis. No aneurysm is identified. Posterior circulation: The intracranial vertebral arteries are widely patent to the basilar. Patent left PICA, right AICA, and bilateral SCA origins are visualized. The basilar artery is widely patent. Posterior communicating arteries are diminutive or absent. Both PCAs are patent, and there are moderate right and severe left distal P2 stenoses. No aneurysm is identified. Venous sinuses: Patent. Anatomic variants: None. Review of the MIP images confirms the above findings IMPRESSION: 1. Intracranial atherosclerosis including mild right and mild-to-moderate left cavernous ICA and moderate right and severe left P2 stenoses. 2. Mild cervical carotid atherosclerosis without significant stenosis. 3. Widely patent vertebral arteries. 4. Aortic Atherosclerosis (ICD10-I70.0) and Emphysema (ICD10-J43.9). Electronically Signed   By: Jolaine Click.D.  On: 10/22/2020 13:52   DG Chest 2 View  Result Date: 10/16/2020 CLINICAL DATA:  Dizziness for 3-4 days, chills today, history hypertension, coronary artery disease post MI, CHF, GERD EXAM: CHEST - 2 VIEW COMPARISON:  06/28/2020 FINDINGS: Enlargement of cardiac silhouette. Mediastinal contours and pulmonary vascularity normal. Bleb at LEFT base unchanged. Minimal bibasilar atelectasis. No acute infiltrate,  pleural effusion, or pneumothorax. Osseous structures unremarkable. IMPRESSION: Minimal bibasilar atelectasis without acute infiltrate. Electronically Signed   By: Ulyses Southward M.D.   On: 10/16/2020 17:39   CT ANGIO NECK W OR WO CONTRAST  Result Date: 10/22/2020 CLINICAL DATA:  Increased dizziness for 2 weeks. EXAM: CT ANGIOGRAPHY HEAD AND NECK TECHNIQUE: Multidetector CT imaging of the head and neck was performed using the standard protocol during bolus administration of intravenous contrast. Multiplanar CT image reconstructions and MIPs were obtained to evaluate the vascular anatomy. Carotid stenosis measurements (when applicable) are obtained utilizing NASCET criteria, using the distal internal carotid diameter as the denominator. CONTRAST:  57mL OMNIPAQUE IOHEXOL 350 MG/ML SOLN COMPARISON:  Head MRI 10/19/2020. Carotid Doppler ultrasound 10/21/2020. FINDINGS: CT HEAD FINDINGS Brain: No acute large territory infarct, intracranial hemorrhage, mass, midline shift, or extra-axial fluid collection is identified. Hypodensities in the cerebral white matter bilaterally are nonspecific but compatible with mild chronic small vessel ischemic disease. Chronic infarcts are again noted in the basal ganglia bilaterally. Small chronic cerebellar infarcts on MRI are not well demonstrated by CT. Mild cerebral atrophy is most notable in the parietal regions. Vascular: Calcified atherosclerosis at the skull base. Skull: No fracture or suspicious osseous lesion. Sinuses: Mild bilateral ethmoid sinus mucosal thickening. Clear mastoid air cells. Prominent cerumen in the external auditory canals. Orbits: Unremarkable. Review of the MIP images confirms the above findings CTA NECK FINDINGS Aortic arch: Standard 3 vessel aortic arch with mild atherosclerotic plaque. No arch vessel origin stenosis. Right carotid system: Patent with a small amount of calcified plaque at the carotid bifurcation. No evidence of stenosis or dissection. Left  carotid system: Patent with a small amount of calcified and soft plaque at the carotid bifurcation. No evidence of stenosis or dissection. Vertebral arteries: The vertebral arteries are patent without evidence of stenosis, dissection, or significant atherosclerosis. The left vertebral artery is mildly dominant. Skeleton: Advanced disc degeneration at C5-6 and C6-7 with interbody fusion at the former. Severe multilevel cervical facet arthrosis, asymmetrically greater on the right. Other neck: No evidence of cervical lymphadenopathy or mass. Upper chest: Moderate centrilobular and paraseptal emphysema. Review of the MIP images confirms the above findings CTA HEAD FINDINGS Anterior circulation: The internal carotid arteries are patent from skull base to carotid termini with atherosclerotic plaque resulting in mild right and mild-to-moderate left cavernous segment stenoses. There is tortuosity of the left greater than right cavernous segments. ACAs and MCAs are patent with mild-to-moderate branch vessel atherosclerotic type irregularity but no evidence of a proximal branch occlusion or significant proximal stenosis. No aneurysm is identified. Posterior circulation: The intracranial vertebral arteries are widely patent to the basilar. Patent left PICA, right AICA, and bilateral SCA origins are visualized. The basilar artery is widely patent. Posterior communicating arteries are diminutive or absent. Both PCAs are patent, and there are moderate right and severe left distal P2 stenoses. No aneurysm is identified. Venous sinuses: Patent. Anatomic variants: None. Review of the MIP images confirms the above findings IMPRESSION: 1. Intracranial atherosclerosis including mild right and mild-to-moderate left cavernous ICA and moderate right and severe left P2 stenoses. 2. Mild cervical carotid atherosclerosis  without significant stenosis. 3. Widely patent vertebral arteries. 4. Aortic Atherosclerosis (ICD10-I70.0) and Emphysema  (ICD10-J43.9). Electronically Signed   By: Sebastian Ache M.D.   On: 10/22/2020 13:52   MR BRAIN WO CONTRAST  Result Date: 10/19/2020 CLINICAL DATA:  Dizziness, nonspecific. Additional history provided: Patient reports dizziness, overall not feeling well since being treated Monday, given antibiotic for respiratory infection. Patient reports both legs weak and Aki. EXAM: MRI HEAD WITHOUT CONTRAST TECHNIQUE: Multiplanar, multiecho pulse sequences of the brain and surrounding structures were obtained without intravenous contrast. COMPARISON:  CT 05/27/2020. FINDINGS: Brain: The examination is intermittently motion degraded. Most notably, there is moderate motion degradation of the sagittal T1 weighted sequence, mild-to-moderate motion degradation of the axial T2/FLAIR sequence and moderate to moderately severe motion degradation of the axial T1 weighted sequence. Mild cerebral atrophy. Apparent punctate focus of diffusion weighted hyperintensity within the paramedian right pons (series 5, image 19). This is only well appreciated on the axial diffusion weighted imaging (not well appreciated on the coronal diffusion-weighted imaging). Chronic lacunar infarcts within the bilateral corona radiata/basal ganglia. A small chronic lacunar infarct is also present within the callosum body (series 10, image 33) (series 9, image 13). Background mild multifocal T2/FLAIR hyperintensity within the cerebral white matter, nonspecific but compatible with chronic small vessel ischemic disease. Tiny chronic lacunar infarcts within the bilateral cerebellar hemispheres. No evidence of intracranial mass. No chronic intracranial blood products. No extra-axial fluid collection. No midline shift. Vascular: Expected proximal arterial flow voids. Skull and upper cervical spine: Within the limitations of motion degradation, no focal marrow lesion is identified. Sinuses/Orbits: Visualized orbits show no acute finding. Paranasal sinus mucosal  thickening. Most notably, there is mild mucosal thickening within the bilateral ethmoid and sphenoid sinuses. IMPRESSION: Intermittently motion degraded examination, as described and limiting evaluation. Punctate focus of apparent restricted diffusion within the paramedian right midbrain, appreciated on the axial diffusion-weighted imaging only. This may reflect a punctate acute infarct or image noise artifact. Chronic lacunar infarcts within the bilateral corona radiata/basal ganglia, callosal body and bilateral cerebellar hemispheres. Background mild cerebral atrophy and chronic small vessel ischemic disease. Mild paranasal sinus disease, as described. Electronically Signed   By: Jackey Loge DO   On: 10/19/2020 20:13   US Carotid Bilateral (at [facility] and [facility] only)  Result Date: 10/21/2020 CLINICAL DATA:  Stroke. Hypertension, coronary artery disease, tobacco abuse EXAM: BILATERAL CAROTID DUPLEX ULTRASOUND TECHNIQUE: Wallace Cullens scale imaging, color Doppler and duplex ultrasound were performed of bilateral carotid and vertebral arteries in the neck. COMPARISON:  None. FINDINGS: Criteria: Quantification of carotid stenosis is based on velocity parameters that correlate the residual internal carotid diameter with NASCET-based stenosis levels, using the diameter of the distal internal carotid lumen as the denominator for stenosis measurement. The following velocity measurements were obtained: RIGHT ICA: 110/32 cm/sec CCA: 101/21 cm/sec SYSTOLIC ICA/CCA RATIO:  1.1 ECA: 110 cm/sec LEFT ICA: 98/35 cm/sec CCA: 77/21 cm/sec SYSTOLIC ICA/CCA RATIO:  1.3 ECA: 99 cm/sec RIGHT CAROTID ARTERY: Eccentric calcified plaque in the bulb and ICA origin with only mild stenosis. Normal waveforms and color Doppler signal. Mild tortuosity. RIGHT VERTEBRAL ARTERY:  Normal flow direction and waveform. LEFT CAROTID ARTERY: Mild eccentric plaque in the bulb. No no significant ICA stenosis. Normal waveforms and color Doppler signal. Mild  tortuosity. LEFT VERTEBRAL ARTERY:  Normal flow direction and waveform. Bilateral morphologically unremarkable cervical lymph nodes are identified, none greater than 0.7 cm short axis diameter. IMPRESSION: 1. Bilateral carotid bifurcation plaque resulting in less than 50%  diameter ICA stenosis. 2. Antegrade bilateral vertebral arterial flow. Electronically Signed   By: Corlis Leak  Hassell M.D.   On: 10/21/2020 18:55   US ARTERIAL ABI (SCREENING LOWER EXTREMITY)  Result Date: 10/22/2020 CLINICAL DATA:  Bilateral lower extremity claudication, hypertension, hyperlipidemia, history of tobacco abuse EXAM: NONINVASIVE PHYSIOLOGIC VASCULAR STUDY OF BILATERAL LOWER EXTREMITIES TECHNIQUE: Evaluation of both lower extremities were performed at rest, including calculation of ankle-brachial indices with single level Doppler, pressure and pulse volume recording. COMPARISON:  CT 12/10/2014 FINDINGS: Right ABI:  0.89 Left ABI:  1.01 Right Lower Extremity: Multiphasic posterior tibial arterial waveforms at the ankle, monophasic dorsalis pedis. Left Lower Extremity: Multiphasic posterior tibial arterial waveforms at the ankle, monophasic dorsalis pedis. IMPRESSION: 1. Mild right lower extremity arterial occlusive disease at rest. 2. Normal left ABI. Electronically Signed   By: Corlis Leak  Hassell M.D.   On: 10/22/2020 12:37   ECHOCARDIOGRAM COMPLETE BUBBLE STUDY  Result Date: 10/22/2020    ECHOCARDIOGRAM REPORT   Patient Name:   Brian Porter Date of Exam: 10/22/2020 Medical Rec #:  [id number]      Height:       69.0 in Accession #:    [id number]     Weight:       218.3 lb Date of Birth:  09/03/1965       BSA:          2.144 m Patient Age:    54 years       BP:           114/74 mmHg Patient Gender: M              HR:           91 bpm. Exam Location:  [facility] Procedure: 2D Echo, Cardiac Doppler and Color Doppler Indications:     Stroke 434.91 / I63.9  History:         Patient has prior history of Echocardiogram examinations. CHF,                   CAD; Risk Factors:Hypertension.  Sonographer:     Neysa BonitoChristy Roar Referring Phys:  [id number] AMY N COX Diagnosing Phys: Julien Nordmannimothy Gollan MD IMPRESSIONS  1. Left ventricular ejection fraction, by estimation, is 25 to 30%. The left ventricle has severely decreased function. The left ventricle demonstrates global hypokinesis. The left ventricular internal cavity size was severely dilated. Left ventricular diastolic parameters are consistent with Grade II diastolic dysfunction (pseudonormalization).  2. Right ventricular systolic function is normal. The right ventricular size is normal. Tricuspid regurgitation signal is inadequate for assessing PA pressure.  3. Left atrial size was moderately dilated.  4. The mitral valve is normal in structure. Mild mitral valve regurgitation.  5. The aortic valve was not well visualized. Aortic valve regurgitation is mild. FINDINGS  Left Ventricle: Left ventricular ejection fraction, by estimation, is 25 to 30%. The left ventricle has severely decreased function. The left ventricle demonstrates global hypokinesis. The left ventricular internal cavity size was severely dilated. There is no left ventricular hypertrophy. Left ventricular diastolic parameters are consistent with Grade II diastolic dysfunction (pseudonormalization). Right Ventricle: The right ventricular size is normal. No increase in right ventricular wall thickness. Right ventricular systolic function is normal. Tricuspid regurgitation signal is inadequate for assessing PA pressure. Left Atrium: Left atrial size was moderately dilated. Right Atrium: Right atrial size was normal in size. Pericardium: There is no evidence of pericardial effusion. Mitral Valve: The mitral valve is normal in structure. There is  mild thickening of the mitral valve leaflet(s). Mild mitral valve regurgitation. No evidence of mitral valve stenosis. Tricuspid Valve: The tricuspid valve is normal in structure. Tricuspid valve regurgitation is not  demonstrated. No evidence of tricuspid stenosis. Aortic Valve: The aortic valve was not well visualized. Aortic valve regurgitation is mild. Mild aortic valve sclerosis is present, with no evidence of aortic valve stenosis. Aortic valve peak gradient measures 14.7 mmHg. Pulmonic Valve: The pulmonic valve was normal in structure. Pulmonic valve regurgitation is not visualized. No evidence of pulmonic stenosis. Aorta: The aortic root is normal in size and structure. Venous: The inferior vena cava is normal in size with greater than 50% respiratory variability, suggesting right atrial pressure of 3 mmHg. IAS/Shunts: No atrial level shunt detected by color flow Doppler.  LEFT VENTRICLE PLAX 2D LVIDd:         6.84 cm      Diastology LVIDs:         6.01 cm      LV e' medial:    5.11 cm/s LV PW:         1.10 cm      LV E/e' medial:  17.4 LV IVS:        1.19 cm      LV e' lateral:   6.31 cm/s LVOT diam:     2.10 cm      LV E/e' lateral: 14.1 LVOT Area:     3.46 cm  LV Volumes (MOD) LV vol d, MOD A2C: 190.0 ml LV vol d, MOD A4C: 213.0 ml LV vol s, MOD A2C: 127.0 ml LV vol s, MOD A4C: 161.0 ml LV SV MOD A2C:     63.0 ml LV SV MOD A4C:     213.0 ml LV SV MOD BP:      58.1 ml RIGHT VENTRICLE RV Mid diam:    3.31 cm RV S prime:     13.80 cm/s TAPSE (M-mode): 2.4 cm LEFT ATRIUM             Index       RIGHT ATRIUM           Index LA diam:        4.50 cm 2.10 cm/m  RA Area:     16.70 cm LA Vol (A2C):   85.6 ml 39.92 ml/m RA Volume:   38.90 ml  18.14 ml/m LA Vol (A4C):   81.8 ml 38.15 ml/m LA Biplane Vol: 85.1 ml 39.69 ml/m  AORTIC VALVE                PULMONIC VALVE AV Area (Vmax): 1.33 cm    PV Vmax:        1.05 m/s AV Vmax:        191.50 cm/s PV Peak grad:   4.4 mmHg AV Peak Grad:   14.7 mmHg   RVOT Peak grad: 2 mmHg LVOT Vmax:      73.70 cm/s  AORTA Ao Root diam: 2.80 cm MITRAL VALVE MV Area (PHT): 4.21 cm    SHUNTS MV Decel Time: 180 msec    Systemic Diam: 2.10 cm MV E velocity: 89.10 cm/s MV A velocity: 69.00 cm/s MV  E/A ratio:  1.29 MV A Prime:    6.4 cm/s Julien Nordmann MD Electronically signed by Julien Nordmann MD Signature Date/Time: 10/22/2020/2:22:52 PM    Final     echocardiogram   1. Left ventricular ejection fraction, by estimation, is 25 to 30%. The  left ventricle has  severely decreased function. The left ventricle  demonstrates global hypokinesis. The left ventricular internal cavity size  was severely dilated. Left ventricular  diastolic parameters are consistent with Grade II diastolic dysfunction  (pseudonormalization).  2. Right ventricular systolic function is normal. The right ventricular  size is normal. Tricuspid regurgitation signal is inadequate for assessing  PA pressure.  3. Left atrial size was moderately dilated.  4. The mitral valve is normal in structure. Mild mitral valve  regurgitation.  5. The aortic valve was not well visualized. Aortic valve regurgitation  is mild.   FINDINGS  Left Ventricle: Left ventricular ejection fraction, by estimation, is 25  to 30%. The left ventricle has severely decreased function. The left  ventricle demonstrates global hypokinesis. The left ventricular internal  cavity size was severely dilated.  There is no left ventricular hypertrophy. Left ventricular diastolic  parameters are consistent with Grade II diastolic dysfunction  (pseudonormalization).   Right Ventricle: The right ventricular size is normal. No increase in  right ventricular wall thickness. Right ventricular systolic function is  normal. Tricuspid regurgitation signal is inadequate for assessing PA  pressure.   Left Atrium: Left atrial size was moderately dilated.   Right Atrium: Right atrial size was normal in size.   Pericardium: There is no evidence of pericardial effusion.   Mitral Valve: The mitral valve is normal in structure. There is mild  thickening of the mitral valve leaflet(s). Mild mitral valve  regurgitation. No evidence of mitral valve  stenosis.   Tricuspid Valve: The tricuspid valve is normal in structure. Tricuspid  valve regurgitation is not demonstrated. No evidence of tricuspid  stenosis.   Aortic Valve: The aortic valve was not well visualized. Aortic valve  regurgitation is mild. Mild aortic valve sclerosis is present, with no  evidence of aortic valve stenosis. Aortic valve peak gradient measures  14.7 mmHg.   Pulmonic Valve: The pulmonic valve was normal in structure. Pulmonic valve  regurgitation is not visualized. No evidence of pulmonic stenosis.   Aorta: The aortic root is normal in size and structure.   Venous: The inferior vena cava is normal in size with greater than 50%  respiratory variability, suggesting right atrial pressure of 3 mmHg.   IAS/Shunts: No atrial level shunt detected by color flow Doppler.       Subjective:  -Reports dizziness is little bit better today.  Discharge Exam: Vitals:   10/23/20 0521 10/23/20 0729  BP: 126/75 114/76  Pulse: 64 69  Resp: 16 16  Temp: 98.3 F (36.8 C) 97.8 F (36.6 C)  SpO2: 97% 97%   Vitals:   10/22/20 2039 10/23/20 0007 10/23/20 0521 10/23/20 0729  BP: 133/81 (!) 112/54 126/75 114/76  Pulse: 68 60 64 69  Resp: 16 16 16 16   Temp: 98.4 F (36.9 C) 97.6 F (36.4 C) 98.3 F (36.8 C) 97.8 F (36.6 C)  TempSrc: Oral  Oral Oral  SpO2: 97% 99% 97% 97%  Weight:      Height:        General: Pt is alert, awake, not in acute distress Cardiovascular: RRR, S1/S2 +, no rubs, no gallops Respiratory: CTA bilaterally, no wheezing, no rhonchi Abdominal: Soft, NT, ND, bowel sounds + Extremities: no edema, no cyanosis    The results of significant diagnostics from this hospitalization (including imaging, microbiology, ancillary and laboratory) are listed below for reference.     Microbiology: Recent Results (from the past 240 hour(s))  Resp Panel by RT-PCR (Flu A&B, Covid) Nasopharyngeal Swab  Status: None   Collection Time: 10/16/20   3:31 PM   Specimen: Nasopharyngeal Swab; Nasopharyngeal(NP) swabs in vial transport medium  Result Value Ref Range Status   SARS Coronavirus 2 by RT PCR NEGATIVE NEGATIVE Final    Comment: (NOTE) SARS-CoV-2 target nucleic acids are NOT DETECTED.  The SARS-CoV-2 RNA is generally detectable in upper respiratory specimens during the acute phase of infection. The lowest concentration of SARS-CoV-2 viral copies this assay can detect is 138 copies/mL. A negative result does not preclude SARS-Cov-2 infection and should not be used as the sole basis for treatment or other patient management decisions. A negative result may occur with  improper specimen collection/handling, submission of specimen other than nasopharyngeal swab, presence of viral mutation(s) within the areas targeted by this assay, and inadequate number of viral copies(<138 copies/mL). A negative result must be combined with clinical observations, patient history, and epidemiological information. The expected result is Negative.  Fact Sheet for Patients:  BloggerCourse.com  Fact Sheet for Healthcare Providers:  SeriousBroker.it  This test is no t yet approved or cleared by the [place] FDA and  has been authorized for detection and/or diagnosis of SARS-CoV-2 by FDA under an Emergency Use Authorization (EUA). This EUA will remain  in effect (meaning this test can be used) for the duration of the COVID-19 declaration under Section 564(b)(1) of the Act, 21 U.S.C.section 360bbb-3(b)(1), unless the authorization is terminated  or revoked sooner.       Influenza A by PCR NEGATIVE NEGATIVE Final   Influenza B by PCR NEGATIVE NEGATIVE Final    Comment: (NOTE) The Xpert Xpress SARS-CoV-2/FLU/RSV plus assay is intended as an aid in the diagnosis of influenza from Nasopharyngeal swab specimens and should not be used as a sole basis for treatment. Nasal washings and aspirates  are unacceptable for Xpert Xpress SARS-CoV-2/FLU/RSV testing.  Fact Sheet for Patients: BloggerCourse.com  Fact Sheet for Healthcare Providers: SeriousBroker.it  This test is not yet approved or cleared by the [place] FDA and has been authorized for detection and/or diagnosis of SARS-CoV-2 by FDA under an Emergency Use Authorization (EUA). This EUA will remain in effect (meaning this test can be used) for the duration of the COVID-19 declaration under Section 564(b)(1) of the Act, 21 U.S.C. section 360bbb-3(b)(1), unless the authorization is terminated or revoked.  Performed at [facility], [address] [place], [place] [address]   Resp Panel by RT-PCR (Flu A&B, Covid) Nasopharyngeal Swab     Status: None   Collection Time: 10/21/20  3:33 PM   Specimen: Nasopharyngeal Swab; Nasopharyngeal(NP) swabs in vial transport medium  Result Value Ref Range Status   SARS Coronavirus 2 by RT PCR NEGATIVE NEGATIVE Final    Comment: (NOTE) SARS-CoV-2 target nucleic acids are NOT DETECTED.  The SARS-CoV-2 RNA is generally detectable in upper respiratory specimens during the acute phase of infection. The lowest concentration of SARS-CoV-2 viral copies this assay can detect is 138 copies/mL. A negative result does not preclude SARS-Cov-2 infection and should not be used as the sole basis for treatment or other patient management decisions. A negative result may occur with  improper specimen collection/handling, submission of specimen other than nasopharyngeal swab, presence of viral mutation(s) within the areas targeted by this assay, and inadequate number of viral copies(<138 copies/mL). A negative result must be combined with clinical observations, patient history, and epidemiological information. The expected result is Negative.  Fact Sheet for Patients:  BloggerCourse.com  Fact Sheet for  Healthcare Providers:  SeriousBroker.it  This test is no t yet approved or cleared by the [place] and  has been authorized for detection and/or diagnosis of SARS-CoV-2 by FDA under an Emergency Use Authorization (EUA). This EUA will remain  in effect (meaning this test can be used) for the duration of the COVID-19 declaration under Section 564(b)(1) of the Act, 21 U.S.C.section 360bbb-3(b)(1), unless the authorization is terminated  or revoked sooner.       Influenza A by PCR NEGATIVE NEGATIVE Final   Influenza B by PCR NEGATIVE NEGATIVE Final    Comment: (NOTE) The Xpert Xpress SARS-CoV-2/FLU/RSV plus assay is intended as an aid in the diagnosis of influenza from Nasopharyngeal swab specimens and should not be used as a sole basis for treatment. Nasal washings and aspirates are unacceptable for Xpert Xpress SARS-CoV-2/FLU/RSV testing.  Fact Sheet for Patients: BloggerCourse.com  Fact Sheet for Healthcare Providers: SeriousBroker.it  This test is not yet approved or cleared by the [place] FDA and has been authorized for detection and/or diagnosis of SARS-CoV-2 by FDA under an Emergency Use Authorization (EUA). This EUA will remain in effect (meaning this test can be used) for the duration of the COVID-19 declaration under Section 564(b)(1) of the Act, 21 U.S.C. section 360bbb-3(b)(1), unless the authorization is terminated or revoked.  Performed at [facility], [address] [place], [place] [address]      Labs: BNP (last 3 results) No results for input(s): BNP in the last 8760 hours. Basic Metabolic Panel: Recent Labs  Lab 10/16/20 1518 10/19/20 1807 10/21/20 1533  NA 138 138 139  K 3.9 3.8 4.2  CL 105 106 106  CO2 25 25 25   GLUCOSE 106* 86 91  BUN 15 18 16   CREATININE 0.92 0.70 0.63  CALCIUM 8.8* 9.0 9.0   Liver Function Tests: Recent Labs  Lab  10/19/20 1807 10/21/20 1533  AST 17 15  ALT 19 18  ALKPHOS 52 50  BILITOT 0.7 0.7  PROT 7.1 7.0  ALBUMIN 3.8 3.7   No results for input(s): LIPASE, AMYLASE in the last 168 hours. No results for input(s): AMMONIA in the last 168 hours. CBC: Recent Labs  Lab 10/16/20 1518 10/19/20 1807 10/21/20 1533  WBC 12.5* 14.2* 13.2*  NEUTROABS  --   --  5.9  HGB 15.6 15.6 14.9  HCT 45.2 44.8 43.9  MCV 94.2 92.8 94.4  PLT 365 372 365   Cardiac Enzymes: No results for input(s): CKTOTAL, CKMB, CKMBINDEX, TROPONINI in the last 168 hours. BNP: Invalid input(s): POCBNP CBG: Recent Labs  Lab 10/23/20 0622  GLUCAP 144*   D-Dimer No results for input(s): DDIMER in the last 72 hours. Hgb A1c Recent Labs    10/22/20 0444  HGBA1C 5.9*   Lipid Profile Recent Labs    10/22/20 0444  CHOL 102  HDL 30*  LDLCALC 63  TRIG 46  CHOLHDL 3.4   Thyroid function studies No results for input(s): TSH, T4TOTAL, T3FREE, THYROIDAB in the last 72 hours.  Invalid input(s): FREET3 Anemia work up No results for input(s): VITAMINB12, FOLATE, FERRITIN, TIBC, IRON, RETICCTPCT in the last 72 hours. Urinalysis    Component Value Date/Time   COLORURINE YELLOW (A) 10/21/2020 1533   APPEARANCEUR CLEAR (A) 10/21/2020 1533   LABSPEC 1.013 10/21/2020 1533   PHURINE 7.0 10/21/2020 1533   GLUCOSEU NEGATIVE 10/21/2020 1533   HGBUR NEGATIVE 10/21/2020 1533   BILIRUBINUR NEGATIVE 10/21/2020 1533   KETONESUR NEGATIVE 10/21/2020 1533   PROTEINUR NEGATIVE 10/21/2020 1533  UROBILINOGEN 1.0 12/10/2014 2035   NITRITE NEGATIVE 10/21/2020 1533   LEUKOCYTESUR NEGATIVE 10/21/2020 1533   Sepsis Labs Invalid input(s): PROCALCITONIN,  WBC,  LACTICIDVEN Microbiology Recent Results (from the past 240 hour(s))  Resp Panel by RT-PCR (Flu A&B, Covid) Nasopharyngeal Swab     Status: None   Collection Time: 10/16/20  3:31 PM   Specimen: Nasopharyngeal Swab; Nasopharyngeal(NP) swabs in vial transport medium  Result  Value Ref Range Status   SARS Coronavirus 2 by RT PCR NEGATIVE NEGATIVE Final    Comment: (NOTE) SARS-CoV-2 target nucleic acids are NOT DETECTED.  The SARS-CoV-2 RNA is generally detectable in upper respiratory specimens during the acute phase of infection. The lowest concentration of SARS-CoV-2 viral copies this assay can detect is 138 copies/mL. A negative result does not preclude SARS-Cov-2 infection and should not be used as the sole basis for treatment or other patient management decisions. A negative result may occur with  improper specimen collection/handling, submission of specimen other than nasopharyngeal swab, presence of viral mutation(s) within the areas targeted by this assay, and inadequate number of viral copies(<138 copies/mL). A negative result must be combined with clinical observations, patient history, and epidemiological information. The expected result is Negative.  Fact Sheet for Patients:  BloggerCourse.com  Fact Sheet for Healthcare Providers:  SeriousBroker.it  This test is no t yet approved or cleared by the [place] FDA and  has been authorized for detection and/or diagnosis of SARS-CoV-2 by FDA under an Emergency Use Authorization (EUA). This EUA will remain  in effect (meaning this test can be used) for the duration of the COVID-19 declaration under Section 564(b)(1) of the Act, 21 U.S.C.section 360bbb-3(b)(1), unless the authorization is terminated  or revoked sooner.       Influenza A by PCR NEGATIVE NEGATIVE Final   Influenza B by PCR NEGATIVE NEGATIVE Final    Comment: (NOTE) The Xpert Xpress SARS-CoV-2/FLU/RSV plus assay is intended as an aid in the diagnosis of influenza from Nasopharyngeal swab specimens and should not be used as a sole basis for treatment. Nasal washings and aspirates are unacceptable for Xpert Xpress SARS-CoV-2/FLU/RSV testing.  Fact Sheet for  Patients: BloggerCourse.com  Fact Sheet for Healthcare Providers: SeriousBroker.it  This test is not yet approved or cleared by the [place] FDA and has been authorized for detection and/or diagnosis of SARS-CoV-2 by FDA under an Emergency Use Authorization (EUA). This EUA will remain in effect (meaning this test can be used) for the duration of the COVID-19 declaration under Section 564(b)(1) of the Act, 21 U.S.C. section 360bbb-3(b)(1), unless the authorization is terminated or revoked.  Performed at [facility], [address] [place], [place] [address]   Resp Panel by RT-PCR (Flu A&B, Covid) Nasopharyngeal Swab     Status: None   Collection Time: 10/21/20  3:33 PM   Specimen: Nasopharyngeal Swab; Nasopharyngeal(NP) swabs in vial transport medium  Result Value Ref Range Status   SARS Coronavirus 2 by RT PCR NEGATIVE NEGATIVE Final    Comment: (NOTE) SARS-CoV-2 target nucleic acids are NOT DETECTED.  The SARS-CoV-2 RNA is generally detectable in upper respiratory specimens during the acute phase of infection. The lowest concentration of SARS-CoV-2 viral copies this assay can detect is 138 copies/mL. A negative result does not preclude SARS-Cov-2 infection and should not be used as the sole basis for treatment or other patient management decisions. A negative result may occur with  improper specimen collection/handling, submission of specimen other than nasopharyngeal swab, presence of viral  mutation(s) within the areas targeted by this assay, and inadequate number of viral copies(<138 copies/mL). A negative result must be combined with clinical observations, patient history, and epidemiological information. The expected result is Negative.  Fact Sheet for Patients:  BloggerCourse.com  Fact Sheet for Healthcare Providers:  SeriousBroker.it  This test is no t  yet approved or cleared by the [place] FDA and  has been authorized for detection and/or diagnosis of SARS-CoV-2 by FDA under an Emergency Use Authorization (EUA). This EUA will remain  in effect (meaning this test can be used) for the duration of the COVID-19 declaration under Section 564(b)(1) of the Act, 21 U.S.C.section 360bbb-3(b)(1), unless the authorization is terminated  or revoked sooner.       Influenza A by PCR NEGATIVE NEGATIVE Final   Influenza B by PCR NEGATIVE NEGATIVE Final    Comment: (NOTE) The Xpert Xpress SARS-CoV-2/FLU/RSV plus assay is intended as an aid in the diagnosis of influenza from Nasopharyngeal swab specimens and should not be used as a sole basis for treatment. Nasal washings and aspirates are unacceptable for Xpert Xpress SARS-CoV-2/FLU/RSV testing.  Fact Sheet for Patients: BloggerCourse.com  Fact Sheet for Healthcare Providers: SeriousBroker.it  This test is not yet approved or cleared by the [place] FDA and has been authorized for detection and/or diagnosis of SARS-CoV-2 by FDA under an Emergency Use Authorization (EUA). This EUA will remain in effect (meaning this test can be used) for the duration of the COVID-19 declaration under Section 564(b)(1) of the Act, 21 U.S.C. section 360bbb-3(b)(1), unless the authorization is terminated or revoked.  Performed at [facility], [address], [place], [place] [address]      Time coordinating discharge: Over 30 minutes  SIGNED:   Huey Bienenstock, MD  [facility] 10/23/2020, 11:51 AM Pager   If 7PM-7AM, please contact night-coverage [email or web address] Password [email or web address]

## 2020-10-24 ENCOUNTER — Ambulatory Visit: Payer: Self-pay | Admitting: Family

## 2020-11-03 ENCOUNTER — Encounter: Payer: Self-pay | Admitting: Family

## 2020-11-03 ENCOUNTER — Other Ambulatory Visit: Payer: Self-pay

## 2020-11-03 ENCOUNTER — Ambulatory Visit: Payer: Self-pay | Attending: Family | Admitting: Family

## 2020-11-03 VITALS — BP 127/77 | HR 64 | Resp 20 | Ht 69.0 in | Wt 219.5 lb

## 2020-11-03 DIAGNOSIS — Z7902 Long term (current) use of antithrombotics/antiplatelets: Secondary | ICD-10-CM | POA: Insufficient documentation

## 2020-11-03 DIAGNOSIS — M791 Myalgia, unspecified site: Secondary | ICD-10-CM | POA: Insufficient documentation

## 2020-11-03 DIAGNOSIS — Z87891 Personal history of nicotine dependence: Secondary | ICD-10-CM | POA: Insufficient documentation

## 2020-11-03 DIAGNOSIS — I110 Hypertensive heart disease with heart failure: Secondary | ICD-10-CM | POA: Insufficient documentation

## 2020-11-03 DIAGNOSIS — Z8616 Personal history of COVID-19: Secondary | ICD-10-CM | POA: Insufficient documentation

## 2020-11-03 DIAGNOSIS — Z79899 Other long term (current) drug therapy: Secondary | ICD-10-CM | POA: Insufficient documentation

## 2020-11-03 DIAGNOSIS — Z7901 Long term (current) use of anticoagulants: Secondary | ICD-10-CM | POA: Insufficient documentation

## 2020-11-03 DIAGNOSIS — I10 Essential (primary) hypertension: Secondary | ICD-10-CM

## 2020-11-03 DIAGNOSIS — R42 Dizziness and giddiness: Secondary | ICD-10-CM | POA: Insufficient documentation

## 2020-11-03 DIAGNOSIS — Z7982 Long term (current) use of aspirin: Secondary | ICD-10-CM | POA: Insufficient documentation

## 2020-11-03 DIAGNOSIS — R059 Cough, unspecified: Secondary | ICD-10-CM | POA: Insufficient documentation

## 2020-11-03 DIAGNOSIS — I2510 Atherosclerotic heart disease of native coronary artery without angina pectoris: Secondary | ICD-10-CM | POA: Insufficient documentation

## 2020-11-03 DIAGNOSIS — I5022 Chronic systolic (congestive) heart failure: Secondary | ICD-10-CM | POA: Insufficient documentation

## 2020-11-03 DIAGNOSIS — Z720 Tobacco use: Secondary | ICD-10-CM

## 2020-11-03 DIAGNOSIS — Z8249 Family history of ischemic heart disease and other diseases of the circulatory system: Secondary | ICD-10-CM | POA: Insufficient documentation

## 2020-11-03 MED ORDER — DAPAGLIFLOZIN PROPANEDIOL 10 MG PO TABS: 10.0000 mg | ORAL_TABLET | Freq: Every day | ORAL | 5 refills | Status: DC

## 2020-11-03 NOTE — Patient Instructions (Addendum)
Continue weighing daily and call for an overnight weight gain of > 2 pounds or a weekly weight gain of >5 pounds.   Begin farxiga as 1 tablet daily.

## 2020-11-03 NOTE — Progress Notes (Signed)
Patient ID: Brian Porter, male    DOB: 08-21-1965, 55 y.o.   MRN: [id number]   Brian Porter is a 55 y/o male with a history of obstructive sleep apnea (without CPAP), CAD, HTN, GERD, MI, current tobacco use and chronic heart failure.   Echo report from 10/22/20 reviewed and showed an EF of 25-30% along with mild Brian/AR. Echo report from 05/24/2019 reviewed and showed an EF of 30-35% along with trivial TR/PR. Echo done 10/15/15 and showed an EF of 35% along with moderate Brian/TR and an elevated PA pressure of 48 mm Hg.   Had a cardiac catheterization done 10/16/15 and showed an EF of 15% along with multi-vessel disease. Recommended treatment for cardiomyopathy.   Admitted 10/21/20 due to dizziness. 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 Neurology consult obtained. HF meds adjusted. Discharged after 2 days. Was in the ED 10/19/20 due to dizziness where he was treated and released. Was in the ED 10/16/20 due to dizziness where he was treated and released. Was in the ED 06/28/20 due to dizziness and shortness of breath. Antibiotics started for possible pneumonia. Tested covid + and he was released.   He presents today for a follow-up visit with a chief complaint of intermittent cough. He describes this as having been present for several years with varying levels of severity. He has associated dizziness and myalgias along with this. He denies any difficulty sleeping, abdominal distention, palpitations, pedal edema, chest pain, shortness of breath, fatigue or weight gain.   He says that he rarely gets dizzy now maybe 1-2 times/ week. Entresto gave him a cough.   Past Medical History:  Diagnosis Date  . CAD (coronary artery disease)   . CHF (congestive heart failure) (HCC)   . GERD (gastroesophageal reflux disease)   . Hypertension   . MI, old   . Sleep apnea    Past Surgical History:  Procedure Laterality Date  . CARDIAC CATHETERIZATION Right 10/16/2015   Procedure: Left Heart Cath and Coronary  Angiography;  Surgeon: Laurier Nancy, MD;  Location: [facility] INVASIVE CV LAB;  Service: Cardiovascular;  Laterality: Right;  . CORONARY ANGIOPLASTY WITH STENT PLACEMENT  approx 2 years ago  . HERNIA REPAIR Left 11/29/2016   [facility]  . PARTIAL NEPHRECTOMY Left   . SPLENECTOMY     Family History  Problem Relation Age of Onset  . Heart failure Mother   . Hypertension Mother   . Other Mother        covid pneumonia  . Heart attack Father 13  . Diabetes Father   . Diabetes Mellitus II Brother    Social History   Tobacco Use  . Smoking status: Former Smoker    Packs/day: 0.50    Types: Cigarettes    Quit date: 10/24/2020    Years since quitting: 0.0  . Smokeless tobacco: Never Used  . Tobacco comment: noted that he cut back from 1 pack a day to 1/2 a pack a day  Substance Use Topics  . Alcohol use: No    Alcohol/week: 0.0 standard drinks   Allergies  Allergen Reactions  . Entresto [Sacubitril-Valsartan] Cough    Prior to Admission medications   Medication Sig Start Date End Date Taking? Authorizing Provider  aspirin 81 MG EC tablet Take 1 tablet (81 mg total) by mouth daily. 10/23/20  Yes Elgergawy, Leana Roe, MD  atorvastatin (LIPITOR) 20 MG tablet Take 1 tablet (20 mg total) by mouth daily. 10/23/20  Yes Elgergawy, Leana Roe, MD  clopidogrel (PLAVIX) 75 MG tablet Take 1 tablet (75 mg total) by mouth daily. 10/24/20  Yes Elgergawy, Leana Roe, MD  furosemide (LASIX) 40 MG tablet Take 0.5 tablets (20 mg total) by mouth daily. Patient taking differently: Take 60 mg by mouth daily. 10/23/20  Yes Elgergawy, Leana Roe, MD  Lidocaine (HM LIDOCAINE PATCH) 4 % PTCH Apply 1 patch topically every 12 (twelve) hours. Patient taking differently: Apply 1 patch topically every 12 (twelve) hours as needed. 10/05/20  Yes Iloabachie, Chioma E, NP  losartan (COZAAR) 50 MG tablet Take 25 mg by mouth daily.   Yes [provider]  metoprolol succinate (TOPROL XL) 25 MG 24 hr tablet Take 1 tablet (25 mg  total) by mouth daily. 10/23/20 01/21/21 Yes Elgergawy, Leana Roe, MD  potassium chloride (KLOR-CON) 10 MEQ tablet Take 10 mEq by mouth daily.   Yes [provider]  spironolactone (ALDACTONE) 25 MG tablet Take 1 tablet (25 mg total) by mouth daily. 10/23/20 01/21/21 Yes Elgergawy, Leana Roe, MD    Review of Systems  Constitutional: Negative for appetite change and fatigue.  HENT: Negative for congestion, postnasal drip and sore throat.   Eyes: Negative.   Respiratory: Positive for cough (intermittent). Negative for chest tightness and shortness of breath.   Cardiovascular: Negative for chest pain, palpitations and leg swelling.  Gastrointestinal: Negative for abdominal distention.  Endocrine: Negative.   Genitourinary: Negative.   Musculoskeletal: Positive for myalgias (right leg). Negative for back pain.  Skin: Negative.   Allergic/Immunologic: Negative.   Neurological: Positive for dizziness ("rarely"). Negative for light-headedness.  Hematological: Negative for adenopathy. Does not bruise/bleed easily.  Psychiatric/Behavioral: Negative for dysphoric mood, sleep disturbance (sleeping on 2 pillows) and suicidal ideas. The patient is not nervous/anxious.    Vitals:   11/03/20 0821  BP: 127/77  Pulse: 64  Resp: 20  SpO2: 98%  Weight: 219 lb 8 oz (99.6 kg)  Height: 5\' 9"  (1.753 m)   Wt Readings from Last 3 Encounters:  11/03/20 219 lb 8 oz (99.6 kg)  10/21/20 218 lb 4.1 oz (99 kg)  10/19/20 218 lb (98.9 kg)   Lab Results  Component Value Date   CREATININE 0.63 10/21/2020   CREATININE 0.70 10/19/2020   CREATININE 0.92 10/16/2020    Physical Exam Vitals and nursing note reviewed.  Constitutional:      Appearance: He is well-developed.  HENT:     Head: Normocephalic and atraumatic.  Neck:     Vascular: No JVD.  Cardiovascular:     Rate and Rhythm: Normal rate and regular rhythm.  Pulmonary:     Effort: Pulmonary effort is normal.     Breath sounds: No wheezing or  rales.  Abdominal:     General: There is no distension.     Palpations: Abdomen is soft.     Tenderness: There is no abdominal tenderness.  Musculoskeletal:        General: No tenderness.     Cervical back: Normal range of motion and neck supple.  Skin:    General: Skin is warm and dry.  Neurological:     Mental Status: He is alert and oriented to person, place, and time.  Psychiatric:        Behavior: Behavior normal.        Thought Content: Thought content normal.       Assessment & Plan:   1: Chronic heart failure with reduced ejection fraction- - NYHA class I - euvolemic in appearance  - weighing daily.  Reminded to call for an overnight weight gain of >2 pounds or a weekly weight gain of >5 pounds.  - weight unchanged since he was last here 1 month ago - currently on GDMT of losartan, metoprolol & spironolactone - will add farxiga 10mg  daily; 30 day voucher given to patient; discussed patient assistance forms but he would like to wait until next visit to make sure he can tolerate the medication - will check BMP next visit if not done at PCP's office  - not adding salt to his food and tries to follow a 2000mg  sodium diet - BNP 07/24/2018 was 207.0 - PharmD reconciled medications with the patient  2: HTN- - BP looks good today; meds adjusted during recent admission due to soft BP's - saw provider at [facility] 10/05/20 - reviewed BMP from 10/21/20 which showed sodium 139, potassium 4.2, creatinine 0.63 and GFR >60  3: Tobacco use- - stopped smoking 11 days ago - congratulated on that and continued cessation encouraged    Medication bottles reviewed.   Return in 1 month or sooner for any questions/problems before then.

## 2020-11-03 NOTE — Progress Notes (Addendum)
[place] - PHARMACIST COUNSELING NOTE  ADHERENCE ASSESSMENT  Adherence strategy: pill box   Do you ever forget to take your medication? [] Yes (1) [x] No (0)  Do you ever skip doses due to side effects? [] Yes (1) [x] No (0)  Do you have trouble affording your medicines? [] Yes (1) [x] No (0)  Are you ever unable to pick up your medication due to transportation difficulties? [] Yes (1) [x] No (0)  Do you ever stop taking your medications because you don't believe they are helping? [] Yes (1) [x] No (0)  Total score _0______    Recommendations given to patient about increasing adherence: Pt is adherent with current medication regimen   Guideline-Directed Medical Therapy/Evidence Based Medicine  ACE/ARB/ARNI: losartan  Beta Blocker: metoprolol succinate Aldosterone Antagonist: spironolactone Diuretic: furosemide    SUBJECTIVE  HPI:  Past Medical History:  Diagnosis Date  . CAD (coronary artery disease)   . CHF (congestive heart failure) ([place])   . GERD (gastroesophageal reflux disease)   . Hypertension   . MI, old   . Sleep apnea         OBJECTIVE   Vital signs: HR 64, BP 127/77, weight (pounds) 219 ECHO: Date 05/24/19, EF 30-35% Cath: Date 10/16/15, EF 15%  BMP Latest Ref Rng & Units 10/21/2020 10/19/2020 10/16/2020  Glucose 70 - 99 mg/dL 91 86 106(H)  BUN 6 - 20 mg/dL 16 18 15   Creatinine 0.61 - 1.24 mg/dL 0.63 0.70 0.92  BUN/Creat Ratio 9 - 20 - - -  Sodium 135 - 145 mmol/L 139 138 138  Potassium 3.5 - 5.1 mmol/L 4.2 3.8 3.9  Chloride 98 - 111 mmol/L 106 106 105  CO2 22 - 32 mmol/L 25 25 25   Calcium 8.9 - 10.3 mg/dL 9.0 9.0 8.8(L)    ASSESSMENT 55 yo M presenting to heart failure clinic for follow-up visit. PMH includes sleep apnea, hx of MI, GERD, CHF, and CAD. Pt was recently admitted to the hospital for dizziness and lightheadedness thought to be due to CHF and cardiac medications. No barriers to adherence were  identified during medication reconciliation. Congratulated pt on quitting smoking completely 3 days ago.    PLAN CHF/HTN - Continue furosemide 60 mg daily and KCl 10 mEq daily  ---- Most recent K 4.2 (4/23) - Continue losartan 25 mg daily  - Continue metoprolol succinate 25 mg daily   - Continue spironolactone 25 mg daily  - Start empagliflozin 10 mg daily   Hx of MI/punctate right midbrain infarct  - Continue aspirin 81 mg daily and clopidogrel 75 mg daily - Consider increasing atorvastatin 20 mg daily to 40 mg daily for high-intensity statin therapy  - Continue clopidogrel 75 mg daily - Continue losartan 25 mg daily  - Continue metoprolol succinate 25 mg daily    Pain - Continue using lidocaine patches as needed   Time spent: 15 minutes  Benn Moulder, PharmD Pharmacy Resident  11/03/2020 8:24 AM   Current Outpatient Medications:  .  aspirin 81 MG EC tablet, Take 1 tablet (81 mg total) by mouth daily., Disp: 30 tablet, Rfl: 2 .  atorvastatin (LIPITOR) 20 MG tablet, Take 1 tablet (20 mg total) by mouth daily., Disp: 30 tablet, Rfl: 2 .  clopidogrel (PLAVIX) 75 MG tablet, Take 1 tablet (75 mg total) by mouth daily., Disp: 21 tablet, Rfl: 0 .  furosemide (LASIX) 40 MG tablet, Take 0.5 tablets (20 mg total) by mouth daily., Disp: 30 tablet, Rfl: 1 .  Lidocaine (HM LIDOCAINE PATCH) 4 % PTCH, Apply 1 patch topically every 12 (twelve) hours., Disp: 30 patch, Rfl: 0 .  losartan (COZAAR) 25 MG tablet, Take 1 tablet (25 mg total) by mouth daily., Disp: 30 tablet, Rfl: 2 .  metoprolol succinate (TOPROL XL) 25 MG 24 hr tablet, Take 1 tablet (25 mg total) by mouth daily., Disp: 30 tablet, Rfl: 2 .  nicotine (NICODERM CQ - DOSED IN MG/24 HOURS) 21 mg/24hr patch, Place 1 patch (21 mg total) onto the skin daily., Disp: 28 patch, Rfl: 0 .  spironolactone (ALDACTONE) 25 MG tablet, Take 1 tablet (25 mg total) by mouth daily., Disp: 30 tablet, Rfl: 2   COUNSELING POINTS/CLINICAL PEARLS   Metoprolol Succinate (Goal: 200 mg once daily) Warn patient to avoid activities requiring mental alertness or coordination until drug effects are realized, as drug may cause dizziness. Tell patient planning major surgery with anesthesia to alert physician that drug is being used, as drug impairs ability of heart to respond to reflex adrenergic stimuli. Drug may cause diarrhea, fatigue, headache, or depression. Advise diabetic patient to carefully monitor blood glucose as drug may mask symptoms of hypoglycemia. Patient should take extended-release tablet with or immediately following meals. Counsel patient against sudden discontinuation of drug, as this may precipitate hypertension, angina, or myocardial infarction. In the event of a missed dose, counsel patient to skip the missed dose and maintain a regular dosing schedule. Losartan (Goal: 150 mg once daily)  Warn male patient to avoid pregnancy and to report a pregnancy that occurs during therapy.  Side effects may include dizziness, upper respiratory infection, nasal congestion, and back pain.  Warn patient to avoid use of potassium supplements or potassium-containing salt substitutes unless they consult healthcare provider. Furosemide  Drug causes sun-sensitivity. Advise patient to use sunscreen and avoid tanning beds. Patient should avoid activities requiring coordination until drug effects are realized, as drug may cause dizziness, vertigo, or blurred vision. This drug may cause hyperglycemia, hyperuricemia, constipation, diarrhea, loss of appetite, nausea, vomiting, purpuric disorder, cramps, spasticity, asthenia, headache, paresthesia, or scaling eczema. Instruct patient to report unusual bleeding/bruising or signs/symptoms of hypotension, infection, pancreatitis, or ototoxicity (tinnitus, hearing impairment). Advise patient to report signs/symptoms of a severe skin reactions (flu-like symptoms, spreading red rash, or skin/mucous membrane  blistering) or erythema multiforme. Instruct patient to eat high-potassium foods during drug therapy, as directed by healthcare professional.  Patient should not drink alcohol while taking this drug. Spironolactone  Warn patient to report dehydration, hypotension, or symptoms of worsening renal function.  Counsel male patient to report gynecomastia.  Side effects may include diarrhea, nausea, vomiting, abdominal cramping, fever, leg cramps, lethargy, mental confusion, decreased libido, irregular menses, and rash. Suspension: Tell patient to take drug consistently with respect to food, either before or after a meal.  Advise patient to avoid potassium supplements and foods containing high levels of potassium, including salt substitutes.  DRUGS TO AVOID IN HEART FAILURE  Drug or Class Mechanism  Analgesics . NSAIDs . COX-2 inhibitors . Glucocorticoids  Sodium and water retention, increased systemic vascular resistance, decreased response to diuretics   Diabetes Medications . Metformin . Thiazolidinediones o Rosiglitazone (Avandia) o Pioglitazone (Actos) . DPP4 Inhibitors o Saxagliptin (Onglyza) o Sitagliptin (Januvia)   Lactic acidosis Possible calcium channel blockade   Unknown  Antiarrhythmics . Class I  o Flecainide o Disopyramide . Class III o Sotalol . Other o Dronedarone  Negative inotrope, proarrhythmic   Proarrhythmic, beta blockade  Negative inotrope  Antihypertensives . Alpha  Blockers o Doxazosin . Calcium Channel Blockers o Diltiazem o Verapamil o Nifedipine . Central Alpha Adrenergics o Moxonidine . Peripheral Vasodilators o Minoxidil  Increases renin and aldosterone  Negative inotrope    Possible sympathetic withdrawal  Unknown  Anti-infective . Itraconazole . Amphotericin B  Negative inotrope Unknown  Hematologic . Anagrelide . Cilostazol   Possible inhibition of PD IV Inhibition of PD III causing arrhythmias   Neurologic/Psychiatric . Stimulants . Anti-Seizure Drugs o Carbamazepine o Pregabalin . Antidepressants o Tricyclics o Citalopram . Parkinsons o Bromocriptine o Pergolide o Pramipexole . Antipsychotics o Clozapine . Antimigraine o Ergotamine o Methysergide . Appetite suppressants . Bipolar o Lithium  Peripheral alpha and beta agonist activity  Negative inotrope and chronotrope Calcium channel blockade  Negative inotrope, proarrhythmic Dose-dependent QT prolongation  Excessive serotonin activity/valvular damage Excessive serotonin activity/valvular damage Unknown  IgE mediated hypersensitivy, calcium channel blockade  Excessive serotonin activity/valvular damage Excessive serotonin activity/valvular damage Valvular damage  Direct myofibrillar degeneration, adrenergic stimulation  Antimalarials . Chloroquine . Hydroxychloroquine Intracellular inhibition of lysosomal enzymes  Urologic Agents . Alpha Blockers o Doxazosin o Prazosin o Tamsulosin o Terazosin  Increased renin and aldosterone  Adapted from Page RL, et al. "Drugs That May Cause or Exacerbate Heart Failure: A Scientific Statement from the [place]." Circulation 2016; 329:J18-A41. DOI: 10.1161/CIR.0000000000000426   MEDICATION ADHERENCES TIPS AND STRATEGIES 1. Taking medication as prescribed improves patient outcomes in heart failure (reduces hospitalizations, improves symptoms, increases survival) 2. Side effects of medications can be managed by decreasing doses, switching agents, stopping drugs, or adding additional therapy. Please let someone in the [facility] know if you have having bothersome side effects so we can modify your regimen. Do not alter your medication regimen without talking to [place].  3. Medication reminders can help patients remember to take drugs on time. If you are missing or forgetting doses you can try linking behaviors, using pill boxes, or an  electronic reminder like an alarm on your phone or an app. Some people can also get automated phone calls as medication reminders.

## 2020-11-07 ENCOUNTER — Ambulatory Visit: Payer: Self-pay | Admitting: Family

## 2020-11-07 ENCOUNTER — Other Ambulatory Visit: Payer: Self-pay | Admitting: Gerontology

## 2020-11-07 DIAGNOSIS — I5022 Chronic systolic (congestive) heart failure: Secondary | ICD-10-CM

## 2020-11-13 ENCOUNTER — Other Ambulatory Visit: Payer: Self-pay | Admitting: Family

## 2020-11-15 ENCOUNTER — Other Ambulatory Visit: Payer: Self-pay

## 2020-11-15 DIAGNOSIS — I5022 Chronic systolic (congestive) heart failure: Secondary | ICD-10-CM

## 2020-11-15 DIAGNOSIS — E785 Hyperlipidemia, unspecified: Secondary | ICD-10-CM

## 2020-11-15 DIAGNOSIS — R7303 Prediabetes: Secondary | ICD-10-CM

## 2020-11-16 ENCOUNTER — Encounter: Payer: Self-pay | Admitting: Gerontology

## 2020-11-16 ENCOUNTER — Other Ambulatory Visit: Payer: Self-pay

## 2020-11-16 ENCOUNTER — Ambulatory Visit: Payer: Self-pay | Admitting: Gerontology

## 2020-11-16 VITALS — BP 107/72 | HR 70 | Temp 98.3°F | Resp 16 | Ht 69.0 in | Wt 220.2 lb

## 2020-11-16 DIAGNOSIS — Z09 Encounter for follow-up examination after completed treatment for conditions other than malignant neoplasm: Secondary | ICD-10-CM

## 2020-11-16 DIAGNOSIS — I739 Peripheral vascular disease, unspecified: Secondary | ICD-10-CM

## 2020-11-16 DIAGNOSIS — E785 Hyperlipidemia, unspecified: Secondary | ICD-10-CM

## 2020-11-16 DIAGNOSIS — R7303 Prediabetes: Secondary | ICD-10-CM

## 2020-11-16 LAB — LIPID PANEL
Chol/HDL Ratio: 3.5 ratio (ref 0.0–5.0)
Cholesterol, Total: 114 mg/dL (ref 100–199)
HDL: 33 mg/dL — ABNORMAL LOW (ref >39)
LDL Chol Calc (NIH): 66 mg/dL (ref 0–99)
Triglycerides: 71 mg/dL (ref 0–149)
VLDL Cholesterol Cal: 15 mg/dL (ref 5–40)

## 2020-11-16 LAB — BASIC METABOLIC PANEL
BUN/Creatinine Ratio: 17 (ref 9–20)
BUN: 15 mg/dL (ref 6–24)
CO2: 23 mmol/L (ref 20–29)
Calcium: 8.8 mg/dL (ref 8.7–10.2)
Chloride: 103 mmol/L (ref 96–106)
Creatinine, Ser: 0.89 mg/dL (ref 0.76–1.27)
Glucose: 74 mg/dL (ref 65–99)
Potassium: 4.6 mmol/L (ref 3.5–5.2)
Sodium: 139 mmol/L (ref 134–144)
eGFR: 101 mL/min/{1.73_m2} (ref >59)

## 2020-11-16 LAB — HEMOGLOBIN A1C
Est. average glucose Bld gHb Est-mCnc: 123 mg/dL
Hgb A1c MFr Bld: 5.9 % — ABNORMAL HIGH (ref 4.8–5.6)

## 2020-11-16 MED ORDER — ATORVASTATIN CALCIUM 20 MG PO TABS: 20.0000 mg | ORAL_TABLET | Freq: Every day | ORAL | 2 refills | Status: DC

## 2020-11-16 NOTE — Progress Notes (Signed)
Established Patient Office Visit  Subjective:  Patient ID: Brian Porter, male    DOB: 07-26-1965  Age: 55 y.o. MRN: [id number]  CC:  Chief Complaint  Patient presents with  . Follow-up    Lab results    HPI Brian Porter is a 55 y/o male who has history of CAD, CHF, GERD, Hypertension, MI, presents for routine follow up and lab review. He was discharged from the hospital on 10/23/20 after been treated for dizziness. Currently, he denies any chest pain, palpitation, shortness of breath and dizziness. He reports experiencing mild intermittent claudication while walking, denies erythema, calf tightness and he quit smoking. ABI that was done during his hospitalization was significant for mild peripheral vascular disease and Vascular Surgery follow up was recommended. He deferred Colonoscopy referral this time, stating that he needs time to think about it. His HgbA1c was 5.9% and HDL was  33 mg/dl and he continues to make dietary changes. Overall, he states that he's doing well and offers no further complaint.  Past Medical History:  Diagnosis Date  . CAD (coronary artery disease)   . CHF (congestive heart failure) ([place])   . GERD (gastroesophageal reflux disease)   . Hypertension   . MI, old   . Sleep apnea     Past Surgical History:  Procedure Laterality Date  . CARDIAC CATHETERIZATION Right 10/16/2015   Procedure: Left Heart Cath and Coronary Angiography;  Surgeon: Dionisio Leeroy, MD;  Location: [facility];  Service: Cardiovascular;  Laterality: Right;  . CORONARY ANGIOPLASTY WITH STENT PLACEMENT  approx 2 years ago  . HERNIA REPAIR Left 11/29/2016   [facility]  . PARTIAL NEPHRECTOMY Left   . SPLENECTOMY      Family History  Problem Relation Age of Onset  . Hypertension Mother   . Other Mother        covid pneumonia  . Congestive Heart Failure Mother   . Heart attack Father 55  . Diabetes Father   . Hypertension Father   . Diabetes Mellitus II Brother     Social  History   Socioeconomic History  . Marital status: Single    Spouse name: Not on file  . Number of children: Not on file  . Years of education: Not on file  . Highest education level: Not on file  Occupational History  . Occupation: unemployed  Tobacco Use  . Smoking status: Former Smoker    Packs/day: 0.50    Types: Cigarettes    Quit date: 10/24/2020    Years since quitting: 0.0  . Smokeless tobacco: Never Used  . Tobacco comment: noted that he cut back from 1 pack a day to 1/2 a pack a day  Vaping Use  . Vaping Use: Never used  Substance and Sexual Activity  . Alcohol use: No    Alcohol/week: 0.0 standard drinks  . Drug use: Not Currently    Comment: percocet  . Sexual activity: Not Currently  Other Topics Concern  . Not on file  Social History Narrative  . Not on file   Social Determinants of Health   Financial Resource Strain: Not on file  Food Insecurity: No Food Insecurity  . Worried About Charity fundraiser in the Last Year: Never true  . Ran Out of Food in the Last Year: Never true  Transportation Needs: No Transportation Needs  . Lack of Transportation (Medical): No  . Lack of Transportation (Non-Medical): No  Physical Activity: Not on file  Stress: Not on file  Social Connections: Not on file  Intimate Partner Violence: Not on file    Outpatient Medications Prior to Visit  Medication Sig Dispense Refill  . aspirin 81 MG EC tablet Take 1 tablet (81 mg total) by mouth daily. 30 tablet 2  . dapagliflozin propanediol (FARXIGA) 10 MG TABS tablet Take 1 tablet (10 mg total) by mouth daily before breakfast. 30 tablet 5  . furosemide (LASIX) 40 MG tablet Take 0.5 tablets (20 mg total) by mouth daily. (Patient taking differently: Take 60 mg by mouth daily.) 30 tablet 1  . Lidocaine (HM LIDOCAINE PATCH) 4 % PTCH Apply 1 patch topically every 12 (twelve) hours. (Patient taking differently: Apply 1 patch topically every 12 (twelve) hours as needed.) 30 patch 0  .  losartan (COZAAR) 50 MG tablet TAKE 1 TABLET BY MOUTH ONCE DAILY. DISCONTINUE ENTRESTO 90 tablet 3  . metoprolol succinate (TOPROL XL) 25 MG 24 hr tablet Take 1 tablet (25 mg total) by mouth daily. 30 tablet 2  . potassium chloride (KLOR-CON) 10 MEQ tablet Take 10 mEq by mouth daily.    Marland Kitchen spironolactone (ALDACTONE) 25 MG tablet Take 1 tablet (25 mg total) by mouth daily. 30 tablet 2  . atorvastatin (LIPITOR) 20 MG tablet Take 1 tablet (20 mg total) by mouth daily. 30 tablet 2  . clopidogrel (PLAVIX) 75 MG tablet Take 1 tablet (75 mg total) by mouth daily. 21 tablet 0   No facility-administered medications prior to visit.    Allergies  Allergen Reactions  . Entresto [Sacubitril-Valsartan] Cough    ROS Review of Systems  Constitutional: Negative.   Eyes: Negative.   Respiratory: Negative.   Cardiovascular: Negative.   Skin: Negative.   Neurological: Negative.   Psychiatric/Behavioral: Negative.       Objective:    Physical Exam HENT:     Head: Normocephalic and atraumatic.  Cardiovascular:     Rate and Rhythm: Normal rate and regular rhythm.     Pulses: Normal pulses.     Heart sounds: Normal heart sounds.  Pulmonary:     Effort: Pulmonary effort is normal.     Breath sounds: Normal breath sounds.  Musculoskeletal:        General: Normal range of motion.  Skin:    General: Skin is warm.  Neurological:     General: No focal deficit present.     Mental Status: He is alert and oriented to person, place, and time. Mental status is at baseline.  Psychiatric:        Mood and Affect: Mood normal.        Behavior: Behavior normal.        Thought Content: Thought content normal.        Judgment: Judgment normal.     BP 107/72 (BP Location: Right Arm, Patient Position: Sitting, Cuff Size: Large)   Pulse 70   Temp 98.3 F (36.8 C)   Resp 16   Ht '5\' 9"'  (1.753 m)   Wt 220 lb 3.2 oz (99.9 kg)   SpO2 95%   BMI 32.52 kg/m  Wt Readings from Last 3 Encounters:  11/16/20  220 lb 3.2 oz (99.9 kg)  11/15/20 222 lb 1.6 oz (100.7 kg)  11/03/20 219 lb 8 oz (99.6 kg)   Weight loss encouraged.  Health Maintenance Due  Topic Date Due  . COVID-19 Vaccine (1) Never done  . Hepatitis C Screening  Never done  . TETANUS/TDAP  Never done  There are no preventive care reminders to display for this patient.  Lab Results  Component Value Date   TSH 0.722 07/24/2018   Lab Results  Component Value Date   WBC 13.2 (H) 10/21/2020   HGB 14.9 10/21/2020   HCT 43.9 10/21/2020   MCV 94.4 10/21/2020   PLT 365 10/21/2020   Lab Results  Component Value Date   NA 139 11/15/2020   K 4.6 11/15/2020   CO2 23 11/15/2020   GLUCOSE 74 11/15/2020   BUN 15 11/15/2020   CREATININE 0.89 11/15/2020   BILITOT 0.7 10/21/2020   ALKPHOS 50 10/21/2020   AST 15 10/21/2020   ALT 18 10/21/2020   PROT 7.0 10/21/2020   ALBUMIN 3.7 10/21/2020   CALCIUM 8.8 11/15/2020   ANIONGAP 8 10/21/2020   EGFR 101 11/15/2020   Lab Results  Component Value Date   CHOL 114 11/15/2020   Lab Results  Component Value Date   HDL 33 (L) 11/15/2020   Lab Results  Component Value Date   LDLCALC 66 11/15/2020   Lab Results  Component Value Date   TRIG 71 11/15/2020   Lab Results  Component Value Date   CHOLHDL 3.5 11/15/2020   Lab Results  Component Value Date   HGBA1C 5.9 (H) 11/15/2020      Assessment & Plan:    1. Hospital discharge follow-up - He was encouraged to follow up with discharge summary.  2. Prediabetes - His HgbA1c was 5.9%, he defers Metformin therapy, stating that he will modify his diet. He was encouraged to continue on low carb/non concentrated sweet diet and exercise as tolerated.  3. Intermittent claudication (HCC) - His ABI showed mild PVD, he will follow up with Vascular Surgery. - Ambulatory referral to Vascular Surgery  4. Elevated lipids - His LDL improved, will continue on current medication, advised to continue on low fat/cholesterol diet and  exercise as tolerated. - atorvastatin (LIPITOR) 20 MG tablet; Take 1 tablet (20 mg total) by mouth daily.  Dispense: 30 tablet; Refill: 2    Follow-up: Return in about 13 weeks (around 02/15/2021), or if symptoms worsen or fail to improve.    Corrina Steffensen Jerold Coombe, NP

## 2020-11-16 NOTE — Patient Instructions (Signed)

## 2020-11-23 ENCOUNTER — Other Ambulatory Visit: Payer: Self-pay

## 2020-11-23 ENCOUNTER — Encounter: Payer: Self-pay | Admitting: Family

## 2020-11-23 ENCOUNTER — Encounter: Payer: Self-pay | Admitting: Pharmacist

## 2020-11-23 ENCOUNTER — Ambulatory Visit: Payer: Self-pay | Attending: Family | Admitting: Family

## 2020-11-23 VITALS — BP 113/73 | HR 76 | Resp 18 | Ht 69.0 in | Wt 224.1 lb

## 2020-11-23 DIAGNOSIS — I110 Hypertensive heart disease with heart failure: Secondary | ICD-10-CM | POA: Insufficient documentation

## 2020-11-23 DIAGNOSIS — Z720 Tobacco use: Secondary | ICD-10-CM

## 2020-11-23 DIAGNOSIS — I10 Essential (primary) hypertension: Secondary | ICD-10-CM

## 2020-11-23 DIAGNOSIS — Z8249 Family history of ischemic heart disease and other diseases of the circulatory system: Secondary | ICD-10-CM | POA: Insufficient documentation

## 2020-11-23 DIAGNOSIS — Z955 Presence of coronary angioplasty implant and graft: Secondary | ICD-10-CM | POA: Insufficient documentation

## 2020-11-23 DIAGNOSIS — Z905 Acquired absence of kidney: Secondary | ICD-10-CM | POA: Insufficient documentation

## 2020-11-23 DIAGNOSIS — R42 Dizziness and giddiness: Secondary | ICD-10-CM | POA: Insufficient documentation

## 2020-11-23 DIAGNOSIS — Z87891 Personal history of nicotine dependence: Secondary | ICD-10-CM | POA: Insufficient documentation

## 2020-11-23 DIAGNOSIS — I5022 Chronic systolic (congestive) heart failure: Secondary | ICD-10-CM | POA: Insufficient documentation

## 2020-11-23 DIAGNOSIS — R0602 Shortness of breath: Secondary | ICD-10-CM | POA: Insufficient documentation

## 2020-11-23 DIAGNOSIS — Z7984 Long term (current) use of oral hypoglycemic drugs: Secondary | ICD-10-CM | POA: Insufficient documentation

## 2020-11-23 NOTE — Progress Notes (Addendum)
[place] - PHARMACIST COUNSELING NOTE  Guideline-Directed Medical Therapy/Evidence Based Medicine  ACE/ARB/ARNI:  Losartan 50 mg daily  Beta Blocker: Metoprolol succinate 25 mg daily Aldosterone Antagonist: Spironolactone 25 mg daily Diuretic: Furosemide 60 mg daily SGLT2i: Dapagliflozin 10 mg daily  Adherence Assessment  Do you ever forget to take your medication? [] Yes [x] No  Do you ever skip doses due to side effects? [] Yes [x] No  Do you have trouble affording your medicines? [] Yes [x] No  Are you ever unable to pick up your medication due to transportation difficulties? [] Yes [x] No  Do you ever stop taking your medications because you don't believe they are helping? [] Yes [x] No  Do you check your weight daily? [x] Yes [] No   Adherence strategy: Pt reports he sets his medication bottles out on the counter to remember to take them  Barriers to obtaining medications: None  Vital signs: HR 76, BP 113/73, weight (pounds) 224.2 ECHO: Date 10/22/2020, EF 25-30% (05/24/2019 EF 30-35%), notes LV internal cavity severely dilated  BMP Latest Ref Rng & Units 11/15/2020 10/21/2020 10/19/2020  Glucose 65 - 99 mg/dL 74 91 86  BUN 6 - 24 mg/dL 15 16 18   Creatinine 0.76 - 1.27 mg/dL 0.89 0.63 0.70  BUN/Creat Ratio 9 - 20 17 - -  Sodium 134 - 144 mmol/L 139 139 138  Potassium 3.5 - 5.2 mmol/L 4.6 4.2 3.8  Chloride 96 - 106 mmol/L 103 106 106  CO2 20 - 29 mmol/L 23 25 25   Calcium 8.7 - 10.2 mg/dL 8.8 9.0 9.0    Past Medical History:  Diagnosis Date  . CAD (coronary artery disease)   . CHF (congestive heart failure) ([place])   . GERD (gastroesophageal reflux disease)   . Hypertension   . MI, old   . Sleep apnea     ASSESSMENT 55 year old male who presents to the HF clinic for a follow up visit. Pt held off on patient assistance forms for Farxiga last visit as he wanted to see how he would do on the medication - pt denies any adverse effects  since starting the medication. Pt denies any symptoms of hypertension, but does report rare occurrences of hypotensive symptoms (lightheaded/dizzy). Pt otherwise does not have any complaints at this time.   Recent ED Visit (past 6 months): Date - 10/21/2020, [facility] - dizziness (also had visit 10/16/2020 for lightheadedness  PLAN CHF/HTN -continue furosemide 60 mg daily, losartan 50 mg daily, metoprolol succinate 25 mg daily, spironolactone 25 mg daily, and Farxiga 10 mg daily  -30 day supply of Farxiga sample provided to patient today to hold him over while paperwork for patient assistance is being completed -pt was previously on Entresto however pt reports cough with this medication -consider titration of metoprolol succinate if BP and HR allow -continue daily weight checks   CAD/Hx of MI -continue DAPT (aspirin 81 mg daily and clopidogrel 75 mg daily) -consider increasing atorvastatin 20 mg daily to 40 mg daily for high-intensity statin therapy  -continue losartan and metoprolol succinate as above    Pain -continue lidocaine patches as needed  -avoid use of NSAIDs  Time spent: 10 minutes  Sherilyn Banker, PharmD Pharmacy Resident  11/23/2020 2:07 PM    Current Outpatient Medications:  .  aspirin 81 MG EC tablet, Take 1 tablet (81 mg total) by mouth daily., Disp: 30 tablet, Rfl: 2 .  atorvastatin (LIPITOR) 20 MG tablet, Take 1 tablet (20 mg total) by mouth daily., Disp: 30  tablet, Rfl: 2 .  dapagliflozin propanediol (FARXIGA) 10 MG TABS tablet, Take 1 tablet (10 mg total) by mouth daily before breakfast., Disp: 30 tablet, Rfl: 5 .  furosemide (LASIX) 40 MG tablet, Take 0.5 tablets (20 mg total) by mouth daily. (Patient taking differently: Take 60 mg by mouth daily.), Disp: 30 tablet, Rfl: 1 .  Lidocaine (HM LIDOCAINE PATCH) 4 % PTCH, Apply 1 patch topically every 12 (twelve) hours. (Patient taking differently: Apply 1 patch topically every 12 (twelve) hours as needed.), Disp: 30  patch, Rfl: 0 .  losartan (COZAAR) 50 MG tablet, TAKE 1 TABLET BY MOUTH ONCE DAILY. DISCONTINUE ENTRESTO, Disp: 90 tablet, Rfl: 3 .  metoprolol succinate (TOPROL XL) 25 MG 24 hr tablet, Take 1 tablet (25 mg total) by mouth daily., Disp: 30 tablet, Rfl: 2 .  potassium chloride (KLOR-CON) 10 MEQ tablet, Take 5 mEq by mouth daily., Disp: , Rfl:  .  spironolactone (ALDACTONE) 25 MG tablet, Take 1 tablet (25 mg total) by mouth daily., Disp: 30 tablet, Rfl: 2   COUNSELING POINTS/CLINICAL PEARLS Metoprolol succinate (Goal: 200 mg daily)  Patient should avoid activities requiring mental alertness or coordination until drug effects are realized, as drug may cause dizziness.  This drug may cause bradyarrhythmia, diarrhea, nausea, vomiting, arthralgia, back pain, myalgia, headache, vision disorder, erectile dysfunction, reduced libido, or fatigue.  Instruct patient to report signs/symptoms of adverse cardiovascular effects such as hypotension (especially in elderly patients), arrhythmias, syncope, palpitations, angina, or edema.  Drug may mask symptoms of hypoglycemia. Advise diabetic patients to carefully monitor blood sugar levels.  Patient should take drug with food.  Advise patient against sudden discontinuation of drug. Instruct patient to take a missed dose as soon as possible, but if next dose is in less than 8 h, skip the missed dose.  Tell patient planning major surgery with anesthesia to alert physician that drug is being used, as drug impairs ability of heart to respond to reflex adrenergic stimuli.  Patient should take extended-release tablet with or immediately following meals. Losartan (Goal: 50-150 mg daily) Tell patient to avoid activities requiring coordination until drug effects are realized, as this medicine may cause dizziness.  Advise patient to report lightheadedness or syncope.   Warn male patient to avoid pregnancy and to report a pregnancy that occurs during therapy.  Side  effects may include dizziness, upper respiratory infection, nasal congestion, and back pain.  Warn patient to avoid use of potassium supplements or potassium-containing salt substitutes unless they consult healthcare provider. Furosemide  Drug causes sun-sensitivity. Advise patient to use sunscreen and avoid tanning beds. Patient should avoid activities requiring coordination until drug effects are realized, as drug may cause dizziness, vertigo, or blurred vision. This drug may cause excessive urination, hyperglycemia, hyperuricemia, constipation, diarrhea, loss of appetite, nausea, vomiting, purpuric disorder, cramps, spasticity, asthenia, headache, paresthesia, or scaling eczema. Instruct patient to report unusual bleeding/bruising or signs/symptoms of hypotension, infection, pancreatitis, or ototoxicity (tinnitus, hearing impairment). Advise patient to report signs/symptoms of a severe skin reactions (flu-like symptoms, spreading red rash, or skin/mucous membrane blistering) or erythema multiforme. Instruct patient to eat high-potassium foods during drug therapy, as directed by healthcare professional.  Patient should not drink alcohol while taking this drug. Warn patient to avoid use of nonprescription NSAID products without first discussing it with their healthcare provider. Spironolactone (Goal: 25-50 mg daily)  Warn patient to report dehydration, hypotension, or symptoms of worsening renal function.  Counsel male patient to report gynecomastia.  Side effects may include  diarrhea, nausea, vomiting, abdominal cramping, fever, leg cramps, lethargy, mental confusion, decreased libido (spironolactone), irregular menses (spironolactone), and rash. Suspension: Tell patient to take drug consistently with respect to food, either before or after a meal.  Advise patient to avoid potassium supplements and foods containing high levels of potassium, including salt substitutes. Dapagliflozin (Goal: 10 mg  daily)  Inform patients that the most common adverse reactions are genital mycotic infections, nasopharyngitis (dapagliflozin), and urinary tract infections. Inform patients that hypotension may occur and advise them to contact their healthcare provider if they experience such symptoms. Inform patients that dehydration may increase the risk for hypotension, and to have adequate fluid intake.   DRUGS TO CAUTION IN HEART FAILURE  Drug or Class Mechanism  Analgesics . NSAIDs . COX-2 inhibitors . Glucocorticoids  Sodium and water retention, increased systemic vascular resistance, decreased response to diuretics   Diabetes Medications . Metformin . Thiazolidinediones o Rosiglitazone (Avandia) o Pioglitazone (Actos) . DPP4 Inhibitors o Saxagliptin (Onglyza) o Sitagliptin (Januvia)   Lactic acidosis Possible calcium channel blockade   Unknown  Antiarrhythmics . Class I  o Flecainide o Disopyramide . Class III o Sotalol . Other o Dronedarone  Negative inotrope, proarrhythmic   Proarrhythmic, beta blockade  Negative inotrope  Antihypertensives . Alpha Blockers o Doxazosin . Calcium Channel Blockers o Diltiazem o Verapamil o Nifedipine . Central Alpha Adrenergics o Moxonidine . Peripheral Vasodilators o Minoxidil  Increases renin and aldosterone  Negative inotrope    Possible sympathetic withdrawal  Unknown  Anti-infective . Itraconazole . Amphotericin B  Negative inotrope Unknown  Hematologic . Anagrelide . Cilostazol   Possible inhibition of PD IV Inhibition of PD III causing arrhythmias  Neurologic/Psychiatric . Stimulants . Anti-Seizure Drugs o Carbamazepine o Pregabalin . Antidepressants o Tricyclics o Citalopram . Parkinsons o Bromocriptine o Pergolide o Pramipexole . Antipsychotics o Clozapine . Antimigraine o Ergotamine o Methysergide . Appetite suppressants . Bipolar o Lithium  Peripheral alpha and beta agonist  activity  Negative inotrope and chronotrope Calcium channel blockade  Negative inotrope, proarrhythmic Dose-dependent QT prolongation  Excessive serotonin activity/valvular damage Excessive serotonin activity/valvular damage Unknown  IgE mediated hypersensitivy, calcium channel blockade  Excessive serotonin activity/valvular damage Excessive serotonin activity/valvular damage Valvular damage  Direct myofibrillar degeneration, adrenergic stimulation  Antimalarials . Chloroquine . Hydroxychloroquine Intracellular inhibition of lysosomal enzymes  Urologic Agents . Alpha Blockers o Doxazosin o Prazosin o Tamsulosin o Terazosin  Increased renin and aldosterone  Adapted from Page RL, et al. "Drugs That May Cause or Exacerbate Heart Failure: A Scientific Statement from the [place]." Circulation 2016; 834:H96-Q22. DOI: 10.1161/CIR.0000000000000426   MEDICATION ADHERENCES TIPS AND STRATEGIES 1. Taking medication as prescribed improves patient outcomes in heart failure (reduces hospitalizations, improves symptoms, increases survival) 2. Side effects of medications can be managed by decreasing doses, switching agents, stopping drugs, or adding additional therapy. Please let someone in the [facility] know if you have having bothersome side effects so we can modify your regimen. Do not alter your medication regimen without talking to [place].  3. Medication reminders can help patients remember to take drugs on time. If you are missing or forgetting doses you can try linking behaviors, using pill boxes, or an electronic reminder like an alarm on your phone or an app. Some people can also get automated phone calls as medication reminders.

## 2020-11-23 NOTE — Progress Notes (Signed)
Patient ID: Brian Porter, male    DOB: 06-15-1966, 55 y.o.   MRN: [id number]   Brian Porter is a 55 y/o male with a history of obstructive sleep apnea (without CPAP), CAD, HTN, GERD, MI, current tobacco use and chronic heart failure.   Echo report from 10/22/20 reviewed and showed an EF of 25-30% along with mild Brian/AR. Echo report from 05/24/2019 reviewed and showed an EF of 30-35% along with trivial TR/PR. Echo done 10/15/15 and showed an EF of 35% along with moderate Brian/TR and an elevated PA pressure of 48 mm Hg.   Had a cardiac catheterization done 10/16/15 and showed an EF of 15% along with multi-vessel disease. Recommended treatment for cardiomyopathy.   Admitted 10/21/20 due to dizziness. 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 Neurology consult obtained. HF meds adjusted. Discharged after 2 days. Was in the ED 10/19/20 due to dizziness where he was treated and released. Was in the ED 10/16/20 due to dizziness where he was treated and released. Was in the ED 06/28/20 due to dizziness and shortness of breath. Antibiotics started for possible pneumonia. Tested covid + and he was released.   He presents today for a follow-up visit with a chief complaint of minimal shortness of breath upon moderate exertion. He describes this as chronic in nature having been present for several years. He has associated dizziness and slight weight gain along with this. He denies any difficulty sleeping, abdominal distention, palpitations, pedal edema, chest pain, fatigue or cough.   Past Medical History:  Diagnosis Date  . CAD (coronary artery disease)   . CHF (congestive heart failure) (HCC)   . GERD (gastroesophageal reflux disease)   . Hypertension   . MI, old   . Sleep apnea    Past Surgical History:  Procedure Laterality Date  . CARDIAC CATHETERIZATION Right 10/16/2015   Procedure: Left Heart Cath and Coronary Angiography;  Surgeon: Laurier Nancy, MD;  Location: [facility];  Service:  Cardiovascular;  Laterality: Right;  . CORONARY ANGIOPLASTY WITH STENT PLACEMENT  approx 2 years ago  . HERNIA REPAIR Left 11/29/2016   [facility]  . PARTIAL NEPHRECTOMY Left   . SPLENECTOMY     Family History  Problem Relation Age of Onset  . Hypertension Mother   . Other Mother        covid pneumonia  . Congestive Heart Failure Mother   . Heart attack Father 5  . Diabetes Father   . Hypertension Father   . Diabetes Mellitus II Brother    Social History   Tobacco Use  . Smoking status: Former Smoker    Packs/day: 0.50    Types: Cigarettes    Quit date: 10/24/2020    Years since quitting: 0.0  . Smokeless tobacco: Never Used  . Tobacco comment: noted that he cut back from 1 pack a day to 1/2 a pack a day  Substance Use Topics  . Alcohol use: No    Alcohol/week: 0.0 standard drinks   Allergies  Allergen Reactions  . Entresto [Sacubitril-Valsartan] Cough   Prior to Admission medications   Medication Sig Start Date End Date Taking? Authorizing Provider  aspirin 81 MG EC tablet Take 1 tablet (81 mg total) by mouth daily. 10/23/20  Yes Elgergawy, Leana Roe, MD  atorvastatin (LIPITOR) 20 MG tablet Take 1 tablet (20 mg total) by mouth daily. 11/16/20  Yes Iloabachie, Chioma E, NP  dapagliflozin propanediol (FARXIGA) 10 MG TABS tablet Take 1 tablet (10 mg  total) by mouth daily before breakfast. 11/03/20  Yes Clarisa Kindred A, FNP  furosemide (LASIX) 40 MG tablet Take 0.5 tablets (20 mg total) by mouth daily. Patient taking differently: Take 60 mg by mouth daily. 10/23/20  Yes Elgergawy, Leana Roe, MD  Lidocaine (HM LIDOCAINE PATCH) 4 % PTCH Apply 1 patch topically every 12 (twelve) hours. Patient taking differently: Apply 1 patch topically every 12 (twelve) hours as needed. 10/05/20  Yes Iloabachie, Chioma E, NP  losartan (COZAAR) 50 MG tablet TAKE 1 TABLET BY MOUTH ONCE DAILY. DISCONTINUE ENTRESTO 11/14/20  Yes Clarisa Kindred A, FNP  metoprolol succinate (TOPROL XL) 25 MG 24 hr tablet Take 1  tablet (25 mg total) by mouth daily. 10/23/20 01/21/21 Yes Elgergawy, Leana Roe, MD  potassium chloride (KLOR-CON) 10 MEQ tablet Take 5 mEq by mouth daily.   Yes [provider]  spironolactone (ALDACTONE) 25 MG tablet Take 1 tablet (25 mg total) by mouth daily. 10/23/20 01/21/21 Yes Elgergawy, Leana Roe, MD    Review of Systems  Constitutional: Negative for appetite change and fatigue.  HENT: Negative for congestion, postnasal drip and sore throat.   Eyes: Negative.   Respiratory: Positive for shortness of breath (minimal). Negative for cough and chest tightness.   Cardiovascular: Negative for chest pain, palpitations and leg swelling.  Gastrointestinal: Negative for abdominal distention.  Endocrine: Negative.   Genitourinary: Negative.   Musculoskeletal: Positive for myalgias (right leg). Negative for back pain.  Skin: Negative.   Allergic/Immunologic: Negative.   Neurological: Positive for dizziness ("rarely"). Negative for light-headedness.  Hematological: Negative for adenopathy. Does not bruise/bleed easily.  Psychiatric/Behavioral: Negative for dysphoric mood, sleep disturbance (sleeping on 2 pillows) and suicidal ideas. The patient is not nervous/anxious.    Vitals:   11/23/20 1354  BP: 113/73  Pulse: 76  Resp: 18  SpO2: 98%  Weight: 224 lb 2 oz (101.7 kg)  Height: 5\' 9"  (1.753 m)   Wt Readings from Last 3 Encounters:  11/23/20 224 lb 2 oz (101.7 kg)  11/16/20 220 lb 3.2 oz (99.9 kg)  11/15/20 222 lb 1.6 oz (100.7 kg)   Lab Results  Component Value Date   CREATININE 0.89 11/15/2020   CREATININE 0.63 10/21/2020   CREATININE 0.70 10/19/2020    Physical Exam Vitals and nursing note reviewed.  Constitutional:      Appearance: He is well-developed.  HENT:     Head: Normocephalic and atraumatic.  Neck:     Vascular: No JVD.  Cardiovascular:     Rate and Rhythm: Normal rate and regular rhythm.  Pulmonary:     Effort: Pulmonary effort is normal.     Breath  sounds: No wheezing or rales.  Abdominal:     General: There is no distension.     Palpations: Abdomen is soft.     Tenderness: There is no abdominal tenderness.  Musculoskeletal:        General: No tenderness.     Cervical back: Normal range of motion and neck supple.  Skin:    General: Skin is warm and dry.  Neurological:     Mental Status: He is alert and oriented to person, place, and time.  Psychiatric:        Behavior: Behavior normal.        Thought Content: Thought content normal.       Assessment & Plan:   1: Chronic heart failure with reduced ejection fraction- - NYHA class II - euvolemic in appearance  - weighing daily. Reminded to  call for an overnight weight gain of >2 pounds or a weekly weight gain of >5 pounds.  - weight up 5 pounds since he was last here 3 weeks ago - currently on GDMT of losartan, metoprolol, farxiga & spironolactone - farxiga patient assistance forms filled out today and 1 month samples were provided - not adding salt to his food and tries to follow a 2000mg  sodium diet - BNP 07/24/2018 was 207.0 - PharmD reconciled medications with the patient  2: HTN- - BP looks good today - saw provider at [facility] 11/16/20 - reviewed BMP from 11/15/20 which showed sodium 139, potassium 4.6, creatinine 0.89 and GFR 101  3: Tobacco use- - stopped smoking 1 month ago - congratulated on that and continued cessation encouraged    Patient did not bring his medications nor a list. Each medication was verbally reviewed with the patient and he was encouraged to bring the bottles to every visit to confirm accuracy of list.  Return in 6 months or sooner for any questions/problems before then.

## 2020-11-23 NOTE — Patient Instructions (Signed)
Continue weighing daily and call for an overnight weight gain of > 2 pounds or a weekly weight gain of >5 pounds. 

## 2020-11-26 ENCOUNTER — Encounter: Payer: Self-pay | Admitting: Family

## 2020-12-01 ENCOUNTER — Other Ambulatory Visit: Payer: Self-pay

## 2020-12-01 ENCOUNTER — Other Ambulatory Visit: Payer: Self-pay | Admitting: Family

## 2020-12-02 ENCOUNTER — Other Ambulatory Visit: Payer: Self-pay | Admitting: Family

## 2020-12-02 MED ORDER — METOPROLOL SUCCINATE ER 25 MG PO TB24: 25.0000 mg | ORAL_TABLET | Freq: Every day | ORAL | 3 refills | Status: DC

## 2020-12-04 ENCOUNTER — Other Ambulatory Visit: Payer: Self-pay | Admitting: Family

## 2020-12-04 MED ORDER — POTASSIUM CHLORIDE CRYS ER 10 MEQ PO TBCR: 5.0000 meq | EXTENDED_RELEASE_TABLET | Freq: Every day | ORAL | 3 refills | Status: DC

## 2020-12-04 MED ORDER — METOPROLOL SUCCINATE ER 25 MG PO TB24: 25.0000 mg | ORAL_TABLET | Freq: Every day | ORAL | 3 refills | Status: DC

## 2020-12-14 ENCOUNTER — Other Ambulatory Visit: Payer: Self-pay

## 2020-12-14 ENCOUNTER — Ambulatory Visit (INDEPENDENT_AMBULATORY_CARE_PROVIDER_SITE_OTHER): Payer: Self-pay | Admitting: Vascular Surgery

## 2020-12-14 ENCOUNTER — Encounter: Payer: Self-pay | Admitting: Vascular Surgery

## 2020-12-14 VITALS — BP 106/75 | HR 77 | Temp 98.3°F | Resp 20 | Ht 69.0 in | Wt 223.0 lb

## 2020-12-14 DIAGNOSIS — I739 Peripheral vascular disease, unspecified: Secondary | ICD-10-CM

## 2020-12-14 NOTE — Progress Notes (Signed)
Patient name: Brian Porter MRN: [id number] DOB: Nov 29, 1965 Sex: male  REASON FOR CONSULT: Bilateral leg pain  HPI: Brian LOEBER is a 55 y.o. male, with a several year history of pain in both legs that occurs primarily with walking.  States that over the last 2 years he has noticed some aching and pain in the calf which worsens with walking.  He can walk about half a block before the pain starts.  Both legs are equally affected.  Right leg may be slightly worse than the left.  He is a former smoker quit about a month ago.  He is on aspirin 81 mg once daily.  He is also on a statin.  He has a history of coronary artery disease with previous coronary intervention.     Past Medical History:  Diagnosis Date   CAD (coronary artery disease)    CHF (congestive heart failure) (HCC)    GERD (gastroesophageal reflux disease)    Hypertension    MI, old    Sleep apnea    Past Surgical History:  Procedure Laterality Date   CARDIAC CATHETERIZATION Right 10/16/2015   Procedure: Left Heart Cath and Coronary Angiography;  Surgeon: Laurier Nancy, MD;  Location: [facility];  Service: Cardiovascular;  Laterality: Right;   CORONARY ANGIOPLASTY WITH STENT PLACEMENT  approx 2 years ago   HERNIA REPAIR Left 11/29/2016   [facility]   PARTIAL NEPHRECTOMY Left    SPLENECTOMY      Family History  Problem Relation Age of Onset   Hypertension Mother    Other Mother        covid pneumonia   Congestive Heart Failure Mother    Heart attack Father 55   Diabetes Father    Hypertension Father    Diabetes Mellitus II Brother     SOCIAL HISTORY: Social History   Socioeconomic History   Marital status: Single    Spouse name: Not on file   Number of children: Not on file   Years of education: Not on file   Highest education level: Not on file  Occupational History   Occupation: unemployed  Tobacco Use   Smoking status: Former    Packs/day: 0.50    Pack years: 0.00    Types: Cigarettes     Quit date: 10/24/2020    Years since quitting: 0.1   Smokeless tobacco: Never  Vaping Use   Vaping Use: Never used  Substance and Sexual Activity   Alcohol use: No    Alcohol/week: 0.0 standard drinks   Drug use: Not Currently    Comment: percocet   Sexual activity: Not Currently  Other Topics Concern   Not on file  Social History Narrative   Not on file   Social Determinants of Health   Financial Resource Strain: Not on file  Food Insecurity: No Food Insecurity   Worried About Programme researcher, broadcasting/film/video in the Last Year: Never true   Ran Out of Food in the Last Year: Never true  Transportation Needs: No Transportation Needs   Lack of Transportation (Medical): No   Lack of Transportation (Non-Medical): No  Physical Activity: Not on file  Stress: Not on file  Social Connections: Not on file  Intimate Partner Violence: Not on file    Allergies  Allergen Reactions   Entresto [Sacubitril-Valsartan] Cough    Current Outpatient Medications  Medication Sig Dispense Refill   aspirin 81 MG EC tablet Take 1 tablet (81 mg total)  by mouth daily. 30 tablet 2   atorvastatin (LIPITOR) 20 MG tablet Take 1 tablet (20 mg total) by mouth daily. 30 tablet 2   dapagliflozin propanediol (FARXIGA) 10 MG TABS tablet Take 1 tablet (10 mg total) by mouth daily before breakfast. 30 tablet 5   furosemide (LASIX) 40 MG tablet Take 0.5 tablets (20 mg total) by mouth daily. (Patient taking differently: Take 60 mg by mouth daily.) 30 tablet 1   Lidocaine (HM LIDOCAINE PATCH) 4 % PTCH Apply 1 patch topically every 12 (twelve) hours. (Patient taking differently: Apply 1 patch topically every 12 (twelve) hours as needed.) 30 patch 0   losartan (COZAAR) 50 MG tablet TAKE 1 TABLET BY MOUTH ONCE DAILY. DISCONTINUE ENTRESTO 90 tablet 3   metoprolol succinate (TOPROL XL) 25 MG 24 hr tablet Take 1 tablet (25 mg total) by mouth daily. 90 tablet 3   potassium chloride (KLOR-CON) 10 MEQ tablet Take 0.5 tablets (5 mEq  total) by mouth daily. 45 tablet 3   spironolactone (ALDACTONE) 25 MG tablet Take 1 tablet (25 mg total) by mouth daily. 30 tablet 2   No current facility-administered medications for this visit.    ROS:   General:  No weight loss, Fever, chills  HEENT: No recent headaches, no nasal bleeding, no visual changes, no sore throat  Neurologic: No dizziness, blackouts, seizures. No recent symptoms of stroke or mini- stroke. No recent episodes of slurred speech, or temporary blindness.  Cardiac: No recent episodes of chest pain/pressure, no shortness of breath at rest.  No shortness of breath with exertion.  Denies history of atrial fibrillation or irregular heartbeat  Vascular: No history of rest pain in feet.  No history of claudication.  No history of non-healing ulcer, No history of DVT   Pulmonary: No home oxygen, no productive cough, no hemoptysis,  No asthma or wheezing  Musculoskeletal:  [ ]  Arthritis, [ ]  Low back pain,  [ ]  Joint pain  Hematologic:No history of hypercoagulable state.  No history of easy bleeding.  No history of anemia  Gastrointestinal: No hematochezia or melena,  No gastroesophageal reflux, no trouble swallowing  Urinary: [ ]  chronic Kidney disease, [ ]  on HD - [ ]  MWF or [ ]  TTHS, [ ]  Burning with urination, [ ]  Frequent urination, [ ]  Difficulty urinating;   Skin: No rashes  Psychological: No history of anxiety,  No history of depression   Physical Examination  Vitals:   12/14/20 1314  BP: 106/75  Pulse: 77  Resp: 20  Temp: 98.3 F (36.8 C)  SpO2: 93%  Weight: 223 lb (101.2 kg)  Height: 5\' 9"  (1.753 m)    Body mass index is 32.93 kg/m.  General:  Alert and oriented, no acute distress HEENT: Normal Neck: No JVD Cardiac: Regular Rate and Rhythm Abdomen: Soft, non-tender, non-distended, no mass, no scars Skin: No rash Extremity Pulses:  2+ femoral, 2+ left absent right dorsalis pedis, 2+ left absent right posterior tibial pulses   Musculoskeletal: No deformity or edema  Neurologic: Upper and lower extremity motor 5/5 and symmetric  DATA:  Patient had bilateral ABIs performed in April 2022.  Right side was 0.89 left side was 1.01  ASSESSMENT: Mild peripheral arterial disease.  I am not sure that this is the cause of his leg pain.  It is probably multifactorial musculoskeletal with maybe a mild element of peripheral arterial disease.  He is certainly not at risk of limb loss currently.  I congratulated him on quitting  smoking and emphasized that this would be important long-term.   PLAN: Patient will try to walk for 30 minutes daily.  He will follow-up in 1 year for repeat ABIs or sooner if his symptoms worsen.  He will continue his aspirin and statin.   Fabienne Bruns, MD [facility] Office: [phone]

## 2021-01-03 ENCOUNTER — Telehealth: Payer: Self-pay

## 2021-01-03 NOTE — Telephone Encounter (Signed)
CompletedCommunicated - Returned pt's call after speaking with [employer] program about his application. ExplaiCompletedCommunicated - Returned pt's call after speaking with [employer] program about his application. Explained to pt that his application was missing a consent signature and attestation form. Pt was given the instructions for completion and the number to call.ned to pt that his application was missing a consent signature and attestation form. Pt was given the instructions for completion and the number to call.

## 2021-02-14 ENCOUNTER — Ambulatory Visit: Payer: Self-pay | Admitting: Gerontology

## 2021-02-15 ENCOUNTER — Ambulatory Visit: Payer: Self-pay | Admitting: Gerontology

## 2021-02-20 ENCOUNTER — Ambulatory Visit: Payer: Self-pay | Admitting: Adult Health

## 2021-02-22 ENCOUNTER — Other Ambulatory Visit: Payer: Self-pay

## 2021-02-22 ENCOUNTER — Ambulatory Visit: Payer: Self-pay | Admitting: Gerontology

## 2021-02-22 VITALS — BP 131/77 | HR 72 | Resp 22 | Ht 69.0 in | Wt 228.3 lb

## 2021-02-22 DIAGNOSIS — M791 Myalgia, unspecified site: Secondary | ICD-10-CM | POA: Insufficient documentation

## 2021-02-22 MED ORDER — GABAPENTIN 100 MG PO CAPS: 100.0000 mg | ORAL_CAPSULE | Freq: Three times a day (TID) | ORAL | 0 refills | Status: DC

## 2021-02-22 NOTE — Patient Instructions (Signed)
Muscle Pain, Adult Muscle pain, also called myalgia, is a condition in which a person has pain in one or more muscles in the body. Muscle pain may be mild, moderate, or severe. It may feel sharp, achy, or burning. In most cases, the pain lasts only a shorttime and goes away without treatment. Muscle pain can result from using muscles in a new or different way or after a period of inactivity. It is normal to feel some muscle pain after starting anexercise program. Muscles that have not been used often will be sore at first. What are the causes? This condition is caused by using muscles in a new or different way after a period of inactivity. Other causes may include: Overuse or muscle strain, especially if you are not in shape. This is the most common cause of muscle pain. Injury or bruising. Infectious diseases, including diseases caused by viruses, such as the flu (influenza). Fibromyalgia.This is a long-term, or chronic, condition that causes muscle tenderness, tiredness (fatigue), and headache. Autoimmune or rheumatologic diseases. These are conditions, such as lupus, in which the body's defense system (immunesystem) attacks areas in the body. Certain medicines, including ACE inhibitors and statins. What are the signs or symptoms? The main symptom of this condition is sore or painful muscles, including duringactivity and when stretching. You may also have slight swelling. How is this diagnosed? This condition is diagnosed with a physical exam. Your health care provider will ask questions about your pain and when it began. If you have not had muscle pain for very long, your health care provider may want to wait before doing much testing. If your muscle pain has lasted a long time, tests may be done right away. In some cases, this may include tests to rule out certainconditions or illnesses. How is this treated? Treatment for this condition depends on the cause. Home care is often enough to relieve  muscle pain. Your health care provider may also prescribe NSAIDs, suchas ibuprofen. Follow these instructions at home: Medicines Take over-the-counter and prescription medicines only as told by your health care provider. Ask your health care provider if the medicine prescribed to you requires you to avoid driving or using machinery. Managing pain, swelling, and discomfort     If directed, put ice on the painful area. To do this: Put ice in a plastic bag. Place a towel between your skin and the bag. Leave the ice on for 20 minutes, 2-3 times a day. For the first 2 days of muscle soreness, or if there is swelling: Do not soak in hot baths. Do not use a hot tub, steam room, sauna, heating pad, or other heat source. After 48-72 hours, you may alternate between applying ice and applying heat as told by your health care provider. If directed, apply heat to the affected area as often as told by your health care provider. Use the heat source that your health care provider recommends, such as a moist heat pack or a heating pad. Place a towel between your skin and the heat source. Leave the heat on for 20-30 minutes. Remove the heat if your skin turns bright red. This is especially important if you are unable to feel pain, heat, or cold. You may have a greater risk of getting burned. If you have an injury, raise (elevate) the injured area above the level of your heart while you are sitting or lying down. Activity  If overuse is causing your muscle pain: Slow down your activities until the pain   goes away. Do regular, gentle exercises if you are not usually active. Warm up before exercising. Stretch before and after exercising. This can help lower the risk of muscle pain. Do not continue working out if the pain is severe. Severe pain could mean that you have injured a muscle. Do not lift anything that is heavier than 5-10 lb (2.3-4.5 kg), or the limit that you are told, until your health care  provider says that it is safe. Return to your normal activities as told by your health care provider. Ask your health care provider what activities are safe for you.  General instructions Do not use any products that contain nicotine or tobacco, such as cigarettes, e-cigarettes, and chewing tobacco. These can delay healing. If you need help quitting, ask your health care provider. Keep all follow-up visits as told by your health care provider. This is important. Contact a health care provider if you have: Muscle pain that gets worse and medicines do not help. Muscle pain that lasts longer than 3 days. A rash or fever along with muscle pain. Muscle pain after a tick bite. Muscle pain while working out, even though you are in good physical condition. Redness, soreness, or swelling along with muscle pain. Muscle pain after starting a new medicine or changing the dose of a medicine. Get help right away if you have: Trouble breathing. Trouble swallowing. Muscle pain along with a stiff neck, fever, and vomiting. Severe muscle weakness or you cannot move part of your body. These symptoms may represent a serious problem that is an emergency. Do not wait to see if the symptoms will go away. Get medical help right away. Call your local emergency services (911 in the [place] Do not drive yourself to the hospital. Summary Muscle pain usually lasts only a short time and goes away without treatment. This condition is caused by using muscles in a new or different way after a period of inactivity. If your muscle pain lasts longer than 3 days, tell your health care provider. This information is not intended to replace advice given to you by your health care provider. Make sure you discuss any questions you have with your healthcare provider. Document Revised: 03/26/2019 Document Reviewed: 03/26/2019 Elsevier Patient Education  2022 [employer]  

## 2021-02-22 NOTE — Progress Notes (Signed)
Established Patient Office Visit  Subjective:  Patient ID: Brian Porter, male    DOB: 07-Jun-1966  Age: 55 y.o. MRN: [id number]  CC:  Chief Complaint  Patient presents with   Annual Exam   Leg Pain    Pt describes aching leg, left more than R.    HPI Brian Porter  is a 55 y/o male who has history of CAD, CHF, GERD, Hypertension, MI,  presents for routine follow-up for his bilateral leg pain. He was seen by Vascular Surgery Dr Leata Mouse on 12/14/20.  Dr. Oneida Alar stated that his leg pain is probably multifactorial musculoskeletal which may be a mild element of peripheral arterial disease.  He will continue his aspirin and statin. Currently, he states that he continues to experience myalgia to bilateral calf and posterior area of his left thigh. He denies erythema, swelling, paresthesia and muscle weakness. He states that it hurts while walking and he takes up to 6 tablets of Advil daily with minimal relief. He states that myalgia has worsened. He had CK done on 08/31/20 and it was 68 U/L.  Overall, he states that he is doing well, but concerned about his leg pain.  Past Medical History:  Diagnosis Date   CAD (coronary artery disease)    CHF (congestive heart failure) (HCC)    GERD (gastroesophageal reflux disease)    Hypertension    MI, old    Sleep apnea     Past Surgical History:  Procedure Laterality Date   CARDIAC CATHETERIZATION Right 10/16/2015   Procedure: Left Heart Cath and Coronary Angiography;  Surgeon: Dionisio Teige, MD;  Location: [facility];  Service: Cardiovascular;  Laterality: Right;   CORONARY ANGIOPLASTY WITH STENT PLACEMENT  approx 2 years ago   HERNIA REPAIR Left 11/29/2016   [facility]   PARTIAL NEPHRECTOMY Left    SPLENECTOMY      Family History  Problem Relation Age of Onset   Hypertension Mother    Other Mother        covid pneumonia   Congestive Heart Failure Mother    Heart attack Father 63   Diabetes Father    Hypertension Father    Diabetes  Mellitus II Brother     Social History   Socioeconomic History   Marital status: Single    Spouse name: Not on file   Number of children: Not on file   Years of education: Not on file   Highest education level: Not on file  Occupational History   Occupation: unemployed  Tobacco Use   Smoking status: Former    Packs/day: 0.50    Types: Cigarettes    Quit date: 10/24/2020    Years since quitting: 0.3   Smokeless tobacco: Never  Vaping Use   Vaping Use: Never used  Substance and Sexual Activity   Alcohol use: No    Alcohol/week: 0.0 standard drinks   Drug use: Not Currently    Comment: percocet   Sexual activity: Not Currently  Other Topics Concern   Not on file  Social History Narrative   Not on file   Social Determinants of Health   Financial Resource Strain: Not on file  Food Insecurity: No Food Insecurity   Worried About Charity fundraiser in the Last Year: Never true   Ran Out of Food in the Last Year: Never true  Transportation Needs: No Transportation Needs   Lack of Transportation (Medical): No   Lack of Transportation (Non-Medical): No  Physical Activity: Not on file  Stress: Not on file  Social Connections: Not on file  Intimate Partner Violence: Not on file    Outpatient Medications Prior to Visit  Medication Sig Dispense Refill   aspirin 81 MG EC tablet Take 1 tablet (81 mg total) by mouth daily. 30 tablet 2   furosemide (LASIX) 40 MG tablet Take 0.5 tablets (20 mg total) by mouth daily. (Patient taking differently: Take 60 mg by mouth daily.) 30 tablet 1   Lidocaine (HM LIDOCAINE PATCH) 4 % PTCH Apply 1 patch topically every 12 (twelve) hours. (Patient taking differently: Apply 1 patch topically every 12 (twelve) hours as needed.) 30 patch 0   losartan (COZAAR) 50 MG tablet TAKE 1 TABLET BY MOUTH ONCE DAILY. DISCONTINUE ENTRESTO 90 tablet 3   metoprolol succinate (TOPROL XL) 25 MG 24 hr tablet Take 1 tablet (25 mg total) by mouth daily. 90 tablet 3    potassium chloride (KLOR-CON) 10 MEQ tablet Take 0.5 tablets (5 mEq total) by mouth daily. 45 tablet 3   atorvastatin (LIPITOR) 20 MG tablet Take 1 tablet (20 mg total) by mouth daily. 30 tablet 2   dapagliflozin propanediol (FARXIGA) 10 MG TABS tablet Take 1 tablet (10 mg total) by mouth daily before breakfast. (Patient not taking: Reported on 02/22/2021) 30 tablet 5   spironolactone (ALDACTONE) 25 MG tablet Take 1 tablet (25 mg total) by mouth daily. 30 tablet 2   No facility-administered medications prior to visit.    Allergies  Allergen Reactions   Entresto [Sacubitril-Valsartan] Cough    ROS Review of Systems  Constitutional: Negative.   Respiratory: Negative.    Cardiovascular: Negative.   Musculoskeletal:  Positive for myalgias (to calves and left thigh).  Neurological: Negative.      Objective:    Physical Exam HENT:     Head: Normocephalic and atraumatic.  Cardiovascular:     Rate and Rhythm: Normal rate and regular rhythm.     Pulses: Normal pulses.     Heart sounds: Normal heart sounds.  Pulmonary:     Effort: Pulmonary effort is normal.     Breath sounds: Normal breath sounds.  Musculoskeletal:        General: Tenderness (mild tendernes with palpating the calves and lateral aspect of thigh muscle, no swelling,erythema to calve muscle was observed.) present.  Neurological:     General: No focal deficit present.     Mental Status: He is alert and oriented to person, place, and time. Mental status is at baseline.  Psychiatric:        Mood and Affect: Mood normal.        Behavior: Behavior normal.        Thought Content: Thought content normal.        Judgment: Judgment normal.    BP 131/77 (BP Location: Left Arm, Patient Position: Sitting, Cuff Size: Normal)   Pulse 72   Resp (!) 22   Ht '5\' 9"'  (1.753 m)   Wt 228 lb 4.8 oz (103.6 kg)   SpO2 93%   BMI 33.71 kg/m  Wt Readings from Last 3 Encounters:  02/22/21 228 lb 4.8 oz (103.6 kg)  12/14/20 223 lb  (101.2 kg)  11/23/20 224 lb 2 oz (101.7 kg)   Encouraged weight loss  Health Maintenance Due  Topic Date Due   COVID-19 Vaccine (1) Never done   Pneumococcal Vaccine 41-31 Years old (1 - PCV) Never done   Hepatitis C Screening  Never done  TETANUS/TDAP  Never done   Zoster Vaccines- Shingrix (1 of 2) Never done   INFLUENZA VACCINE  01/29/2021    There are no preventive care reminders to display for this patient.  Lab Results  Component Value Date   TSH 0.722 07/24/2018   Lab Results  Component Value Date   WBC 13.2 (H) 10/21/2020   HGB 14.9 10/21/2020   HCT 43.9 10/21/2020   MCV 94.4 10/21/2020   PLT 365 10/21/2020   Lab Results  Component Value Date   NA 139 11/15/2020   K 4.6 11/15/2020   CO2 23 11/15/2020   GLUCOSE 74 11/15/2020   BUN 15 11/15/2020   CREATININE 0.89 11/15/2020   BILITOT 0.7 10/21/2020   ALKPHOS 50 10/21/2020   AST 15 10/21/2020   ALT 18 10/21/2020   PROT 7.0 10/21/2020   ALBUMIN 3.7 10/21/2020   CALCIUM 8.8 11/15/2020   ANIONGAP 8 10/21/2020   EGFR 101 11/15/2020   Lab Results  Component Value Date   CHOL 114 11/15/2020   Lab Results  Component Value Date   HDL 33 (L) 11/15/2020   Lab Results  Component Value Date   LDLCALC 66 11/15/2020   Lab Results  Component Value Date   TRIG 71 11/15/2020   Lab Results  Component Value Date   CHOLHDL 3.5 11/15/2020   Lab Results  Component Value Date   HGBA1C 5.9 (H) 11/15/2020      Assessment & Plan:    1. Muscle pain - His myalgia is likely not related to statin therapy since his CK level done on 08/31/20 was 68 U/L. He was started on gabapentin 100 mg, was educated on medication side effects and advised to notify clinic. He will follow up with Vascular Surgery Dr Oneida Alar. He was advised to go to the ED for worsening symptoms. - gabapentin (NEURONTIN) 100 MG capsule; Take 1 capsule (100 mg total) by mouth 3 (three) times daily.  Dispense: 30 capsule; Refill: 0    Follow-up:  Return in about 27 days (around 03/21/2021), or if symptoms worsen or fail to improve.    Antoinette Borgwardt Jerold Coombe, NP

## 2021-03-02 ENCOUNTER — Emergency Department
Admission: EM | Admit: 2021-03-02 | Discharge: 2021-03-02 | Disposition: A | Discharge status: Home / Self Care | Payer: Self-pay | Attending: Emergency Medicine | Admitting: Emergency Medicine

## 2021-03-02 ENCOUNTER — Other Ambulatory Visit: Payer: Self-pay

## 2021-03-02 ENCOUNTER — Emergency Department: Payer: Self-pay

## 2021-03-02 ENCOUNTER — Encounter: Payer: Self-pay | Admitting: Emergency Medicine

## 2021-03-02 DIAGNOSIS — Z87891 Personal history of nicotine dependence: Secondary | ICD-10-CM | POA: Insufficient documentation

## 2021-03-02 DIAGNOSIS — Z79899 Other long term (current) drug therapy: Secondary | ICD-10-CM | POA: Insufficient documentation

## 2021-03-02 DIAGNOSIS — Z7982 Long term (current) use of aspirin: Secondary | ICD-10-CM | POA: Insufficient documentation

## 2021-03-02 DIAGNOSIS — M79604 Pain in right leg: Secondary | ICD-10-CM

## 2021-03-02 DIAGNOSIS — M545 Low back pain, unspecified: Secondary | ICD-10-CM | POA: Insufficient documentation

## 2021-03-02 DIAGNOSIS — I2510 Atherosclerotic heart disease of native coronary artery without angina pectoris: Secondary | ICD-10-CM | POA: Insufficient documentation

## 2021-03-02 DIAGNOSIS — M79605 Pain in left leg: Secondary | ICD-10-CM | POA: Insufficient documentation

## 2021-03-02 DIAGNOSIS — I110 Hypertensive heart disease with heart failure: Secondary | ICD-10-CM | POA: Insufficient documentation

## 2021-03-02 DIAGNOSIS — I509 Heart failure, unspecified: Secondary | ICD-10-CM | POA: Insufficient documentation

## 2021-03-02 IMAGING — CR DG LUMBAR SPINE COMPLETE 4+V
1 series · 5 of 5 positions shown · non-contrast
Comparison: Lumbar spine radiographs [DATE]. CT abdomen and
pelvis [DATE].

CLINICAL DATA: Low back pain with bilateral lower extremity
radicular pain.

EXAM:
LUMBAR SPINE - COMPLETE 4+ VIEW

[Series 1: dg lumbar spine complete 4 +v · 0.14mm/px · 5 of 5 slices shown]
[im 1/5]
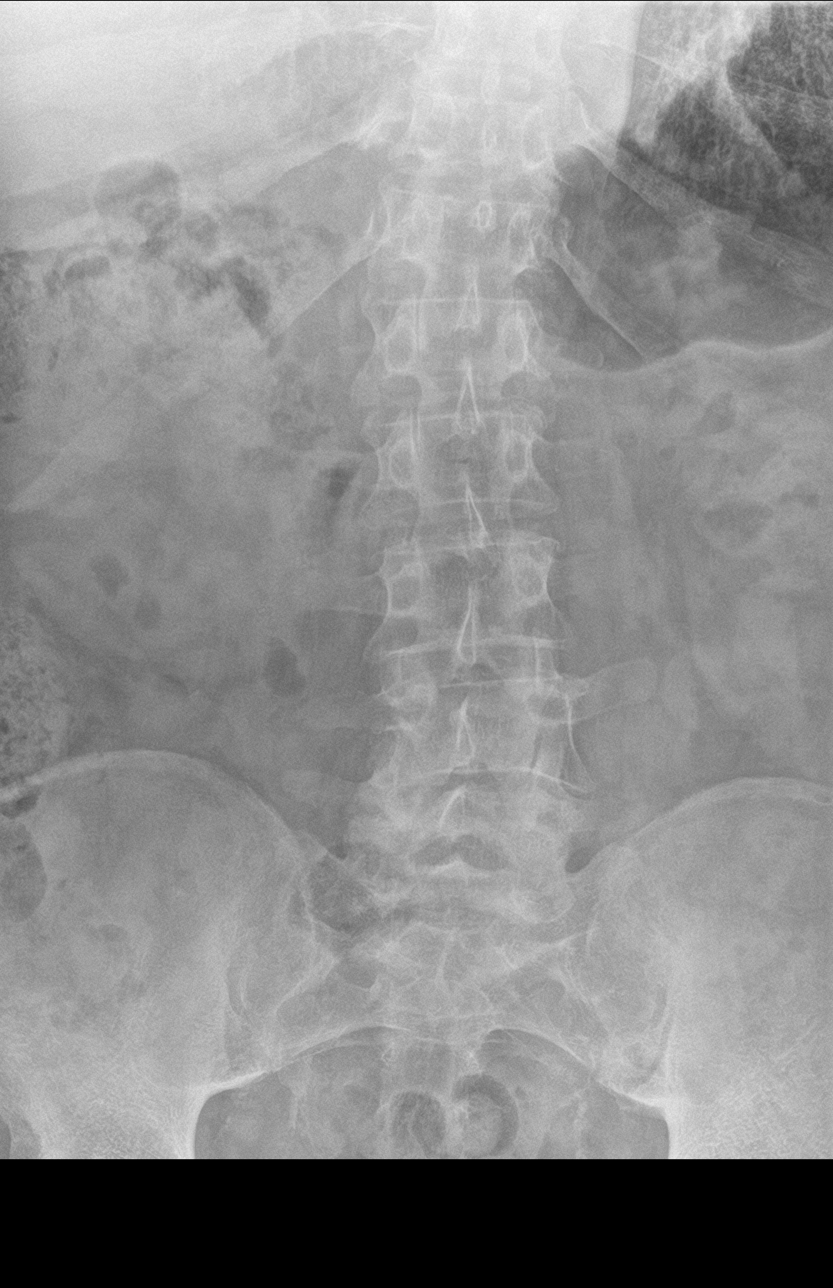
[im 2/5]
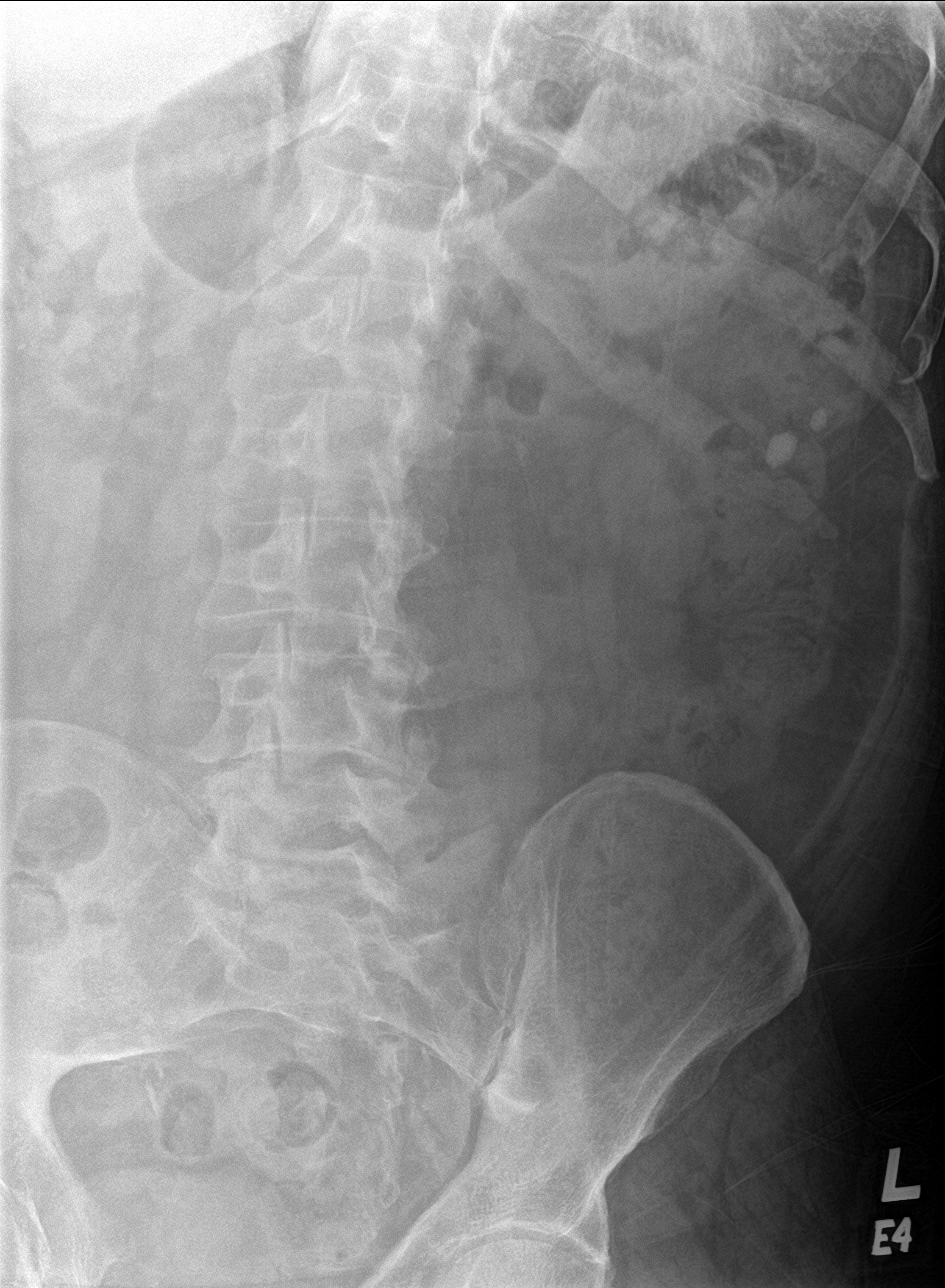
[im 3/5]
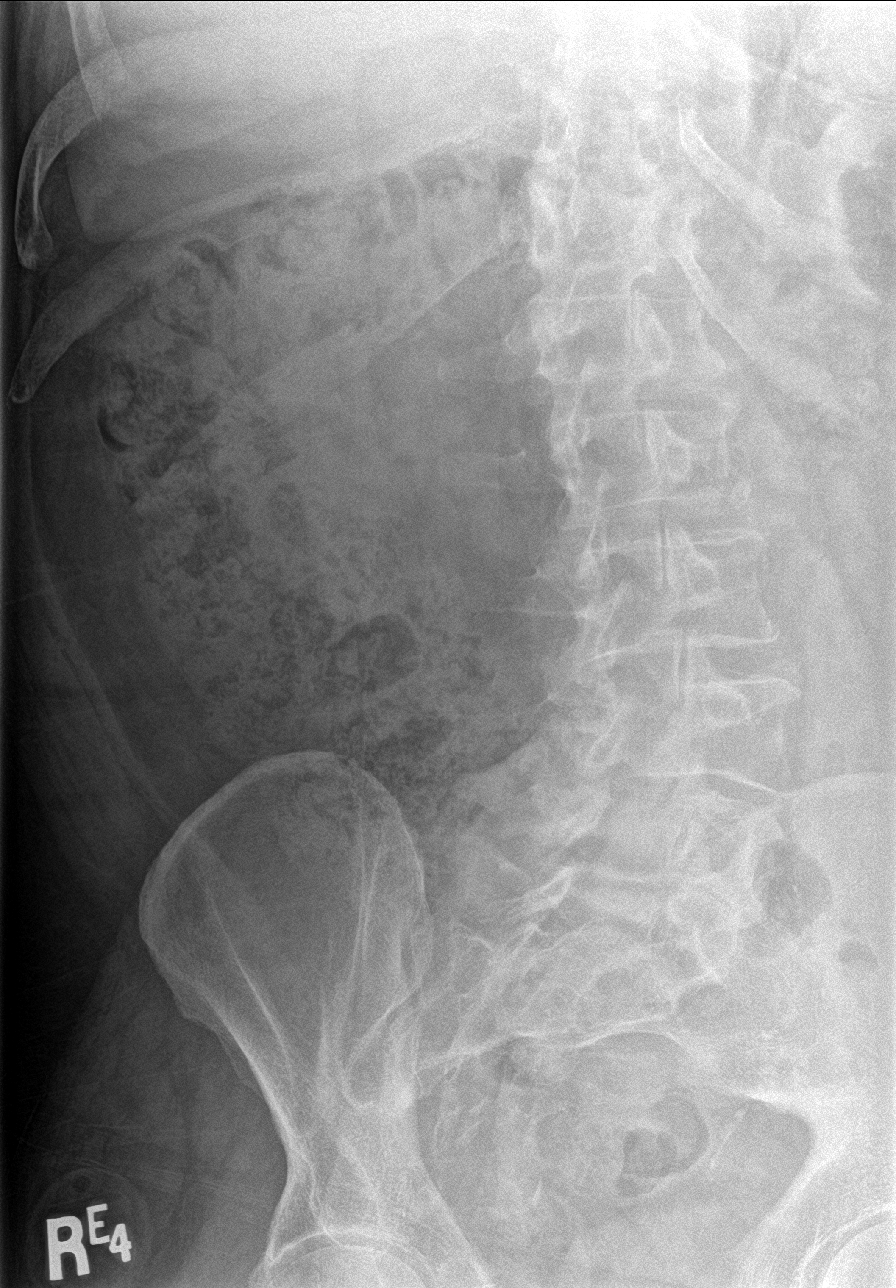
[im 4/5]
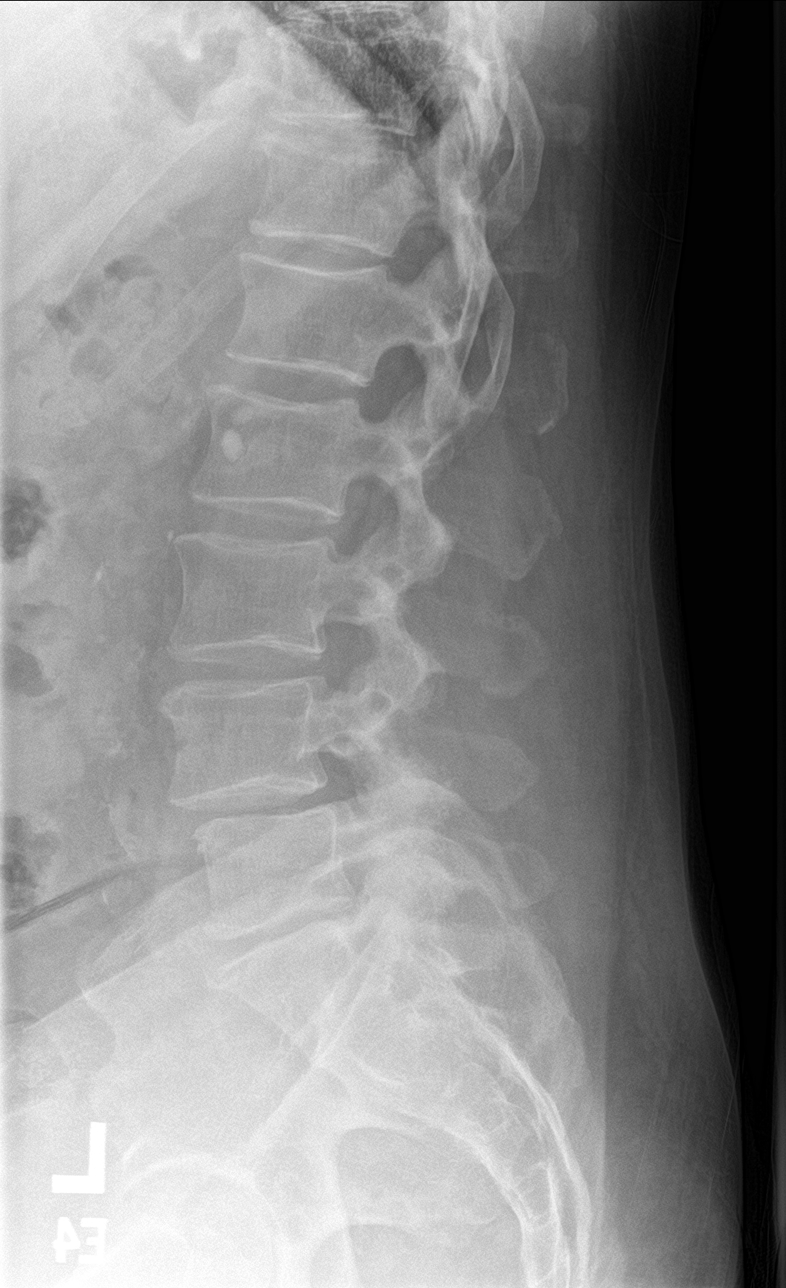
[im 5/5]
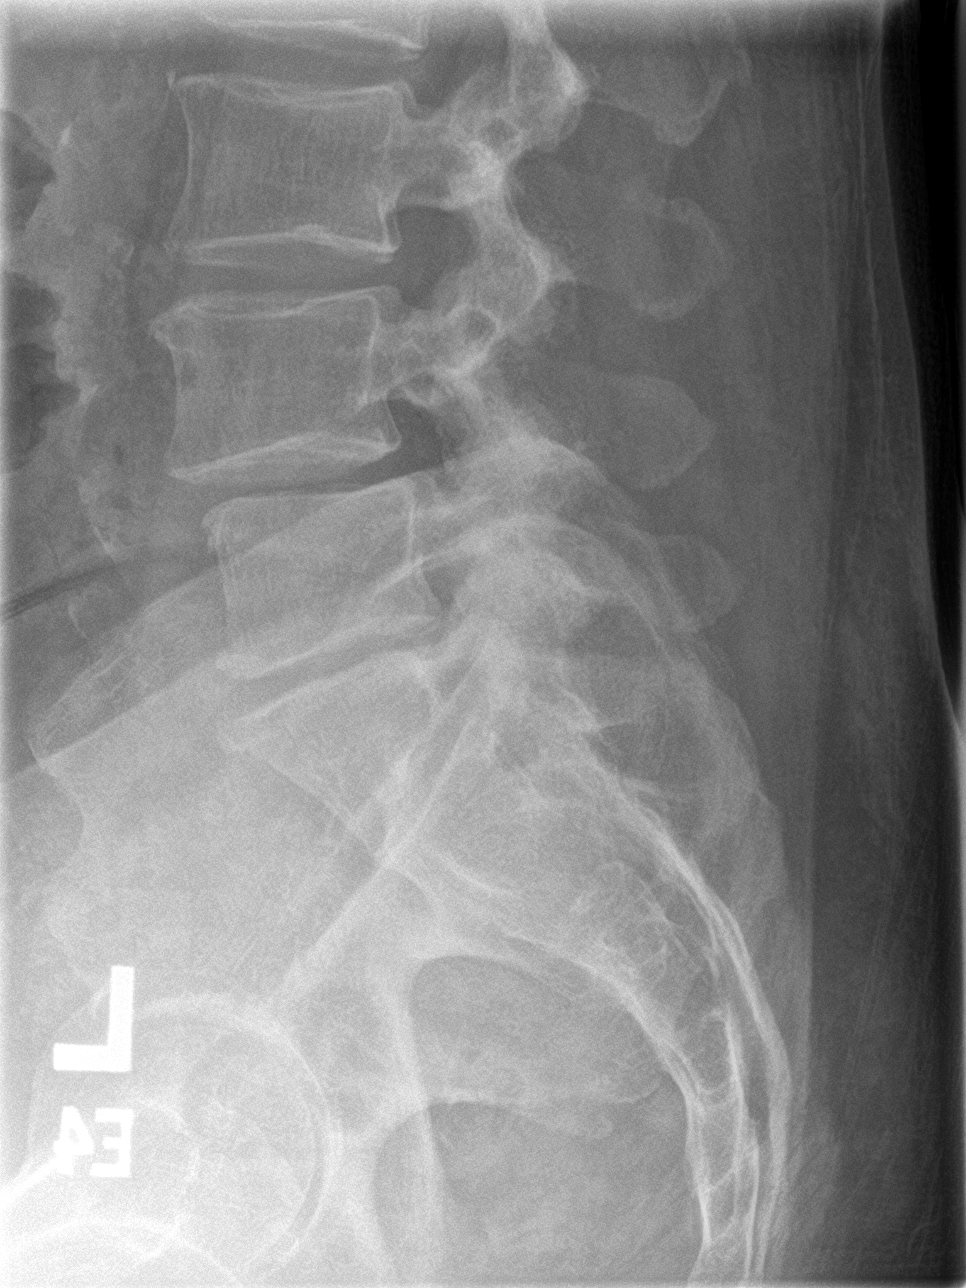

[5 of 5 positions shown; findings below may reference images not displayed]

FINDINGS: There are 5 non rib-bearing lumbar type vertebrae. Slight left
convex curvature of the lower lumbar spine is new from the prior
radiographs but similar to the interval CT. Grade 1 retrolisthesis
of L5 on S1 is unchanged. Grade 1 anterolisthesis of L4 on L5 is new
and likely facet mediated. No pars defect or other fracture is
identified.

Mild-to-moderate disc space narrowing at L4-5 is new, while moderate
narrowing at L5-S1 is similar to the prior CT. There is slight disc
space narrowing at L3-4. Moderate lower lumbar facet arthrosis is
noted. Aortic atherosclerosis and left nephrolithiasis are noted.
IMPRESSION: 1. No evidence of acute osseous abnormality.
2. Progressive lower lumbar disc and facet degeneration with new
grade 1 anterolisthesis at L4-5.

## 2021-03-02 MED ORDER — METHYLPREDNISOLONE 4 MG PO TBPK: ORAL_TABLET | ORAL | 0 refills | Status: DC

## 2021-03-02 MED ORDER — ORPHENADRINE CITRATE ER 100 MG PO TB12: 100.0000 mg | ORAL_TABLET | Freq: Two times a day (BID) | ORAL | 0 refills | Status: DC

## 2021-03-02 NOTE — Discharge Instructions (Addendum)
Read and follow discharge care instruction.  Take medication as directed.  Follow-up with PCP. 

## 2021-03-02 NOTE — ED Provider Notes (Signed)
[facility] Emergency Department Provider Note   ____________________________________________   Event Date/Time   First MD Initiated Contact with Patient 03/02/21 0932     (approximate)  I have reviewed the triage vital signs and the nursing notes.   HISTORY  Chief Complaint Leg Pain    HPI Brian Porter is a 55 y.o. male patient complaining of back pain with radicular component to the bilateral lower extremities.  Patient states pain is worse in the past few weeks.  No provocative incident for complaint.  Patient talk to his PCP and was told  might be side effects of his cholesterol medication.  Patient did not stop taking the medication because he was not advised to stop the medication.  Denies bladder or bowel dysfunction.  Patient pain decreases when he sits down but increases when he stand up and start ambulating.  Denies dyspnea or shortness of breath.         Past Medical History:  Diagnosis Date   CAD (coronary artery disease)    CHF (congestive heart failure) (HCC)    GERD (gastroesophageal reflux disease)    Hypertension    MI, old    Sleep apnea     Patient Active Problem List   Diagnosis Date Noted   Muscle pain 02/22/2021   Hospital discharge follow-up 11/16/2020   Intermittent claudication (HCC) 11/16/2020   CVA (cerebral vascular accident) (HCC) 10/21/2020   Prediabetes 10/05/2020   Leg pain 10/05/2020   CAP (community acquired pneumonia) 07/24/2018   Bilateral recurrent inguinal hernia without obstruction or gangrene    Acute on chronic systolic heart failure (HCC) 08/27/2016   HTN (hypertension) 11/02/2015   Tobacco abuse 11/02/2015   Hand joint pain 11/02/2015   CHF (congestive heart failure) (HCC) 10/15/2015    Past Surgical History:  Procedure Laterality Date   CARDIAC CATHETERIZATION Right 10/16/2015   Procedure: Left Heart Cath and Coronary Angiography;  Surgeon: Laurier Nancy, MD;  Location: [facility];  Service: Cardiovascular;  Laterality: Right;   CORONARY ANGIOPLASTY WITH STENT PLACEMENT  approx 2 years ago   HERNIA REPAIR Left 11/29/2016   [facility]   PARTIAL NEPHRECTOMY Left    SPLENECTOMY      Prior to Admission medications   Medication Sig Start Date End Date Taking? Authorizing Provider  methylPREDNISolone (MEDROL DOSEPAK) 4 MG TBPK tablet Take Tapered dose as directed 03/02/21  Yes Joni Reining, PA-C  orphenadrine (NORFLEX) 100 MG tablet Take 1 tablet (100 mg total) by mouth 2 (two) times daily. 03/02/21  Yes Joni Reining, PA-C  aspirin 81 MG EC tablet Take 1 tablet (81 mg total) by mouth daily. 10/23/20   Elgergawy, Leana Roe, MD  atorvastatin (LIPITOR) 20 MG tablet Take 1 tablet (20 mg total) by mouth daily. 11/16/20   Iloabachie, Chioma E, NP  dapagliflozin propanediol (FARXIGA) 10 MG TABS tablet Take 1 tablet (10 mg total) by mouth daily before breakfast. Patient not taking: Reported on 02/22/2021 11/03/20   Delma Freeze, FNP  furosemide (LASIX) 40 MG tablet Take 0.5 tablets (20 mg total) by mouth daily. Patient taking differently: Take 60 mg by mouth daily. 10/23/20   Elgergawy, Leana Roe, MD  gabapentin (NEURONTIN) 100 MG capsule Take 1 capsule (100 mg total) by mouth 3 (three) times daily. 02/22/21   Iloabachie, Chioma E, NP  Lidocaine (HM LIDOCAINE PATCH) 4 % PTCH Apply 1 patch topically every 12 (twelve) hours. Patient taking differently: Apply 1 patch topically  every 12 (twelve) hours as needed. 10/05/20   Iloabachie, Chioma E, NP  losartan (COZAAR) 50 MG tablet TAKE 1 TABLET BY MOUTH ONCE DAILY. DISCONTINUE ENTRESTO 11/14/20   Clarisa Kindred A, FNP  metoprolol succinate (TOPROL XL) 25 MG 24 hr tablet Take 1 tablet (25 mg total) by mouth daily. 12/04/20 03/04/21  Delma Freeze, FNP  potassium chloride (KLOR-CON) 10 MEQ tablet Take 0.5 tablets (5 mEq total) by mouth daily. 12/04/20   Delma Freeze, FNP  spironolactone (ALDACTONE) 25 MG tablet Take 1 tablet (25 mg total) by mouth  daily. 10/23/20 01/21/21  Elgergawy, Leana Roe, MD    Allergies Sherryll Burger [sacubitril-valsartan]  Family History  Problem Relation Age of Onset   Hypertension Mother    Other Mother        covid pneumonia   Congestive Heart Failure Mother    Heart attack Father 28   Diabetes Father    Hypertension Father    Diabetes Mellitus II Brother     Social History Social History   Tobacco Use   Smoking status: Former    Packs/day: 0.50    Types: Cigarettes    Quit date: 10/24/2020    Years since quitting: 0.3   Smokeless tobacco: Never  Vaping Use   Vaping Use: Never used  Substance Use Topics   Alcohol use: No    Alcohol/week: 0.0 standard drinks   Drug use: Not Currently    Comment: percocet    Review of Systems Constitutional: No fever/chills Eyes: No visual changes. ENT: No sore throat. Cardiovascular: Denies chest pain.  History of CAD and CHF. Respiratory: Denies shortness of breath. Gastrointestinal: No abdominal pain.  No nausea, no vomiting.  No diarrhea.  No constipation.  GERD. Genitourinary: Negative for dysuria. Musculoskeletal: Negative for back pain. Skin: Negative for rash. Neurological: Negative for headaches, focal weakness or numbness. Endocrine: Hypertension. Allergic/Immunilogical: Sherryll Burger.  ____________________________________________   PHYSICAL EXAM:  VITAL SIGNS: ED Triage Vitals  Enc Vitals Group     BP 03/02/21 0918 117/73     Pulse Rate 03/02/21 0918 67     Resp 03/02/21 0918 20     Temp 03/02/21 0918 98 F (36.7 C)     Temp Source 03/02/21 0918 Oral     SpO2 03/02/21 0918 97 %     Weight 03/02/21 0918 228 lb (103.4 kg)     Height 03/02/21 0918 5\' 9"  (1.753 m)     Head Circumference --      Peak Flow --      Pain Score 03/02/21 0923 7     Pain Loc --      Pain Edu? --      Excl. in GC? --     Constitutional: Alert and oriented. Well appearing and in no acute distress. Neck: No stridor.  No cervical spine tenderness to  palpation. Cardiovascular: Normal rate, regular rhythm. Grossly normal heart sounds.  Good peripheral circulation. Respiratory: Normal respiratory effort.  No retractions. Lungs CTAB. Gastrointestinal: Soft and nontender. No distention. No abdominal bruits. No CVA tenderness. Genitourinary: Deferred Musculoskeletal: No lower extremity tenderness nor edema.  No joint effusions. Neurologic:  Normal speech and language. No gross focal neurologic deficits are appreciated. No gait instability. Skin:  Skin is warm, dry and intact. No rash noted. Psychiatric: Mood and affect are normal. Speech and behavior are normal.  ____________________________________________   LABS (all labs ordered are listed, but only abnormal results are displayed)  Labs Reviewed - No data to display  ____________________________________________  EKG   ____________________________________________  RADIOLOGY Margarite Gouge, personally viewed and evaluated these images (plain radiographs) as part of my medical decision making, as well as reviewing the written report by the radiologist.  ED MD interpretation: Degenerative changes lumbar spine.  Official radiology report(s): DG Lumbar Spine Complete  Result Date: 03/02/2021 CLINICAL DATA:  Low back pain with bilateral lower extremity radicular pain. EXAM: LUMBAR SPINE - COMPLETE 4+ VIEW COMPARISON:  Lumbar spine radiographs 04/10/2011. CT abdomen and pelvis 12/10/2014. FINDINGS: There are 5 non rib-bearing lumbar type vertebrae. Slight left convex curvature of the lower lumbar spine is new from the prior radiographs but similar to the interval CT. Grade 1 retrolisthesis of L5 on S1 is unchanged. Grade 1 anterolisthesis of L4 on L5 is new and likely facet mediated. No pars defect or other fracture is identified. Mild-to-moderate disc space narrowing at L4-5 is new, while moderate narrowing at L5-S1 is similar to the prior CT. There is slight disc space narrowing at L3-4.  Moderate lower lumbar facet arthrosis is noted. Aortic atherosclerosis and left nephrolithiasis are noted. IMPRESSION: 1. No evidence of acute osseous abnormality. 2. Progressive lower lumbar disc and facet degeneration with new grade 1 anterolisthesis at L4-5. Electronically Signed   By: Sebastian Ache M.D.   On: 03/02/2021 10:45    ____________________________________________   PROCEDURES  Procedure(s) performed (including Critical Care):  Procedures   ____________________________________________   INITIAL IMPRESSION / ASSESSMENT AND PLAN / ED COURSE  As part of my medical decision making, I reviewed the following data within the electronic MEDICAL RECORD NUMBER         Patient presents for back pain physical component to the bilateral legs over the past few weeks.  Discussed x-ray findings consistent with degenerative changes.  Advised patient if he believes that his pain is coming from simvastatin he can discontinue for 2 weeks and see that this complaint improves.  Advised patient to notify his doctor if he does decide to discontinue medication.  Patient given a prescription for Medrol Dosepak and Norflex.      ____________________________________________   FINAL CLINICAL IMPRESSION(S) / ED DIAGNOSES  Final diagnoses:  Bilateral leg pain     ED Discharge Orders          Ordered    methylPREDNISolone (MEDROL DOSEPAK) 4 MG TBPK tablet        03/02/21 1108    orphenadrine (NORFLEX) 100 MG tablet  2 times daily        03/02/21 1108             Note:  This document was prepared using Dragon voice recognition software and may include unintentional dictation errors.    Joni Reining, PA-C 03/02/21 1113    Chesley Noon, MD 03/02/21 [phone]

## 2021-03-02 NOTE — ED Notes (Signed)
See triage note  Presents with bilateral leg pain  States pain starts in lower back and moves into both thighs  Describes as "muscle "pain  Ambulates well

## 2021-03-02 NOTE — ED Triage Notes (Signed)
Pt reports that he has been having bilat leg pain that has gotten worse over the past few weeks. No known injury. He states that if he sits down it feels better but when he gets up it shoots both legs. He went to his PMD last week and she told him it may be a side effect to his cholesterol medication. Pt has no other complaints. Is able to ambulate without difficulty.

## 2021-03-06 ENCOUNTER — Encounter: Payer: Self-pay | Admitting: Adult Health

## 2021-03-21 ENCOUNTER — Encounter: Payer: Self-pay | Admitting: Gerontology

## 2021-03-21 ENCOUNTER — Ambulatory Visit: Payer: Self-pay | Admitting: Gerontology

## 2021-03-21 ENCOUNTER — Other Ambulatory Visit: Payer: Self-pay

## 2021-03-21 ENCOUNTER — Encounter: Payer: Self-pay | Admitting: Rheumatology

## 2021-03-21 ENCOUNTER — Ambulatory Visit: Payer: Self-pay | Admitting: Rheumatology

## 2021-03-21 DIAGNOSIS — M5116 Intervertebral disc disorders with radiculopathy, lumbar region: Secondary | ICD-10-CM | POA: Insufficient documentation

## 2021-03-21 DIAGNOSIS — Z09 Encounter for follow-up examination after completed treatment for conditions other than malignant neoplasm: Secondary | ICD-10-CM | POA: Insufficient documentation

## 2021-03-21 MED ORDER — GABAPENTIN 100 MG PO CAPS: 100.0000 mg | ORAL_CAPSULE | Freq: Three times a day (TID) | ORAL | 2 refills | Status: DC

## 2021-03-21 NOTE — Patient Instructions (Signed)
Schedule PT

## 2021-03-21 NOTE — Progress Notes (Signed)
[facility]  PROGRESS NOTE  Patient:Brian Porter Male   DOB:Jan 03, 1966     55 y.o.  HWE:[id number]  Visit Date: 03/21/2021  HPI:  55 year old white male.  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  May be applying for disability.  History of cardiomyopathy.  Prior splenectomy. 2 years pain in the back of his legs.  Mainly when he walks.  About a half a block.  Relatively predictable.  No night pain.  No numbness or tingling.  May start antibiotic but in the hamstrings and into the calf. Had vascular evaluation with abnormal ABI on the right at 0.8 and normal on the left.  Vascular did not think it was all claudication. Had ER visit.  X-ray showed new spinal listhesis at L4-5 but only grade 1.  Disc base narrowing.  He is had gabapentin.  No nonsteroidals because of cardiomyopathy.  Ejection fraction 25.  Did have some prednisone taper No night pain.  No diabetes.  No burning feet at night Some pain in the neck with range of motion.  No numbness into the arms     Past Medical History:  Diagnosis Date   CAD (coronary artery disease)    CHF (congestive heart failure) (HCC)    GERD (gastroesophageal reflux disease)    Hypertension    MI, old    Sleep apnea     Past Surgical History:  Procedure Laterality Date   CARDIAC CATHETERIZATION Right 10/16/2015   Procedure: Left Heart Cath and Coronary Angiography;  Surgeon: Laurier Nancy, MD;  Location: [facility] INVASIVE CV LAB;  Service: Cardiovascular;  Laterality: Right;   CORONARY ANGIOPLASTY WITH STENT PLACEMENT  approx 2 years ago   HERNIA REPAIR Left 11/29/2016   [facility]   PARTIAL NEPHRECTOMY Left    SPLENECTOMY      Social History   Tobacco Use   Smoking status: Former    Packs/day: 0.50    Types: Cigarettes    Quit date: 10/24/2020    Years since quitting: 0.4   Smokeless tobacco: Never  Substance Use Topics   Alcohol use: No    Alcohol/week: 0.0 standard drinks     MEDICATIONS: Current Outpatient  Medications  Medication Sig Dispense Refill   aspirin 81 MG EC tablet Take 1 tablet (81 mg total) by mouth daily. 30 tablet 2   atorvastatin (LIPITOR) 20 MG tablet Take 1 tablet (20 mg total) by mouth daily. 30 tablet 2   dapagliflozin propanediol (FARXIGA) 10 MG TABS tablet Take 1 tablet (10 mg total) by mouth daily before breakfast. (Patient not taking: Reported on 02/22/2021) 30 tablet 5   furosemide (LASIX) 40 MG tablet Take 0.5 tablets (20 mg total) by mouth daily. (Patient taking differently: Take 60 mg by mouth daily.) 30 tablet 1   gabapentin (NEURONTIN) 100 MG capsule Take 1 capsule (100 mg total) by mouth 3 (three) times daily. 30 capsule 0   Lidocaine (HM LIDOCAINE PATCH) 4 % PTCH Apply 1 patch topically every 12 (twelve) hours. (Patient taking differently: Apply 1 patch topically every 12 (twelve) hours as needed.) 30 patch 0   losartan (COZAAR) 50 MG tablet TAKE 1 TABLET BY MOUTH ONCE DAILY. DISCONTINUE ENTRESTO 90 tablet 3   methylPREDNISolone (MEDROL DOSEPAK) 4 MG TBPK tablet Take Tapered dose as directed 21 tablet 0   metoprolol succinate (TOPROL XL) 25 MG 24 hr tablet Take 1 tablet (25 mg total) by mouth daily. 90 tablet 3   orphenadrine (NORFLEX) 100 MG  tablet Take 1 tablet (100 mg total) by mouth 2 (two) times daily. 10 tablet 0   potassium chloride (KLOR-CON) 10 MEQ tablet Take 0.5 tablets (5 mEq total) by mouth daily. 45 tablet 3   spironolactone (ALDACTONE) 25 MG tablet Take 1 tablet (25 mg total) by mouth daily. 30 tablet 2   No current facility-administered medications for this visit.     ALLERGIES Allergies  Allergen Reactions   Entresto [Sacubitril-Valsartan] Cough     PHYSICAL EXAM: There were no vitals taken for this visit. Pleasant male.  Sclera clear.  Clear chest.  Good carotid upstroke.  Has bilateral femoral bruits.  He has dorsalis pedis pulses are absent.  Posterior tib pulses are 1-2+.  1+ edema.  Mild brawny changes. Musculoskeletal: Decreased range of  motion cervical spine.  Shoulders move well.  Hands without hypertrophic changes.  He has decreased extension of his lumbar spine with mild scoliosis.  Both hips move well.  Knees without effusions.  Ankles and toes without synovitis Neurologic: Symmetric knee and ankle jerks.  Able to stand on his toes and his heels.  Normal pain and   ASSESSMENT: Back and bilateral lower leg pain with walking.  Either pseudoclaudication or vascular claudication.  Vascular thinks  it is more musculoskeletal.ABIs are only mildly abnormal.  Does however have decreased pulses in the feet as well as bilateral femoral bruits.  Lumbar disc disease with L4-5 mild spondylolisthesis with degenerative changes  Cardiomyopathy with ejection fraction of 25%. precluding nonsteroidals    PLAN: Gabapentin 100 3 times daily I gave him some back exercises Will arrange physical therapy [facility]. See back after 2 months.   Decide about MRI of the lumbar spine     G. Al Pimple. MD           03/21/2021,  8:58 AM

## 2021-03-21 NOTE — Patient Instructions (Signed)

## 2021-03-21 NOTE — Progress Notes (Signed)
Established Patient Office Visit  Subjective:  Patient ID: Brian Porter, male    DOB: 1965-09-03  Age: 55 y.o. MRN: [id number]  CC:  Chief Complaint  Patient presents with   Follow-up    HPI Brian Porter is a 55 y/o male who has history of CAD, CHF, GERD, Hypertension, MI,presents for routine follow up. He was seen at the ED on 03/02/21 for leg pain . Imaging done, showed No evidence of acute osseous abnormality. 2. Progressive lower lumbar disc and facet degeneration with new grade 1 anterolisthesis at L4-5. He finished the Medrol dose pak and Norflex. He was seen by Dr Jefm Bryant at [facility] today for leg pain, he was referred to Physical therapy. He states that he's compliant with his medications, denies side effects. He denies chest pain, palpitation, dizziness and vision changes. Overall, he states that he's doing well and offers no further complaint.  Past Medical History:  Diagnosis Date   CAD (coronary artery disease)    CHF (congestive heart failure) (HCC)    GERD (gastroesophageal reflux disease)    Hypertension    MI, old    Sleep apnea     Past Surgical History:  Procedure Laterality Date   CARDIAC CATHETERIZATION Right 10/16/2015   Procedure: Left Heart Cath and Coronary Angiography;  Surgeon: Dionisio Printice, MD;  Location: [place] [facility];  Service: Cardiovascular;  Laterality: Right;   CORONARY ANGIOPLASTY WITH STENT PLACEMENT  approx 2 years ago   HERNIA REPAIR Left 11/29/2016   [facility]   PARTIAL NEPHRECTOMY Left    SPLENECTOMY      Family History  Problem Relation Age of Onset   Hypertension Mother    Other Mother        covid pneumonia   Congestive Heart Failure Mother    Heart attack Father 74   Diabetes Father    Hypertension Father    Diabetes Mellitus II Brother     Social History   Socioeconomic History   Marital status: Single    Spouse name: Not on file   Number of children: Not on file   Years of education: Not on file   Highest education  level: Not on file  Occupational History   Occupation: unemployed  Tobacco Use   Smoking status: Former    Packs/day: 0.50    Types: Cigarettes    Quit date: 10/24/2020    Years since quitting: 0.4   Smokeless tobacco: Never  Vaping Use   Vaping Use: Never used  Substance and Sexual Activity   Alcohol use: No    Alcohol/week: 0.0 standard drinks   Drug use: Not Currently    Comment: percocet   Sexual activity: Not Currently  Other Topics Concern   Not on file  Social History Narrative   Not on file   Social Determinants of Health   Financial Resource Strain: Not on file  Food Insecurity: No Food Insecurity   Worried About Running Out of Food in the Last Year: Never true   Ran Out of Food in the Last Year: Never true  Transportation Needs: No Transportation Needs   Lack of Transportation (Medical): No   Lack of Transportation (Non-Medical): No  Physical Activity: Not on file  Stress: Not on file  Social Connections: Not on file  Intimate Partner Violence: Not on file    Outpatient Medications Prior to Visit  Medication Sig Dispense Refill   aspirin 81 MG EC tablet Take 1 tablet (81 mg total)  by mouth daily. 30 tablet 2   atorvastatin (LIPITOR) 20 MG tablet Take 1 tablet (20 mg total) by mouth daily. 30 tablet 2   dapagliflozin propanediol (FARXIGA) 10 MG TABS tablet Take 1 tablet (10 mg total) by mouth daily before breakfast. (Patient not taking: No sig reported) 30 tablet 5   furosemide (LASIX) 40 MG tablet Take 0.5 tablets (20 mg total) by mouth daily. (Patient taking differently: Take 60 mg by mouth daily.) 30 tablet 1   Lidocaine (HM LIDOCAINE PATCH) 4 % PTCH Apply 1 patch topically every 12 (twelve) hours. (Patient taking differently: Apply 1 patch topically every 12 (twelve) hours as needed.) 30 patch 0   losartan (COZAAR) 50 MG tablet TAKE 1 TABLET BY MOUTH ONCE DAILY. DISCONTINUE ENTRESTO 90 tablet 3   metoprolol succinate (TOPROL XL) 25 MG 24 hr tablet Take 1  tablet (25 mg total) by mouth daily. 90 tablet 3   orphenadrine (NORFLEX) 100 MG tablet Take 1 tablet (100 mg total) by mouth 2 (two) times daily. 10 tablet 0   potassium chloride (KLOR-CON) 10 MEQ tablet Take 0.5 tablets (5 mEq total) by mouth daily. 45 tablet 3   spironolactone (ALDACTONE) 25 MG tablet Take 1 tablet (25 mg total) by mouth daily. 30 tablet 2   gabapentin (NEURONTIN) 100 MG capsule Take 1 capsule (100 mg total) by mouth 3 (three) times daily. 30 capsule 0   No facility-administered medications prior to visit.    Allergies  Allergen Reactions   Entresto [Sacubitril-Valsartan] Cough    ROS Review of Systems  Constitutional: Negative.   Respiratory: Negative.    Cardiovascular: Negative.   Skin: Negative.      Objective:    Physical Exam HENT:     Head: Normocephalic and atraumatic.  Eyes:     Extraocular Movements: Extraocular movements intact.     Conjunctiva/sclera: Conjunctivae normal.     Pupils: Pupils are equal, round, and reactive to light.  Cardiovascular:     Rate and Rhythm: Normal rate and regular rhythm.     Pulses: Normal pulses.     Heart sounds: Normal heart sounds.  Pulmonary:     Effort: Pulmonary effort is normal.     Breath sounds: Normal breath sounds.  Neurological:     General: No focal deficit present.     Mental Status: He is alert and oriented to person, place, and time. Mental status is at baseline.  Psychiatric:        Mood and Affect: Mood normal.        Behavior: Behavior normal.        Thought Content: Thought content normal.        Judgment: Judgment normal.    BP 121/75 (BP Location: Left Arm, Patient Position: Sitting, Cuff Size: Large)   Pulse 62   Temp (!) 97.1 F (36.2 C)   Resp 18   Ht '5\' 9"'  (1.753 m)   Wt 233 lb 9.6 oz (106 kg)   SpO2 96%   BMI 34.50 kg/m  Wt Readings from Last 3 Encounters:  03/21/21 233 lb 9.6 oz (106 kg)  03/21/21 233 lb 9.6 oz (106 kg)  03/02/21 228 lb (103.4 kg)   Encouraged  weight loss  Health Maintenance Due  Topic Date Due   COVID-19 Vaccine (1) Never done   Hepatitis C Screening  Never done   TETANUS/TDAP  Never done   Zoster Vaccines- Shingrix (1 of 2) Never done   INFLUENZA VACCINE  Never done  There are no preventive care reminders to display for this patient.  Lab Results  Component Value Date   TSH 0.722 07/24/2018   Lab Results  Component Value Date   WBC 13.2 (H) 10/21/2020   HGB 14.9 10/21/2020   HCT 43.9 10/21/2020   MCV 94.4 10/21/2020   PLT 365 10/21/2020   Lab Results  Component Value Date   NA 139 11/15/2020   K 4.6 11/15/2020   CO2 23 11/15/2020   GLUCOSE 74 11/15/2020   BUN 15 11/15/2020   CREATININE 0.89 11/15/2020   BILITOT 0.7 10/21/2020   ALKPHOS 50 10/21/2020   AST 15 10/21/2020   ALT 18 10/21/2020   PROT 7.0 10/21/2020   ALBUMIN 3.7 10/21/2020   CALCIUM 8.8 11/15/2020   ANIONGAP 8 10/21/2020   EGFR 101 11/15/2020   Lab Results  Component Value Date   CHOL 114 11/15/2020   Lab Results  Component Value Date   HDL 33 (L) 11/15/2020   Lab Results  Component Value Date   LDLCALC 66 11/15/2020   Lab Results  Component Value Date   TRIG 71 11/15/2020   Lab Results  Component Value Date   CHOLHDL 3.5 11/15/2020   Lab Results  Component Value Date   HGBA1C 5.9 (H) 11/15/2020      Assessment & Plan:    1. Follow up - He was encouraged to follow up with Physical therapy, continue on gabapentin and go to the ED for worsening symptoms.    Follow-up: Return in about 16 weeks (around 07/11/2021), or if symptoms worsen or fail to improve.    Naryah Clenney Jerold Coombe, NP

## 2021-04-07 ENCOUNTER — Other Ambulatory Visit: Payer: Self-pay | Admitting: Gerontology

## 2021-04-07 DIAGNOSIS — E785 Hyperlipidemia, unspecified: Secondary | ICD-10-CM

## 2021-04-09 ENCOUNTER — Other Ambulatory Visit: Payer: Self-pay | Admitting: Family

## 2021-04-09 DIAGNOSIS — E785 Hyperlipidemia, unspecified: Secondary | ICD-10-CM

## 2021-04-09 MED ORDER — ATORVASTATIN CALCIUM 20 MG PO TABS: 20.0000 mg | ORAL_TABLET | Freq: Every day | ORAL | 5 refills | Status: DC

## 2021-04-14 ENCOUNTER — Encounter: Payer: Self-pay | Admitting: Emergency Medicine

## 2021-04-14 ENCOUNTER — Emergency Department
Admission: EM | Admit: 2021-04-14 | Discharge: 2021-04-14 | Disposition: A | Discharge status: Home / Self Care | Payer: Self-pay | Attending: Emergency Medicine | Admitting: Emergency Medicine

## 2021-04-14 ENCOUNTER — Other Ambulatory Visit: Payer: Self-pay

## 2021-04-14 DIAGNOSIS — B349 Viral infection, unspecified: Secondary | ICD-10-CM | POA: Insufficient documentation

## 2021-04-14 DIAGNOSIS — Z7982 Long term (current) use of aspirin: Secondary | ICD-10-CM | POA: Insufficient documentation

## 2021-04-14 DIAGNOSIS — Z79899 Other long term (current) drug therapy: Secondary | ICD-10-CM | POA: Insufficient documentation

## 2021-04-14 DIAGNOSIS — M79605 Pain in left leg: Secondary | ICD-10-CM | POA: Insufficient documentation

## 2021-04-14 DIAGNOSIS — Z87891 Personal history of nicotine dependence: Secondary | ICD-10-CM | POA: Insufficient documentation

## 2021-04-14 DIAGNOSIS — I2510 Atherosclerotic heart disease of native coronary artery without angina pectoris: Secondary | ICD-10-CM | POA: Insufficient documentation

## 2021-04-14 DIAGNOSIS — Z955 Presence of coronary angioplasty implant and graft: Secondary | ICD-10-CM | POA: Insufficient documentation

## 2021-04-14 DIAGNOSIS — Z20822 Contact with and (suspected) exposure to covid-19: Secondary | ICD-10-CM | POA: Insufficient documentation

## 2021-04-14 DIAGNOSIS — I110 Hypertensive heart disease with heart failure: Secondary | ICD-10-CM | POA: Insufficient documentation

## 2021-04-14 DIAGNOSIS — I5023 Acute on chronic systolic (congestive) heart failure: Secondary | ICD-10-CM | POA: Insufficient documentation

## 2021-04-14 DIAGNOSIS — M79604 Pain in right leg: Secondary | ICD-10-CM | POA: Insufficient documentation

## 2021-04-14 LAB — RESP PANEL BY RT-PCR (FLU A&B, COVID) ARPGX2
Influenza A by PCR: NEGATIVE
Influenza B by PCR: NEGATIVE
SARS Coronavirus 2 by RT PCR: NEGATIVE

## 2021-04-14 MED ORDER — BENZONATATE 100 MG PO CAPS: 200.0000 mg | ORAL_CAPSULE | Freq: Three times a day (TID) | ORAL | 0 refills | Status: DC | PRN

## 2021-04-14 MED ORDER — IBUPROFEN 800 MG PO TABS: 800.0000 mg | ORAL_TABLET | Freq: Three times a day (TID) | ORAL | 0 refills | Status: DC | PRN

## 2021-04-14 MED ORDER — KETOROLAC TROMETHAMINE 60 MG/2ML IM SOLN
60.0000 mg | Freq: Once | INTRAMUSCULAR | Status: AC
  Administered 2021-04-14: 60 mg via INTRAMUSCULAR
  Filled 2021-04-14: qty 2

## 2021-04-14 MED ORDER — FEXOFENADINE-PSEUDOEPHED ER 60-120 MG PO TB12: 1.0000 | ORAL_TABLET | Freq: Two times a day (BID) | ORAL | 0 refills | Status: DC

## 2021-04-14 NOTE — ED Triage Notes (Signed)
Pt to ED via POV with c/o chills and generalized body aches, pt c/o 9/10 pain in his legs at this time. Pt states symptoms started yesterday, a-febrile on arrival to ED.

## 2021-04-14 NOTE — ED Provider Notes (Signed)
[facility] Emergency Department Provider Note   ____________________________________________   Event Date/Time   First MD Initiated Contact with Patient 04/14/21 [phone]     (approximate)  I have reviewed the triage vital signs and the nursing notes.   HISTORY  Chief Complaint Generalized Body Aches    HPI [name] is a 55 y.o. male patient presents with generalized body aches and chills.  Onset of complaint was yesterday.  Patient states no recent travel or known contact with COVID-19.  He has taken COVID-19 and booster.  Patient also complain of bilateral leg pain.  Patient was seen for this complaint on 03/02/2021.  Patient was follow-up by orthopedics and was told that his leg pain is secondary to progressive lower lumbar disks disease.  Has been recommended physical therapy but has not started the program yet.  Denies bladder or bowel dysfunction.      Past Medical History:  Diagnosis Date   CAD (coronary artery disease)    CHF (congestive heart failure) (HCC)    GERD (gastroesophageal reflux disease)    Hypertension    MI, old    Sleep apnea     Patient Active Problem List   Diagnosis Date Noted   Lumbar disc disease with radiculopathy 03/21/2021   Follow up 03/21/2021   Muscle pain 02/22/2021   Hospital discharge follow-up 11/16/2020   Intermittent claudication (HCC) 11/16/2020   CVA (cerebral vascular accident) (HCC) 10/21/2020   Prediabetes 10/05/2020   Leg pain 10/05/2020   CAP (community acquired pneumonia) 07/24/2018   Bilateral recurrent inguinal hernia without obstruction or gangrene    Acute on chronic systolic heart failure (HCC) 08/27/2016   HTN (hypertension) 11/02/2015   Tobacco abuse 11/02/2015   Hand joint pain 11/02/2015   CHF (congestive heart failure) (HCC) 10/15/2015    Past Surgical History:  Procedure Laterality Date   CARDIAC CATHETERIZATION Right 10/16/2015   Procedure: Left Heart Cath and Coronary  Angiography;  Surgeon: Laurier Nancy, MD;  Location: [facility] INVASIVE CV LAB;  Service: Cardiovascular;  Laterality: Right;   CORONARY ANGIOPLASTY WITH STENT PLACEMENT  approx 2 years ago   HERNIA REPAIR Left 11/29/2016   [facility]   PARTIAL NEPHRECTOMY Left    SPLENECTOMY      Prior to Admission medications   Medication Sig Start Date End Date Taking? Authorizing Provider  benzonatate (TESSALON PERLES) 100 MG capsule Take 2 capsules (200 mg total) by mouth 3 (three) times daily as needed. 04/14/21 04/14/22 Yes Joni Reining, PA-C  fexofenadine-pseudoephedrine (ALLEGRA-D) 60-120 MG 12 hr tablet Take 1 tablet by mouth 2 (two) times daily. 04/14/21  Yes Joni Reining, PA-C  ibuprofen (ADVIL) 800 MG tablet Take 1 tablet (800 mg total) by mouth every 8 (eight) hours as needed for moderate pain. 04/14/21  Yes Joni Reining, PA-C  aspirin 81 MG EC tablet Take 1 tablet (81 mg total) by mouth daily. 10/23/20   Elgergawy, Leana Roe, MD  atorvastatin (LIPITOR) 20 MG tablet Take 1 tablet by mouth once daily 04/10/21   Iloabachie, Chioma E, NP  dapagliflozin propanediol (FARXIGA) 10 MG TABS tablet Take 1 tablet (10 mg total) by mouth daily before breakfast. Patient not taking: No sig reported 11/03/20   Delma Freeze, FNP  furosemide (LASIX) 40 MG tablet Take 0.5 tablets (20 mg total) by mouth daily. Patient taking differently: Take 60 mg by mouth daily. 10/23/20   Elgergawy, Leana Roe, MD  gabapentin (NEURONTIN) 100 MG capsule Take 1  capsule (100 mg total) by mouth 3 (three) times daily. 03/21/21   Kandyce Rud., MD  Lidocaine (HM LIDOCAINE PATCH) 4 % PTCH Apply 1 patch topically every 12 (twelve) hours. Patient taking differently: Apply 1 patch topically every 12 (twelve) hours as needed. 10/05/20   Iloabachie, Chioma E, NP  losartan (COZAAR) 50 MG tablet TAKE 1 TABLET BY MOUTH ONCE DAILY. DISCONTINUE ENTRESTO 11/14/20   Clarisa Kindred A, FNP  metoprolol succinate (TOPROL XL) 25 MG 24 hr tablet Take 1  tablet (25 mg total) by mouth daily. 12/04/20 03/21/21  Delma Freeze, FNP  orphenadrine (NORFLEX) 100 MG tablet Take 1 tablet (100 mg total) by mouth 2 (two) times daily. 03/02/21   Joni Reining, PA-C  potassium chloride (KLOR-CON) 10 MEQ tablet Take 0.5 tablets (5 mEq total) by mouth daily. 12/04/20   Delma Freeze, FNP  spironolactone (ALDACTONE) 25 MG tablet Take 1 tablet (25 mg total) by mouth daily. 10/23/20 03/21/21  Elgergawy, Leana Roe, MD    Allergies Sherryll Burger [sacubitril-valsartan]  Family History  Problem Relation Age of Onset   Hypertension Mother    Other Mother        covid pneumonia   Congestive Heart Failure Mother    Heart attack Father 44   Diabetes Father    Hypertension Father    Diabetes Mellitus II Brother     Social History Social History   Tobacco Use   Smoking status: Former    Packs/day: 0.50    Types: Cigarettes    Quit date: 10/24/2020    Years since quitting: 0.4   Smokeless tobacco: Never  Vaping Use   Vaping Use: Never used  Substance Use Topics   Alcohol use: No    Alcohol/week: 0.0 standard drinks   Drug use: Not Currently    Comment: percocet    Review of Systems  Constitutional: No fever/chills Eyes: No visual changes. ENT: No sore throat. Cardiovascular: Denies chest pain.  CAD.  CHF. Respiratory: Denies shortness of breath. Gastrointestinal: GERD.  No abdominal pain.  No nausea, no vomiting.  No diarrhea.  No constipation. Genitourinary: Negative for dysuria. Musculoskeletal: Chronic back pain.  Bilateral leg pain.   Skin: Negative for rash. Neurological: Negative for headaches, focal weakness or numbness. Endocrine: Hypertension Allergic/Immunilogical: Sherryll Burger  ____________________________________________   PHYSICAL EXAM:  VITAL SIGNS: ED Triage Vitals  Enc Vitals Group     BP 04/14/21 0550 137/80     Pulse Rate 04/14/21 0550 (!) 102     Resp 04/14/21 0550 18     Temp 04/14/21 0550 98.3 F (36.8 C)     Temp Source  04/14/21 0550 Tympanic     SpO2 04/14/21 0550 93 %     Weight 04/14/21 0550 220 lb (99.8 kg)     Height 04/14/21 0550 5\' 9"  (1.753 m)     Head Circumference --      Peak Flow --      Pain Score 04/14/21 0555 9     Pain Loc --      Pain Edu? --      Excl. in GC? --     Constitutional: Alert and oriented. Well appearing and in no acute distress. Eyes: Conjunctivae are normal. PERRL. EOMI. Head: Atraumatic. Nose: No congestion/rhinnorhea. Mouth/Throat: Mucous membranes are moist.  Oropharynx non-erythematous. Neck: No stridor.  No cervical spine tenderness to palpation. Hematological/Lymphatic/Immunilogical: No cervical lymphadenopathy. Cardiovascular: Normal rate, regular rhythm. Grossly normal heart sounds.  Good peripheral circulation. Respiratory: Normal  respiratory effort.  No retractions. Lungs CTAB. Gastrointestinal: Soft and nontender. No distention. No abdominal bruits. No CVA tenderness. Musculoskeletal: No lower extremity tenderness nor edema.  No joint effusions. Neurologic:  Normal speech and language. No gross focal neurologic deficits are appreciated. No gait instability. Skin:  Skin is warm, dry and intact. No rash noted. Psychiatric: Mood and affect are normal. Speech and behavior are normal.  ____________________________________________   LABS (all labs ordered are listed, but only abnormal results are displayed)  Labs Reviewed  RESP PANEL BY RT-PCR (FLU A&B, COVID) [id number]   ____________________________________________  EKG   ____________________________________________  RADIOLOGY I, Joni Reining, personally viewed and evaluated these images (plain radiographs) as part of my medical decision making, as well as reviewing the written report by the radiologist.  ED MD interpretation:    Official radiology report(s): No results found.  ____________________________________________   PROCEDURES  Procedure(s) performed (including Critical  Care):  Procedures   ____________________________________________   INITIAL IMPRESSION / ASSESSMENT AND PLAN / ED COURSE  As part of my medical decision making, I reviewed the following data within the electronic MEDICAL RECORD NUMBER         Patient presents with generalized body aches and chills.  Patient denies dyspnea or chest pain.  Patient labs were negative for COVID-19 and influenza.  Patient complaint and physical exam consistent with viral illness.  Patient given a prescription for fexofenadine with Sudafed, Tessalon Perles, and ibuprofen.  Patient advised to follow-up with scheduled physical therapy for back and leg complaint.  Return to ED if condition worsens.  Patient is able to ambulate without assistance from exam room to the waiting room.     ____________________________________________   FINAL CLINICAL IMPRESSION(S) / ED DIAGNOSES  Final diagnoses:  Viral illness     ED Discharge Orders          Ordered    fexofenadine-pseudoephedrine (ALLEGRA-D) 60-120 MG 12 hr tablet  2 times daily        04/14/21 0749    benzonatate (TESSALON PERLES) 100 MG capsule  3 times daily PRN        04/14/21 0749    ibuprofen (ADVIL) 800 MG tablet  Every 8 hours PRN        04/14/21 0749             Note:  This document was prepared using Dragon voice recognition software and may include unintentional dictation errors.    Joni Reining, PA-C 04/14/21 0759    Concha Se, MD 04/19/21 1254

## 2021-04-14 NOTE — Discharge Instructions (Addendum)
Your COVID-19 and flu test was negative.  Read and follow discharge care instructions.  Take medication as directed

## 2021-04-17 ENCOUNTER — Ambulatory Visit: Payer: Self-pay | Admitting: Gerontology

## 2021-04-17 ENCOUNTER — Emergency Department: Payer: Self-pay

## 2021-04-17 ENCOUNTER — Other Ambulatory Visit: Payer: Self-pay

## 2021-04-17 ENCOUNTER — Emergency Department
Admission: EM | Admit: 2021-04-17 | Discharge: 2021-04-17 | Disposition: A | Discharge status: Home / Self Care | Payer: Self-pay | Attending: Emergency Medicine | Admitting: Emergency Medicine

## 2021-04-17 DIAGNOSIS — Z7982 Long term (current) use of aspirin: Secondary | ICD-10-CM | POA: Insufficient documentation

## 2021-04-17 DIAGNOSIS — I110 Hypertensive heart disease with heart failure: Secondary | ICD-10-CM | POA: Insufficient documentation

## 2021-04-17 DIAGNOSIS — I2510 Atherosclerotic heart disease of native coronary artery without angina pectoris: Secondary | ICD-10-CM | POA: Insufficient documentation

## 2021-04-17 DIAGNOSIS — J069 Acute upper respiratory infection, unspecified: Secondary | ICD-10-CM | POA: Insufficient documentation

## 2021-04-17 DIAGNOSIS — J209 Acute bronchitis, unspecified: Secondary | ICD-10-CM | POA: Insufficient documentation

## 2021-04-17 DIAGNOSIS — I509 Heart failure, unspecified: Secondary | ICD-10-CM | POA: Insufficient documentation

## 2021-04-17 DIAGNOSIS — Z87891 Personal history of nicotine dependence: Secondary | ICD-10-CM | POA: Insufficient documentation

## 2021-04-17 DIAGNOSIS — J40 Bronchitis, not specified as acute or chronic: Secondary | ICD-10-CM

## 2021-04-17 DIAGNOSIS — Z79899 Other long term (current) drug therapy: Secondary | ICD-10-CM | POA: Insufficient documentation

## 2021-04-17 IMAGING — CR DG CHEST 2V
2 series · 2 of 2 positions shown · non-contrast
Comparison: [DATE]

CLINICAL DATA: r/o pna

EXAM:
CHEST - 2 VIEW

[chest pa]
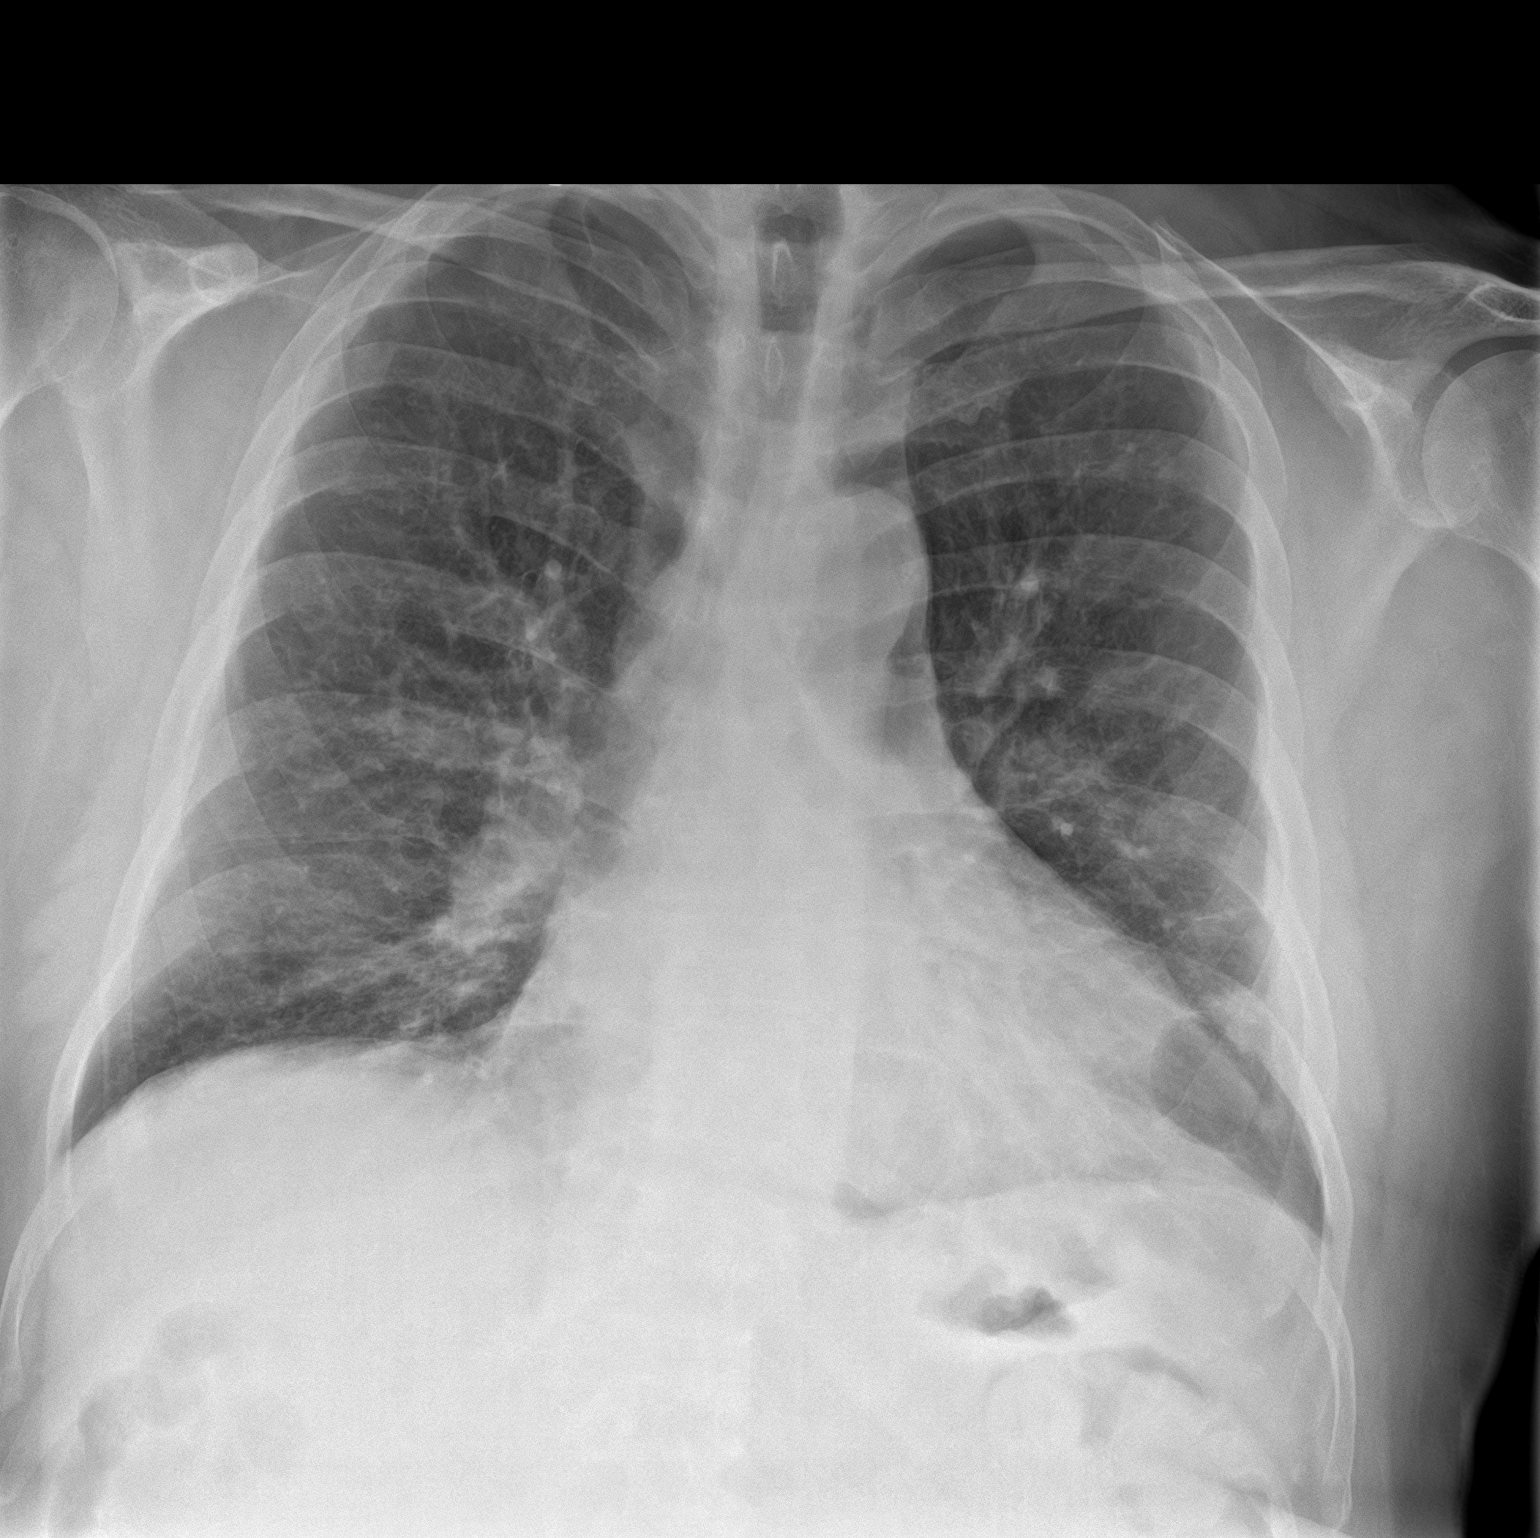

[chest lat]
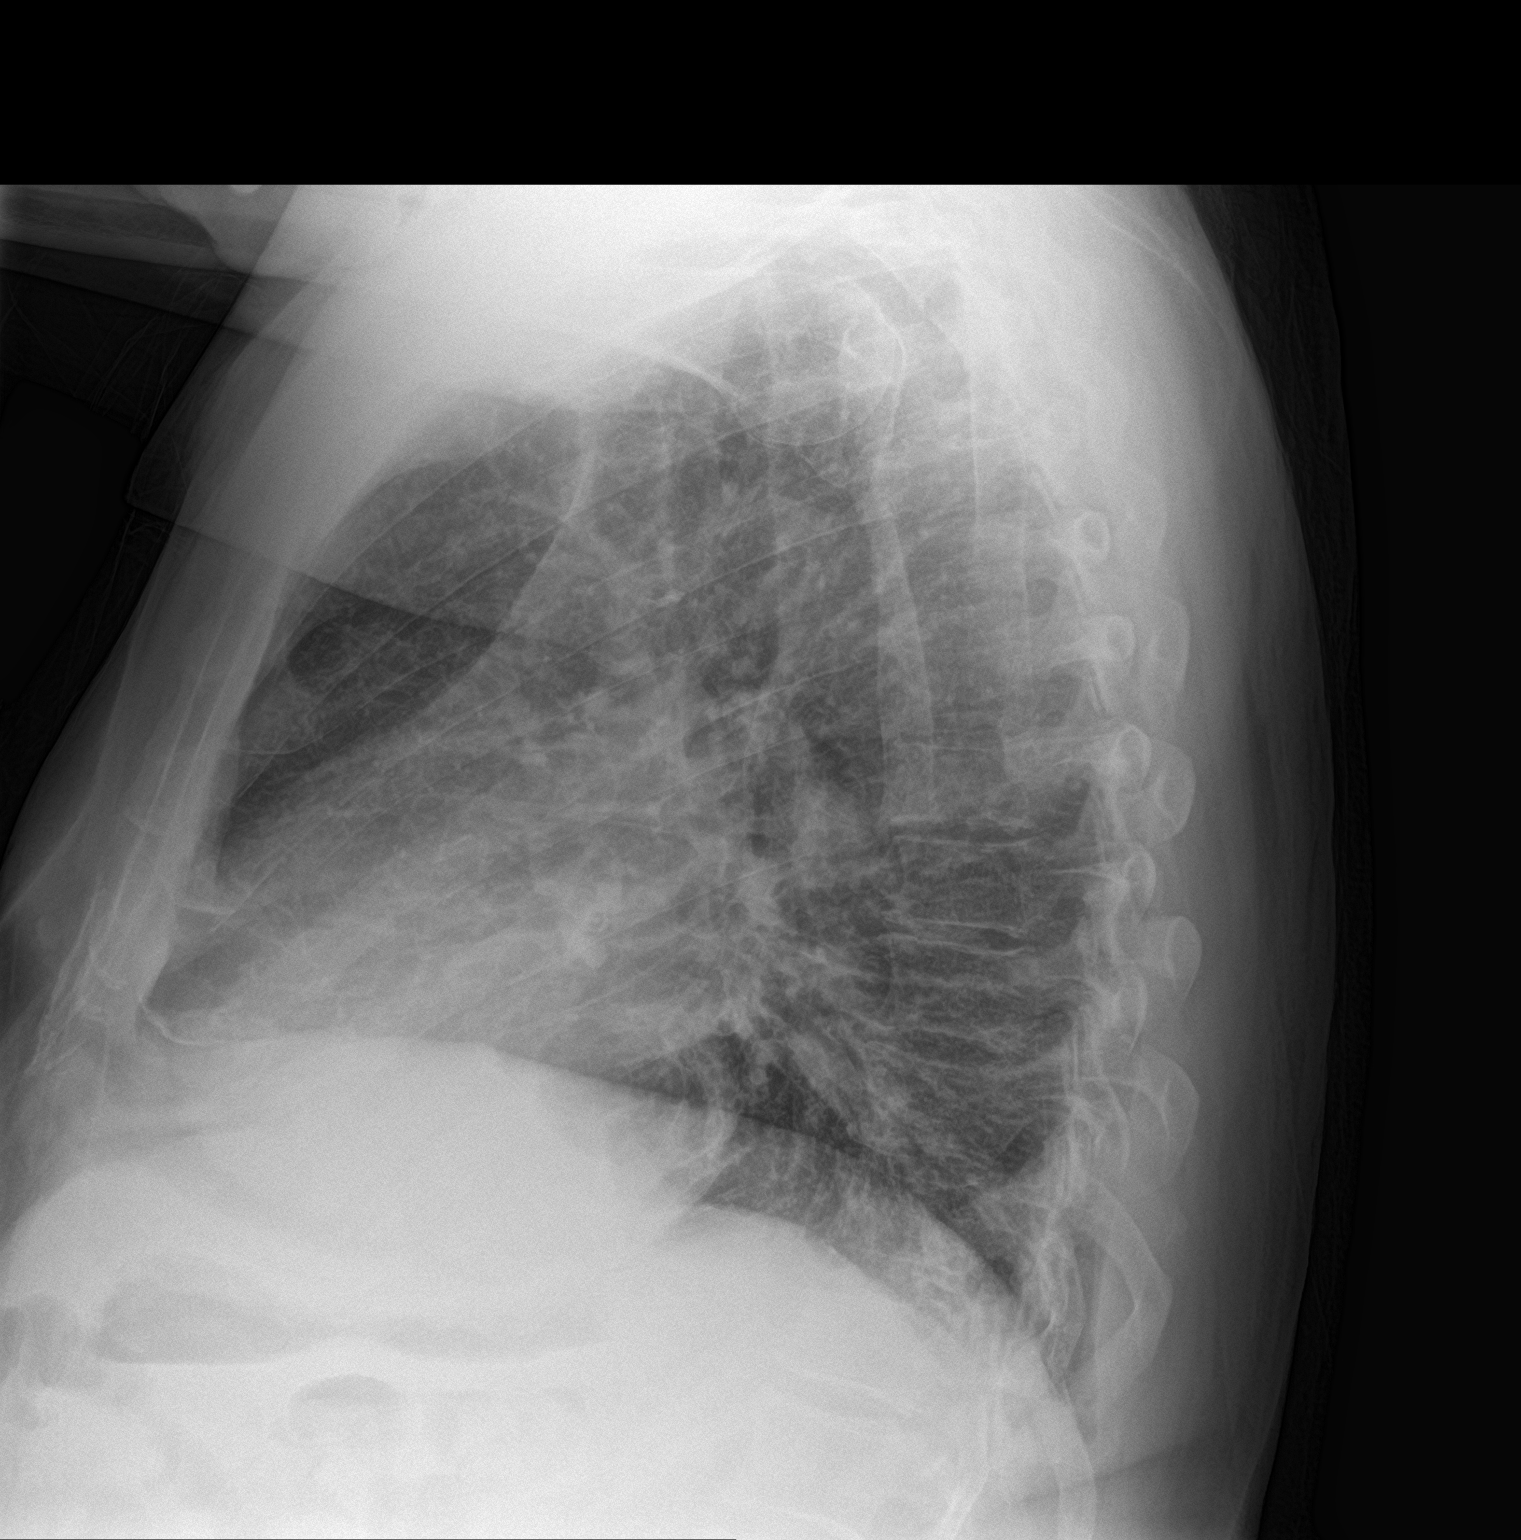

[2 of 2 positions shown; findings below may reference images not displayed]

FINDINGS: The cardiomediastinal silhouette is unchanged in contour.Apical
emphysematous changes. No pleural effusion. No pneumothorax.
Increased nodular heterogeneous opacity of the LEFT lateral lung
base. Visualized abdomen is unremarkable. Mild degenerative changes
of the thoracic spine.
IMPRESSION: Increased nodular opacity of the LEFT lateral lung base.
Differential considerations include infection, atelectasis or
underlying nodule.

Followup PA and lateral chest X-ray versus chest CT is recommended
in 3-4 weeks following trial of antibiotic therapy to ensure
resolution and exclude underlying malignancy.

## 2021-04-17 MED ORDER — BENZONATATE 100 MG PO CAPS: 100.0000 mg | ORAL_CAPSULE | Freq: Four times a day (QID) | ORAL | 0 refills | Status: AC | PRN

## 2021-04-17 MED ORDER — IBUPROFEN 400 MG PO TABS
400.0000 mg | ORAL_TABLET | Freq: Once | ORAL | Status: AC
  Administered 2021-04-17: 400 mg via ORAL
  Filled 2021-04-17: qty 1

## 2021-04-17 MED ORDER — PHENYLEPHRINE HCL 10 MG PO TABS
10.0000 mg | ORAL_TABLET | Freq: Once | ORAL | Status: AC
  Administered 2021-04-17: 10 mg via ORAL
  Filled 2021-04-17: qty 1

## 2021-04-17 NOTE — Discharge Instructions (Addendum)
Your chest x-ray does not show to you have pneumonia.  You likely have a viral upper respiratory infection.  Medications to try for cough include Robitussin and DayQuil.  You can also use the [employer] which I have called into your pharmacy.

## 2021-04-17 NOTE — ED Triage Notes (Signed)
Pt states that he started with a cough and congestion on Friday, states that the was seen in the ER on sat and states that he feels like he may have  pneumonia

## 2021-04-17 NOTE — ED Provider Notes (Signed)
[facility]  ____________________________________________   Event Date/Time   First MD Initiated Contact with Patient 04/17/21 1102     (approximate)  I have reviewed the triage vital signs and the nursing notes.   HISTORY  Chief Complaint Cough and Nasal Congestion    HPI Brian Porter is a 55 y.o. male past medical history of CAD, CHF, hypertension who presents with cough and congestion.  Symptoms started on Friday, 4 days ago.  He was seen in the emergency department and was diagnosed with a viral upper respiratory infection.  COVID testing and flu testing were negative.  He has had ongoing cough.  Initially cough is productive of yellow sputum but it is now clear.  No hemoptysis.  He denies significant dyspnea, no chest pain.  He also has significant nasal congestion and nasal discharge.  No fevers or chills.  He denies lower extremity edema.  He is concerned that he has pneumonia because he hears rattling in his chest.         Past Medical History:  Diagnosis Date   CAD (coronary artery disease)    CHF (congestive heart failure) (HCC)    GERD (gastroesophageal reflux disease)    Hypertension    MI, old    Sleep apnea     Patient Active Problem List   Diagnosis Date Noted   Lumbar disc disease with radiculopathy 03/21/2021   Follow up 03/21/2021   Muscle pain 02/22/2021   Hospital discharge follow-up 11/16/2020   Intermittent claudication (HCC) 11/16/2020   CVA (cerebral vascular accident) (HCC) 10/21/2020   Prediabetes 10/05/2020   Leg pain 10/05/2020   CAP (community acquired pneumonia) 07/24/2018   Bilateral recurrent inguinal hernia without obstruction or gangrene    Acute on chronic systolic heart failure (HCC) 08/27/2016   HTN (hypertension) 11/02/2015   Tobacco abuse 11/02/2015   Hand joint pain 11/02/2015   CHF (congestive heart failure) (HCC) 10/15/2015    Past Surgical History:  Procedure Laterality Date   CARDIAC  CATHETERIZATION Right 10/16/2015   Procedure: Left Heart Cath and Coronary Angiography;  Surgeon: Laurier Nancy, MD;  Location: [facility] INVASIVE CV LAB;  Service: Cardiovascular;  Laterality: Right;   CORONARY ANGIOPLASTY WITH STENT PLACEMENT  approx 2 years ago   HERNIA REPAIR Left 11/29/2016   [facility]   PARTIAL NEPHRECTOMY Left    SPLENECTOMY      Prior to Admission medications   Medication Sig Start Date End Date Taking? Authorizing Provider  benzonatate (TESSALON PERLES) 100 MG capsule Take 1 capsule (100 mg total) by mouth every 6 (six) hours as needed for up to 10 days for cough. 04/17/21 04/27/21 Yes Georga Hacking, MD  aspirin 81 MG EC tablet Take 1 tablet (81 mg total) by mouth daily. 10/23/20   Elgergawy, Leana Roe, MD  atorvastatin (LIPITOR) 20 MG tablet Take 1 tablet by mouth once daily 04/10/21   Iloabachie, Chioma E, NP  dapagliflozin propanediol (FARXIGA) 10 MG TABS tablet Take 1 tablet (10 mg total) by mouth daily before breakfast. Patient not taking: No sig reported 11/03/20   Clarisa Kindred A, FNP  fexofenadine-pseudoephedrine (ALLEGRA-D) 60-120 MG 12 hr tablet Take 1 tablet by mouth 2 (two) times daily. 04/14/21   Joni Reining, PA-C  furosemide (LASIX) 40 MG tablet Take 0.5 tablets (20 mg total) by mouth daily. Patient taking differently: Take 60 mg by mouth daily. 10/23/20   Elgergawy, Leana Roe, MD  gabapentin (NEURONTIN) 100 MG capsule Take 1 capsule (  100 mg total) by mouth 3 (three) times daily. 03/21/21   Kandyce Rud., MD  ibuprofen (ADVIL) 800 MG tablet Take 1 tablet (800 mg total) by mouth every 8 (eight) hours as needed for moderate pain. 04/14/21   Joni Reining, PA-C  Lidocaine (HM LIDOCAINE PATCH) 4 % PTCH Apply 1 patch topically every 12 (twelve) hours. Patient taking differently: Apply 1 patch topically every 12 (twelve) hours as needed. 10/05/20   Iloabachie, Chioma E, NP  losartan (COZAAR) 50 MG tablet TAKE 1 TABLET BY MOUTH ONCE DAILY. DISCONTINUE ENTRESTO  11/14/20   Clarisa Kindred A, FNP  metoprolol succinate (TOPROL XL) 25 MG 24 hr tablet Take 1 tablet (25 mg total) by mouth daily. 12/04/20 03/21/21  Delma Freeze, FNP  orphenadrine (NORFLEX) 100 MG tablet Take 1 tablet (100 mg total) by mouth 2 (two) times daily. 03/02/21   Joni Reining, PA-C  potassium chloride (KLOR-CON) 10 MEQ tablet Take 0.5 tablets (5 mEq total) by mouth daily. 12/04/20   Delma Freeze, FNP  spironolactone (ALDACTONE) 25 MG tablet Take 1 tablet (25 mg total) by mouth daily. 10/23/20 03/21/21  Elgergawy, Leana Roe, MD    Allergies Sherryll Burger [sacubitril-valsartan]  Family History  Problem Relation Age of Onset   Hypertension Mother    Other Mother        covid pneumonia   Congestive Heart Failure Mother    Heart attack Father 26   Diabetes Father    Hypertension Father    Diabetes Mellitus II Brother     Social History Social History   Tobacco Use   Smoking status: Former    Packs/day: 0.50    Types: Cigarettes    Quit date: 10/24/2020    Years since quitting: 0.4   Smokeless tobacco: Never  Vaping Use   Vaping Use: Never used  Substance Use Topics   Alcohol use: No    Alcohol/week: 0.0 standard drinks   Drug use: Not Currently    Comment: percocet    Review of Systems   Review of Systems  Constitutional:  Negative for chills and fever.  Respiratory:  Positive for cough. Negative for chest tightness and shortness of breath.   Cardiovascular:  Negative for chest pain, palpitations and leg swelling.  Gastrointestinal:  Negative for abdominal pain, nausea and vomiting.  All other systems reviewed and are negative.  Physical Exam Updated Vital Signs BP 123/74 (BP Location: Left Arm)   Pulse 67   Temp 98 F (36.7 C) (Oral)   Resp 18   Ht 5\' 9"  (1.753 m)   Wt 102.1 kg   SpO2 94%   BMI 33.23 kg/m   Physical Exam Vitals and nursing note reviewed.  Constitutional:      General: He is not in acute distress.    Appearance: Normal appearance.   HENT:     Head: Normocephalic and atraumatic.  Eyes:     General: No scleral icterus.    Conjunctiva/sclera: Conjunctivae normal.  Pulmonary:     Effort: Pulmonary effort is normal. No respiratory distress.     Breath sounds: No wheezing.     Comments: Scattered rhonchi throughout, good air movement, no wheezing Abdominal:     General: Abdomen is flat. There is no distension.     Palpations: Abdomen is soft.     Tenderness: There is no abdominal tenderness.  Musculoskeletal:        General: No deformity or signs of injury.     Cervical  back: Normal range of motion.     Right lower leg: No edema.     Left lower leg: No edema.  Skin:    Coloration: Skin is not jaundiced or pale.  Neurological:     General: No focal deficit present.     Mental Status: He is alert and oriented to person, place, and time. Mental status is at baseline.  Psychiatric:        Mood and Affect: Mood normal.        Behavior: Behavior normal.     LABS (all labs ordered are listed, but only abnormal results are displayed)  Labs Reviewed - No data to display ____________________________________________  EKG  N/a ____________________________________________  RADIOLOGY Ky Barban, personally viewed and evaluated these images (plain radiographs) as part of my medical decision making, as well as reviewing the written report by the radiologist.  ED MD interpretation:  I reviewed the CXR which does not show any acute cardiopulmonary process      ____________________________________________   PROCEDURES  Procedure(s) performed (including Critical Care):  Procedures   ____________________________________________   INITIAL IMPRESSION / ASSESSMENT AND PLAN / ED COURSE     Patient is a 55 year old male who presents with ongoing cough and congestion.  Seen several days ago with a negative COVID and flu testing but here with ongoing cough and concern for pneumonia.  Initially cough was  productive of yellow sputum but this is now clearing.  He does not have significant dyspnea or chest pain.  No symptoms of volume overload.  On exam he is very well-appearing.  Does have frequent cough.  He has some rhonchi on exam but is moving good air without wheezing.  Does not have any lower extremity edema.  Chest x-ray is obtained which does not show any infiltrate or evidence of pneumonia.  Suspect bronchitis secondary to viral etiology.  Discussed the findings with the patient.  He asked for prescription for [facility] which I think is reasonable.  Also advised Robitussin and other decongestions for symptom control.      ____________________________________________   FINAL CLINICAL IMPRESSION(S) / ED DIAGNOSES  Final diagnoses:  Viral URI  Bronchitis     ED Discharge Orders          Ordered    benzonatate (TESSALON PERLES) 100 MG capsule  Every 6 hours PRN        04/17/21 1304             Note:  This document was prepared using Dragon voice recognition software and may include unintentional dictation errors.    Georga Hacking, MD 04/17/21 [phone]

## 2021-04-17 NOTE — ED Notes (Signed)
Pharmacy contacted for missing med 

## 2021-05-16 ENCOUNTER — Ambulatory Visit: Payer: Self-pay | Admitting: Rheumatology

## 2021-05-28 ENCOUNTER — Ambulatory Visit: Payer: Self-pay | Admitting: Family

## 2021-05-28 NOTE — Progress Notes (Signed)
Patient ID: Brian Porter, male    DOB: Jul 02, 1965, 55 y.o.   MRN: [id number]   Brian Porter is a 55 y/o male with a history of obstructive sleep apnea (without CPAP), CAD, HTN, GERD, MI, current tobacco use and chronic heart failure.   Echo report from 10/22/20 reviewed and showed an EF of 25-30% along with mild Brian/AR. Echo report from 05/24/2019 reviewed and showed an EF of 30-35% along with trivial TR/PR. Echo done 10/15/15 and showed an EF of 35% along with moderate Brian/TR and an elevated PA pressure of 48 mm Hg.   Had a cardiac catheterization done 10/16/15 and showed an EF of 15% along with multi-vessel disease. Recommended treatment for cardiomyopathy.   Was in the ED 04/17/21 due to viral bronchitis where he was evaluated and released. Was in the ED 04/14/21 due to viral illness where he was evaluated and released.   He presents today for a follow-up visit with a chief complaint of minimal shortness of breath upon moderate exertion. He describes this as chronic in nature having been present for several years. He has associated intermittent dizziness and gradual weight gain along with this. He denies any difficulty sleeping, abdominal distention, palpitations, pedal edema, chest pain, cough or fatigue.   Continues to have right leg pain and he's getting weekly PT which he says helps a little bit. Did have a short prednisone treatment which he said helped quite a bit.   Quit smoking 6 months ago and has gained 14 pounds during this time.   Past Medical History:  Diagnosis Date   CAD (coronary artery disease)    CHF (congestive heart failure) (HCC)    GERD (gastroesophageal reflux disease)    Hypertension    MI, old    Sleep apnea    Past Surgical History:  Procedure Laterality Date   CARDIAC CATHETERIZATION Right 10/16/2015   Procedure: Left Heart Cath and Coronary Angiography;  Surgeon: Laurier Nancy, MD;  Location: [facility] INVASIVE CV LAB;  Service: Cardiovascular;  Laterality: Right;    CORONARY ANGIOPLASTY WITH STENT PLACEMENT  approx 2 years ago   HERNIA REPAIR Left 11/29/2016   [facility]   PARTIAL NEPHRECTOMY Left    SPLENECTOMY     Family History  Problem Relation Age of Onset   Hypertension Mother    Other Mother        covid pneumonia   Congestive Heart Failure Mother    Heart attack Father 42   Diabetes Father    Hypertension Father    Diabetes Mellitus II Brother    Social History   Tobacco Use   Smoking status: Former    Packs/day: 0.50    Types: Cigarettes    Quit date: 10/24/2020    Years since quitting: 0.5   Smokeless tobacco: Never  Substance Use Topics   Alcohol use: No    Alcohol/week: 0.0 standard drinks   Allergies  Allergen Reactions   Entresto [Sacubitril-Valsartan] Cough   Prior to Admission medications   Medication Sig Start Date End Date Taking? Authorizing Provider  aspirin 81 MG EC tablet Take 1 tablet (81 mg total) by mouth daily. 10/23/20  Yes Elgergawy, Leana Roe, MD  atorvastatin (LIPITOR) 20 MG tablet Take 1 tablet by mouth once daily 04/10/21  Yes Iloabachie, Chioma E, NP  fexofenadine-pseudoephedrine (ALLEGRA-D) 60-120 MG 12 hr tablet Take 1 tablet by mouth 2 (two) times daily. 04/14/21  Yes Joni Reining, PA-C  furosemide (LASIX) 40 MG tablet Take 0.5  tablets (20 mg total) by mouth daily. Patient taking differently: Take 40 mg by mouth daily. Patient taking one 40 mg tablet daily 10/23/20  Yes Elgergawy, Leana Roe, MD  gabapentin (NEURONTIN) 100 MG capsule Take 1 capsule (100 mg total) by mouth 3 (three) times daily. 03/21/21  Yes Kandyce Rud., MD  ibuprofen (ADVIL) 800 MG tablet Take 1 tablet (800 mg total) by mouth every 8 (eight) hours as needed for moderate pain. 04/14/21  Yes Joni Reining, PA-C  Lidocaine (HM LIDOCAINE PATCH) 4 % PTCH Apply 1 patch topically every 12 (twelve) hours. Patient taking differently: Apply 1 patch topically every 12 (twelve) hours as needed. 10/05/20  Yes Iloabachie, Chioma E, NP  losartan  (COZAAR) 50 MG tablet TAKE 1 TABLET BY MOUTH ONCE DAILY. DISCONTINUE ENTRESTO 11/14/20  Yes Clarisa Kindred A, FNP  metoprolol succinate (TOPROL XL) 25 MG 24 hr tablet Take 1 tablet (25 mg total) by mouth daily. 12/04/20  Yes Javyon Fontan, Inetta Fermo A, FNP  potassium chloride (KLOR-CON) 10 MEQ tablet Take 0.5 tablets (5 mEq total) by mouth daily. 12/04/20  Yes Clarisa Kindred A, FNP  spironolactone (ALDACTONE) 25 MG tablet Take 1 tablet (25 mg total) by mouth daily. 10/23/20  Yes Elgergawy, Leana Roe, MD  orphenadrine (NORFLEX) 100 MG tablet Take 1 tablet (100 mg total) by mouth 2 (two) times daily. Patient not taking: Reported on 05/29/2021 03/02/21   Joni Reining, PA-C    Review of Systems  Constitutional:  Negative for appetite change and fatigue.  HENT:  Negative for congestion, postnasal drip and sore throat.   Eyes: Negative.   Respiratory:  Positive for shortness of breath (minimal). Negative for cough and chest tightness.   Cardiovascular:  Negative for chest pain, palpitations and leg swelling.  Gastrointestinal:  Negative for abdominal distention.  Endocrine: Negative.   Genitourinary: Negative.   Musculoskeletal:  Positive for myalgias (right leg). Negative for back pain.  Skin: Negative.   Allergic/Immunologic: Negative.   Neurological:  Positive for dizziness ("rarely"). Negative for light-headedness.  Hematological:  Negative for adenopathy. Does not bruise/bleed easily.  Psychiatric/Behavioral:  Negative for dysphoric mood and sleep disturbance (sleeping on 2 pillows). The patient is not nervous/anxious.    Vitals:   05/29/21 0845  BP: 118/62  Pulse: 66  Resp: 18  SpO2: 100%  Weight: 238 lb 6 oz (108.1 kg)  Height: 5\' 9"  (1.753 m)   Wt Readings from Last 3 Encounters:  05/29/21 238 lb 6 oz (108.1 kg)  04/17/21 225 lb (102.1 kg)  04/14/21 220 lb (99.8 kg)   Lab Results  Component Value Date   CREATININE 0.89 11/15/2020   CREATININE 0.63 10/21/2020   CREATININE 0.70 10/19/2020    Physical Exam Vitals and nursing note reviewed.  Constitutional:      Appearance: He is well-developed.  HENT:     Head: Normocephalic and atraumatic.  Neck:     Vascular: No JVD.  Cardiovascular:     Rate and Rhythm: Normal rate and regular rhythm.  Pulmonary:     Effort: Pulmonary effort is normal.     Breath sounds: No wheezing or rales.  Abdominal:     General: There is no distension.     Palpations: Abdomen is soft.     Tenderness: There is no abdominal tenderness.  Musculoskeletal:        General: No tenderness.     Cervical back: Normal range of motion and neck supple.  Skin:    General: Skin  is warm and dry.  Neurological:     Mental Status: He is alert and oriented to person, place, and time.  Psychiatric:        Behavior: Behavior normal.        Thought Content: Thought content normal.      Assessment & Plan:   1: Chronic heart failure with reduced ejection fraction- - NYHA class II - euvolemic in appearance  - weighing daily. Reminded to call for an overnight weight gain of >2 pounds or a weekly weight gain of >5 pounds.  - weight up 14 pounds since he was last here 6 months ago - currently on GDMT of losartan, metoprolol & spironolactone - tried farxiga but was unable to ever get through to patient assistance for follow-up and he can't afford to buy it out right - will try jardiance 10mg  daily; 2 weeks samples provided along with 30 day voucher; have also filled out patient assistance for this medication today - will get BMP next visit - not adding salt to his food and tries to follow a 2000mg  sodium diet - BNP 07/24/2018 was 207.0 - has not gotten his flu vaccine yet  2: HTN- - BP looks good (118/62) - saw provider at [facility] 03/21/21; returns 07/11/20 - reviewed BMP from 11/15/20 which showed sodium 139, potassium 4.6, creatinine 0.89 and GFR 101  3: Tobacco use- - stopped smoking 6 months ago - has gained 14 pounds during this time and he was  encouraged to be mindful of what he's eating as well as the importance of increasing his activity - congratulated on that and continued cessation encouraged    Medication bottles reviewed.   Return in 1 month or sooner for any questions/problems before then.

## 2021-05-29 ENCOUNTER — Ambulatory Visit: Payer: Self-pay | Attending: Family | Admitting: Family

## 2021-05-29 ENCOUNTER — Encounter: Payer: Self-pay | Admitting: Family

## 2021-05-29 ENCOUNTER — Other Ambulatory Visit: Payer: Self-pay

## 2021-05-29 VITALS — BP 118/62 | HR 66 | Resp 18 | Ht 69.0 in | Wt 238.4 lb

## 2021-05-29 DIAGNOSIS — I110 Hypertensive heart disease with heart failure: Secondary | ICD-10-CM | POA: Insufficient documentation

## 2021-05-29 DIAGNOSIS — I2510 Atherosclerotic heart disease of native coronary artery without angina pectoris: Secondary | ICD-10-CM | POA: Insufficient documentation

## 2021-05-29 DIAGNOSIS — I10 Essential (primary) hypertension: Secondary | ICD-10-CM

## 2021-05-29 DIAGNOSIS — I5022 Chronic systolic (congestive) heart failure: Secondary | ICD-10-CM | POA: Insufficient documentation

## 2021-05-29 DIAGNOSIS — Z79899 Other long term (current) drug therapy: Secondary | ICD-10-CM | POA: Insufficient documentation

## 2021-05-29 DIAGNOSIS — Z720 Tobacco use: Secondary | ICD-10-CM

## 2021-05-29 DIAGNOSIS — K219 Gastro-esophageal reflux disease without esophagitis: Secondary | ICD-10-CM | POA: Insufficient documentation

## 2021-05-29 DIAGNOSIS — G4733 Obstructive sleep apnea (adult) (pediatric): Secondary | ICD-10-CM | POA: Insufficient documentation

## 2021-05-29 DIAGNOSIS — I252 Old myocardial infarction: Secondary | ICD-10-CM | POA: Insufficient documentation

## 2021-05-29 DIAGNOSIS — M79604 Pain in right leg: Secondary | ICD-10-CM | POA: Insufficient documentation

## 2021-05-29 DIAGNOSIS — Z7984 Long term (current) use of oral hypoglycemic drugs: Secondary | ICD-10-CM | POA: Insufficient documentation

## 2021-05-29 DIAGNOSIS — Z87891 Personal history of nicotine dependence: Secondary | ICD-10-CM | POA: Insufficient documentation

## 2021-05-29 MED ORDER — EMPAGLIFLOZIN 10 MG PO TABS: 10.0000 mg | ORAL_TABLET | Freq: Every day | ORAL | 5 refills | Status: DC

## 2021-05-29 NOTE — Patient Instructions (Addendum)
Continue weighing daily and call for an overnight weight gain of > 2 pounds or a weekly weight gain of >5 pounds.    Begin taking jardiance as 1 tablet every morning

## 2021-05-31 ENCOUNTER — Ambulatory Visit: Payer: Self-pay

## 2021-05-31 ENCOUNTER — Other Ambulatory Visit: Payer: Self-pay

## 2021-05-31 ENCOUNTER — Emergency Department
Admission: EM | Admit: 2021-05-31 | Discharge: 2021-06-01 | Disposition: A | Discharge status: Home / Self Care | Payer: Self-pay | Attending: Emergency Medicine | Admitting: Emergency Medicine

## 2021-05-31 ENCOUNTER — Encounter: Payer: Self-pay | Admitting: Emergency Medicine

## 2021-05-31 ENCOUNTER — Emergency Department: Payer: Self-pay

## 2021-05-31 DIAGNOSIS — Z7982 Long term (current) use of aspirin: Secondary | ICD-10-CM | POA: Insufficient documentation

## 2021-05-31 DIAGNOSIS — I2510 Atherosclerotic heart disease of native coronary artery without angina pectoris: Secondary | ICD-10-CM | POA: Insufficient documentation

## 2021-05-31 DIAGNOSIS — Z79899 Other long term (current) drug therapy: Secondary | ICD-10-CM | POA: Insufficient documentation

## 2021-05-31 DIAGNOSIS — U071 COVID-19: Secondary | ICD-10-CM | POA: Insufficient documentation

## 2021-05-31 DIAGNOSIS — I5023 Acute on chronic systolic (congestive) heart failure: Secondary | ICD-10-CM | POA: Insufficient documentation

## 2021-05-31 DIAGNOSIS — R0602 Shortness of breath: Secondary | ICD-10-CM

## 2021-05-31 DIAGNOSIS — I110 Hypertensive heart disease with heart failure: Secondary | ICD-10-CM | POA: Insufficient documentation

## 2021-05-31 DIAGNOSIS — Z87891 Personal history of nicotine dependence: Secondary | ICD-10-CM | POA: Insufficient documentation

## 2021-05-31 LAB — CBC
HCT: 44.3 % (ref 39.0–52.0)
Hemoglobin: 14.3 g/dL (ref 13.0–17.0)
MCH: 31.3 pg (ref 26.0–34.0)
MCHC: 32.3 g/dL (ref 30.0–36.0)
MCV: 96.9 fL (ref 80.0–100.0)
Platelets: 392 10*3/uL (ref 150–400)
RBC: 4.57 MIL/uL (ref 4.22–5.81)
RDW: 12.9 % (ref 11.5–15.5)
WBC: 18.3 10*3/uL — ABNORMAL HIGH (ref 4.0–10.5)
nRBC: 0 % (ref 0.0–0.2)

## 2021-05-31 LAB — BASIC METABOLIC PANEL
Anion gap: 6 (ref 5–15)
BUN: 18 mg/dL (ref 6–20)
CO2: 27 mmol/L (ref 22–32)
Calcium: 9.1 mg/dL (ref 8.9–10.3)
Chloride: 104 mmol/L (ref 98–111)
Creatinine, Ser: 0.83 mg/dL (ref 0.61–1.24)
GFR, Estimated: >60 mL/min (ref >60)
Glucose, Bld: 123 mg/dL — ABNORMAL HIGH (ref 70–99)
Potassium: 3.9 mmol/L (ref 3.5–5.1)
Sodium: 137 mmol/L (ref 135–145)

## 2021-05-31 LAB — TROPONIN I (HIGH SENSITIVITY): Troponin I (High Sensitivity): 11 ng/L (ref <18)

## 2021-05-31 LAB — RESP PANEL BY RT-PCR (FLU A&B, COVID) ARPGX2
Influenza A by PCR: NEGATIVE
Influenza B by PCR: NEGATIVE
SARS Coronavirus 2 by RT PCR: POSITIVE — AB

## 2021-05-31 IMAGING — CR DG CHEST 2V
2 series · 2 of 2 positions shown · non-contrast
Comparison: Prior chest radiographs [DATE] and earlier.

CLINICAL DATA: Chest pain. Additional history provided: Patient
reports midsternal chest pain, cough for 1 day.

EXAM:
CHEST - 2 VIEW

[chest pa]
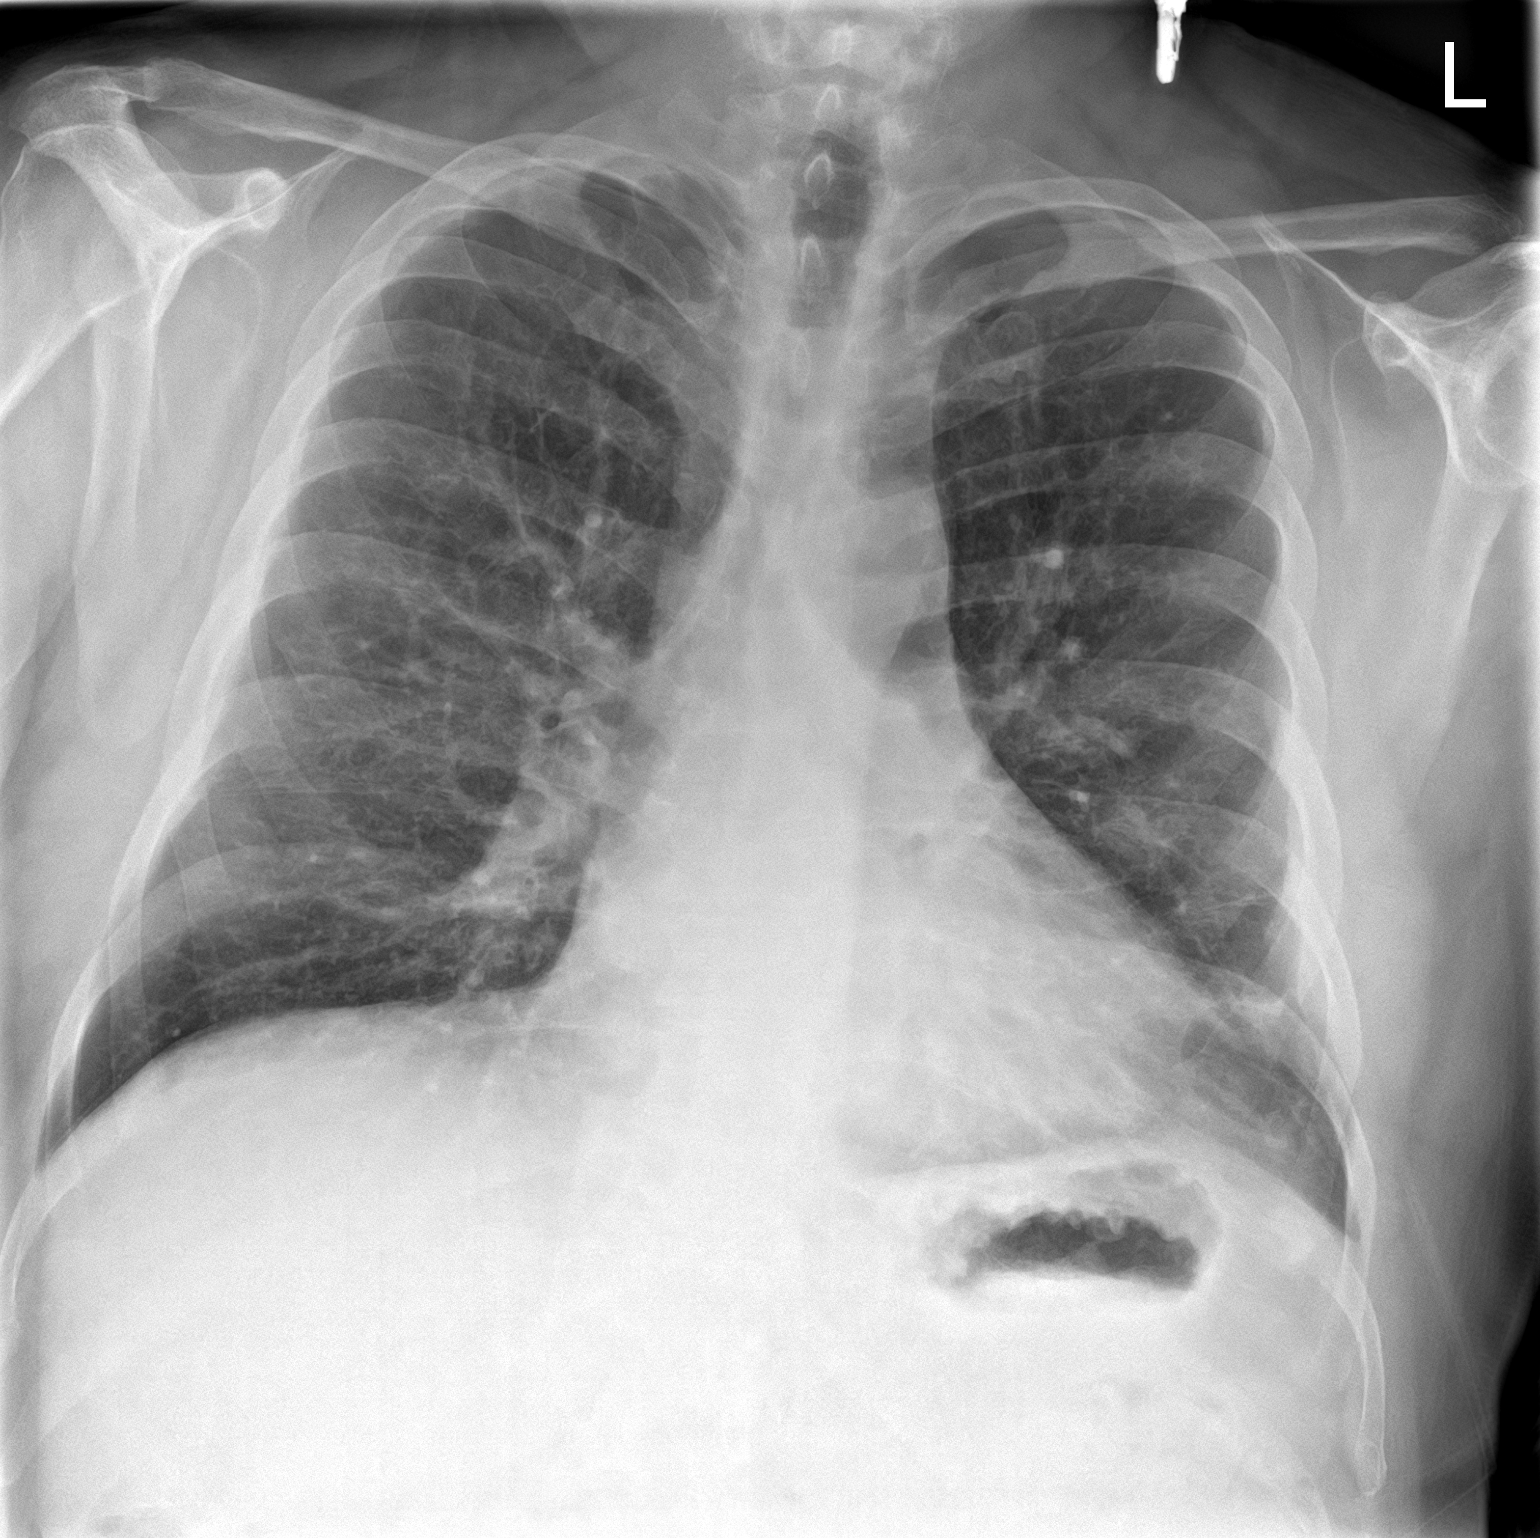

[chest lat]
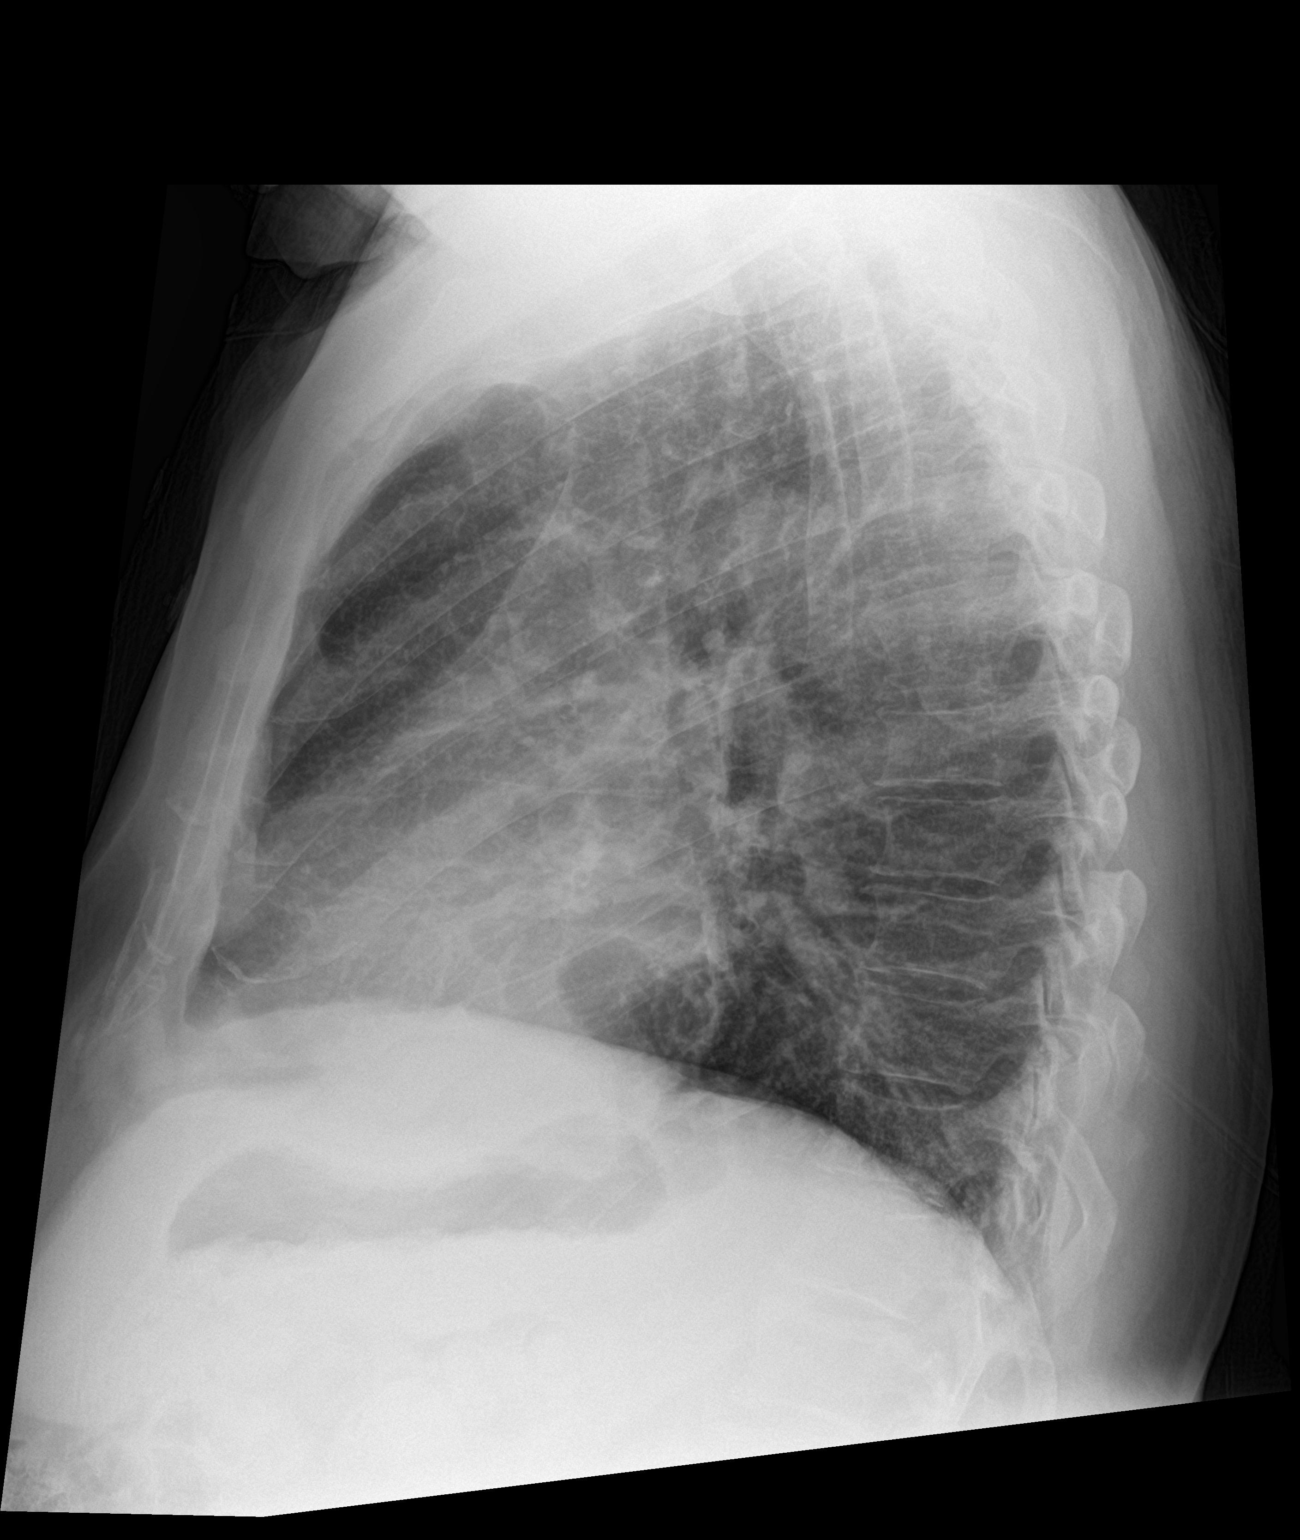

[2 of 2 positions shown; findings below may reference images not displayed]

FINDINGS: Heart size within normal limits. Similar appearance of ill-defined
and nodular opacities within the left lung base as compared to the
prior chest radiographs of [DATE]. No appreciable airspace
consolidation within the right lung. No evidence of pleural effusion
or pneumothorax. No acute bony abnormality identified.
IMPRESSION: Persistent ill-defined and nodular opacities within the left lung
base. These findings may reflect infection, scarring and/or
atelectasis. However, given the possibility of a pulmonary nodule, a
pulmonary neoplasm cannot be excluded, and a chest CT is recommended
for further characterization.

## 2021-05-31 NOTE — ED Triage Notes (Signed)
Pt reports that he developed mid sternal chest pain that developed after he started having a cough. Denies any radiation, N/V, SHOB or diaphoresis.

## 2021-06-01 ENCOUNTER — Emergency Department: Payer: Self-pay

## 2021-06-01 LAB — TROPONIN I (HIGH SENSITIVITY): Troponin I (High Sensitivity): 12 ng/L (ref <18)

## 2021-06-01 IMAGING — CT CT ANGIO CHEST
2 of 7 series · 18 of 46 positions shown · IV contrast (APPLIED)
Comparison: [DATE] CT chest, [DATE] CT abdomen pelvis

CLINICAL DATA: Chest pain

EXAM:
CT ANGIOGRAPHY CHEST WITH CONTRAST
TECHNIQUE: Multidetector CT imaging of the chest was performed using the
standard protocol during bolus administration of intravenous
contrast. Multiplanar CT image reconstructions and MIPs were
obtained to evaluate the vascular anatomy.
CONTRAST:  100mL OMNIPAQUE IOHEXOL 350 MG/ML SOLN

[Series 5: thins · axial · 0.77mm/px · z∈[-300,-45]mm · 15 of 354 slices shown]
[im 18/354  lung]
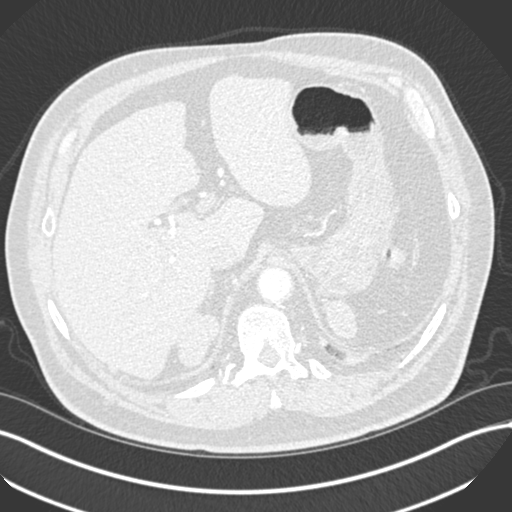
[im 36/354  soft-tissue]
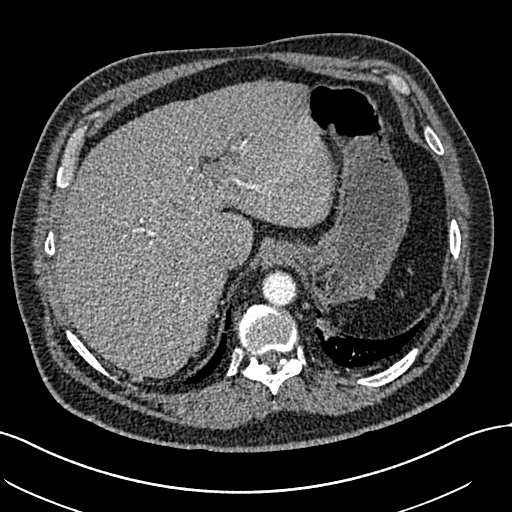
[im 71/354  lung]
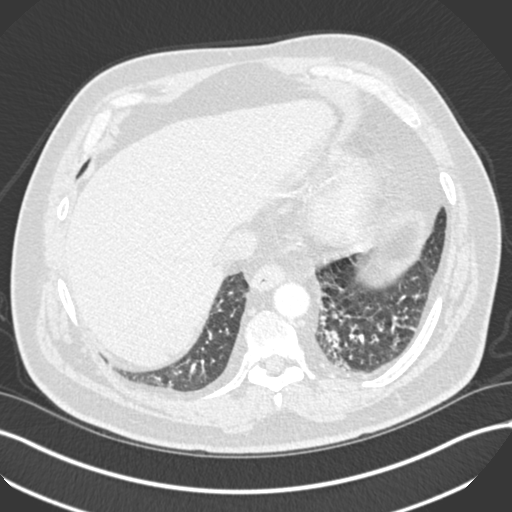
[im 89/354  soft-tissue]
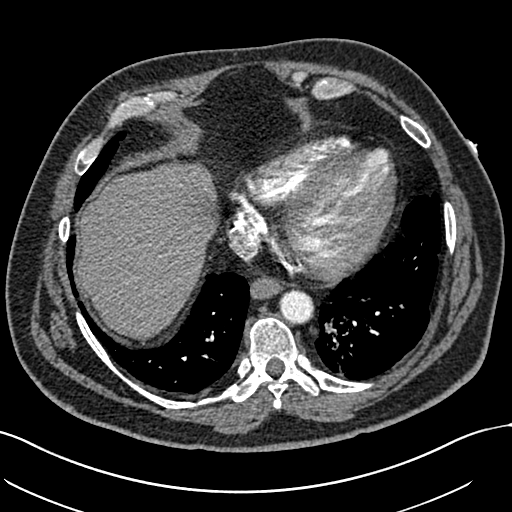
[im 106/354  lung]
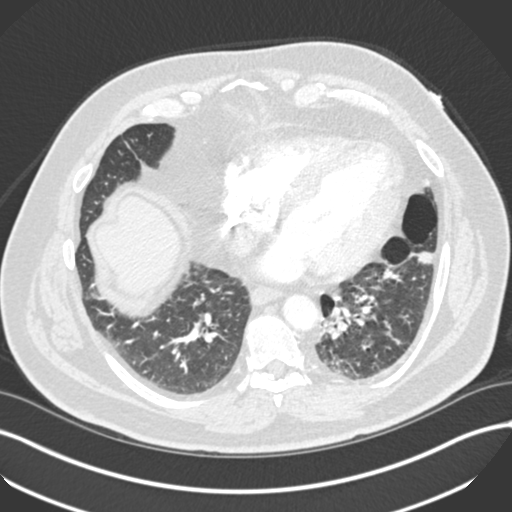
[im 124/354  soft-tissue]
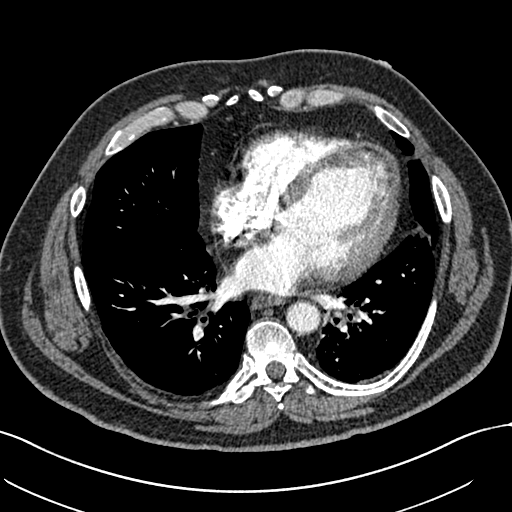
[im 159/354  lung]
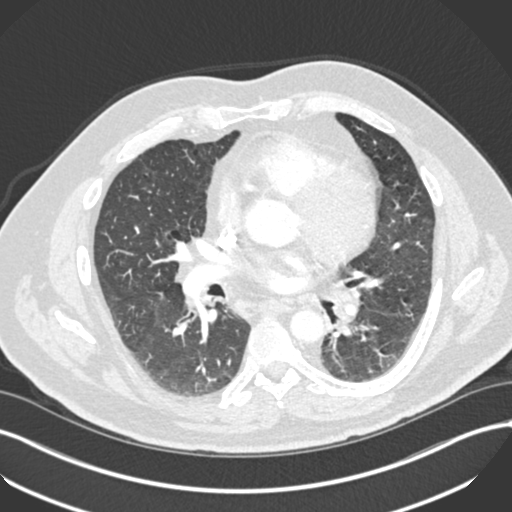
[im 177/354  soft-tissue]
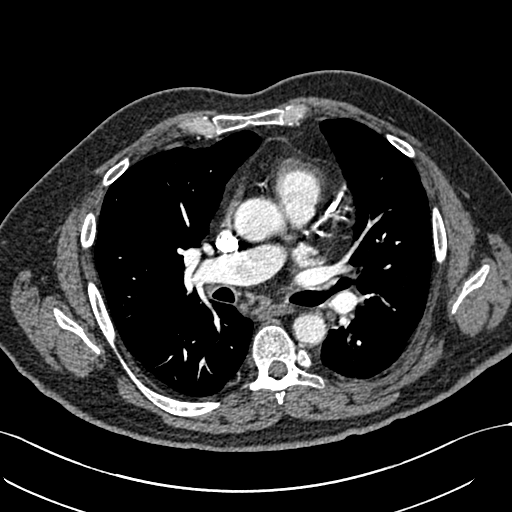
[im 195/354  lung]
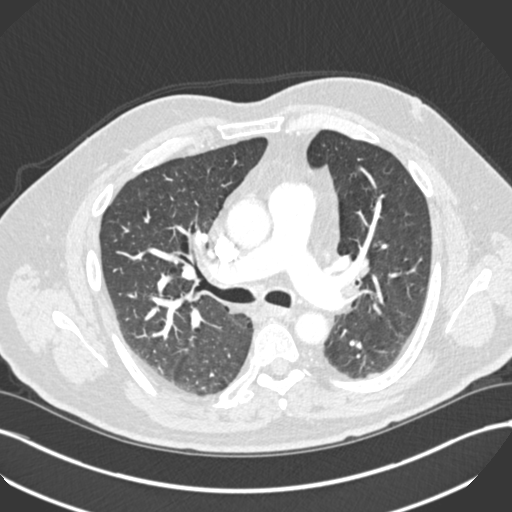
[im 230/354  soft-tissue]
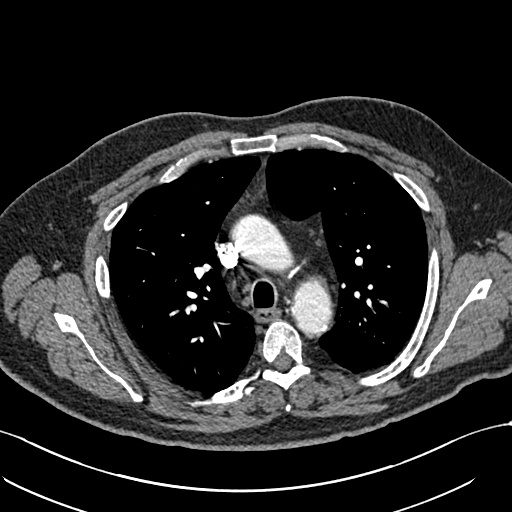
[im 248/354  lung]
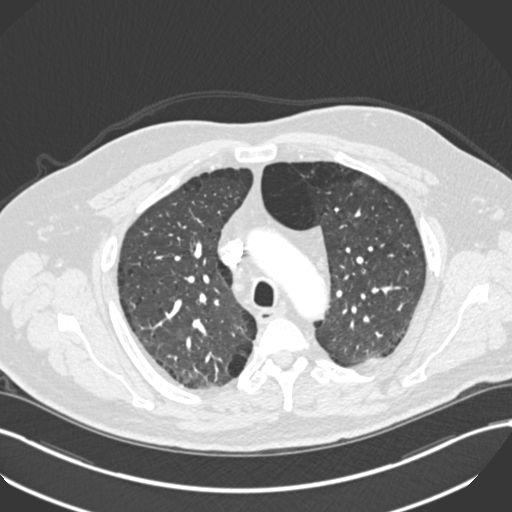
[im 265/354  soft-tissue]
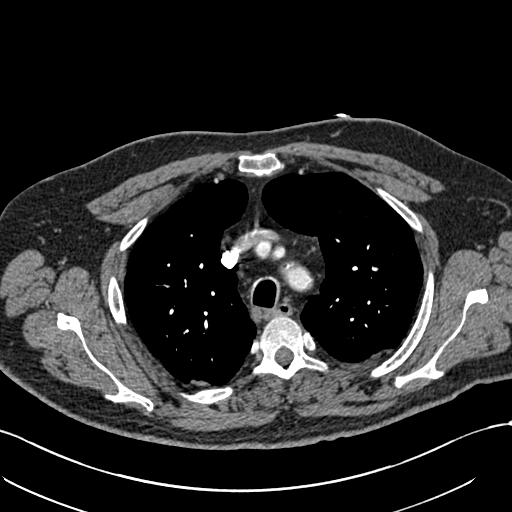
[im 283/354  lung]
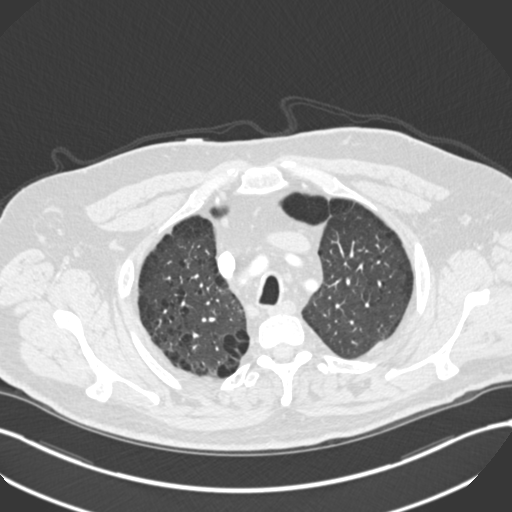
[im 318/354  soft-tissue]
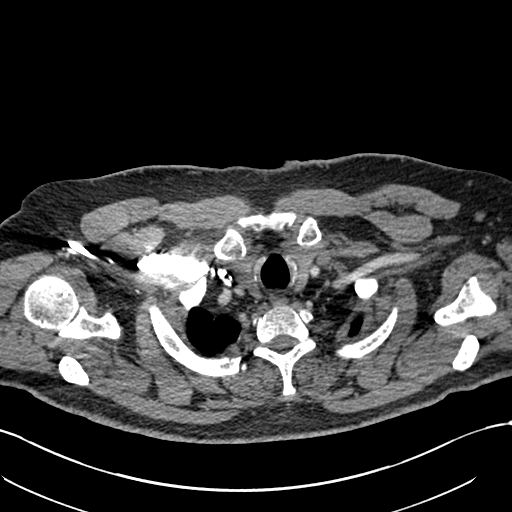
[im 336/354  lung]
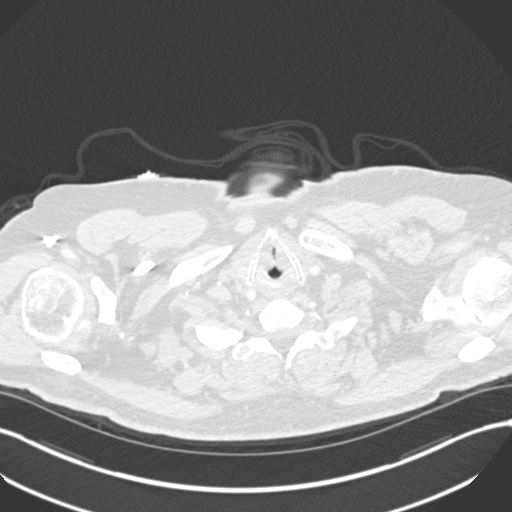

[Series 7: coronal mpr · coronal · 0.55mm/px · 3 of 101 slices shown]
[im 26/101  soft-tissue]
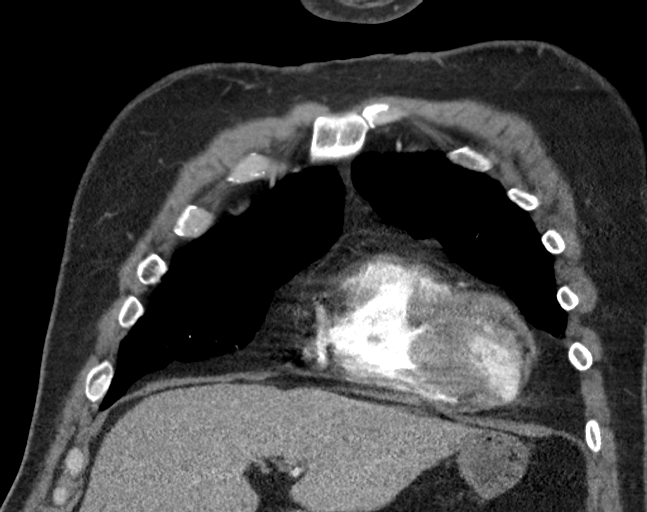
[im 51/101  soft-tissue]
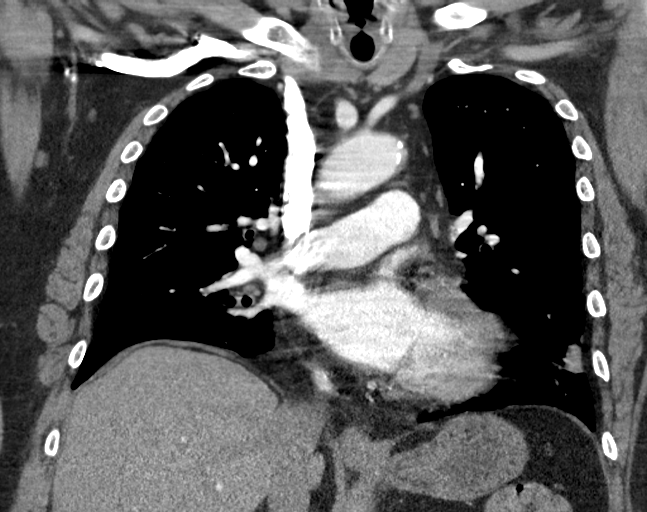
[im 76/101  soft-tissue]
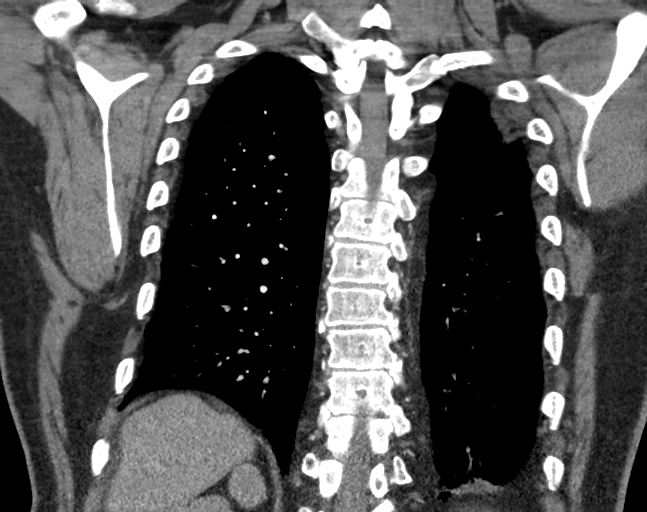

[18 of 46 positions shown; findings below may reference images not displayed]

FINDINGS: Cardiovascular: Contrast injection is sufficient to demonstrate
satisfactory opacification of the pulmonary arteries to the
segmental level. There is no pulmonary embolus or evidence of right
heart strain. The size of the main pulmonary artery is normal. Heart
size is normal, with no pericardial effusion. The course and caliber
of the aorta are normal. There is atherosclerotic calcification. No
acute aortic syndrome.

Mediastinum/Nodes: No mediastinal, hilar or axillary
lymphadenopathy. Normal visualized thyroid. Thoracic esophageal
course is normal.

Lungs/Pleura: Bilateral apical predominant emphysema. No pleural
effusion or focal airspace consolidation. 10 x 10 mm nodular opacity
within the lingula is again seen. This was present on [DATE].

Upper Abdomen: Contrast bolus timing is not optimized for evaluation
of the abdominal organs. Unchanged appearance of bilateral
suprarenal nodules, likely splenules.

Musculoskeletal: No chest wall abnormality. No bony spinal canal
stenosis.

Review of the MIP images confirms the above findings.
IMPRESSION: 1. No pulmonary embolus or other acute thoracic abnormality.
2. Unchanged appearance of 10 x 10 mm nodular opacity within the
lingula.

Aortic Atherosclerosis ([W7]-[W7]) and Emphysema ([W7]-[W7]).

## 2021-06-01 MED ORDER — KETOROLAC TROMETHAMINE 30 MG/ML IJ SOLN
15.0000 mg | Freq: Once | INTRAMUSCULAR | Status: AC
  Administered 2021-06-01: 15 mg via INTRAVENOUS
  Filled 2021-06-01: qty 1

## 2021-06-01 MED ORDER — CYCLOBENZAPRINE HCL 10 MG PO TABS
5.0000 mg | ORAL_TABLET | Freq: Once | ORAL | Status: AC
  Administered 2021-06-01: 5 mg via ORAL
  Filled 2021-06-01: qty 1

## 2021-06-01 MED ORDER — IOHEXOL 350 MG/ML SOLN
100.0000 mL | Freq: Once | INTRAVENOUS | Status: AC | PRN
  Administered 2021-06-01: 100 mL via INTRAVENOUS

## 2021-06-01 MED ORDER — PROPOFOL 500 MG/50ML IV EMUL
INTRAVENOUS | Status: AC
  Filled 2021-06-01: qty 50

## 2021-06-01 MED ORDER — CYCLOBENZAPRINE HCL 5 MG PO TABS: 5.0000 mg | ORAL_TABLET | Freq: Three times a day (TID) | ORAL | 0 refills | Status: DC | PRN

## 2021-06-01 MED ORDER — PROPOFOL 10 MG/ML IV BOLUS
INTRAVENOUS | Status: AC
  Filled 2021-06-01: qty 20

## 2021-06-01 MED ORDER — NIRMATRELVIR/RITONAVIR (PAXLOVID)TABLET: 3.0000 | ORAL_TABLET | Freq: Two times a day (BID) | ORAL | 0 refills | Status: AC

## 2021-06-01 NOTE — ED Provider Notes (Signed)
[facility] Emergency Department Provider Note   ____________________________________________   Event Date/Time   First MD Initiated Contact with Patient 06/01/21 0006     (approximate)  I have reviewed the triage vital signs and the nursing notes.   HISTORY  Chief Complaint Cough    HPI Brian Porter is a 55 y.o. male with past medical history of hypertension, CAD, CHF, and stroke presents to the ED.  Patient reports that he has been dealing with increasing nonproductive cough over the past 24 to 48 hours.  This been associated with subjective fevers, chills, mild difficulty breathing, and pain in his chest.  He has not noticed any pain or swelling in his legs, denies any nausea, vomiting, or abdominal pain.  He is not aware of any sick contacts, states he has received 2 doses of the COVID-19 vaccine.  He reports coming to the ED because he was concerned that simply related to his heart failure.        Past Medical History:  Diagnosis Date   CAD (coronary artery disease)    CHF (congestive heart failure) (HCC)    GERD (gastroesophageal reflux disease)    Hypertension    MI, old    Sleep apnea     Patient Active Problem List   Diagnosis Date Noted   Lumbar disc disease with radiculopathy 03/21/2021   Follow up 03/21/2021   Muscle pain 02/22/2021   Hospital discharge follow-up 11/16/2020   Intermittent claudication (HCC) 11/16/2020   CVA (cerebral vascular accident) (HCC) 10/21/2020   Prediabetes 10/05/2020   Leg pain 10/05/2020   CAP (community acquired pneumonia) 07/24/2018   Bilateral recurrent inguinal hernia without obstruction or gangrene    Acute on chronic systolic heart failure (HCC) 08/27/2016   HTN (hypertension) 11/02/2015   Tobacco abuse 11/02/2015   Hand joint pain 11/02/2015   CHF (congestive heart failure) (HCC) 10/15/2015    Past Surgical History:  Procedure Laterality Date   CARDIAC CATHETERIZATION Right 10/16/2015    Procedure: Left Heart Cath and Coronary Angiography;  Surgeon: Laurier Nancy, MD;  Location: [facility] INVASIVE CV LAB;  Service: Cardiovascular;  Laterality: Right;   CORONARY ANGIOPLASTY WITH STENT PLACEMENT  approx 2 years ago   HERNIA REPAIR Left 11/29/2016   [facility]   PARTIAL NEPHRECTOMY Left    SPLENECTOMY      Prior to Admission medications   Medication Sig Start Date End Date Taking? Authorizing Provider  nirmatrelvir/ritonavir EUA (PAXLOVID) 20 x 150 MG & 10 x 100MG  TABS Take 3 tablets by mouth 2 (two) times daily for 5 days. Patient GFR is >60. Take nirmatrelvir (150 mg) two tablets twice daily for 5 days and ritonavir (100 mg) one tablet twice daily for 5 days. 06/01/21 06/06/21 Yes 14/7/22, MD  aspirin 81 MG EC tablet Take 1 tablet (81 mg total) by mouth daily. 10/23/20   Elgergawy, 10/25/20, MD  atorvastatin (LIPITOR) 20 MG tablet Take 1 tablet by mouth once daily 04/10/21   Iloabachie, Chioma E, NP  empagliflozin (JARDIANCE) 10 MG TABS tablet Take 1 tablet (10 mg total) by mouth daily before breakfast. 05/29/21   05/31/21, FNP  fexofenadine-pseudoephedrine (ALLEGRA-D) 60-120 MG 12 hr tablet Take 1 tablet by mouth 2 (two) times daily. 04/14/21   04/16/21, PA-C  furosemide (LASIX) 40 MG tablet Take 0.5 tablets (20 mg total) by mouth daily. Patient taking differently: Take 40 mg by mouth daily. Patient taking one 40 mg tablet daily  10/23/20   Elgergawy, Leana Roe, MD  gabapentin (NEURONTIN) 100 MG capsule Take 1 capsule (100 mg total) by mouth 3 (three) times daily. 03/21/21   Kandyce Rud., MD  ibuprofen (ADVIL) 800 MG tablet Take 1 tablet (800 mg total) by mouth every 8 (eight) hours as needed for moderate pain. 04/14/21   Joni Reining, PA-C  Lidocaine (HM LIDOCAINE PATCH) 4 % PTCH Apply 1 patch topically every 12 (twelve) hours. Patient taking differently: Apply 1 patch topically every 12 (twelve) hours as needed. 10/05/20   Iloabachie, Chioma E, NP  losartan  (COZAAR) 50 MG tablet TAKE 1 TABLET BY MOUTH ONCE DAILY. DISCONTINUE ENTRESTO 11/14/20   Clarisa Kindred A, FNP  metoprolol succinate (TOPROL XL) 25 MG 24 hr tablet Take 1 tablet (25 mg total) by mouth daily. 12/04/20   Delma Freeze, FNP  orphenadrine (NORFLEX) 100 MG tablet Take 1 tablet (100 mg total) by mouth 2 (two) times daily. Patient not taking: Reported on 05/29/2021 03/02/21   Joni Reining, PA-C  potassium chloride (KLOR-CON) 10 MEQ tablet Take 0.5 tablets (5 mEq total) by mouth daily. 12/04/20   Delma Freeze, FNP  spironolactone (ALDACTONE) 25 MG tablet Take 1 tablet (25 mg total) by mouth daily. 10/23/20   Elgergawy, Leana Roe, MD    Allergies Sherryll Burger [sacubitril-valsartan]  Family History  Problem Relation Age of Onset   Hypertension Mother    Other Mother        covid pneumonia   Congestive Heart Failure Mother    Heart attack Father 43   Diabetes Father    Hypertension Father    Diabetes Mellitus II Brother     Social History Social History   Tobacco Use   Smoking status: Former    Packs/day: 0.50    Types: Cigarettes    Quit date: 10/24/2020    Years since quitting: 0.6   Smokeless tobacco: Never  Vaping Use   Vaping Use: Never used  Substance Use Topics   Alcohol use: No    Alcohol/week: 0.0 standard drinks   Drug use: Not Currently    Comment: percocet    Review of Systems  Constitutional: Positive for subjective fever/chills. Eyes: No visual changes. ENT: No sore throat. Cardiovascular: Positive for chest pain. Respiratory: Positive for cough and shortness of breath. Gastrointestinal: No abdominal pain.  No nausea, no vomiting.  No diarrhea.  No constipation. Genitourinary: Negative for dysuria. Musculoskeletal: Negative for back pain. Skin: Negative for rash. Neurological: Negative for headaches, focal weakness or numbness.  ____________________________________________   PHYSICAL EXAM:  VITAL SIGNS: ED Triage Vitals  Enc Vitals Group      BP 05/31/21 1558 127/68     Pulse Rate 05/31/21 1558 98     Resp 05/31/21 1558 20     Temp 05/31/21 1558 98.3 F (36.8 C)     Temp Source 05/31/21 1558 Oral     SpO2 05/31/21 1558 98 %     Weight 05/31/21 1556 240 lb (108.9 kg)     Height 05/31/21 1556 5\' 9"  (1.753 m)     Head Circumference --      Peak Flow --      Pain Score 05/31/21 1556 5     Pain Loc --      Pain Edu? --      Excl. in GC? --     Constitutional: Alert and oriented. Eyes: Conjunctivae are normal. Head: Atraumatic. Nose: No congestion/rhinnorhea. Mouth/Throat: Mucous membranes are moist.  Neck: Normal ROM Cardiovascular: Normal rate, regular rhythm. Grossly normal heart sounds.  2+ radial pulses bilaterally. Respiratory: Normal respiratory effort.  No retractions. Lungs CTAB.  No chest wall tenderness to palpation. Gastrointestinal: Soft and nontender. No distention. Genitourinary: deferred Musculoskeletal: No lower extremity tenderness nor edema. Neurologic:  Normal speech and language. No gross focal neurologic deficits are appreciated. Skin:  Skin is warm, dry and intact. No rash noted. Psychiatric: Mood and affect are normal. Speech and behavior are normal.  ____________________________________________   LABS (all labs ordered are listed, but only abnormal results are displayed)  Labs Reviewed  RESP PANEL BY RT-PCR (FLU A&B, COVID) ARPGX2 - Abnormal; Notable for the following components:      Result Value   SARS Coronavirus 2 by RT PCR POSITIVE (*)    All other components within normal limits  BASIC METABOLIC PANEL - Abnormal; Notable for the following components:   Glucose, Bld 123 (*)    All other components within normal limits  CBC - Abnormal; Notable for the following components:   WBC 18.3 (*)    All other components within normal limits  TROPONIN I (HIGH SENSITIVITY)  TROPONIN I (HIGH SENSITIVITY)   ____________________________________________  EKG  ED ECG REPORT I, Chesley Noon,  the attending physician, personally viewed and interpreted this ECG.   Date: 06/01/2021  EKG Time: 15:56  Rate: 98  Rhythm: normal sinus rhythm  Axis: RAD  Intervals:right bundle branch block  ST&T Change: None   PROCEDURES  Procedure(s) performed (including Critical Care):  Procedures   ____________________________________________   INITIAL IMPRESSION / ASSESSMENT AND PLAN / ED COURSE      55 year old male with past medical history of hypertension, CAD, CHF, and stroke who presents to the ED with 24 to 48 hours of nonproductive cough, not breathing chest pain, and subjective fevers and chills.  Symptoms are most consistent with a viral syndrome and patient is positive for COVID-19.  He is not in any respiratory distress and he is maintaining O2 sats on room air.  Labs are unremarkable, EKG shows no evidence of arrhythmia or ischemia and troponin is within normal limits.  Chest x-ray reviewed by me and shows mass versus infiltrate, we will further assess with CTA of his chest to rule out associated pneumonia or PE.  Patient is requesting pain medicine for "body aches" treated with IV Toradol along with Flexeril.  CTA is negative for PE or pneumonia, does demonstrate pulmonary nodule that is similar to previous.  Patient was advised of this finding and counseled to follow-up with his on CPAP.  He is appropriate for outpatient management of his COVID-19 and we will prescribe Paxlovid.  He was counseled to return to the ED for new worsening symptoms, patient agrees with plan.      ____________________________________________   FINAL CLINICAL IMPRESSION(S) / ED DIAGNOSES  Final diagnoses:  COVID-19  Shortness of breath     ED Discharge Orders          Ordered    nirmatrelvir/ritonavir EUA (PAXLOVID) 20 x 150 MG & 10 x 100MG  TABS  2 times daily        06/01/21 0344             Note:  This document was prepared using Dragon voice recognition software and may include  unintentional dictation errors.    14/02/22, MD 06/01/21 [id number]

## 2021-06-20 ENCOUNTER — Telehealth: Payer: Self-pay | Admitting: Family

## 2021-06-20 NOTE — Telephone Encounter (Signed)
Patient was approved for patient assistance for Jardiance till 06/20/22. LVM with patient with instructions on how to get them sent thru mail order.   Cathy Crounse, NT

## 2021-06-29 ENCOUNTER — Ambulatory Visit: Payer: Self-pay | Admitting: Family

## 2021-06-29 NOTE — Progress Notes (Deleted)
Patient ID: Brian Porter, male    DOB: Feb 05, 1966, 55 y.o.   MRN: [id number]   Brian Porter is a 55 y/o male with a history of obstructive sleep apnea (without CPAP), CAD, HTN, GERD, MI, current tobacco use and chronic heart failure.   Echo report from 10/22/20 reviewed and showed an EF of 25-30% along with mild Brian/AR. Echo report from 05/24/2019 reviewed and showed an EF of 30-35% along with trivial TR/PR. Echo done 10/15/15 and showed an EF of 35% along with moderate Brian/TR and an elevated PA pressure of 48 mm Hg.   Had a cardiac catheterization done 10/16/15 and showed an EF of 15% along with multi-vessel disease. Recommended treatment for cardiomyopathy.   Was in the ED 05/31/21 due to worsening cough and subjective fevers. Chest CTA negative for pneumonia. Found to be COVID +. Treated and released. Was in the ED 04/17/21 due to viral bronchitis where he was evaluated and released. Was in the ED 04/14/21 due to viral illness where he was evaluated and released.   He presents today for a follow-up visit with a chief complaint of   Past Medical History:  Diagnosis Date   CAD (coronary artery disease)    CHF (congestive heart failure) (HCC)    GERD (gastroesophageal reflux disease)    Hypertension    MI, old    Sleep apnea    Past Surgical History:  Procedure Laterality Date   CARDIAC CATHETERIZATION Right 10/16/2015   Procedure: Left Heart Cath and Coronary Angiography;  Surgeon: Laurier Nancy, MD;  Location: [facility] INVASIVE CV LAB;  Service: Cardiovascular;  Laterality: Right;   CORONARY ANGIOPLASTY WITH STENT PLACEMENT  approx 2 years ago   HERNIA REPAIR Left 11/29/2016   [facility]   PARTIAL NEPHRECTOMY Left    SPLENECTOMY     Family History  Problem Relation Age of Onset   Hypertension Mother    Other Mother        covid pneumonia   Congestive Heart Failure Mother    Heart attack Father 82   Diabetes Father    Hypertension Father    Diabetes Mellitus II Brother    Social History    Tobacco Use   Smoking status: Former    Packs/day: 0.50    Types: Cigarettes    Quit date: 10/24/2020    Years since quitting: 0.6   Smokeless tobacco: Never  Substance Use Topics   Alcohol use: No    Alcohol/week: 0.0 standard drinks   Allergies  Allergen Reactions   Entresto [Sacubitril-Valsartan] Cough     Review of Systems  Constitutional:  Negative for appetite change and fatigue.  HENT:  Negative for congestion, postnasal drip and sore throat.   Eyes: Negative.   Respiratory:  Positive for shortness of breath (minimal). Negative for cough and chest tightness.   Cardiovascular:  Negative for chest pain, palpitations and leg swelling.  Gastrointestinal:  Negative for abdominal distention.  Endocrine: Negative.   Genitourinary: Negative.   Musculoskeletal:  Positive for myalgias (right leg). Negative for back pain.  Skin: Negative.   Allergic/Immunologic: Negative.   Neurological:  Positive for dizziness ("rarely"). Negative for light-headedness.  Hematological:  Negative for adenopathy. Does not bruise/bleed easily.  Psychiatric/Behavioral:  Negative for dysphoric mood and sleep disturbance (sleeping on 2 pillows). The patient is not nervous/anxious.      Physical Exam Vitals and nursing note reviewed.  Constitutional:      Appearance: He is well-developed.  HENT:  Head: Normocephalic and atraumatic.  Neck:     Vascular: No JVD.  Cardiovascular:     Rate and Rhythm: Normal rate and regular rhythm.  Pulmonary:     Effort: Pulmonary effort is normal.     Breath sounds: No wheezing or rales.  Abdominal:     General: There is no distension.     Palpations: Abdomen is soft.     Tenderness: There is no abdominal tenderness.  Musculoskeletal:        General: No tenderness.     Cervical back: Normal range of motion and neck supple.  Skin:    General: Skin is warm and dry.  Neurological:     Mental Status: He is alert and oriented to person, place, and time.   Psychiatric:        Behavior: Behavior normal.        Thought Content: Thought content normal.      Assessment & Plan:   1: Chronic heart failure with reduced ejection fraction- - NYHA class II - euvolemic in appearance  - weighing daily. Reminded to call for an overnight weight gain of >2 pounds or a weekly weight gain of >5 pounds.  - weight 238.6 pounds since he was last here 1 month ago - currently on GDMT of losartan, metoprolol, jardiance & spironolactone - jardiance started at last visit - not adding salt to his food and tries to follow a 2000mg  sodium diet - BNP 07/24/2018 was 207.0 - has not gotten his flu vaccine yet  2: HTN- - BP  - saw provider at [facility] 03/21/21; returns 07/11/20 - reviewed BMP from 05/31/21 which showed sodium 137, potassium 3.9, creatinine 0.83 and GFR 101  3: Tobacco use- - stopped smoking 6 months ago - has gained 14 pounds during this time and he was encouraged to be mindful of what he's eating as well as the importance of increasing his activity - congratulated on that and continued cessation encouraged    Medication bottles reviewed.

## 2021-07-03 ENCOUNTER — Telehealth: Payer: Self-pay | Admitting: Family

## 2021-07-03 NOTE — Telephone Encounter (Signed)
Received phone call from patient saying that he's noticed diarrhea ever since he started jardiance. Says that he was feeling "great" before starting the medication and ever since he started it, he's had frequent stools.   Advised patient to stop the medication and see if GI symptoms improve.

## 2021-07-04 ENCOUNTER — Ambulatory Visit: Payer: Self-pay | Admitting: Family

## 2021-07-11 ENCOUNTER — Ambulatory Visit: Payer: Self-pay | Admitting: Gerontology

## 2021-07-11 ENCOUNTER — Encounter: Payer: Self-pay | Admitting: Gerontology

## 2021-07-11 ENCOUNTER — Other Ambulatory Visit: Payer: Self-pay

## 2021-07-11 VITALS — BP 126/78 | HR 75 | Temp 97.6°F | Resp 18 | Ht 69.0 in | Wt 239.0 lb

## 2021-07-11 DIAGNOSIS — E785 Hyperlipidemia, unspecified: Secondary | ICD-10-CM

## 2021-07-11 DIAGNOSIS — Z09 Encounter for follow-up examination after completed treatment for conditions other than malignant neoplasm: Secondary | ICD-10-CM

## 2021-07-11 MED ORDER — ATORVASTATIN CALCIUM 20 MG PO TABS: 20.0000 mg | ORAL_TABLET | Freq: Every day | ORAL | 1 refills | Status: DC

## 2021-07-11 NOTE — Progress Notes (Signed)
Established Patient Office Visit  Subjective:  Patient ID: Brian Porter, male    DOB: 07-26-1965  Age: 56 y.o. MRN: [id number]  CC:  Chief Complaint  Patient presents with   Follow-up    HPI Brian Porter is a 56 y/o male who has history of CAD, CHF, GERD, Hypertension, MI,presents for routine follow up visit. He was treated with Paxlovid at the ED for Covid on 05/31/21. Currently, he denies shortness of breath, cough and malaise. He's compliant with his medication, denies side effects and continues to make healthy lifestyle changes. Overall, he states that he's doing well and offers no further complaint.  Past Medical History:  Diagnosis Date   CAD (coronary artery disease)    CHF (congestive heart failure) (HCC)    GERD (gastroesophageal reflux disease)    Hypertension    MI, old    Sleep apnea     Past Surgical History:  Procedure Laterality Date   CARDIAC CATHETERIZATION Right 10/16/2015   Procedure: Left Heart Cath and Coronary Angiography;  Surgeon: Dionisio Oran, MD;  Location: [place] CV LAB;  Service: Cardiovascular;  Laterality: Right;   CORONARY ANGIOPLASTY WITH STENT PLACEMENT  approx 2 years ago   HERNIA REPAIR Left 11/29/2016   [facility]   PARTIAL NEPHRECTOMY Left    SPLENECTOMY      Family History  Problem Relation Age of Onset   Hypertension Mother    Other Mother        covid pneumonia   Congestive Heart Failure Mother    Heart attack Father 5   Diabetes Father    Hypertension Father    Diabetes Mellitus II Brother     Social History   Socioeconomic History   Marital status: Single    Spouse name: Not on file   Number of children: Not on file   Years of education: Not on file   Highest education level: Not on file  Occupational History   Occupation: unemployed  Tobacco Use   Smoking status: Former    Packs/day: 0.50    Types: Cigarettes    Quit date: 10/24/2020    Years since quitting: 0.7   Smokeless tobacco: Never  Vaping Use    Vaping Use: Never used  Substance and Sexual Activity   Alcohol use: No    Alcohol/week: 0.0 standard drinks   Drug use: Not Currently    Comment: percocet   Sexual activity: Not Currently  Other Topics Concern   Not on file  Social History Narrative   Not on file   Social Determinants of Health   Financial Resource Strain: Not on file  Food Insecurity: No Food Insecurity   Worried About Running Out of Food in the Last Year: Never true   Ran Out of Food in the Last Year: Never true  Transportation Needs: No Transportation Needs   Lack of Transportation (Medical): No   Lack of Transportation (Non-Medical): No  Physical Activity: Not on file  Stress: Not on file  Social Connections: Not on file  Intimate Partner Violence: Not on file    Outpatient Medications Prior to Visit  Medication Sig Dispense Refill   aspirin 81 MG EC tablet Take 1 tablet (81 mg total) by mouth daily. 30 tablet 2   furosemide (LASIX) 40 MG tablet Take 0.5 tablets (20 mg total) by mouth daily. (Patient taking differently: Take 40 mg by mouth daily. Patient taking one 40 mg tablet daily) 30 tablet 1   gabapentin (NEURONTIN)  100 MG capsule Take 1 capsule (100 mg total) by mouth 3 (three) times daily. 90 capsule 2   Lidocaine (HM LIDOCAINE PATCH) 4 % PTCH Apply 1 patch topically every 12 (twelve) hours. (Patient taking differently: Apply 1 patch topically every 12 (twelve) hours as needed.) 30 patch 0   losartan (COZAAR) 50 MG tablet TAKE 1 TABLET BY MOUTH ONCE DAILY. DISCONTINUE ENTRESTO 90 tablet 3   metoprolol succinate (TOPROL XL) 25 MG 24 hr tablet Take 1 tablet (25 mg total) by mouth daily. 90 tablet 3   potassium chloride (KLOR-CON) 10 MEQ tablet Take 0.5 tablets (5 mEq total) by mouth daily. 45 tablet 3   spironolactone (ALDACTONE) 25 MG tablet Take 1 tablet (25 mg total) by mouth daily. 30 tablet 2   atorvastatin (LIPITOR) 20 MG tablet Take 1 tablet by mouth once daily 90 tablet 0   cyclobenzaprine  (FLEXERIL) 5 MG tablet Take 1 tablet (5 mg total) by mouth 3 (three) times daily as needed. 12 tablet 0   empagliflozin (JARDIANCE) 10 MG TABS tablet Take 1 tablet (10 mg total) by mouth daily before breakfast. 30 tablet 5   fexofenadine-pseudoephedrine (ALLEGRA-D) 60-120 MG 12 hr tablet Take 1 tablet by mouth 2 (two) times daily. 20 tablet 0   ibuprofen (ADVIL) 800 MG tablet Take 1 tablet (800 mg total) by mouth every 8 (eight) hours as needed for moderate pain. 15 tablet 0   orphenadrine (NORFLEX) 100 MG tablet Take 1 tablet (100 mg total) by mouth 2 (two) times daily. (Patient not taking: Reported on 05/29/2021) 10 tablet 0   No facility-administered medications prior to visit.    Allergies  Allergen Reactions   Entresto [Sacubitril-Valsartan] Cough    ROS Review of Systems  Constitutional: Negative.   Eyes: Negative.   Respiratory: Negative.    Cardiovascular: Negative.   Neurological: Negative.   Psychiatric/Behavioral: Negative.       Objective:    Physical Exam HENT:     Head: Normocephalic and atraumatic.     Mouth/Throat:     Mouth: Mucous membranes are moist.  Eyes:     Extraocular Movements: Extraocular movements intact.     Conjunctiva/sclera: Conjunctivae normal.     Pupils: Pupils are equal, round, and reactive to light.  Cardiovascular:     Rate and Rhythm: Normal rate and regular rhythm.     Pulses: Normal pulses.     Heart sounds: Normal heart sounds.  Pulmonary:     Effort: Pulmonary effort is normal.     Breath sounds: Normal breath sounds.  Neurological:     General: No focal deficit present.     Mental Status: He is alert and oriented to person, place, and time. Mental status is at baseline.  Psychiatric:        Mood and Affect: Mood normal.        Behavior: Behavior normal.        Thought Content: Thought content normal.        Judgment: Judgment normal.    BP 126/78 (BP Location: Right Arm, Patient Position: Sitting, Cuff Size: Large)     Pulse 75    Temp 97.6 F (36.4 C) (Oral)    Resp 18    Ht '5\' 9"'  (1.753 m)    Wt 239 lb (108.4 kg)    SpO2 93%    BMI 35.29 kg/m  Wt Readings from Last 3 Encounters:  07/11/21 239 lb (108.4 kg)  05/31/21 240 lb (108.9 kg)  05/29/21  238 lb 6 oz (108.1 kg)   Encouraged weight loss   Health Maintenance Due  Topic Date Due   COVID-19 Vaccine (1) Never done   Pneumococcal Vaccine 38-72 Years old (1 - PCV) Never done   Hepatitis C Screening  Never done   TETANUS/TDAP  Never done   Zoster Vaccines- Shingrix (1 of 2) Never done   INFLUENZA VACCINE  Never done    There are no preventive care reminders to display for this patient.  Lab Results  Component Value Date   TSH 0.722 07/24/2018   Lab Results  Component Value Date   WBC 18.3 (H) 05/31/2021   HGB 14.3 05/31/2021   HCT 44.3 05/31/2021   MCV 96.9 05/31/2021   PLT 392 05/31/2021   Lab Results  Component Value Date   NA 137 05/31/2021   K 3.9 05/31/2021   CO2 27 05/31/2021   GLUCOSE 123 (H) 05/31/2021   BUN 18 05/31/2021   CREATININE 0.83 05/31/2021   BILITOT 0.7 10/21/2020   ALKPHOS 50 10/21/2020   AST 15 10/21/2020   ALT 18 10/21/2020   PROT 7.0 10/21/2020   ALBUMIN 3.7 10/21/2020   CALCIUM 9.1 05/31/2021   ANIONGAP 6 05/31/2021   EGFR 101 11/15/2020   Lab Results  Component Value Date   CHOL 114 11/15/2020   Lab Results  Component Value Date   HDL 33 (L) 11/15/2020   Lab Results  Component Value Date   LDLCALC 66 11/15/2020   Lab Results  Component Value Date   TRIG 71 11/15/2020   Lab Results  Component Value Date   CHOLHDL 3.5 11/15/2020   Lab Results  Component Value Date   HGBA1C 5.9 (H) 11/15/2020      Assessment & Plan:   1. Elevated lipids - He will continue on current medication, low fat/cholesterol diet. - atorvastatin (LIPITOR) 20 MG tablet; Take 1 tablet (20 mg total) by mouth daily.  Dispense: 90 tablet; Refill: 1  2. Encounter for follow-up - Routine labs will be  checked. - HgB A1c; Future - Lipid panel; Future - Comp Met (CMET); Future - CBC w/Diff; Future     Follow-up: Return in about 18 weeks (around 11/14/2021), or if symptoms worsen or fail to improve.    Carolyne Whitsel Jerold Coombe, NP

## 2021-07-11 NOTE — Patient Instructions (Signed)

## 2021-07-15 NOTE — Progress Notes (Signed)
Patient ID: Brian Porter, male    DOB: March 15, 1966, 56 y.o.   MRN: [id number]   Mr Emmick is a 56 y/o male with a history of obstructive sleep apnea (without CPAP), CAD, HTN, GERD, MI, current tobacco use and chronic heart failure.   Echo report from 10/22/20 reviewed and showed an EF of 25-30% along with mild MR/AR. Echo report from 05/24/2019 reviewed and showed an EF of 30-35% along with trivial TR/PR. Echo done 10/15/15 and showed an EF of 35% along with moderate MR/TR and an elevated PA pressure of 48 mm Hg.   Had a cardiac catheterization done 10/16/15 and showed an EF of 15% along with multi-vessel disease. Recommended treatment for cardiomyopathy.   Was in the ED 05/31/21 due to worsening cough and subjective fevers. Chest CTA negative for pneumonia. Found to be COVID +. Treated and released. Was in the ED 04/17/21 due to viral bronchitis where he was evaluated and released. Was in the ED 04/14/21 due to viral illness where he was evaluated and released.   He presents today for a follow-up visit with a chief complaint of minimal shortness of breath upon moderate exertion. He describes this as chronic in nature having been present for several years. He has associated intermittent dizziness along with this. He denies any difficulty sleeping, abdominal distention, palpitations, pedal edema, chest pain, cough, fatigue or weight gain.   Attempted to take jardiance but developed diarrhea and after a week, he stopped the jardiance and diarrhea resolved.   Just took his furosemide a little bit ago. Has been adding some salt and admits that he could be "doing better".   Past Medical History:  Diagnosis Date   CAD (coronary artery disease)    CHF (congestive heart failure) (HCC)    GERD (gastroesophageal reflux disease)    Hypertension    MI, old    Sleep apnea    Past Surgical History:  Procedure Laterality Date   CARDIAC CATHETERIZATION Right 10/16/2015   Procedure: Left Heart Cath and  Coronary Angiography;  Surgeon: Laurier Nancy, MD;  Location: [facility];  Service: Cardiovascular;  Laterality: Right;   CORONARY ANGIOPLASTY WITH STENT PLACEMENT  approx 2 years ago   HERNIA REPAIR Left 11/29/2016   [facility]   PARTIAL NEPHRECTOMY Left    SPLENECTOMY     Family History  Problem Relation Age of Onset   Hypertension Mother    Other Mother        covid pneumonia   Congestive Heart Failure Mother    Heart attack Father 59   Diabetes Father    Hypertension Father    Diabetes Mellitus II Brother    Social History   Tobacco Use   Smoking status: Former    Packs/day: 0.50    Types: Cigarettes    Quit date: 10/24/2020    Years since quitting: 0.7   Smokeless tobacco: Never  Substance Use Topics   Alcohol use: No    Alcohol/week: 0.0 standard drinks   Allergies  Allergen Reactions   Entresto [Sacubitril-Valsartan] Cough   Prior to Admission medications   Medication Sig Start Date End Date Taking? Authorizing Provider  aspirin 81 MG EC tablet Take 1 tablet (81 mg total) by mouth daily. 10/23/20  Yes Elgergawy, Leana Roe, MD  atorvastatin (LIPITOR) 20 MG tablet Take 1 tablet (20 mg total) by mouth daily. 07/11/21  Yes Iloabachie, Chioma E, NP  furosemide (LASIX) 40 MG tablet Take 0.5 tablets (20 mg total)  by mouth daily. Patient taking differently: Take 40 mg by mouth daily. Patient taking one 40 mg tablet daily 10/23/20  Yes Elgergawy, Leana Roeawood S, MD  gabapentin (NEURONTIN) 100 MG capsule Take 1 capsule (100 mg total) by mouth 3 (three) times daily. 03/21/21  Yes Kandyce RudKernodle, George W Jr., MD  Lidocaine (HM LIDOCAINE PATCH) 4 % PTCH Apply 1 patch topically every 12 (twelve) hours. Patient taking differently: Apply 1 patch topically every 12 (twelve) hours as needed. 10/05/20  Yes Iloabachie, Chioma E, NP  losartan (COZAAR) 50 MG tablet TAKE 1 TABLET BY MOUTH ONCE DAILY. DISCONTINUE ENTRESTO 11/14/20  Yes Clarisa KindredHackney, Hanna Aultman A, FNP  metoprolol succinate (TOPROL XL) 25 MG 24 hr  tablet Take 1 tablet (25 mg total) by mouth daily. 12/04/20  Yes Caddie Randle, Inetta Fermoina A, FNP  potassium chloride (KLOR-CON) 10 MEQ tablet Take 0.5 tablets (5 mEq total) by mouth daily. 12/04/20  Yes Clarisa KindredHackney, Chucky Homes A, FNP  spironolactone (ALDACTONE) 25 MG tablet Take 1 tablet (25 mg total) by mouth daily. 10/23/20  Yes Elgergawy, Leana Roeawood S, MD   Review of Systems  Constitutional:  Negative for appetite change and fatigue.  HENT:  Negative for congestion, postnasal drip and sore throat.   Eyes: Negative.   Respiratory:  Positive for shortness of breath (minimal). Negative for cough and chest tightness.   Cardiovascular:  Negative for chest pain, palpitations and leg swelling.  Gastrointestinal:  Negative for abdominal distention and abdominal pain.  Endocrine: Negative.   Genitourinary: Negative.   Musculoskeletal:  Positive for myalgias (right leg). Negative for back pain.  Skin: Negative.   Allergic/Immunologic: Negative.   Neurological:  Positive for dizziness ("rarely"). Negative for light-headedness.  Hematological:  Negative for adenopathy. Does not bruise/bleed easily.  Psychiatric/Behavioral:  Negative for dysphoric mood and sleep disturbance (sleeping on 2 pillows). The patient is not nervous/anxious.    Vitals:   07/16/21 1522  BP: 122/69  Pulse: 74  Resp: 18  SpO2: 97%  Weight: 239 lb (108.4 kg)  Height: 5\' 9"  (1.753 m)   Wt Readings from Last 3 Encounters:  07/16/21 239 lb (108.4 kg)  07/11/21 239 lb (108.4 kg)  05/31/21 240 lb (108.9 kg)   Lab Results  Component Value Date   CREATININE 0.83 05/31/2021   CREATININE 0.89 11/15/2020   CREATININE 0.63 10/21/2020   Physical Exam Vitals and nursing note reviewed.  Constitutional:      Appearance: He is well-developed.  HENT:     Head: Normocephalic and atraumatic.  Neck:     Vascular: No JVD.  Cardiovascular:     Rate and Rhythm: Normal rate and regular rhythm.  Pulmonary:     Effort: Pulmonary effort is normal.     Breath  sounds: No wheezing or rales.  Abdominal:     General: There is no distension.     Palpations: Abdomen is soft.     Tenderness: There is no abdominal tenderness.  Musculoskeletal:        General: No tenderness.     Cervical back: Normal range of motion and neck supple.     Right lower leg: Edema (1+ pitting) present.     Left lower leg: Edema (1+ pitting) present.  Skin:    General: Skin is warm and dry.  Neurological:     Mental Status: He is alert and oriented to person, place, and time.  Psychiatric:        Behavior: Behavior normal.        Thought Content: Thought content  normal.      Assessment & Plan:   1: Chronic heart failure with reduced ejection fraction- - NYHA class II - euvolemic in appearance  - weighing daily. Reminded to call for an overnight weight gain of >2 pounds or a weekly weight gain of >5 pounds.  - weight stable since he was last here 6 weeks ago - currently on GDMT of losartan, metoprolol & spironolactone - unable to tolerate entresto or jardiance; tolerated farxiga but wasn't approved for patient assistance so he can't afford to take it - has been adding a little salt and admits that he could be doing better with monitoring it; encouraged to not add any salt to his food; lower legs have some edema but he says that he just took his furosemide "not long ago" - BNP 07/24/2018 was 207.0  2: HTN- - BP looks good (122/69) - saw provider at [facility] 07/11/21; returns 11/15/21 - reviewed BMP from 05/31/21 which showed sodium 137, potassium 3.9, creatinine 0.83 and GFR 101   Patient did not bring his medications nor a list. Each medication was verbally reviewed with the patient and he was encouraged to bring the bottles to every visit to confirm accuracy of list.   Return in 5 months, sooner if needed

## 2021-07-16 ENCOUNTER — Ambulatory Visit: Payer: Self-pay | Attending: Family | Admitting: Family

## 2021-07-16 ENCOUNTER — Other Ambulatory Visit: Payer: Self-pay

## 2021-07-16 ENCOUNTER — Encounter: Payer: Self-pay | Admitting: Family

## 2021-07-16 VITALS — BP 122/69 | HR 74 | Resp 18 | Ht 69.0 in | Wt 239.0 lb

## 2021-07-16 DIAGNOSIS — G4733 Obstructive sleep apnea (adult) (pediatric): Secondary | ICD-10-CM | POA: Insufficient documentation

## 2021-07-16 DIAGNOSIS — Z91128 Patient's intentional underdosing of medication regimen for other reason: Secondary | ICD-10-CM | POA: Insufficient documentation

## 2021-07-16 DIAGNOSIS — Z8616 Personal history of COVID-19: Secondary | ICD-10-CM | POA: Insufficient documentation

## 2021-07-16 DIAGNOSIS — I5022 Chronic systolic (congestive) heart failure: Secondary | ICD-10-CM | POA: Insufficient documentation

## 2021-07-16 DIAGNOSIS — I10 Essential (primary) hypertension: Secondary | ICD-10-CM

## 2021-07-16 DIAGNOSIS — K219 Gastro-esophageal reflux disease without esophagitis: Secondary | ICD-10-CM | POA: Insufficient documentation

## 2021-07-16 DIAGNOSIS — F17210 Nicotine dependence, cigarettes, uncomplicated: Secondary | ICD-10-CM | POA: Insufficient documentation

## 2021-07-16 DIAGNOSIS — Z8249 Family history of ischemic heart disease and other diseases of the circulatory system: Secondary | ICD-10-CM | POA: Insufficient documentation

## 2021-07-16 DIAGNOSIS — I252 Old myocardial infarction: Secondary | ICD-10-CM | POA: Insufficient documentation

## 2021-07-16 DIAGNOSIS — Z79899 Other long term (current) drug therapy: Secondary | ICD-10-CM | POA: Insufficient documentation

## 2021-07-16 DIAGNOSIS — I429 Cardiomyopathy, unspecified: Secondary | ICD-10-CM | POA: Insufficient documentation

## 2021-07-16 DIAGNOSIS — I110 Hypertensive heart disease with heart failure: Secondary | ICD-10-CM | POA: Insufficient documentation

## 2021-07-16 DIAGNOSIS — I2510 Atherosclerotic heart disease of native coronary artery without angina pectoris: Secondary | ICD-10-CM | POA: Insufficient documentation

## 2021-07-16 NOTE — Patient Instructions (Signed)
Continue weighing daily and call for an overnight weight gain of 3 pounds or more or a weekly weight gain of more than 5 pounds.  ° °[facility] will be moving around the corner to [id number] mid-February. Our phone number will remain the same ° °

## 2021-08-08 ENCOUNTER — Other Ambulatory Visit: Payer: Self-pay | Admitting: Family

## 2021-08-08 MED ORDER — SPIRONOLACTONE 25 MG PO TABS: 25.0000 mg | ORAL_TABLET | Freq: Every day | ORAL | 3 refills | Status: DC

## 2021-08-08 MED ORDER — FUROSEMIDE 40 MG PO TABS: 40.0000 mg | ORAL_TABLET | Freq: Every day | ORAL | 3 refills | Status: DC

## 2021-08-08 NOTE — Progress Notes (Signed)
RX for furosemide and spironolactone sent to local pharmacy.

## 2021-09-26 ENCOUNTER — Other Ambulatory Visit: Payer: Self-pay

## 2021-09-26 ENCOUNTER — Encounter: Payer: Self-pay | Admitting: Gerontology

## 2021-09-26 ENCOUNTER — Ambulatory Visit: Payer: Self-pay | Admitting: Gerontology

## 2021-09-26 VITALS — BP 115/70 | HR 74 | Temp 97.5°F | Resp 18 | Ht 69.0 in | Wt 239.2 lb

## 2021-09-26 DIAGNOSIS — M5116 Intervertebral disc disorders with radiculopathy, lumbar region: Secondary | ICD-10-CM

## 2021-09-26 DIAGNOSIS — R42 Dizziness and giddiness: Secondary | ICD-10-CM | POA: Insufficient documentation

## 2021-09-26 MED ORDER — LOSARTAN POTASSIUM 25 MG PO TABS: 25.0000 mg | ORAL_TABLET | Freq: Every day | ORAL | 0 refills | Status: DC

## 2021-09-26 MED ORDER — GABAPENTIN 100 MG PO CAPS: 100.0000 mg | ORAL_CAPSULE | Freq: Three times a day (TID) | ORAL | 2 refills | Status: DC

## 2021-09-26 NOTE — Patient Instructions (Addendum)
Dizziness ?Dizziness is a common problem. It makes you feel unsteady or light-headed. You may feel like you are about to pass out (faint). Dizziness can lead to getting hurt if you stumble or fall. Dizziness can be caused by many things, including: ?Medicines. ?Not having enough water in your body (dehydration). ?Illness. ?Follow these instructions at home: ?Eating and drinking ? ?Drink enough fluid to keep your pee (urine) pale yellow. This helps to keep you from getting dehydrated. Try to drink more clear fluids, such as water. ?Do not drink alcohol. ?Limit how much caffeine you drink or eat, if your doctor tells you to do that. ?Limit how much salt (sodium) you drink or eat, if your doctor tells you to do that. ?Activity ?pibNameDASH Eating Plan ?DASH stands for Dietary Approaches to Stop Hypertension. The DASH eating plan is a healthy eating plan that has been shown to: ?Reduce high blood pressure (hypertension). ?Reduce your risk for type 2 diabetes, heart disease, and stroke. ?Help with weight loss. ?What are tips for following this plan? ?Reading food labels ?Check food labels for the amount of salt (sodium) per serving. Choose foods with less than 5 percent of the Daily Value of sodium. Generally, foods with less than 300 milligrams (mg) of sodium per serving fit into this eating plan. ?To find whole grains, look for the word "whole" as the first word in the ingredient list. ?Shopping ?Buy products labeled as "low-sodium" or "no salt added." ?Buy fresh foods. Avoid canned foods and pre-made or frozen meals. ?Cooking ?Avoid adding salt when cooking. Use salt-free seasonings or herbs instead of table salt or sea salt. Check with your health care provider or pharmacist before using salt substitutes. ?Do not fry foods. Cook foods using healthy methods such as baking, boiling, grilling, roasting, and broiling instead. ?Cook with heart-healthy oils, such as olive, canola, avocado, soybean, or sunflower oil. ?Meal  planning ? ?Eat a balanced diet that includes: ?4 or more servings of fruits and 4 or more servings of vegetables each day. Try to fill one-half of your plate with fruits and vegetables. ?6-8 servings of whole grains each day. ?Less than 6 oz (170 g) of lean meat, poultry, or fish each day. A 3-oz (85-g) serving of meat is about the same size as a deck of cards. One egg equals 1 oz (28 g). ?2-3 servings of low-fat dairy each day. One serving is 1 cup (237 mL). ?1 serving of nuts, seeds, or beans 5 times each week. ?2-3 servings of heart-healthy fats. Healthy fats called omega-3 fatty acids are found in foods such as walnuts, flaxseeds, fortified milks, and eggs. These fats are also found in cold-water fish, such as sardines, salmon, and mackerel. ?Limit how much you eat of: ?Canned or prepackaged foods. ?Food that is high in trans fat, such as some fried foods. ?Food that is high in saturated fat, such as fatty meat. ?Desserts and other sweets, sugary drinks, and other foods with added sugar. ?Full-fat dairy products. ?Do not salt foods before eating. ?Do not eat more than 4 egg yolks a week. ?Try to eat at least 2 vegetarian meals a week. ?Eat more home-cooked food and less restaurant, buffet, and fast food. ?Lifestyle ?When eating at a restaurant, ask that your food be prepared with less salt or no salt, if possible. ?If you drink alcohol: ?Limit how much you use to: ?0-1 drink a day for women who are not pregnant. ?0-2 drinks a day for men. ?Be aware of how much  alcohol is in your drink. In the [place] one drink equals one 12 oz bottle of beer (355 mL), one 5 oz glass of wine (148 mL), or one 1? oz glass of hard liquor (44 mL). ?General information ?Avoid eating more than 2,300 mg of salt a day. If you have hypertension, you may need to reduce your sodium intake to 1,500 mg a day. ?Work with your health care provider to maintain a healthy body weight or to lose weight. Ask what an ideal weight is for you. ?Get at  least 30 minutes of exercise that causes your heart to beat faster (aerobic exercise) most days of the week. Activities may include walking, swimming, or biking. ?Work with your health care provider or dietitian to adjust your eating plan to your individual calorie needs. ?What foods should I eat? ?Fruits ?All fresh, dried, or frozen fruit. Canned fruit in natural juice (without added sugar). ?Vegetables ?Fresh or frozen vegetables (raw, steamed, roasted, or grilled). Low-sodium or reduced-sodium tomato and vegetable juice. Low-sodium or reduced-sodium tomato sauce and tomato paste. Low-sodium or reduced-sodium canned vegetables. ?Grains ?Whole-grain or whole-wheat bread. Whole-grain or whole-wheat pasta. Brown rice. Modena Morrow. Bulgur. Whole-grain and low-sodium cereals. Pita bread. Low-fat, low-sodium crackers. Whole-wheat flour tortillas. ?Meats and other proteins ?Skinless chicken or [place]. Ground chicken or [place]. Pork with fat trimmed off. Fish and seafood. Egg whites. Dried beans, peas, or lentils. Unsalted nuts, nut butters, and seeds. Unsalted canned beans. Lean cuts of beef with fat trimmed off. Low-sodium, lean precooked or cured meat, such as sausages or meat loaves. ?Dairy ?Low-fat (1%) or fat-free (skim) milk. Reduced-fat, low-fat, or fat-free cheeses. Nonfat, low-sodium ricotta or cottage cheese. Low-fat or nonfat yogurt. Low-fat, low-sodium cheese. ?Fats and oils ?Soft margarine without trans fats. Vegetable oil. Reduced-fat, low-fat, or light mayonnaise and salad dressings (reduced-sodium). Canola, safflower, olive, avocado, soybean, and sunflower oils. Avocado. ?Seasonings and condiments ?Herbs. Spices. Seasoning mixes without salt. ?Other foods ?Unsalted popcorn and pretzels. Fat-free sweets. ?The items listed above may not be a complete list of foods and beverages you can eat. Contact a dietitian for more information. ?What foods should I avoid? ?Fruits ?Canned fruit in a light or heavy  syrup. Fried fruit. Fruit in cream or butter sauce. ?Vegetables ?Creamed or fried vegetables. Vegetables in a cheese sauce. Regular canned vegetables (not low-sodium or reduced-sodium). Regular canned tomato sauce and paste (not low-sodium or reduced-sodium). Regular tomato and vegetable juice (not low-sodium or reduced-sodium). Angie Fava. Olives. ?Grains ?Baked goods made with fat, such as croissants, muffins, or some breads. Dry pasta or rice meal packs. ?Meats and other proteins ?Fatty cuts of meat. Ribs. Fried meat. Berniece Salines. Bologna, salami, and other precooked or cured meats, such as sausages or meat loaves. Fat from the back of a pig (fatback). Bratwurst. Salted nuts and seeds. Canned beans with added salt. Canned or smoked fish. Whole eggs or egg yolks. Chicken or [place] with skin. ?Dairy ?Whole or 2% milk, cream, and half-and-half. Whole or full-fat cream cheese. Whole-fat or sweetened yogurt. Full-fat cheese. Nondairy creamers. Whipped toppings. Processed cheese and cheese spreads. ?Fats and oils ?Butter. Stick margarine. Lard. Shortening. Ghee. Bacon fat. Tropical oils, such as coconut, palm kernel, or palm oil. ?Seasonings and condiments ?Onion salt, garlic salt, seasoned salt, table salt, and sea salt. Worcestershire sauce. Tartar sauce. Barbecue sauce. Teriyaki sauce. Soy sauce, including reduced-sodium. Steak sauce. Canned and packaged gravies. Fish sauce. Oyster sauce. Cocktail sauce. Store-bought horseradish. Ketchup. Mustard. Meat flavorings and tenderizers. Bouillon cubes. Hot sauces. Pre-made  or packaged marinades. Pre-made or packaged taco seasonings. Relishes. Regular salad dressings. ?Other foods ?Salted popcorn and pretzels. ?The items listed above may not be a complete list of foods and beverages you should avoid. Contact a dietitian for more information. ?Where to find more information ?National Heart, Lung, and Blood Institute: https://wilson-eaton.com/ ?American Heart Association:  www.heart.org ?Academy of Nutrition and Dietetics: www.eatright.org ?[place]: www.kidney.org ?Summary ?The DASH eating plan is a healthy eating plan that has been shown to reduce high blood pressure (hype

## 2021-09-26 NOTE — Progress Notes (Signed)
? ?Established Patient Office Visit ? ?Subjective:  ?Patient ID: Brian Porter, male    DOB: July 04, 1965  Age: 56 y.o. MRN: [id number] ? ?CC: Dizziness  ? ?HPI ?Brian Porter is a 56 y/o male who has history of CAD, CHF, GERD, Hypertension, MI, presents with a concern about the fluctuation of his blood pressure and light headedness that has been going on for a while and medication refill. He states that he is compliant with his medications and exercises as much as possible.  He said he checks his blood pressure twice a day at home and his readings in the afternoon are usually in the low 100's/60 after taking 50 mg Losartan and 25 mg Metoprolol. He reports that the light headedness starts approximately 1-2 hours after taking both medication in the afternoon and his blood pressure will be in the low 100's/60. He states that light headedness resolves around 7-8 pm. He denies any fall or vision changes. He denies any pain , fatigue, chills and N/V. He states that he is overall doing well and denies any other complaints. ? ?Past Medical History:  ?Diagnosis Date  ? CAD (coronary artery disease)   ? CHF (congestive heart failure) ([place])   ? GERD (gastroesophageal reflux disease)   ? Hypertension   ? MI, old   ? Sleep apnea   ? ? ?Past Surgical History:  ?Procedure Laterality Date  ? CARDIAC CATHETERIZATION Right 10/16/2015  ? Procedure: Left Heart Cath and Coronary Angiography;  Surgeon: Dionisio Kit, MD;  Location: [facility];  Service: Cardiovascular;  Laterality: Right;  ? CORONARY ANGIOPLASTY WITH STENT PLACEMENT  approx 2 years ago  ? HERNIA REPAIR Left 11/29/2016  ? [facility]  ? PARTIAL NEPHRECTOMY Left   ? SPLENECTOMY    ? ? ?Family History  ?Problem Relation Age of Onset  ? Hypertension Mother   ? Other Mother   ?     covid pneumonia  ? Congestive Heart Failure Mother   ? Heart attack Father 15  ? Diabetes Father   ? Hypertension Father   ? Diabetes Mellitus II Brother   ? ? ?Social History  ? ?Socioeconomic  History  ? Marital status: Single  ?  Spouse name: Not on file  ? Number of children: Not on file  ? Years of education: Not on file  ? Highest education level: Not on file  ?Occupational History  ? Occupation: unemployed  ?Tobacco Use  ? Smoking status: Former  ?  Packs/day: 0.50  ?  Types: Cigarettes  ?  Quit date: 10/24/2020  ?  Years since quitting: 0.9  ? Smokeless tobacco: Never  ?Vaping Use  ? Vaping Use: Never used  ?Substance and Sexual Activity  ? Alcohol use: No  ?  Alcohol/week: 0.0 standard drinks  ? Drug use: Not Currently  ?  Comment: percocet  ? Sexual activity: Not Currently  ?Other Topics Concern  ? Not on file  ?Social History Narrative  ? Not on file  ? ?Social Determinants of Health  ? ?Financial Resource Strain: Not on file  ?Food Insecurity: No Food Insecurity  ? Worried About Charity fundraiser in the Last Year: Never true  ? Ran Out of Food in the Last Year: Never true  ?Transportation Needs: No Transportation Needs  ? Lack of Transportation (Medical): No  ? Lack of Transportation (Non-Medical): No  ?Physical Activity: Not on file  ?Stress: Not on file  ?Social Connections: Not on file  ?Intimate  Partner Violence: Not on file  ? ? ?Outpatient Medications Prior to Visit  ?Medication Sig Dispense Refill  ? aspirin 81 MG EC tablet Take 1 tablet (81 mg total) by mouth daily. 30 tablet 2  ? atorvastatin (LIPITOR) 20 MG tablet Take 1 tablet (20 mg total) by mouth daily. 90 tablet 1  ? furosemide (LASIX) 40 MG tablet Take 1 tablet (40 mg total) by mouth daily. 90 tablet 3  ? Lidocaine (HM LIDOCAINE PATCH) 4 % PTCH Apply 1 patch topically every 12 (twelve) hours. (Patient taking differently: Apply 1 patch topically every 12 (twelve) hours as needed.) 30 patch 0  ? metoprolol succinate (TOPROL XL) 25 MG 24 hr tablet Take 1 tablet (25 mg total) by mouth daily. 90 tablet 3  ? potassium chloride (KLOR-CON) 10 MEQ tablet Take 0.5 tablets (5 mEq total) by mouth daily. 45 tablet 3  ? spironolactone  (ALDACTONE) 25 MG tablet Take 1 tablet (25 mg total) by mouth daily. 90 tablet 3  ? gabapentin (NEURONTIN) 100 MG capsule Take 1 capsule (100 mg total) by mouth 3 (three) times daily. 90 capsule 2  ? losartan (COZAAR) 50 MG tablet TAKE 1 TABLET BY MOUTH ONCE DAILY. DISCONTINUE ENTRESTO 90 tablet 3  ? ?No facility-administered medications prior to visit.  ? ? ?Allergies  ?Allergen Reactions  ? Jardiance [Empagliflozin] Diarrhea  ? Entresto [Sacubitril-Valsartan] Cough  ? ? ?ROS ?Review of Systems  ?Constitutional: Negative.   ?HENT: Negative.    ?Respiratory: Negative.    ?Cardiovascular: Negative.   ?Gastrointestinal: Negative.   ?Endocrine: Negative.   ?Musculoskeletal: Negative.   ?Skin: Negative.   ?Neurological:  Positive for light-headedness (Intermittent). Negative for dizziness.  ?Psychiatric/Behavioral: Negative.    ? ?  ?Objective:  ?  ?Physical Exam ?Constitutional:   ?   Appearance: Normal appearance.  ?HENT:  ?   Head: Normocephalic.  ?Cardiovascular:  ?   Rate and Rhythm: Normal rate and regular rhythm.  ?Pulmonary:  ?   Breath sounds: Normal breath sounds.  ?Abdominal:  ?   Palpations: Abdomen is soft.  ?Musculoskeletal:     ?   General: Normal range of motion.  ?   Cervical back: Normal range of motion.  ?Neurological:  ?   Mental Status: He is alert and oriented to person, place, and time.  ?Psychiatric:     ?   Mood and Affect: Mood normal.  ? ? ?BP 115/70 (BP Location: Right Arm, Patient Position: Sitting, Cuff Size: Large)   Pulse 74   Temp (!) 97.5 ?F (36.4 ?C) (Oral)   Resp 18   Ht '5\' 9"'  (1.753 m)   Wt 239 lb 3.2 oz (108.5 kg)   SpO2 95%   BMI 35.32 kg/m?  ?Wt Readings from Last 3 Encounters:  ?09/26/21 239 lb 3.2 oz (108.5 kg)  ?07/16/21 239 lb (108.4 kg)  ?07/11/21 239 lb (108.4 kg)  ? ?Encouraged weight loss ? ?Health Maintenance Due  ?Topic Date Due  ? COVID-19 Vaccine (1) Never done  ? Hepatitis C Screening  Never done  ? TETANUS/TDAP  Never done  ? Zoster Vaccines- Shingrix (1 of 2)  Never done  ? ? ?There are no preventive care reminders to display for this patient. ? ?Lab Results  ?Component Value Date  ? TSH 0.722 07/24/2018  ? ?Lab Results  ?Component Value Date  ? WBC 18.3 (H) 05/31/2021  ? HGB 14.3 05/31/2021  ? HCT 44.3 05/31/2021  ? MCV 96.9 05/31/2021  ? PLT 392 05/31/2021  ? ?  Lab Results  ?Component Value Date  ? NA 137 05/31/2021  ? K 3.9 05/31/2021  ? CO2 27 05/31/2021  ? GLUCOSE 123 (H) 05/31/2021  ? BUN 18 05/31/2021  ? CREATININE 0.83 05/31/2021  ? BILITOT 0.7 10/21/2020  ? ALKPHOS 50 10/21/2020  ? AST 15 10/21/2020  ? ALT 18 10/21/2020  ? PROT 7.0 10/21/2020  ? ALBUMIN 3.7 10/21/2020  ? CALCIUM 9.1 05/31/2021  ? ANIONGAP 6 05/31/2021  ? EGFR 101 11/15/2020  ? ?Lab Results  ?Component Value Date  ? CHOL 114 11/15/2020  ? ?Lab Results  ?Component Value Date  ? HDL 33 (L) 11/15/2020  ? ?Lab Results  ?Component Value Date  ? Rembert 66 11/15/2020  ? ?Lab Results  ?Component Value Date  ? TRIG 71 11/15/2020  ? ?Lab Results  ?Component Value Date  ? CHOLHDL 3.5 11/15/2020  ? ?Lab Results  ?Component Value Date  ? HGBA1C 5.9 (H) 11/15/2020  ? ? ?  ?Assessment & Plan:  ? ?1. Light-headedness ?- His lightheadedness might be due to hypotension. His Losartan was decreased from 50 mg daily to 25 mg daily, continue other medications.  ?He was advised to continue checking his blood pressure at home, record and bring log to the next appointment along with his blood pressure machine. ?-He was advised to take increase fluid intake and continue on DASH diet. ?-He was advised to change position slowly and to go to the ED for worsening symptoms. ?  ? ? ? ? ?Follow-up: in the clinic 2 weeks (around 10/10/21) or if the symptoms worsen or fail to Improve. ? ?Chioma Jerold Coombe, NP ?

## 2021-10-04 ENCOUNTER — Emergency Department
Admission: EM | Admit: 2021-10-04 | Discharge: 2021-10-05 | Disposition: A | Discharge status: Home / Self Care | Payer: Self-pay | Attending: Emergency Medicine | Admitting: Emergency Medicine

## 2021-10-04 ENCOUNTER — Other Ambulatory Visit: Payer: Self-pay

## 2021-10-04 ENCOUNTER — Encounter: Payer: Self-pay | Admitting: Emergency Medicine

## 2021-10-04 ENCOUNTER — Emergency Department: Payer: Self-pay

## 2021-10-04 DIAGNOSIS — Z20822 Contact with and (suspected) exposure to covid-19: Secondary | ICD-10-CM | POA: Insufficient documentation

## 2021-10-04 DIAGNOSIS — I110 Hypertensive heart disease with heart failure: Secondary | ICD-10-CM | POA: Insufficient documentation

## 2021-10-04 DIAGNOSIS — I2510 Atherosclerotic heart disease of native coronary artery without angina pectoris: Secondary | ICD-10-CM | POA: Insufficient documentation

## 2021-10-04 DIAGNOSIS — M549 Dorsalgia, unspecified: Secondary | ICD-10-CM | POA: Insufficient documentation

## 2021-10-04 DIAGNOSIS — R42 Dizziness and giddiness: Secondary | ICD-10-CM | POA: Insufficient documentation

## 2021-10-04 DIAGNOSIS — I509 Heart failure, unspecified: Secondary | ICD-10-CM | POA: Insufficient documentation

## 2021-10-04 DIAGNOSIS — M791 Myalgia, unspecified site: Secondary | ICD-10-CM | POA: Insufficient documentation

## 2021-10-04 LAB — BASIC METABOLIC PANEL
Anion gap: 9 (ref 5–15)
BUN: 21 mg/dL — ABNORMAL HIGH (ref 6–20)
CO2: 24 mmol/L (ref 22–32)
Calcium: 8.7 mg/dL — ABNORMAL LOW (ref 8.9–10.3)
Chloride: 104 mmol/L (ref 98–111)
Creatinine, Ser: 0.92 mg/dL (ref 0.61–1.24)
GFR, Estimated: >60 mL/min (ref >60)
Glucose, Bld: 115 mg/dL — ABNORMAL HIGH (ref 70–99)
Potassium: 3.6 mmol/L (ref 3.5–5.1)
Sodium: 137 mmol/L (ref 135–145)

## 2021-10-04 LAB — URINALYSIS, ROUTINE W REFLEX MICROSCOPIC
Bacteria, UA: NONE SEEN
Bilirubin Urine: NEGATIVE
Glucose, UA: NEGATIVE mg/dL
Hgb urine dipstick: NEGATIVE
Ketones, ur: NEGATIVE mg/dL
Nitrite: NEGATIVE
Protein, ur: NEGATIVE mg/dL
Specific Gravity, Urine: 1.008 (ref 1.005–1.030)
pH: 6 (ref 5.0–8.0)

## 2021-10-04 LAB — CBC
HCT: 41.1 % (ref 39.0–52.0)
Hemoglobin: 13.8 g/dL (ref 13.0–17.0)
MCH: 31.5 pg (ref 26.0–34.0)
MCHC: 33.6 g/dL (ref 30.0–36.0)
MCV: 93.8 fL (ref 80.0–100.0)
Platelets: 397 10*3/uL (ref 150–400)
RBC: 4.38 MIL/uL (ref 4.22–5.81)
RDW: 12.9 % (ref 11.5–15.5)
WBC: 11.8 10*3/uL — ABNORMAL HIGH (ref 4.0–10.5)
nRBC: 0 % (ref 0.0–0.2)

## 2021-10-04 LAB — RESP PANEL BY RT-PCR (FLU A&B, COVID) ARPGX2
Influenza A by PCR: NEGATIVE
Influenza B by PCR: NEGATIVE
SARS Coronavirus 2 by RT PCR: NEGATIVE

## 2021-10-04 LAB — TROPONIN I (HIGH SENSITIVITY): Troponin I (High Sensitivity): 12 ng/L (ref <18)

## 2021-10-04 IMAGING — CR DG CHEST 2V
1 series · 2 of 2 positions shown · non-contrast
Comparison: [DATE]

CLINICAL DATA: Generalized weakness

EXAM:
CHEST - 2 VIEW

[Series 1: dg chest 2 view · 0.14mm/px · 2 of 2 slices shown]
[im 1/2]
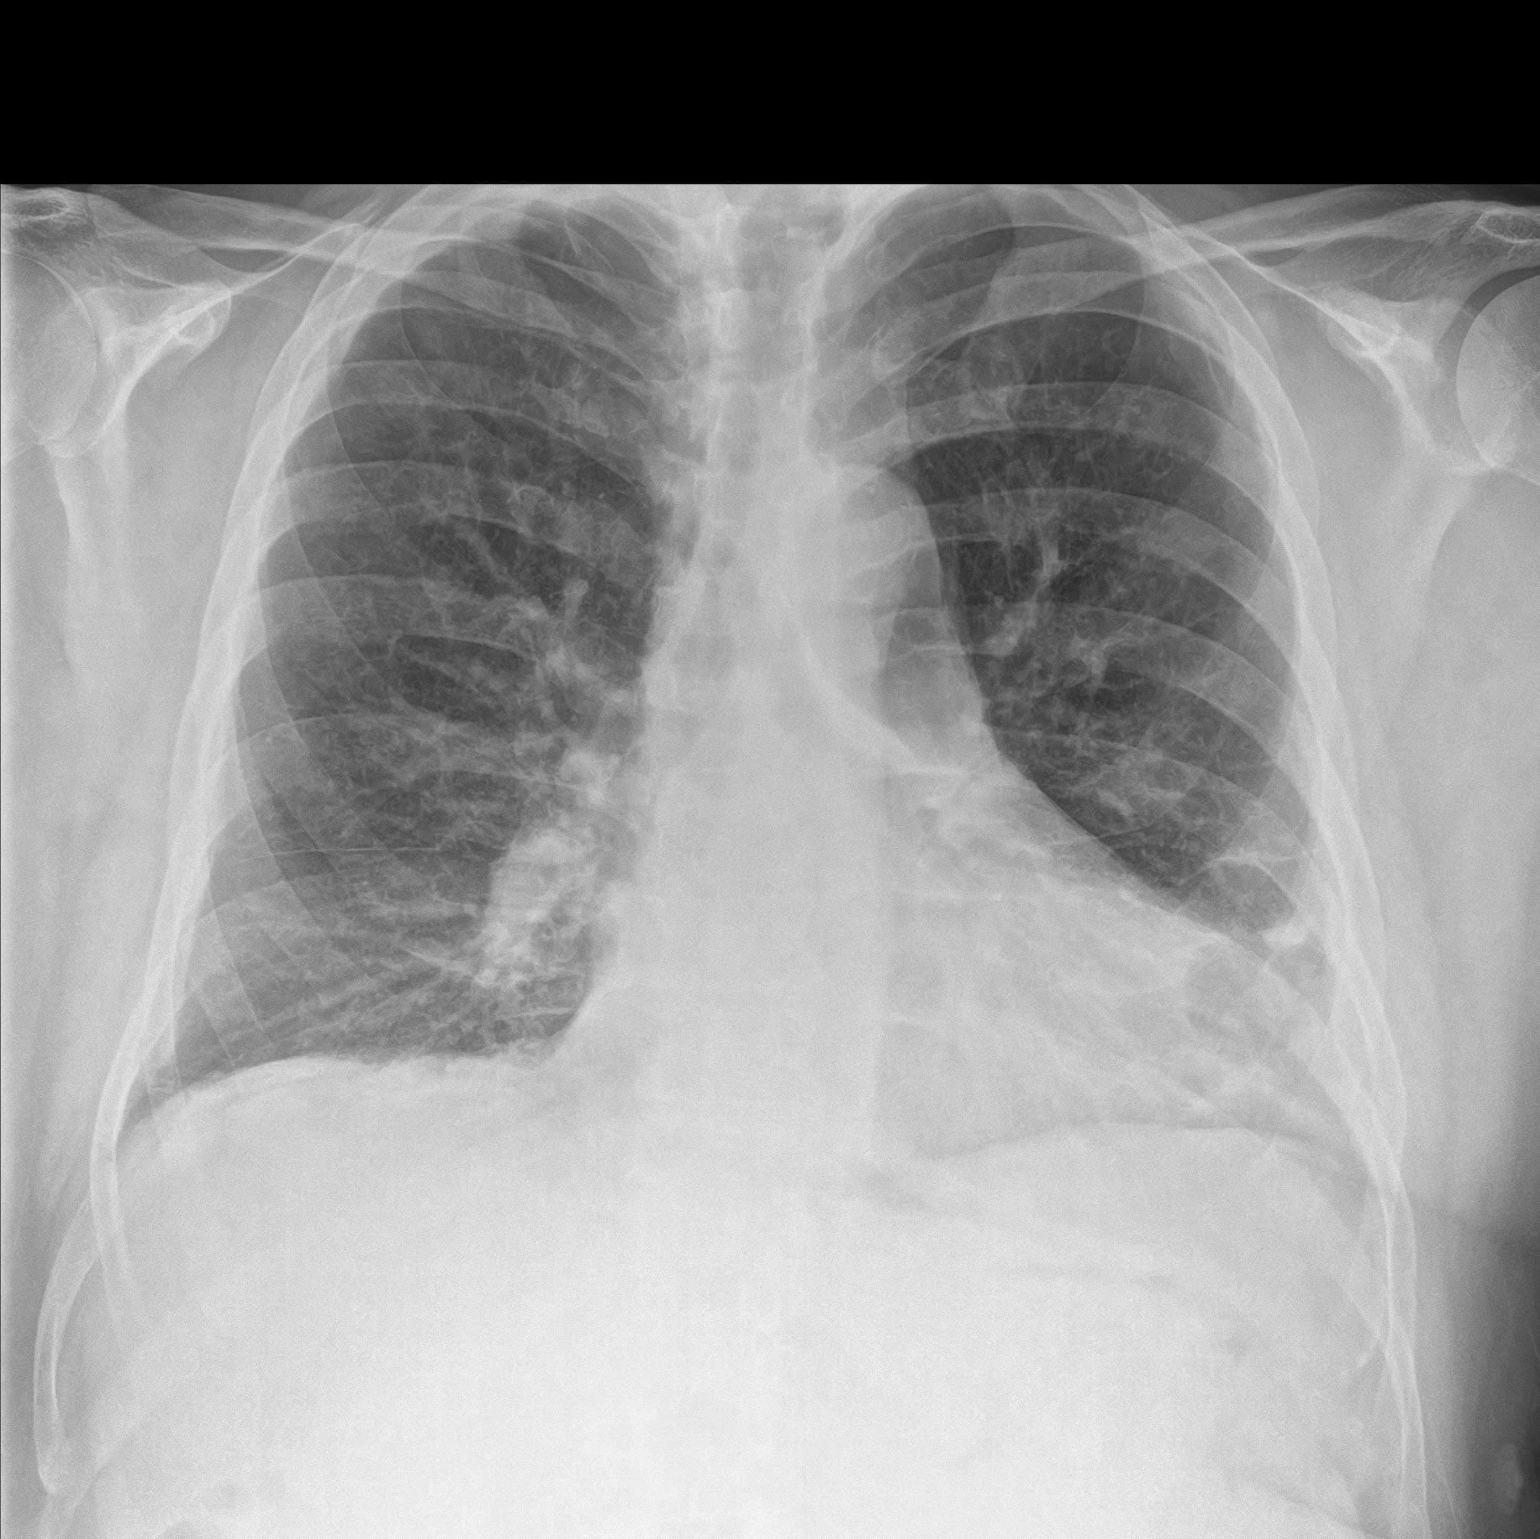
[im 2/2]
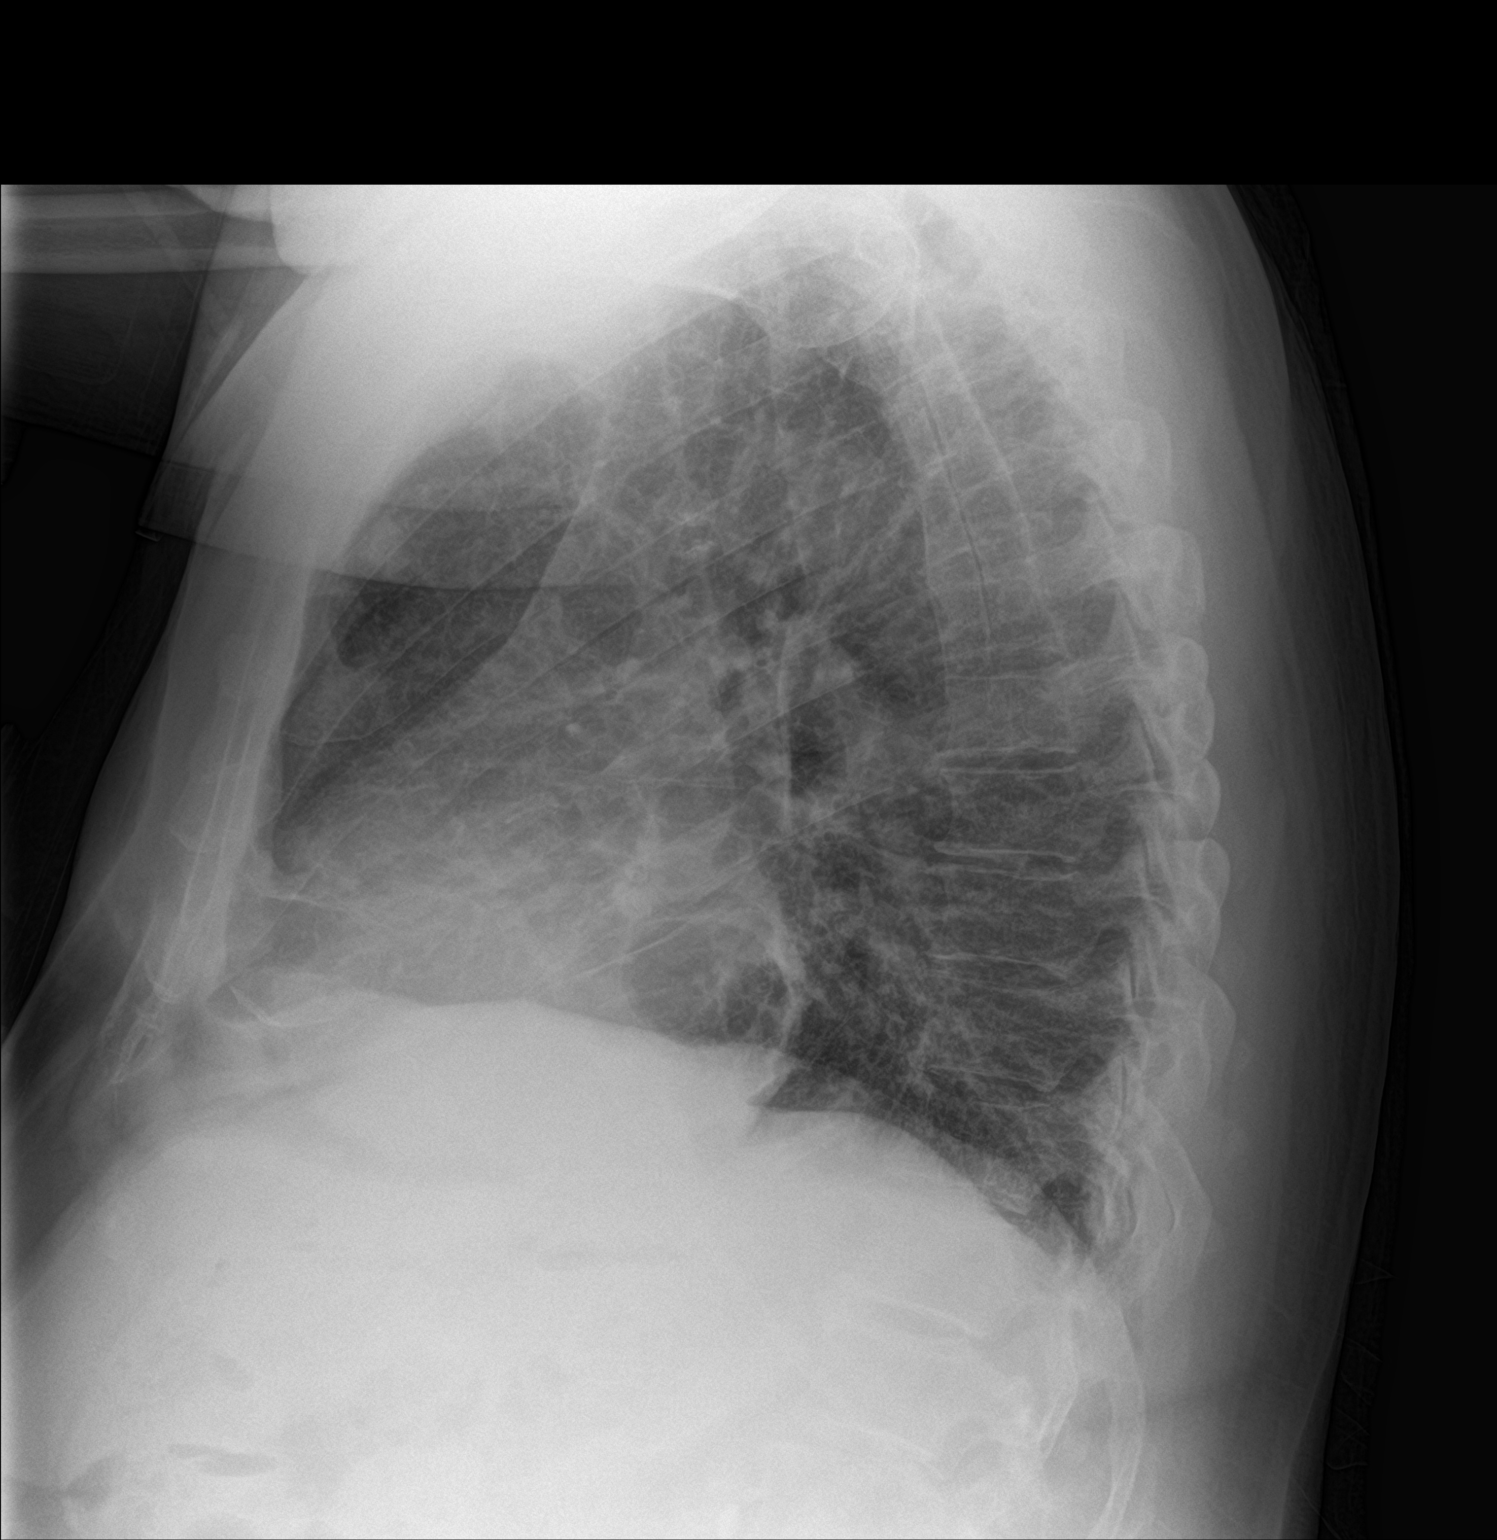

[2 of 2 positions shown; findings below may reference images not displayed]

FINDINGS: Heart size and pulmonary vascularity are normal. Left lingular
nodule and scarring are unchanged since prior study. No airspace
disease or consolidation in the lungs. No pleural effusions. No
pneumothorax. Mediastinal contours appear intact.
IMPRESSION: No evidence of active pulmonary disease.

## 2021-10-04 MED ORDER — SODIUM CHLORIDE 0.9 % IV BOLUS
500.0000 mL | Freq: Once | INTRAVENOUS | Status: AC
  Administered 2021-10-04: 500 mL via INTRAVENOUS

## 2021-10-04 NOTE — ED Triage Notes (Signed)
Pt to ED from home c/o right arm pain x1 month, lower back pain, and dizziness which he's had for about a year but this feels worse today.  Denies n/v/d, fevers, falls, sob or urinary changes.  Pt A&Ox4, chest rise even and unlabored, skin WNL and in NAD at this time. ?

## 2021-10-04 NOTE — ED Provider Notes (Signed)
? ?[facility] ?Provider Note ? ? ? Event Date/Time  ? First MD Initiated Contact with Patient 10/04/21 2122   ?  (approximate) ? ? ?History  ? ?Back Pain, Arm Pain, and Dizziness ? ? ?HPI ? ?Brian Porter is a 56 y.o. male with a history of hypertension, CAD, CHF, and CVA who presents with multiple complaints, but most significantly lightheadedness and body aches.  The patient states that he has had the symptoms for a few months, but they have worsened in the last week.  He feels them all the time.  The body aches are generalized but he does have some increased pain in his right arm.  He also feels slightly short of breath but denies cough, fever, or chest pain.  He has no vomiting or diarrhea.  He denies any urinary symptoms or any leg swelling.  He has not had any significant recent medication changes. ? ? ? ?Physical Exam  ? ?Triage Vital Signs: ?ED Triage Vitals  ?Enc Vitals Group  ?   BP 10/04/21 2113 113/78  ?   Pulse Rate 10/04/21 2113 77  ?   Resp 10/04/21 2113 18  ?   Temp 10/04/21 2113 98.4 ?F (36.9 ?C)  ?   Temp Source 10/04/21 2113 Oral  ?   SpO2 10/04/21 2113 96 %  ?   Weight 10/04/21 2114 239 lb (108.4 kg)  ?   Height 10/04/21 2114 5\' 9"  (1.753 m)  ?   Head Circumference --   ?   Peak Flow --   ?   Pain Score 10/04/21 2113 6  ?   Pain Loc --   ?   Pain Edu? --   ?   Excl. in GC? --   ? ? ?Most recent vital signs: ?Vitals:  ? 10/04/21 2113 10/04/21 2353  ?BP: 113/78 123/77  ?Pulse: 77 82  ?Resp: 18 18  ?Temp: 98.4 ?F (36.9 ?C)   ?SpO2: 96% 98%  ? ? ? ?General: Alert and oriented, relatively well-appearing. ?CV:  Good peripheral perfusion.  Normal heart sounds. ?Resp:  Normal effort.  Lungs with slightly coarse breath sounds. ?Abd:  No distention.  Soft and nontender. ?Other:  Slightly dry mucous membranes.  Motor intact in all extremities.  No peripheral edema. ? ? ?ED Results / Procedures / Treatments  ? ?Labs ?(all labs ordered are listed, but only abnormal results are  displayed) ?Labs Reviewed  ?BASIC METABOLIC PANEL - Abnormal; Notable for the following components:  ?    Result Value  ? Glucose, Bld 115 (*)   ? BUN 21 (*)   ? Calcium 8.7 (*)   ? All other components within normal limits  ?CBC - Abnormal; Notable for the following components:  ? WBC 11.8 (*)   ? All other components within normal limits  ?URINALYSIS, ROUTINE W REFLEX MICROSCOPIC - Abnormal; Notable for the following components:  ? Color, Urine STRAW (*)   ? APPearance CLEAR (*)   ? Leukocytes,Ua TRACE (*)   ? All other components within normal limits  ?RESP PANEL BY RT-PCR (FLU A&B, COVID) [id number]  ?TSH  ?BRAIN NATRIURETIC PEPTIDE  ?TROPONIN I (HIGH SENSITIVITY)  ? ? ? ?EKG ? ?ED ECG REPORT ?I06/06/23, the attending physician, personally viewed and interpreted this ECG. ? ?Date: 10/04/2021 ?EKG Time: 2116 ?Rate: 75 ?Rhythm: normal sinus rhythm ?QRS Axis: normal ?Intervals: LAFB ?ST/T Wave abnormalities: Nonspecific T wave abnormalities ?Narrative Interpretation: Nonspecific abnormalities with no evidence of acute  ischemia; no significant change when compared to EKG of 10/21/2020 ? ? ?RADIOLOGY ? ?Chest x-ray: I independently viewed and interpreted the images; there is no focal consolidation or edema ? ?PROCEDURES: ? ?Critical Care performed: No ? ?Procedures ? ? ?MEDICATIONS ORDERED IN ED: ?Medications  ?sodium chloride 0.9 % bolus 500 mL (0 mLs Intravenous Stopped 10/04/21 2252)  ? ? ? ?IMPRESSION / MDM / ASSESSMENT AND PLAN / ED COURSE  ?I reviewed the triage vital signs and the nursing notes. ? ?56 year old male with PMH as noted above presents with lightheadedness and body aches which have been present for the last few months but worse in the last week.  He also reports some right arm pain and mild shortness of breath. ? ?I reviewed the past medical records; the patient was most recently admitted to the hospital in April of last year.  Per the hospitalist discharge summary from 10/23/2020 the patient  presented with lightheadedness and a sensation of room spinning.  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 ? ?The physical exam is unremarkable.  The vital signs are normal.  The patient is well-appearing.  Neurologic exam is nonfocal. ? ?Differential diagnosis includes, but is not limited to, dehydration, hypovolemia, electrolyte abnormality, other metabolic cause, hypotension, medication side effects, CHF, ACS, viral syndrome, or less likely CNS etiology. ? ?Initial basic labs and urinalysis are negative.  Troponin is negative.  Based on the duration of the symptoms there is no indication for a repeat.  I will add on a BNP, TSH, respiratory panel, give fluids, and reassess. ? ?----------------------------------------- ?12:17 AM on 10/05/2021 ?----------------------------------------- ? ?Additional workup so far is negative.  Based on the patient's prior history of possible CVA with similar symptoms, although his neuro exam is currently nonfocal, I discussed obtaining an MRI to rule out subacute infarct.  The patient agrees.  I have ordered this; if it is negative I anticipate discharge home.  I signed the patient out to the oncoming ED physician Dr. Don Perking.  ? ? ?FINAL CLINICAL IMPRESSION(S) / ED DIAGNOSES  ? ?Final diagnoses:  ?Lightheadedness  ? ? ? ?Rx / DC Orders  ? ?ED Discharge Orders   ? ? None  ? ?  ? ? ? ?Note:  This document was prepared using Dragon voice recognition software and may include unintentional dictation errors.  ?  Dionne Bucy, MD ?10/05/21 0018 ? ?

## 2021-10-05 ENCOUNTER — Emergency Department: Payer: Self-pay

## 2021-10-05 LAB — BRAIN NATRIURETIC PEPTIDE: B Natriuretic Peptide: 18.6 pg/mL (ref 0.0–100.0)

## 2021-10-05 LAB — TSH: TSH: 1.486 u[IU]/mL (ref 0.350–4.500)

## 2021-10-05 IMAGING — MR MR HEAD W/O CM
13 series · 46 of 48 positions shown · non-contrast
Comparison: None.

CLINICAL DATA: Nonspecific dizziness

EXAM:
MRI HEAD WITHOUT CONTRAST
TECHNIQUE: Multiplanar, multiecho pulse sequences of the brain and surrounding
structures were obtained without intravenous contrast.

[Series 5: ax dwi_tracew · axial · 3.0mm · 0.65mm/px · z∈[-44,+122]mm · 3 of 52 slices shown]
[im 1/52]
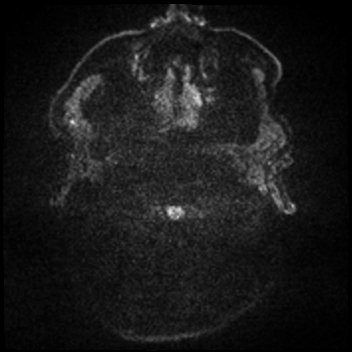
[im 26/52]
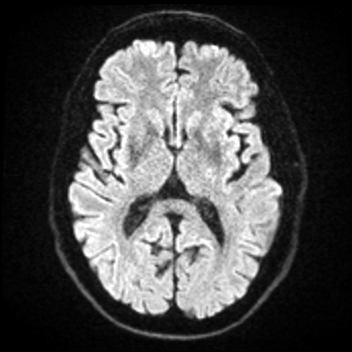
[im 52/52]
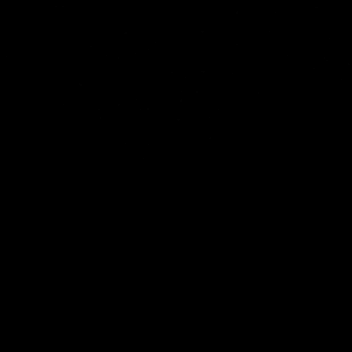

[Series 6: ax dwi_adc · axial · 3.0mm · 0.65mm/px · z∈[-44,+116]mm · 4 of 50 slices shown]
[im 1/50]
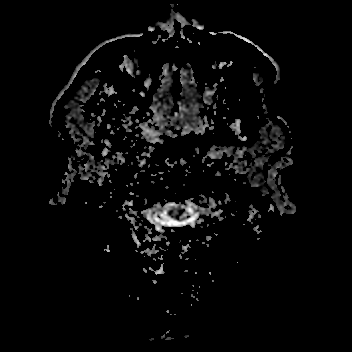
[im 17/50]
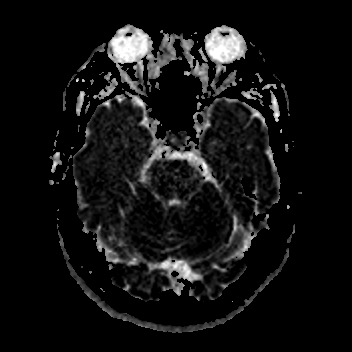
[im 33/50]
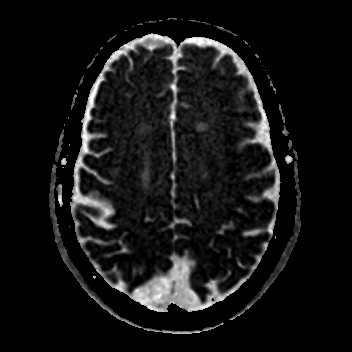
[im 50/50]
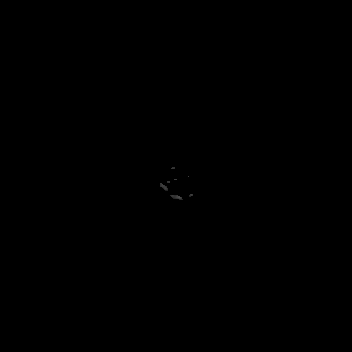

[Series 7: cor dwi_tracew · coronal · 5.0mm · 0.65mm/px · 3 of 40 slices shown]
[im 1/40]
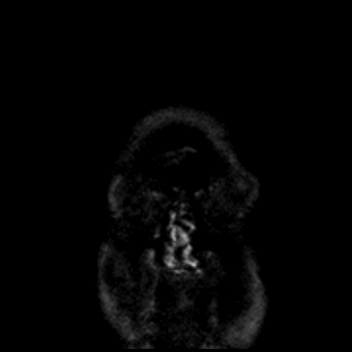
[im 20/40]
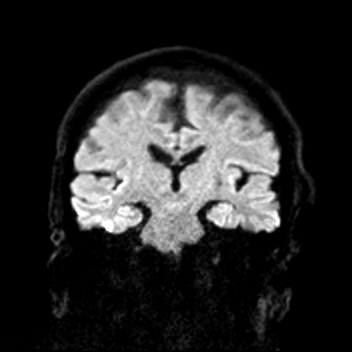
[im 40/40]
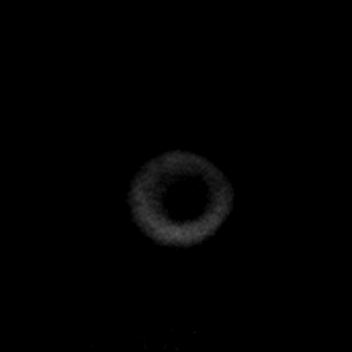

[Series 8: cor dwi_adc · coronal · 5.0mm · 0.65mm/px · 3 of 40 slices shown]
[im 1/40]
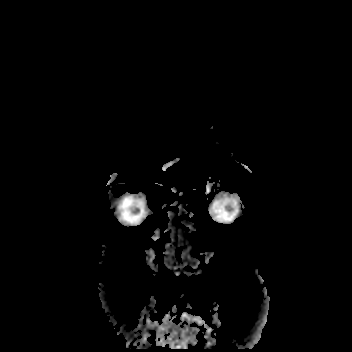
[im 20/40]
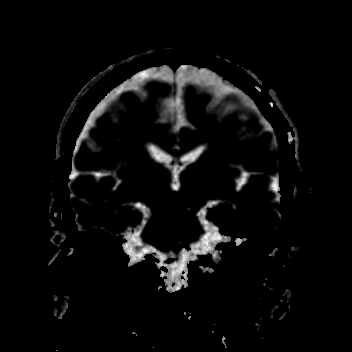
[im 40/40]
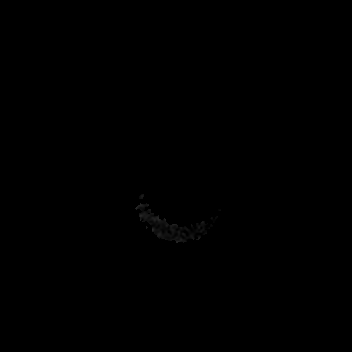

[Series 9: T1 · sagittal · 5.0mm · 0.62mm/px · 2 of 25 slices shown (1 of 4)]
[im 1/25]
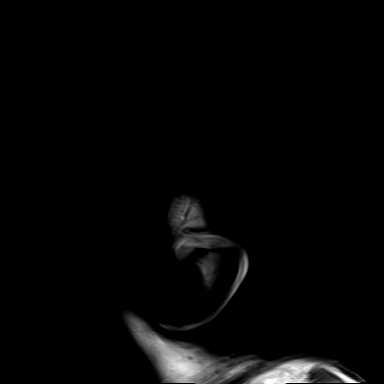
[im 25/25]
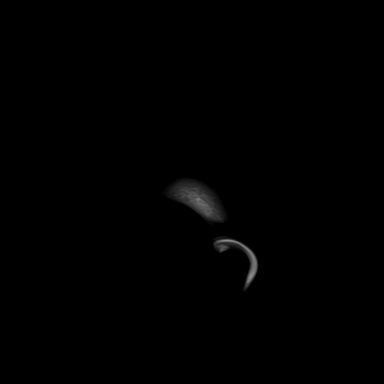

[Series 10: T2 · axial · 5.0mm · 0.53mm/px · z∈[-40,+120]mm · 2 of 28 slices shown (1 of 2)]
[im 1/28]
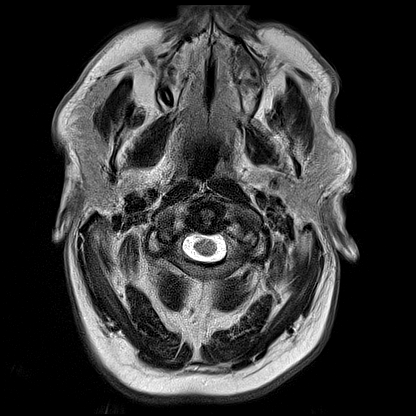
[im 28/28]
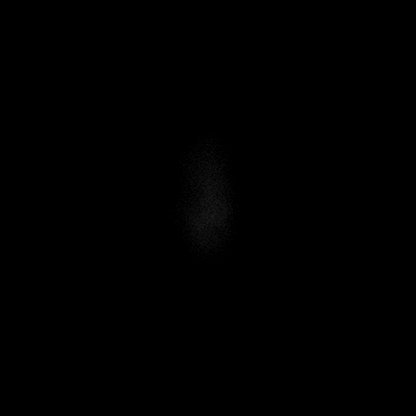

[Series 12: pha_images · axial · 3.0mm · 0.90mm/px · z∈[-44,+122]mm · 4 of 53 slices shown]
[im 1/53]
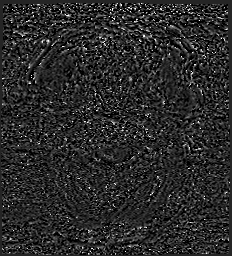
[im 18/53]
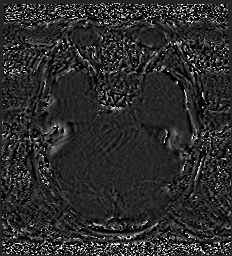
[im 35/53]
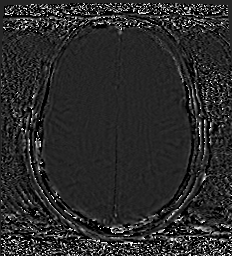
[im 53/53]
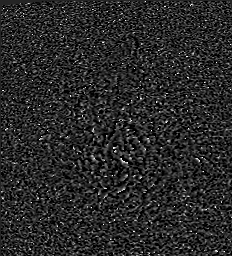

[Series 13: swi_images · axial · 3.0mm · 0.90mm/px · z∈[-47,+128]mm · 4 of 60 slices shown]
[im 1/60]
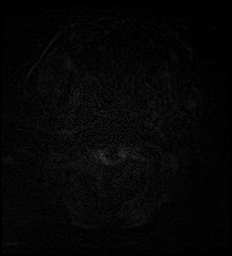
[im 20/60]
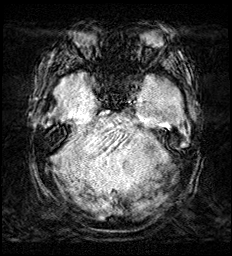
[im 40/60]
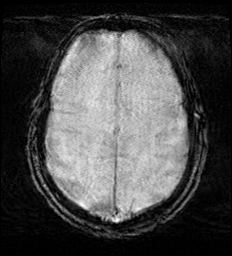
[im 60/60]
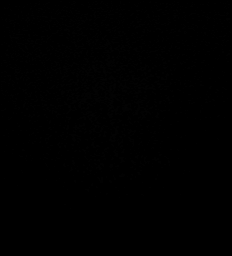

[Series 15: FLAIR · axial · 3.0mm · 0.53mm/px · z∈[-40,+120]mm · 4 of 55 slices shown]
[im 1/55]
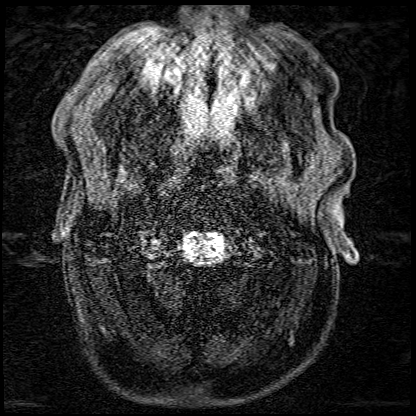
[im 19/55]
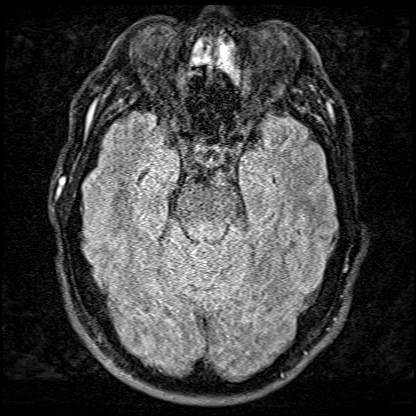
[im 37/55]
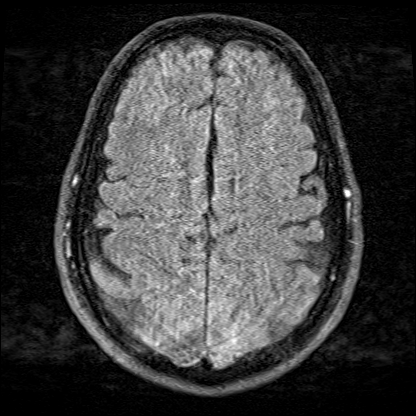
[im 55/55]
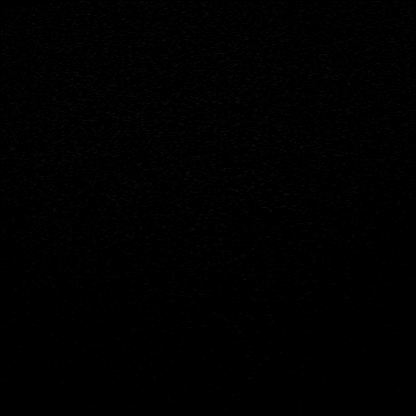

[Series 16: T1 · axial · 1.0mm · 0.98mm/px · z∈[-48,+125]mm · 11 of 173 slices shown (2 of 4)]
[im 1/173]
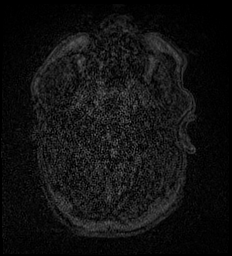
[im 15/173]
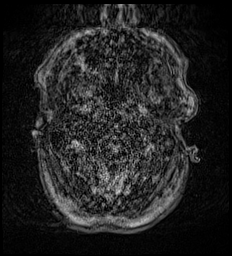
[im 29/173]
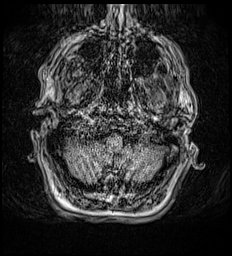
[im 44/173]
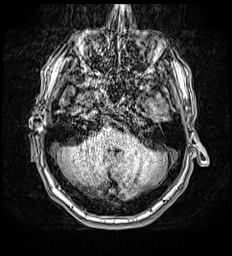
[im 58/173]
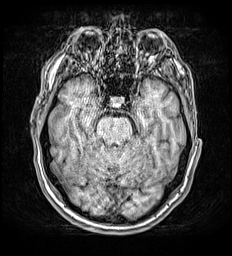
[im 72/173]
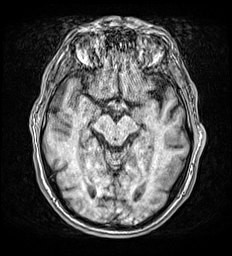
[im 87/173]
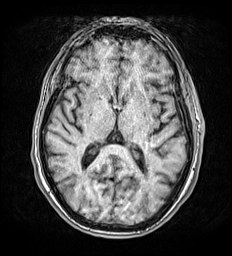
[im 101/173]
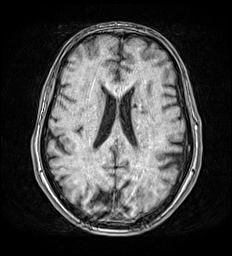
[im 115/173]
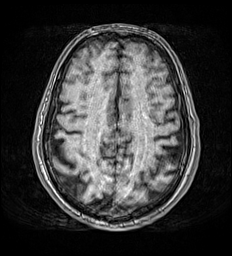
[im 144/173]
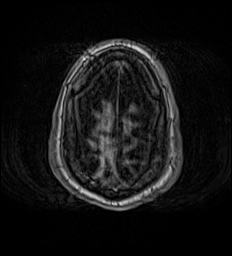
[im 173/173]
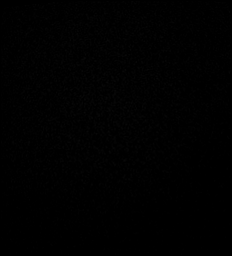

[Series 17: T1 · sagittal · 5.0mm · 0.94mm/px · 2 of 23 slices shown (3 of 4)]
[im 1/23]
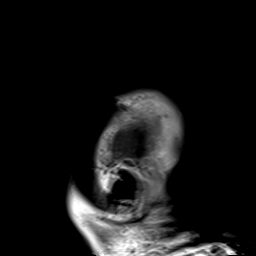
[im 23/23]
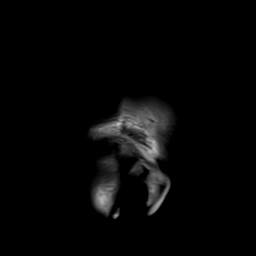

[Series 18: T2 · coronal · 5.0mm · 0.45mm/px · 2 of 33 slices shown (2 of 2)]
[im 1/33]
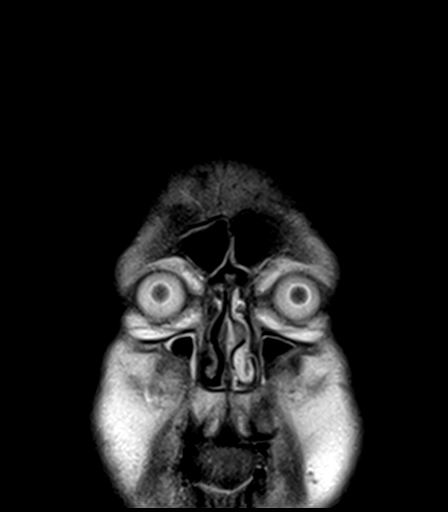
[im 33/33]
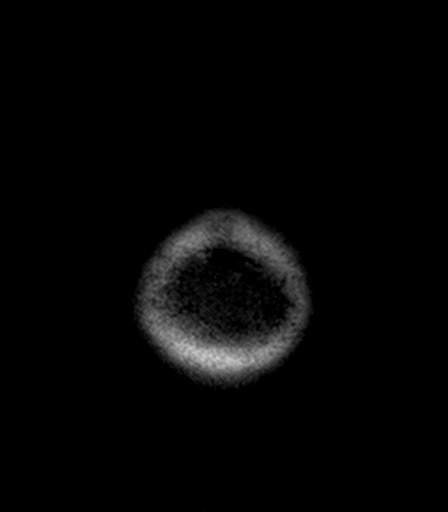

[Series 20: T1 · sagittal · 5.0mm · 0.94mm/px · 2 of 23 slices shown (4 of 4)]
[im 1/23]
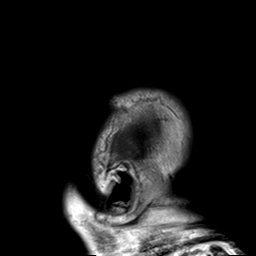
[im 23/23]
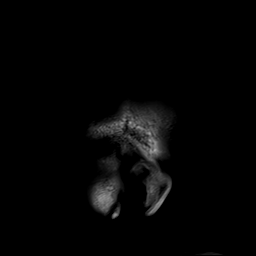

[46 of 48 positions shown; findings below may reference images not displayed]

FINDINGS: Brain: No acute infarct, mass effect or extra-axial collection. No
acute or chronic hemorrhage. There is multifocal hyperintense
T2-weighted signal within the white matter. Generalized volume loss
without a clear lobar predilection. Old bilateral cerebellar small
vessel infarcts and bilateral basal ganglia small vessel infarcts.
The midline structures are normal.

Vascular: Major flow voids are preserved.

Skull and upper cervical spine: Normal calvarium and skull base.
Visualized upper cervical spine and soft tissues are normal.

Sinuses/Orbits:No paranasal sinus fluid levels or advanced mucosal
thickening. No mastoid or middle ear effusion. Normal orbits.
IMPRESSION: 1. No acute intracranial abnormality.
2. Old bilateral cerebellar and basal ganglia small vessel infarcts.
3. Volume loss and chronic ischemic microangiopathy.

## 2021-10-05 MED ORDER — LORAZEPAM 2 MG/ML IJ SOLN
1.0000 mg | Freq: Once | INTRAMUSCULAR | Status: AC
Start: 2021-10-05 — End: 2021-10-05
  Administered 2021-10-05: 1 mg via INTRAVENOUS
  Filled 2021-10-05: qty 1

## 2021-10-05 NOTE — ED Provider Notes (Signed)
I was asked by Dr. Marisa Severin to follow-up Brian Porter's MRI to rule out stroke as a possible cause of Brian Porter's symptoms.  MRI is negative for acute stroke only showing his old known strokes.  Brian Porter remains well-appearing.  I did review all his blood work which showed no acute abnormalities.  Brian Porter is stable for discharge home per plan left by Dr. Marisa Severin. Brian Porter is comfortable with the plan.  Recommended follow-up with PCP and discussed my standard return precautions ? ? ? ?I, Nita Sickle, attending MD, have personally viewed and interpreted the images obtained during this visit as below: ? ?MRI negative for acute stroke ? ? ?___________________________________________________ ?Interpretation by Radiologist:  ?DG Chest 2 View ? ?Result Date: 10/04/2021 ?CLINICAL DATA:  Generalized weakness EXAM: CHEST - 2 VIEW COMPARISON:  05/31/2021 FINDINGS: Heart size and pulmonary vascularity are normal. Left lingular nodule and scarring are unchanged since prior study. No airspace disease or consolidation in the lungs. No pleural effusions. No pneumothorax. Mediastinal contours appear intact. IMPRESSION: No evidence of active pulmonary disease. Electronically Signed   By: Burman Nieves M.D.   On: 10/04/2021 22:57  ? ?MR BRAIN WO CONTRAST ? ?Result Date: 10/05/2021 ?CLINICAL DATA:  Nonspecific dizziness EXAM: MRI HEAD WITHOUT CONTRAST TECHNIQUE: Multiplanar, multiecho pulse sequences of the brain and surrounding structures were obtained without intravenous contrast. COMPARISON:  None. FINDINGS: Brain: No acute infarct, mass effect or extra-axial collection. No acute or chronic hemorrhage. There is multifocal hyperintense T2-weighted signal within the white matter. Generalized volume loss without a clear lobar predilection. Old bilateral cerebellar small vessel infarcts and bilateral basal ganglia small vessel infarcts. The midline structures are normal. Vascular: Major flow voids are preserved. Skull and upper cervical  spine: Normal calvarium and skull base. Visualized upper cervical spine and soft tissues are normal. Sinuses/Orbits:No paranasal sinus fluid levels or advanced mucosal thickening. No mastoid or middle ear effusion. Normal orbits. IMPRESSION: 1. No acute intracranial abnormality. 2. 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 3. Volume loss and chronic ischemic microangiopathy. Electronically Signed   By: Deatra Robinson M.D.   On: 10/05/2021 01:34   ? ?  ?Nita Sickle, MD ?10/05/21 [phone] ? ?

## 2021-10-05 NOTE — ED Notes (Signed)
Pt provided a phone to speak with MRI. 

## 2021-10-05 NOTE — ED Notes (Signed)
Pt to MRI

## 2021-10-11 ENCOUNTER — Ambulatory Visit: Payer: Self-pay | Admitting: Gerontology

## 2021-10-18 ENCOUNTER — Encounter: Payer: Self-pay | Admitting: Gerontology

## 2021-10-18 ENCOUNTER — Ambulatory Visit: Payer: Self-pay | Admitting: Gerontology

## 2021-10-18 VITALS — BP 119/77 | HR 70 | Temp 97.6°F | Resp 16 | Ht 69.0 in | Wt 241.3 lb

## 2021-10-18 DIAGNOSIS — R42 Dizziness and giddiness: Secondary | ICD-10-CM

## 2021-10-18 NOTE — Patient Instructions (Signed)
Heart-Healthy Eating Plan Many factors influence your heart (coronary) health, including eating and exercise habits. Coronary risk increases with abnormal blood fat (lipid) levels. Heart-healthy meal planning includes limiting unhealthy fats, increasing healthy fats, and making other diet and lifestyle changes. What is my plan? Your health care provider may recommend that you: Limit your fat intake to _________% or less of your total calories each day. Limit your saturated fat intake to _________% or less of your total calories each day. Limit the amount of cholesterol in your diet to less than _________ mg per day. What are tips for following this plan? Cooking Cook foods using methods other than frying. Baking, boiling, grilling, and broiling are all good options. Other ways to reduce fat include: Removing the skin from poultry. Removing all visible fats from meats. Steaming vegetables in water or broth. Meal planning  At meals, imagine dividing your plate into fourths: Fill one-half of your plate with vegetables and green salads. Fill one-fourth of your plate with whole grains. Fill one-fourth of your plate with lean protein foods. Eat 4-5 servings of vegetables per day. One serving equals 1 cup raw or cooked vegetable, or 2 cups raw leafy greens. Eat 4-5 servings of fruit per day. One serving equals 1 medium whole fruit,  cup dried fruit,  cup fresh, frozen, or canned fruit, or  cup 100% fruit juice. Eat more foods that contain soluble fiber. Examples include apples, broccoli, carrots, beans, peas, and barley. Aim to get 25-30 g of fiber per day. Increase your consumption of legumes, nuts, and seeds to 4-5 servings per week. One serving of dried beans or legumes equals  cup cooked, 1 serving of nuts is  cup, and 1 serving of seeds equals 1 tablespoon. Fats Choose healthy fats more often. Choose monounsaturated and polyunsaturated fats, such as olive and canola oils, flaxseeds,  walnuts, almonds, and seeds. Eat more omega-3 fats. Choose salmon, mackerel, sardines, tuna, flaxseed oil, and ground flaxseeds. Aim to eat fish at least 2 times each week. Check food labels carefully to identify foods with trans fats or high amounts of saturated fat. Limit saturated fats. These are found in animal products, such as meats, butter, and cream. Plant sources of saturated fats include palm oil, palm kernel oil, and coconut oil. Avoid foods with partially hydrogenated oils in them. These contain trans fats. Examples are stick margarine, some tub margarines, cookies, crackers, and other baked goods. Avoid fried foods. General information Eat more home-cooked food and less restaurant, buffet, and fast food. Limit or avoid alcohol. Limit foods that are high in starch and sugar. Lose weight if you are overweight. Losing just 5-10% of your body weight can help your overall health and prevent diseases such as diabetes and heart disease. Monitor your salt (sodium) intake, especially if you have high blood pressure. Talk with your health care provider about your sodium intake. Try to incorporate more vegetarian meals weekly. What foods can I eat? Fruits All fresh, canned (in natural juice), or frozen fruits. Vegetables Fresh or frozen vegetables (raw, steamed, roasted, or grilled). Green salads. Grains Most grains. Choose whole wheat and whole grains most of the time. Rice and pasta, including brown rice and pastas made with whole wheat. Meats and other proteins Lean, well-trimmed beef, veal, pork, and lamb. Chicken and turkey without skin. All fish and shellfish. Wild duck, rabbit, pheasant, and venison. Egg whites or low-cholesterol egg substitutes. Dried beans, peas, lentils, and tofu. Seeds and most nuts. Dairy Low-fat or nonfat cheeses,   including ricotta and mozzarella. Skim or 1% milk (liquid, powdered, or evaporated). Buttermilk made with low-fat milk. Nonfat or low-fat  yogurt. ?Fats and oils ?Non-hydrogenated (trans-free) margarines. Vegetable oils, including soybean, sesame, sunflower, olive, peanut, safflower, corn, canola, and cottonseed. Salad dressings or mayonnaise made with a vegetable oil. ?Beverages ?Water (mineral or sparkling). Coffee and tea. Diet carbonated beverages. ?Sweets and desserts ?Sherbet, gelatin, and fruit ice. Small amounts of dark chocolate. ?Limit all sweets and desserts. ?Seasonings and condiments ?All seasonings and condiments. ?The items listed above may not be a complete list of foods and beverages you can eat. Contact a dietitian for more options. ?What foods are not recommended? ?Fruits ?Canned fruit in heavy syrup. Fruit in cream or butter sauce. Fried fruit. Limit coconut. ?Vegetables ?Vegetables cooked in cheese, cream, or butter sauce. Fried vegetables. ?Grains ?Breads made with saturated or trans fats, oils, or whole milk. Croissants. Sweet rolls. Donuts. High-fat crackers, such as cheese crackers. ?Meats and other proteins ?Fatty meats, such as hot dogs, ribs, sausage, bacon, rib-eye roast or steak. High-fat deli meats, such as salami and bologna. Caviar. Domestic duck and goose. Organ meats, such as liver. ?Dairy ?Cream, sour cream, cream cheese, and creamed cottage cheese. Whole-milk cheeses. Whole or 2% milk (liquid, evaporated, or condensed). Whole buttermilk. Cream sauce or high-fat cheese sauce. Whole-milk yogurt. ?Fats and oils ?Meat fat, or shortening. Cocoa butter, hydrogenated oils, palm oil, coconut oil, palm kernel oil. Solid fats and shortenings, including bacon fat, salt pork, lard, and butter. Nondairy cream substitutes. Salad dressings with cheese or sour cream. ?Beverages ?Regular sodas and any drinks with added sugar. ?Sweets and desserts ?Frosting. Pudding. Cookies. Cakes. Pies. Milk chocolate or white chocolate. Buttered syrups. Full-fat ice cream or ice cream drinks. ?The items listed above may not be a complete list of  foods and beverages to avoid. Contact a dietitian for more information. ?Summary ?Heart-healthy meal planning includes limiting unhealthy fats, increasing healthy fats, and making other diet and lifestyle changes. ?Lose weight if you are overweight. Losing just 5-10% of your body weight can help your overall health and prevent diseases such as diabetes and heart disease. ?Focus on eating a balance of foods, including fruits and vegetables, low-fat or nonfat dairy, lean protein, nuts and legumes, whole grains, and heart-healthy oils and fats. ?This information is not intended to replace advice given to you by your health care provider. Make sure you discuss any questions you have with your health care provider. ?Document Revised: 10/26/2020 Document Reviewed: 10/26/2020 ?Elsevier Patient Education ? 2022 [employer] ?DASH Eating Plan ?DASH stands for Dietary Approaches to Stop Hypertension. The DASH eating plan is a healthy eating plan that has been shown to: ?Reduce high blood pressure (hypertension). ?Reduce your risk for type 2 diabetes, heart disease, and stroke. ?Help with weight loss. ?What are tips for following this plan? ?Reading food labels ?Check food labels for the amount of salt (sodium) per serving. Choose foods with less than 5 percent of the Daily Value of sodium. Generally, foods with less than 300 milligrams (mg) of sodium per serving fit into this eating plan. ?To find whole grains, look for the word "whole" as the first word in the ingredient list. ?Shopping ?Buy products labeled as "low-sodium" or "no salt added." ?Buy fresh foods. Avoid canned foods and pre-made or frozen meals. ?Cooking ?Avoid adding salt when cooking. Use salt-free seasonings or herbs instead of table salt or sea salt. Check with your health care provider or pharmacist before using salt substitutes. ?  Do not fry foods. Cook foods using healthy methods such as baking, boiling, grilling, roasting, and broiling instead. ?Cook  with heart-healthy oils, such as olive, canola, avocado, soybean, or sunflower oil. ?Meal planning ? ?Eat a balanced diet that includes: ?4 or more servings of fruits and 4 or more servings of vegetables each day. Try to

## 2021-10-18 NOTE — Progress Notes (Signed)
? ?New Patient Office Visit ? ?Subjective   ? ?Patient ID: Brian Porter, male    DOB: Aug 14, 1965  Age: 56 y.o. MRN: [id number] ? ?CC:  ?Chief Complaint  ?Patient presents with  ? Follow-up  ? ? ?HPI ?Brian Porter is a 56 y/o male who has history of CAD, CHF, GERD, Hypertension, MI, presents with a concern about the fluctuation of his blood pressure and light headedness that has been going on for a while and medication refill. He was seen at the  ED on 10/04/21  by Dr. Marisa Severin for lightheadedness, EKG done was normal, chest X-ray done and showed no focal consolidation or edema.. He states that he is compliant with his medications and has been taking  Losartan 25 mg daily  and exercises as much as possible.  He states that he checks his blood pressure twice a day at home and his readings are between  118/70 to 124/74. He stated that the lightheadedness has  improved since he visited the ED and continues on 25 mg losartan . He denies any fall or vision changes, pain , fatigue, chills and N/V. He states that he is overall doing well and denies any other complaints. ? ?Outpatient Encounter Medications as of 10/18/2021  ?Medication Sig  ? acetaminophen (TYLENOL) 650 MG CR tablet Take 650 mg by mouth every 8 (eight) hours as needed for pain.  ? aspirin 81 MG EC tablet Take 1 tablet (81 mg total) by mouth daily.  ? atorvastatin (LIPITOR) 20 MG tablet Take 1 tablet (20 mg total) by mouth daily.  ? furosemide (LASIX) 40 MG tablet Take 1 tablet (40 mg total) by mouth daily.  ? gabapentin (NEURONTIN) 100 MG capsule Take 1 capsule (100 mg total) by mouth 3 (three) times daily.  ? Lidocaine (HM LIDOCAINE PATCH) 4 % PTCH Apply 1 patch topically every 12 (twelve) hours. (Patient taking differently: Apply 1 patch topically every 12 (twelve) hours as needed.)  ? losartan (COZAAR) 25 MG tablet Take 1 tablet (25 mg total) by mouth daily.  ? metoprolol succinate (TOPROL XL) 25 MG 24 hr tablet Take 1 tablet (25 mg total) by mouth daily.   ? potassium chloride (KLOR-CON) 10 MEQ tablet Take 0.5 tablets (5 mEq total) by mouth daily.  ? spironolactone (ALDACTONE) 25 MG tablet Take 1 tablet (25 mg total) by mouth daily.  ? ?No facility-administered encounter medications on file as of 10/18/2021.  ? ? ?Past Medical History:  ?Diagnosis Date  ? CAD (coronary artery disease)   ? CHF (congestive heart failure) (HCC)   ? GERD (gastroesophageal reflux disease)   ? Hypertension   ? MI, old   ? Sleep apnea   ? ? ?Past Surgical History:  ?Procedure Laterality Date  ? CARDIAC CATHETERIZATION Right 10/16/2015  ? Procedure: Left Heart Cath and Coronary Angiography;  Surgeon: Laurier Nancy, MD;  Location: [facility] INVASIVE CV [facility];  Service: Cardiovascular;  Laterality: Right;  ? CORONARY ANGIOPLASTY WITH STENT PLACEMENT  approx 2 years ago  ? HERNIA REPAIR Left 11/29/2016  ? [facility]  ? PARTIAL NEPHRECTOMY Left   ? SPLENECTOMY    ? ? ?Family History  ?Problem Relation Age of Onset  ? Hypertension Mother   ? Other Mother   ?     covid pneumonia  ? Congestive Heart Failure Mother   ? Heart attack Father 43  ? Diabetes Father   ? Hypertension Father   ? Diabetes Mellitus II Brother   ? ? ?Social  History  ? ?Socioeconomic History  ? Marital status: Single  ?  Spouse name: Not on file  ? Number of children: Not on file  ? Years of education: Not on file  ? Highest education level: Not on file  ?Occupational History  ? Occupation: unemployed  ?Tobacco Use  ? Smoking status: Former  ?  Packs/day: 0.50  ?  Types: Cigarettes  ?  Quit date: 10/24/2020  ?  Years since quitting: 0.9  ? Smokeless tobacco: Never  ?Vaping Use  ? Vaping Use: Never used  ?Substance and Sexual Activity  ? Alcohol use: No  ?  Alcohol/week: 0.0 standard drinks  ? Drug use: Not Currently  ?  Comment: percocet  ? Sexual activity: Not Currently  ?Other Topics Concern  ? Not on file  ?Social History Narrative  ? Not on file  ? ?Social Determinants of Health  ? ?Financial Resource Strain: Not on file  ?Food Insecurity:  No Food Insecurity  ? Worried About Programme researcher, broadcasting/film/video in the Last Year: Never true  ? Ran Out of Food in the Last Year: Never true  ?Transportation Needs: No Transportation Needs  ? Lack of Transportation (Medical): No  ? Lack of Transportation (Non-Medical): No  ?Physical Activity: Not on file  ?Stress: Not on file  ?Social Connections: Not on file  ?Intimate Partner Violence: Not on file  ? ? ?Review of Systems  ?Constitutional: Negative.   ?HENT: Negative.    ?Eyes: Negative.   ?Respiratory: Negative.    ?Cardiovascular: Negative.   ?Gastrointestinal: Negative.   ?Genitourinary: Negative.   ?Musculoskeletal: Negative.   ?Skin: Negative.   ?Neurological: Negative.   ?Psychiatric/Behavioral: Negative.    ? ?  ? ? ?Objective   ? ?BP 119/77 (BP Location: Right Arm, Patient Position: Sitting, Cuff Size: Normal)   Pulse 70   Temp 97.6 ?F (36.4 ?C)   Resp 16   Ht 5\' 9"  (1.753 m)   Wt 241 lb 4.8 oz (109.5 kg)   SpO2 95%   BMI 35.63 kg/m?  ? ?Physical Exam ?Constitutional:   ?   Appearance: Normal appearance.  ?Cardiovascular:  ?   Rate and Rhythm: Normal rate and regular rhythm.  ?Pulmonary:  ?   Effort: Pulmonary effort is normal.  ?   Breath sounds: Normal breath sounds.  ?Abdominal:  ?   General: Bowel sounds are normal.  ?Musculoskeletal:     ?   General: Normal range of motion.  ?   Cervical back: Normal range of motion.  ?Skin: ?   General: Skin is warm.  ?Neurological:  ?   Mental Status: He is alert and oriented to person, place, and time.  ?Psychiatric:     ?   Mood and Affect: Mood normal.  ? ? ? ?  ? ?Assessment & Plan:  ? ? ?1. Light-headedness ?- His symptoms has improved, will continue on 25 mg Losartan, DASH diet. He was advised to notify clinic for worsening symptoms. ? ? ?Return in about 8 weeks (around 12/12/2021), or if symptoms worsen or fail to improve.  ? ?Chioma 12/14/2021, NP ? ? ?

## 2021-11-02 ENCOUNTER — Other Ambulatory Visit: Payer: Self-pay | Admitting: Family

## 2021-11-08 ENCOUNTER — Other Ambulatory Visit: Payer: Self-pay

## 2021-11-15 ENCOUNTER — Ambulatory Visit: Payer: Self-pay

## 2021-11-30 ENCOUNTER — Telehealth (HOSPITAL_COMMUNITY): Payer: Self-pay

## 2021-11-30 NOTE — Telephone Encounter (Signed)
Called to sch 1 yr fu for patient to see PA with ultrasound, patient declined at this time stating "I don't really need to see him right now, I'm still doing pretty good"

## 2021-12-03 ENCOUNTER — Other Ambulatory Visit: Payer: Self-pay | Admitting: Family

## 2021-12-05 ENCOUNTER — Other Ambulatory Visit: Payer: Self-pay

## 2021-12-05 DIAGNOSIS — Z09 Encounter for follow-up examination after completed treatment for conditions other than malignant neoplasm: Secondary | ICD-10-CM

## 2021-12-06 LAB — COMPREHENSIVE METABOLIC PANEL
ALT: 18 IU/L (ref 0–44)
AST: 17 IU/L (ref 0–40)
Albumin/Globulin Ratio: 2 (ref 1.2–2.2)
Albumin: 4.7 g/dL (ref 3.8–4.9)
Alkaline Phosphatase: 72 IU/L (ref 44–121)
BUN/Creatinine Ratio: 14 (ref 9–20)
BUN: 13 mg/dL (ref 6–24)
Bilirubin Total: 0.3 mg/dL (ref 0.0–1.2)
CO2: 25 mmol/L (ref 20–29)
Calcium: 10.1 mg/dL (ref 8.7–10.2)
Chloride: 104 mmol/L (ref 96–106)
Creatinine, Ser: 0.94 mg/dL (ref 0.76–1.27)
Globulin, Total: 2.4 g/dL (ref 1.5–4.5)
Glucose: 100 mg/dL — ABNORMAL HIGH (ref 70–99)
Potassium: 4.5 mmol/L (ref 3.5–5.2)
Sodium: 141 mmol/L (ref 134–144)
Total Protein: 7.1 g/dL (ref 6.0–8.5)
eGFR: 95 mL/min/{1.73_m2} (ref >59)

## 2021-12-06 LAB — CBC WITH DIFFERENTIAL/PLATELET
Basophils Absolute: 0.1 10*3/uL (ref 0.0–0.2)
Basos: 1 %
EOS (ABSOLUTE): 0.7 10*3/uL — ABNORMAL HIGH (ref 0.0–0.4)
Eos: 6 %
Hematocrit: 44.5 % (ref 37.5–51.0)
Hemoglobin: 15.1 g/dL (ref 13.0–17.7)
Immature Grans (Abs): 0 10*3/uL (ref 0.0–0.1)
Immature Granulocytes: 0 %
Lymphocytes Absolute: 2.8 10*3/uL (ref 0.7–3.1)
Lymphs: 25 %
MCH: 30.7 pg (ref 26.6–33.0)
MCHC: 33.9 g/dL (ref 31.5–35.7)
MCV: 90 fL (ref 79–97)
Monocytes Absolute: 2.2 10*3/uL — ABNORMAL HIGH (ref 0.1–0.9)
Monocytes: 19 %
Neutrophils Absolute: 5.7 10*3/uL (ref 1.4–7.0)
Neutrophils: 49 %
Platelets: 405 10*3/uL (ref 150–450)
RBC: 4.92 x10E6/uL (ref 4.14–5.80)
RDW: 11.8 % (ref 11.6–15.4)
WBC: 11.6 10*3/uL — ABNORMAL HIGH (ref 3.4–10.8)

## 2021-12-06 LAB — LIPID PANEL
Chol/HDL Ratio: 3.7 ratio (ref 0.0–5.0)
Cholesterol, Total: 201 mg/dL — ABNORMAL HIGH (ref 100–199)
HDL: 54 mg/dL (ref >39)
LDL Chol Calc (NIH): 128 mg/dL — ABNORMAL HIGH (ref 0–99)
Triglycerides: 107 mg/dL (ref 0–149)
VLDL Cholesterol Cal: 19 mg/dL (ref 5–40)

## 2021-12-06 LAB — HEMOGLOBIN A1C
Est. average glucose Bld gHb Est-mCnc: 128 mg/dL
Hgb A1c MFr Bld: 6.1 % — ABNORMAL HIGH (ref 4.8–5.6)

## 2021-12-11 ENCOUNTER — Ambulatory Visit: Payer: Self-pay | Admitting: Family

## 2021-12-13 ENCOUNTER — Ambulatory Visit: Payer: Self-pay | Admitting: Gerontology

## 2021-12-13 VITALS — BP 129/78 | HR 76 | Temp 98.3°F | Resp 18 | Ht 68.25 in | Wt 243.3 lb

## 2021-12-13 DIAGNOSIS — E785 Hyperlipidemia, unspecified: Secondary | ICD-10-CM

## 2021-12-13 DIAGNOSIS — R42 Dizziness and giddiness: Secondary | ICD-10-CM

## 2021-12-13 DIAGNOSIS — R7303 Prediabetes: Secondary | ICD-10-CM

## 2021-12-13 NOTE — Progress Notes (Signed)
Established Patient Office Visit  Subjective   Patient ID: Brian Porter, male    DOB: Nov 26, 1965  Age: 56 y.o. MRN: [id number]  Chief Complaint  Patient presents with   Follow-up    Lab results    HPI  Brian Porter is a 56 y/o male who has history of CAD, CHF, GERD, Hypertension, MI, for routine follow up visit, labe review and medication refill.  He states that he is compliant with his medications, denies side effects and continues to make healthy lifestyle changes.  His labs done on 12/05/21, hemoglobin A1c was 6.1%, LDL was 128 mg per DL.  He also c/o of intermittent light headedness, that occurs once in a while when he stands. He reports an episode today that resolved immeidately. He denies ear pain, vertigo, but endorses chronic  intermittent tinnitus that has being going on for 1 year. He denies chest pain, palpitation, shortness of breath and vision changes. Overall, he states that he's doing well and offers no further complaint.  Review of Systems  Constitutional: Negative.   Respiratory: Negative.    Cardiovascular: Negative.   Neurological:  Positive for dizziness.  Psychiatric/Behavioral: Negative.        Objective:     BP 129/78 (BP Location: Left Arm, Patient Position: Sitting, Cuff Size: Large)   Pulse 76   Temp 98.3 F (36.8 C) (Oral)   Resp 18   Ht 5' 8.25" (1.734 m)   Wt 243 lb 4.8 oz (110.4 kg)   SpO2 93%   BMI 36.72 kg/m  BP Readings from Last 3 Encounters:  12/14/21 116/72  12/13/21 129/78  10/18/21 119/77   Wt Readings from Last 3 Encounters:  12/14/21 241 lb (109.3 kg)  12/13/21 243 lb 4.8 oz (110.4 kg)  10/18/21 241 lb 4.8 oz (109.5 kg)      Physical Exam HENT:     Head: Normocephalic and atraumatic.     Right Ear: Tympanic membrane, ear canal and external ear normal. There is no impacted cerumen.     Left Ear: Tympanic membrane, ear canal and external ear normal. There is no impacted cerumen.     Mouth/Throat:     Mouth: Mucous  membranes are moist.  Eyes:     Extraocular Movements: Extraocular movements intact.     Conjunctiva/sclera: Conjunctivae normal.     Pupils: Pupils are equal, round, and reactive to light.  Cardiovascular:     Rate and Rhythm: Normal rate and regular rhythm.     Pulses: Normal pulses.     Heart sounds: Normal heart sounds.  Pulmonary:     Effort: Pulmonary effort is normal.     Breath sounds: Normal breath sounds.  Skin:    General: Skin is warm.  Neurological:     General: No focal deficit present.     Mental Status: He is alert and oriented to person, place, and time. Mental status is at baseline.  Psychiatric:        Mood and Affect: Mood normal.        Behavior: Behavior normal.        Thought Content: Thought content normal.        Judgment: Judgment normal.      No results found for any visits on 12/13/21.  Last CBC Lab Results  Component Value Date   WBC 10.9 (H) 12/14/2021   HGB 15.1 12/14/2021   HCT 45.1 12/14/2021   MCV 92.8 12/14/2021   MCH 31.1 12/14/2021  RDW 13.0 12/14/2021   PLT 413 (H) 12/14/2021   Last metabolic panel Lab Results  Component Value Date   GLUCOSE 166 (H) 12/14/2021   NA 139 12/14/2021   K 3.5 12/14/2021   CL 106 12/14/2021   CO2 25 12/14/2021   BUN 19 12/14/2021   CREATININE 0.88 12/14/2021   EGFR 95 12/05/2021   CALCIUM 8.9 12/14/2021   PROT 7.3 12/14/2021   ALBUMIN 3.9 12/14/2021   LABGLOB 2.4 12/05/2021   AGRATIO 2.0 12/05/2021   BILITOT 1.0 12/14/2021   ALKPHOS 55 12/14/2021   AST 21 12/14/2021   ALT 28 12/14/2021   ANIONGAP 8 12/14/2021   Last lipids Lab Results  Component Value Date   CHOL 201 (H) 12/05/2021   HDL 54 12/05/2021   LDLCALC 128 (H) 12/05/2021   TRIG 107 12/05/2021   CHOLHDL 3.7 12/05/2021   Last hemoglobin A1c Lab Results  Component Value Date   HGBA1C 6.1 (H) 12/05/2021   Last thyroid functions Lab Results  Component Value Date   TSH 1.486 10/04/2021      The ASCVD Risk score  (Arnett DK, et al., 2019) failed to calculate for the following reasons:   The patient has a prior MI or stroke diagnosis    Assessment & Plan:   1. Light-headedness - Unknown etiology of dizziness, was advised to increase water intake, change position slowly,go to the ED for worsening symptoms.  2. Prediabetes - His HgbA1c was 6.1%, he declines metformin therapy, was advised to continue on low carb/non concentrated sweet diet and exercise as tolerated.  3. Elevated lipids - He will continue on current medication, low fat/cholesterol diet and exercise as tolerated.   Return in about 14 weeks (around 03/21/2022), or if symptoms worsen or fail to improve.    Jerrid Forgette Trellis Paganini, NP

## 2021-12-13 NOTE — Patient Instructions (Signed)

## 2021-12-14 ENCOUNTER — Emergency Department: Payer: Self-pay

## 2021-12-14 ENCOUNTER — Other Ambulatory Visit: Payer: Self-pay

## 2021-12-14 ENCOUNTER — Encounter: Payer: Self-pay | Admitting: Intensive Care

## 2021-12-14 ENCOUNTER — Emergency Department
Admission: EM | Admit: 2021-12-14 | Discharge: 2021-12-14 | Disposition: A | Discharge status: Home / Self Care | Payer: Self-pay | Attending: Emergency Medicine | Admitting: Emergency Medicine

## 2021-12-14 DIAGNOSIS — I2510 Atherosclerotic heart disease of native coronary artery without angina pectoris: Secondary | ICD-10-CM | POA: Insufficient documentation

## 2021-12-14 DIAGNOSIS — R42 Dizziness and giddiness: Secondary | ICD-10-CM | POA: Insufficient documentation

## 2021-12-14 DIAGNOSIS — I509 Heart failure, unspecified: Secondary | ICD-10-CM | POA: Insufficient documentation

## 2021-12-14 DIAGNOSIS — D72829 Elevated white blood cell count, unspecified: Secondary | ICD-10-CM | POA: Insufficient documentation

## 2021-12-14 DIAGNOSIS — M542 Cervicalgia: Secondary | ICD-10-CM | POA: Insufficient documentation

## 2021-12-14 DIAGNOSIS — I110 Hypertensive heart disease with heart failure: Secondary | ICD-10-CM | POA: Insufficient documentation

## 2021-12-14 LAB — CBC WITH DIFFERENTIAL/PLATELET
Abs Immature Granulocytes: 0.03 10*3/uL (ref 0.00–0.07)
Basophils Absolute: 0.1 10*3/uL (ref 0.0–0.1)
Basophils Relative: 1 %
Eosinophils Absolute: 0.8 10*3/uL — ABNORMAL HIGH (ref 0.0–0.5)
Eosinophils Relative: 8 %
HCT: 45.1 % (ref 39.0–52.0)
Hemoglobin: 15.1 g/dL (ref 13.0–17.0)
Immature Granulocytes: 0 %
Lymphocytes Relative: 31 %
Lymphs Abs: 3.3 10*3/uL (ref 0.7–4.0)
MCH: 31.1 pg (ref 26.0–34.0)
MCHC: 33.5 g/dL (ref 30.0–36.0)
MCV: 92.8 fL (ref 80.0–100.0)
Monocytes Absolute: 1.4 10*3/uL — ABNORMAL HIGH (ref 0.1–1.0)
Monocytes Relative: 13 %
Neutro Abs: 5.2 10*3/uL (ref 1.7–7.7)
Neutrophils Relative %: 47 %
Platelets: 413 10*3/uL — ABNORMAL HIGH (ref 150–400)
RBC: 4.86 MIL/uL (ref 4.22–5.81)
RDW: 13 % (ref 11.5–15.5)
WBC: 10.9 10*3/uL — ABNORMAL HIGH (ref 4.0–10.5)
nRBC: 0 % (ref 0.0–0.2)

## 2021-12-14 LAB — COMPREHENSIVE METABOLIC PANEL
ALT: 28 U/L (ref 0–44)
AST: 21 U/L (ref 15–41)
Albumin: 3.9 g/dL (ref 3.5–5.0)
Alkaline Phosphatase: 55 U/L (ref 38–126)
Anion gap: 8 (ref 5–15)
BUN: 19 mg/dL (ref 6–20)
CO2: 25 mmol/L (ref 22–32)
Calcium: 8.9 mg/dL (ref 8.9–10.3)
Chloride: 106 mmol/L (ref 98–111)
Creatinine, Ser: 0.88 mg/dL (ref 0.61–1.24)
GFR, Estimated: >60 mL/min (ref >60)
Glucose, Bld: 166 mg/dL — ABNORMAL HIGH (ref 70–99)
Potassium: 3.5 mmol/L (ref 3.5–5.1)
Sodium: 139 mmol/L (ref 135–145)
Total Bilirubin: 1 mg/dL (ref 0.3–1.2)
Total Protein: 7.3 g/dL (ref 6.5–8.1)

## 2021-12-14 LAB — URINALYSIS, ROUTINE W REFLEX MICROSCOPIC
Bilirubin Urine: NEGATIVE
Glucose, UA: NEGATIVE mg/dL
Hgb urine dipstick: NEGATIVE
Ketones, ur: NEGATIVE mg/dL
Leukocytes,Ua: NEGATIVE
Nitrite: NEGATIVE
Protein, ur: NEGATIVE mg/dL
Specific Gravity, Urine: 1.014 (ref 1.005–1.030)
pH: 5 (ref 5.0–8.0)

## 2021-12-14 LAB — TROPONIN I (HIGH SENSITIVITY)
Troponin I (High Sensitivity): 12 ng/L (ref <18)
Troponin I (High Sensitivity): 11 ng/L (ref <18)

## 2021-12-14 LAB — BRAIN NATRIURETIC PEPTIDE: B Natriuretic Peptide: 28 pg/mL (ref 0.0–100.0)

## 2021-12-14 MED ORDER — IOHEXOL 350 MG/ML SOLN
75.0000 mL | Freq: Once | INTRAVENOUS | Status: AC | PRN
  Administered 2021-12-14: 75 mL via INTRAVENOUS

## 2021-12-14 NOTE — ED Notes (Signed)
Pt denied EKG

## 2021-12-14 NOTE — ED Notes (Signed)
Patient refused EKG.

## 2021-12-14 NOTE — ED Triage Notes (Addendum)
Patient c/o ringing in ears, right sided neck pain and lightheadedness. Reports the lightheadedness has been ongoing X6 months that has progressively gotten worse. Denies ear pain or drainage.

## 2021-12-14 NOTE — ED Provider Notes (Signed)
[facility] Provider Note  Patient Contact: 7:25 PM (approximate)   History   Dizziness and Neck Pain (Right/)   HPI  Brian Porter is a 56 y.o. male with a history of hypertension, coronary artery disease, peripheral vascular disease, CHF and GERD presents to the emergency department with concern for progressively worsening dizziness over the past 2 days associated with tinnitus.  Patient was seen and evaluated on 10/04/2021 for similar symptoms and had a MRI brain.  MRI brain was negative for acute stroke.  Patient also states that he has right-sided neck pain without numbness or tingling in the upper and lower extremities and lower extremity pain.  He denies medication changes and states that his dizziness primarily occurs when he is standing.  He denies chest pain, chest tightness, shortness of breath.  No recent illness or recent weight gain.       Physical Exam   Triage Vital Signs: ED Triage Vitals  Enc Vitals Group     BP 12/14/21 1724 117/69     Pulse Rate 12/14/21 1724 92     Resp 12/14/21 1724 16     Temp 12/14/21 1724 98.4 F (36.9 C)     Temp Source 12/14/21 1724 Oral     SpO2 12/14/21 1724 95 %     Weight 12/14/21 1730 241 lb (109.3 kg)     Height 12/14/21 1730 5' 8.5" (1.74 m)     Head Circumference --      Peak Flow --      Pain Score 12/14/21 1729 4     Pain Loc --      Pain Edu? --      Excl. in GC? --     Most recent vital signs: Vitals:   12/14/21 1724 12/14/21 2043  BP: 117/69 116/72  Pulse: 92 66  Resp: 16 16  Temp: 98.4 F (36.9 C)   SpO2: 95% 93%     General: Alert and in no acute distress. Eyes:  PERRL. EOMI. Head: No acute traumatic findings ENT:      Nose: No congestion/rhinnorhea.      Mouth/Throat: Mucous membranes are moist. Neck: No stridor. No cervical spine tenderness to palpation. Cardiovascular:  Good peripheral perfusion Respiratory: Normal respiratory effort without tachypnea or retractions. Lungs  CTAB. Good air entry to the bases with no decreased or absent breath sounds. Gastrointestinal: Bowel sounds 4 quadrants. Soft and nontender to palpation. No guarding or rigidity. No palpable masses. No distention. No CVA tenderness. Musculoskeletal: Patient has symmetric strength in the upper and lower extremities.  Full range of motion to all extremities.  Neurologic: Cranial nerves II through XII are intact.  No gross focal neurologic deficits are appreciated.  Skin:   No rash noted Other:   ED Results / Procedures / Treatments   Labs (all labs ordered are listed, but only abnormal results are displayed) Labs Reviewed  CBC WITH DIFFERENTIAL/PLATELET - Abnormal; Notable for the following components:      Result Value   WBC 10.9 (*)    Platelets 413 (*)    Monocytes Absolute 1.4 (*)    Eosinophils Absolute 0.8 (*)    All other components within normal limits  COMPREHENSIVE METABOLIC PANEL - Abnormal; Notable for the following components:   Glucose, Bld 166 (*)    All other components within normal limits  URINALYSIS, ROUTINE W REFLEX MICROSCOPIC - Abnormal; Notable for the following components:   Color, Urine YELLOW (*)  APPearance CLEAR (*)    All other components within normal limits  BRAIN NATRIURETIC PEPTIDE  TROPONIN I (HIGH SENSITIVITY)  TROPONIN I (HIGH SENSITIVITY)        RADIOLOGY  I personally viewed and evaluated these images as part of my medical decision making, as well as reviewing the written report by the radiologist.  ED Provider Interpretation: I personally interpreted CT angio head and neck and agree with radiologist interpretation.  Findings largely consistent with prior CT angio head and neck from April 2022  PROCEDURES:  Critical Care performed: No  Procedures   MEDICATIONS ORDERED IN ED: Medications  iohexol (OMNIPAQUE) 350 MG/ML injection 75 mL (75 mLs Intravenous Contrast Given 12/14/21 2058)     IMPRESSION / MDM / ASSESSMENT AND PLAN  / ED COURSE  I reviewed the triage vital signs and the nursing notes.                             Assessment and plan Dizziness, tinnitus 56 year old male presents to the emergency department with multiple medical complaints including lightheadedness, right-sided neck pain.  Vital signs were reassuring at triage.  On exam, patient was alert, active and nontoxic-appearing with no apparent neurodeficits noted.  I discussed patient case with my attending Dr. Katrinka Blazing and we agreed to repeat CT angio head and neck which indicated stable mild carotid atherosclerosis and stable moderate stenosis of P2 segment. Patient has mild distal small vessel disease without stenosis, occlusion or aneurysm  CBC indicated very mildly elevated white blood cell count but was otherwise reassuring.  CMP, troponin and BNP within reference range.  Given chronic nature of patient's lightheadedness, I feel the patient is appropriate for follow-up with primary care.  I did recommend against sleeping in recliner as patient's wife states that patient's neck is in abnormal position most nights.  Patient stated that he would try these recommendations at home.  Return precautions were given to return with new or worsening symptoms.   Clinical Course as of 12/14/21 2156  Fri Dec 14, 2021  1902 Ketones, ur: NEGATIVE [JW]    Clinical Course User Index [JW] Orvil Feil, [place]     FINAL CLINICAL IMPRESSION(S) / ED DIAGNOSES   Final diagnoses:  Light headedness     Rx / DC Orders   ED Discharge Orders          Ordered    CT ANGIO HEAD NECK W WO CM        12/14/21 1910             Note:  This document was prepared using Dragon voice recognition software and may include unintentional dictation errors.   Pia Mau [place], Cordelia Poche 12/14/21 2157    Gilles Chiquito, MD 12/15/21 1053

## 2022-01-01 NOTE — Progress Notes (Unsigned)
Patient ID: Brian Porter, male    DOB: 03-25-1966, 56 y.o.   MRN: [id number]   Brian Porter is a 56 y/o male with a history of obstructive sleep apnea (without CPAP), CAD, HTN, GERD, MI, current tobacco use and chronic heart failure.   Echo report from 10/22/20 reviewed and showed an EF of 25-30% along with mild Brian/AR. Echo report from 05/24/2019 reviewed and showed an EF of 30-35% along with trivial TR/PR. Echo done 10/15/15 and showed an EF of 35% along with moderate Brian/TR and an elevated PA pressure of 48 mm Hg.   Had a cardiac catheterization done 10/16/15 and showed an EF of 15% along with multi-vessel disease. Recommended treatment for cardiomyopathy.   Was in the ED 12/14/21 due to worsening dizziness with tinnitus. Evaluated and released.   He presents today for a follow-up visit with a chief complaint of minimal shortness of breath with moderate exertion. Describes this as chronic in nature. He has associated pedal edema, dizziness and slight weight gain. He denies any difficulty sleeping, abdominal distention, palpitations, chest pain, cough or fatigue.   His biggest complaint is of continued dizziness. Says that even if he's standing for a period of time, he can get dizzy, doesn't have to change positions suddenly for it to occur.   Past Medical History:  Diagnosis Date   CAD (coronary artery disease)    CHF (congestive heart failure) (HCC)    GERD (gastroesophageal reflux disease)    Hypertension    MI, old    Sleep apnea    Past Surgical History:  Procedure Laterality Date   CARDIAC CATHETERIZATION Right 10/16/2015   Procedure: Left Heart Cath and Coronary Angiography;  Surgeon: Laurier Nancy, MD;  Location: [facility] INVASIVE CV LAB;  Service: Cardiovascular;  Laterality: Right;   CORONARY ANGIOPLASTY WITH STENT PLACEMENT  approx 2 years ago   HERNIA REPAIR Left 11/29/2016   [facility]   PARTIAL NEPHRECTOMY Left    SPLENECTOMY     Family History  Problem Relation Age of Onset    Hypertension Mother    Other Mother        covid pneumonia   Congestive Heart Failure Mother    Heart attack Father 32   Diabetes Father    Hypertension Father    Diabetes Mellitus II Brother    Social History   Tobacco Use   Smoking status: Former    Packs/day: 0.50    Types: Cigarettes    Quit date: 10/24/2020    Years since quitting: 1.1   Smokeless tobacco: Never  Substance Use Topics   Alcohol use: No    Alcohol/week: 0.0 standard drinks of alcohol   Allergies  Allergen Reactions   Jardiance [Empagliflozin] Diarrhea   Entresto [Sacubitril-Valsartan] Cough   Prior to Admission medications   Medication Sig Start Date End Date Taking? Authorizing Provider  acetaminophen (TYLENOL) 650 MG CR tablet Take 650 mg by mouth every 8 (eight) hours as needed for pain.   Yes [provider]  aspirin 81 MG EC tablet Take 1 tablet (81 mg total) by mouth daily. 10/23/20  Yes Elgergawy, Leana Roe, MD  atorvastatin (LIPITOR) 20 MG tablet Take 1 tablet (20 mg total) by mouth daily. 07/11/21  Yes Iloabachie, Chioma E, NP  furosemide (LASIX) 40 MG tablet Take 1 tablet (40 mg total) by mouth daily. 08/08/21  Yes Clarisa Kindred A, FNP  gabapentin (NEURONTIN) 100 MG capsule Take 1 capsule (100 mg total) by mouth 3 (three)  times daily. Patient taking differently: Take 100 mg by mouth 3 (three) times daily. As needed 09/26/21  Yes Iloabachie, Chioma E, NP  losartan (COZAAR) 25 MG tablet Take 1 tablet (25 mg total) by mouth daily. 09/26/21  Yes Iloabachie, Chioma E, NP  metoprolol succinate (TOPROL-XL) 25 MG 24 hr tablet Take 1 tablet by mouth once daily 12/03/21  Yes Ezmeralda Stefanick A, FNP  potassium chloride (KLOR-CON M) 10 MEQ tablet Take 1/2 (one-half) tablet by mouth once daily 11/03/21  Yes Deke Tilghman A, FNP  spironolactone (ALDACTONE) 25 MG tablet Take 1 tablet (25 mg total) by mouth daily. 08/08/21  Yes Delma Freeze, FNP    Review of Systems  Constitutional:  Negative for appetite change and  fatigue.  HENT:  Negative for congestion, postnasal drip and sore throat.   Eyes: Negative.   Respiratory:  Positive for shortness of breath (minimal). Negative for cough and chest tightness.   Cardiovascular:  Positive for leg swelling (minimal). Negative for chest pain and palpitations.  Gastrointestinal:  Negative for abdominal distention and abdominal pain.  Endocrine: Negative.   Genitourinary: Negative.   Musculoskeletal:  Positive for myalgias (right leg). Negative for back pain.  Skin: Negative.   Allergic/Immunologic: Negative.   Neurological:  Positive for dizziness ("all the time"). Negative for light-headedness.  Hematological:  Negative for adenopathy. Does not bruise/bleed easily.  Psychiatric/Behavioral:  Negative for dysphoric mood and sleep disturbance (sleeping on 2 pillows). The patient is not nervous/anxious.    Vitals:   01/02/22 1314  BP: 123/85  Pulse: 76  Resp: 16  SpO2: 98%  Weight: 243 lb 4 oz (110.3 kg)  Height: 5\' 9"  (1.753 m)   Wt Readings from Last 3 Encounters:  01/02/22 243 lb 4 oz (110.3 kg)  12/14/21 241 lb (109.3 kg)  12/13/21 243 lb 4.8 oz (110.4 kg)   Lab Results  Component Value Date   CREATININE 0.88 12/14/2021   CREATININE 0.94 12/05/2021   CREATININE 0.92 10/04/2021   Physical Exam Vitals and nursing note reviewed.  Constitutional:      Appearance: He is well-developed.  HENT:     Head: Normocephalic and atraumatic.  Neck:     Vascular: No JVD.  Cardiovascular:     Rate and Rhythm: Normal rate and regular rhythm.  Pulmonary:     Effort: Pulmonary effort is normal.     Breath sounds: No wheezing or rales.  Abdominal:     General: There is no distension.     Palpations: Abdomen is soft.     Tenderness: There is no abdominal tenderness.  Musculoskeletal:        General: No tenderness.     Cervical back: Normal range of motion and neck supple.     Right lower leg: Edema (1+ pitting) present.     Left lower leg: Edema (1+  pitting) present.  Skin:    General: Skin is warm and dry.  Neurological:     Mental Status: He is alert and oriented to person, place, and time.  Psychiatric:        Behavior: Behavior normal.        Thought Content: Thought content normal.      Assessment & Plan:   1: Chronic heart failure with reduced ejection fraction- - NYHA class II - euvolemic in appearance  - weighing daily. Reminded to call for an overnight weight gain of >2 pounds or a weekly weight gain of >5 pounds.  - weight up 4 pounds  since he was last here 6 months ago - currently on GDMT of losartan, metoprolol & spironolactone - unable to tolerate entresto or jardiance; tolerated farxiga but wasn't approved for patient assistance so he can't afford to take it - has been adding a little salt and admits that he could be doing better with monitoring it; encouraged to not add any salt to his food - encouraged to wear compression socks daily with putting them on every morning and removing at bedtime - BNP 12/14/21 was 28.0  2: HTN- - BP looks good (123/85) - saw provider at [facility] 12/13/21 - reviewed BMP from 12/14/21 which showed sodium 139, potassium 3.5, creatinine 0.83 and GFR >60   Medication bottles reviewed.   Return in 6 months, sooner if needed

## 2022-01-02 ENCOUNTER — Ambulatory Visit: Payer: Self-pay | Attending: Family | Admitting: Family

## 2022-01-02 ENCOUNTER — Encounter: Payer: Self-pay | Admitting: Family

## 2022-01-02 VITALS — BP 123/85 | HR 76 | Resp 16 | Ht 69.0 in | Wt 243.2 lb

## 2022-01-02 DIAGNOSIS — I10 Essential (primary) hypertension: Secondary | ICD-10-CM

## 2022-01-02 DIAGNOSIS — K219 Gastro-esophageal reflux disease without esophagitis: Secondary | ICD-10-CM | POA: Insufficient documentation

## 2022-01-02 DIAGNOSIS — G4733 Obstructive sleep apnea (adult) (pediatric): Secondary | ICD-10-CM | POA: Insufficient documentation

## 2022-01-02 DIAGNOSIS — R42 Dizziness and giddiness: Secondary | ICD-10-CM | POA: Insufficient documentation

## 2022-01-02 DIAGNOSIS — Z955 Presence of coronary angioplasty implant and graft: Secondary | ICD-10-CM | POA: Insufficient documentation

## 2022-01-02 DIAGNOSIS — I252 Old myocardial infarction: Secondary | ICD-10-CM | POA: Insufficient documentation

## 2022-01-02 DIAGNOSIS — Z7984 Long term (current) use of oral hypoglycemic drugs: Secondary | ICD-10-CM | POA: Insufficient documentation

## 2022-01-02 DIAGNOSIS — I110 Hypertensive heart disease with heart failure: Secondary | ICD-10-CM | POA: Insufficient documentation

## 2022-01-02 DIAGNOSIS — I5022 Chronic systolic (congestive) heart failure: Secondary | ICD-10-CM | POA: Insufficient documentation

## 2022-01-02 DIAGNOSIS — Z79899 Other long term (current) drug therapy: Secondary | ICD-10-CM | POA: Insufficient documentation

## 2022-01-02 DIAGNOSIS — I2510 Atherosclerotic heart disease of native coronary artery without angina pectoris: Secondary | ICD-10-CM | POA: Insufficient documentation

## 2022-01-02 DIAGNOSIS — F17210 Nicotine dependence, cigarettes, uncomplicated: Secondary | ICD-10-CM | POA: Insufficient documentation

## 2022-01-02 NOTE — Patient Instructions (Addendum)
Continue weighing daily and call for an overnight weight gain of 3 pounds or more or a weekly weight gain of more than 5 pounds.   If you have voicemail, please make sure your mailbox is cleaned out so that we may leave a message and please make sure to listen to any voicemails.    Get compression socks from the pharmacy and put them on every morning with removal at bedtime

## 2022-01-22 ENCOUNTER — Other Ambulatory Visit: Payer: Self-pay | Admitting: Gerontology

## 2022-01-22 ENCOUNTER — Other Ambulatory Visit: Payer: Self-pay | Admitting: Family

## 2022-01-22 DIAGNOSIS — E785 Hyperlipidemia, unspecified: Secondary | ICD-10-CM

## 2022-01-22 MED ORDER — ATORVASTATIN CALCIUM 20 MG PO TABS: 20.0000 mg | ORAL_TABLET | Freq: Every day | ORAL | 3 refills | Status: DC

## 2022-02-14 ENCOUNTER — Other Ambulatory Visit: Payer: Self-pay

## 2022-02-14 ENCOUNTER — Emergency Department: Payer: Self-pay

## 2022-02-14 ENCOUNTER — Inpatient Hospital Stay
Admission: EM | Admit: 2022-02-14 | Discharge: 2022-02-15 | DRG: 281 | Disposition: A | Discharge status: Home / Self Care | Payer: Self-pay | Attending: Obstetrics and Gynecology | Admitting: Obstetrics and Gynecology

## 2022-02-14 DIAGNOSIS — I10 Essential (primary) hypertension: Secondary | ICD-10-CM

## 2022-02-14 DIAGNOSIS — Z79899 Other long term (current) drug therapy: Secondary | ICD-10-CM

## 2022-02-14 DIAGNOSIS — I420 Dilated cardiomyopathy: Secondary | ICD-10-CM | POA: Diagnosis present

## 2022-02-14 DIAGNOSIS — I110 Hypertensive heart disease with heart failure: Secondary | ICD-10-CM | POA: Diagnosis present

## 2022-02-14 DIAGNOSIS — Z955 Presence of coronary angioplasty implant and graft: Secondary | ICD-10-CM

## 2022-02-14 DIAGNOSIS — Z888 Allergy status to other drugs, medicaments and biological substances status: Secondary | ICD-10-CM

## 2022-02-14 DIAGNOSIS — Z7982 Long term (current) use of aspirin: Secondary | ICD-10-CM

## 2022-02-14 DIAGNOSIS — I739 Peripheral vascular disease, unspecified: Secondary | ICD-10-CM

## 2022-02-14 DIAGNOSIS — I639 Cerebral infarction, unspecified: Secondary | ICD-10-CM | POA: Diagnosis present

## 2022-02-14 DIAGNOSIS — E785 Hyperlipidemia, unspecified: Secondary | ICD-10-CM

## 2022-02-14 DIAGNOSIS — Z8249 Family history of ischemic heart disease and other diseases of the circulatory system: Secondary | ICD-10-CM

## 2022-02-14 DIAGNOSIS — I214 Non-ST elevation (NSTEMI) myocardial infarction: Secondary | ICD-10-CM

## 2022-02-14 DIAGNOSIS — M542 Cervicalgia: Secondary | ICD-10-CM | POA: Diagnosis present

## 2022-02-14 DIAGNOSIS — K219 Gastro-esophageal reflux disease without esophagitis: Secondary | ICD-10-CM | POA: Diagnosis present

## 2022-02-14 DIAGNOSIS — I878 Other specified disorders of veins: Secondary | ICD-10-CM | POA: Diagnosis present

## 2022-02-14 DIAGNOSIS — I252 Old myocardial infarction: Secondary | ICD-10-CM

## 2022-02-14 DIAGNOSIS — Z8673 Personal history of transient ischemic attack (TIA), and cerebral infarction without residual deficits: Secondary | ICD-10-CM | POA: Diagnosis present

## 2022-02-14 DIAGNOSIS — I200 Unstable angina: Principal | ICD-10-CM

## 2022-02-14 DIAGNOSIS — I5022 Chronic systolic (congestive) heart failure: Secondary | ICD-10-CM

## 2022-02-14 DIAGNOSIS — Z87891 Personal history of nicotine dependence: Secondary | ICD-10-CM

## 2022-02-14 DIAGNOSIS — Z6833 Body mass index (BMI) 33.0-33.9, adult: Secondary | ICD-10-CM

## 2022-02-14 DIAGNOSIS — Z833 Family history of diabetes mellitus: Secondary | ICD-10-CM

## 2022-02-14 DIAGNOSIS — Z720 Tobacco use: Secondary | ICD-10-CM | POA: Diagnosis present

## 2022-02-14 DIAGNOSIS — E669 Obesity, unspecified: Secondary | ICD-10-CM | POA: Diagnosis present

## 2022-02-14 DIAGNOSIS — G4733 Obstructive sleep apnea (adult) (pediatric): Secondary | ICD-10-CM | POA: Diagnosis present

## 2022-02-14 DIAGNOSIS — Z9081 Acquired absence of spleen: Secondary | ICD-10-CM

## 2022-02-14 DIAGNOSIS — I959 Hypotension, unspecified: Secondary | ICD-10-CM | POA: Diagnosis present

## 2022-02-14 DIAGNOSIS — Z560 Unemployment, unspecified: Secondary | ICD-10-CM

## 2022-02-14 DIAGNOSIS — Z905 Acquired absence of kidney: Secondary | ICD-10-CM

## 2022-02-14 DIAGNOSIS — I25110 Atherosclerotic heart disease of native coronary artery with unstable angina pectoris: Secondary | ICD-10-CM | POA: Diagnosis present

## 2022-02-14 HISTORY — DX: Non-ST elevation (NSTEMI) myocardial infarction: I21.4

## 2022-02-14 LAB — CBC
HCT: 43.8 % (ref 39.0–52.0)
Hemoglobin: 14.7 g/dL (ref 13.0–17.0)
MCH: 31.1 pg (ref 26.0–34.0)
MCHC: 33.6 g/dL (ref 30.0–36.0)
MCV: 92.6 fL (ref 80.0–100.0)
Platelets: 371 10*3/uL (ref 150–400)
RBC: 4.73 MIL/uL (ref 4.22–5.81)
RDW: 12.8 % (ref 11.5–15.5)
WBC: 10.8 10*3/uL — ABNORMAL HIGH (ref 4.0–10.5)
nRBC: 0 % (ref 0.0–0.2)

## 2022-02-14 LAB — APTT: aPTT: 25 seconds (ref 24–36)

## 2022-02-14 LAB — TROPONIN I (HIGH SENSITIVITY)
Troponin I (High Sensitivity): 389 ng/L (ref <18)
Troponin I (High Sensitivity): 442 ng/L (ref <18)
Troponin I (High Sensitivity): 558 ng/L (ref <18)

## 2022-02-14 LAB — BASIC METABOLIC PANEL
Anion gap: 7 (ref 5–15)
BUN: 19 mg/dL (ref 6–20)
CO2: 24 mmol/L (ref 22–32)
Calcium: 8.9 mg/dL (ref 8.9–10.3)
Chloride: 108 mmol/L (ref 98–111)
Creatinine, Ser: 0.68 mg/dL (ref 0.61–1.24)
GFR, Estimated: >60 mL/min (ref >60)
Glucose, Bld: 94 mg/dL (ref 70–99)
Potassium: 4 mmol/L (ref 3.5–5.1)
Sodium: 139 mmol/L (ref 135–145)

## 2022-02-14 LAB — BRAIN NATRIURETIC PEPTIDE: B Natriuretic Peptide: 70.9 pg/mL (ref 0.0–100.0)

## 2022-02-14 LAB — PROTIME-INR
INR: 1.1 (ref 0.8–1.2)
Prothrombin Time: 13.6 seconds (ref 11.4–15.2)

## 2022-02-14 LAB — HEPARIN LEVEL (UNFRACTIONATED): Heparin Unfractionated: 0.12 IU/mL — ABNORMAL LOW (ref 0.30–0.70)

## 2022-02-14 MED ORDER — FUROSEMIDE 10 MG/ML IJ SOLN
40.0000 mg | Freq: Two times a day (BID) | INTRAMUSCULAR | Status: AC
  Administered 2022-02-14 – 2022-02-15 (×2): 40 mg via INTRAVENOUS
  Filled 2022-02-14 (×2): qty 4

## 2022-02-14 MED ORDER — SPIRONOLACTONE 25 MG PO TABS
25.0000 mg | ORAL_TABLET | Freq: Every day | ORAL | Status: DC
  Administered 2022-02-14 – 2022-02-15 (×2): 25 mg via ORAL
  Filled 2022-02-14 (×2): qty 1

## 2022-02-14 MED ORDER — GABAPENTIN 100 MG PO CAPS
100.0000 mg | ORAL_CAPSULE | Freq: Three times a day (TID) | ORAL | Status: DC
  Administered 2022-02-14 – 2022-02-15 (×3): 100 mg via ORAL
  Filled 2022-02-14 (×3): qty 1

## 2022-02-14 MED ORDER — NITROGLYCERIN 0.4 MG SL SUBL: 0.4000 mg | SUBLINGUAL_TABLET | SUBLINGUAL | Status: DC | PRN

## 2022-02-14 MED ORDER — ACETAMINOPHEN 325 MG PO TABS
650.0000 mg | ORAL_TABLET | Freq: Four times a day (QID) | ORAL | Status: DC | PRN
  Administered 2022-02-14 – 2022-02-15 (×2): 650 mg via ORAL
  Filled 2022-02-14 (×2): qty 2

## 2022-02-14 MED ORDER — ATORVASTATIN CALCIUM 20 MG PO TABS: 20.0000 mg | ORAL_TABLET | Freq: Every day | ORAL | Status: DC

## 2022-02-14 MED ORDER — HEPARIN (PORCINE) 25000 UT/250ML-% IV SOLN
1600.0000 [IU]/h | INTRAVENOUS | Status: DC
  Administered 2022-02-14: 1250 [IU]/h via INTRAVENOUS
  Administered 2022-02-15: 1600 [IU]/h via INTRAVENOUS
  Filled 2022-02-14 (×2): qty 250

## 2022-02-14 MED ORDER — NITROGLYCERIN 2 % TD OINT
1.0000 [in_us] | TOPICAL_OINTMENT | Freq: Once | TRANSDERMAL | Status: AC
  Administered 2022-02-14: 1 [in_us] via TOPICAL
  Filled 2022-02-14: qty 1

## 2022-02-14 MED ORDER — ASPIRIN 81 MG PO TBEC: 81.0000 mg | DELAYED_RELEASE_TABLET | Freq: Every day | ORAL | Status: DC

## 2022-02-14 MED ORDER — FUROSEMIDE 40 MG PO TABS: 40.0000 mg | ORAL_TABLET | Freq: Every day | ORAL | Status: DC

## 2022-02-14 MED ORDER — ACETAMINOPHEN 325 MG PO TABS
650.0000 mg | ORAL_TABLET | Freq: Once | ORAL | Status: AC
  Administered 2022-02-14: 650 mg via ORAL
  Filled 2022-02-14: qty 2

## 2022-02-14 MED ORDER — HEPARIN BOLUS VIA INFUSION
2800.0000 [IU] | Freq: Once | INTRAVENOUS | Status: AC
  Administered 2022-02-14: 2800 [IU] via INTRAVENOUS
  Filled 2022-02-14: qty 2800

## 2022-02-14 MED ORDER — LOSARTAN POTASSIUM 50 MG PO TABS
25.0000 mg | ORAL_TABLET | Freq: Every day | ORAL | Status: DC
Start: 2022-02-14 — End: 2022-02-14

## 2022-02-14 MED ORDER — HEPARIN BOLUS VIA INFUSION
4000.0000 [IU] | Freq: Once | INTRAVENOUS | Status: AC
  Administered 2022-02-14: 4000 [IU] via INTRAVENOUS
  Filled 2022-02-14: qty 4000

## 2022-02-14 MED ORDER — METOPROLOL SUCCINATE ER 25 MG PO TB24
25.0000 mg | ORAL_TABLET | Freq: Every day | ORAL | Status: DC
  Administered 2022-02-14 – 2022-02-15 (×2): 25 mg via ORAL
  Filled 2022-02-14 (×2): qty 1

## 2022-02-14 MED ORDER — NITROGLYCERIN 0.4 MG/SPRAY TL SOLN: 1.0000 | Status: DC | PRN

## 2022-02-14 MED ORDER — MORPHINE SULFATE (PF) 2 MG/ML IV SOLN
2.0000 mg | INTRAVENOUS | Status: DC | PRN
  Administered 2022-02-14: 2 mg via INTRAVENOUS
  Filled 2022-02-14: qty 1

## 2022-02-14 MED ORDER — ASPIRIN 325 MG PO TBEC: 325.0000 mg | DELAYED_RELEASE_TABLET | Freq: Every day | ORAL | Status: DC

## 2022-02-14 MED ORDER — ASPIRIN 81 MG PO CHEW
243.0000 mg | CHEWABLE_TABLET | Freq: Once | ORAL | Status: AC
  Administered 2022-02-14: 162 mg via ORAL
  Filled 2022-02-14: qty 3

## 2022-02-14 MED ORDER — POTASSIUM CHLORIDE CRYS ER 10 MEQ PO TBCR
10.0000 meq | EXTENDED_RELEASE_TABLET | Freq: Every day | ORAL | Status: DC
  Administered 2022-02-14 – 2022-02-15 (×2): 10 meq via ORAL
  Filled 2022-02-14 (×2): qty 1

## 2022-02-14 MED ORDER — ATORVASTATIN CALCIUM 80 MG PO TABS
80.0000 mg | ORAL_TABLET | Freq: Every day | ORAL | Status: DC
  Administered 2022-02-14 – 2022-02-15 (×2): 80 mg via ORAL
  Filled 2022-02-14: qty 1
  Filled 2022-02-14: qty 4

## 2022-02-14 NOTE — Assessment & Plan Note (Signed)
-   We will continue his antihypertensives. 

## 2022-02-14 NOTE — Assessment & Plan Note (Signed)
-  We will continue Aldactone, Toprol-XL and ARB therapy.

## 2022-02-14 NOTE — ED Triage Notes (Signed)
Pt come with c/o mid sternal COP that stated yesterday and has gotten worse. Pt states 6/10 pain. Pt states he got dizzy.

## 2022-02-14 NOTE — Assessment & Plan Note (Addendum)
-   The patient will be admitted to a PCU bed. - We will continue him on IV heparin. - We will place on high-dose statin. - As needed sublingual nitroglycerin and IV morphine sulfate will be provided. -He will be on aspirin and will continue beta-blocker therapy as well as ARB therapy. -Cardiology consult and 2D echo will be obtained. - I notified Dr. Darrold Junker about the patient

## 2022-02-14 NOTE — Consult Note (Signed)
ANTICOAGULATION CONSULT NOTE  Pharmacy Consult for Heparin Indication: chest pain/ACS  Allergies  Allergen Reactions   Jardiance [Empagliflozin] Diarrhea   Entresto [Sacubitril-Valsartan] Cough    Patient Measurements: Height: 5\' 9"  (175.3 cm) Weight: 104.3 kg (230 lb) IBW/kg (Calculated) : 70.7 Heparin Dosing Weight: 93.2 kg  Vital Signs: Temp: 98 F (36.7 C) (08/17 1254) Temp Source: Oral (08/17 1254) BP: 132/85 (08/17 1254) Pulse Rate: 75 (08/17 1254)  Labs: Recent Labs    02/14/22 1256 02/14/22 1509  HGB 14.7  --   HCT 43.8  --   PLT 371  --   APTT  --  25  LABPROT  --  13.6  INR  --  1.1  CREATININE 0.68  --   TROPONINIHS 389* 442*    Estimated Creatinine Clearance: 121.2 mL/min (by C-G formula based on SCr of 0.68 mg/dL).   Medical History: Past Medical History:  Diagnosis Date   CAD (coronary artery disease)    CHF (congestive heart failure) (HCC)    GERD (gastroesophageal reflux disease)    Hypertension    MI, old    Sleep apnea     Medications:  No history of chronic anticoagulant use PTA  Assessment: Pharmacy has been consulted to initiate and monitor heparin in 56yo male with history of CHF as well as CAD(not on [facility] PTA) presenting to the ED for evaluation of midsternal non-radiating chest pain and pressure over the past 2 days. Patient with troponin levels 389>442.   Baseline labs: aPTT 25 sec, INR 1.1, Hgb 14.7, Plts 371.  Goal of Therapy:  Heparin level 0.3-0.7 units/ml Monitor platelets by anticoagulation protocol: Yes   Plan:  Give 4000 units bolus x 1 Start heparin infusion at 1250 units/hr Check anti-Xa level in 6 hours and daily while on heparin Continue to monitor H&H and platelets  Brian Porter Brian Porter 02/14/2022,4:15 PM

## 2022-02-14 NOTE — ED Notes (Signed)
Pt brought to ED room 35 at this time, this RN now assuming care.

## 2022-02-14 NOTE — ED Notes (Signed)
MD Mchugh informed of pt's trop of 389

## 2022-02-14 NOTE — ED Provider Notes (Signed)
[facility] Provider Note    Event Date/Time   First MD Initiated Contact with Patient 02/14/22 1422     (approximate)   History   Chest Pain   HPI  Brian Porter is a 56 y.o. male extensive history of CHF as well as CAD not on anticoagulation presents to the ER for evaluation of midsternal nonradiating chest pain and pressure to his burning sensation intermittent over the past day or 2 states he was on a riding lawnmower today and had worsening pain lasting 15 to 20 minutes associated with diaphoresis and near syncopal symptoms.  Feels like he has been having more frequent episodes with any ambulation over the past 24 to 48 hours.  States at rest his symptoms are mild.    Physical Exam   Triage Vital Signs: ED Triage Vitals  Enc Vitals Group     BP 02/14/22 1254 132/85     Pulse Rate 02/14/22 1254 75     Resp 02/14/22 1254 18     Temp 02/14/22 1254 98 F (36.7 C)     Temp Source 02/14/22 1254 Oral     SpO2 02/14/22 1254 95 %     Weight 02/14/22 1254 230 lb (104.3 kg)     Height 02/14/22 1254 5\' 9"  (1.753 m)     Head Circumference --      Peak Flow --      Pain Score 02/14/22 1248 6     Pain Loc --      Pain Edu? --      Excl. in GC? --     Most recent vital signs: Vitals:   02/14/22 1254  BP: 132/85  Pulse: 75  Resp: 18  Temp: 98 F (36.7 C)  SpO2: 95%     Constitutional: Alert  Eyes: Conjunctivae are normal.  Head: Atraumatic. Nose: No congestion/rhinnorhea. Mouth/Throat: Mucous membranes are moist.   Neck: Painless ROM.  Cardiovascular:   Good peripheral circulation. Respiratory: Normal respiratory effort.  No retractions.  Gastrointestinal: Soft and nontender.  Musculoskeletal:  no deformity Neurologic:  MAE spontaneously. No gross focal neurologic deficits are appreciated.  Skin:  Skin is warm, dry and intact. No rash noted. Psychiatric: Mood and affect are normal. Speech and behavior are normal.    ED Results /  Procedures / Treatments   Labs (all labs ordered are listed, but only abnormal results are displayed) Labs Reviewed  CBC - Abnormal; Notable for the following components:      Result Value   WBC 10.8 (*)    All other components within normal limits  TROPONIN I (HIGH SENSITIVITY) - Abnormal; Notable for the following components:   Troponin I (High Sensitivity) 389 (*)    All other components within normal limits  BASIC METABOLIC PANEL  TROPONIN I (HIGH SENSITIVITY)     EKG  ED ECG REPORT I, 02/16/22, the attending physician, personally viewed and interpreted this ECG.   Date: 02/14/2022  EKG Time: 12:52  Rate: 75  Rhythm: sinus  Axis: left  Intervals:normal  ST&T Change: no stemi, no depressions    RADIOLOGY Please see ED Course for my review and interpretation.  I personally reviewed all radiographic images ordered to evaluate for the above acute complaints and reviewed radiology reports and findings.  These findings were personally discussed with the patient.  Please see medical record for radiology report.    PROCEDURES:  Critical Care performed: Yes, see critical care procedure note(s)  .Critical Care  Performed by: Willy Eddy, MD Authorized by: Willy Eddy, MD   Critical care provider statement:    Critical care time (minutes):  35   Critical care was necessary to treat or prevent imminent or life-threatening deterioration of the following conditions:  Cardiac failure   Critical care was time spent personally by me on the following activities:  Ordering and performing treatments and interventions, ordering and review of laboratory studies, ordering and review of radiographic studies, pulse oximetry, re-evaluation of patient's condition, review of old charts, obtaining history from patient or surrogate, examination of patient, evaluation of patient's response to treatment, discussions with primary provider, discussions with consultants and  development of treatment plan with patient or surrogate    MEDICATIONS ORDERED IN ED: Medications  aspirin chewable tablet 243 mg (has no administration in time range)  nitroGLYCERIN (NITROGLYN) 2 % ointment 1 inch (has no administration in time range)  acetaminophen (TYLENOL) tablet 650 mg (has no administration in time range)     IMPRESSION / MDM / ASSESSMENT AND PLAN / ED COURSE  I reviewed the triage vital signs and the nursing notes.                              Differential diagnosis includes, but is not limited to, ACS, pericarditis, esophagitis, boerhaaves, pe, dissection, pna, bronchitis, costochondritis  Presented to the ER for evaluation of symptoms as described above.  This presenting complaint could reflect a potentially life-threatening illness therefore the patient will be placed on continuous pulse oximetry and telemetry for monitoring.  Laboratory evaluation will be sent to evaluate for the above complaints.  EKG was with nonspecific changes no STEMI criteria but patient gives a good history for ACS/unstable angina.  His initial troponin is significantly elevated.  Reviewed his past medical history and does have significant CAD with most recent cath being in 2017.  Does not appear to be in acute heart failure.  Seems less consistent with PE or dissection.  Abdominal exam is soft and benign.  Will give remainder of his aspirin for full dose as well as heparin and nitro.  Will consult hospitalist for admission.  Will consult cardiology for further recommendations.     FINAL CLINICAL IMPRESSION(S) / ED DIAGNOSES   Final diagnoses:  Unstable angina pectoris (HCC)     Rx / DC Orders   ED Discharge Orders     None        Note:  This document was prepared using Dragon voice recognition software and may include unintentional dictation errors.    Willy Eddy, MD 02/14/22 [phone]

## 2022-02-14 NOTE — Consult Note (Signed)
ANTICOAGULATION CONSULT NOTE  Pharmacy Consult for Heparin Indication: chest pain/ACS  Allergies  Allergen Reactions   Jardiance [Empagliflozin] Diarrhea   Entresto [Sacubitril-Valsartan] Cough    Patient Measurements: Height: 5\' 9"  (175.3 cm) Weight: 104.3 kg (230 lb) IBW/kg (Calculated) : 70.7 Heparin Dosing Weight: 93.2 kg  Vital Signs: Temp: 98 F (36.7 C) (08/17 1721) Temp Source: Oral (08/17 1721) BP: 113/72 (08/17 1800) Pulse Rate: 65 (08/17 1800)  Labs: Recent Labs    02/14/22 1256 02/14/22 1509 02/14/22 1814 02/14/22 2130  HGB 14.7  --   --   --   HCT 43.8  --   --   --   PLT 371  --   --   --   APTT  --  25  --   --   LABPROT  --  13.6  --   --   INR  --  1.1  --   --   HEPARINUNFRC  --   --   --  0.12*  CREATININE 0.68  --   --   --   TROPONINIHS 389* 442* 558*  --      Estimated Creatinine Clearance: 121.2 mL/min (by C-G formula based on SCr of 0.68 mg/dL).   Medical History: Past Medical History:  Diagnosis Date   CAD (coronary artery disease)    CHF (congestive heart failure) (HCC)    GERD (gastroesophageal reflux disease)    Hypertension    MI, old    Sleep apnea   Heparin Dosing Weight: 93.2 kg  Medications:  No history of chronic anticoagulant use PTA  Assessment: Pharmacy has been consulted to initiate and monitor heparin in 56yo male with history of CHF as well as CAD(not on [facility] PTA) presenting to the ED for evaluation of midsternal non-radiating chest pain and pressure over the past 2 days. Patient with troponin levels 389>442.   Baseline labs: aPTT 25 sec, INR 1.1, Hgb 14.7, Plts 371.  Goal of Therapy:  Heparin level 0.3-0.7 units/ml Monitor platelets by anticoagulation protocol: Yes   Plan:  HL 0.12. [facility]. Bolus and increase. Give 2800 units bolus x1, increase heparin infusion rate to 1600 units/hr Check anti-Xa level in 6 hours and daily while on heparin Continue to monitor H&H and platelets  Bobbe Medico, PharmD,  [facility] Clinical Pharmacist 02/14/2022 10:07 PM

## 2022-02-14 NOTE — Consult Note (Signed)
[facility] CLINIC CARDIOLOGY CONSULT NOTE       Patient ID: Brian Porter MRN: [id number] DOB/AGE: 1965/12/14 56 y.o.  Admit date: 02/14/2022 Referring Physician Dr. Willy Eddy Primary Physician [facility] [place]  Primary Cardiologist Dr. Welton Flakes  Reason for Consultation unstable angina  HPI: Brian Porter is a 56yoM with a PMH of CAD s/p PCI x1 to LAD ?2014, most recent [facility] 09/2015 with 70% first diagonal, RPDA 60%, proximal to mid LCx 60% and patent LAD stent, medically managed, HFrEF (LVEF 30-35%, G1 DD 2020), history of tobacco use, OSA not on CPAP, prediabetes who presented to [facility] ED 02/14/2022 with chest pain.  Cardiology is consulted for further assistance.  He presents with his girlfriend who contributes to the history.  He says earlier today he was riding his lawnmower when he started having midsternal chest discomfort he describes as burning.  He thought it was reflux initially, so he took 2 Pepcid and 2 baby aspirin with some improvement, although the pain reoccurred 15 to 20 minutes later.  The chest discomfort did not radiate, and was associated with some dizziness and diaphoresis.  He has had chest pain in the past, he notes it has been years ago, and says this pain he experiences today is mild in comparison to the past.  He has some exertional shortness of breath, and chronic peripheral edema he is unsure if it is worse than usual.  His girlfriend says the patient snores at night, notes episodes of apnea and gasping for air.  He does not wear a CPAP.  He is most bothered by pain and numbness in both of his legs that is also chronic in nature in addition to positional dizziness. At my time of evaluation the patient still has some mild chest pain, is not short of breath, and is calm and comfortable, hemodynamically stable.  Vitals are notable for a blood pressure of 115/74, heart rate 64 in sinus rhythm on telemetry SPO2 97% on room air.  He has been followed by the heart  failure clinic, but has not been seen by a cardiologist in some time.  He has been compliant with his medications.  He does not drink alcohol.  Former significant tobacco use with an 80-pack-year history, quitting a year and a half ago.  He used to use opioids recreationally, has not done this in 5 years.  Denies use of cocaine or IV drugs.  Labs are notable for potassium of 4.0, BUN/creatinine 19/0.68 GFR greater than 60.  High-sensitivity troponin elevated on first check at 389 with repeat 2 hours later uptrending at 442.  H&H stable at 14/43 with platelets 371.  BNP pending.  Chest x-ray with emphysematous changes, left linear basal opacities that are unchanged, without acute cardiopulmonary abnormality.  Review of systems complete and found to be negative unless listed above     Past Medical History:  Diagnosis Date   CAD (coronary artery disease)    CHF (congestive heart failure) (HCC)    GERD (gastroesophageal reflux disease)    Hypertension    MI, old    Sleep apnea     Past Surgical History:  Procedure Laterality Date   CARDIAC CATHETERIZATION Right 10/16/2015   Procedure: Left Heart Cath and Coronary Angiography;  Surgeon: Laurier Nancy, MD;  Location: [facility] INVASIVE CV LAB;  Service: Cardiovascular;  Laterality: Right;   CORONARY ANGIOPLASTY WITH STENT PLACEMENT  approx 2 years ago   HERNIA REPAIR Left 11/29/2016   [facility]  PARTIAL NEPHRECTOMY Left    SPLENECTOMY      (Not in a hospital admission)  Social History   Socioeconomic History   Marital status: Single    Spouse name: Not on file   Number of children: Not on file   Years of education: Not on file   Highest education level: Not on file  Occupational History   Occupation: unemployed  Tobacco Use   Smoking status: Former    Packs/day: 0.50    Types: Cigarettes    Quit date: 10/24/2020    Years since quitting: 1.3   Smokeless tobacco: Never  Vaping Use   Vaping Use: Never used  Substance and Sexual Activity    Alcohol use: No    Alcohol/week: 0.0 standard drinks of alcohol   Drug use: Not Currently    Comment: percocet   Sexual activity: Not Currently  Other Topics Concern   Not on file  Social History Narrative   Not on file   Social Determinants of Health   Financial Resource Strain: Low Risk  (05/05/2019)   Overall Financial Resource Strain (CARDIA)    Difficulty of Paying Living Expenses: Not hard at all  Food Insecurity: No Food Insecurity (11/16/2020)   Hunger Vital Sign    Worried About Running Out of Food in the Last Year: Never true    Ran Out of Food in the Last Year: Never true  Transportation Needs: No Transportation Needs (11/16/2020)   PRAPARE - Administrator, Civil Service (Medical): No    Lack of Transportation (Non-Medical): No  Physical Activity: Sufficiently Active (05/05/2019)   Exercise Vital Sign    Days of Exercise per Week: 7 days    Minutes of Exercise per Session: 30 min  Stress: No Stress Concern Present (05/05/2019)   [employer] of Occupational Health - Occupational Stress Questionnaire    Feeling of Stress : Not at all  Social Connections: Moderately Isolated (05/05/2019)   Social Connection and Isolation Panel [NHANES]    Frequency of Communication with Friends and Family: More than three times a week    Frequency of Social Gatherings with Friends and Family: More than three times a week    Attends Religious Services: 1 to 4 times per year    Active Member of [employer] or Organizations: No    Attends Banker Meetings: Never    Marital Status: Divorced  Catering manager Violence: Not At Risk (05/05/2019)   Humiliation, Afraid, Rape, and Kick questionnaire    Fear of Current or Ex-Partner: No    Emotionally Abused: No    Physically Abused: No    Sexually Abused: No    Family History  Problem Relation Age of Onset   Hypertension Mother    Other Mother        covid pneumonia   Congestive Heart Failure Mother    Heart  attack Father 57   Diabetes Father    Hypertension Father    Diabetes Mellitus II Brother       PHYSICAL EXAM General: Middle-aged Caucasian male, well nourished, in no acute distress.  Sitting upright in ED stretcher with girlfriend at bedside. HEENT:  Normocephalic and atraumatic. Neck:  No JVD.  Lungs: Normal respiratory effort on room air.  Decreased breath sounds with trace crackles left greater than right  heart: HRRR . Normal S1 and S2 without gallops or murmurs.  Abdomen: Obese appearing.  Msk: Normal strength and tone for age. Extremities: Warm and well perfused.  No clubbing, cyanosis.  1+ bilateral lower extremity edema edema.  Neuro: Alert and oriented X 3. Psych:  Answers questions appropriately.   Labs:   Lab Results  Component Value Date   WBC 10.8 (H) 02/14/2022   HGB 14.7 02/14/2022   HCT 43.8 02/14/2022   MCV 92.6 02/14/2022   PLT 371 02/14/2022    Recent Labs  Lab 02/14/22 1256  NA 139  K 4.0  CL 108  CO2 24  BUN 19  CREATININE 0.68  CALCIUM 8.9  GLUCOSE 94   Lab Results  Component Value Date   CKTOTAL 68 08/31/2020   CKMB 1.4 03/23/2012   TROPONINI <0.03 07/24/2018    Lab Results  Component Value Date   CHOL 201 (H) 12/05/2021   CHOL 114 11/15/2020   CHOL 102 10/22/2020   Lab Results  Component Value Date   HDL 54 12/05/2021   HDL 33 (L) 11/15/2020   HDL 30 (L) 10/22/2020   Lab Results  Component Value Date   LDLCALC 128 (H) 12/05/2021   LDLCALC 66 11/15/2020   LDLCALC 63 10/22/2020   Lab Results  Component Value Date   TRIG 107 12/05/2021   TRIG 71 11/15/2020   TRIG 46 10/22/2020   Lab Results  Component Value Date   CHOLHDL 3.7 12/05/2021   CHOLHDL 3.5 11/15/2020   CHOLHDL 3.4 10/22/2020   No results found for: "LDLDIRECT"    Radiology: DG Chest 2 View  Result Date: 02/14/2022 CLINICAL DATA:  Chest pain EXAM: CHEST - 2 VIEW COMPARISON:  October 04, 2021 FINDINGS: The cardiomediastinal silhouette is unchanged in  contour. No acute pleuroparenchymal abnormality. Redemonstrated biapical emphysematous changes. Left basilar linear opacities, unchanged, likely representing atelectasis/scarring. No pleural effusion. No pneumothorax. Visualized abdomen is unremarkable. Multilevel degenerative changes of the thoracic spine. IMPRESSION: No acute cardiopulmonary abnormality. Electronically Signed   By: Jacob Moores M.D.   On: 02/14/2022 13:19    10/16/2015 -left heart cath Dr. Park Breed  1st Diag lesion, 70% stenosed. The lesion was not previously treated. RPDA lesion, 60% stenosed. The lesion was not previously treated. Prox Cx to Mid Cx lesion, 60% stenosed. The lesion was not previously treated. Prox LAD to Mid LAD lesion, 30% stenosed. The lesion was previously treated with a stent (unknown type).   LVEF is 15 %, and stent in LAD patent. Advise treatment for cardiomyopathy. Diagnal too small for PCI.  ECHO 05/24/2019 IMPRESSIONS   1. Left ventricular ejection fraction, by visual estimation, is 30 to  35%. The left ventricle has moderate to severely decreased function. Left  ventricular septal wall thickness was normal. Normal left ventricular  posterior wall thickness. There is no  left ventricular hypertrophy.   2. Left ventricular diastolic parameters are consistent with Grade I  diastolic dysfunction (impaired relaxation).   3. Global right ventricle has normal systolic function.The right  ventricular size is normal. No increase in right ventricular wall  thickness.   4. Left atrial size was normal.   5. Right atrial size was normal.   6. The mitral valve was not well visualized. Trace mitral valve  regurgitation.   7. The tricuspid valve is not well visualized. Tricuspid valve  regurgitation is trivial.   8. The aortic valve is grossly normal. Aortic valve regurgitation is  trivial.   9. The pulmonic valve was not well visualized. Pulmonic valve  regurgitation is trivial.  10. The aortic root was  not well visualized.  11. The atrial septum is grossly normal.  FINDINGS   Left Ventricle: Left ventricular ejection fraction, by visual estimation,  is 30 to 35%. The left ventricle has moderate to severely decreased  function. Normal left ventricular posterior wall thickness. There is no  left ventricular hypertrophy. Left  ventricular diastolic parameters are consistent with Grade I diastolic  dysfunction (impaired relaxation).   Right Ventricle: The right ventricular size is normal. No increase in  right ventricular wall thickness. Global RV systolic function is has  normal systolic function.   Left Atrium: Left atrial size was normal in size.   Right Atrium: Right atrial size was normal in size   Pericardium: There is no evidence of pericardial effusion.   Mitral Valve: The mitral valve was not well visualized. Trace mitral valve  regurgitation.   Tricuspid Valve: The tricuspid valve is not well visualized. Tricuspid  valve regurgitation is trivial.   Aortic Valve: The aortic valve is grossly normal. Aortic valve  regurgitation is trivial. Aortic valve mean gradient measures 7.7 mmHg.  Aortic valve peak gradient measures 12.4 mmHg. Aortic valve area, by VTI  measures 1.74 cm.   Pulmonic Valve: The pulmonic valve was not well visualized. Pulmonic valve  regurgitation is trivial.   Aorta: The aortic root was not well visualized.   IAS/Shunts: The atrial septum is grossly normal.       LEFT VENTRICLE  PLAX 2D  LVIDd:         6.71 cm       Diastology  LVIDs:         5.59 cm       LV e' lateral:   4.13 cm/s  LV PW:         1.76 cm       LV E/e' lateral: 16.0  LV IVS:        1.02 cm       LV e' medial:    5.77 cm/s  LVOT diam:     2.20 cm       LV E/e' medial:  11.4  LV SV:         79 ml  LV SV Index:   35.39  LVOT Area:     3.80 cm     LV Volumes (MOD)  LV area d, A2C:    41.50 cm  LV area d, A4C:    40.60 cm  LV area s, A2C:    36.30 cm  LV area s, A4C:     33.20 cm  LV major d, A2C:   8.03 cm  LV major d, A4C:   8.28 cm  LV major s, A2C:   7.96 cm  LV major s, A4C:   8.54 cm  LV vol d, MOD A2C: 174.0 ml  LV vol d, MOD A4C: 162.0 ml  LV vol s, MOD A2C: 133.0 ml  LV vol s, MOD A4C: 107.0 ml  LV SV MOD A2C:     41.0 ml  LV SV MOD A4C:     162.0 ml  LV SV MOD BP:      47.2 ml   RIGHT VENTRICLE  RV Basal diam:  3.99 cm  TAPSE (M-mode): 4.6 cm   LEFT ATRIUM             Index       RIGHT ATRIUM           Index  LA diam:        4.80 cm 2.23 cm/m  RA Area:  20.90 cm  LA Vol (A2C):   83.5 ml 38.83 ml/m RA Volume:   58.10 ml  27.02 ml/m  LA Vol (A4C):   92.3 ml 42.92 ml/m  LA Biplane Vol: 87.0 ml 40.45 ml/m   AORTIC VALVE                    PULMONIC VALVE  AV Area (Vmax):    1.45 cm     PV Vmax:        0.89 m/s  AV Area (Vmean):   1.43 cm     PV Peak grad:   3.2 mmHg  AV Area (VTI):     1.74 cm     RVOT Peak grad: 3 mmHg  AV Vmax:           176.33 cm/s  AV Vmean:          128.000 cm/s  AV VTI:            0.348 m  AV Peak Grad:      12.4 mmHg  AV Mean Grad:      7.7 mmHg  LVOT Vmax:         67.20 cm/s  LVOT Vmean:        48.000 cm/s  LVOT VTI:          0.159 m  LVOT/AV VTI ratio: 0.46     AORTA  Ao Root diam: 3.20 cm   MITRAL VALVE  MV Area (PHT): 2.77 cm              SHUNTS  MV PHT:        79.46 msec            Systemic VTI:  0.16 m  MV Decel Time: 274 msec              Systemic Diam: 2.20 cm  MV E velocity: 66.00 cm/s  103 cm/s  MV A velocity: 105.00 cm/s 70.3 cm/s  MV E/A ratio:  0.63        1.5      Harold Hedge MD  Electronically signed by Harold Hedge MD  Signature Date/Time: 05/24/2019/1:08:12 PM   TELEMETRY reviewed by me: Sinus rhythm rate 60s to 70s  EKG reviewed by me: Sinus rhythm 68, poor R wave progression with nonspecific ST changes, overall similar to prior from April 2023  ASSESSMENT AND PLAN:  Mackenzy Eisenberg is a 56yoM with a PMH of CAD s/p PCI x1 to LAD ?2014, most recent [facility] 09/2015 with 70%  first diagonal, RPDA 60%, proximal to mid LCx 60% and patent LAD stent, medically managed, HFrEF (LVEF 30-35%, G1 DD 2020), history of tobacco use, OSA not on CPAP, prediabetes who presented to [facility] ED 02/14/2022 with chest pain.  Cardiology is consulted for further assistance.  #Unstable angina #CAD s/p PCI x1 to the LAD (?2014) with moderate diffuse disease The patient presents with substernal chest discomfort he described as a burning, somewhat relieved with Pepcid and aspirin, but reoccurring and lasting for 15 to 20 minutes at a time with some exertional component.  Overall worsening over the past couple days. He is a difficult historian, but overall notes this chest discomfort is mild in comparison to what he is experienced in the past.  Troponin is elevated on first check at 389, first repeat slightly uptrending at 442.  We will continue to trend until peak and also check a BNP.  EKG looks overall similar to prior from April  2023, not acutely ischemic. -S/p 325 mg aspirin, continue 81 mg aspirin daily -Continue heparin drip for now. -Continue home metoprolol XL 25 mg once daily, hold losartan for now with relatively low blood pressures. -Agree with Nitropaste, will add -Continue atorvastatin 80 mg, check lipid panel in the morning -Echocardiogram complete -Will consider ischemic evaluation during this admission, will make n.p.o. at midnight and reassess in the morning.  #HFrEF (LVEF 30% in 2020) The patient has 1+ edema in his lower extremities, he is unsure if it is any worse than what it is usually.  Not significantly volume overloaded appearing other than peripheral edema.  Has chronic mild exertional dyspnea, chronic two-pillow orthopnea -We will check a BNP -Give IV Lasix 40 mg x 1, monitor response. -Continue GDMT with metoprolol XL 25 mg, spironolactone 25 mg once daily -Hold losartan for now with relative hypotension and with diuresis.  He takes 60 mg of p.o. Lasix once daily at  home -Repeat echo as above  This patient's plan of care was discussed and created with Dr. Darrold JunkerParaschos and he is in agreement.  Signed: Rebeca AllegraLily Michelle Xitlally Mooneyham , PA-C 02/14/2022, 4:29 PM [facility]

## 2022-02-14 NOTE — H&P (Signed)
[facility]   PATIENT NAME: Brian Porter    MR#:  [id number]  DATE OF BIRTH:  12/01/1965  DATE OF ADMISSION:  02/14/2022  PRIMARY CARE PHYSICIAN: Pcp, No   Patient is coming from: Home  REQUESTING/REFERRING PHYSICIAN: Willy Eddy, MD  CHIEF COMPLAINT:   Chief Complaint  Patient presents with   Chest Pain    HISTORY OF PRESENT ILLNESS:  Brian Porter is a 56 y.o. Caucasian male with medical history significant for coronary artery disease, CHF, GERD, hypertension and obstructive sleep apnea, presented to the emergency room with acute onset of midsternal chest pain felt as burning and indigestion, which has been intermittent over the last couple of days with no radiation.  He has chronic right neck pain however.  It has been worsening with exertion and it occurred while riding his lawnmower today.  This lasted about 15 to 20 minutes with associated diaphoresis and presyncope.  With ambulation has been having more frequent episodes over the last 48 hours as well.  No nausea or vomiting.  No cough or wheezing or hemoptysis.  No leg pain or worsening edema or recent travels or surgeries.  ED Course: When he came to the ER, vital signs were within normal.  BMP was within normal.  High-sensitivity troponin I was 389 and later 442.  CBC showed WBC of 10.8.  Coag profile was within normal. EKG as reviewed by me : EKG showed normal sinus rhythm rate of 75 with left axis deviation and minimal voltage criteria for LVH.  It showed Q waves anteroseptally and T wave inversion laterally. Imaging: Two-view chest x-ray showed no acute cardiopulmonary disease.  The patient was given 40 aspirin, 650 mg p.o. Tylenol, nitro Nitropaste and was started on IV heparin with bolus and drip.  He will be admitted to a progressive unit bed for further evaluation and management. PAST MEDICAL HISTORY:   Past Medical History:  Diagnosis Date   CAD (coronary artery disease)    CHF (congestive heart  failure) (HCC)    GERD (gastroesophageal reflux disease)    Hypertension    MI, old    Sleep apnea     PAST SURGICAL HISTORY:   Past Surgical History:  Procedure Laterality Date   CARDIAC CATHETERIZATION Right 10/16/2015   Procedure: Left Heart Cath and Coronary Angiography;  Surgeon: Laurier Nancy, MD;  Location: [facility] INVASIVE CV LAB;  Service: Cardiovascular;  Laterality: Right;   CORONARY ANGIOPLASTY WITH STENT PLACEMENT  approx 2 years ago   HERNIA REPAIR Left 11/29/2016   [facility]   PARTIAL NEPHRECTOMY Left    SPLENECTOMY      SOCIAL HISTORY:   Social History   Tobacco Use   Smoking status: Former    Packs/day: 0.50    Types: Cigarettes    Quit date: 10/24/2020    Years since quitting: 1.3   Smokeless tobacco: Never  Substance Use Topics   Alcohol use: No    Alcohol/week: 0.0 standard drinks of alcohol    FAMILY HISTORY:   Family History  Problem Relation Age of Onset   Hypertension Mother    Other Mother        covid pneumonia   Congestive Heart Failure Mother    Heart attack Father 89   Diabetes Father    Hypertension Father    Diabetes Mellitus II Brother     DRUG ALLERGIES:   Allergies  Allergen Reactions   Jardiance [Empagliflozin] Diarrhea   Entresto [Sacubitril-Valsartan] Cough  REVIEW OF SYSTEMS:   ROS As per history of present illness. All pertinent systems were reviewed above. Constitutional, HEENT, cardiovascular, respiratory, GI, GU, musculoskeletal, neuro, psychiatric, endocrine, integumentary and hematologic systems were reviewed and are otherwise negative/unremarkable except for positive findings mentioned above in the HPI.   MEDICATIONS AT HOME:   Prior to Admission medications   Medication Sig Start Date End Date Taking? Authorizing Provider  acetaminophen (TYLENOL) 650 MG CR tablet Take 650 mg by mouth every 8 (eight) hours as needed for pain.    [provider]  aspirin 81 MG EC tablet Take 1 tablet (81 mg total) by  mouth daily. 10/23/20   Elgergawy, Leana Roe, MD  atorvastatin (LIPITOR) 20 MG tablet Take 1 tablet (20 mg total) by mouth daily. 01/22/22   Delma Freeze, FNP  furosemide (LASIX) 40 MG tablet Take 1 tablet (40 mg total) by mouth daily. 08/08/21   Delma Freeze, FNP  gabapentin (NEURONTIN) 100 MG capsule Take 1 capsule (100 mg total) by mouth 3 (three) times daily. Patient taking differently: Take 100 mg by mouth 3 (three) times daily. As needed 09/26/21   Iloabachie, Chioma E, NP  losartan (COZAAR) 25 MG tablet Take 1 tablet (25 mg total) by mouth daily. 09/26/21   Iloabachie, Chioma E, NP  metoprolol succinate (TOPROL-XL) 25 MG 24 hr tablet Take 1 tablet by mouth once daily 12/03/21   Clarisa Kindred A, FNP  potassium chloride (KLOR-CON M) 10 MEQ tablet Take 1/2 (one-half) tablet by mouth once daily 11/03/21   Delma Freeze, FNP  spironolactone (ALDACTONE) 25 MG tablet Take 1 tablet (25 mg total) by mouth daily. 08/08/21   Delma Freeze, FNP      VITAL SIGNS:  Blood pressure 132/85, pulse 75, temperature 98 F (36.7 C), temperature source Oral, resp. rate 18, height 5\' 9"  (1.753 m), weight 104.3 kg, SpO2 95 %.  PHYSICAL EXAMINATION:  Physical Exam  GENERAL:  56 y.o.-year-old  Male patient lying in the bed with no acute distress.  EYES: Pupils equal, round, reactive to light and accommodation. No scleral icterus. Extraocular muscles intact.  HEENT: Head atraumatic, normocephalic. Oropharynx and nasopharynx clear.  NECK:  Supple, no jugular venous distention. No thyroid enlargement, no tenderness.  LUNGS: Normal breath sounds bilaterally, no wheezing, rales,rhonchi or crepitation. No use of accessory muscles of respiration.  CARDIOVASCULAR: Regular rate and rhythm, S1, S2 normal. No murmurs, rubs, or gallops.  ABDOMEN: Soft, nondistended, nontender. Bowel sounds present. No organomegaly or mass.  EXTREMITIES: No pedal edema, cyanosis, or clubbing.  NEUROLOGIC: Cranial nerves II through XII are  intact. Muscle strength 5/5 in all extremities. Sensation intact. Gait not checked.  PSYCHIATRIC: The patient is alert and oriented x 3.  Normal affect and good eye contact. SKIN: No obvious rash, lesion, or ulcer.   LABORATORY PANEL:   CBC Recent Labs  Lab 02/14/22 1256  WBC 10.8*  HGB 14.7  HCT 43.8  PLT 371   ------------------------------------------------------------------------------------------------------------------  Chemistries  Recent Labs  Lab 02/14/22 1256  NA 139  K 4.0  CL 108  CO2 24  GLUCOSE 94  BUN 19  CREATININE 0.68  CALCIUM 8.9   ------------------------------------------------------------------------------------------------------------------  Cardiac Enzymes No results for input(s): "TROPONINI" in the last 168 hours. ------------------------------------------------------------------------------------------------------------------  RADIOLOGY:  DG Chest 2 View  Result Date: 02/14/2022 CLINICAL DATA:  Chest pain EXAM: CHEST - 2 VIEW COMPARISON:  October 04, 2021 FINDINGS: The cardiomediastinal silhouette is unchanged in contour. No acute pleuroparenchymal abnormality. Redemonstrated  biapical emphysematous changes. Left basilar linear opacities, unchanged, likely representing atelectasis/scarring. No pleural effusion. No pneumothorax. Visualized abdomen is unremarkable. Multilevel degenerative changes of the thoracic spine. IMPRESSION: No acute cardiopulmonary abnormality. Electronically Signed   By: Jacob Moores M.D.   On: 02/14/2022 13:19      IMPRESSION AND PLAN:  Assessment and Plan: * NSTEMI (non-ST elevated myocardial infarction) [facility])  - The patient will be admitted to a PCU bed. - We will continue him on IV heparin. - We will place on high-dose statin. - As needed sublingual nitroglycerin and IV morphine sulfate will be provided. -He will be on aspirin and will continue beta-blocker therapy as well as ARB therapy. -Cardiology consult and  2D echo will be obtained. - I notified Dr. Darrold Junker about the patient  Chronic systolic CHF (congestive heart failure) (HCC) -We will continue Aldactone, Toprol-XL and ARB therapy.    Essential hypertension - We will continue his antihypertensives.  Dyslipidemia - We will continue statin therapy but at a higher dose.   DVT prophylaxis: IV heparin drip Advanced Care Planning:  Code Status: full code. Family Communication:  The plan of care was discussed in details with the patient (and family). I answered all questions. The patient agreed to proceed with the above mentioned plan. Further management will depend upon hospital course. Disposition Plan: Back to previous home environment Consults called: Cardiology All the records are reviewed and case discussed with ED provider.  Status is: Inpatient    At the time of the admission, it appears that the appropriate admission status for this patient is inpatient.  This is judged to be reasonable and necessary in order to provide the required intensity of service to ensure the patient's safety given the presenting symptoms, physical exam findings and initial radiographic and laboratory data in the context of comorbid conditions.  The patient requires inpatient status due to high intensity of service, high risk of further deterioration and high frequency of surveillance required.  I certify that at the time of admission, it is my clinical judgment that the patient will require inpatient hospital care extending more than 2 midnights.                            Dispo: The patient is from: Home              Anticipated d/c is to: Home              Patient currently is not medically stable to d/c.              Difficult to place patient: No  Hannah Beat M.D on 02/14/2022 at 5:08 PM  [facility]   From 7 PM-7 AM, contact night-coverage [email or web address]  CC: Primary care physician; Pcp, No

## 2022-02-14 NOTE — ED Notes (Signed)
Pt sitting up on side of bed watching tv. Breathing noted to be unlabored and pt speaking in full sentences. Symmetric chest rise and fall noted. Pt is alert and oriented following commands appropriately.   Per report pt ambulatory in room from side of bed to toilet (located in room at bedside). Pt has been utilizing bathroom as needed independently and without incident. Pt confirms that he is not feeling dizzy or weak and that he is ambulatory without assistive device at home. Pt is noted to be wearing shoes with rubber soles. Pt educated about risk of bleeding d/t heparin infusion should he fall. RN informed pt to please alert her should pt start feeling weak and dizzy so that RN or other staff could assist pt to toilet to ensure pt safety. Pt verbalizes understanding of this and states he will alert RN should dizziness or weakness return.   Patient's bed is low and locked with side rails raised x1. Call bell is in reach and pt demonstrates correct understanding of use. Personal items are within reach of pt on bedside table and path between bed and toilet are free from clutter which would pose trip hazard to patient.

## 2022-02-14 NOTE — Assessment & Plan Note (Signed)
-   We will continue statin therapy but at a higher dose.

## 2022-02-15 ENCOUNTER — Inpatient Hospital Stay
Admit: 2022-02-15 | Discharge: 2022-02-15 | Disposition: A | Discharge status: Home / Self Care | Payer: Self-pay | Attending: Cardiology | Admitting: Cardiology

## 2022-02-15 ENCOUNTER — Encounter
Admission: EM | Disposition: A | Discharge status: Home / Self Care | Payer: Self-pay | Source: Home / Self Care | Attending: Family Medicine

## 2022-02-15 HISTORY — PX: LEFT HEART CATH AND CORONARY ANGIOGRAPHY: CATH118249

## 2022-02-15 LAB — BASIC METABOLIC PANEL
Anion gap: 5 (ref 5–15)
BUN: 19 mg/dL (ref 6–20)
CO2: 26 mmol/L (ref 22–32)
Calcium: 8.5 mg/dL — ABNORMAL LOW (ref 8.9–10.3)
Chloride: 107 mmol/L (ref 98–111)
Creatinine, Ser: 0.81 mg/dL (ref 0.61–1.24)
GFR, Estimated: >60 mL/min (ref >60)
Glucose, Bld: 102 mg/dL — ABNORMAL HIGH (ref 70–99)
Potassium: 4.4 mmol/L (ref 3.5–5.1)
Sodium: 138 mmol/L (ref 135–145)

## 2022-02-15 LAB — ECHOCARDIOGRAM COMPLETE
AR max vel: 1.83 cm2
AV Area VTI: 2.04 cm2
AV Area mean vel: 2.05 cm2
AV Mean grad: 5.3 mmHg
AV Peak grad: 10.8 mmHg
Ao pk vel: 1.64 m/s
Area-P 1/2: 3 cm2
Calc EF: 36.4 %
Height: 69 in
S' Lateral: 5.5 cm
Single Plane A2C EF: 39.6 %
Single Plane A4C EF: 32.4 %
Weight: 3680 oz

## 2022-02-15 LAB — LIPID PANEL
Cholesterol: 105 mg/dL (ref 0–200)
HDL: 31 mg/dL — ABNORMAL LOW (ref >40)
LDL Cholesterol: 58 mg/dL (ref 0–99)
Total CHOL/HDL Ratio: 3.4 RATIO
Triglycerides: 78 mg/dL (ref <150)
VLDL: 16 mg/dL (ref 0–40)

## 2022-02-15 LAB — TROPONIN I (HIGH SENSITIVITY): Troponin I (High Sensitivity): 462 ng/L (ref <18)

## 2022-02-15 LAB — HEPARIN LEVEL (UNFRACTIONATED): Heparin Unfractionated: 0.59 IU/mL (ref 0.30–0.70)

## 2022-02-15 SURGERY — LEFT HEART CATH AND CORONARY ANGIOGRAPHY
Anesthesia: Moderate Sedation

## 2022-02-15 MED ORDER — SODIUM CHLORIDE 0.9% FLUSH: 3.0000 mL | Freq: Two times a day (BID) | INTRAVENOUS | Status: DC

## 2022-02-15 MED ORDER — SODIUM CHLORIDE 0.9% FLUSH
3.0000 mL | Freq: Two times a day (BID) | INTRAVENOUS | Status: DC
  Administered 2022-02-15: 3 mL via INTRAVENOUS

## 2022-02-15 MED ORDER — HEPARIN (PORCINE) IN NACL 1000-0.9 UT/500ML-% IV SOLN
INTRAVENOUS | Status: AC
  Filled 2022-02-15: qty 1000

## 2022-02-15 MED ORDER — ONDANSETRON HCL 4 MG/2ML IJ SOLN: 4.0000 mg | Freq: Four times a day (QID) | INTRAMUSCULAR | Status: DC | PRN

## 2022-02-15 MED ORDER — ISOSORBIDE MONONITRATE ER 30 MG PO TB24
30.0000 mg | ORAL_TABLET | Freq: Every day | ORAL | Status: DC
  Administered 2022-02-15: 30 mg via ORAL
  Filled 2022-02-15: qty 1

## 2022-02-15 MED ORDER — VERAPAMIL HCL 2.5 MG/ML IV SOLN
INTRAVENOUS | Status: AC
  Filled 2022-02-15: qty 2

## 2022-02-15 MED ORDER — IOHEXOL 300 MG/ML  SOLN
INTRAMUSCULAR | Status: DC | PRN
  Administered 2022-02-15: 95 mL

## 2022-02-15 MED ORDER — ASPIRIN 81 MG PO TBEC
81.0000 mg | DELAYED_RELEASE_TABLET | Freq: Every day | ORAL | Status: DC
  Administered 2022-02-15: 81 mg via ORAL
  Filled 2022-02-15: qty 1

## 2022-02-15 MED ORDER — SODIUM CHLORIDE 0.9% FLUSH: 3.0000 mL | INTRAVENOUS | Status: DC | PRN

## 2022-02-15 MED ORDER — SODIUM CHLORIDE 0.9 % IV SOLN
250.0000 mL | INTRAVENOUS | Status: DC | PRN
Start: 2022-02-15 — End: 2022-02-15

## 2022-02-15 MED ORDER — MIDAZOLAM HCL 2 MG/2ML IJ SOLN
INTRAMUSCULAR | Status: DC | PRN
  Administered 2022-02-15: 1 mg via INTRAVENOUS

## 2022-02-15 MED ORDER — FENTANYL CITRATE (PF) 100 MCG/2ML IJ SOLN
INTRAMUSCULAR | Status: AC
  Filled 2022-02-15: qty 2

## 2022-02-15 MED ORDER — FENTANYL CITRATE (PF) 100 MCG/2ML IJ SOLN
INTRAMUSCULAR | Status: DC | PRN
Start: 2022-02-15 — End: 2022-02-15
  Administered 2022-02-15 (×2): 50 ug via INTRAVENOUS

## 2022-02-15 MED ORDER — ATORVASTATIN CALCIUM 80 MG PO TABS: 80.0000 mg | ORAL_TABLET | Freq: Every day | ORAL | 1 refills | Status: DC

## 2022-02-15 MED ORDER — MIDAZOLAM HCL 2 MG/2ML IJ SOLN
INTRAMUSCULAR | Status: AC
  Filled 2022-02-15: qty 2

## 2022-02-15 MED ORDER — LIDOCAINE HCL (PF) 1 % IJ SOLN
INTRAMUSCULAR | Status: DC | PRN
  Administered 2022-02-15: 2 mL

## 2022-02-15 MED ORDER — ACETAMINOPHEN 325 MG PO TABS: 650.0000 mg | ORAL_TABLET | ORAL | Status: DC | PRN

## 2022-02-15 MED ORDER — LIDOCAINE HCL 1 % IJ SOLN
INTRAMUSCULAR | Status: AC
  Filled 2022-02-15: qty 20

## 2022-02-15 MED ORDER — SODIUM CHLORIDE 0.9 % WEIGHT BASED INFUSION
1.0000 mL/kg/h | INTRAVENOUS | Status: DC
  Administered 2022-02-15: 1 mL/kg/h via INTRAVENOUS

## 2022-02-15 MED ORDER — HEPARIN SODIUM (PORCINE) 1000 UNIT/ML IJ SOLN
INTRAMUSCULAR | Status: AC
  Filled 2022-02-15: qty 10

## 2022-02-15 MED ORDER — LABETALOL HCL 5 MG/ML IV SOLN: 10.0000 mg | INTRAVENOUS | Status: DC | PRN

## 2022-02-15 MED ORDER — ISOSORBIDE MONONITRATE ER 30 MG PO TB24: 30.0000 mg | ORAL_TABLET | Freq: Every day | ORAL | 1 refills | Status: DC

## 2022-02-15 MED ORDER — VERAPAMIL HCL 2.5 MG/ML IV SOLN
INTRAVENOUS | Status: DC | PRN
  Administered 2022-02-15 (×2): 2.5 mg via INTRA_ARTERIAL

## 2022-02-15 MED ORDER — HEPARIN (PORCINE) IN NACL 2000-0.9 UNIT/L-% IV SOLN
INTRAVENOUS | Status: DC | PRN
  Administered 2022-02-15: 1000 mL

## 2022-02-15 MED ORDER — PERFLUTREN LIPID MICROSPHERE
1.0000 mL | INTRAVENOUS | Status: AC | PRN
  Administered 2022-02-15: 2 mL via INTRAVENOUS

## 2022-02-15 MED ORDER — SODIUM CHLORIDE 0.9 % IV SOLN: INTRAVENOUS | Status: DC

## 2022-02-15 MED ORDER — HYDRALAZINE HCL 20 MG/ML IJ SOLN: 10.0000 mg | INTRAMUSCULAR | Status: DC | PRN

## 2022-02-15 MED ORDER — HEPARIN SODIUM (PORCINE) 1000 UNIT/ML IJ SOLN
INTRAMUSCULAR | Status: DC | PRN
  Administered 2022-02-15: 5000 [IU] via INTRAVENOUS

## 2022-02-15 MED ORDER — SODIUM CHLORIDE 0.9 % IV SOLN: 250.0000 mL | INTRAVENOUS | Status: DC | PRN

## 2022-02-15 SURGICAL SUPPLY — 11 items
BAND ZEPHYR COMPRESS 30 LONG (HEMOSTASIS) IMPLANT
CATH 5FR JL3.5 JR4 ANG PIG MP (CATHETERS) IMPLANT
DRAPE BRACHIAL (DRAPES) IMPLANT
GLIDESHEATH SLEND SS 6F .021 (SHEATH) IMPLANT
GUIDEWIRE INQWIRE 1.5J.035X260 (WIRE) IMPLANT
INQWIRE 1.5J .035X260CM (WIRE) ×1
KIT SYRINGE INJ CVI SPIKEX1 (MISCELLANEOUS) IMPLANT
PACK CARDIAC CATH (CUSTOM PROCEDURE TRAY) ×1 IMPLANT
PROTECTION STATION PRESSURIZED (MISCELLANEOUS) ×1
SET ATX SIMPLICITY (MISCELLANEOUS) IMPLANT
STATION PROTECTION PRESSURIZED (MISCELLANEOUS) IMPLANT

## 2022-02-15 NOTE — Progress Notes (Signed)
[facility] CLINIC CARDIOLOGY CONSULT NOTE       Patient ID: Brian Porter MRN: [id number] DOB/AGE: 57-14-1967 57 y.o.  Admit date: 02/14/2022 Referring Physician Dr. Willy Eddy Primary Physician [facility] [place]  Primary Cardiologist Dr. Welton Flakes  Reason for Consultation unstable angina  HPI: Brian Porter is a 56yoM with a PMH of CAD s/p PCI x1 to LAD ?2014, most recent [facility] 09/2015 with 70% first diagonal, RPDA 60%, proximal to mid LCx 60% and patent LAD stent, medically managed, HFrEF (LVEF 30-35%, G1 DD 2020), history of tobacco use, OSA not on CPAP, prediabetes who presented to [facility] ED 02/14/2022 with chest pain.  Cardiology is consulted for further assistance.  Interval History: - trop peaked at 558 yesterday  - chest pain free since yesterday evening - continues to have aching in both legs at rest, ?claudication  - echo performed this morning, read pending  Review of systems complete and found to be negative unless listed above     Past Medical History:  Diagnosis Date   CAD (coronary artery disease)    CHF (congestive heart failure) (HCC)    GERD (gastroesophageal reflux disease)    Hypertension    MI, old    Sleep apnea     Past Surgical History:  Procedure Laterality Date   CARDIAC CATHETERIZATION Right 10/16/2015   Procedure: Left Heart Cath and Coronary Angiography;  Surgeon: Laurier Nancy, MD;  Location: [facility] INVASIVE CV LAB;  Service: Cardiovascular;  Laterality: Right;   CORONARY ANGIOPLASTY WITH STENT PLACEMENT  approx 2 years ago   HERNIA REPAIR Left 11/29/2016   [facility]   PARTIAL NEPHRECTOMY Left    SPLENECTOMY      Medications Prior to Admission  Medication Sig Dispense Refill Last Dose   aspirin 81 MG EC tablet Take 1 tablet (81 mg total) by mouth daily. 30 tablet 2 02/14/2022 at 1200   atorvastatin (LIPITOR) 20 MG tablet Take 1 tablet (20 mg total) by mouth daily. 90 tablet 3 02/13/2022   furosemide (LASIX) 40 MG tablet Take 1 tablet (40 mg  total) by mouth daily. 90 tablet 3 02/13/2022   gabapentin (NEURONTIN) 100 MG capsule Take 1 capsule (100 mg total) by mouth 3 (three) times daily. (Patient taking differently: Take 100 mg by mouth 3 (three) times daily. As needed) 90 capsule 2 02/14/2022   metoprolol succinate (TOPROL-XL) 25 MG 24 hr tablet Take 1 tablet by mouth once daily 90 tablet 3 02/13/2022   potassium chloride (KLOR-CON M) 10 MEQ tablet Take 1/2 (one-half) tablet by mouth once daily (Patient taking differently: Take 10 mEq by mouth daily.) 45 tablet 5 02/13/2022   spironolactone (ALDACTONE) 25 MG tablet Take 1 tablet (25 mg total) by mouth daily. 90 tablet 3 02/13/2022   acetaminophen (TYLENOL) 650 MG CR tablet Take 650 mg by mouth every 8 (eight) hours as needed for pain.   prn at prn   losartan (COZAAR) 25 MG tablet Take 1 tablet (25 mg total) by mouth daily. (Patient not taking: Reported on 02/14/2022) 30 tablet 0 Not Taking    Social History   Socioeconomic History   Marital status: Single    Spouse name: Not on file   Number of children: Not on file   Years of education: Not on file   Highest education level: Not on file  Occupational History   Occupation: unemployed  Tobacco Use   Smoking status: Former    Packs/day: 0.50    Types: Cigarettes  Quit date: 10/24/2020    Years since quitting: 1.3   Smokeless tobacco: Never  Vaping Use   Vaping Use: Never used  Substance and Sexual Activity   Alcohol use: No    Alcohol/week: 0.0 standard drinks of alcohol   Drug use: Not Currently    Comment: percocet   Sexual activity: Not Currently  Other Topics Concern   Not on file  Social History Narrative   Not on file   Social Determinants of Health   Financial Resource Strain: Low Risk  (05/05/2019)   Overall Financial Resource Strain (CARDIA)    Difficulty of Paying Living Expenses: Not hard at all  Food Insecurity: No Food Insecurity (11/16/2020)   Hunger Vital Sign    Worried About Running Out of Food in  the Last Year: Never true    Ran Out of Food in the Last Year: Never true  Transportation Needs: No Transportation Needs (11/16/2020)   PRAPARE - Administrator, Civil Service (Medical): No    Lack of Transportation (Non-Medical): No  Physical Activity: Sufficiently Active (05/05/2019)   Exercise Vital Sign    Days of Exercise per Week: 7 days    Minutes of Exercise per Session: 30 min  Stress: No Stress Concern Present (05/05/2019)   [employer] of Occupational Health - Occupational Stress Questionnaire    Feeling of Stress : Not at all  Social Connections: Moderately Isolated (05/05/2019)   Social Connection and Isolation Panel [NHANES]    Frequency of Communication with Friends and Family: More than three times a week    Frequency of Social Gatherings with Friends and Family: More than three times a week    Attends Religious Services: 1 to 4 times per year    Active Member of [employer] or Organizations: No    Attends Banker Meetings: Never    Marital Status: Divorced  Catering manager Violence: Not At Risk (05/05/2019)   Humiliation, Afraid, Rape, and Kick questionnaire    Fear of Current or Ex-Partner: No    Emotionally Abused: No    Physically Abused: No    Sexually Abused: No    Family History  Problem Relation Age of Onset   Hypertension Mother    Other Mother        covid pneumonia   Congestive Heart Failure Mother    Heart attack Father 18   Diabetes Father    Hypertension Father    Diabetes Mellitus II Brother       PHYSICAL EXAM General: Middle-aged Caucasian male, well nourished, in no acute distress.  Laying on left side in PCU bed.  HEENT:  Normocephalic and atraumatic. Neck:  No JVD.  Lungs: Normal respiratory effort on room air.  Decreased breath sounds without crackles or wheezes. heart: HRRR . Normal S1 and S2 without gallops or murmurs.  Abdomen: Obese appearing.  Msk: Normal strength and tone for age. Extremities: Warm and  well perfused. No clubbing, cyanosis.  No peripheral edema, chronic venous stasis changes bilaterally  Neuro: Alert and oriented X 3. Psych:  Answers questions appropriately.   Labs:   Lab Results  Component Value Date   WBC 10.8 (H) 02/14/2022   HGB 14.7 02/14/2022   HCT 43.8 02/14/2022   MCV 92.6 02/14/2022   PLT 371 02/14/2022    Recent Labs  Lab 02/14/22 1256  NA 139  K 4.0  CL 108  CO2 24  BUN 19  CREATININE 0.68  CALCIUM 8.9  GLUCOSE 94  Lab Results  Component Value Date   CKTOTAL 68 08/31/2020   CKMB 1.4 03/23/2012   TROPONINI <0.03 07/24/2018    Lab Results  Component Value Date   CHOL 201 (H) 12/05/2021   CHOL 114 11/15/2020   CHOL 102 10/22/2020   Lab Results  Component Value Date   HDL 54 12/05/2021   HDL 33 (L) 11/15/2020   HDL 30 (L) 10/22/2020   Lab Results  Component Value Date   LDLCALC 128 (H) 12/05/2021   LDLCALC 66 11/15/2020   LDLCALC 63 10/22/2020   Lab Results  Component Value Date   TRIG 107 12/05/2021   TRIG 71 11/15/2020   TRIG 46 10/22/2020   Lab Results  Component Value Date   CHOLHDL 3.7 12/05/2021   CHOLHDL 3.5 11/15/2020   CHOLHDL 3.4 10/22/2020   No results found for: "LDLDIRECT"    Radiology: DG Chest 2 View  Result Date: 02/14/2022 CLINICAL DATA:  Chest pain EXAM: CHEST - 2 VIEW COMPARISON:  October 04, 2021 FINDINGS: The cardiomediastinal silhouette is unchanged in contour. No acute pleuroparenchymal abnormality. Redemonstrated biapical emphysematous changes. Left basilar linear opacities, unchanged, likely representing atelectasis/scarring. No pleural effusion. No pneumothorax. Visualized abdomen is unremarkable. Multilevel degenerative changes of the thoracic spine. IMPRESSION: No acute cardiopulmonary abnormality. Electronically Signed   By: Jacob Moores M.D.   On: 02/14/2022 13:19    10/16/2015 -left heart cath Dr. Park Breed  1st Diag lesion, 70% stenosed. The lesion was not previously treated. RPDA lesion, 60%  stenosed. The lesion was not previously treated. Prox Cx to Mid Cx lesion, 60% stenosed. The lesion was not previously treated. Prox LAD to Mid LAD lesion, 30% stenosed. The lesion was previously treated with a stent (unknown type).   LVEF is 15 %, and stent in LAD patent. Advise treatment for cardiomyopathy. Diagnal too small for PCI.  ECHO 05/24/2019 IMPRESSIONS   1. Left ventricular ejection fraction, by visual estimation, is 30 to  35%. The left ventricle has moderate to severely decreased function. Left  ventricular septal wall thickness was normal. Normal left ventricular  posterior wall thickness. There is no  left ventricular hypertrophy.   2. Left ventricular diastolic parameters are consistent with Grade I  diastolic dysfunction (impaired relaxation).   3. Global right ventricle has normal systolic function.The right  ventricular size is normal. No increase in right ventricular wall  thickness.   4. Left atrial size was normal.   5. Right atrial size was normal.   6. The mitral valve was not well visualized. Trace mitral valve  regurgitation.   7. The tricuspid valve is not well visualized. Tricuspid valve  regurgitation is trivial.   8. The aortic valve is grossly normal. Aortic valve regurgitation is  trivial.   9. The pulmonic valve was not well visualized. Pulmonic valve  regurgitation is trivial.  10. The aortic root was not well visualized.  11. The atrial septum is grossly normal.   FINDINGS   Left Ventricle: Left ventricular ejection fraction, by visual estimation,  is 30 to 35%. The left ventricle has moderate to severely decreased  function. Normal left ventricular posterior wall thickness. There is no  left ventricular hypertrophy. Left  ventricular diastolic parameters are consistent with Grade I diastolic  dysfunction (impaired relaxation).   Right Ventricle: The right ventricular size is normal. No increase in  right ventricular wall thickness. Global  RV systolic function is has  normal systolic function.   Left Atrium: Left atrial size was normal in  size.   Right Atrium: Right atrial size was normal in size   Pericardium: There is no evidence of pericardial effusion.   Mitral Valve: The mitral valve was not well visualized. Trace mitral valve  regurgitation.   Tricuspid Valve: The tricuspid valve is not well visualized. Tricuspid  valve regurgitation is trivial.   Aortic Valve: The aortic valve is grossly normal. Aortic valve  regurgitation is trivial. Aortic valve mean gradient measures 7.7 mmHg.  Aortic valve peak gradient measures 12.4 mmHg. Aortic valve area, by VTI  measures 1.74 cm.   Pulmonic Valve: The pulmonic valve was not well visualized. Pulmonic valve  regurgitation is trivial.   Aorta: The aortic root was not well visualized.   IAS/Shunts: The atrial septum is grossly normal.       LEFT VENTRICLE  PLAX 2D  LVIDd:         6.71 cm       Diastology  LVIDs:         5.59 cm       LV e' lateral:   4.13 cm/s  LV PW:         1.76 cm       LV E/e' lateral: 16.0  LV IVS:        1.02 cm       LV e' medial:    5.77 cm/s  LVOT diam:     2.20 cm       LV E/e' medial:  11.4  LV SV:         79 ml  LV SV Index:   35.39  LVOT Area:     3.80 cm     LV Volumes (MOD)  LV area d, A2C:    41.50 cm  LV area d, A4C:    40.60 cm  LV area s, A2C:    36.30 cm  LV area s, A4C:    33.20 cm  LV major d, A2C:   8.03 cm  LV major d, A4C:   8.28 cm  LV major s, A2C:   7.96 cm  LV major s, A4C:   8.54 cm  LV vol d, MOD A2C: 174.0 ml  LV vol d, MOD A4C: 162.0 ml  LV vol s, MOD A2C: 133.0 ml  LV vol s, MOD A4C: 107.0 ml  LV SV MOD A2C:     41.0 ml  LV SV MOD A4C:     162.0 ml  LV SV MOD BP:      47.2 ml   RIGHT VENTRICLE  RV Basal diam:  3.99 cm  TAPSE (M-mode): 4.6 cm   LEFT ATRIUM             Index       RIGHT ATRIUM           Index  LA diam:        4.80 cm 2.23 cm/m  RA Area:     20.90 cm  LA Vol (A2C):   83.5  ml 38.83 ml/m RA Volume:   58.10 ml  27.02 ml/m  LA Vol (A4C):   92.3 ml 42.92 ml/m  LA Biplane Vol: 87.0 ml 40.45 ml/m   AORTIC VALVE                    PULMONIC VALVE  AV Area (Vmax):    1.45 cm     PV Vmax:        0.89 m/s  AV Area (Vmean):  1.43 cm     PV Peak grad:   3.2 mmHg  AV Area (VTI):     1.74 cm     RVOT Peak grad: 3 mmHg  AV Vmax:           176.33 cm/s  AV Vmean:          128.000 cm/s  AV VTI:            0.348 m  AV Peak Grad:      12.4 mmHg  AV Mean Grad:      7.7 mmHg  LVOT Vmax:         67.20 cm/s  LVOT Vmean:        48.000 cm/s  LVOT VTI:          0.159 m  LVOT/AV VTI ratio: 0.46     AORTA  Ao Root diam: 3.20 cm   MITRAL VALVE  MV Area (PHT): 2.77 cm              SHUNTS  MV PHT:        79.46 msec            Systemic VTI:  0.16 m  MV Decel Time: 274 msec              Systemic Diam: 2.20 cm  MV E velocity: 66.00 cm/s  103 cm/s  MV A velocity: 105.00 cm/s 70.3 cm/s  MV E/A ratio:  0.63        1.5      Harold Hedge MD  Electronically signed by Harold Hedge MD  Signature Date/Time: 05/24/2019/1:08:12 PM   TELEMETRY reviewed by me: Sinus rhythm rate 60s to 70s  EKG reviewed by me: Sinus rhythm 68, poor R wave progression with nonspecific ST changes, overall similar to prior from April 2023  ASSESSMENT AND PLAN:  Orry Sigl is a 56yoM with a PMH of CAD s/p PCI x1 to LAD ?2014, most recent [facility] 09/2015 with 70% first diagonal, RPDA 60%, proximal to mid LCx 60% and patent LAD stent, medically managed, HFrEF (LVEF 30-35%, G1 DD 2020), history of tobacco use, OSA not on CPAP, prediabetes who presented to [facility] ED 02/14/2022 with chest pain.  Cardiology is consulted for further assistance.  #NSTEMI #CAD s/p PCI x1 to the LAD (?2014) with moderate diffuse disease The patient presents with substernal chest discomfort he described as a burning, somewhat relieved with Pepcid and aspirin, but reoccurring and lasting for 15 to 20 minutes at a time with some exertional  component.  Overall worsening over the past couple days. He is a difficult historian, but overall notes this chest discomfort is mild in comparison to what he is experienced in the past.  Troponin is elevated on first check at 389, first repeat slightly uptrending at 442.  We will continue to trend until peak and also check a BNP.  EKG looks overall similar to prior from April 2023, not acutely ischemic. -S/p 325 mg aspirin, continue 81 mg aspirin daily -Continue heparin drip for now. -Continue home metoprolol XL 25 mg once daily, hold losartan for now with relatively low blood pressures. -Agree with Nitropaste, will add Imdur today  -Continue atorvastatin 80 mg, check lipid panel in the morning -Echocardiogram complete -plan for LHC and coronary angiography today at 12pm with Dr. Darrold Junker. Risks, benefits, alternatives discussed in detail with patient and he is agreeable to proceed.   #HFrEF (LVEF 30% in 2020) The patient has 1+ edema in his  lower extremities, he is unsure if it is any worse than what it is usually.  Not significantly volume overloaded appearing other than peripheral edema.  Has chronic mild exertional dyspnea, chronic two-pillow orthopnea -BNP negative at 70.  -s/p  IV Lasix 40 mg x 1 -Continue GDMT with metoprolol XL 25 mg, spironolactone 25 mg once daily -Hold losartan for now with relative hypotension, restart at able   He takes 60 mg of p.o. Lasix once daily at home -Repeat echo as above  #leg pain / ?claudication Complains of bilateral leg / calf cramping prior to admission, worse with ambulation and associated with numbness in his feet. Never had ABIs or seen vascular surgery - recommend referral to vascular at discharge   This patient's plan of care was discussed and created with Dr. Darrold JunkerParaschos and he is in agreement.  Signed: Rebeca AllegraLily Michelle Hymen Arnett , PA-C 02/15/2022, 8:01 AM [facility]

## 2022-02-15 NOTE — Progress Notes (Signed)
Mobility Specialist - Progress Note    02/15/22 1000  Mobility  Activity Ambulated independently in hallway;Stood at bedside;Dangled on edge of bed  Level of Assistance Independent  Assistive Device None  Distance Ambulated (ft) 160 ft  Activity Response Tolerated well  $Mobility charge 1 Mobility   Pt supine in bed on RA upon arrival. Pt STS and ambulates 1 lap around NS indep with no LOB or complaints of pain. Pt returns to bed with needs in reach.   Terrilyn Saver  Mobility Specialist  02/15/22 10:02 AM

## 2022-02-15 NOTE — Progress Notes (Signed)
Pt eating sandwich tray, with water and coffee

## 2022-02-15 NOTE — Discharge Summary (Addendum)
Teena IraniDavid M [facility]W:[id number]:[id number] DOB: 09/23/1965 DOA: 02/14/2022  PCP: Pcp, No  Admit date: 02/14/2022 Discharge date: 02/15/2022  Time spent: 35 minutes  Recommendations for Outpatient Follow-up:  Pcp, cardiology, and heart failure clinic f/u     Discharge Diagnoses:  Principal Problem:   NSTEMI (non-ST elevated myocardial infarction) (HCC) Active Problems:   Tobacco abuse   CVA (cerebral vascular accident) (HCC)   PAD (peripheral artery disease) (HCC)   Dyslipidemia   Essential hypertension   Chronic systolic CHF (congestive heart failure) (HCC)   Discharge Condition: stable  Diet recommendation: heart healthy  Filed Weights   02/14/22 1254  Weight: 104.3 kg    History of present illness:  From admission h and p Brian BailiffDavid M Porter is a 56 y.o. Caucasian male with medical history significant for coronary artery disease, CHF, GERD, hypertension and obstructive sleep apnea, presented to the emergency room with acute onset of midsternal chest pain felt as burning and indigestion, which has been intermittent over the last couple of days with no radiation.  He has chronic right neck pain however.  It has been worsening with exertion and it occurred while riding his lawnmower today.  This lasted about 15 to 20 minutes with associated diaphoresis and presyncope.  With ambulation has been having more frequent episodes over the last 48 hours as well.  No nausea or vomiting.  No cough or wheezing or hemoptysis.  No leg pain or worsening edema or recent travels or surgeries.  Hospital Course:  Patient with known CAD (PCI x1 2014) presents with chest pain found to have NSTEMI. Patient was started on IV heparin, aspirin was given. Taken to cath lab on hospital day 1 where three-vessel CAD was encountered without obvious culprit vessel, no intervention performed. TTE with EF 35-40%, clinically compensated. Ambulated without chest pain or difficulty following catheterization and requested discharge that  same day. Cardiology advises adding imdur which we have done. Patient will follow-up with his PCP and in the heart failure clinic, and I strongly advised he also establish with a cardiologist as he has not seen one for a long time.   Procedures: See above   Consultations: cardiology  Discharge Exam: Vitals:   02/15/22 1514 02/15/22 1545  BP: 130/71 132/81  Pulse: 84 74  Resp: 16 14  Temp:  98.2 F (36.8 C)  SpO2: 94% 94%    General exam: Appears calm and comfortable  Respiratory system: Clear to auscultation. Respiratory effort normal. Cardiovascular system: S1 & S2 heard, RRR. No JVD, murmurs, rubs, gallops or clicks. No pedal edema. Gastrointestinal system: Abdomen is nondistended, soft and nontender. No organomegaly or masses felt. Normal bowel sounds heard. Central nervous system: Alert and oriented. No focal neurological deficits. Extremities: Symmetric 5 x 5 power. Skin: No rashes, lesions or ulcers Psychiatry: Judgement and insight appear normal. Mood & affect appropriate.   Discharge Instructions   Discharge Instructions     Amb Referral to Cardiac Rehabilitation   Complete by: As directed    Diagnosis: NSTEMI   After initial evaluation and assessments completed: Virtual Based Care may be provided alone or in conjunction with Phase 2 Cardiac Rehab based on patient barriers.: Yes   Diet - low sodium heart healthy   Complete by: As directed    Increase activity slowly   Complete by: As directed       Allergies as of 02/15/2022       Reactions   Jardiance [empagliflozin] Diarrhea   Entresto [sacubitril-valsartan] Cough  Medication List     STOP taking these medications    losartan 25 MG tablet Commonly known as: COZAAR       TAKE these medications    acetaminophen 650 MG CR tablet Commonly known as: TYLENOL Take 650 mg by mouth every 8 (eight) hours as needed for pain.   aspirin EC 81 MG tablet Take 1 tablet (81 mg total) by mouth  daily.   atorvastatin 80 MG tablet Commonly known as: LIPITOR Take 1 tablet (80 mg total) by mouth daily. Start taking on: February 16, 2022 What changed:  medication strength how much to take   furosemide 40 MG tablet Commonly known as: LASIX Take 1 tablet (40 mg total) by mouth daily.   gabapentin 100 MG capsule Commonly known as: NEURONTIN Take 1 capsule (100 mg total) by mouth 3 (three) times daily. What changed: additional instructions   isosorbide mononitrate 30 MG 24 hr tablet Commonly known as: IMDUR Take 1 tablet (30 mg total) by mouth daily. Start taking on: February 16, 2022   metoprolol succinate 25 MG 24 hr tablet Commonly known as: TOPROL-XL Take 1 tablet by mouth once daily   potassium chloride 10 MEQ tablet Commonly known as: KLOR-CON M Take 1/2 (one-half) tablet by mouth once daily What changed: See the new instructions.   spironolactone 25 MG tablet Commonly known as: ALDACTONE Take 1 tablet (25 mg total) by mouth daily.       Allergies  Allergen Reactions   Jardiance [Empagliflozin] Diarrhea   Entresto [Sacubitril-Valsartan] Cough    Follow-up Information     Your PCP Follow up.          Marcina Millard, MD Follow up.   Specialty: Cardiology Contact information: [address] [facility] [place] [address] [phone]                  The results of significant diagnostics from this hospitalization (including imaging, microbiology, ancillary and laboratory) are listed below for reference.    Significant Diagnostic Studies: CARDIAC CATHETERIZATION  Result Date: 02/15/2022   RPAV-2 lesion is 90% stenosed.   RPDA lesion is 75% stenosed.   2nd RPL lesion is 80% stenosed.   1st Mrg lesion is 90% stenosed.   1st Diag lesion is 90% stenosed.   2nd Diag lesion is 90% stenosed.   Mid LAD-1 lesion is 10% stenosed.   Mid LAD-2 lesion is 50% stenosed.   RPAV-1 lesion is 90% stenosed.   There is moderate  to severe left ventricular systolic dysfunction.   The left ventricular ejection fraction is 25-35% by visual estimate. 1.  NSTEMI 2.  Three-vessel coronary artery disease 90% stenosis D1, 90% stenosis D2, 90% stenosis OM1, 90% stenosis RPAV 1 and 2, 75% stenosis RPDA, without obvious culprit vessel, not ideal for percutaneous revascularization or surgical revascularization 3.  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  Medical therapy 2.  Continue metoprolol succinate 3.  Add isosorbide mononitrate 30 mg daily 4.  Continue high intensity atorvastatin 5.  Probable discharge in a.m.   ECHOCARDIOGRAM COMPLETE  Result Date: 02/15/2022    ECHOCARDIOGRAM REPORT   Patient Name:   Brian Porter Date of Exam: 02/15/2022 Medical Rec #:  [id number]      Height:       69.0 in Accession #:    [id number]     Weight:       230.0 lb Date of  Birth:  [birth year]       BSA:          2.192 m Patient Age:    56 years       BP:           117/69 mmHg Patient Gender: M              HR:           58 bpm. Exam Location:  [facility] Procedure: 2D Echo, Cardiac Doppler, Color Doppler and Intracardiac            Opacification Agent Indications:     Chest pain R07.9  History:         Patient has prior history of Echocardiogram examinations, most                  recent 10/22/2020. CAD and Previous Myocardial Infarction; Risk                  Factors:Hypertension.  Sonographer:     Cristela Blue Referring Phys:  [id number] [facility] MICHELLE TANG Diagnosing Phys: Marcina Millard MD  Sonographer Comments: Suboptimal apical window, no subcostal window and suboptimal parasternal window. IMPRESSIONS  1. Left ventricular ejection fraction, by estimation, is 35 to 40%. The left ventricle has moderately decreased function. The left ventricle demonstrates regional wall motion abnormalities (see scoring diagram/findings for description). The left ventricular internal cavity size was  moderately dilated. Left ventricular diastolic parameters are consistent with Grade I diastolic dysfunction (impaired relaxation).  2. Right ventricular systolic function is normal. The right ventricular size is normal.  3. The mitral valve is normal in structure. Mild mitral valve regurgitation. No evidence of mitral stenosis.  4. The aortic valve is normal in structure. Aortic valve regurgitation is not visualized. No aortic stenosis is present.  5. The inferior vena cava is normal in size with greater than 50% respiratory variability, suggesting right atrial pressure of 3 mmHg. FINDINGS  Left Ventricle: Left ventricular ejection fraction, by estimation, is 35 to 40%. The left ventricle has moderately decreased function. The left ventricle demonstrates regional wall motion abnormalities. The left ventricular internal cavity size was moderately dilated. There is no left ventricular hypertrophy. Left ventricular diastolic parameters are consistent with Grade I diastolic dysfunction (impaired relaxation). Right Ventricle: The right ventricular size is normal. No increase in right ventricular wall thickness. Right ventricular systolic function is normal. Left Atrium: Left atrial size was normal in size. Right Atrium: Right atrial size was normal in size. Pericardium: There is no evidence of pericardial effusion. Mitral Valve: The mitral valve is normal in structure. Mild mitral valve regurgitation. No evidence of mitral valve stenosis. Tricuspid Valve: The tricuspid valve is normal in structure. Tricuspid valve regurgitation is mild . No evidence of tricuspid stenosis. Aortic Valve: The aortic valve is normal in structure. Aortic valve regurgitation is not visualized. No aortic stenosis is present. Aortic valve mean gradient measures 5.3 mmHg. Aortic valve peak gradient measures 10.8 mmHg. Aortic valve area, by VTI measures 2.04 cm. Pulmonic Valve: The pulmonic valve was normal in structure. Pulmonic valve  regurgitation is not visualized. No evidence of pulmonic stenosis. Aorta: The aortic root is normal in size and structure. Venous: The inferior vena cava is normal in size with greater than 50% respiratory variability, suggesting right atrial pressure of 3 mmHg. IAS/Shunts: No atrial level shunt detected by color flow Doppler.  LEFT VENTRICLE PLAX 2D LVIDd:         6.90 cm  Diastology LVIDs:         5.50 cm     LV e' medial:    7.51 cm/s LV PW:         1.30 cm     LV E/e' medial:  7.9 LV IVS:        1.10 cm     LV e' lateral:   6.53 cm/s LVOT diam:     2.20 cm     LV E/e' lateral: 9.1 LV SV:         59 LV SV Index:   27 LVOT Area:     3.80 cm  LV Volumes (MOD) LV vol d, MOD A2C: 45.4 ml LV vol d, MOD A4C: 74.1 ml LV vol s, MOD A2C: 27.4 ml LV vol s, MOD A4C: 50.1 ml LV SV MOD A2C:     18.0 ml LV SV MOD A4C:     74.1 ml LV SV MOD BP:      21.4 ml RIGHT VENTRICLE RV Basal diam:  5.00 cm RV S prime:     12.30 cm/s TAPSE (M-mode): 2.3 cm LEFT ATRIUM              Index        RIGHT ATRIUM           Index LA diam:        4.70 cm  2.14 cm/m   RA Area:     15.70 cm LA Vol (A2C):   149.0 ml 67.96 ml/m  RA Volume:   41.60 ml  18.97 ml/m LA Vol (A4C):   86.4 ml  39.41 ml/m LA Biplane Vol: 113.0 ml 51.54 ml/m  AORTIC VALVE AV Area (Vmax):    1.83 cm AV Area (Vmean):   2.05 cm AV Area (VTI):     2.04 cm AV Vmax:           164.00 cm/s AV Vmean:          103.467 cm/s AV VTI:            0.287 m AV Peak Grad:      10.8 mmHg AV Mean Grad:      5.3 mmHg LVOT Vmax:         79.00 cm/s LVOT Vmean:        55.700 cm/s LVOT VTI:          0.154 m LVOT/AV VTI ratio: 0.54  AORTA Ao Root diam: 3.50 cm MITRAL VALVE               TRICUSPID VALVE MV Area (PHT): 3.00 cm    TR Peak grad:   8.2 mmHg MV Decel Time: 253 msec    TR Vmax:        143.00 cm/s MV E velocity: 59.10 cm/s MV A velocity: 65.10 cm/s  SHUNTS MV E/A ratio:  0.91        Systemic VTI:  0.15 m                            Systemic Diam: 2.20 cm Marcina Millard MD  Electronically signed by Marcina Millard MD Signature Date/Time: 02/15/2022/8:44:32 AM    Final    DG Chest 2 View  Result Date: 02/14/2022 CLINICAL DATA:  Chest pain EXAM: CHEST - 2 VIEW COMPARISON:  October 04, 2021 FINDINGS: The cardiomediastinal silhouette is unchanged in contour. No acute pleuroparenchymal abnormality. Redemonstrated biapical emphysematous changes. Left basilar linear opacities,  unchanged, likely representing atelectasis/scarring. No pleural effusion. No pneumothorax. Visualized abdomen is unremarkable. Multilevel degenerative changes of the thoracic spine. IMPRESSION: No acute cardiopulmonary abnormality. Electronically Signed   By: Jacob Moores M.D.   On: 02/14/2022 13:19    Microbiology: No results found for this or any previous visit (from the past 240 hour(s)).   Labs: Basic Metabolic Panel: Recent Labs  Lab 02/14/22 1256 02/15/22 0704  NA 139 138  K 4.0 4.4  CL 108 107  CO2 24 26  GLUCOSE 94 102*  BUN 19 19  CREATININE 0.68 0.81  CALCIUM 8.9 8.5*   Liver Function Tests: No results for input(s): "AST", "ALT", "ALKPHOS", "BILITOT", "PROT", "ALBUMIN" in the last 168 hours. No results for input(s): "LIPASE", "AMYLASE" in the last 168 hours. No results for input(s): "AMMONIA" in the last 168 hours. CBC: Recent Labs  Lab 02/14/22 1256  WBC 10.8*  HGB 14.7  HCT 43.8  MCV 92.6  PLT 371   Cardiac Enzymes: No results for input(s): "CKTOTAL", "CKMB", "CKMBINDEX", "TROPONINI" in the last 168 hours. BNP: BNP (last 3 results) Recent Labs    10/04/21 2116 12/14/21 1730 02/14/22 1256  BNP 18.6 28.0 70.9    ProBNP (last 3 results) No results for input(s): "PROBNP" in the last 8760 hours.  CBG: No results for input(s): "GLUCAP" in the last 168 hours.     Signed:  Silvano Bilis MD.  [facility] 02/15/2022, 5:52 PM

## 2022-02-15 NOTE — Progress Notes (Signed)
*  PRELIMINARY RESULTS* Echocardiogram 2D Echocardiogram has been performed.  Cristela Blue 02/15/2022, 8:08 AM

## 2022-02-15 NOTE — Progress Notes (Signed)
Patient discharged. All belongings with pt. 

## 2022-02-15 NOTE — Progress Notes (Signed)
PROGRESS NOTE    Brian Porter [facility]  YIR:[id number] DOB: Mar 17, 1966 DOA: 02/14/2022 PCP: Pcp, No  Outpatient Specialists: heart failure clinic    Brief Narrative:   From admission h and p Brian Porter is a 56 y.o. Caucasian male with medical history significant for coronary artery disease, CHF, GERD, hypertension and obstructive sleep apnea, presented to the emergency room with acute onset of midsternal chest pain felt as burning and indigestion, which has been intermittent over the last couple of days with no radiation.  He has chronic right neck pain however.  It has been worsening with exertion and it occurred while riding his lawnmower today.  This lasted about 15 to 20 minutes with associated diaphoresis and presyncope.  With ambulation has been having more frequent episodes over the last 48 hours as well.  No nausea or vomiting.  No cough or wheezing or hemoptysis.  No leg pain or worsening edema or recent travels or surgeries.   ED Course: When he came to the ER, vital signs were within normal.  BMP was within normal.  High-sensitivity troponin I was 389 and later 442.  CBC showed WBC of 10.8.  Coag profile was within normal. EKG as reviewed by me : EKG showed normal sinus rhythm rate of 75 with left axis deviation and minimal voltage criteria for LVH.  It showed Q waves anteroseptally and T wave inversion laterally. Imaging: Two-view chest x-ray showed no acute cardiopulmonary disease.   The patient was given 40 aspirin, 650 mg p.o. Tylenol, nitro Nitropaste and was started on IV heparin with bolus and drip.  He will be admitted to a progressive unit bed for further evaluation and management.  Assessment & Plan:   Principal Problem:   NSTEMI (non-ST elevated myocardial infarction) (HCC) Active Problems:   Tobacco abuse   CVA (cerebral vascular accident) (HCC)   PAD (peripheral artery disease) (HCC)   Dyslipidemia   Essential hypertension   Chronic systolic CHF (congestive heart  failure) (HCC)  # NSTEMI Chest pain resolved, hemodynamically stable, troponin peak 558. Cardiology following - continue IV heparin - for LHC today - statin, aspirin, BB - nitroglycerine  # CAD Hx PCI x1 ~2014, most recent LHC 2017 with multi-vessel disease medically managed - meds and LHC as above  # HFrEF Euvolemic on exam. Followed in heart failure clinic but does not have a cardiologist. EF 35-40% on today's TTE with G1 dd.  - holding home lasix for now - holding home losartan 2/2 hypotension - continuing metop, spiro - consider entresto  # HTN Here bp low normal - meds as above  # Obesity Noted  # Hx CVA - meds as above   DVT prophylaxis: IV heparin Code Status: full Family Communication: girlfriend tammy updated telephonically 8/18  Level of care: Progressive Status is: Inpatient Remains inpatient appropriate because: severity of illness    Consultants:  cardiology  Procedures: pending  Antimicrobials:  none    Subjective: Chest pain resolved, no dyspnea, feeling at baseline  Objective: Vitals:   02/14/22 2227 02/15/22 0010 02/15/22 0507 02/15/22 0806  BP: 117/78 107/67 117/69 103/66  Pulse: 60 65 (!) 58 60  Resp: 19 19 19 17   Temp: 97.7 F (36.5 C) 97.9 F (36.6 C) 97.8 F (36.6 C) 97.8 F (36.6 C)  TempSrc: Oral Oral Oral Oral  SpO2: 98% 93% 99% 96%  Weight:      Height:        Intake/Output Summary (Last 24 hours) at 02/15/2022 1147  Last data filed at 02/15/2022 1100 Gross per 24 hour  Intake 295.22 ml  Output 750 ml  Net -454.78 ml   Filed Weights   02/14/22 1254  Weight: 104.3 kg    Examination:  General exam: Appears calm and comfortable  Respiratory system: Clear to auscultation. Respiratory effort normal. Cardiovascular system: S1 & S2 heard, RRR. No JVD, murmurs, rubs, gallops or clicks. No pedal edema. Gastrointestinal system: Abdomen is nondistended, soft and nontender. No organomegaly or masses felt. Normal bowel  sounds heard. Central nervous system: Alert and oriented. No focal neurological deficits. Extremities: Symmetric 5 x 5 power. Skin: No rashes, lesions or ulcers Psychiatry: Judgement and insight appear normal. Mood & affect appropriate.     Data Reviewed: I have personally reviewed following labs and imaging studies  CBC: Recent Labs  Lab 02/14/22 1256  WBC 10.8*  HGB 14.7  HCT 43.8  MCV 92.6  PLT 371   Basic Metabolic Panel: Recent Labs  Lab 02/14/22 1256 02/15/22 0704  NA 139 138  K 4.0 4.4  CL 108 107  CO2 24 26  GLUCOSE 94 102*  BUN 19 19  CREATININE 0.68 0.81  CALCIUM 8.9 8.5*   GFR: Estimated Creatinine Clearance: 119.7 mL/min (by C-G formula based on SCr of 0.81 mg/dL). Liver Function Tests: No results for input(s): "AST", "ALT", "ALKPHOS", "BILITOT", "PROT", "ALBUMIN" in the last 168 hours. No results for input(s): "LIPASE", "AMYLASE" in the last 168 hours. No results for input(s): "AMMONIA" in the last 168 hours. Coagulation Profile: Recent Labs  Lab 02/14/22 1509  INR 1.1   Cardiac Enzymes: No results for input(s): "CKTOTAL", "CKMB", "CKMBINDEX", "TROPONINI" in the last 168 hours. BNP (last 3 results) No results for input(s): "PROBNP" in the last 8760 hours. HbA1C: No results for input(s): "HGBA1C" in the last 72 hours. CBG: No results for input(s): "GLUCAP" in the last 168 hours. Lipid Profile: Recent Labs    02/15/22 0704  CHOL 105  HDL 31*  LDLCALC 58  TRIG 78  CHOLHDL 3.4   Thyroid Function Tests: No results for input(s): "TSH", "T4TOTAL", "FREET4", "T3FREE", "THYROIDAB" in the last 72 hours. Anemia Panel: No results for input(s): "VITAMINB12", "FOLATE", "FERRITIN", "TIBC", "IRON", "RETICCTPCT" in the last 72 hours. Urine analysis:    Component Value Date/Time   COLORURINE YELLOW (A) 12/14/2021 1730   APPEARANCEUR CLEAR (A) 12/14/2021 1730   LABSPEC 1.014 12/14/2021 1730   PHURINE 5.0 12/14/2021 1730   GLUCOSEU NEGATIVE  12/14/2021 1730   HGBUR NEGATIVE 12/14/2021 1730   BILIRUBINUR NEGATIVE 12/14/2021 1730   KETONESUR NEGATIVE 12/14/2021 1730   PROTEINUR NEGATIVE 12/14/2021 1730   UROBILINOGEN 1.0 12/10/2014 2035   NITRITE NEGATIVE 12/14/2021 1730   LEUKOCYTESUR NEGATIVE 12/14/2021 1730   Sepsis Labs: @LABRCNTIP (procalcitonin:4,lacticidven:4)  )No results found for this or any previous visit (from the past 240 hour(s)).       Radiology Studies: ECHOCARDIOGRAM COMPLETE  Result Date: 02/15/2022    ECHOCARDIOGRAM REPORT   Patient Name:   Chick KEALAN BUCHAN Date of Exam: 02/15/2022 Medical Rec #:  02/17/2022      Height:       69.0 in Accession #:    [id number]     Weight:       230.0 lb Date of Birth:  1965-07-11       BSA:          2.192 m Patient Age:    56 years       BP:  117/69 mmHg Patient Gender: M              HR:           58 bpm. Exam Location:  [facility] Procedure: 2D Echo, Cardiac Doppler, Color Doppler and Intracardiac            Opacification Agent Indications:     Chest pain R07.9  History:         Patient has prior history of Echocardiogram examinations, most                  recent 10/22/2020. CAD and Previous Myocardial Infarction; Risk                  Factors:Hypertension.  Sonographer:     Cristela Blue Referring Phys:  [id number] [facility] MICHELLE TANG Diagnosing Phys: Marcina Millard MD  Sonographer Comments: Suboptimal apical window, no subcostal window and suboptimal parasternal window. IMPRESSIONS  1. Left ventricular ejection fraction, by estimation, is 35 to 40%. The left ventricle has moderately decreased function. The left ventricle demonstrates regional wall motion abnormalities (see scoring diagram/findings for description). The left ventricular internal cavity size was moderately dilated. Left ventricular diastolic parameters are consistent with Grade I diastolic dysfunction (impaired relaxation).  2. Right ventricular systolic function is normal. The right ventricular size is normal.  3.  The mitral valve is normal in structure. Mild mitral valve regurgitation. No evidence of mitral stenosis.  4. The aortic valve is normal in structure. Aortic valve regurgitation is not visualized. No aortic stenosis is present.  5. The inferior vena cava is normal in size with greater than 50% respiratory variability, suggesting right atrial pressure of 3 mmHg. FINDINGS  Left Ventricle: Left ventricular ejection fraction, by estimation, is 35 to 40%. The left ventricle has moderately decreased function. The left ventricle demonstrates regional wall motion abnormalities. The left ventricular internal cavity size was moderately dilated. There is no left ventricular hypertrophy. Left ventricular diastolic parameters are consistent with Grade I diastolic dysfunction (impaired relaxation). Right Ventricle: The right ventricular size is normal. No increase in right ventricular wall thickness. Right ventricular systolic function is normal. Left Atrium: Left atrial size was normal in size. Right Atrium: Right atrial size was normal in size. Pericardium: There is no evidence of pericardial effusion. Mitral Valve: The mitral valve is normal in structure. Mild mitral valve regurgitation. No evidence of mitral valve stenosis. Tricuspid Valve: The tricuspid valve is normal in structure. Tricuspid valve regurgitation is mild . No evidence of tricuspid stenosis. Aortic Valve: The aortic valve is normal in structure. Aortic valve regurgitation is not visualized. No aortic stenosis is present. Aortic valve mean gradient measures 5.3 mmHg. Aortic valve peak gradient measures 10.8 mmHg. Aortic valve area, by VTI measures 2.04 cm. Pulmonic Valve: The pulmonic valve was normal in structure. Pulmonic valve regurgitation is not visualized. No evidence of pulmonic stenosis. Aorta: The aortic root is normal in size and structure. Venous: The inferior vena cava is normal in size with greater than 50% respiratory variability, suggesting  right atrial pressure of 3 mmHg. IAS/Shunts: No atrial level shunt detected by color flow Doppler.  LEFT VENTRICLE PLAX 2D LVIDd:         6.90 cm     Diastology LVIDs:         5.50 cm     LV e' medial:    7.51 cm/s LV PW:         1.30 cm     LV E/e'  medial:  7.9 LV IVS:        1.10 cm     LV e' lateral:   6.53 cm/s LVOT diam:     2.20 cm     LV E/e' lateral: 9.1 LV SV:         59 LV SV Index:   27 LVOT Area:     3.80 cm  LV Volumes (MOD) LV vol d, MOD A2C: 45.4 ml LV vol d, MOD A4C: 74.1 ml LV vol s, MOD A2C: 27.4 ml LV vol s, MOD A4C: 50.1 ml LV SV MOD A2C:     18.0 ml LV SV MOD A4C:     74.1 ml LV SV MOD BP:      21.4 ml RIGHT VENTRICLE RV Basal diam:  5.00 cm RV S prime:     12.30 cm/s TAPSE (M-mode): 2.3 cm LEFT ATRIUM              Index        RIGHT ATRIUM           Index LA diam:        4.70 cm  2.14 cm/m   RA Area:     15.70 cm LA Vol (A2C):   149.0 ml 67.96 ml/m  RA Volume:   41.60 ml  18.97 ml/m LA Vol (A4C):   86.4 ml  39.41 ml/m LA Biplane Vol: 113.0 ml 51.54 ml/m  AORTIC VALVE AV Area (Vmax):    1.83 cm AV Area (Vmean):   2.05 cm AV Area (VTI):     2.04 cm AV Vmax:           164.00 cm/s AV Vmean:          103.467 cm/s AV VTI:            0.287 m AV Peak Grad:      10.8 mmHg AV Mean Grad:      5.3 mmHg LVOT Vmax:         79.00 cm/s LVOT Vmean:        55.700 cm/s LVOT VTI:          0.154 m LVOT/AV VTI ratio: 0.54  AORTA Ao Root diam: 3.50 cm MITRAL VALVE               TRICUSPID VALVE MV Area (PHT): 3.00 cm    TR Peak grad:   8.2 mmHg MV Decel Time: 253 msec    TR Vmax:        143.00 cm/s MV E velocity: 59.10 cm/s MV A velocity: 65.10 cm/s  SHUNTS MV E/A ratio:  0.91        Systemic VTI:  0.15 m                            Systemic Diam: 2.20 cm Marcina Millard MD Electronically signed by Marcina Millard MD Signature Date/Time: 02/15/2022/8:44:32 AM    Final    DG Chest 2 View  Result Date: 02/14/2022 CLINICAL DATA:  Chest pain EXAM: CHEST - 2 VIEW COMPARISON:  October 04, 2021 FINDINGS:  The cardiomediastinal silhouette is unchanged in contour. No acute pleuroparenchymal abnormality. Redemonstrated biapical emphysematous changes. Left basilar linear opacities, unchanged, likely representing atelectasis/scarring. No pleural effusion. No pneumothorax. Visualized abdomen is unremarkable. Multilevel degenerative changes of the thoracic spine. IMPRESSION: No acute cardiopulmonary abnormality. Electronically Signed   By: Jacob Moores M.D.   On: 02/14/2022 13:19  Scheduled Meds:  [MAR Hold] aspirin EC  81 mg Oral Daily   [MAR Hold] atorvastatin  80 mg Oral Daily   [MAR Hold] gabapentin  100 mg Oral TID   [MAR Hold] metoprolol succinate  25 mg Oral Daily   [MAR Hold] potassium chloride  10 mEq Oral Daily   [MAR Hold] sodium chloride flush  3 mL Intravenous Q12H   [MAR Hold] spironolactone  25 mg Oral Daily   Continuous Infusions:  sodium chloride     [START ON 02/16/2022] sodium chloride     heparin 1,600 Units/hr (02/15/22 0600)     LOS: 1 day     Silvano BilisNoah B Sharran Caratachea, MD [facility]   If 7PM-7AM, please contact night-coverage [email or web address] Password [email or web address] 02/15/2022, 11:47 AM

## 2022-02-15 NOTE — Progress Notes (Signed)
ANTICOAGULATION CONSULT NOTE  Pharmacy Consult for Heparin Indication: chest pain/ACS  Allergies  Allergen Reactions   Jardiance [Empagliflozin] Diarrhea   Entresto [Sacubitril-Valsartan] Cough    Patient Measurements: Height: 5\' 9"  (175.3 cm) Weight: 104.3 kg (230 lb) IBW/kg (Calculated) : 70.7 Heparin Dosing Weight: 93.2 kg  Vital Signs: Temp: 97.8 F (36.6 C) (08/18 0806) Temp Source: Oral (08/18 0806) BP: 103/66 (08/18 0806) Pulse Rate: 60 (08/18 0806)  Labs: Recent Labs    02/14/22 1256 02/14/22 1509 02/14/22 1814 02/14/22 2130 02/15/22 0704  HGB 14.7  --   --   --   --   HCT 43.8  --   --   --   --   PLT 371  --   --   --   --   APTT  --  25  --   --   --   LABPROT  --  13.6  --   --   --   INR  --  1.1  --   --   --   HEPARINUNFRC  --   --   --  0.12* 0.59  CREATININE 0.68  --   --   --   --   TROPONINIHS 389* 442* 558*  --  462*     Estimated Creatinine Clearance: 121.2 mL/min (by C-G formula based on SCr of 0.68 mg/dL).   Medical History: Past Medical History:  Diagnosis Date   CAD (coronary artery disease)    CHF (congestive heart failure) (HCC)    GERD (gastroesophageal reflux disease)    Hypertension    MI, old    Sleep apnea   Heparin Dosing Weight: 93.2 kg  Medications:  No history of chronic anticoagulant use PTA  Assessment: Pharmacy has been consulted to initiate and monitor heparin in 56yo male with history of CHF as well as CAD(not on [facility] PTA) presenting to the ED for evaluation of midsternal non-radiating chest pain and pressure over the past 2 days. Patient with troponin levels 389>442.   Baseline labs: aPTT 25 sec, INR 1.1, Hgb 14.7, Plts 371.  Goal of Therapy:  Heparin level 0.3-0.7 units/ml Monitor platelets by anticoagulation protocol: Yes   Date/Time aPTT/HL Comments 0817@2200  HL 0.12 Sub-therapeutic @ 1250 un/hr 0818@0704  HL 0.59 Therapeutic x 1  Plan:  Continue heparin infusion rate at 1600 units/hr Check HL in 6  hours to confirm therapeutic rate Continue to monitor H&H and platelets  Tashari Schoenfelder Rodriguez-Guzman PharmD, BCPS 02/15/2022 8:31 AM

## 2022-02-18 ENCOUNTER — Encounter: Payer: Self-pay | Admitting: Cardiology

## 2022-02-28 ENCOUNTER — Observation Stay
Admission: EM | Admit: 2022-02-28 | Discharge: 2022-03-01 | Disposition: A | Discharge status: Home / Self Care | Payer: Self-pay | Attending: Internal Medicine | Admitting: Internal Medicine

## 2022-02-28 ENCOUNTER — Other Ambulatory Visit: Payer: Self-pay

## 2022-02-28 ENCOUNTER — Emergency Department: Payer: Self-pay

## 2022-02-28 DIAGNOSIS — G629 Polyneuropathy, unspecified: Secondary | ICD-10-CM

## 2022-02-28 DIAGNOSIS — Z955 Presence of coronary angioplasty implant and graft: Secondary | ICD-10-CM | POA: Insufficient documentation

## 2022-02-28 DIAGNOSIS — E785 Hyperlipidemia, unspecified: Secondary | ICD-10-CM | POA: Diagnosis present

## 2022-02-28 DIAGNOSIS — Z87891 Personal history of nicotine dependence: Secondary | ICD-10-CM | POA: Insufficient documentation

## 2022-02-28 DIAGNOSIS — Z79899 Other long term (current) drug therapy: Secondary | ICD-10-CM | POA: Insufficient documentation

## 2022-02-28 DIAGNOSIS — R079 Chest pain, unspecified: Secondary | ICD-10-CM | POA: Diagnosis present

## 2022-02-28 DIAGNOSIS — I208 Other forms of angina pectoris: Secondary | ICD-10-CM

## 2022-02-28 DIAGNOSIS — Z7982 Long term (current) use of aspirin: Secondary | ICD-10-CM | POA: Insufficient documentation

## 2022-02-28 DIAGNOSIS — I25118 Atherosclerotic heart disease of native coronary artery with other forms of angina pectoris: Secondary | ICD-10-CM | POA: Insufficient documentation

## 2022-02-28 DIAGNOSIS — I110 Hypertensive heart disease with heart failure: Secondary | ICD-10-CM | POA: Insufficient documentation

## 2022-02-28 DIAGNOSIS — R072 Precordial pain: Principal | ICD-10-CM | POA: Insufficient documentation

## 2022-02-28 DIAGNOSIS — I10 Essential (primary) hypertension: Secondary | ICD-10-CM | POA: Diagnosis present

## 2022-02-28 DIAGNOSIS — I5022 Chronic systolic (congestive) heart failure: Secondary | ICD-10-CM | POA: Diagnosis present

## 2022-02-28 LAB — BASIC METABOLIC PANEL
Anion gap: 6 (ref 5–15)
BUN: 17 mg/dL (ref 6–20)
CO2: 28 mmol/L (ref 22–32)
Calcium: 9 mg/dL (ref 8.9–10.3)
Chloride: 106 mmol/L (ref 98–111)
Creatinine, Ser: 0.79 mg/dL (ref 0.61–1.24)
GFR, Estimated: >60 mL/min (ref >60)
Glucose, Bld: 98 mg/dL (ref 70–99)
Potassium: 4.2 mmol/L (ref 3.5–5.1)
Sodium: 140 mmol/L (ref 135–145)

## 2022-02-28 LAB — CBC
HCT: 44.2 % (ref 39.0–52.0)
Hemoglobin: 14.8 g/dL (ref 13.0–17.0)
MCH: 31.6 pg (ref 26.0–34.0)
MCHC: 33.5 g/dL (ref 30.0–36.0)
MCV: 94.2 fL (ref 80.0–100.0)
Platelets: 394 10*3/uL (ref 150–400)
RBC: 4.69 MIL/uL (ref 4.22–5.81)
RDW: 13 % (ref 11.5–15.5)
WBC: 10.2 10*3/uL (ref 4.0–10.5)
nRBC: 0 % (ref 0.0–0.2)

## 2022-02-28 LAB — TROPONIN I (HIGH SENSITIVITY)
Troponin I (High Sensitivity): 13 ng/L (ref <18)
Troponin I (High Sensitivity): 14 ng/L (ref <18)

## 2022-02-28 MED ORDER — TRAZODONE HCL 50 MG PO TABS
25.0000 mg | ORAL_TABLET | Freq: Every evening | ORAL | Status: DC | PRN
  Administered 2022-02-28: 25 mg via ORAL
  Filled 2022-02-28: qty 1

## 2022-02-28 MED ORDER — NITROGLYCERIN 2 % TD OINT
0.5000 [in_us] | TOPICAL_OINTMENT | Freq: Once | TRANSDERMAL | Status: AC
  Administered 2022-02-28: 0.5 [in_us] via TOPICAL
  Filled 2022-02-28: qty 1

## 2022-02-28 MED ORDER — POTASSIUM CHLORIDE CRYS ER 20 MEQ PO TBCR
10.0000 meq | EXTENDED_RELEASE_TABLET | Freq: Every day | ORAL | Status: DC
  Administered 2022-03-01: 10 meq via ORAL
  Filled 2022-02-28: qty 1

## 2022-02-28 MED ORDER — FUROSEMIDE 40 MG PO TABS
40.0000 mg | ORAL_TABLET | Freq: Every day | ORAL | Status: DC
  Administered 2022-02-28 – 2022-03-01 (×2): 40 mg via ORAL
  Filled 2022-02-28 (×2): qty 1

## 2022-02-28 MED ORDER — ONDANSETRON HCL 4 MG/2ML IJ SOLN: 4.0000 mg | Freq: Four times a day (QID) | INTRAMUSCULAR | Status: DC | PRN

## 2022-02-28 MED ORDER — ATORVASTATIN CALCIUM 20 MG PO TABS
80.0000 mg | ORAL_TABLET | Freq: Every day | ORAL | Status: DC
  Filled 2022-02-28: qty 4

## 2022-02-28 MED ORDER — ACETAMINOPHEN 650 MG RE SUPP: 650.0000 mg | Freq: Four times a day (QID) | RECTAL | Status: DC | PRN

## 2022-02-28 MED ORDER — ONDANSETRON HCL 4 MG PO TABS: 4.0000 mg | ORAL_TABLET | Freq: Four times a day (QID) | ORAL | Status: DC | PRN

## 2022-02-28 MED ORDER — CYCLOBENZAPRINE HCL 10 MG PO TABS
5.0000 mg | ORAL_TABLET | Freq: Three times a day (TID) | ORAL | Status: DC | PRN
Start: 2022-02-28 — End: 2022-03-01
  Administered 2022-03-01: 5 mg via ORAL
  Filled 2022-02-28: qty 1

## 2022-02-28 MED ORDER — ISOSORBIDE MONONITRATE ER 60 MG PO TB24: 30.0000 mg | ORAL_TABLET | Freq: Every day | ORAL | Status: DC

## 2022-02-28 MED ORDER — RANOLAZINE ER 500 MG PO TB12
500.0000 mg | ORAL_TABLET | ORAL | Status: AC
  Administered 2022-02-28: 500 mg via ORAL
  Filled 2022-02-28: qty 1

## 2022-02-28 MED ORDER — GABAPENTIN 100 MG PO CAPS
100.0000 mg | ORAL_CAPSULE | Freq: Three times a day (TID) | ORAL | Status: DC
  Administered 2022-02-28 – 2022-03-01 (×2): 100 mg via ORAL
  Filled 2022-02-28 (×2): qty 1

## 2022-02-28 MED ORDER — ENOXAPARIN SODIUM 60 MG/0.6ML IJ SOSY
0.5000 mg/kg | PREFILLED_SYRINGE | INTRAMUSCULAR | Status: DC
  Administered 2022-02-28: 52.5 mg via SUBCUTANEOUS
  Filled 2022-02-28: qty 0.6

## 2022-02-28 MED ORDER — ASPIRIN 81 MG PO TBEC
81.0000 mg | DELAYED_RELEASE_TABLET | Freq: Every day | ORAL | Status: DC
  Administered 2022-03-01: 81 mg via ORAL
  Filled 2022-02-28: qty 1

## 2022-02-28 MED ORDER — MAGNESIUM HYDROXIDE 400 MG/5ML PO SUSP: 30.0000 mL | Freq: Every day | ORAL | Status: DC | PRN

## 2022-02-28 MED ORDER — METHOCARBAMOL 500 MG PO TABS
500.0000 mg | ORAL_TABLET | Freq: Once | ORAL | Status: AC
Start: 2022-02-28 — End: 2022-02-28
  Administered 2022-02-28: 500 mg via ORAL
  Filled 2022-02-28 (×2): qty 1

## 2022-02-28 MED ORDER — ACETAMINOPHEN 325 MG PO TABS: 650.0000 mg | ORAL_TABLET | Freq: Four times a day (QID) | ORAL | Status: DC | PRN

## 2022-02-28 MED ORDER — SPIRONOLACTONE 25 MG PO TABS
25.0000 mg | ORAL_TABLET | Freq: Every day | ORAL | Status: DC
  Administered 2022-03-01: 25 mg via ORAL
  Filled 2022-02-28: qty 1

## 2022-02-28 MED ORDER — METOPROLOL SUCCINATE ER 50 MG PO TB24
25.0000 mg | ORAL_TABLET | Freq: Every day | ORAL | Status: DC
  Administered 2022-03-01: 25 mg via ORAL
  Filled 2022-02-28: qty 1

## 2022-02-28 MED ORDER — SODIUM CHLORIDE 0.9 % IV SOLN: INTRAVENOUS | Status: DC

## 2022-02-28 NOTE — ED Triage Notes (Signed)
Pt comes with c/o dizziness and CP. Pt states this started few days ago and now has gotten worse.   Pt states he did have heart cath and everything was clear. Pt states pressure in mid sternal . Pt states right arm pain.

## 2022-02-28 NOTE — Assessment & Plan Note (Signed)
-   We will continue his antihypertensives. 

## 2022-02-28 NOTE — Assessment & Plan Note (Signed)
-   We will continue his Neurontin ?

## 2022-02-28 NOTE — ED Notes (Signed)
Pt medicated per MAR. Pt provided with meal tray from [facility]. Pt denies further needs at this time. Call light is in reach, family is at bedside. WCTM.

## 2022-02-28 NOTE — Progress Notes (Signed)
PHARMACIST - PHYSICIAN COMMUNICATION  CONCERNING:  Enoxaparin (Lovenox) for DVT Prophylaxis    RECOMMENDATION: Patient was prescribed enoxaparin 40mg  q24 hours for VTE prophylaxis.   Filed Weights   02/28/22 1943  Weight: 104 kg (229 lb 4.5 oz)    Body mass index is 33.86 kg/m.  Estimated Creatinine Clearance: 121 mL/min (by C-G formula based on SCr of 0.79 mg/dL).  Based on [facility] policy patient is candidate for enoxaparin 0.5mg /kg TBW SQ every 24 hours based on BMI being >30.  DESCRIPTION: Pharmacy has adjusted enoxaparin dose per [facility] policy.  Patient is now receiving enoxaparin 52.5 mg every 24 hours   [facility] 02/28/2022 7:55 PM

## 2022-02-28 NOTE — Assessment & Plan Note (Signed)
-   This is in the setting of coronary artery disease with history of recent non-STEMI. - The patient was admitted to a cardiac telemetry bed. - We will follow serial troponins. - We will continue him on p.o. Ranexa that was started in the ER and continue high-dose statin, beta-blocker therapy with Toprol-XL. - He will be placed on aspirin as well as as needed sublingual nitroglycerin and IV morphine sulfate for pain. - Cardiology consult will be obtained. - Dr. Juliann Pares was notified about the patient.

## 2022-02-28 NOTE — Assessment & Plan Note (Signed)
-   We will continue his Aldactone as well as Toprol-XL

## 2022-02-28 NOTE — Assessment & Plan Note (Signed)
-   We will continue high-dose statin therapy. 

## 2022-02-28 NOTE — ED Notes (Signed)
Pt brought to ED rm 36, this RN now assuming care. Pt placed on cardiac monitor and given a snack. Pt is asking for medications for his leg cramps. This RN reached out to Dr. Arville Care and is waiting for their response. Pt in no obvious distress.

## 2022-02-28 NOTE — ED Notes (Addendum)
Pt presents to ED with chest tightness. Pt states the tightness started yesterday morning. Pt states he had a heart cath 8 days "because I had a mild attack".   Pt denies SOB or N/V/D. Pt denies radiation of CP.

## 2022-02-28 NOTE — ED Provider Notes (Signed)
[facility] Provider Note    Event Date/Time   First MD Initiated Contact with Patient 02/28/22 1512     (approximate)   History   Chest Pain   HPI Brian Porter is a 56 y.o. male chest pressure since about 6 or 7 AM this morning.  Similar in nature but not as severe as when he was admitted to the hospital about a week and a half ago.  He took additional aspirin a total of 324 mg this morning.  He also reports taking his blood pressure medications.  Feels like a pressure across his mid chest.  Is very mild at this time, reports it was the same feeling he had when he had a mild heart attack about a week and a half ago, but it is much lighter.  However at this point it started with him resting, previously it started when he was mowing the lawn     Physical Exam   Triage Vital Signs: ED Triage Vitals  Enc Vitals Group     BP 02/28/22 1316 (!) 141/79     Pulse Rate 02/28/22 1316 72     Resp 02/28/22 1316 18     Temp 02/28/22 1316 97.8 F (36.6 C)     Temp Source 02/28/22 1316 Oral     SpO2 02/28/22 1316 93 %     Weight --      Height --      Head Circumference --      Peak Flow --      Pain Score 02/28/22 1313 5     Pain Loc --      Pain Edu? --      Excl. in GC? --     Most recent vital signs: Vitals:   02/28/22 1316  BP: (!) 141/79  Pulse: 72  Resp: 18  Temp: 97.8 F (36.6 C)  SpO2: 93%     General: Awake, no distress.  CV:  Good peripheral perfusion.  Normal heart tones Resp:  Normal effort.  There are lung sounds bilateralAbd:  No distention.  Soft nontender Other:  No lower extremity edema   ED Results / Procedures / Treatments   Labs (all labs ordered are listed, but only abnormal results are displayed) Labs Reviewed  BASIC METABOLIC PANEL  CBC  TROPONIN I (HIGH SENSITIVITY)  TROPONIN I (HIGH SENSITIVITY)     EKG  EKG interpreted by me 1510 Heart rate 70 QRS 100 QTc 440 Normal sinus rhythm, aVL demonstrates  slight Q with very minimal less than 1 mm ST elevation. No STEMI  Compared with 02-14-22, appears similar in morphology though ST elev < 81mm is more noted on today's ekg      RADIOLOGY Chest x-ray interpreted by me as negative for acute finding  DG Chest 2 View  Result Date: 02/28/2022 CLINICAL DATA:  Left chest pain, recent heart catheterization 02/15/2022, history of CHF and coronary disease EXAM: CHEST - 2 VIEW COMPARISON:  06/01/2021, 02/14/2022 FINDINGS: Similar emphysema pattern with known scattered hyper lucent blebs in the lungs bilaterally, better demonstrated by comparison CT. No definite superimposed acute pneumonia, collapse or consolidation. Negative for edema, effusion or pneumothorax. Trachea midline. Stable mild cardiomegaly. IMPRESSION: Stable bullous emphysema pattern, better demonstrated by comparison CT. No significant interval change or acute process by plain radiography Electronically Signed   By: Judie Petit.  Shick M.D.   On: 02/28/2022 14:00       PROCEDURES:  Critical Care performed: No  Procedures   MEDICATIONS ORDERED IN ED: Medications  ranolazine (RANEXA) 12 hr tablet 500 mg (has no administration in time range)  nitroGLYCERIN (NITROGLYN) 2 % ointment 0.5 inch (0.5 inches Topical Given 02/28/22 1551)     IMPRESSION / MDM / ASSESSMENT AND PLAN / ED COURSE  I reviewed the triage vital signs and the nursing notes.                              Differential diagnosis includes, but is not limited to, ACS, aortic dissection, pulmonary embolism, cardiac tamponade, pneumothorax, pneumonia, pericarditis, myocarditis, GI-related causes including esophagitis/gastritis, and musculoskeletal chest wall pain.    Discussed with patient, will trial nitroglycerin.  He has taken aspirin already this morning.  EKG with very mild concern for slight ST elevation but not more than 1 mm actually slightly less in aVL only today.  My concern is that if he misses his cardiac pain, that  he is now experiencing what may be anginal symptoms though mild at rest.  Initial troponin normal.  I have placed a consult to his cardiology team including Dr. Juliann Pares.  Patient's presentation is most consistent with acute presentation with potential threat to life or bodily function.   The patient is on the cardiac monitor to evaluate for evidence of arrhythmia and/or significant heart rate changes.  Clinical Course as of 02/28/22 1625  Thu Feb 28, 2022  1542 Dr. Juliann Pares and Jodie Echevaria notified of cardiology consult request [MQ]    Clinical Course User Index [MQ] Sharyn Creamer, MD   ----------------------------------------- 4:24 PM on 02/28/2022 ----------------------------------------- Cardiology has made recommendation to start the patient on Ranexa which I have ordered.  Additionally they will provide more formal consultation moving forward.  First troponin is normal CBC and metabolic panel are normal.  The patient is felt to be at least moderate to high risk for ACS though and I am concerned he has developing anginal symptoms at rest today, thus discussed and consulted with the hospitalist service Dr. Mertie Moores.  Patient will be admitted with anticipation of further cardiac work-up and cardiology consult  Patient understanding agreeable with plan for admission at this time.  FINAL CLINICAL IMPRESSION(S) / ED DIAGNOSES   Final diagnoses:  Anginal chest pain at rest [facility])     Rx / DC Orders   ED Discharge Orders     None        Note:  This document was prepared using Dragon voice recognition software and may include unintentional dictation errors.   Sharyn Creamer, MD 02/28/22 [phone]

## 2022-02-28 NOTE — H&P (Signed)
[place]   PATIENT NAME: Brian Porter    MR#:  [id number]  DATE OF BIRTH:  13-Dec-1965  DATE OF ADMISSION:  02/28/2022  PRIMARY CARE PHYSICIAN: Pcp, No   Patient is coming from: Home  REQUESTING/REFERRING PHYSICIAN: Sharyn Creamer, MD  CHIEF COMPLAINT:   Chief Complaint  Patient presents with   Chest Pain    HISTORY OF PRESENT ILLNESS:  Brian Porter is a 56 y.o. male with medical history significant for coronary artery disease status post PCI and stent, who had a recent cardiac catheterization on 02/15/2022 for non-STEMI revealing three-vessel coronary artery disease with moderate to severe dilated cardiomyopathy with EF of 30% and multiple regional wall motion abnormalities with recommendation for continuing metoprolol succinate, high-dose statin and medical therapy as well as Imdur which unfortunately could not tolerate.  He came to the emergency room with acute onset of chest pain that is in his midsternal area with radiation to his right arm and graded 4-5/10 in severity with no nausea or vomiting or diaphoresis or dyspnea or palpitations associated with.  Has been having bilateral lower extremity pain for the last 6 months.  He denied any fever or chills.  No cough or wheezing or hemoptysis.  No recent travels or surgeries.  No bleeding diathesis.  No dysuria, oliguria or hematuria or flank pain.  ED Course: When he came to the ER, BP was 141/79 with otherwise normal vital signs.  Labs revealed unremarkable BMP and CBC.  High-sensitivity troponin was 13 and later 14. EKG as reviewed by me : EKG showed normal sinus rhythm with a rate of 71 with left axis deviation and poor R wave progression with Q waves in V1. Imaging: Two-view chest x-ray showed stable bullous emphysema pattern that is better compared to a previous CT with no acute abnormalities otherwise.  The patient was given Ranexa 500 mg p.o. after consultation with Dr. Juliann Pares over the phone and 1 inch of Nitropaste.   He will be admitted to an observation cardiac telemetry bed for further evaluation and management. PAST MEDICAL HISTORY:   Past Medical History:  Diagnosis Date   CAD (coronary artery disease)    CHF (congestive heart failure) (HCC)    GERD (gastroesophageal reflux disease)    Hypertension    MI, old    Sleep apnea     PAST SURGICAL HISTORY:   Past Surgical History:  Procedure Laterality Date   CARDIAC CATHETERIZATION Right 10/16/2015   Procedure: Left Heart Cath and Coronary Angiography;  Surgeon: Laurier Nancy, MD;  Location: [facility] INVASIVE CV LAB;  Service: Cardiovascular;  Laterality: Right;   CORONARY ANGIOPLASTY WITH STENT PLACEMENT  approx 2 years ago   HERNIA REPAIR Left 11/29/2016   [facility]   LEFT HEART CATH AND CORONARY ANGIOGRAPHY N/A 02/15/2022   Procedure: LEFT HEART CATH AND CORONARY ANGIOGRAPHY;  Surgeon: Marcina Millard, MD;  Location: [facility] INVASIVE CV LAB;  Service: Cardiovascular;  Laterality: N/A;  12:00 PM   PARTIAL NEPHRECTOMY Left    SPLENECTOMY      SOCIAL HISTORY:   Social History   Tobacco Use   Smoking status: Former    Packs/day: 0.50    Types: Cigarettes    Quit date: 10/24/2020    Years since quitting: 1.3   Smokeless tobacco: Never  Substance Use Topics   Alcohol use: No    Alcohol/week: 0.0 standard drinks of alcohol    FAMILY HISTORY:   Family History  Problem Relation Age of  Onset   Hypertension Mother    Other Mother        covid pneumonia   Congestive Heart Failure Mother    Heart attack Father 70   Diabetes Father    Hypertension Father    Diabetes Mellitus II Brother     DRUG ALLERGIES:   Allergies  Allergen Reactions   Entresto [Sacubitril-Valsartan] Cough   Jardiance [Empagliflozin] Diarrhea    REVIEW OF SYSTEMS:   ROS As per history of present illness. All pertinent systems were reviewed above. Constitutional, HEENT, cardiovascular, respiratory, GI, GU, musculoskeletal, neuro, psychiatric, endocrine,  integumentary and hematologic systems were reviewed and are otherwise negative/unremarkable except for positive findings mentioned above in the HPI.   MEDICATIONS AT HOME:   Prior to Admission medications   Medication Sig Start Date End Date Taking? Authorizing Provider  aspirin 81 MG EC tablet Take 1 tablet (81 mg total) by mouth daily. 10/23/20  Yes Elgergawy, Leana Roe, MD  atorvastatin (LIPITOR) 80 MG tablet Take 1 tablet (80 mg total) by mouth daily. 02/16/22  Yes Wouk, Wilfred Curtis, MD  furosemide (LASIX) 40 MG tablet Take 1 tablet (40 mg total) by mouth daily. 08/08/21  Yes Clarisa Kindred A, FNP  gabapentin (NEURONTIN) 100 MG capsule Take 1 capsule (100 mg total) by mouth 3 (three) times daily. Patient taking differently: Take 100 mg by mouth 3 (three) times daily. As needed 09/26/21  Yes Iloabachie, Chioma E, NP  metoprolol succinate (TOPROL-XL) 25 MG 24 hr tablet Take 1 tablet by mouth once daily 12/03/21  Yes Hackney, Tina A, FNP  potassium chloride (KLOR-CON M) 10 MEQ tablet Take 1/2 (one-half) tablet by mouth once daily Patient taking differently: Take 10 mEq by mouth daily. 11/03/21  Yes Clarisa Kindred A, FNP  spironolactone (ALDACTONE) 25 MG tablet Take 1 tablet (25 mg total) by mouth daily. 08/08/21  Yes Clarisa Kindred A, FNP  acetaminophen (TYLENOL) 650 MG CR tablet Take 650 mg by mouth every 8 (eight) hours as needed for pain.    [provider]  isosorbide mononitrate (IMDUR) 30 MG 24 hr tablet Take 1 tablet (30 mg total) by mouth daily. Patient not taking: Reported on 02/28/2022 02/16/22   Kathrynn Running, MD      VITAL SIGNS:  Blood pressure 135/82, pulse 65, temperature 97.8 F (36.6 C), temperature source Oral, resp. rate 15, SpO2 93 %.  PHYSICAL EXAMINATION:  Physical Exam  GENERAL:  56 y.o.-year-old Caucasian male patient lying in the bed with no acute distress.  EYES: Pupils equal, round, reactive to light and accommodation. No scleral icterus. Extraocular muscles  intact.  HEENT: Head atraumatic, normocephalic. Oropharynx and nasopharynx clear.  NECK:  Supple, no jugular venous distention. No thyroid enlargement, no tenderness.  LUNGS: Normal breath sounds bilaterally, no wheezing, rales,rhonchi or crepitation. No use of accessory muscles of respiration.  CARDIOVASCULAR: Regular rate and rhythm, S1, S2 normal. No murmurs, rubs, or gallops.  ABDOMEN: Soft, nondistended, nontender. Bowel sounds present. No organomegaly or mass.  EXTREMITIES: No pedal edema, cyanosis, or clubbing.  NEUROLOGIC: Cranial nerves II through XII are intact. Muscle strength 5/5 in all extremities. Sensation intact. Gait not checked.  PSYCHIATRIC: The patient is alert and oriented x 3.  Normal affect and good eye contact. SKIN: No obvious rash, lesion, or ulcer.   LABORATORY PANEL:   CBC Recent Labs  Lab 02/28/22 1318  WBC 10.2  HGB 14.8  HCT 44.2  PLT 394   ------------------------------------------------------------------------------------------------------------------  Chemistries  Recent Labs  Lab 02/28/22 1318  NA 140  K 4.2  CL 106  CO2 28  GLUCOSE 98  BUN 17  CREATININE 0.79  CALCIUM 9.0   ------------------------------------------------------------------------------------------------------------------  Cardiac Enzymes No results for input(s): "TROPONINI" in the last 168 hours. ------------------------------------------------------------------------------------------------------------------  RADIOLOGY:  DG Chest 2 View  Result Date: 02/28/2022 CLINICAL DATA:  Left chest pain, recent heart catheterization 02/15/2022, history of CHF and coronary disease EXAM: CHEST - 2 VIEW COMPARISON:  06/01/2021, 02/14/2022 FINDINGS: Similar emphysema pattern with known scattered hyper lucent blebs in the lungs bilaterally, better demonstrated by comparison CT. No definite superimposed acute pneumonia, collapse or consolidation. Negative for edema, effusion or  pneumothorax. Trachea midline. Stable mild cardiomegaly. IMPRESSION: Stable bullous emphysema pattern, better demonstrated by comparison CT. No significant interval change or acute process by plain radiography Electronically Signed   By: Judie Petit.  Shick M.D.   On: 02/28/2022 14:00      IMPRESSION AND PLAN:  Assessment and Plan: Chest pain - This is in the setting of coronary artery disease with history of recent non-STEMI. - The patient was admitted to a cardiac telemetry bed. - We will follow serial troponins. - We will continue him on p.o. Ranexa that was started in the ER and continue high-dose statin, beta-blocker therapy with Toprol-XL. - He will be placed on aspirin as well as as needed sublingual nitroglycerin and IV morphine sulfate for pain. - Cardiology consult will be obtained. - Dr. Juliann Pares was notified about the patient.  Dyslipidemia - We will continue high-dose statin therapy.  Chronic systolic CHF (congestive heart failure) (HCC) - We will continue his Aldactone as well as Toprol-XL  Peripheral neuropathy - We will continue his Neurontin  HTN (hypertension) We will continue his antihypertensives.       DVT prophylaxis: Lovenox.  Advanced Care Planning:  Code Status: full code.  Family Communication:  The plan of care was discussed in details with the patient (and family). I answered all questions. The patient agreed to proceed with the above mentioned plan. Further management will depend upon hospital course. Disposition Plan: Back to previous home environment Consults called: Cardiology. All the records are reviewed and case discussed with ED provider.  Status is: Observation  I certify that at the time of admission, it is my clinical judgment that the patient will require  hospital care extending LESS than 2 midnights.                            Dispo: The patient is from: Home              Anticipated d/c is to: Home              Patient currently is not  medically stable to d/c.              Difficult to place patient: No  Hannah Beat M.D on 02/28/2022 at 6:52 PM  [facility]   From 7 PM-7 AM, contact night-coverage [email or web address]  CC: Primary care physician; Pcp, No

## 2022-03-01 LAB — CBC
HCT: 41.7 % (ref 39.0–52.0)
Hemoglobin: 13.6 g/dL (ref 13.0–17.0)
MCH: 31.3 pg (ref 26.0–34.0)
MCHC: 32.6 g/dL (ref 30.0–36.0)
MCV: 95.9 fL (ref 80.0–100.0)
Platelets: 382 10*3/uL (ref 150–400)
RBC: 4.35 MIL/uL (ref 4.22–5.81)
RDW: 12.9 % (ref 11.5–15.5)
WBC: 10.7 10*3/uL — ABNORMAL HIGH (ref 4.0–10.5)
nRBC: 0 % (ref 0.0–0.2)

## 2022-03-01 LAB — BASIC METABOLIC PANEL
Anion gap: 5 (ref 5–15)
BUN: 17 mg/dL (ref 6–20)
CO2: 27 mmol/L (ref 22–32)
Calcium: 8.6 mg/dL — ABNORMAL LOW (ref 8.9–10.3)
Chloride: 109 mmol/L (ref 98–111)
Creatinine, Ser: 0.86 mg/dL (ref 0.61–1.24)
GFR, Estimated: >60 mL/min (ref >60)
Glucose, Bld: 109 mg/dL — ABNORMAL HIGH (ref 70–99)
Potassium: 4.1 mmol/L (ref 3.5–5.1)
Sodium: 141 mmol/L (ref 135–145)

## 2022-03-01 LAB — HIV ANTIBODY (ROUTINE TESTING W REFLEX): HIV Screen 4th Generation wRfx: NONREACTIVE

## 2022-03-01 MED ORDER — SENNOSIDES-DOCUSATE SODIUM 8.6-50 MG PO TABS: 1.0000 | ORAL_TABLET | Freq: Every evening | ORAL | Status: DC | PRN

## 2022-03-01 MED ORDER — RANOLAZINE ER 500 MG PO TB12: 500.0000 mg | ORAL_TABLET | Freq: Two times a day (BID) | ORAL | 0 refills | Status: DC

## 2022-03-01 MED ORDER — RANOLAZINE ER 500 MG PO TB12
500.0000 mg | ORAL_TABLET | Freq: Two times a day (BID) | ORAL | Status: DC
  Administered 2022-03-01: 500 mg via ORAL
  Filled 2022-03-01: qty 1

## 2022-03-01 MED ORDER — METOPROLOL TARTRATE 5 MG/5ML IV SOLN: 5.0000 mg | INTRAVENOUS | Status: DC | PRN

## 2022-03-01 MED ORDER — IPRATROPIUM-ALBUTEROL 0.5-2.5 (3) MG/3ML IN SOLN: 3.0000 mL | RESPIRATORY_TRACT | Status: DC | PRN

## 2022-03-01 MED ORDER — HYDRALAZINE HCL 20 MG/ML IJ SOLN: 10.0000 mg | INTRAMUSCULAR | Status: DC | PRN

## 2022-03-01 MED ORDER — DM-GUAIFENESIN ER 30-600 MG PO TB12: 1.0000 | ORAL_TABLET | Freq: Two times a day (BID) | ORAL | Status: DC | PRN

## 2022-03-01 MED ORDER — TRAZODONE HCL 50 MG PO TABS: 50.0000 mg | ORAL_TABLET | Freq: Every evening | ORAL | Status: DC | PRN

## 2022-03-01 MED ORDER — CYCLOBENZAPRINE HCL 5 MG PO TABS: 5.0000 mg | ORAL_TABLET | Freq: Three times a day (TID) | ORAL | 0 refills | Status: DC | PRN

## 2022-03-01 NOTE — Discharge Summary (Signed)
Physician Discharge Summary  Brian Porter [facility] [id number] DOB: [birth year] DOA: 02/28/2022  PCP: Pcp, No  Admit date: 02/28/2022 Discharge date: 03/01/2022  Admitted From: Home Disposition:  Home  Recommendations for Outpatient Follow-up:  Follow up with PCP in 1-2 weeks Please obtain BMP/CBC in one week your next doctors visit.  Continue home meds.  Started Ranexa 500 mg twice daily.  Does not want to take nitrates. Outpatient follow-up with cardiology next week, to be arranged by their service   Discharge Condition: Stable CODE STATUS: Full code Diet recommendation: Heart healthy  Brief/Interim Summary:  56 y.o. male with medical history significant for coronary artery disease status post PCI and stent, who had a recent cardiac catheterization on 02/15/2022 for non-STEMI revealing three-vessel coronary artery disease with moderate to severe dilated cardiomyopathy with EF of 30% and multiple regional wall motion abnormalities with recommendation for continuing metoprolol succinate, high-dose statin and medical therapy as well as Imdur which unfortunately could not tolerate.  He came to the emergency room with acute onset of chest pain that is in his midsternal area with radiation to his right arm and graded 4-5/10 in severity with no nausea or vomiting or diaphoresis or dyspnea or palpitations associated with.  Troponins were flat upon admission, EKG did not show any acute ST-T changes.  Chest x-ray showed bullous emphysema.  Admitting provider discussed with cardiology who recommended Nitropaste and Ranexa. Patient did well on Ranexa, cleared by cardiology for discharge.     Assessment & Plan:  Active Problems:   Chest pain   Dyslipidemia   Chronic systolic CHF (congestive heart failure) (HCC)   HTN (hypertension)   Peripheral neuropathy       Assessment and Plan: Chest pain Severe triple-vessel coronary artery disease -Patient recently underwent cardiac catheterization, no PCI  performed- medical therapy recommended.  Troponins remain flat, EKG is negative for any acute ST-T changes  He stated he does not like nitrate as makes him feel weak.  Ranexa twice daily started, sent to his pharmacy. -Aspirin, statin, Toprol-XL, Aldactone   Dyslipidemia - We will continue high-dose statin therapy.   Chronic systolic CHF (congestive heart failure) (HCC) - Continue home aspirin, statin, Toprol-XL, Aldactone   Peripheral neuropathy - Gabapentin   HTN (hypertension) -On Toprol-XL, Aldactone.  IV as needed ordered      Discharge Diagnoses:  Active Problems:   Chest pain   Dyslipidemia   Chronic systolic CHF (congestive heart failure) ([place])   HTN (hypertension)   Peripheral neuropathy      Consultations: Cardiology  Subjective:  Feels well no complaints.  Wishes to go home.  This morning he is chest pain-free  Discharge Exam: Vitals:   03/01/22 0723 03/01/22 1115  BP: (!) 108/56 120/74  Pulse: (!) 55 78  Resp: 12 15  Temp: 97.9 F (36.6 C) 97.7 F (36.5 C)  SpO2: 96% 96%   Vitals:   03/01/22 0245 03/01/22 0300 03/01/22 0723 03/01/22 1115  BP: 116/76 110/78 (!) 108/56 120/74  Pulse: 72 70 (!) 55 78  Resp: 17 10 12 15   Temp:  98 F (36.7 C) 97.9 F (36.6 C) 97.7 F (36.5 C)  TempSrc:   Oral Oral  SpO2: 95% 93% 96% 96%  Weight:      Height:        General: Pt is alert, awake, not in acute distress Cardiovascular: RRR, S1/S2 +, no rubs, no gallops Respiratory: CTA bilaterally, no wheezing, no rhonchi Abdominal: Soft, NT, ND, bowel sounds + Extremities:  no edema, no cyanosis  Discharge Instructions   Allergies as of 03/01/2022       Reactions   Entresto [sacubitril-valsartan] Cough   Jardiance [empagliflozin] Diarrhea        Medication List     STOP taking these medications    isosorbide mononitrate 30 MG 24 hr tablet Commonly known as: IMDUR       TAKE these medications    acetaminophen 650 MG CR tablet Commonly  known as: TYLENOL Take 650 mg by mouth every 8 (eight) hours as needed for pain.   aspirin EC 81 MG tablet Take 1 tablet (81 mg total) by mouth daily.   atorvastatin 80 MG tablet Commonly known as: LIPITOR Take 1 tablet (80 mg total) by mouth daily.   cyclobenzaprine 5 MG tablet Commonly known as: FLEXERIL Take 1 tablet (5 mg total) by mouth 3 (three) times daily as needed for muscle spasms.   furosemide 40 MG tablet Commonly known as: LASIX Take 1 tablet (40 mg total) by mouth daily.   gabapentin 100 MG capsule Commonly known as: NEURONTIN Take 1 capsule (100 mg total) by mouth 3 (three) times daily. What changed: additional instructions   metoprolol succinate 25 MG 24 hr tablet Commonly known as: TOPROL-XL Take 1 tablet by mouth once daily   potassium chloride 10 MEQ tablet Commonly known as: KLOR-CON M Take 1/2 (one-half) tablet by mouth once daily What changed: See the new instructions.   ranolazine 500 MG 12 hr tablet Commonly known as: RANEXA Take 1 tablet (500 mg total) by mouth 2 (two) times daily.   spironolactone 25 MG tablet Commonly known as: ALDACTONE Take 1 tablet (25 mg total) by mouth daily.        Allergies  Allergen Reactions   Entresto [Sacubitril-Valsartan] Cough   Jardiance [Empagliflozin] Diarrhea    You were cared for by a hospitalist during your hospital stay. If you have any questions about your discharge medications or the care you received while you were in the hospital after you are discharged, you can call the unit and asked to speak with the hospitalist on call if the hospitalist that took care of you is not available. Once you are discharged, your primary care physician will handle any further medical issues. Please note that no refills for any discharge medications will be authorized once you are discharged, as it is imperative that you return to your primary care physician (or establish a relationship with a primary care physician if  you do not have one) for your aftercare needs so that they can reassess your need for medications and monitor your lab values.   Procedures/Studies: DG Chest 2 View  Result Date: 02/28/2022 CLINICAL DATA:  Left chest pain, recent heart catheterization 02/15/2022, history of CHF and coronary disease EXAM: CHEST - 2 VIEW COMPARISON:  06/01/2021, 02/14/2022 FINDINGS: Similar emphysema pattern with known scattered hyper lucent blebs in the lungs bilaterally, better demonstrated by comparison CT. No definite superimposed acute pneumonia, collapse or consolidation. Negative for edema, effusion or pneumothorax. Trachea midline. Stable mild cardiomegaly. IMPRESSION: Stable bullous emphysema pattern, better demonstrated by comparison CT. No significant interval change or acute process by plain radiography Electronically Signed   By: Judie Petit.  Shick M.D.   On: 02/28/2022 14:00   CARDIAC CATHETERIZATION  Result Date: 02/15/2022   RPAV-2 lesion is 90% stenosed.   RPDA lesion is 75% stenosed.   2nd RPL lesion is 80% stenosed.   1st Mrg lesion is 90% stenosed.  1st Diag lesion is 90% stenosed.   2nd Diag lesion is 90% stenosed.   Mid LAD-1 lesion is 10% stenosed.   Mid LAD-2 lesion is 50% stenosed.   RPAV-1 lesion is 90% stenosed.   There is moderate to severe left ventricular systolic dysfunction.   The left ventricular ejection fraction is 25-35% by visual estimate. 1.  NSTEMI 2.  Three-vessel coronary artery disease 90% stenosis D1, 90% stenosis D2, 90% stenosis OM1, 90% stenosis RPAV 1 and 2, 75% stenosis RPDA, without obvious culprit vessel, not ideal for percutaneous revascularization or surgical revascularization 3.  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  Medical therapy 2.  Continue metoprolol succinate 3.  Add isosorbide mononitrate 30 mg daily 4.  Continue high intensity atorvastatin 5.  Probable discharge in a.m.    ECHOCARDIOGRAM COMPLETE  Result Date: 02/15/2022    ECHOCARDIOGRAM REPORT   Patient Name:   Brian Porter Date of Exam: 02/15/2022 Medical Rec #:  [id number]      Height:       69.0 in Accession #:    [id number]     Weight:       230.0 lb Date of Birth:  12-23-1965       BSA:          2.192 m Patient Age:    10 years       BP:           117/69 mmHg Patient Gender: M              HR:           58 bpm. Exam Location:  [facility] Procedure: 2D Echo, Cardiac Doppler, Color Doppler and Intracardiac            Opacification Agent Indications:     Chest pain R07.9  History:         Patient has prior history of Echocardiogram examinations, most                  recent 10/22/2020. CAD and Previous Myocardial Infarction; Risk                  Factors:Hypertension.  Sonographer:     Sherrie Sport Referring Phys:  [id number] Evangeline TANG Diagnosing Phys: Isaias Cowman MD  Sonographer Comments: Suboptimal apical window, no subcostal window and suboptimal parasternal window. IMPRESSIONS  1. Left ventricular ejection fraction, by estimation, is 35 to 40%. The left ventricle has moderately decreased function. The left ventricle demonstrates regional wall motion abnormalities (see scoring diagram/findings for description). The left ventricular internal cavity size was moderately dilated. Left ventricular diastolic parameters are consistent with Grade I diastolic dysfunction (impaired relaxation).  2. Right ventricular systolic function is normal. The right ventricular size is normal.  3. The mitral valve is normal in structure. Mild mitral valve regurgitation. No evidence of mitral stenosis.  4. The aortic valve is normal in structure. Aortic valve regurgitation is not visualized. No aortic stenosis is present.  5. The inferior vena cava is normal in size with greater than 50% respiratory variability, suggesting right atrial pressure of 3 mmHg. FINDINGS  Left Ventricle: Left ventricular ejection fraction, by estimation, is 35 to  40%. The left ventricle has moderately decreased function. The left ventricle demonstrates regional wall motion abnormalities. The left ventricular internal cavity size was moderately dilated. There is no left ventricular hypertrophy. Left ventricular diastolic parameters are consistent with Grade I  diastolic dysfunction (impaired relaxation). Right Ventricle: The right ventricular size is normal. No increase in right ventricular wall thickness. Right ventricular systolic function is normal. Left Atrium: Left atrial size was normal in size. Right Atrium: Right atrial size was normal in size. Pericardium: There is no evidence of pericardial effusion. Mitral Valve: The mitral valve is normal in structure. Mild mitral valve regurgitation. No evidence of mitral valve stenosis. Tricuspid Valve: The tricuspid valve is normal in structure. Tricuspid valve regurgitation is mild . No evidence of tricuspid stenosis. Aortic Valve: The aortic valve is normal in structure. Aortic valve regurgitation is not visualized. No aortic stenosis is present. Aortic valve mean gradient measures 5.3 mmHg. Aortic valve peak gradient measures 10.8 mmHg. Aortic valve area, by VTI measures 2.04 cm. Pulmonic Valve: The pulmonic valve was normal in structure. Pulmonic valve regurgitation is not visualized. No evidence of pulmonic stenosis. Aorta: The aortic root is normal in size and structure. Venous: The inferior vena cava is normal in size with greater than 50% respiratory variability, suggesting right atrial pressure of 3 mmHg. IAS/Shunts: No atrial level shunt detected by color flow Doppler.  LEFT VENTRICLE PLAX 2D LVIDd:         6.90 cm     Diastology LVIDs:         5.50 cm     LV e' medial:    7.51 cm/s LV PW:         1.30 cm     LV E/e' medial:  7.9 LV IVS:        1.10 cm     LV e' lateral:   6.53 cm/s LVOT diam:     2.20 cm     LV E/e' lateral: 9.1 LV SV:         59 LV SV Index:   27 LVOT Area:     3.80 cm  LV Volumes (MOD) LV vol d,  MOD A2C: 45.4 ml LV vol d, MOD A4C: 74.1 ml LV vol s, MOD A2C: 27.4 ml LV vol s, MOD A4C: 50.1 ml LV SV MOD A2C:     18.0 ml LV SV MOD A4C:     74.1 ml LV SV MOD BP:      21.4 ml RIGHT VENTRICLE RV Basal diam:  5.00 cm RV S prime:     12.30 cm/s TAPSE (M-mode): 2.3 cm LEFT ATRIUM              Index        RIGHT ATRIUM           Index LA diam:        4.70 cm  2.14 cm/m   RA Area:     15.70 cm LA Vol (A2C):   149.0 ml 67.96 ml/m  RA Volume:   41.60 ml  18.97 ml/m LA Vol (A4C):   86.4 ml  39.41 ml/m LA Biplane Vol: 113.0 ml 51.54 ml/m  AORTIC VALVE AV Area (Vmax):    1.83 cm AV Area (Vmean):   2.05 cm AV Area (VTI):     2.04 cm AV Vmax:           164.00 cm/s AV Vmean:          103.467 cm/s AV VTI:            0.287 m AV Peak Grad:      10.8 mmHg AV Mean Grad:      5.3 mmHg LVOT Vmax:  79.00 cm/s LVOT Vmean:        55.700 cm/s LVOT VTI:          0.154 m LVOT/AV VTI ratio: 0.54  AORTA Ao Root diam: 3.50 cm MITRAL VALVE               TRICUSPID VALVE MV Area (PHT): 3.00 cm    TR Peak grad:   8.2 mmHg MV Decel Time: 253 msec    TR Vmax:        143.00 cm/s MV E velocity: 59.10 cm/s MV A velocity: 65.10 cm/s  SHUNTS MV E/A ratio:  0.91        Systemic VTI:  0.15 m                            Systemic Diam: 2.20 cm Isaias Cowman MD Electronically signed by Isaias Cowman MD Signature Date/Time: 02/15/2022/8:44:32 AM    Final    DG Chest 2 View  Result Date: 02/14/2022 CLINICAL DATA:  Chest pain EXAM: CHEST - 2 VIEW COMPARISON:  October 04, 2021 FINDINGS: The cardiomediastinal silhouette is unchanged in contour. No acute pleuroparenchymal abnormality. Redemonstrated biapical emphysematous changes. Left basilar linear opacities, unchanged, likely representing atelectasis/scarring. No pleural effusion. No pneumothorax. Visualized abdomen is unremarkable. Multilevel degenerative changes of the thoracic spine. IMPRESSION: No acute cardiopulmonary abnormality. Electronically Signed   By: Beryle Flock  M.D.   On: 02/14/2022 13:19     The results of significant diagnostics from this hospitalization (including imaging, microbiology, ancillary and laboratory) are listed below for reference.     Microbiology: No results found for this or any previous visit (from the past 240 hour(s)).   Labs: BNP (last 3 results) Recent Labs    10/04/21 2116 12/14/21 1730 02/14/22 1256  BNP 18.6 28.0 [id number]   Basic Metabolic Panel: Recent Labs  Lab 02/28/22 1318 03/01/22 0527  NA 140 141  K 4.2 4.1  CL 106 109  CO2 28 27  GLUCOSE 98 109*  BUN 17 17  CREATININE 0.79 0.86  CALCIUM 9.0 8.6*   Liver Function Tests: No results for input(s): "AST", "ALT", "ALKPHOS", "BILITOT", "PROT", "ALBUMIN" in the last 168 hours. No results for input(s): "LIPASE", "AMYLASE" in the last 168 hours. No results for input(s): "AMMONIA" in the last 168 hours. CBC: Recent Labs  Lab 02/28/22 1318 03/01/22 0527  WBC 10.2 10.7*  HGB 14.8 13.6  HCT 44.2 41.7  MCV 94.2 95.9  PLT 394 382   Cardiac Enzymes: No results for input(s): "CKTOTAL", "CKMB", "CKMBINDEX", "TROPONINI" in the last 168 hours. BNP: Invalid input(s): "POCBNP" CBG: No results for input(s): "GLUCAP" in the last 168 hours. D-Dimer No results for input(s): "DDIMER" in the last 72 hours. Hgb A1c No results for input(s): "HGBA1C" in the last 72 hours. Lipid Profile No results for input(s): "CHOL", "HDL", "LDLCALC", "TRIG", "CHOLHDL", "LDLDIRECT" in the last 72 hours. Thyroid function studies No results for input(s): "TSH", "T4TOTAL", "T3FREE", "THYROIDAB" in the last 72 hours.  Invalid input(s): "FREET3" Anemia work up No results for input(s): "VITAMINB12", "FOLATE", "FERRITIN", "TIBC", "IRON", "RETICCTPCT" in the last 72 hours. Urinalysis    Component Value Date/Time   COLORURINE YELLOW (A) 12/14/2021 1730   APPEARANCEUR CLEAR (A) 12/14/2021 1730   LABSPEC 1.014 12/14/2021 1730   PHURINE 5.0 12/14/2021 1730   GLUCOSEU NEGATIVE  12/14/2021 1730   HGBUR NEGATIVE 12/14/2021 [facility] 12/14/2021 [place] 12/14/2021 1730  PROTEINUR NEGATIVE 12/14/2021 1730   UROBILINOGEN 1.0 12/10/2014 2035   NITRITE NEGATIVE 12/14/2021 1730   LEUKOCYTESUR NEGATIVE 12/14/2021 1730   Sepsis Labs Recent Labs  Lab 02/28/22 1318 03/01/22 0527  WBC 10.2 10.7*   Microbiology No results found for this or any previous visit (from the past 240 hour(s)).   Time coordinating discharge:  I have spent 35 minutes face to face with the patient and on the ward discussing the patients care, assessment, plan and disposition with other care givers. >50% of the time was devoted counseling the patient about the risks and benefits of treatment/Discharge disposition and coordinating care.   SIGNED:   Dimple Nanas, MD  [facility] 03/01/2022, 12:00 PM   If 7PM-7AM, please contact night-coverage

## 2022-03-01 NOTE — ED Notes (Signed)
Placed pt on 2L O2 nasal cannula due to repeated episodes of desaturation (upper 80s) while sleeping.

## 2022-03-01 NOTE — Consult Note (Signed)
Brian Porter is a 56 y.o. male  [id number]  Primary Cardiologist: Dr. Darrold Junker Reason for Consultation: chest pain  HPI: Brian Porter is a 56 y.o. male with medical history significant for coronary artery disease status post PCI and stent, who had a recent cardiac catheterization on 02/15/2022 for non-STEMI revealing three-vessel coronary artery disease with moderate to severe dilated cardiomyopathy with EF of 30% and multiple regional wall motion abnormalities with recommendation for continuing metoprolol succinate, high-dose statin and medical therapy as well as Imdur which unfortunately could not tolerate.  He came to the emergency room on 02/28/22 with acute onset of chest pain that is in his midsternal area with radiation to his right arm and graded 4-5/10 in severity with no nausea or vomiting or diaphoresis or dyspnea or palpitations associated with.  Has been having bilateral lower extremity pain for the last 6 months.    Review of Systems: Patient reports chest pain improved since presenting to ED yesterday.    Past Medical History:  Diagnosis Date   CAD (coronary artery disease)    CHF (congestive heart failure) (HCC)    GERD (gastroesophageal reflux disease)    Hypertension    MI, old    Sleep apnea     (Not in a hospital admission)     aspirin EC  81 mg Oral Daily   atorvastatin  80 mg Oral QHS   enoxaparin (LOVENOX) injection  0.5 mg/kg Subcutaneous Q24H   furosemide  40 mg Oral Daily   gabapentin  100 mg Oral TID   metoprolol succinate  25 mg Oral Daily   potassium chloride  10 mEq Oral Daily   spironolactone  25 mg Oral Daily    Infusions:  sodium chloride 100 mL/hr at 03/01/22 0174    Allergies  Allergen Reactions   Entresto [Sacubitril-Valsartan] Cough   Jardiance [Empagliflozin] Diarrhea    Social History   Socioeconomic History   Marital status: Single    Spouse name: Not on file   Number of children: Not on file   Years of education: Not  on file   Highest education level: Not on file  Occupational History   Occupation: unemployed  Tobacco Use   Smoking status: Former    Packs/day: 0.50    Types: Cigarettes    Quit date: 10/24/2020    Years since quitting: 1.3   Smokeless tobacco: Never  Vaping Use   Vaping Use: Never used  Substance and Sexual Activity   Alcohol use: No    Alcohol/week: 0.0 standard drinks of alcohol   Drug use: Not Currently    Comment: percocet   Sexual activity: Not Currently  Other Topics Concern   Not on file  Social History Narrative   Not on file   Social Determinants of Health   Financial Resource Strain: Low Risk  (05/05/2019)   Overall Financial Resource Strain (CARDIA)    Difficulty of Paying Living Expenses: Not hard at all  Food Insecurity: No Food Insecurity (11/16/2020)   Hunger Vital Sign    Worried About Running Out of Food in the Last Year: Never true    Ran Out of Food in the Last Year: Never true  Transportation Needs: No Transportation Needs (11/16/2020)   PRAPARE - Administrator, Civil Service (Medical): No    Lack of Transportation (Non-Medical): No  Physical Activity: Sufficiently Active (05/05/2019)   Exercise Vital Sign    Days of Exercise per Week: 7 days  Minutes of Exercise per Session: 30 min  Stress: No Stress Concern Present (05/05/2019)   [employer] of Occupational Health - Occupational Stress Questionnaire    Feeling of Stress : Not at all  Social Connections: Moderately Isolated (05/05/2019)   Social Connection and Isolation Panel [NHANES]    Frequency of Communication with Friends and Family: More than three times a week    Frequency of Social Gatherings with Friends and Family: More than three times a week    Attends Religious Services: 1 to 4 times per year    Active Member of [employer] or Organizations: No    Attends Banker Meetings: Never    Marital Status: Divorced  Catering manager Violence: Not At Risk (05/05/2019)    Humiliation, Afraid, Rape, and Kick questionnaire    Fear of Current or Ex-Partner: No    Emotionally Abused: No    Physically Abused: No    Sexually Abused: No    Family History  Problem Relation Age of Onset   Hypertension Mother    Other Mother        covid pneumonia   Congestive Heart Failure Mother    Heart attack Father 60   Diabetes Father    Hypertension Father    Diabetes Mellitus II Brother     PHYSICAL EXAM: Vitals:   03/01/22 0300 03/01/22 0723  BP: 110/78 (!) 108/56  Pulse: 70 (!) 55  Resp: 10 12  Temp: 98 F (36.7 C) 97.9 F (36.6 C)  SpO2: 93% 96%     Intake/Output Summary (Last 24 hours) at 03/01/2022 0844 Last data filed at 02/28/2022 2215 Gross per 24 hour  Intake 240 ml  Output 350 ml  Net -110 ml    General:  Well appearing. No respiratory difficulty HEENT: normal Neck: supple. no JVD. Carotids 2+ bilat; no bruits. No lymphadenopathy or thryomegaly appreciated. Cor: PMI nondisplaced. Regular rate & rhythm. No rubs, gallops or murmurs. Lungs: clear Abdomen: soft, nontender, nondistended. No hepatosplenomegaly. No bruits or masses. Good bowel sounds. Extremities: no cyanosis, clubbing, rash, edema Neuro: alert & oriented x 3, cranial nerves grossly intact. moves all 4 extremities w/o difficulty. Affect pleasant.  ECG: NSR, HR 71 bpm  Results for orders placed or performed during the hospital encounter of 02/28/22 (from the past 24 hour(s))  Basic metabolic panel     Status: None   Collection Time: 02/28/22  1:18 PM  Result Value Ref Range   Sodium 140 135 - 145 mmol/L   Potassium 4.2 3.5 - 5.1 mmol/L   Chloride 106 98 - 111 mmol/L   CO2 28 22 - 32 mmol/L   Glucose, Bld 98 70 - 99 mg/dL   BUN 17 6 - 20 mg/dL   Creatinine, Ser 3.83 0.61 - 1.24 mg/dL   Calcium 9.0 8.9 - 33.8 mg/dL   GFR, Estimated >32 >91 mL/min   Anion gap 6 5 - 15  CBC     Status: None   Collection Time: 02/28/22  1:18 PM  Result Value Ref Range   WBC 10.2 4.0 -  10.5 K/uL   RBC 4.69 4.22 - 5.81 MIL/uL   Hemoglobin 14.8 13.0 - 17.0 g/dL   HCT 91.6 60.6 - 00.4 %   MCV 94.2 80.0 - 100.0 fL   MCH 31.6 26.0 - 34.0 pg   MCHC 33.5 30.0 - 36.0 g/dL   RDW 59.9 77.4 - 14.2 %   Platelets 394 150 - 400 K/uL   nRBC 0.0 0.0 -  0.2 %  Troponin I (High Sensitivity)     Status: None   Collection Time: 02/28/22  1:18 PM  Result Value Ref Range   Troponin I (High Sensitivity) 13 <18 ng/L  Troponin I (High Sensitivity)     Status: None   Collection Time: 02/28/22  3:50 PM  Result Value Ref Range   Troponin I (High Sensitivity) 14 <18 ng/L  Basic metabolic panel     Status: Abnormal   Collection Time: 03/01/22  5:27 AM  Result Value Ref Range   Sodium 141 135 - 145 mmol/L   Potassium 4.1 3.5 - 5.1 mmol/L   Chloride 109 98 - 111 mmol/L   CO2 27 22 - 32 mmol/L   Glucose, Bld 109 (H) 70 - 99 mg/dL   BUN 17 6 - 20 mg/dL   Creatinine, Ser 4.65 0.61 - 1.24 mg/dL   Calcium 8.6 (L) 8.9 - 10.3 mg/dL   GFR, Estimated >03 >54 mL/min   Anion gap 5 5 - 15  CBC     Status: Abnormal   Collection Time: 03/01/22  5:27 AM  Result Value Ref Range   WBC 10.7 (H) 4.0 - 10.5 K/uL   RBC 4.35 4.22 - 5.81 MIL/uL   Hemoglobin 13.6 13.0 - 17.0 g/dL   HCT 65.6 81.2 - 75.1 %   MCV 95.9 80.0 - 100.0 fL   MCH 31.3 26.0 - 34.0 pg   MCHC 32.6 30.0 - 36.0 g/dL   RDW 70.0 17.4 - 94.4 %   Platelets 382 150 - 400 K/uL   nRBC 0.0 0.0 - 0.2 %   DG Chest 2 View  Result Date: 02/28/2022 CLINICAL DATA:  Left chest pain, recent heart catheterization 02/15/2022, history of CHF and coronary disease EXAM: CHEST - 2 VIEW COMPARISON:  06/01/2021, 02/14/2022 FINDINGS: Similar emphysema pattern with known scattered hyper lucent blebs in the lungs bilaterally, better demonstrated by comparison CT. No definite superimposed acute pneumonia, collapse or consolidation. Negative for edema, effusion or pneumothorax. Trachea midline. Stable mild cardiomegaly. IMPRESSION: Stable bullous emphysema pattern,  better demonstrated by comparison CT. No significant interval change or acute process by plain radiography Electronically Signed   By: Judie Petit.  Shick M.D.   On: 02/28/2022 14:00     ASSESSMENT AND PLAN: Patient presented with NSTEMI on 02/15/22, cath revealed three vessel CAD not ideal for percutaneous revascularization or surgical revascularization. Was discharged on Imdur, did not tolerate. Patient states Imdur made him feel weak. Troponin levels this visit unremarkable. Patient can be discharged home on Ranexa 500 mg twice daily and follow up in office next week for further evaluation.   Museum/gallery conservator FNP-C

## 2022-03-05 ENCOUNTER — Telehealth: Payer: Self-pay

## 2022-03-05 NOTE — Telephone Encounter (Signed)
Patient is

## 2022-03-06 ENCOUNTER — Encounter: Payer: Self-pay | Admitting: Gerontology

## 2022-03-06 ENCOUNTER — Other Ambulatory Visit: Payer: Self-pay

## 2022-03-06 ENCOUNTER — Ambulatory Visit: Payer: Self-pay | Admitting: Gerontology

## 2022-03-06 VITALS — BP 131/82 | HR 67 | Temp 97.9°F | Resp 16 | Ht 69.0 in | Wt 238.9 lb

## 2022-03-06 DIAGNOSIS — M5116 Intervertebral disc disorders with radiculopathy, lumbar region: Secondary | ICD-10-CM

## 2022-03-06 DIAGNOSIS — Z09 Encounter for follow-up examination after completed treatment for conditions other than malignant neoplasm: Secondary | ICD-10-CM

## 2022-03-06 DIAGNOSIS — J029 Acute pharyngitis, unspecified: Secondary | ICD-10-CM

## 2022-03-06 MED ORDER — SORE THROAT LOZENGES 6-10 MG MT LOZG: 1.0000 | LOZENGE | OROMUCOSAL | 0 refills | Status: DC | PRN

## 2022-03-06 NOTE — Patient Instructions (Signed)
Sore Throat A sore throat is pain, burning, irritation, or scratchiness in the throat. When you have a sore throat, you may feel pain or tenderness in your throat when you swallow or talk. Many things can cause a sore throat, including: An infection. Seasonal allergies. Dryness in the air. Irritants, such as smoke or pollution. Radiation treatment for cancer. Gastroesophageal reflux disease (GERD). A tumor. A sore throat is often the first sign of another sickness. It may happen with other symptoms, such as coughing, sneezing, fever, and swollen neck glands. Most sore throats go away without medical treatment. Follow these instructions at home:     Medicines Take over-the-counter and prescription medicines only as told by your health care provider. Children often get sore throats. Do not give your child aspirin because of the association with Reye's syndrome. Use throat sprays to soothe your throat as told by your health care provider. Managing pain To help with pain, try: Sipping warm liquids, such as broth, herbal tea, or warm water. Eating or drinking cold or frozen liquids, such as frozen ice pops. Gargling with a mixture of salt and water 3-4 times a day or as needed. To make salt water, completely dissolve -1 tsp (3-6 g) of salt in 1 cup (237 mL) of warm water. Sucking on hard candy or throat lozenges. Putting a cool-mist humidifier in your bedroom at night to moisten the air. Sitting in the bathroom with the door closed for 5-10 minutes while you run hot water in the shower. General instructions Do not use any products that contain nicotine or tobacco. These products include cigarettes, chewing tobacco, and vaping devices, such as e-cigarettes. If you need help quitting, ask your health care provider. Rest as needed. Drink enough fluid to keep your urine pale yellow. Wash your hands often with soap and water for at least 20 seconds. If soap and water are not available, use hand  sanitizer. Contact a health care provider if: You have a fever for more than 2-3 days. You have symptoms that last for more than 2-3 days. Your throat does not get better within 7 days. You have a fever and your symptoms suddenly get worse. Get help right away if: You have difficulty breathing. You cannot swallow fluids, soft foods, or your saliva. You have increased swelling in your throat or neck. You have persistent nausea and vomiting. These symptoms may represent a serious problem that is an emergency. Do not wait to see if the symptoms will go away. Get medical help right away. Call your local emergency services (911 in the [place] Do not drive yourself to the hospital. Summary A sore throat is pain, burning, irritation, or scratchiness in the throat. Many things can cause a sore throat. Take over-the-counter medicines only as told by your health care provider. Rest as needed. Drink enough fluid to keep your urine pale yellow. Contact a health care provider if your throat does not get better within 7 days. This information is not intended to replace advice given to you by your health care provider. Make sure you discuss any questions you have with your health care provider. Document Revised: 09/13/2020 Document Reviewed: 09/13/2020 Elsevier Patient Education  2023 Elsevier Inc.  

## 2022-03-06 NOTE — Progress Notes (Signed)
Established Patient Office Visit  Subjective   Patient ID: Brian Porter, male    DOB: [birth year]  Age: 56 y.o. MRN: [id number]  Chief Complaint  Patient presents with   Sore Throat    Patient c/o sore throat x 2 weeks, worse in the last week. Patient states it feels like it is swollen, denies fever. Patient has taken OTC Ibuprofen.    HPI Brian Porter is a 56 y/o male who has history of CAD, CHF, GERD, Hypertension, MI, for routine follow up visit. He was discharged from the hospital on 03/01/22. During his hospital course, he was started on Ranexa 500 mg bid for chronic angina , had a recent cardiac catheterization on 02/15/2022 for non-STEMI revealing three-vessel coronary artery disease with moderate to severe dilated cardiomyopathy with EF of 30%. Currently,he denies shortness of breath, but reports sore throat that has been going on for 2 weeks. He denies fever, chills, rhinorrea and cough. Overall, he states that he's doing well and offers no further complaint.   Review of Systems  Constitutional: Negative.   HENT:  Positive for sore throat.   Respiratory: Negative.    Cardiovascular: Negative.   Neurological: Negative.   Psychiatric/Behavioral: Negative.        Objective:     BP 131/82 (BP Location: Right Arm, Patient Position: Sitting, Cuff Size: Large)   Pulse 67   Temp 97.9 F (36.6 C) (Oral)   Resp 16   Ht 5\' 9"  (1.753 m)   Wt 238 lb 14.4 oz (108.4 kg)   SpO2 96%   BMI 35.28 kg/m  BP Readings from Last 3 Encounters:  03/06/22 131/82  03/01/22 120/74  02/15/22 132/81   Wt Readings from Last 3 Encounters:  03/06/22 238 lb 14.4 oz (108.4 kg)  02/28/22 229 lb 4.5 oz (104 kg)  02/14/22 230 lb (104.3 kg)      Physical Exam HENT:     Head: Normocephalic and atraumatic.     Right Ear: Tympanic membrane normal.     Left Ear: Tympanic membrane normal.     Mouth/Throat:     Mouth: Mucous membranes are moist. No oral lesions.     Pharynx: Oropharynx is clear.  Uvula midline. No pharyngeal swelling, oropharyngeal exudate, posterior oropharyngeal erythema or uvula swelling.     Tonsils: No tonsillar exudate or tonsillar abscesses.  Eyes:     Conjunctiva/sclera: Conjunctivae normal.     Pupils: Pupils are equal, round, and reactive to light.  Cardiovascular:     Rate and Rhythm: Normal rate and regular rhythm.     Heart sounds: Normal heart sounds.  Pulmonary:     Effort: Pulmonary effort is normal.     Breath sounds: Normal breath sounds.  Musculoskeletal:     Cervical back: Normal range of motion.  Skin:    General: Skin is warm.  Neurological:     General: No focal deficit present.     Mental Status: He is alert and oriented to person, place, and time.  Psychiatric:        Mood and Affect: Mood normal.        Behavior: Behavior normal.      No results found for any visits on 03/06/22.  Last CBC Lab Results  Component Value Date   WBC 10.7 (H) 03/01/2022   HGB 13.6 03/01/2022   HCT 41.7 03/01/2022   MCV 95.9 03/01/2022   MCH 31.3 03/01/2022   RDW 12.9 03/01/2022   PLT 382 03/01/2022  Last metabolic panel Lab Results  Component Value Date   GLUCOSE 109 (H) 03/01/2022   NA 141 03/01/2022   K 4.1 03/01/2022   CL 109 03/01/2022   CO2 27 03/01/2022   BUN 17 03/01/2022   CREATININE 0.86 03/01/2022   GFRNONAA >60 03/01/2022   CALCIUM 8.6 (L) 03/01/2022   PROT 7.3 12/14/2021   ALBUMIN 3.9 12/14/2021   LABGLOB 2.4 12/05/2021   AGRATIO 2.0 12/05/2021   BILITOT 1.0 12/14/2021   ALKPHOS 55 12/14/2021   AST 21 12/14/2021   ALT 28 12/14/2021   ANIONGAP 5 03/01/2022   Last lipids Lab Results  Component Value Date   CHOL 105 02/15/2022   HDL 31 (L) 02/15/2022   LDLCALC 58 02/15/2022   TRIG 78 02/15/2022   CHOLHDL 3.4 02/15/2022   Last hemoglobin A1c Lab Results  Component Value Date   HGBA1C 6.1 (H) 12/05/2021      The ASCVD Risk score (Arnett DK, et al., 2019) failed to calculate for the following reasons:    The patient has a prior MI or stroke diagnosis    Assessment & Plan:   1. Hospital discharge follow-up - He was encouraged to continue his hospital discharge instructions and to go to the ED for worsening symptoms.  2. Sore throat - No erythema, exudate or swelling observed, was advised to take Lozenges, test for Covid . He was advised to notify clinic for worsening symptoms. - benzocaine-menthol (SORE THROAT LOZENGES) 6-10 MG lozenge; Take 1 lozenge by mouth as needed for sore throat.  Dispense: 100 tablet; Refill: 0   Return in about 7 weeks (around 04/24/2022), or if symptoms worsen or fail to improve.    Hampton Cost Trellis Paganini, NP

## 2022-03-19 ENCOUNTER — Other Ambulatory Visit: Payer: Self-pay

## 2022-03-19 ENCOUNTER — Encounter: Payer: Self-pay | Admitting: Gerontology

## 2022-03-19 ENCOUNTER — Ambulatory Visit: Payer: Self-pay | Admitting: Gerontology

## 2022-03-19 VITALS — BP 129/84 | HR 65 | Temp 97.7°F | Resp 17 | Ht 69.0 in | Wt 239.4 lb

## 2022-03-19 DIAGNOSIS — M5116 Intervertebral disc disorders with radiculopathy, lumbar region: Secondary | ICD-10-CM

## 2022-03-19 DIAGNOSIS — J029 Acute pharyngitis, unspecified: Secondary | ICD-10-CM

## 2022-03-19 MED ORDER — GABAPENTIN 100 MG PO CAPS: 100.0000 mg | ORAL_CAPSULE | Freq: Three times a day (TID) | ORAL | 0 refills | Status: DC

## 2022-03-19 NOTE — Progress Notes (Signed)
I authorize this note.  Established Patient Office Visit  Subjective   Patient ID: JAEGER TRUEHEART, male    DOB: 12/06/1965  Age: 56 y.o. MRN: [id number]  Chief Complaint  Patient presents with   Follow-up    HPI  Brian Porter is a 56 y/o male who has a history of CAD, CHF, GERD, Hypertension, MI, who presents for a routine follow up visit. He had a clinic visit on 03/06/22 for symptoms of sore throat for two weeks. He states that his symptoms of sore throat have improved and he currently does not have a sore throat at all. He was started on Ranexa for angina after his last ED visit on 02/28/22, and he states that this medication helps a lot with his angina pain and breathing. He has an appointment with cardiology on 03/26/22. He is tolerating his medications and taking them as ordered. His mood is good and he denies suicidal and homicidal ideation. Overall, he states that he is doing well and has no further complaints.  Review of Systems  Constitutional: Negative.   HENT: Negative.    Eyes: Negative.   Respiratory: Negative.    Cardiovascular: Negative.   Gastrointestinal: Negative.   Genitourinary: Negative.   Musculoskeletal: Negative.   Neurological: Negative.   Psychiatric/Behavioral: Negative.        Objective:     BP 129/84 (BP Location: Right Arm, Patient Position: Sitting, Cuff Size: Large)   Pulse 65   Temp 97.7 F (36.5 C) (Oral)   Resp 17   Ht 5\' 9"  (1.753 m)   Wt 239 lb 6.4 oz (108.6 kg)   SpO2 96%   BMI 35.35 kg/m  BP Readings from Last 3 Encounters:  03/19/22 129/84  03/06/22 131/82  03/01/22 120/74   Wt Readings from Last 3 Encounters:  03/19/22 239 lb 6.4 oz (108.6 kg)  03/06/22 238 lb 14.4 oz (108.4 kg)  02/28/22 229 lb 4.5 oz (104 kg)      Physical Exam Constitutional:      Appearance: Normal appearance.  HENT:     Head: Normocephalic and atraumatic.  Eyes:     Conjunctiva/sclera: Conjunctivae normal.     Pupils: Pupils are equal, round, and  reactive to light.  Cardiovascular:     Rate and Rhythm: Normal rate and regular rhythm.     Heart sounds: Normal heart sounds.  Pulmonary:     Effort: Pulmonary effort is normal.     Breath sounds: Normal breath sounds.  Neurological:     General: No focal deficit present.     Mental Status: He is alert and oriented to person, place, and time. Mental status is at baseline.  Psychiatric:        Mood and Affect: Mood normal.        Behavior: Behavior normal.        Thought Content: Thought content normal.        Judgment: Judgment normal.      No results found for any visits on 03/19/22.  Last CBC Lab Results  Component Value Date   WBC 10.7 (H) 03/01/2022   HGB 13.6 03/01/2022   HCT 41.7 03/01/2022   MCV 95.9 03/01/2022   MCH 31.3 03/01/2022   RDW 12.9 03/01/2022   PLT 382 03/01/2022   Last metabolic panel Lab Results  Component Value Date   GLUCOSE 109 (H) 03/01/2022   NA 141 03/01/2022   K 4.1 03/01/2022   CL 109 03/01/2022   CO2 27  03/01/2022   BUN 17 03/01/2022   CREATININE 0.86 03/01/2022   GFRNONAA >60 03/01/2022   CALCIUM 8.6 (L) 03/01/2022   PROT 7.3 12/14/2021   ALBUMIN 3.9 12/14/2021   LABGLOB 2.4 12/05/2021   AGRATIO 2.0 12/05/2021   BILITOT 1.0 12/14/2021   ALKPHOS 55 12/14/2021   AST 21 12/14/2021   ALT 28 12/14/2021   ANIONGAP 5 03/01/2022      The ASCVD Risk score (Arnett DK, et al., 2019) failed to calculate for the following reasons:   The patient has a prior MI or stroke diagnosis    Assessment & Plan:    Lumbar disc disease with radiculopathy - His gabapentin will be refilled to assist with chronic pain related to his lumbar disc disease. - gabapentin (Neurontin) 100 mg capsule, Take 100 mg by mouth 3 (three) times daily. Dispense: 90; Refill: 0  Sore throat - Supportive therapy such as hydration and lozenges encouraged since symptoms of sore throat have improved.    Return in about 12 weeks (around 06/11/2022), or if symptoms  worsen or fail to improve.    Odelia Gage

## 2022-03-19 NOTE — Patient Instructions (Signed)

## 2022-04-03 ENCOUNTER — Ambulatory Visit: Payer: Self-pay | Admitting: Gerontology

## 2022-04-09 ENCOUNTER — Other Ambulatory Visit: Payer: Self-pay | Admitting: Family

## 2022-04-09 MED ORDER — POTASSIUM CHLORIDE CRYS ER 10 MEQ PO TBCR
10.0000 meq | EXTENDED_RELEASE_TABLET | Freq: Every day | ORAL | 3 refills | Status: DC
Start: 2022-04-09 — End: 2023-05-01

## 2022-05-31 ENCOUNTER — Other Ambulatory Visit: Payer: Self-pay | Admitting: Gerontology

## 2022-05-31 DIAGNOSIS — M5116 Intervertebral disc disorders with radiculopathy, lumbar region: Secondary | ICD-10-CM

## 2022-06-11 ENCOUNTER — Encounter: Payer: Self-pay | Admitting: Gerontology

## 2022-06-11 ENCOUNTER — Ambulatory Visit: Payer: Self-pay | Admitting: Gerontology

## 2022-06-11 VITALS — BP 137/83 | HR 71 | Temp 97.7°F | Wt 245.0 lb

## 2022-06-11 DIAGNOSIS — I5022 Chronic systolic (congestive) heart failure: Secondary | ICD-10-CM

## 2022-06-11 DIAGNOSIS — I10 Essential (primary) hypertension: Secondary | ICD-10-CM

## 2022-06-11 MED ORDER — SPIRONOLACTONE 25 MG PO TABS: 25.0000 mg | ORAL_TABLET | Freq: Every day | ORAL | 3 refills | Status: AC

## 2022-06-11 NOTE — Progress Notes (Signed)
Established Patient Office Visit  Subjective   Patient ID: Brian Porter, male    DOB: [birth year]  Age: 56 y.o. MRN: [id number]  No chief complaint on file.   HPI  Brian Porter is a 56y/o male who has a history of CAD, CHF, GERD, Hypertension, MI, who presents for a routine follow up visit.  He states that he's compliant with his medications, denies side effects and continues to make healthy lifestyle changes. He was seen at the [facility] clinic on 03/26/22 by Ladean Raya PA-C for hospital discharge follow up, will continue aspirin, Ranexa, high intensity statin, and will follow up on 06/25/22. Overall, he states that he's doing well and offers no further complaint.  Review of Systems  Constitutional: Negative.   Respiratory: Negative.    Cardiovascular: Negative.   Neurological: Negative.   Psychiatric/Behavioral: Negative.        Objective:     BP 137/83 (BP Location: Left Arm, Patient Position: Sitting, Cuff Size: Large)   Pulse 71   Temp 97.7 F (36.5 C)   Wt 245 lb (111.1 kg)   BMI 36.18 kg/m  BP Readings from Last 3 Encounters:  06/11/22 137/83  03/19/22 129/84  03/06/22 131/82   Wt Readings from Last 3 Encounters:  06/11/22 245 lb (111.1 kg)  03/19/22 239 lb 6.4 oz (108.6 kg)  03/06/22 238 lb 14.4 oz (108.4 kg)      Physical Exam HENT:     Head: Normocephalic and atraumatic.     Mouth/Throat:     Mouth: Mucous membranes are moist.  Eyes:     Pupils: Pupils are equal, round, and reactive to light.  Cardiovascular:     Rate and Rhythm: Normal rate and regular rhythm.     Pulses: Normal pulses.     Heart sounds: Normal heart sounds.  Pulmonary:     Effort: Pulmonary effort is normal.     Breath sounds: Normal breath sounds.  Skin:    General: Skin is warm.  Neurological:     General: No focal deficit present.     Mental Status: He is alert and oriented to person, place, and time. Mental status is at baseline.  Psychiatric:        Mood and  Affect: Mood normal.        Behavior: Behavior normal.        Thought Content: Thought content normal.        Judgment: Judgment normal.      No results found for any visits on 06/11/22.  Last CBC Lab Results  Component Value Date   WBC 10.7 (H) 03/01/2022   HGB 13.6 03/01/2022   HCT 41.7 03/01/2022   MCV 95.9 03/01/2022   MCH 31.3 03/01/2022   RDW 12.9 03/01/2022   PLT 382 03/01/2022   Last metabolic panel Lab Results  Component Value Date   GLUCOSE 109 (H) 03/01/2022   NA 141 03/01/2022   K 4.1 03/01/2022   CL 109 03/01/2022   CO2 27 03/01/2022   BUN 17 03/01/2022   CREATININE 0.86 03/01/2022   GFRNONAA >60 03/01/2022   CALCIUM 8.6 (L) 03/01/2022   PROT 7.3 12/14/2021   ALBUMIN 3.9 12/14/2021   LABGLOB 2.4 12/05/2021   AGRATIO 2.0 12/05/2021   BILITOT 1.0 12/14/2021   ALKPHOS 55 12/14/2021   AST 21 12/14/2021   ALT 28 12/14/2021   ANIONGAP 5 03/01/2022   Last lipids Lab Results  Component Value Date   CHOL 105 02/15/2022  HDL 31 (L) 02/15/2022   LDLCALC 58 02/15/2022   TRIG 78 02/15/2022   CHOLHDL 3.4 02/15/2022   Last hemoglobin A1c Lab Results  Component Value Date   HGBA1C 6.1 (H) 12/05/2021   Last thyroid functions Lab Results  Component Value Date   TSH 1.486 10/04/2021      The ASCVD Risk score (Arnett DK, et al., 2019) failed to calculate for the following reasons:   The patient has a prior MI or stroke diagnosis    Assessment & Plan:   1. Chronic systolic congestive heart failure (HCC) - He didn't bring Ranexa and Spironolactone for reconciliation, was reordered. He will continue current medication, DASH diet and follow up with Cardiology as scheduled. - spironolactone (ALDACTONE) 25 MG tablet; Take 1 tablet (25 mg total) by mouth daily.  Dispense: 90 tablet; Refill: 3  2. Essential hypertension - His blood pressure is improving, will continue on current medication, DASH diet and exercise as tolerated.   Return in about 3 months  (around 09/10/2022), or if symptoms worsen or fail to improve.    Syliva Mee Trellis Paganini, NP

## 2022-06-11 NOTE — Patient Instructions (Signed)

## 2022-07-04 ENCOUNTER — Ambulatory Visit: Payer: Self-pay | Admitting: Family

## 2022-07-05 ENCOUNTER — Ambulatory Visit: Payer: Self-pay | Attending: Family | Admitting: Family

## 2022-07-05 ENCOUNTER — Encounter: Payer: Self-pay | Admitting: Family

## 2022-07-05 VITALS — BP 111/74 | HR 72 | Resp 18 | Wt 242.2 lb

## 2022-07-05 DIAGNOSIS — F17200 Nicotine dependence, unspecified, uncomplicated: Secondary | ICD-10-CM | POA: Insufficient documentation

## 2022-07-05 DIAGNOSIS — Z7984 Long term (current) use of oral hypoglycemic drugs: Secondary | ICD-10-CM | POA: Insufficient documentation

## 2022-07-05 DIAGNOSIS — I110 Hypertensive heart disease with heart failure: Secondary | ICD-10-CM | POA: Insufficient documentation

## 2022-07-05 DIAGNOSIS — I10 Essential (primary) hypertension: Secondary | ICD-10-CM

## 2022-07-05 DIAGNOSIS — Z7982 Long term (current) use of aspirin: Secondary | ICD-10-CM | POA: Insufficient documentation

## 2022-07-05 DIAGNOSIS — I2510 Atherosclerotic heart disease of native coronary artery without angina pectoris: Secondary | ICD-10-CM | POA: Insufficient documentation

## 2022-07-05 DIAGNOSIS — I5022 Chronic systolic (congestive) heart failure: Secondary | ICD-10-CM | POA: Insufficient documentation

## 2022-07-05 DIAGNOSIS — K219 Gastro-esophageal reflux disease without esophagitis: Secondary | ICD-10-CM | POA: Insufficient documentation

## 2022-07-05 DIAGNOSIS — I252 Old myocardial infarction: Secondary | ICD-10-CM | POA: Insufficient documentation

## 2022-07-05 DIAGNOSIS — G4730 Sleep apnea, unspecified: Secondary | ICD-10-CM | POA: Insufficient documentation

## 2022-07-05 NOTE — Patient Instructions (Addendum)
Continue weighing daily and call for an overnight weight gain of 3 pounds or more or a weekly weight gain of more than 5 pounds.   If you have voicemail, please make sure your mailbox is cleaned out so that we may leave a message and please make sure to listen to any voicemails.    Call Cardiology to schedule follow-up appointment [phone]

## 2022-07-05 NOTE — Progress Notes (Unsigned)
Patient ID: Brian Porter, male    DOB: [birth year], 57 y.o.   MRN: [id number]   Brian Porter is a 57 y/o male with a history of obstructive sleep apnea (without CPAP), CAD, HTN, GERD, MI, current tobacco use and chronic heart failure.   Echo done 02/15/22 showed an EF of 35-40% along with mild Brian. Echo report from 10/22/20 reviewed and showed an EF of 25-30% along with mild Brian/AR. Echo report from 05/24/2019 reviewed and showed an EF of 30-35% along with trivial TR/PR. Echo done 10/15/15 and showed an EF of 35% along with moderate Brian/TR and an elevated PA pressure of 48 mm Hg.   LHC done 02/15/22:    RPAV-2 lesion is 90% stenosed.   RPDA lesion is 75% stenosed.   2nd RPL lesion is 80% stenosed.   1st Mrg lesion is 90% stenosed.   1st Diag lesion is 90% stenosed.   2nd Diag lesion is 90% stenosed.   Mid LAD-1 lesion is 10% stenosed.   Mid LAD-2 lesion is 50% stenosed.   RPAV-1 lesion is 90% stenosed.   There is moderate to severe left ventricular systolic dysfunction.   The left ventricular ejection fraction is 25-35% by visual estimate.  1.  NSTEMI 2.  Three-vessel coronary artery disease 90% stenosis D1, 90% stenosis D2, 90% stenosis OM1, 90% stenosis RPAV 1 and 2, 75% stenosis RPDA, without obvious culprit vessel, not ideal for percutaneous revascularization or surgical revascularization 3.  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 Recommended treatment for cardiomyopathy.   Was in the ED 02/28/22 due to chest pain. Discharged the next day.   He presents today for a follow-up visit with a chief complaint of pedal edema. Describes this as chronic in nature. Has associated dizziness  & feet tingling after standing for long periods of time along with this. Denies any difficulty sleeping, abdominal distention, palpitations, chest pain,  shortness of breath, cough or fatigue.   Not weighing daily but does have scales.   Past Medical History:  Diagnosis Date   CAD (coronary artery disease)    CHF (congestive heart failure) (HCC)    GERD (gastroesophageal reflux disease)    Hypertension    MI, old    Sleep apnea    Past Surgical History:  Procedure Laterality Date   CARDIAC CATHETERIZATION Right 10/16/2015   Procedure: Left Heart Cath and Coronary Angiography;  Surgeon: Brian Nancy, MD;  Location: [facility] INVASIVE CV LAB;  Service: Cardiovascular;  Laterality: Right;   CORONARY ANGIOPLASTY WITH STENT PLACEMENT  approx 2 years ago   HERNIA REPAIR Left 11/29/2016   [facility]   LEFT HEART CATH AND CORONARY ANGIOGRAPHY N/A 02/15/2022   Procedure: LEFT HEART CATH AND CORONARY ANGIOGRAPHY;  Surgeon: Brian Millard, MD;  Location: [facility] INVASIVE CV LAB;  Service: Cardiovascular;  Laterality: N/A;  12:00 PM   PARTIAL NEPHRECTOMY Left    SPLENECTOMY     Family History  Problem Relation Age of Onset   Hypertension Mother    Other Mother        covid pneumonia   Congestive Heart Failure Mother    Stroke Father    Heart attack Father 26   Diabetes Father    Hypertension Father    Diabetes Mellitus II Brother    Alzheimer's disease Maternal Grandmother    Heart attack Maternal Grandfather  Cancer Paternal Grandmother    Stroke Paternal Grandfather    Social History   Tobacco Use   Smoking status: Former    Packs/day: 0.50    Types: Cigarettes    Quit date: 10/24/2020    Years since quitting: 1.6   Smokeless tobacco: Never  Substance Use Topics   Alcohol use: No    Alcohol/week: 0.0 standard drinks of alcohol   Allergies  Allergen Reactions   Entresto [Sacubitril-Valsartan] Cough   Jardiance [Empagliflozin] Diarrhea   Prior to Admission medications   Medication Sig Start Date End Date Taking? Authorizing Provider  acetaminophen (TYLENOL) 650 MG CR tablet Take 650 mg by mouth every 8 (eight) hours as needed  for pain.   Yes [provider]  aspirin 81 MG EC tablet Take 1 tablet (81 mg total) by mouth daily. 10/23/20  Yes Elgergawy, Silver Huguenin, MD  atorvastatin (LIPITOR) 80 MG tablet Take 1 tablet (80 mg total) by mouth daily. 02/16/22  Yes Wouk, Ailene Rud, MD  furosemide (LASIX) 40 MG tablet Take 1 tablet (40 mg total) by mouth daily. 08/08/21  Yes Nancylee Gaines A, FNP  gabapentin (NEURONTIN) 100 MG capsule TAKE 1 CAPSULE BY MOUTH THREE TIMES DAILY 06/04/22  Yes Iloabachie, Chioma E, NP  ibuprofen (ADVIL) 200 MG tablet Take 400 mg by mouth every 6 (six) hours as needed.   Yes [provider]  metoprolol succinate (TOPROL-XL) 25 MG 24 hr tablet Take 1 tablet by mouth once daily 12/03/21  Yes Frank Pilger A, FNP  potassium chloride (KLOR-CON M) 10 MEQ tablet Take 1 tablet (10 mEq total) by mouth daily. 04/09/22  Yes Jemari Hallum, Otila Kluver A, FNP  ranolazine (RANEXA) 500 MG 12 hr tablet Take 1 tablet (500 mg total) by mouth 2 (two) times daily. 03/01/22  Yes Amin, Jeanella Flattery, MD  spironolactone (ALDACTONE) 25 MG tablet Take 1 tablet (25 mg total) by mouth daily. 06/11/22  Yes Iloabachie, Chioma E, NP   Review of Systems  Constitutional:  Negative for appetite change and fatigue.  HENT:  Negative for congestion, postnasal drip and sore throat.   Eyes: Negative.   Respiratory:  Negative for cough, chest tightness and shortness of breath.   Cardiovascular:  Positive for leg swelling (minimal). Negative for chest pain and palpitations.  Gastrointestinal:  Negative for abdominal distention and abdominal pain.  Endocrine: Negative.   Genitourinary: Negative.   Musculoskeletal:  Positive for myalgias (right leg). Negative for back pain.  Skin: Negative.   Allergic/Immunologic: Negative.   Neurological:  Positive for dizziness (at times) and numbness (bottom of feet if stands for too long). Negative for light-headedness.  Hematological:  Negative for adenopathy. Does not bruise/bleed easily.   Psychiatric/Behavioral:  Negative for dysphoric mood and sleep disturbance (sleeping on 2 pillows). The patient is not nervous/anxious.    Vitals:   07/05/22 1408  BP: 111/74  Pulse: 72  Resp: 18  SpO2: 98%  Weight: 242 lb 4 oz (109.9 kg)   Wt Readings from Last 3 Encounters:  07/05/22 242 lb 4 oz (109.9 kg)  06/11/22 245 lb (111.1 kg)  03/19/22 239 lb 6.4 oz (108.6 kg)   Lab Results  Component Value Date   CREATININE 0.86 03/01/2022   CREATININE 0.79 02/28/2022   CREATININE 0.81 02/15/2022   Physical Exam Vitals and nursing note reviewed.  Constitutional:      Appearance: He is well-developed.  HENT:     Head: Normocephalic and atraumatic.  Neck:     Vascular: No  JVD.  Cardiovascular:     Rate and Rhythm: Normal rate and regular rhythm.  Pulmonary:     Effort: Pulmonary effort is normal.     Breath sounds: No wheezing or rales.  Abdominal:     General: There is no distension.     Palpations: Abdomen is soft.     Tenderness: There is no abdominal tenderness.  Musculoskeletal:        General: No tenderness.     Cervical back: Normal range of motion and neck supple.     Right lower leg: Edema (1+ pitting) present.     Left lower leg: Edema (1+ pitting) present.  Skin:    General: Skin is warm and dry.  Neurological:     Mental Status: He is alert and oriented to person, place, and time.  Psychiatric:        Behavior: Behavior normal.        Thought Content: Thought content normal.     Assessment & Plan:   1: Chronic heart failure with reduced ejection fraction- - NYHA class I - euvolemic in appearance  - not weighing daily. Reminded to call for an overnight weight gain of >2 pounds or a weekly weight gain of >5 pounds.  - weight stable since he was last here 6 months ago - currently on GDMT of  metoprolol & spironolactone - unable to tolerate entresto or jardiance; tolerated farxiga but wasn't approved for patient assistance and he can't afford to take  it - discuss trying losartan - has been adding a little salt and admits that he could be doing better with monitoring it; encouraged to not add any salt to his food - encouraged to wear compression socks daily with putting them on every morning and removing at bedtime - BNP 12/14/21 was 28.0  2: HTN- - BP 111/74 - saw provider at [facility] 06/11/22 - reviewed BMP from 03/01/22 which showed sodium 141, potassium 4.1, creatinine 0.86 and GFR >60  3: CAD- - saw cardiology Margarito Courser) 03/26/22 - atorvastatin 80mg    Medication bottles reviewed.   Return in 6 months, sooner if needed.

## 2022-07-06 ENCOUNTER — Encounter: Payer: Self-pay | Admitting: Family

## 2022-08-08 ENCOUNTER — Other Ambulatory Visit: Payer: Self-pay | Admitting: Family

## 2022-09-06 ENCOUNTER — Emergency Department: Payer: Self-pay

## 2022-09-06 ENCOUNTER — Other Ambulatory Visit: Payer: Self-pay

## 2022-09-06 ENCOUNTER — Emergency Department
Admission: EM | Admit: 2022-09-06 | Discharge: 2022-09-06 | Disposition: A | Discharge status: Home / Self Care | Payer: Self-pay | Attending: Emergency Medicine | Admitting: Emergency Medicine

## 2022-09-06 DIAGNOSIS — R0602 Shortness of breath: Secondary | ICD-10-CM | POA: Insufficient documentation

## 2022-09-06 DIAGNOSIS — R051 Acute cough: Secondary | ICD-10-CM | POA: Insufficient documentation

## 2022-09-06 DIAGNOSIS — Z20822 Contact with and (suspected) exposure to covid-19: Secondary | ICD-10-CM | POA: Insufficient documentation

## 2022-09-06 DIAGNOSIS — Z87891 Personal history of nicotine dependence: Secondary | ICD-10-CM | POA: Insufficient documentation

## 2022-09-06 DIAGNOSIS — D72829 Elevated white blood cell count, unspecified: Secondary | ICD-10-CM | POA: Insufficient documentation

## 2022-09-06 LAB — CBC
HCT: 43.8 % (ref 39.0–52.0)
Hemoglobin: 14.5 g/dL (ref 13.0–17.0)
MCH: 31.5 pg (ref 26.0–34.0)
MCHC: 33.1 g/dL (ref 30.0–36.0)
MCV: 95.2 fL (ref 80.0–100.0)
Platelets: 378 10*3/uL (ref 150–400)
RBC: 4.6 MIL/uL (ref 4.22–5.81)
RDW: 13 % (ref 11.5–15.5)
WBC: 15.4 10*3/uL — ABNORMAL HIGH (ref 4.0–10.5)
nRBC: 0 % (ref 0.0–0.2)

## 2022-09-06 LAB — BASIC METABOLIC PANEL
Anion gap: 10 (ref 5–15)
BUN: 14 mg/dL (ref 6–20)
CO2: 24 mmol/L (ref 22–32)
Calcium: 8.5 mg/dL — ABNORMAL LOW (ref 8.9–10.3)
Chloride: 105 mmol/L (ref 98–111)
Creatinine, Ser: 1.03 mg/dL (ref 0.61–1.24)
GFR, Estimated: >60 mL/min (ref >60)
Glucose, Bld: 151 mg/dL — ABNORMAL HIGH (ref 70–99)
Potassium: 3.7 mmol/L (ref 3.5–5.1)
Sodium: 139 mmol/L (ref 135–145)

## 2022-09-06 LAB — RESP PANEL BY RT-PCR (RSV, FLU A&B, COVID)  RVPGX2
Influenza A by PCR: NEGATIVE
Influenza B by PCR: NEGATIVE
Resp Syncytial Virus by PCR: NEGATIVE
SARS Coronavirus 2 by RT PCR: NEGATIVE

## 2022-09-06 LAB — TROPONIN I (HIGH SENSITIVITY)
Troponin I (High Sensitivity): 12 ng/L (ref <18)
Troponin I (High Sensitivity): 12 ng/L (ref <18)

## 2022-09-06 MED ORDER — AZITHROMYCIN 250 MG PO TABS: ORAL_TABLET | ORAL | 0 refills | Status: AC

## 2022-09-06 MED ORDER — PREDNISONE 10 MG (21) PO TBPK: ORAL_TABLET | ORAL | 0 refills | Status: DC

## 2022-09-06 MED ORDER — METHYLPREDNISOLONE SODIUM SUCC 125 MG IJ SOLR
125.0000 mg | Freq: Once | INTRAMUSCULAR | Status: AC
  Administered 2022-09-06: 125 mg via INTRAVENOUS
  Filled 2022-09-06: qty 2

## 2022-09-06 MED ORDER — ALBUTEROL SULFATE HFA 108 (90 BASE) MCG/ACT IN AERS: 2.0000 | INHALATION_SPRAY | Freq: Four times a day (QID) | RESPIRATORY_TRACT | 2 refills | Status: DC | PRN

## 2022-09-06 MED ORDER — IPRATROPIUM-ALBUTEROL 0.5-2.5 (3) MG/3ML IN SOLN
3.0000 mL | Freq: Once | RESPIRATORY_TRACT | Status: AC
  Administered 2022-09-06: 3 mL via RESPIRATORY_TRACT
  Filled 2022-09-06: qty 3

## 2022-09-06 NOTE — ED Provider Notes (Signed)
[facility] Provider Note    Event Date/Time   First MD Initiated Contact with Patient 09/06/22 1759     (approximate)   History   Chest Pain and Cough   HPI  Brian Porter is a 57 y.o. male who presents to the urgency department today because of concerns for chest pain and cough.  Symptoms started yesterday.  They have persisted throughout the night and throughout today.  Pain is located primarily in the right side of his chest.  The patient denies any known sick contacts.  Denies any fevers although has had some chills.  Denies history of lung disease.     Physical Exam   Triage Vital Signs: ED Triage Vitals  Enc Vitals Group     BP 09/06/22 1725 125/75     Pulse Rate 09/06/22 1725 92     Resp 09/06/22 1725 18     Temp 09/06/22 1725 98 F (36.7 C)     Temp src --      SpO2 09/06/22 1725 95 %     Weight 09/06/22 1750 242 lb 4.6 oz (109.9 kg)     Height 09/06/22 1750 '5\' 9"'$  (1.753 m)     Head Circumference --      Peak Flow --      Pain Score 09/06/22 1712 5     Pain Loc --      Pain Edu? --      Excl. in [place]? --     Most recent vital signs: Vitals:   09/06/22 1725  BP: 125/75  Pulse: 92  Resp: 18  Temp: 98 F (36.7 C)  SpO2: 95%   General: Awake, alert, oriented. CV:  Good peripheral perfusion. Regular rate and rhythm. Resp:  Normal effort. Lungs clear. Abd:  No distention.    ED Results / Procedures / Treatments   Labs (all labs ordered are listed, but only abnormal results are displayed) Labs Reviewed  BASIC METABOLIC PANEL - Abnormal; Notable for the following components:      Result Value   Glucose, Bld 151 (*)    Calcium 8.5 (*)    All other components within normal limits  CBC - Abnormal; Notable for the following components:   WBC 15.4 (*)    All other components within normal limits  RESP PANEL BY RT-PCR (RSV, FLU A&B, COVID)  [id number]  TROPONIN I (HIGH SENSITIVITY)  TROPONIN I (HIGH SENSITIVITY)     EKG  I,  Nance Pear, attending physician, personally viewed and interpreted this EKG  EKG Time: 1722 Rate: 89 Rhythm: normal sinus rhythm Axis: normal Intervals: qtc 491 QRS: narrow, q waves v1, v2 ST changes: no st elevation Impression: abnormal ekg    RADIOLOGY I independently interpreted and visualized the CXR. My interpretation: No pneumonia Radiology interpretation:  IMPRESSION:  1. No acute findings.  2. Emphysema with chronic interstitial coarsening and bronchial  thickening.      PROCEDURES:  Critical Care performed: No    MEDICATIONS ORDERED IN ED: Medications - No data to display   IMPRESSION / MDM / [place] / ED COURSE  I reviewed the triage vital signs and the nursing notes.                              Differential diagnosis includes, but is not limited to, COPD, PNA, PE  Patient's presentation is most consistent with acute presentation with potential  threat to life or bodily function.   The patient is on the cardiac monitor to evaluate for evidence of arrhythmia and/or significant heart rate changes.  Patient presented to the emergency department today because of concerns for shortness of breath and cough.  On exam patient in no respiratory distress.  No hypoxia.  X-ray without any pneumonia although is concerning for possible emphysema.  Patient states he was a smoker although quit 2 years ago.  Blood work without concerning anemia or electrolyte abnormality.  Slight leukocytosis.  I did discuss these findings with the patient.  At this time I think likely COPD exacerbation.  Patient did feel better after breathing treatments here in the emergency department.  Given improvement I think it is reasonable for patient to be discharged home.  Will plan on discharging with further steroids, short course of antibiotics and albuterol inhaler.  Encourage patient to follow-up with his primary care doctor for further workup and evaluation.    FINAL  CLINICAL IMPRESSION(S) / ED DIAGNOSES   Final diagnoses:  Acute cough  Shortness of breath        Note:  This document was prepared using Dragon voice recognition software and may include unintentional dictation errors.    Nance Pear, MD 09/06/22 2133

## 2022-09-06 NOTE — Discharge Instructions (Signed)
Please seek medical attention for any high fevers, chest pain, shortness of breath, change in behavior, persistent vomiting, bloody stool or any other new or concerning symptoms.  

## 2022-09-06 NOTE — ED Notes (Signed)
EDT attempted blood draw x2 without success.

## 2022-09-06 NOTE — ED Triage Notes (Signed)
Pt to Ed via POV from home. Pt reports cough and sore throat that started yesterday and is now having CP. Pt with hx CHF, CAD and HTN

## 2022-09-12 ENCOUNTER — Ambulatory Visit: Payer: Self-pay | Admitting: Adult Health

## 2022-09-12 ENCOUNTER — Encounter: Payer: Self-pay | Admitting: Gerontology

## 2022-09-12 VITALS — BP 144/88 | HR 68 | Temp 98.4°F | Wt 245.1 lb

## 2022-09-12 DIAGNOSIS — M79604 Pain in right leg: Secondary | ICD-10-CM

## 2022-09-12 DIAGNOSIS — J439 Emphysema, unspecified: Secondary | ICD-10-CM

## 2022-09-12 DIAGNOSIS — J069 Acute upper respiratory infection, unspecified: Secondary | ICD-10-CM

## 2022-09-12 MED ORDER — CYCLOBENZAPRINE HCL 10 MG PO TABS: 10.0000 mg | ORAL_TABLET | Freq: Every day | ORAL | 0 refills | Status: AC

## 2022-09-12 NOTE — Progress Notes (Signed)
Established Patient Office Visit  Subjective   Patient ID: Brian Porter, male    DOB: 1965-11-17  Age: 57 y.o. MRN: [id number]  Chief Complaint  Patient presents with  . Follow-up    HPI   Brian Porter is a 57y/o male who has a history of CAD, CHF, GERD, Hypertension, MI, who presents for a follow up s/p ED visit on 09/06/22 for cough and chest pain. He was tested for Covid, influenza and RSV which were all negative. Chest xray was done which showed emphysema with chronic interstitial coarsening and bronchial thickening; consistent with his 30-year smoking history. He currently does not smoke.  He reports feeling much better than he did last week. He reports persistent cough but it is better than before. States he completed his last dose of steriods today. Denies any shortness of breath or wheezing. Cough is non-productive. Patient also complains of bilateral leg pain especially at night. Reports moderate relief with walking. Pain is worse when he sits for long.  He states he has seen Dr. Jefm Bryant and was started on gabapentin. He has been taking gabapentin but stopped it because it constipates him. He states the flexeril has been helpful. He was prescribed flexeril by his cardiologist. He is requesting a refill of the flexeril. His blood pressure is mildly elevated today; which he attributes to being on steroids. His blood pressure has been blow 130/80 at home and during previous visit. He reports taking all medications as prescribed.  He denies any headaches, dizziness or blurry vision. He also states that he exercises by walking a couple of times a week for about 30 minutes. He has also been trying to eat more healthy.  Patient Active Problem List   Diagnosis Date Noted  . Sore throat 03/06/2022  . Chest pain 02/28/2022  . Peripheral neuropathy 02/28/2022  . NSTEMI (non-ST elevated myocardial infarction) ([place]) 02/14/2022  . Dyslipidemia 02/14/2022  . Essential hypertension 02/14/2022  .  Chronic systolic CHF (congestive heart failure) ([place]) 02/14/2022  . Elevated lipids 12/13/2021  . Light-headedness 09/26/2021  . Lumbar disc disease with radiculopathy 03/21/2021  . Encounter for follow-up 03/21/2021  . Muscle pain 02/22/2021  . Hospital discharge follow-up 11/16/2020  . PAD (peripheral artery disease) ([place]) 11/16/2020  . CVA (cerebral vascular accident) ([place]) 10/21/2020  . Prediabetes 10/05/2020  . Leg pain 10/05/2020  . CAP (community acquired pneumonia) 07/24/2018  . Bilateral recurrent inguinal hernia without obstruction or gangrene   . Acute on chronic systolic heart failure ([place]) 08/27/2016  . HTN (hypertension) 11/02/2015  . Tobacco abuse 11/02/2015  . Hand joint pain 11/02/2015   Past Medical History:  Diagnosis Date  . CAD (coronary artery disease)   . CHF (congestive heart failure) ([place])   . GERD (gastroesophageal reflux disease)   . Hypertension   . MI, old   . Sleep apnea    Past Surgical History:  Procedure Laterality Date  . CARDIAC CATHETERIZATION Right 10/16/2015   Procedure: Left Heart Cath and Coronary Angiography;  Surgeon: Dionisio Haylen, MD;  Location: [place] [facility];  Service: Cardiovascular;  Laterality: Right;  . CORONARY ANGIOPLASTY WITH STENT PLACEMENT  approx 2 years ago  . HERNIA REPAIR Left 11/29/2016   [facility]  . LEFT HEART CATH AND CORONARY ANGIOGRAPHY N/A 02/15/2022   Procedure: LEFT HEART CATH AND CORONARY ANGIOGRAPHY;  Surgeon: Isaias Cowman, MD;  Location: [place] CV [facility];  Service: Cardiovascular;  Laterality: N/A;  12:00 PM  . PARTIAL NEPHRECTOMY Left   .  SPLENECTOMY     Social History   Tobacco Use  . Smoking status: Former    Packs/day: .5    Types: Cigarettes    Quit date: 10/24/2020    Years since quitting: 1.8  . Smokeless tobacco: Never  Vaping Use  . Vaping Use: Never used  Substance Use Topics  . Alcohol use: No    Alcohol/week: 0.0 standard drinks of alcohol  . Drug use: Not Currently     Comment: percocet   Family History  Problem Relation Age of Onset  . Hypertension Mother   . Other Mother        covid pneumonia  . Congestive Heart Failure Mother   . Stroke Father   . Heart attack Father 73  . Diabetes Father   . Hypertension Father   . Diabetes Mellitus II Brother   . Alzheimer's disease Maternal Grandmother   . Heart attack Maternal Grandfather   . Cancer Paternal Grandmother   . Stroke Paternal Grandfather    Allergies  Allergen Reactions  . Entresto [Sacubitril-Valsartan] Cough  . Jardiance [Empagliflozin] Diarrhea      Review of Systems  Constitutional: Negative.   HENT: Negative.    Eyes: Negative.   Respiratory:  Positive for cough (improving with steroids and antibiotics). Negative for wheezing (was wheezing prior to going to the ED but symptoms have resolved).   Cardiovascular: Negative.   Gastrointestinal: Negative.   Genitourinary: Negative.   Musculoskeletal:  Positive for joint pain (complains of bilateral leg pain).  Neurological: Negative.   Psychiatric/Behavioral: Negative.        Objective:     BP (!) 144/88   Pulse 68   Temp 98.4 F (36.9 C)   Wt 245 lb 1.6 oz (111.2 kg)   SpO2 94%   BMI 36.19 kg/m  BP Readings from Last 3 Encounters:  09/12/22 (!) 144/88  09/06/22 (!) 148/99  07/05/22 111/74   Wt Readings from Last 3 Encounters:  09/12/22 245 lb 1.6 oz (111.2 kg)  09/06/22 242 lb 4.6 oz (109.9 kg)  07/05/22 242 lb 4 oz (109.9 kg)    Repeat BP 130/77  Physical Exam Constitutional:      Appearance: Normal appearance.  HENT:     Head: Normocephalic.     Nose: Nose normal.     Mouth/Throat:     Mouth: Mucous membranes are moist.     Pharynx: Oropharynx is clear.  Eyes:     Extraocular Movements: Extraocular movements intact.     Conjunctiva/sclera: Conjunctivae normal.     Pupils: Pupils are equal, round, and reactive to light.  Cardiovascular:     Rate and Rhythm: Normal rate and regular rhythm.   Pulmonary:     Effort: Pulmonary effort is normal.     Breath sounds: Normal breath sounds.  Abdominal:     General: Bowel sounds are normal.     Palpations: Abdomen is soft.  Musculoskeletal:        General: Normal range of motion.     Cervical back: Normal range of motion.  Skin:    General: Skin is warm and dry.     Capillary Refill: Capillary refill takes less than 2 seconds.  Neurological:     General: No focal deficit present.     Mental Status: He is alert and oriented to person, place, and time. Mental status is at baseline.  Psychiatric:        Mood and Affect: Mood normal.  Behavior: Behavior normal.        Thought Content: Thought content normal.        Judgment: Judgment normal.    No results found for any visits on 09/12/22.  Last CBC Lab Results  Component Value Date   WBC 15.4 (H) 09/06/2022   HGB 14.5 09/06/2022   HCT 43.8 09/06/2022   MCV 95.2 09/06/2022   MCH 31.5 09/06/2022   RDW 13.0 09/06/2022   PLT 378 [id number]   Last metabolic panel Lab Results  Component Value Date   GLUCOSE 151 (H) 09/06/2022   NA 139 09/06/2022   K 3.7 09/06/2022   CL 105 09/06/2022   CO2 24 09/06/2022   BUN 14 09/06/2022   CREATININE 1.03 09/06/2022   GFRNONAA >60 09/06/2022   CALCIUM 8.5 (L) 09/06/2022   PROT 7.3 12/14/2021   ALBUMIN 3.9 12/14/2021   LABGLOB 2.4 12/05/2021   AGRATIO 2.0 12/05/2021   BILITOT 1.0 12/14/2021   ALKPHOS 55 12/14/2021   AST 21 12/14/2021   ALT 28 12/14/2021   ANIONGAP 10 09/06/2022   Last lipids Lab Results  Component Value Date   CHOL 105 02/15/2022   HDL 31 (L) 02/15/2022   LDLCALC 58 02/15/2022   TRIG 78 02/15/2022   CHOLHDL 3.4 02/15/2022   Last hemoglobin A1c Lab Results  Component Value Date   HGBA1C 6.1 (H) 12/05/2021   Last thyroid functions Lab Results  Component Value Date   TSH 1.486 10/04/2021   Last vitamin D No results found for: "25OHVITD2", "25OHVITD3", "VD25OH" Last vitamin B12 and  Folate No results found for: "VITAMINB12", "FOLATE"    The ASCVD Risk score (Arnett DK, et al., 2019) failed to calculate for the following reasons:   The patient has a prior MI or stroke diagnosis    Assessment & Plan:  1. Upper respiratory tract infection, unspecified type Seen at the ED on 09/06/22 for cough and chest pain. Negative for covid, RSV and influenza. Chest xray showed emphysema. Completed course of steroid today. Already completed azithromycin. Continue OTC cough remedies, hydration with warms fluids and immune boosters like vitamin C and zinc.  Encourage to continue using his albuterol inhaler. Call back if dyspnea or wheezing develops. 2. Bilateral leg pain Patient continues to have bilateral leg pain and has seen Dr Jefm Bryant and was taking gabapentin which constipates him so he stopped taking it but the Flexeril was helpful. Will refill flexeril. Patient also educated and given given literature on leg exercises to help with the pain and circulation. Prescribed flexeril 10 mg at bedtime   3. Pulmonary emphysema, unspecified emphysema type ([place]) Chest xray done on 09/06/22 at ED showed emphysema with chronic interstitia coarsening and bronchial thickening. Patient has history of smoking (30 years) but quit 2 years. He will need pulmonary function tests to r/o COPD and further characterize the CXR findings. Will consider a CT chest if PFTs are abnormal. Will differ to pulmonology for lung cancer screening. Given [place] application for charity care and referral to pulmonologist. [facility] care application given today and will need a pulmonology appointment.    Return to Clinic in 3 months (around 12/12/22), or if symptoms worsen or fail to improve. Problem List Items Addressed This Visit   None URI Bilateral leg pain Emphysema  No follow-ups on file.    Sumner Boast, RN  Patient seen and examined with NP student Lorrene Reid, PE, and plan discussed with me and  reviewed with patient.   Magdalene S.  [facility] ANP-BC Pulmonary and Critical Care Medicine [facility] Pager [phone] or [phone]  NB: This document was prepared using Dragon voice recognition software and may include unintentional dictation errors.

## 2022-09-12 NOTE — Patient Instructions (Addendum)
Plantar flexion, standing  Stand with your feet shoulder-width apart. Place your hands on a wall or table to steady yourself as needed, but try not to use it for support. Rise up on your toes (plantar flexion). If this exercise is too easy, try these options: Shift your weight toward your left / right leg until you feel challenged. If told by your health care provider, stand on your left / right foot only. Hold this position for __________ seconds. Repeat __________ times. Complete this exercise __________ times a day.  Gastrocnemius stretch, standing This exercise is also called a calf stretch. It stretches the muscles in the back of the upper calf (gastrocnemius). Stand with your hands against a wall. Extend your left / right leg behind you, and bend your front knee slightly. If directed, place a folded washcloth under the arch of your foot for support. Keeping your heels on the floor, your toes facing forward, and your back knee straight, shift your weight toward the wall. Do not allow your back to arch. You should feel a gentle stretch in the upper calf. Hold this position for __________ seconds. Repeat __________ times. Complete this exercise __________ times a day.  Cooking With Less Salt Cooking with less salt is one way to reduce the amount of sodium you get from food. Sodium is one of the elements that make up salt. It is found naturally in foods and is also added to certain foods. Depending on your condition and overall health, your health care provider or dietitian may recommend that you reduce your sodium intake. Most people should have less than 2,300 milligrams (mg) of sodium each day. If you have high blood pressure (hypertension), you may need to limit your sodium to 1,500 mg each day. Follow the tips below to help reduce your sodium intake. What are tips for eating less sodium? Reading food labels  Check the food label before buying or using packaged ingredients. Always check  the label for the serving size and sodium content. Look for products with no more than 140 mg of sodium in one serving. Check the % Daily Value column to see what percent of the daily recommended amount of sodium is provided in one serving of the product. Foods with 5% or less in this column are considered low in sodium. Foods with 20% or higher are considered high in sodium. Do not choose foods with salt as one of the first three ingredients on the ingredients list. If salt is one of the first three ingredients, it usually means the item is high in sodium. Shopping Buy sodium-free or low-sodium products. Look for the following words on food labels: Low-sodium. Sodium-free. Reduced-sodium. No salt added. Unsalted. Always check the sodium content even if foods are labeled as low-sodium or no salt added. Buy fresh foods. Cooking Use herbs, seasonings without salt, and spices as substitutes for salt. Use sodium-free baking soda when baking. Grill, braise, or roast foods to add flavor with less salt. Avoid adding salt to pasta, rice, or hot cereals. Drain and rinse canned vegetables, beans, and meat before use. Avoid adding salt when cooking sweets and desserts. Cook with low-sodium ingredients. What foods are high in sodium? Vegetables Regular canned vegetables (not low-sodium or reduced-sodium). Sauerkraut, pickled vegetables, and relishes. Olives. [place] fries. Onion rings. Regular canned tomato sauce and paste. Regular tomato and vegetable juice. Frozen vegetables in sauces. Grains Instant hot cereals. Bread stuffing, pancake, and biscuit mixes. Croutons. Seasoned rice or pasta mixes. Noodle soup cups.  Boxed or frozen macaroni and cheese. Regular salted crackers. Self-rising flour. Rolls. Bagels. Flour tortillas and wraps. Meats and other proteins Meat or fish that is salted, canned, smoked, cured, spiced, or pickled. This includes bacon, ham, sausages, hot dogs, corned beef, chipped beef,  meat loaves, salt pork, jerky, pickled herring, anchovies, regular canned tuna, and sardines. Salted nuts. Dairy Processed cheese and cheese spreads. Cheese curds. Blue cheese. Feta cheese. String cheese. Regular cottage cheese. Buttermilk. Canned milk. The items listed above may not be a complete list of foods high in sodium. Actual amounts of sodium may be different depending on processing. Contact a dietitian for more information. What foods are low in sodium? Fruits Fresh, frozen, or canned fruit with no sauce added. Fruit juice. Vegetables Fresh or frozen vegetables with no sauce added. "No salt added" canned vegetables. "No salt added" tomato sauce and paste. Low-sodium or reduced-sodium tomato and vegetable juice. Grains Noodles, pasta, quinoa, rice. Shredded or puffed wheat or puffed rice. Regular or quick oats (not instant). Low-sodium crackers. Low-sodium bread. Whole-grain bread and whole-grain pasta. Unsalted popcorn. Meats and other proteins Fresh or frozen whole meats, poultry (not injected with sodium), and fish with no sauce added. Unsalted nuts. Dried peas, beans, and lentils without added salt. Unsalted canned beans. Eggs. Unsalted nut butters. Low-sodium canned tuna or chicken. Dairy Milk. Soy milk. Yogurt. Low-sodium cheeses, such as Swiss, Monterey Jack, [place], and [employer]. Sherbet or ice cream (keep to  cup per serving). Cream cheese. Fats and oils Unsalted butter or margarine. Other foods Homemade pudding. Sodium-free baking soda and baking powder. Herbs and spices. Low-sodium seasoning mixes. Beverages Coffee and tea. Carbonated beverages. The items listed above may not be a complete list of foods low in sodium. Actual amounts of sodium may be different depending on processing. Contact a dietitian for more information. What are some salt alternatives when cooking? The following are herbs, seasonings, and spices that can be used instead of salt to flavor your food.  Herbs should be fresh or dried. Do not choose packaged mixes. Next to the name of the herb, spice, or seasoning are some examples of foods you can pair it with. Herbs [place] leaves - Soups, meat and vegetable dishes, and spaghetti sauce. Basil - [employer], soups, pasta, and fish dishes. Cilantro - Meat, poultry, and vegetable dishes. Chili powder - Marinades and Mexican dishes. Chives - Salad dressings and potato dishes. Cumin - Mexican dishes, couscous, and meat dishes. Dill - Fish dishes, sauces, and salads. Fennel - Meat and vegetable dishes, breads, and cookies. Garlic (do not use garlic salt) - [place] dishes, meat dishes, salad dressings, and sauces. Marjoram - Soups, potato dishes, and meat dishes. Oregano - Pizza and spaghetti sauce. Parsley - Salads, soups, pasta, and meat dishes. Rosemary - [place] dishes, salad dressings, soups, and red meats.cument Reviewed: 01/23/2021 Elsevier Patient Education  [place] With Less [employer] with less salt is one way to reduce the amount of sodium you get from food. Sodium is one of the elements that make up salt. It is found naturally in foods and is also added to certain foods. Depending on your condition and overall health, your health care provider or dietitian may recommend that you reduce your sodium intake. Most people should have less than 2,300 milligrams (mg) of sodium each day. If you have high blood pressure (hypertension), you may need to limit your sodium to 1,500 mg each day. Follow the tips below to help reduce your sodium  intake. What are tips for eating less sodium? Reading food labels  Check the food label before buying or using packaged ingredients. Always check the label for the serving size and sodium content. Look for products with no more than 140 mg of sodium in one serving. Check the % Daily Value column to see what percent of the daily recommended amount of sodium is provided in one serving of the  product. Foods with 5% or less in this column are considered low in sodium. Foods with 20% or higher are considered high in sodium. Do not choose foods with salt as one of the first three ingredients on the ingredients list. If salt is one of the first three ingredients, it usually means the item is high in sodium. Shopping Buy sodium-free or low-sodium products. Look for the following words on food labels: Low-sodium. Sodium-free. Reduced-sodium. No salt added. Unsalted. Always check the sodium content even if foods are labeled as low-sodium or no salt added. Buy fresh foods. Cooking Use herbs, seasonings without salt, and spices as substitutes for salt. Use sodium-free baking soda when baking. Grill, braise, or roast foods to add flavor with less salt. Avoid adding salt to pasta, rice, or hot cereals. Drain and rinse canned vegetables, beans, and meat before use. Avoid adding salt when cooking sweets and desserts. Cook with low-sodium ingredients. What foods are high in sodium? Vegetables Regular canned vegetables (not low-sodium or reduced-sodium). Sauerkraut, pickled vegetables, and relishes. Olives. [place] fries. Onion rings. Regular canned tomato sauce and paste. Regular tomato and vegetable juice. Frozen vegetables in sauces. Grains Instant hot cereals. Bread stuffing, pancake, and biscuit mixes. Croutons. Seasoned rice or pasta mixes. Noodle soup cups. Boxed or frozen macaroni and cheese. Regular salted crackers. Self-rising flour. Rolls. Bagels. Flour tortillas and wraps. Meats and other proteins Meat or fish that is salted, canned, smoked, cured, spiced, or pickled. This includes bacon, ham, sausages, hot dogs, corned beef, chipped beef, meat loaves, salt pork, jerky, pickled herring, anchovies, regular canned tuna, and sardines. Salted nuts. Dairy Processed cheese and cheese spreads. Cheese curds. Blue cheese. Feta cheese. String cheese. Regular cottage cheese. Buttermilk.  Canned milk. The items listed above may not be a complete list of foods high in sodium. Actual amounts of sodium may be different depending on processing. Contact a dietitian for more information. What foods are low in sodium? Fruits Fresh, frozen, or canned fruit with no sauce added. Fruit juice. Vegetables Fresh or frozen vegetables with no sauce added. "No salt added" canned vegetables. "No salt added" tomato sauce and paste. Low-sodium or reduced-sodium tomato and vegetable juice. Grains Noodles, pasta, quinoa, rice. Shredded or puffed wheat or puffed rice. Regular or quick oats (not instant). Low-sodium crackers. Low-sodium bread. Whole-grain bread and whole-grain pasta. Unsalted popcorn. Meats and other proteins Fresh or frozen whole meats, poultry (not injected with sodium), and fish with no sauce added. Unsalted nuts. Dried peas, beans, and lentils without added salt. Unsalted canned beans. Eggs. Unsalted nut butters. Low-sodium canned tuna or chicken. Dairy Milk. Soy milk. Yogurt. Low-sodium cheeses, such as Swiss, Monterey Jack, [place], and [employer]. Sherbet or ice cream (keep to  cup per serving). Cream cheese. Fats and oils Unsalted butter or margarine. Other foods Homemade pudding. Sodium-free baking soda and baking powder. Herbs and spices. Low-sodium seasoning mixes. Beverages Coffee and tea. Carbonated beverages. The items listed above may not be a complete list of foods low in sodium. Actual amounts of sodium may be different depending on processing. Contact a  dietitian for more information. What are some salt alternatives when cooking? The following are herbs, seasonings, and spices that can be used instead of salt to flavor your food. Herbs should be fresh or dried. Do not choose packaged mixes. Next to the name of the herb, spice, or seasoning are some examples of foods you can pair it with. Herbs [place] leaves - Soups, meat and vegetable dishes, and spaghetti  sauce. Basil - [employer], soups, pasta, and fish dishes. Cilantro - Meat, poultry, and vegetable dishes. Chili powder - Marinades and Mexican dishes. Chives - Salad dressings and potato dishes. Cumin - Mexican dishes, couscous, and meat dishes. Dill - Fish dishes, sauces, and salads. Fennel - Meat and vegetable dishes, breads, and cookies. Garlic (do not use garlic salt) - [place] dishes, meat dishes, salad dressings, and sauces. Marjoram - Soups, potato dishes, and meat dishes. Oregano - Pizza and spaghetti sauce. Parsley - Salads, soups, pasta, and meat dishes. Rosemary - [place] dishes, salad dressings, soups, and red meats.

## 2022-10-23 ENCOUNTER — Other Ambulatory Visit: Payer: Self-pay | Admitting: Gerontology

## 2022-10-23 DIAGNOSIS — M5116 Intervertebral disc disorders with radiculopathy, lumbar region: Secondary | ICD-10-CM

## 2022-10-24 ENCOUNTER — Other Ambulatory Visit: Payer: Self-pay

## 2022-10-24 ENCOUNTER — Ambulatory Visit: Payer: Self-pay | Admitting: Gerontology

## 2022-10-24 ENCOUNTER — Encounter: Payer: Self-pay | Admitting: Gerontology

## 2022-10-24 VITALS — BP 137/82 | HR 78 | Temp 98.1°F | Resp 18 | Ht 70.0 in | Wt 251.0 lb

## 2022-10-24 DIAGNOSIS — M5116 Intervertebral disc disorders with radiculopathy, lumbar region: Secondary | ICD-10-CM

## 2022-10-24 MED ORDER — GABAPENTIN 100 MG PO CAPS: 100.0000 mg | ORAL_CAPSULE | Freq: Three times a day (TID) | ORAL | 0 refills | Status: DC

## 2022-10-24 NOTE — Progress Notes (Signed)
Established Patient Office Visit  Subjective   Patient ID: Brian Porter, male    DOB: Mar 02, 1966  Age: 57 y.o. MRN: [id number]  Chief Complaint  Patient presents with   Follow-up    Requesting refill on Gabapentin. Patient will have active [employer] insurance effective 10/30/22.    HPI Brian Porter is a 56y/o male who has a history of CAD, CHF, GERD, Hypertension, MI, who presents today with a chief complaint of right lower back pain. Patient reports that the pain has been going on for months and it has been on and off. Patient describes it as a shooting pain that radiates down both of his legs and feeling numb sometimes. He states rest makes the pain better and taking gabapentin relieved symptoms in the past.  The pain is worst after prolonged standing and walking and he feels dizzy sometimes.   He rates his pain as  5/10. He denies muscle nor motor weakness, bowel/bladder incontinence and saddle anesthesia. He reports that the gabapentin provides him some relief. Overall, he states that he's doing well and offers no further complaint.   Patient Active Problem List   Diagnosis Date Noted   Sore throat 03/06/2022   Chest pain 02/28/2022   Peripheral neuropathy 02/28/2022   NSTEMI (non-ST elevated myocardial infarction) 02/14/2022   Dyslipidemia 02/14/2022   Essential hypertension 02/14/2022   Chronic systolic CHF (congestive heart failure) 02/14/2022   Elevated lipids 12/13/2021   Light-headedness 09/26/2021   Lumbar disc disease with radiculopathy 03/21/2021   Encounter for follow-up 03/21/2021   Muscle pain 02/22/2021   Hospital discharge follow-up 11/16/2020   PAD (peripheral artery disease) 11/16/2020   CVA (cerebral vascular accident) 10/21/2020   Prediabetes 10/05/2020   Leg pain 10/05/2020   CAP (community acquired pneumonia) 07/24/2018   Bilateral recurrent inguinal hernia without obstruction or gangrene    Acute on chronic systolic heart failure 08/27/2016    HTN (hypertension) 11/02/2015   Tobacco abuse 11/02/2015   Hand joint pain 11/02/2015   Past Medical History:  Diagnosis Date   CAD (coronary artery disease)    CHF (congestive heart failure)    GERD (gastroesophageal reflux disease)    Hypertension    MI, old    Sleep apnea    Past Surgical History:  Procedure Laterality Date   CARDIAC CATHETERIZATION Right 10/16/2015   Procedure: Left Heart Cath and Coronary Angiography;  Surgeon: Laurier Nancy, MD;  Location: [facility];  Service: Cardiovascular;  Laterality: Right;   CORONARY ANGIOPLASTY WITH STENT PLACEMENT  approx 2 years ago   HERNIA REPAIR Left 11/29/2016   [facility]   LEFT HEART CATH AND CORONARY ANGIOGRAPHY N/A 02/15/2022   Procedure: LEFT HEART CATH AND CORONARY ANGIOGRAPHY;  Surgeon: Marcina Millard, MD;  Location: [facility];  Service: Cardiovascular;  Laterality: N/A;  12:00 PM   PARTIAL NEPHRECTOMY Left    SPLENECTOMY     Social History   Tobacco Use   Smoking status: Former    Packs/day: .5    Types: Cigarettes    Quit date: 10/24/2020    Years since quitting: 2.0   Smokeless tobacco: Never  Vaping Use   Vaping Use: Never used  Substance Use Topics   Alcohol use: No    Alcohol/week: 0.0 standard drinks of alcohol   Drug use: Not Currently    Comment: percocet   Family History  Problem Relation Age of Onset   Hypertension Mother    Other Mother  covid pneumonia   Congestive Heart Failure Mother    Stroke Father    Heart attack Father 31   Diabetes Father    Hypertension Father    Diabetes Mellitus II Brother    Alzheimer's disease Maternal Grandmother    Heart attack Maternal Grandfather    Cancer Paternal Grandmother    Stroke Paternal Grandfather    Allergies  Allergen Reactions   Entresto [Sacubitril-Valsartan] Cough   Jardiance [Empagliflozin] Diarrhea      Review of Systems  Constitutional: Negative.   HENT: Negative.    Eyes: Negative.   Respiratory:  Negative.    Cardiovascular: Negative.   Gastrointestinal: Negative.   Genitourinary: Negative.   Musculoskeletal:  Positive for back pain (rt lower back pain).  Skin: Negative.   Neurological:  Positive for dizziness.  Psychiatric/Behavioral: Negative.        Objective:     BP 137/82 (BP Location: Right Arm, Patient Position: Sitting, Cuff Size: Large)   Pulse 78   Temp 98.1 F (36.7 C) (Oral)   Resp 18   Ht 5\' 10"  (1.778 m)   Wt 251 lb (113.9 kg)   SpO2 92%   BMI 36.01 kg/m  BP Readings from Last 3 Encounters:  10/24/22 137/82  09/12/22 (!) 144/88  09/06/22 (!) 148/99   Wt Readings from Last 3 Encounters:  10/24/22 251 lb (113.9 kg)  09/12/22 245 lb 1.6 oz (111.2 kg)  09/06/22 242 lb 4.6 oz (109.9 kg)   SpO2 Readings from Last 3 Encounters:  10/24/22 92%  09/12/22 94%  09/06/22 90%      Physical Exam Constitutional:      Appearance: He is obese.  HENT:     Head: Normocephalic and atraumatic.     Right Ear: External ear normal.     Left Ear: External ear normal.     Mouth/Throat:     Mouth: Mucous membranes are moist.     Pharynx: Oropharynx is clear.  Eyes:     Extraocular Movements: Extraocular movements intact.     Conjunctiva/sclera: Conjunctivae normal.     Pupils: Pupils are equal, round, and reactive to light.  Cardiovascular:     Rate and Rhythm: Normal rate and regular rhythm.     Pulses: Normal pulses.     Heart sounds: Normal heart sounds.  Pulmonary:     Effort: Pulmonary effort is normal.     Breath sounds: Normal breath sounds.  Abdominal:     General: Bowel sounds are normal.     Palpations: Abdomen is soft.  Musculoskeletal:        General: Normal range of motion.     Cervical back: Normal range of motion.  Skin:    General: Skin is warm and dry.     Capillary Refill: Capillary refill takes less than 2 seconds.  Neurological:     General: No focal deficit present.     Mental Status: He is alert and oriented to person, place,  and time. Mental status is at baseline.  Psychiatric:        Mood and Affect: Mood normal.        Behavior: Behavior normal.        Thought Content: Thought content normal.        Judgment: Judgment normal.      No results found for any visits on 10/24/22.  Last CBC Lab Results  Component Value Date   WBC 15.4 (H) 09/06/2022   HGB 14.5 09/06/2022   HCT  43.8 09/06/2022   MCV 95.2 09/06/2022   MCH 31.5 09/06/2022   RDW 13.0 09/06/2022   PLT 378 09/06/2022   Last metabolic panel Lab Results  Component Value Date   GLUCOSE 151 (H) 09/06/2022   NA 139 09/06/2022   K 3.7 09/06/2022   CL 105 09/06/2022   CO2 24 09/06/2022   BUN 14 09/06/2022   CREATININE 1.03 09/06/2022   GFRNONAA >60 09/06/2022   CALCIUM 8.5 (L) 09/06/2022   PROT 7.3 12/14/2021   ALBUMIN 3.9 12/14/2021   LABGLOB 2.4 12/05/2021   AGRATIO 2.0 12/05/2021   BILITOT 1.0 12/14/2021   ALKPHOS 55 12/14/2021   AST 21 12/14/2021   ALT 28 12/14/2021   ANIONGAP 10 09/06/2022   Last lipids Lab Results  Component Value Date   CHOL 105 02/15/2022   HDL 31 (L) 02/15/2022   LDLCALC 58 02/15/2022   TRIG 78 02/15/2022   CHOLHDL 3.4 02/15/2022   Last hemoglobin A1c Lab Results  Component Value Date   HGBA1C 6.1 (H) 12/05/2021   Last thyroid functions Lab Results  Component Value Date   TSH 1.486 10/04/2021   Last vitamin D No results found for: "25OHVITD2", "25OHVITD3", "VD25OH" Last vitamin B12 and Folate No results found for: "VITAMINB12", "FOLATE"    The ASCVD Risk score (Arnett DK, et al., 2019) failed to calculate for the following reasons:   The patient has a prior MI or stroke diagnosis    Assessment & Plan:  1. Lumbar disc disease with radiculopathy  Patient was restarted on gabapentin, educated on medication side effects and advised to go to the ED for worsening symptoms. Encouraged to continue back exercises as tolerated. Patient advised to call clinic if symptoms do not improve or  worsen. - gabapentin (NEURONTIN) 100 MG capsule; Take 1 capsule (100 mg total) by mouth 3 (three) times daily.  Dispense: 90 capsule; Refill: 0    Return in about 4 weeks (around 11/21/2022), or if symptoms worsen or fail to improve.    Chioma Trellis Paganini, NP

## 2022-10-24 NOTE — Patient Instructions (Signed)

## 2022-11-11 ENCOUNTER — Other Ambulatory Visit: Payer: Self-pay | Admitting: Family

## 2022-11-21 ENCOUNTER — Ambulatory Visit: Payer: Self-pay | Admitting: Gerontology

## 2022-11-27 ENCOUNTER — Ambulatory Visit: Payer: Self-pay | Admitting: Gerontology

## 2022-11-28 ENCOUNTER — Ambulatory Visit: Payer: Self-pay | Admitting: Gerontology

## 2022-11-28 ENCOUNTER — Other Ambulatory Visit: Payer: Self-pay

## 2022-11-28 ENCOUNTER — Encounter: Payer: Self-pay | Admitting: Gerontology

## 2022-11-28 VITALS — BP 142/88 | HR 78 | Temp 97.9°F | Resp 18 | Ht 69.0 in | Wt 249.8 lb

## 2022-11-28 DIAGNOSIS — M5116 Intervertebral disc disorders with radiculopathy, lumbar region: Secondary | ICD-10-CM

## 2022-11-28 MED ORDER — MELOXICAM 7.5 MG PO TABS: 7.5000 mg | ORAL_TABLET | Freq: Every day | ORAL | 0 refills | Status: DC

## 2022-11-28 MED ORDER — GABAPENTIN 100 MG PO CAPS: 100.0000 mg | ORAL_CAPSULE | Freq: Three times a day (TID) | ORAL | 0 refills | Status: AC

## 2022-11-28 NOTE — Patient Instructions (Signed)
Back Exercises These exercises help to make your trunk and back strong. They also help to keep the lower back flexible. Doing these exercises can help to prevent or lessen pain in your lower back. If you have back pain, try to do these exercises 2-3 times each day or as told by your doctor. As you get better, do the exercises once each day. Repeat the exercises more often as told by your doctor. To stop back pain from coming back, do the exercises once each day, or as told by your doctor. Do exercises exactly as told by your doctor. Stop right away if you feel sudden pain or your pain gets worse. Exercises Single knee to chest Do these steps 3-5 times in a row for each leg: Lie on your back on a firm bed or the floor with your legs stretched out. Bring one knee to your chest. Grab your knee or thigh with both hands and hold it in place. Pull on your knee until you feel a gentle stretch in your lower back or butt. Keep doing the stretch for 10-30 seconds. Slowly let go of your leg and straighten it. Pelvic tilt Do these steps 5-10 times in a row: Lie on your back on a firm bed or the floor with your legs stretched out. Bend your knees so they point up to the ceiling. Your feet should be flat on the floor. Tighten your lower belly (abdomen) muscles to press your lower back against the floor. This will make your tailbone point up to the ceiling instead of pointing down to your feet or the floor. Stay in this position for 5-10 seconds while you gently tighten your muscles and breathe evenly. Cat-cow Do these steps until your lower back bends more easily: Get on your hands and knees on a firm bed or the floor. Keep your hands under your shoulders, and keep your knees under your hips. You may put padding under your knees. Let your head hang down toward your chest. Tighten (contract) the muscles in your belly. Point your tailbone toward the floor so your lower back becomes rounded like the back of a  cat. Stay in this position for 5 seconds. Slowly lift your head. Let the muscles of your belly relax. Point your tailbone up toward the ceiling so your back forms a sagging arch like the back of a cow. Stay in this position for 5 seconds.  Press-ups Do these steps 5-10 times in a row: Lie on your belly (face-down) on a firm bed or the floor. Place your hands near your head, about shoulder-width apart. While you keep your back relaxed and keep your hips on the floor, slowly straighten your arms to raise the top half of your body and lift your shoulders. Do not use your back muscles. You may change where you place your hands to make yourself more comfortable. Stay in this position for 5 seconds. Keep your back relaxed. Slowly return to lying flat on the floor.  Bridges Do these steps 10 times in a row: Lie on your back on a firm bed or the floor. Bend your knees so they point up to the ceiling. Your feet should be flat on the floor. Your arms should be flat at your sides, next to your body. Tighten your butt muscles and lift your butt off the floor until your waist is almost as high as your knees. If you do not feel the muscles working in your butt and the back of  your thighs, slide your feet 1-2 inches (2.5-5 cm) farther away from your butt. Stay in this position for 3-5 seconds. Slowly lower your butt to the floor, and let your butt muscles relax. If this exercise is too easy, try doing it with your arms crossed over your chest. Belly crunches Do these steps 5-10 times in a row: Lie on your back on a firm bed or the floor with your legs stretched out. Bend your knees so they point up to the ceiling. Your feet should be flat on the floor. Cross your arms over your chest. Tip your chin a little bit toward your chest, but do not bend your neck. Tighten your belly muscles and slowly raise your chest just enough to lift your shoulder blades a tiny bit off the floor. Avoid raising your body  higher than that because it can put too much stress on your lower back. Slowly lower your chest and your head to the floor. Back lifts Do these steps 5-10 times in a row: Lie on your belly (face-down) with your arms at your sides, and rest your forehead on the floor. Tighten the muscles in your legs and your butt. Slowly lift your chest off the floor while you keep your hips on the floor. Keep the back of your head in line with the curve in your back. Look at the floor while you do this. Stay in this position for 3-5 seconds. Slowly lower your chest and your face to the floor. Contact a doctor if: Your back pain gets a lot worse when you do an exercise. Your back pain does not get better within 2 hours after you exercise. If you have any of these problems, stop doing the exercises. Do not do them again unless your doctor says it is okay. Get help right away if: You have sudden, very bad back pain. If this happens, stop doing the exercises. Do not do them again unless your doctor says it is okay. This information is not intended to replace advice given to you by your health care provider. Make sure you discuss any questions you have with your health care provider. Document Revised: 08/30/2020 Document Reviewed: 08/30/2020 Elsevier Patient Education  2024 [employer].

## 2022-11-28 NOTE — Progress Notes (Signed)
Established Patient Office Visit  Subjective   Patient ID: Brian Porter, male    DOB: [birth year]  Age: 57 y.o. MRN: [id number]  Chief Complaint  Patient presents with   Follow-up    Last visit. Patient has active [employer] ins effective 10/30/22    HPI  Brian Porter is a 56y/o male who has a history of CAD, CHF, GERD, Hypertension, MI, who presents today for follow up about his right lower back pain. Patient reports that he continues to experience  intermittent shooting  pain to his right lower back, that radiates down both of his legs and feeling numb sometimes. He states rest  and taking gabapentin relieve symptoms in the past.  The pain is worst after prolonged standing and walking. He rates his pain as  7/10. He denies muscle nor motor weakness, bowel/bladder incontinence and saddle anesthesia. He has health insurance and today will be his last visit. Overall, he states that he's doing well and offers no further complaint.  Review of Systems  Constitutional: Negative.   Respiratory: Negative.    Cardiovascular: Negative.   Musculoskeletal:  Positive for back pain (chronic right lower back pain).  Neurological: Negative.   Psychiatric/Behavioral: Negative.        Objective:     BP (!) 142/88 (BP Location: Left Arm, Patient Position: Sitting, Cuff Size: Large)   Pulse 78   Temp 97.9 F (36.6 C) (Oral)   Resp 18   Ht 5\' 9"  (1.753 m)   Wt 249 lb 12.8 oz (113.3 kg)   SpO2 92%   BMI 36.89 kg/m  BP Readings from Last 3 Encounters:  11/28/22 (!) 142/88  10/24/22 137/82  09/12/22 (!) 144/88   Wt Readings from Last 3 Encounters:  11/28/22 249 lb 12.8 oz (113.3 kg)  10/24/22 251 lb (113.9 kg)  09/12/22 245 lb 1.6 oz (111.2 kg)      Physical Exam HENT:     Head: Normocephalic and atraumatic.  Cardiovascular:     Rate and Rhythm: Normal rate and regular rhythm.     Pulses: Normal pulses.     Heart sounds: Normal heart sounds.  Pulmonary:     Effort: Pulmonary effort is  normal.     Breath sounds: Normal breath sounds.  Musculoskeletal:        General: Tenderness (palpation to right lower back) present.  Skin:    General: Skin is warm.  Neurological:     General: No focal deficit present.     Mental Status: He is alert and oriented to person, place, and time. Mental status is at baseline.  Psychiatric:        Mood and Affect: Mood normal.        Behavior: Behavior normal.        Thought Content: Thought content normal.        Judgment: Judgment normal.      No results found for any visits on 11/28/22.  Last CBC Lab Results  Component Value Date   WBC 15.4 (H) 09/06/2022   HGB 14.5 09/06/2022   HCT 43.8 09/06/2022   MCV 95.2 09/06/2022   MCH 31.5 09/06/2022   RDW 13.0 09/06/2022   PLT 378 09/06/2022   Last metabolic panel Lab Results  Component Value Date   GLUCOSE 151 (H) 09/06/2022   NA 139 09/06/2022   K 3.7 09/06/2022   CL 105 09/06/2022   CO2 24 09/06/2022   BUN 14 09/06/2022   CREATININE 1.03 09/06/2022  GFRNONAA >60 09/06/2022   CALCIUM 8.5 (L) 09/06/2022   PROT 7.3 12/14/2021   ALBUMIN 3.9 12/14/2021   LABGLOB 2.4 12/05/2021   AGRATIO 2.0 12/05/2021   BILITOT 1.0 12/14/2021   ALKPHOS 55 12/14/2021   AST 21 12/14/2021   ALT 28 12/14/2021   ANIONGAP 10 09/06/2022   Last lipids Lab Results  Component Value Date   CHOL 105 02/15/2022   HDL 31 (L) 02/15/2022   LDLCALC 58 02/15/2022   TRIG 78 02/15/2022   CHOLHDL 3.4 02/15/2022   Last hemoglobin A1c Lab Results  Component Value Date   HGBA1C 6.1 (H) 12/05/2021   Last thyroid functions Lab Results  Component Value Date   TSH 1.486 10/04/2021   Last vitamin D No results found for: "25OHVITD2", "25OHVITD3", "VD25OH" Last vitamin B12 and Folate No results found for: "VITAMINB12", "FOLATE"    The ASCVD Risk score (Arnett DK, et al., 2019) failed to calculate for the following reasons:   The patient has a prior MI or stroke diagnosis    Assessment & Plan:    1. Lumbar disc disease with radiculopathy - He will continue on current medication, was started on Meloxicam, educated on medication side effects and to notify clinic. He was advised to goto the ED for worsening symptoms. - gabapentin (NEURONTIN) 100 MG capsule; Take 1 capsule (100 mg total) by mouth 3 (three) times daily.  Dispense: 90 capsule; Refill: 0 - meloxicam (MOBIC) 7.5 MG tablet; Take 1 tablet (7.5 mg total) by mouth daily.  Dispense: 30 tablet; Refill: 0    Return if symptoms worsen or fail to improve. He has no follow up appointment because he health insurance. [facility] wishes him well with his care.   Leith Szafranski Trellis Paganini, NP

## 2022-11-30 ENCOUNTER — Other Ambulatory Visit: Payer: Self-pay | Admitting: Family

## 2022-12-02 ENCOUNTER — Other Ambulatory Visit: Payer: Self-pay

## 2022-12-02 DIAGNOSIS — I5022 Chronic systolic (congestive) heart failure: Secondary | ICD-10-CM

## 2022-12-02 MED ORDER — METOPROLOL SUCCINATE ER 25 MG PO TB24: 25.0000 mg | ORAL_TABLET | Freq: Every day | ORAL | 3 refills | Status: DC

## 2022-12-12 ENCOUNTER — Ambulatory Visit: Payer: Self-pay | Admitting: Gerontology

## 2023-01-02 DIAGNOSIS — K047 Periapical abscess without sinus: Secondary | ICD-10-CM | POA: Diagnosis not present

## 2023-01-03 ENCOUNTER — Encounter: Payer: Self-pay | Admitting: Family

## 2023-01-03 ENCOUNTER — Ambulatory Visit: Payer: Self-pay | Attending: Family | Admitting: Family

## 2023-01-03 VITALS — BP 122/75 | HR 79 | Ht 69.0 in | Wt 252.0 lb

## 2023-01-03 DIAGNOSIS — J449 Chronic obstructive pulmonary disease, unspecified: Secondary | ICD-10-CM | POA: Insufficient documentation

## 2023-01-03 DIAGNOSIS — I110 Hypertensive heart disease with heart failure: Secondary | ICD-10-CM | POA: Insufficient documentation

## 2023-01-03 DIAGNOSIS — I5022 Chronic systolic (congestive) heart failure: Secondary | ICD-10-CM | POA: Insufficient documentation

## 2023-01-03 DIAGNOSIS — I420 Dilated cardiomyopathy: Secondary | ICD-10-CM | POA: Insufficient documentation

## 2023-01-03 DIAGNOSIS — K219 Gastro-esophageal reflux disease without esophagitis: Secondary | ICD-10-CM | POA: Insufficient documentation

## 2023-01-03 DIAGNOSIS — Z87891 Personal history of nicotine dependence: Secondary | ICD-10-CM | POA: Insufficient documentation

## 2023-01-03 DIAGNOSIS — I10 Essential (primary) hypertension: Secondary | ICD-10-CM

## 2023-01-03 DIAGNOSIS — G4733 Obstructive sleep apnea (adult) (pediatric): Secondary | ICD-10-CM | POA: Insufficient documentation

## 2023-01-03 DIAGNOSIS — I252 Old myocardial infarction: Secondary | ICD-10-CM | POA: Insufficient documentation

## 2023-01-03 DIAGNOSIS — I2510 Atherosclerotic heart disease of native coronary artery without angina pectoris: Secondary | ICD-10-CM | POA: Insufficient documentation

## 2023-01-03 DIAGNOSIS — Z79899 Other long term (current) drug therapy: Secondary | ICD-10-CM | POA: Insufficient documentation

## 2023-01-03 NOTE — Progress Notes (Signed)
PCP: [facility] (last seen 05/24) Primary Cardiologist: Leanora Ivanoff, PA (last seen 09/23)  HPI:  Brian Porter is a 57 y/o male with a history of obstructive sleep apnea (without CPAP), CAD, COPD, HTN, GERD, MI, current tobacco use and chronic heart failure.   Echo 02/15/22: EF of 35-40% along with mild Brian. Echo report from 10/22/20 reviewed and showed an EF of 25-30% along with mild Brian/AR. Echo report from 05/24/2019 reviewed and showed an EF of 30-35% along with trivial TR/PR. Echo done 10/15/15 and showed an EF of 35% along with moderate Brian/TR and an elevated PA pressure of 48 mm Hg.   LHC done 02/15/22:    RPAV-2 lesion is 90% stenosed.   RPDA lesion is 75% stenosed.   2nd RPL lesion is 80% stenosed.   1st Mrg lesion is 90% stenosed.   1st Diag lesion is 90% stenosed.   2nd Diag lesion is 90% stenosed.   Mid LAD-1 lesion is 10% stenosed.   Mid LAD-2 lesion is 50% stenosed.   RPAV-1 lesion is 90% stenosed.   There is moderate to severe left ventricular systolic dysfunction.   The left ventricular ejection fraction is 25-35% by visual estimate.  1.  NSTEMI 2.  Three-vessel coronary artery disease 90% stenosis D1, 90% stenosis D2, 90% stenosis OM1, 90% stenosis RPAV 1 and 2, 75% stenosis RPDA, without obvious culprit vessel, not ideal for percutaneous revascularization or surgical revascularization 3.  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 Recommended treatment for cardiomyopathy.   Was in the ED 09/06/22 due to cough and right sided chest pain thought to be due to COPD exacerbation.   He presents today for a HF follow-up visit with a chief complaint of minimal SOB with moderate exertion. Chronic in nature. Has associated fatigue, intermittent chest pain, dizziness, pedal edema & gradual weight gain along with this. Denies  cough, abdominal distention or difficulty sleeping.   Weighing daily. Using some salt but less than he used to. Drinks anywhere from 3-6 bottles of water daily. Tends to eat cantaloupe for breakfast. Overall, he says that he's feeling well.   ROS: All systems negative except as listed in HPI, PMH and Problem List.  SH:  Social History   Socioeconomic History   Marital status: Single    Spouse name: Not on file   Number of children: Not on file   Years of education: Not on file   Highest education level: Not on file  Occupational History   Occupation: unemployed  Tobacco Use   Smoking status: Former    Packs/day: .5    Types: Cigarettes    Quit date: 10/24/2020    Years since quitting: 2.1   Smokeless tobacco: Never  Vaping Use   Vaping Use: Never used  Substance and Sexual Activity   Alcohol use: No    Alcohol/week: 0.0 standard drinks of alcohol   Drug use: Not Currently    Comment: percocet   Sexual activity: Not Currently  Other Topics Concern   Not on file  Social History Narrative   Not on file   Social Determinants of Health   Financial Resource Strain: Low Risk  (09/12/2022)   Overall Financial Resource Strain (CARDIA)    Difficulty of Paying Living Expenses: Not very hard  Food Insecurity: No Food Insecurity (09/12/2022)   Hunger Vital Sign  Worried About Programme researcher, broadcasting/film/video in the Last Year: Never true    Ran Out of Food in the Last Year: Never true  Transportation Needs: No Transportation Needs (09/12/2022)   PRAPARE - Administrator, Civil Service (Medical): No    Lack of Transportation (Non-Medical): No  Physical Activity: Sufficiently Active (09/12/2022)   Exercise Vital Sign    Days of Exercise per Week: 7 days    Minutes of Exercise per Session: 30 min  Stress: No Stress Concern Present (09/12/2022)   [employer] of Occupational Health - Occupational Stress Questionnaire    Feeling of Stress : Not at all  Social Connections:  Moderately Integrated (09/12/2022)   Social Connection and Isolation Panel [NHANES]    Frequency of Communication with Friends and Family: More than three times a week    Frequency of Social Gatherings with Friends and Family: Once a week    Attends Religious Services: More than 4 times per year    Active Member of [employer] or Organizations: No    Attends Banker Meetings: Never    Marital Status: Living with partner  Intimate Partner Violence: Not At Risk (09/12/2022)   Humiliation, Afraid, Rape, and Kick questionnaire    Fear of Current or Ex-Partner: No    Emotionally Abused: No    Physically Abused: No    Sexually Abused: No    FH:  Family History  Problem Relation Age of Onset   Hypertension Mother    Other Mother        covid pneumonia   Congestive Heart Failure Mother    Stroke Father    Heart attack Father 29   Diabetes Father    Hypertension Father    Diabetes Mellitus II Brother    Alzheimer's disease Maternal Grandmother    Heart attack Maternal Grandfather    Cancer Paternal Grandmother    Stroke Paternal Grandfather     Past Medical History:  Diagnosis Date   CAD (coronary artery disease)    CHF (congestive heart failure) (HCC)    GERD (gastroesophageal reflux disease)    Hypertension    MI, old    Sleep apnea     Current Outpatient Medications  Medication Sig Dispense Refill   acetaminophen (TYLENOL) 650 MG CR tablet Take 650 mg by mouth every 8 (eight) hours as needed for pain.     albuterol (VENTOLIN HFA) 108 (90 Base) MCG/ACT inhaler Inhale 2 puffs into the lungs every 6 (six) hours as needed for wheezing or shortness of breath. 8 g 2   aspirin 81 MG EC tablet Take 1 tablet (81 mg total) by mouth daily. 30 tablet 2   atorvastatin (LIPITOR) 80 MG tablet Take 1 tablet (80 mg total) by mouth daily. 90 tablet 1   furosemide (LASIX) 40 MG tablet Take 1 tablet by mouth once daily 90 tablet 1   gabapentin (NEURONTIN) 100 MG capsule Take 1 capsule  (100 mg total) by mouth 3 (three) times daily. 90 capsule 0   ibuprofen (ADVIL) 200 MG tablet Take 400 mg by mouth every 6 (six) hours as needed.     meloxicam (MOBIC) 7.5 MG tablet Take 1 tablet (7.5 mg total) by mouth daily. 30 tablet 0   metoprolol succinate (TOPROL-XL) 25 MG 24 hr tablet Take 1 tablet (25 mg total) by mouth daily. 90 tablet 3   potassium chloride (KLOR-CON M) 10 MEQ tablet Take 1 tablet (10 mEq total) by mouth daily. 90 tablet  3   ranolazine (RANEXA) 500 MG 12 hr tablet Take 1 tablet (500 mg total) by mouth 2 (two) times daily. 60 tablet 0   spironolactone (ALDACTONE) 25 MG tablet Take 1 tablet (25 mg total) by mouth daily. 90 tablet 3   No current facility-administered medications for this visit.   Vitals:   01/03/23 1319  BP: 122/75  Pulse: 79  SpO2: 95%  Weight: 252 lb (114.3 kg)  Height: 5\' 9"  (1.753 m)   Wt Readings from Last 3 Encounters:  01/03/23 252 lb (114.3 kg)  11/28/22 249 lb 12.8 oz (113.3 kg)  10/24/22 251 lb (113.9 kg)   Lab Results  Component Value Date   CREATININE 1.03 09/06/2022   CREATININE 0.86 03/01/2022   CREATININE 0.79 02/28/2022   PHYSICAL EXAM:  General:  Well appearing. No resp difficulty HEENT: normal Neck: supple. JVP flat. No lymphadenopathy or thryomegaly appreciated. Cor: PMI normal. Regular rate & rhythm. No rubs, gallops or murmurs. Lungs: clear Abdomen: soft, nontender, nondistended. No hepatosplenomegaly. No bruits or masses.  Extremities: no cyanosis, clubbing, rash, trace pitting edema bilateral lower legs Neuro: alert & orientedx3, cranial nerves grossly intact. Moves all 4 extremities w/o difficulty. Affect pleasant.   ECG: not done   ASSESSMENT & PLAN:  1: Chronic heart failure with reduced ejection fraction- - suspect due to HTN/ CAD - NYHA class II - euvolemic in appearance  - weighing daily & he says that this weight gain has been gradual; Reminded to call for an overnight weight gain of >2 pounds or  a weekly weight gain of >5 pounds.  - weight up 10 pounds since he was last here 6 months ago - Echo 02/15/22: EF of 35-40% along with mild Brian.  - Echo report from 10/22/20 reviewed and showed an EF of 25-30% along with mild Brian/AR.  - Echo report from 05/24/2019 reviewed and showed an EF of 30-35% along with trivial TR/PR.  - Echo done 10/15/15 and showed an EF of 35% along with moderate Brian/TR and an elevated PA pressure of 48 mm Hg.  - continue metoprolol succinate 25mg  daily - continue spironolactone 25mg  daily - continue furosemide 40mg  daily/ daily - unable to tolerate entresto or jardiance; tolerated farxiga but wasn't approved for patient assistance and he can't afford to take it - has been adding a little salt and admits that he could be doing better with monitoring it; encouraged to not add any salt to his food - wearing compression socks daily - BNP 12/14/21 was 28.0  2: HTN- - BP 122/75 - saw provider at [facility] 05/24 - BMP from 09/06/22 showed sodium 139, potassium 3.7, creatinine 1.03 and GFR >60  3: CAD- - saw cardiology Mellissa Kohut) 09/23 - continue atorvastatin 80mg  - LHC done 02/15/22:    RPAV-2 lesion is 90% stenosed.   RPDA lesion is 75% stenosed.   2nd RPL lesion is 80% stenosed.   1st Mrg lesion is 90% stenosed.   1st Diag lesion is 90% stenosed.   2nd Diag lesion is 90% stenosed.   Mid LAD-1 lesion is 10% stenosed.   Mid LAD-2 lesion is 50% stenosed.   RPAV-1 lesion is 90% stenosed.   There is moderate to severe left ventricular systolic dysfunction.   The left ventricular ejection fraction is 25-35% by visual estimate.  1.  NSTEMI 2.  Three-vessel coronary artery disease 90% stenosis D1, 90% stenosis D2, 90% stenosis OM1, 90% stenosis RPAV 1 and 2, 75% stenosis RPDA, without  obvious culprit vessel, not ideal for percutaneous revascularization or surgical revascularization 3.  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   Return in 6 months, sooner if needed.

## 2023-01-22 ENCOUNTER — Other Ambulatory Visit: Payer: Self-pay | Admitting: Obstetrics and Gynecology

## 2023-01-23 ENCOUNTER — Other Ambulatory Visit: Payer: Self-pay

## 2023-03-04 ENCOUNTER — Emergency Department: Payer: Self-pay

## 2023-03-04 ENCOUNTER — Emergency Department
Admission: EM | Admit: 2023-03-04 | Discharge: 2023-03-04 | Disposition: A | Discharge status: Home / Self Care | Payer: Self-pay | Attending: Emergency Medicine | Admitting: Emergency Medicine

## 2023-03-04 ENCOUNTER — Other Ambulatory Visit: Payer: Self-pay

## 2023-03-04 DIAGNOSIS — R1031 Right lower quadrant pain: Secondary | ICD-10-CM

## 2023-03-04 DIAGNOSIS — I2510 Atherosclerotic heart disease of native coronary artery without angina pectoris: Secondary | ICD-10-CM | POA: Insufficient documentation

## 2023-03-04 LAB — COMPREHENSIVE METABOLIC PANEL
ALT: 24 U/L (ref 0–44)
AST: 17 U/L (ref 15–41)
Albumin: 3.8 g/dL (ref 3.5–5.0)
Alkaline Phosphatase: 48 U/L (ref 38–126)
Anion gap: 9 (ref 5–15)
BUN: 23 mg/dL — ABNORMAL HIGH (ref 6–20)
CO2: 25 mmol/L (ref 22–32)
Calcium: 8.6 mg/dL — ABNORMAL LOW (ref 8.9–10.3)
Chloride: 102 mmol/L (ref 98–111)
Creatinine, Ser: 1.11 mg/dL (ref 0.61–1.24)
GFR, Estimated: >60 mL/min (ref >60)
Glucose, Bld: 133 mg/dL — ABNORMAL HIGH (ref 70–99)
Potassium: 3.6 mmol/L (ref 3.5–5.1)
Sodium: 136 mmol/L (ref 135–145)
Total Bilirubin: 0.8 mg/dL (ref 0.3–1.2)
Total Protein: 7.4 g/dL (ref 6.5–8.1)

## 2023-03-04 LAB — CBC
HCT: 41.3 % (ref 39.0–52.0)
Hemoglobin: 13.9 g/dL (ref 13.0–17.0)
MCH: 31.4 pg (ref 26.0–34.0)
MCHC: 33.7 g/dL (ref 30.0–36.0)
MCV: 93.2 fL (ref 80.0–100.0)
Platelets: 376 10*3/uL (ref 150–400)
RBC: 4.43 MIL/uL (ref 4.22–5.81)
RDW: 12.8 % (ref 11.5–15.5)
WBC: 10.3 10*3/uL (ref 4.0–10.5)
nRBC: 0 % (ref 0.0–0.2)

## 2023-03-04 LAB — URINALYSIS, ROUTINE W REFLEX MICROSCOPIC
Bilirubin Urine: NEGATIVE
Glucose, UA: NEGATIVE mg/dL
Hgb urine dipstick: NEGATIVE
Ketones, ur: NEGATIVE mg/dL
Leukocytes,Ua: NEGATIVE
Nitrite: NEGATIVE
Protein, ur: NEGATIVE mg/dL
Specific Gravity, Urine: 1.02 (ref 1.005–1.030)
pH: 7 (ref 5.0–8.0)

## 2023-03-04 LAB — LIPASE, BLOOD: Lipase: 59 U/L — ABNORMAL HIGH (ref 11–51)

## 2023-03-04 MED ORDER — LORAZEPAM 1 MG PO TABS
1.0000 mg | ORAL_TABLET | Freq: Once | ORAL | Status: AC
  Administered 2023-03-04: 1 mg via ORAL
  Filled 2023-03-04: qty 1

## 2023-03-04 MED ORDER — IOHEXOL 300 MG/ML  SOLN
100.0000 mL | Freq: Once | INTRAMUSCULAR | Status: AC | PRN
  Administered 2023-03-04: 100 mL via INTRAVENOUS

## 2023-03-04 NOTE — ED Provider Notes (Signed)
-----------------------------------------   3:29 PM on 03/04/2023 -----------------------------------------  Blood pressure 119/78, pulse 73, temperature 98.2 F (36.8 C), temperature source Oral, resp. rate 18, height 5\' 9"  (1.753 m), weight 114.3 kg, SpO2 95%.  Assuming care from Dr. Modesto Charon.  In short, Brian Porter is a 57 y.o. male with a chief complaint of Abdominal Pain .  Refer to the original H&P for additional details.  The current plan of care is to follow-up US for suspected inguinal hernia.  ----------------------------------------- 8:41 PM on 03/04/2023 ----------------------------------------- Ultrasound unremarkable with no clear source of patient's pain.  CT imaging was performed and negative for acute process, patient with minimal pain on reassessment and is appropriate for outpatient management.  He was counseled to return to the ED for new or worsening symptoms.  Patient agrees with plan.    Chesley Noon, MD 03/04/23 2042

## 2023-03-04 NOTE — ED Triage Notes (Signed)
Pt here with abd pain. Pt states he normally has a hernia in that area that he is able to push back in but this time the pain is not going away. Pt denies NVD but states pain is gradual and gets worse during the day. Pt still has his appendix.

## 2023-03-04 NOTE — ED Notes (Signed)
Pt took out his iv, gave him crackers peanut butter and cola.

## 2023-03-04 NOTE — ED Notes (Signed)
Patient transported to CT 

## 2023-03-04 NOTE — ED Provider Notes (Signed)
[facility] Provider Note    Event Date/Time   First MD Initiated Contact with Patient 03/04/23 1325     (approximate)   History   Abdominal Pain   HPI  Brian Porter is a 57 y.o. male   Past medical history of inguinal hernia status post repair, CAD who presents to the emergency department with right right inguinal pain.  He has been straining to make bowel movements over the last several weeks, and feels that the pain in his right inguinal area is consistent with hernia experienced in the past.  He had inguinal hernia repair at [facility] back in 2010.  There are no masses in the area but it feels sore.  He is making bowel movements now passing gas now and his last bowel movement was this morning.  He denies any fever chills or overlying skin changes.  He denies any scrotal changes or testicular pain. No Urinary symptoms   External Medical Documents Reviewed: Ultrasound pelvis obtained in 2018 showing a left-sided inguinal hernia, seen by surgery during that emergency department visit reviewed consult note noting that is reducible and that he had prior hernia repair and [facility] and asked to follow-up at [facility] for elective repair      Physical Exam   Triage Vital Signs: ED Triage Vitals  Encounter Vitals Group     BP 03/04/23 1304 (!) 147/83     Systolic BP Percentile --      Diastolic BP Percentile --      Pulse Rate 03/04/23 1304 86     Resp 03/04/23 1304 18     Temp 03/04/23 1305 98.2 F (36.8 C)     Temp Source 03/04/23 1305 Oral     SpO2 03/04/23 1304 95 %     Weight 03/04/23 1304 251 lb 15.8 oz (114.3 kg)     Height 03/04/23 1304 5\' 9"  (1.753 m)     Head Circumference --      Peak Flow --      Pain Score 03/04/23 1304 5     Pain Loc --      Pain Education --      Exclude from Growth Chart --     Most recent vital signs: Vitals:   03/04/23 1500 03/04/23 1530  BP: 119/78 124/81  Pulse: 73 67  Resp:  16  Temp:    SpO2:  94%     General: Awake, no distress.  CV:  Good peripheral perfusion.  Resp:  Normal effort.  Abd:  No distention.  Other:  No obvious masses the right inguinal area.  Testicles and scrotum appear normal.  Has some mild soreness to the area of the right inguinal without overlying skin changes.  Remainder of the abdomen is soft nontender nondistended.   ED Results / Procedures / Treatments   Labs (all labs ordered are listed, but only abnormal results are displayed) Labs Reviewed  LIPASE, BLOOD - Abnormal; Notable for the following components:      Result Value   Lipase 59 (*)    All other components within normal limits  COMPREHENSIVE METABOLIC PANEL - Abnormal; Notable for the following components:   Glucose, Bld 133 (*)    BUN 23 (*)    Calcium 8.6 (*)    All other components within normal limits  CBC  URINALYSIS, ROUTINE W REFLEX MICROSCOPIC     I ordered and reviewed the above labs they are notable for cell counts electrolytes within normal  limits.     PROCEDURES:  Critical Care performed: No  Procedures   MEDICATIONS ORDERED IN ED: Medications - No data to display  IMPRESSION / MDM / ASSESSMENT AND PLAN / ED COURSE  I reviewed the triage vital signs and the nursing notes.                                Patient's presentation is most consistent with acute presentation with potential threat to life or bodily function.  Differential diagnosis includes, but is not limited to, inguinal hernia, strangulated hernia, incarcerated hernia, bowel obstruction, muscle strain.   The patient is on the cardiac monitor to evaluate for evidence of arrhythmia and/or significant heart rate changes.  MDM: Patient with inguinal hernia in the past symptoms consistent with the same, but I cannot feel a hernia, not reducible because he cannot feel it, no signs of obstruction, appears well, subacute symptoms lasting last 2 weeks, mild.  Making bowel movements.  Will get an ultrasound  to further assess.  Follow-up with general surgery.        FINAL CLINICAL IMPRESSION(S) / ED DIAGNOSES   Final diagnoses:  Right inguinal pain     Rx / DC Orders   ED Discharge Orders     None        Note:  This document was prepared using Dragon voice recognition software and may include unintentional dictation errors.    Pilar Jarvis, MD 03/04/23 [phone]

## 2023-05-01 ENCOUNTER — Telehealth: Payer: Self-pay | Admitting: Family

## 2023-05-01 ENCOUNTER — Other Ambulatory Visit: Payer: Self-pay

## 2023-05-01 MED ORDER — POTASSIUM CHLORIDE CRYS ER 10 MEQ PO TBCR: 10.0000 meq | EXTENDED_RELEASE_TABLET | Freq: Every day | ORAL | 3 refills | Status: DC

## 2023-05-09 ENCOUNTER — Telehealth: Payer: Self-pay | Admitting: Family

## 2023-05-09 ENCOUNTER — Other Ambulatory Visit: Payer: Self-pay

## 2023-05-09 MED ORDER — FUROSEMIDE 40 MG PO TABS: 40.0000 mg | ORAL_TABLET | Freq: Every day | ORAL | 1 refills | Status: DC

## 2023-05-09 NOTE — Progress Notes (Signed)
Medication refill sent .

## 2023-05-13 ENCOUNTER — Emergency Department
Admission: EM | Admit: 2023-05-13 | Discharge: 2023-05-14 | Disposition: A | Discharge status: Home / Self Care | Payer: Self-pay | Attending: Emergency Medicine | Admitting: Emergency Medicine

## 2023-05-13 ENCOUNTER — Emergency Department: Payer: MEDICAID

## 2023-05-13 ENCOUNTER — Emergency Department
Admission: EM | Admit: 2023-05-13 | Discharge: 2023-05-13 | Disposition: A | Discharge status: Home / Self Care | Payer: MEDICAID | Attending: Emergency Medicine | Admitting: Emergency Medicine

## 2023-05-13 ENCOUNTER — Encounter: Payer: Self-pay | Admitting: Emergency Medicine

## 2023-05-13 ENCOUNTER — Other Ambulatory Visit: Payer: Self-pay

## 2023-05-13 DIAGNOSIS — I110 Hypertensive heart disease with heart failure: Secondary | ICD-10-CM | POA: Insufficient documentation

## 2023-05-13 DIAGNOSIS — I5022 Chronic systolic (congestive) heart failure: Secondary | ICD-10-CM | POA: Diagnosis not present

## 2023-05-13 DIAGNOSIS — M791 Myalgia, unspecified site: Secondary | ICD-10-CM | POA: Diagnosis not present

## 2023-05-13 DIAGNOSIS — Z20822 Contact with and (suspected) exposure to covid-19: Secondary | ICD-10-CM | POA: Insufficient documentation

## 2023-05-13 DIAGNOSIS — J069 Acute upper respiratory infection, unspecified: Secondary | ICD-10-CM | POA: Diagnosis not present

## 2023-05-13 DIAGNOSIS — R059 Cough, unspecified: Secondary | ICD-10-CM | POA: Diagnosis present

## 2023-05-13 LAB — CBC WITH DIFFERENTIAL/PLATELET
Abs Immature Granulocytes: 0.03 10*3/uL (ref 0.00–0.07)
Basophils Absolute: 0.1 10*3/uL (ref 0.0–0.1)
Basophils Relative: 1 %
Eosinophils Absolute: 0.5 10*3/uL (ref 0.0–0.5)
Eosinophils Relative: 3 %
HCT: 43.1 % (ref 39.0–52.0)
Hemoglobin: 14.5 g/dL (ref 13.0–17.0)
Immature Granulocytes: 0 %
Lymphocytes Relative: 17 %
Lymphs Abs: 2.7 10*3/uL (ref 0.7–4.0)
MCH: 32.2 pg (ref 26.0–34.0)
MCHC: 33.6 g/dL (ref 30.0–36.0)
MCV: 95.6 fL (ref 80.0–100.0)
Monocytes Absolute: 3.3 10*3/uL — ABNORMAL HIGH (ref 0.1–1.0)
Monocytes Relative: 21 %
Neutro Abs: 9.1 10*3/uL — ABNORMAL HIGH (ref 1.7–7.7)
Neutrophils Relative %: 58 %
Platelets: 375 10*3/uL (ref 150–400)
RBC: 4.51 MIL/uL (ref 4.22–5.81)
RDW: 13.1 % (ref 11.5–15.5)
Smear Review: NORMAL
WBC: 15.7 10*3/uL — ABNORMAL HIGH (ref 4.0–10.5)
nRBC: 0 % (ref 0.0–0.2)

## 2023-05-13 LAB — COMPREHENSIVE METABOLIC PANEL
ALT: 29 U/L (ref 0–44)
AST: 19 U/L (ref 15–41)
Albumin: 3.9 g/dL (ref 3.5–5.0)
Alkaline Phosphatase: 48 U/L (ref 38–126)
Anion gap: 9 (ref 5–15)
BUN: 19 mg/dL (ref 6–20)
CO2: 24 mmol/L (ref 22–32)
Calcium: 8.4 mg/dL — ABNORMAL LOW (ref 8.9–10.3)
Chloride: 107 mmol/L (ref 98–111)
Creatinine, Ser: 0.88 mg/dL (ref 0.61–1.24)
GFR, Estimated: >60 mL/min (ref >60)
Glucose, Bld: 130 mg/dL — ABNORMAL HIGH (ref 70–99)
Potassium: 3.5 mmol/L (ref 3.5–5.1)
Sodium: 140 mmol/L (ref 135–145)
Total Bilirubin: 0.5 mg/dL (ref <1.2)
Total Protein: 7.3 g/dL (ref 6.5–8.1)

## 2023-05-13 LAB — RESP PANEL BY RT-PCR (RSV, FLU A&B, COVID)  RVPGX2
Influenza A by PCR: NEGATIVE
Influenza B by PCR: NEGATIVE
Resp Syncytial Virus by PCR: NEGATIVE
SARS Coronavirus 2 by RT PCR: NEGATIVE

## 2023-05-13 LAB — TROPONIN I (HIGH SENSITIVITY): Troponin I (High Sensitivity): 17 ng/L (ref <18)

## 2023-05-13 MED ORDER — ACETAMINOPHEN 325 MG PO TABS
650.0000 mg | ORAL_TABLET | Freq: Once | ORAL | Status: AC
  Administered 2023-05-13: 650 mg via ORAL
  Filled 2023-05-13: qty 2

## 2023-05-13 MED ORDER — HYDROCOD POLI-CHLORPHE POLI ER 10-8 MG/5ML PO SUER
5.0000 mL | Freq: Once | ORAL | Status: AC
  Administered 2023-05-13: 5 mL via ORAL
  Filled 2023-05-13: qty 5

## 2023-05-13 MED ORDER — AZITHROMYCIN 250 MG PO TABS: ORAL_TABLET | ORAL | 0 refills | Status: DC

## 2023-05-13 MED ORDER — ALBUTEROL SULFATE HFA 108 (90 BASE) MCG/ACT IN AERS: INHALATION_SPRAY | RESPIRATORY_TRACT | 0 refills | Status: DC

## 2023-05-13 MED ORDER — HYDROCOD POLI-CHLORPHE POLI ER 10-8 MG/5ML PO SUER
5.0000 mL | Freq: Two times a day (BID) | ORAL | 0 refills | Status: DC | PRN
Start: 2023-05-13 — End: 2023-10-19

## 2023-05-13 MED ORDER — IPRATROPIUM-ALBUTEROL 0.5-2.5 (3) MG/3ML IN SOLN
3.0000 mL | Freq: Once | RESPIRATORY_TRACT | Status: AC
  Administered 2023-05-13: 3 mL via RESPIRATORY_TRACT
  Filled 2023-05-13: qty 3

## 2023-05-13 NOTE — ED Triage Notes (Signed)
Pt comes with c/o clear productive cough and congestion since yesterday. Pt denies any fevers.

## 2023-05-13 NOTE — Discharge Instructions (Signed)
Your COVID, flu, RSV test is negative.  Your chest x-ray has not been read yet by the radiologist, though he can find the results of this on MyChart.  Please return for any new, worsening, or change in symptoms or other concerns.  It was a pleasure caring for you today.

## 2023-05-13 NOTE — ED Provider Notes (Signed)
[facility] Provider Note    Event Date/Time   First MD Initiated Contact with Patient 05/13/23 1136     (approximate)   History   Cough   HPI  Brian Porter is a 57 y.o. male who presents today for evaluation of cough, nasal congestion, body aches since yesterday.  Patient reports that he did not get a COVID shot or a flu shot this year.  He denies chest pain or shortness of breath, reports of feeling occasionally wheezy when coughing.  He reports that he does not currently smoke cigarettes.  He denies any calf pain or leg swelling.  He denies any dyspnea on exertion.  No fevers or chills.  Patient Active Problem List   Diagnosis Date Noted   Sore throat 03/06/2022   Chest pain 02/28/2022   Peripheral neuropathy 02/28/2022   NSTEMI (non-ST elevated myocardial infarction) (HCC) 02/14/2022   Dyslipidemia 02/14/2022   Essential hypertension 02/14/2022   Chronic systolic CHF (congestive heart failure) (HCC) 02/14/2022   Elevated lipids 12/13/2021   Light-headedness 09/26/2021   Lumbar disc disease with radiculopathy 03/21/2021   Encounter for follow-up 03/21/2021   Muscle pain 02/22/2021   Hospital discharge follow-up 11/16/2020   PAD (peripheral artery disease) (HCC) 11/16/2020   CVA (cerebral vascular accident) (HCC) 10/21/2020   Prediabetes 10/05/2020   Leg pain 10/05/2020   CAP (community acquired pneumonia) 07/24/2018   Bilateral recurrent inguinal hernia without obstruction or gangrene    Acute on chronic systolic heart failure (HCC) 08/27/2016   HTN (hypertension) 11/02/2015   Tobacco abuse 11/02/2015   Hand joint pain 11/02/2015          Physical Exam   Triage Vital Signs: ED Triage Vitals  Encounter Vitals Group     BP 05/13/23 1125 121/79     Systolic BP Percentile --      Diastolic BP Percentile --      Pulse Rate 05/13/23 1125 95     Resp 05/13/23 1125 18     Temp 05/13/23 1125 98.1 F (36.7 C)     Temp src --      SpO2  05/13/23 1125 95 %     Weight 05/13/23 1144 251 lb 15.8 oz (114.3 kg)     Height 05/13/23 1144 5\' 9"  (1.753 m)     Head Circumference --      Peak Flow --      Pain Score 05/13/23 1124 6     Pain Loc --      Pain Education --      Exclude from Growth Chart --     Most recent vital signs: Vitals:   05/13/23 1125  BP: 121/79  Pulse: 95  Resp: 18  Temp: 98.1 F (36.7 C)  SpO2: 95%    Physical Exam Vitals and nursing note reviewed.  Constitutional:      General: Awake and alert. No acute distress.    Appearance: Normal appearance. The patient is normal weight.  HENT:     Head: Normocephalic and atraumatic.     Mouth: Mucous membranes are moist.  Eyes:     General: PERRL. Normal EOMs        Right eye: No discharge.        Left eye: No discharge.     Conjunctiva/sclera: Conjunctivae normal.  Cardiovascular:     Rate and Rhythm: Normal rate and regular rhythm.     Pulses: Normal pulses.  No JVD Pulmonary:  Effort: Pulmonary effort is normal. No respiratory distress.  No accessory muscle use.  Able to speak easily in complete sentences.    Breath sounds:   Coarse lung sounds bilaterally, faint expiratory wheezes bilaterally Abdominal:     Abdomen is soft. There is no abdominal tenderness. No rebound or guarding. No distention. Musculoskeletal:        General: No swelling. Normal range of motion.     Cervical back: Normal range of motion and neck supple.  No lower extremity edema or calf tenderness Skin:    General: Skin is warm and dry.     Capillary Refill: Capillary refill takes less than 2 seconds.     Findings: No rash.  Neurological:     Mental Status: The patient is awake and alert.      ED Results / Procedures / Treatments   Labs (all labs ordered are listed, but only abnormal results are displayed) Labs Reviewed  RESP PANEL BY RT-PCR (RSV, FLU A&B, COVID)  [id number]     EKG     RADIOLOGY I independently reviewed and interpreted imaging and agree  with radiologists findings.     PROCEDURES:  Critical Care performed:   Procedures   MEDICATIONS ORDERED IN ED: Medications  ipratropium-albuterol (DUONEB) 0.5-2.5 (3) MG/3ML nebulizer solution 3 mL (3 mLs Nebulization Given 05/13/23 1228)  acetaminophen (TYLENOL) tablet 650 mg (650 mg Oral Given 05/13/23 1227)  ipratropium-albuterol (DUONEB) 0.5-2.5 (3) MG/3ML nebulizer solution 3 mL (3 mLs Nebulization Given 05/13/23 1319)     IMPRESSION / MDM / ASSESSMENT AND PLAN / ED COURSE  I reviewed the triage vital signs and the nursing notes.   Differential diagnosis includes, but is not limited to, COVID, URI, bronchitis, pneumonia.  Patient is awake and alert, hemodynamically stable and afebrile.  He has normal oxygen saturation on room air.  He has no JVD, crackles, or lower extremity edema to suggest volume overload.  He has no orthopnea or dyspnea on exertion, and appears to be euvolemic.  No chest pain or pleurisy to suggest PE, no chest pain, exertional symptoms, shortness of breath, nausea to suggest acute coronary syndrome.  Symptoms of cough, runny nose, body aches are most consistent with viral URI.  He was given Tylenol and a DuoNeb with improvement of his symptoms and requested a second because he had such improvement.  He did not wish to wait for the results of his x-ray, though given his pre-existing lung conditions will treat with azithromycin for possible atypical pneumonia as I do not see a lobar and consolidation on his x-ray.  We discussed return precautions the importance of close outpatient follow-up.  Patient understands and agrees with plan.  He was discharged in stable condition.  I did follow-up on patient's x-ray after his departure.  X-ray reveals peribronchial thickening most consistent with chronic bronchitis, as well as cardiomegaly without congestive failure.     Patient's presentation is most consistent with acute complicated illness / injury requiring  diagnostic workup.      FINAL CLINICAL IMPRESSION(S) / ED DIAGNOSES   Final diagnoses:  Viral URI with cough     Rx / DC Orders   ED Discharge Orders          Ordered    azithromycin (ZITHROMAX Z-PAK) 250 MG tablet        05/13/23 1350             Note:  This document was prepared using Dragon voice recognition software and may include  unintentional dictation errors.   Keturah Shavers 05/13/23 1741    Sharman Cheek, MD 05/15/23 1024

## 2023-05-13 NOTE — Discharge Instructions (Signed)
You have been seen in the Emergency Department (ED) today for a likely viral illness.  Please drink plenty of clear fluids (water, Gatorade, chicken broth, etc).  You may use Tylenol and/or Motrin according to label instructions.  You can alternate between the two without any side effects.  ° °Please follow up with your doctor as listed above. ° °Call your doctor or return to the Emergency Department (ED) if you are unable to tolerate fluids due to vomiting, have worsening trouble breathing, become extremely tired or difficult to awaken, or if you develop any other symptoms that concern you. ° °

## 2023-05-13 NOTE — ED Triage Notes (Addendum)
Pt returned from visit today due to not feeling "much better". He states he feels "little bit of pressure over left chest area" which started all day. Endorses SOB when he walks but states that is not really new. When RN asked him what made him decide he needed to come back right now- he states he could not stop coughing. Is taking delsym for cough OTC. They did chest xray and covid swab today- all negative.

## 2023-05-13 NOTE — ED Provider Notes (Signed)
[facility] Provider Note    Event Date/Time   First MD Initiated Contact with Patient 05/13/23 2318     (approximate)   History   Chest Pain   HPI Brian Porter is a 57 y.o. male seen a few hours ago in the emergency department for viral symptoms and a cough.  He checked back in tonight because he has a persistent cough.  He said the cough was worse tonight and he wanted to see if he can get something for it.  He has been using over-the-counter Delsym but it is not helping very much.  No fever, some chest pain when he coughs.  No difficulty breathing greater than his baseline.  He is not sure if he has COPD but he used to smoke and has used albuterol in the past but he has no albuterol at home currently.     Physical Exam   Triage Vital Signs: ED Triage Vitals  Encounter Vitals Group     BP 05/13/23 2115 (!) 145/77     Systolic BP Percentile --      Diastolic BP Percentile --      Pulse Rate 05/13/23 2115 (!) 106     Resp 05/13/23 2115 20     Temp 05/13/23 2115 98.5 F (36.9 C)     Temp Source 05/13/23 2115 Oral     SpO2 05/13/23 2115 94 %     Weight --      Height --      Head Circumference --      Peak Flow --      Pain Score 05/13/23 2111 3     Pain Loc --      Pain Education --      Exclude from Growth Chart --     Most recent vital signs: Vitals:   05/13/23 2330 05/13/23 2356  BP: (!) 161/90   Pulse: 88   Resp: 18   Temp:    SpO2: 93% 96%    General: Awake, no obvious distress but coughing frequently. CV:  Good peripheral perfusion.  Mild tachycardia, normal heart sounds. Resp:  Normal effort. Speaking easily and comfortably, no accessory muscle usage nor intercostal retractions.  Lungs are clear to auscultation with no wheezing or coarse breath sounds.  Frequent cough. Abd:  No distention.    ED Results / Procedures / Treatments   Labs (all labs ordered are listed, but only abnormal results are displayed) Labs Reviewed   COMPREHENSIVE METABOLIC PANEL - Abnormal; Notable for the following components:      Result Value   Glucose, Bld 130 (*)    Calcium 8.4 (*)    All other components within normal limits  CBC WITH DIFFERENTIAL/PLATELET - Abnormal; Notable for the following components:   WBC 15.7 (*)    Neutro Abs 9.1 (*)    Monocytes Absolute 3.3 (*)    All other components within normal limits  PATHOLOGIST SMEAR REVIEW  TROPONIN I (HIGH SENSITIVITY)     EKG  ED ECG REPORT I, Loleta Rose, the attending physician, personally viewed and interpreted this ECG.  Date: 05/13/2023 EKG Time: 21: 12 Rate: 91 Rhythm: normal sinus rhythm with PVCs QRS Axis: Right superior axis deviation Intervals: normal ST/T Wave abnormalities: Non-specific ST segment / T-wave changes, but no clear evidence of acute ischemia. Narrative Interpretation: no definitive evidence of acute ischemia; does not meet STEMI criteria.     PROCEDURES:  Critical Care performed: No  Procedures  IMPRESSION / MDM / ASSESSMENT AND PLAN / ED COURSE  I reviewed the triage vital signs and the nursing notes.                              Differential diagnosis includes, but is not limited to, viral URI with cough, pneumonia, less likely PE or ACS.  Patient's presentation is most consistent with acute presentation with potential threat to life or bodily function.  Labs/studies ordered: EKG, high-sensitivity troponin, CMP, CBC with differential  Interventions/Medications given:  Medications  chlorpheniramine-HYDROcodone (TUSSIONEX) 10-8 MG/5ML suspension 5 mL (5 mLs Oral Given 05/13/23 2355)    (Note:  hospital course my include additional interventions and/or labs/studies not listed above.)   Mild tachycardia in the setting of symptoms consistent with viral URI.  As documented in the previous provider note from the visit earlier today, he has had nasal congestion, runny nose, and other symptoms consistent with viral URI,  now with cough likely exacerbated by the fact that he is a former smoker and may have mild COPD.  I think he may also benefit from an albuterol inhaler for bronchospasm so I wrote a prescription for that as well as Tussionex after verifying in the [place] controlled substance database that there are no concerning prescribing patterns.  I recommended close outpatient follow-up with his PCP and gave my usual return precautions.         FINAL CLINICAL IMPRESSION(S) / ED DIAGNOSES   Final diagnoses:  Viral URI with cough     Rx / DC Orders   ED Discharge Orders          Ordered    albuterol (VENTOLIN HFA) 108 (90 Base) MCG/ACT inhaler       Note to Pharmacy: Pharmacy may substitute brand and size for insurance-approved equivalent   05/13/23 2349    chlorpheniramine-HYDROcodone (TUSSIONEX) 10-8 MG/5ML  Every 12 hours PRN        05/13/23 2351             Note:  This document was prepared using Dragon voice recognition software and may include unintentional dictation errors.   Loleta Rose, MD 05/14/23 0000

## 2023-05-14 LAB — PATHOLOGIST SMEAR REVIEW

## 2023-07-10 NOTE — Progress Notes (Deleted)
 PCP: [facility] (last seen 05/24) Primary Cardiologist: Clarisa Kung, PA (last seen 09/23)  HPI:  [name] is a 58 y/o male with a history of obstructive sleep apnea (without CPAP), CAD, COPD, HTN, GERD, MI, current tobacco use and chronic heart failure.   Echo 02/15/22: EF of 35-40% along with mild MR. Echo report from 10/22/20 reviewed and showed an EF of 25-30% along with mild MR/AR. Echo report from 05/24/2019 reviewed and showed an EF of 30-35% along with trivial TR/PR. Echo done 10/15/15 and showed an EF of 35% along with moderate MR/TR and an elevated PA pressure of 48 mm Hg.   LHC done 02/15/22:    RPAV-2 lesion is 90% stenosed.   RPDA lesion is 75% stenosed.   2nd RPL lesion is 80% stenosed.   1st Mrg lesion is 90% stenosed.   1st Diag lesion is 90% stenosed.   2nd Diag lesion is 90% stenosed.   Mid LAD-1 lesion is 10% stenosed.   Mid LAD-2 lesion is 50% stenosed.   RPAV-1 lesion is 90% stenosed.   There is moderate to severe left ventricular systolic dysfunction.   The left ventricular ejection fraction is 25-35% by visual estimate.  1.  NSTEMI 2.  Three-vessel coronary artery disease 90% stenosis D1, 90% stenosis D2, 90% stenosis OM1, 90% stenosis RPAV 1 and 2, 75% stenosis RPDA, without obvious culprit vessel, not ideal for percutaneous revascularization or surgical revascularization 3.  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 Recommended treatment for cardiomyopathy.   Was in the ED 09/06/22 due to cough and right sided chest pain thought to be due to COPD exacerbation.   He presents today for a HF follow-up visit with a chief complaint of minimal SOB with moderate exertion. Chronic in nature. Has associated fatigue, intermittent chest pain, dizziness, pedal edema & gradual weight gain along with this. Denies  cough, abdominal distention or difficulty sleeping.   Weighing daily. Using some salt but less than he used to. Drinks anywhere from 3-6 bottles of water daily. Tends to eat cantaloupe for breakfast. Overall, he says that he's feeling well.   ROS: All systems negative except as listed in HPI, PMH and Problem List.  SH:  Social History   Socioeconomic History   Marital status: Single    Spouse name: Not on file   Number of children: Not on file   Years of education: Not on file   Highest education level: Not on file  Occupational History   Occupation: unemployed  Tobacco Use   Smoking status: Former    Current packs/day: 0.00    Types: Cigarettes    Quit date: 10/24/2020    Years since quitting: 2.7   Smokeless tobacco: Never  Vaping Use   Vaping status: Never Used  Substance and Sexual Activity   Alcohol use: No    Alcohol/week: 0.0 standard drinks of alcohol   Drug use: Not Currently    Comment: percocet   Sexual activity: Not Currently  Other Topics Concern   Not on file  Social History Narrative   Not on file   Social Drivers of Health   Financial Resource Strain: Low Risk  (09/12/2022)   Overall Financial Resource Strain (CARDIA)    Difficulty of Paying Living Expenses: Not very hard  Food Insecurity: No Food Insecurity (09/12/2022)   Hunger Vital Sign  Worried About Programme Researcher, Broadcasting/film/video in the Last Year: Never true    Ran Out of Food in the Last Year: Never true  Transportation Needs: No Transportation Needs (09/12/2022)   PRAPARE - Administrator, Civil Service (Medical): No    Lack of Transportation (Non-Medical): No  Physical Activity: Sufficiently Active (09/12/2022)   Exercise Vital Sign    Days of Exercise per Week: 7 days    Minutes of Exercise per Session: 30 min  Stress: No Stress Concern Present (09/12/2022)   [employer] of Occupational Health - Occupational Stress Questionnaire    Feeling of Stress : Not at all  Social Connections:  Moderately Integrated (09/12/2022)   Social Connection and Isolation Panel [NHANES]    Frequency of Communication with Friends and Family: More than three times a week    Frequency of Social Gatherings with Friends and Family: Once a week    Attends Religious Services: More than 4 times per year    Active Member of [employer] or Organizations: No    Attends Banker Meetings: Never    Marital Status: Living with partner  Intimate Partner Violence: Not At Risk (09/12/2022)   Humiliation, Afraid, Rape, and Kick questionnaire    Fear of Current or Ex-Partner: No    Emotionally Abused: No    Physically Abused: No    Sexually Abused: No    FH:  Family History  Problem Relation Age of Onset   Hypertension Mother    Other Mother        covid pneumonia   Congestive Heart Failure Mother    Stroke Father    Heart attack Father 68   Diabetes Father    Hypertension Father    Diabetes Mellitus II Brother    Alzheimer's disease Maternal Grandmother    Heart attack Maternal Grandfather    Cancer Paternal Grandmother    Stroke Paternal Grandfather     Past Medical History:  Diagnosis Date   CAD (coronary artery disease)    CHF (congestive heart failure) (HCC)    GERD (gastroesophageal reflux disease)    Hypertension    MI, old    Sleep apnea     Current Outpatient Medications  Medication Sig Dispense Refill   acetaminophen  (TYLENOL ) 650 MG CR tablet Take 650 mg by mouth every 8 (eight) hours as needed for pain.     albuterol  (VENTOLIN  HFA) 108 (90 Base) MCG/ACT inhaler Inhale 2-4 puffs by mouth every 4 hours as needed for wheezing, cough, and/or shortness of breath 6.7 g 0   aspirin  81 MG EC tablet Take 1 tablet (81 mg total) by mouth daily. 30 tablet 2   atorvastatin  (LIPITOR ) 80 MG tablet Take 1 tablet (80 mg total) by mouth daily. 90 tablet 1   azithromycin  (ZITHROMAX  Z-PAK) 250 MG tablet Take 2 tablets by mouth on day 1, and one tablet by mouth on days 2-5 6 each 0    chlorpheniramine-HYDROcodone  (TUSSIONEX) 10-8 MG/5ML Take 5 mLs by mouth every 12 (twelve) hours as needed for cough. 115 mL 0   furosemide  (LASIX ) 40 MG tablet Take 1 tablet (40 mg total) by mouth daily. 90 tablet 1   gabapentin  (NEURONTIN ) 100 MG capsule Take 1 capsule (100 mg total) by mouth 3 (three) times daily. (Patient not taking: Reported on 01/03/2023) 90 capsule 0   metoprolol  succinate (TOPROL -XL) 25 MG 24 hr tablet Take 1 tablet (25 mg total) by mouth daily. 90 tablet 3   potassium  chloride (KLOR-CON  M) 10 MEQ tablet Take 1 tablet (10 mEq total) by mouth daily. 90 tablet 3   ranolazine  (RANEXA ) 500 MG 12 hr tablet Take 1 tablet (500 mg total) by mouth 2 (two) times daily. 60 tablet 0   spironolactone  (ALDACTONE ) 25 MG tablet Take 1 tablet (25 mg total) by mouth daily. 90 tablet 3   No current facility-administered medications for this visit.   There were no vitals filed for this visit.  Wt Readings from Last 3 Encounters:  05/13/23 251 lb 15.8 oz (114.3 kg)  03/04/23 251 lb 15.8 oz (114.3 kg)  01/03/23 252 lb (114.3 kg)   Lab Results  Component Value Date   CREATININE 0.88 05/13/2023   CREATININE 1.11 03/04/2023   CREATININE 1.03 09/06/2022   PHYSICAL EXAM:  General:  Well appearing. No resp difficulty HEENT: normal Neck: supple. JVP flat. No lymphadenopathy or thryomegaly appreciated. Cor: PMI normal. Regular rate & rhythm. No rubs, gallops or murmurs. Lungs: clear Abdomen: soft, nontender, nondistended. No hepatosplenomegaly. No bruits or masses.  Extremities: no cyanosis, clubbing, rash, trace pitting edema bilateral lower legs Neuro: alert & orientedx3, cranial nerves grossly intact. Moves all 4 extremities w/o difficulty. Affect pleasant.   ECG: not done   ASSESSMENT & PLAN:  1: Chronic heart failure with reduced ejection fraction- - suspect due to HTN/ CAD - NYHA class II - euvolemic in appearance  - weighing daily & he says that this weight gain has been  gradual; Reminded to call for an overnight weight gain of >2 pounds or a weekly weight gain of >5 pounds.  - weight up 10 pounds since he was last here 6 months ago - Echo 02/15/22: EF of 35-40% along with mild MR.  - Echo report from 10/22/20 reviewed and showed an EF of 25-30% along with mild MR/AR.  - Echo report from 05/24/2019 reviewed and showed an EF of 30-35% along with trivial TR/PR.  - Echo done 10/15/15 and showed an EF of 35% along with moderate MR/TR and an elevated PA pressure of 48 mm Hg.  - continue metoprolol  succinate 25mg  daily - continue spironolactone  25mg  daily - continue furosemide  40mg  daily/ 10meq daily - unable to tolerate entresto  or jardiance ; tolerated farxiga  but wasn't approved for patient assistance and he can't afford to take it - has been adding a little salt and admits that he could be doing better with monitoring it; encouraged to not add any salt to his food - wearing compression socks daily - BNP 12/14/21 was 28.0  2: HTN- - BP 122/75 - saw provider at [facility] 05/24 - BMP from 09/06/22 showed sodium 139, potassium 3.7, creatinine 1.03 and GFR >60  3: CAD- - saw cardiology Melva) 09/23 - continue atorvastatin  80mg  - LHC done 02/15/22:    RPAV-2 lesion is 90% stenosed.   RPDA lesion is 75% stenosed.   2nd RPL lesion is 80% stenosed.   1st Mrg lesion is 90% stenosed.   1st Diag lesion is 90% stenosed.   2nd Diag lesion is 90% stenosed.   Mid LAD-1 lesion is 10% stenosed.   Mid LAD-2 lesion is 50% stenosed.   RPAV-1 lesion is 90% stenosed.   There is moderate to severe left ventricular systolic dysfunction.   The left ventricular ejection fraction is 25-35% by visual estimate.  1.  NSTEMI 2.  Three-vessel coronary artery disease 90% stenosis D1, 90% stenosis D2, 90% stenosis OM1, 90% stenosis RPAV 1 and 2, 75% stenosis RPDA, without  obvious culprit vessel, not ideal for percutaneous revascularization or surgical revascularization 3.  Moderate  to severe dilated cardiomyopathy, with LVEF 30%, with multiple regional wall motion abnormalities Had a cardiac catheterization done 10/16/15 and showed an EF of 15% along with multi-vessel disease.   Return in 6 months, sooner if needed.

## 2023-07-11 ENCOUNTER — Encounter: Payer: Self-pay | Admitting: Family

## 2023-07-11 ENCOUNTER — Encounter: Payer: MEDICAID | Admitting: Family

## 2023-07-15 ENCOUNTER — Emergency Department: Payer: MEDICAID

## 2023-07-15 ENCOUNTER — Other Ambulatory Visit: Payer: Self-pay

## 2023-07-15 ENCOUNTER — Observation Stay
Admission: EM | Admit: 2023-07-15 | Discharge: 2023-07-16 | Disposition: A | Discharge status: Home / Self Care | Payer: MEDICAID | Attending: Internal Medicine | Admitting: Internal Medicine

## 2023-07-15 DIAGNOSIS — I639 Cerebral infarction, unspecified: Secondary | ICD-10-CM | POA: Diagnosis present

## 2023-07-15 DIAGNOSIS — E785 Hyperlipidemia, unspecified: Secondary | ICD-10-CM | POA: Diagnosis not present

## 2023-07-15 DIAGNOSIS — I5A Non-ischemic myocardial injury (non-traumatic): Secondary | ICD-10-CM | POA: Insufficient documentation

## 2023-07-15 DIAGNOSIS — Z8673 Personal history of transient ischemic attack (TIA), and cerebral infarction without residual deficits: Secondary | ICD-10-CM | POA: Diagnosis not present

## 2023-07-15 DIAGNOSIS — I110 Hypertensive heart disease with heart failure: Secondary | ICD-10-CM | POA: Diagnosis not present

## 2023-07-15 DIAGNOSIS — Z7901 Long term (current) use of anticoagulants: Secondary | ICD-10-CM | POA: Insufficient documentation

## 2023-07-15 DIAGNOSIS — R7989 Other specified abnormal findings of blood chemistry: Secondary | ICD-10-CM | POA: Insufficient documentation

## 2023-07-15 DIAGNOSIS — R0789 Other chest pain: Secondary | ICD-10-CM | POA: Diagnosis present

## 2023-07-15 DIAGNOSIS — I2510 Atherosclerotic heart disease of native coronary artery without angina pectoris: Secondary | ICD-10-CM | POA: Diagnosis not present

## 2023-07-15 DIAGNOSIS — I10 Essential (primary) hypertension: Secondary | ICD-10-CM | POA: Diagnosis present

## 2023-07-15 DIAGNOSIS — I5022 Chronic systolic (congestive) heart failure: Secondary | ICD-10-CM | POA: Diagnosis present

## 2023-07-15 DIAGNOSIS — D72829 Elevated white blood cell count, unspecified: Secondary | ICD-10-CM | POA: Diagnosis present

## 2023-07-15 DIAGNOSIS — E669 Obesity, unspecified: Secondary | ICD-10-CM | POA: Insufficient documentation

## 2023-07-15 DIAGNOSIS — Z79899 Other long term (current) drug therapy: Secondary | ICD-10-CM | POA: Diagnosis not present

## 2023-07-15 DIAGNOSIS — I4891 Unspecified atrial fibrillation: Principal | ICD-10-CM | POA: Diagnosis present

## 2023-07-15 DIAGNOSIS — Z6836 Body mass index (BMI) 36.0-36.9, adult: Secondary | ICD-10-CM | POA: Diagnosis not present

## 2023-07-15 DIAGNOSIS — E66812 Obesity, class 2: Secondary | ICD-10-CM | POA: Diagnosis present

## 2023-07-15 LAB — URINE DRUG SCREEN, QUALITATIVE (ARMC ONLY)
Amphetamines, Ur Screen: NOT DETECTED
Barbiturates, Ur Screen: NOT DETECTED
Benzodiazepine, Ur Scrn: NOT DETECTED
Cannabinoid 50 Ng, Ur ~~LOC~~: NOT DETECTED
Cocaine Metabolite,Ur ~~LOC~~: NOT DETECTED
MDMA (Ecstasy)Ur Screen: NOT DETECTED
Methadone Scn, Ur: NOT DETECTED
Opiate, Ur Screen: POSITIVE — AB
Phencyclidine (PCP) Ur S: NOT DETECTED
Tricyclic, Ur Screen: POSITIVE — AB

## 2023-07-15 LAB — CBC
HCT: 45.6 % (ref 39.0–52.0)
Hemoglobin: 15.5 g/dL (ref 13.0–17.0)
MCH: 31.9 pg (ref 26.0–34.0)
MCHC: 34 g/dL (ref 30.0–36.0)
MCV: 93.8 fL (ref 80.0–100.0)
Platelets: 416 10*3/uL — ABNORMAL HIGH (ref 150–400)
RBC: 4.86 MIL/uL (ref 4.22–5.81)
RDW: 12.8 % (ref 11.5–15.5)
WBC: 11.1 10*3/uL — ABNORMAL HIGH (ref 4.0–10.5)
nRBC: 0 % (ref 0.0–0.2)

## 2023-07-15 LAB — BASIC METABOLIC PANEL
Anion gap: 13 (ref 5–15)
BUN: 24 mg/dL — ABNORMAL HIGH (ref 6–20)
CO2: 24 mmol/L (ref 22–32)
Calcium: 8.8 mg/dL — ABNORMAL LOW (ref 8.9–10.3)
Chloride: 101 mmol/L (ref 98–111)
Creatinine, Ser: 0.94 mg/dL (ref 0.61–1.24)
GFR, Estimated: >60 mL/min (ref >60)
Glucose, Bld: 156 mg/dL — ABNORMAL HIGH (ref 70–99)
Potassium: 3.7 mmol/L (ref 3.5–5.1)
Sodium: 138 mmol/L (ref 135–145)

## 2023-07-15 LAB — T4, FREE: Free T4: 0.7 ng/dL (ref 0.61–1.12)

## 2023-07-15 LAB — PROTIME-INR
INR: 1 (ref 0.8–1.2)
Prothrombin Time: 13.1 s (ref 11.4–15.2)

## 2023-07-15 LAB — APTT: aPTT: 26 s (ref 24–36)

## 2023-07-15 LAB — TROPONIN I (HIGH SENSITIVITY)
Troponin I (High Sensitivity): 28 ng/L — ABNORMAL HIGH (ref <18)
Troponin I (High Sensitivity): 28 ng/L — ABNORMAL HIGH (ref <18)

## 2023-07-15 LAB — TSH: TSH: 1.059 u[IU]/mL (ref 0.350–4.500)

## 2023-07-15 LAB — BRAIN NATRIURETIC PEPTIDE: B Natriuretic Peptide: 245.3 pg/mL — ABNORMAL HIGH (ref 0.0–100.0)

## 2023-07-15 LAB — MAGNESIUM: Magnesium: 2.1 mg/dL (ref 1.7–2.4)

## 2023-07-15 LAB — PROCALCITONIN: Procalcitonin: <0.1 ng/mL

## 2023-07-15 MED ORDER — ACETAMINOPHEN 325 MG PO TABS: 650.0000 mg | ORAL_TABLET | Freq: Four times a day (QID) | ORAL | Status: DC | PRN

## 2023-07-15 MED ORDER — SPIRONOLACTONE 25 MG PO TABS
25.0000 mg | ORAL_TABLET | Freq: Every day | ORAL | Status: DC
  Administered 2023-07-16: 25 mg via ORAL
  Filled 2023-07-15: qty 1

## 2023-07-15 MED ORDER — DILTIAZEM HCL-DEXTROSE 125-5 MG/125ML-% IV SOLN (PREMIX)
5.0000 mg/h | INTRAVENOUS | Status: DC
  Administered 2023-07-15: 5 mg/h via INTRAVENOUS
  Filled 2023-07-15 (×2): qty 125

## 2023-07-15 MED ORDER — MORPHINE SULFATE (PF) 4 MG/ML IV SOLN
4.0000 mg | Freq: Once | INTRAVENOUS | Status: AC
  Administered 2023-07-15: 4 mg via INTRAVENOUS
  Filled 2023-07-15: qty 1

## 2023-07-15 MED ORDER — MORPHINE SULFATE (PF) 2 MG/ML IV SOLN
2.0000 mg | INTRAVENOUS | Status: DC | PRN
  Administered 2023-07-15 – 2023-07-16 (×2): 2 mg via INTRAVENOUS
  Filled 2023-07-15 (×2): qty 1

## 2023-07-15 MED ORDER — HEPARIN (PORCINE) 25000 UT/250ML-% IV SOLN
1750.0000 [IU]/h | INTRAVENOUS | Status: DC
  Administered 2023-07-15: 1400 [IU]/h via INTRAVENOUS
  Filled 2023-07-15 (×2): qty 250

## 2023-07-15 MED ORDER — ALBUTEROL SULFATE (2.5 MG/3ML) 0.083% IN NEBU: 2.5000 mg | INHALATION_SOLUTION | RESPIRATORY_TRACT | Status: DC | PRN

## 2023-07-15 MED ORDER — FUROSEMIDE 10 MG/ML IJ SOLN: 40.0000 mg | Freq: Every day | INTRAMUSCULAR | Status: DC

## 2023-07-15 MED ORDER — ASPIRIN 81 MG PO TBEC
81.0000 mg | DELAYED_RELEASE_TABLET | Freq: Every day | ORAL | Status: DC
  Administered 2023-07-16: 81 mg via ORAL
  Filled 2023-07-15: qty 1

## 2023-07-15 MED ORDER — ATORVASTATIN CALCIUM 20 MG PO TABS
80.0000 mg | ORAL_TABLET | Freq: Every day | ORAL | Status: DC
  Administered 2023-07-16: 80 mg via ORAL
  Filled 2023-07-15: qty 4

## 2023-07-15 MED ORDER — HEPARIN BOLUS VIA INFUSION
5200.0000 [IU] | Freq: Once | INTRAVENOUS | Status: AC
  Administered 2023-07-15: 5200 [IU] via INTRAVENOUS
  Filled 2023-07-15: qty 5200

## 2023-07-15 MED ORDER — POTASSIUM CHLORIDE CRYS ER 20 MEQ PO TBCR
40.0000 meq | EXTENDED_RELEASE_TABLET | Freq: Once | ORAL | Status: AC
  Administered 2023-07-15: 40 meq via ORAL
  Filled 2023-07-15: qty 2

## 2023-07-15 MED ORDER — ONDANSETRON HCL 4 MG/2ML IJ SOLN: 4.0000 mg | Freq: Three times a day (TID) | INTRAMUSCULAR | Status: DC | PRN

## 2023-07-15 MED ORDER — DM-GUAIFENESIN ER 30-600 MG PO TB12: 1.0000 | ORAL_TABLET | Freq: Two times a day (BID) | ORAL | Status: DC | PRN

## 2023-07-15 MED ORDER — CYCLOBENZAPRINE HCL 10 MG PO TABS
10.0000 mg | ORAL_TABLET | Freq: Every evening | ORAL | Status: DC | PRN
  Administered 2023-07-16: 10 mg via ORAL
  Filled 2023-07-15: qty 1

## 2023-07-15 MED ORDER — FUROSEMIDE 10 MG/ML IJ SOLN
40.0000 mg | Freq: Two times a day (BID) | INTRAMUSCULAR | Status: DC
  Administered 2023-07-15 – 2023-07-16 (×2): 40 mg via INTRAVENOUS
  Filled 2023-07-15 (×2): qty 4

## 2023-07-15 MED ORDER — HYDRALAZINE HCL 20 MG/ML IJ SOLN: 5.0000 mg | INTRAMUSCULAR | Status: DC | PRN

## 2023-07-15 MED ORDER — NITROGLYCERIN 0.4 MG SL SUBL: 0.4000 mg | SUBLINGUAL_TABLET | SUBLINGUAL | Status: DC | PRN

## 2023-07-15 MED ORDER — ONDANSETRON HCL 4 MG/2ML IJ SOLN
4.0000 mg | Freq: Once | INTRAMUSCULAR | Status: AC
  Administered 2023-07-15: 4 mg via INTRAVENOUS
  Filled 2023-07-15: qty 2

## 2023-07-15 MED ORDER — METOPROLOL SUCCINATE ER 25 MG PO TB24: 25.0000 mg | ORAL_TABLET | Freq: Every day | ORAL | Status: DC

## 2023-07-15 MED ORDER — ISOSORBIDE MONONITRATE ER 60 MG PO TB24
30.0000 mg | ORAL_TABLET | Freq: Every day | ORAL | Status: DC
Start: 2023-07-16 — End: 2023-07-16
  Administered 2023-07-16: 30 mg via ORAL
  Filled 2023-07-15: qty 1

## 2023-07-15 MED ORDER — DILTIAZEM HCL 25 MG/5ML IV SOLN
10.0000 mg | Freq: Once | INTRAVENOUS | Status: AC
  Administered 2023-07-15: 10 mg via INTRAVENOUS
  Filled 2023-07-15: qty 5

## 2023-07-15 MED ORDER — GABAPENTIN 100 MG PO CAPS
100.0000 mg | ORAL_CAPSULE | Freq: Three times a day (TID) | ORAL | Status: DC
  Administered 2023-07-16 (×2): 100 mg via ORAL
  Filled 2023-07-15 (×2): qty 1

## 2023-07-15 NOTE — Progress Notes (Deleted)
PCP: [facility] (last seen 08/24) Primary Cardiologist: Leanora Ivanoff, PA (last seen 09/24)  HPI:  [name] is a 58 y/o male with a history of obstructive sleep apnea (without CPAP), CAD, COPD, HTN, GERD, MI, current tobacco use and chronic heart failure.   Was in the ED 09/06/22 due to cough and right sided chest pain thought to be due to COPD exacerbation. Was in the ED 05/16/23 due to viral illness.   Echo 10/15/15: EF of 35% along with moderate MR/TR and an elevated PA pressure of 48 mm Hg.  Echo 05/24/2019: EF of 30-35% along with trivial TR/PR. Echo 10/22/20: EF of 25-30% along with mild MR/AR. Echo 02/15/22: EF of 35-40% along with mild MR.     LHC done 02/15/22:    RPAV-2 lesion is 90% stenosed.   RPDA lesion is 75% stenosed.   2nd RPL lesion is 80% stenosed.   1st Mrg lesion is 90% stenosed.   1st Diag lesion is 90% stenosed.   2nd Diag lesion is 90% stenosed.   Mid LAD-1 lesion is 10% stenosed.   Mid LAD-2 lesion is 50% stenosed.   RPAV-1 lesion is 90% stenosed.   There is moderate to severe left ventricular systolic dysfunction.   The left ventricular ejection fraction is 25-35% by visual estimate.  1.  NSTEMI 2.  Three-vessel coronary artery disease 90% stenosis D1, 90% stenosis D2, 90% stenosis OM1, 90% stenosis RPAV 1 and 2, 75% stenosis RPDA, without obvious culprit vessel, not ideal for percutaneous revascularization or surgical revascularization 3.  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 Recommended treatment for cardiomyopathy.     He presents today for a HF follow-up visit with a chief complaint of     ROS: All systems negative except as listed in HPI, PMH and Problem List.  SH:  Social History   Socioeconomic History   Marital status: Single    Spouse name: Not on file   Number of  children: Not on file   Years of education: Not on file   Highest education level: Not on file  Occupational History   Occupation: unemployed  Tobacco Use   Smoking status: Former    Current packs/day: 0.00    Types: Cigarettes    Quit date: 10/24/2020    Years since quitting: 2.7   Smokeless tobacco: Never  Vaping Use   Vaping status: Never Used  Substance and Sexual Activity   Alcohol use: No    Alcohol/week: 0.0 standard drinks of alcohol   Drug use: Not Currently    Comment: percocet   Sexual activity: Not Currently  Other Topics Concern   Not on file  Social History Narrative   Not on file   Social Drivers of Health   Financial Resource Strain: Low Risk  (09/12/2022)   Overall Financial Resource Strain (CARDIA)    Difficulty of Paying Living Expenses: Not very hard  Food Insecurity: No Food Insecurity (09/12/2022)   Hunger Vital Sign    Worried About Running Out of Food in the Last Year: Never true    Ran Out of Food in the Last Year: Never true  Transportation Needs: No Transportation Needs (09/12/2022)   PRAPARE - Administrator, Civil Service (Medical): No    Lack of Transportation (Non-Medical): No  Physical Activity: Sufficiently Active (09/12/2022)   Exercise Vital Sign  Days of Exercise per Week: 7 days    Minutes of Exercise per Session: 30 min  Stress: No Stress Concern Present (09/12/2022)   [employer] of Occupational Health - Occupational Stress Questionnaire    Feeling of Stress : Not at all  Social Connections: Moderately Integrated (09/12/2022)   Social Connection and Isolation Panel [NHANES]    Frequency of Communication with Friends and Family: More than three times a week    Frequency of Social Gatherings with Friends and Family: Once a week    Attends Religious Services: More than 4 times per year    Active Member of [employer] or Organizations: No    Attends Banker Meetings: Never    Marital Status: Living with partner   Intimate Partner Violence: Not At Risk (09/12/2022)   Humiliation, Afraid, Rape, and Kick questionnaire    Fear of Current or Ex-Partner: No    Emotionally Abused: No    Physically Abused: No    Sexually Abused: No    FH:  Family History  Problem Relation Age of Onset   Hypertension Mother    Other Mother        covid pneumonia   Congestive Heart Failure Mother    Stroke Father    Heart attack Father 32   Diabetes Father    Hypertension Father    Diabetes Mellitus II Brother    Alzheimer's disease Maternal Grandmother    Heart attack Maternal Grandfather    Cancer Paternal Grandmother    Stroke Paternal Grandfather     Past Medical History:  Diagnosis Date   CAD (coronary artery disease)    CHF (congestive heart failure) (HCC)    GERD (gastroesophageal reflux disease)    Hypertension    MI, old    Sleep apnea     Current Outpatient Medications  Medication Sig Dispense Refill   acetaminophen (TYLENOL) 650 MG CR tablet Take 650 mg by mouth every 8 (eight) hours as needed for pain.     albuterol (VENTOLIN HFA) 108 (90 Base) MCG/ACT inhaler Inhale 2-4 puffs by mouth every 4 hours as needed for wheezing, cough, and/or shortness of breath 6.7 g 0   aspirin 81 MG EC tablet Take 1 tablet (81 mg total) by mouth daily. 30 tablet 2   atorvastatin (LIPITOR) 80 MG tablet Take 1 tablet (80 mg total) by mouth daily. 90 tablet 1   azithromycin (ZITHROMAX Z-PAK) 250 MG tablet Take 2 tablets by mouth on day 1, and one tablet by mouth on days 2-5 6 each 0   chlorpheniramine-HYDROcodone (TUSSIONEX) 10-8 MG/5ML Take 5 mLs by mouth every 12 (twelve) hours as needed for cough. 115 mL 0   furosemide (LASIX) 40 MG tablet Take 1 tablet (40 mg total) by mouth daily. 90 tablet 1   gabapentin (NEURONTIN) 100 MG capsule Take 1 capsule (100 mg total) by mouth 3 (three) times daily. (Patient not taking: Reported on 01/03/2023) 90 capsule 0   metoprolol succinate (TOPROL-XL) 25 MG 24 hr tablet Take 1  tablet (25 mg total) by mouth daily. 90 tablet 3   potassium chloride (KLOR-CON M) 10 MEQ tablet Take 1 tablet (10 mEq total) by mouth daily. 90 tablet 3   ranolazine (RANEXA) 500 MG 12 hr tablet Take 1 tablet (500 mg total) by mouth 2 (two) times daily. 60 tablet 0   spironolactone (ALDACTONE) 25 MG tablet Take 1 tablet (25 mg total) by mouth daily. 90 tablet 3   No current facility-administered medications  for this visit.     PHYSICAL EXAM:  General:  Well appearing. No resp difficulty HEENT: normal Neck: supple. JVP flat. No lymphadenopathy or thryomegaly appreciated. Cor: PMI normal. Regular rate & rhythm. No rubs, gallops or murmurs. Lungs: clear Abdomen: soft, nontender, nondistended. No hepatosplenomegaly. No bruits or masses.  Extremities: no cyanosis, clubbing, rash, trace pitting edema bilateral lower legs Neuro: alert & orientedx3, cranial nerves grossly intact. Moves all 4 extremities w/o difficulty. Affect pleasant.   ECG: not done   ASSESSMENT & PLAN:  1: Chronic heart failure with reduced ejection fraction- - suspect due to HTN/ CAD - NYHA class II - euvolemic in appearance  - weighing daily & he says that this weight gain has been gradual; Reminded to call for an overnight weight gain of >2 pounds or a weekly weight gain of >5 pounds.  - weight 252 pounds since he was last here 6 months ago -  Echo 10/15/15: EF of 35% along with moderate MR/TR and an elevated PA pressure of 48 mm Hg.  - Echo 05/24/2019: EF of 30-35% along with trivial TR/PR. - Echo 10/22/20: EF of 25-30% along with mild MR/AR. - Echo 02/15/22: EF of 35-40% along with mild MR.  - continue metoprolol succinate 25mg  daily - continue spironolactone 25mg  daily - continue furosemide 40mg  daily/ daily - unable to tolerate entresto or jardiance; tolerated farxiga but wasn't approved for patient assistance and he can't afford to take it - has been adding a little salt and admits that he could be  doing better with monitoring it; encouraged to not add any salt to his food - wearing compression socks daily - BNP 12/14/21 was 28.0  2: HTN- - BP  - saw provider at [facility] 08/24 - BMP 05/13/23 showed sodium 140, potassium 3.5, creatinine 0.88 and GFR >60  3: CAD- - saw cardiology Mellissa Kohut) 09/24 - continue atorvastatin 80mg  - LHC done 02/15/22:    RPAV-2 lesion is 90% stenosed.   RPDA lesion is 75% stenosed.   2nd RPL lesion is 80% stenosed.   1st Mrg lesion is 90% stenosed.   1st Diag lesion is 90% stenosed.   2nd Diag lesion is 90% stenosed.   Mid LAD-1 lesion is 10% stenosed.   Mid LAD-2 lesion is 50% stenosed.   RPAV-1 lesion is 90% stenosed.   There is moderate to severe left ventricular systolic dysfunction.   The left ventricular ejection fraction is 25-35% by visual estimate.  1.  NSTEMI 2.  Three-vessel coronary artery disease 90% stenosis D1, 90% stenosis D2, 90% stenosis OM1, 90% stenosis RPAV 1 and 2, 75% stenosis RPDA, without obvious culprit vessel, not ideal for percutaneous revascularization or surgical revascularization 3.  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

## 2023-07-15 NOTE — ED Provider Triage Note (Signed)
 Emergency Medicine Provider Triage Evaluation Note  Brian Porter , a 58 y.o. male  was evaluated in triage.  Pt complains of chest pain, described as a pressure, shortness of breath,, pain is not related with exercise.  Review of Systems  Positive:  Negative:   Physical Exam  BP 109/81   Pulse (!) 144   Temp 97.7 F (36.5 C)   Resp (!) 21   SpO2 95%  Gen:   Awake, no distress , tachycardic, tachypneic Resp:  Normal effort  MSK:   Moves extremities without difficulty  Other:    Medical Decision Making  Medically screening exam initiated at 5:44 PM.  Appropriate orders placed.  [facility] was informed that the remainder of the evaluation will be completed by another provider, this initial triage assessment does not replace that evaluation, and the importance of remaining in the ED until their evaluation is complete.  Patient with chest pain, EKG shows SVT, ordered labs,   Janit Kast, PA-C 07/15/23 1745

## 2023-07-15 NOTE — Progress Notes (Signed)
 PHARMACY - ANTICOAGULATION CONSULT NOTE  Pharmacy Consult for heparin  Indication: atrial fibrillation  Allergies  Allergen Reactions   Entresto  [Sacubitril -Valsartan ] Cough   Jardiance  [Empagliflozin ] Diarrhea    Patient Measurements: Height: 5' 9 (175.3 cm) Weight: 113.4 kg (250 lb) IBW/kg (Calculated) : 70.7 Heparin  Dosing Weight: 95.9 kg  Vital Signs: Temp: 97.7 F (36.5 C) (01/14 1741) BP: 125/86 (01/14 1900) Pulse Rate: 57 (01/14 1900)  Labs: Recent Labs    07/15/23 1748  HGB 15.5  HCT 45.6  PLT 416*  CREATININE 0.94  TROPONINIHS 28*    Estimated Creatinine Clearance: 106.4 mL/min (by C-G formula based on SCr of 0.94 mg/dL).   Medical History: Past Medical History:  Diagnosis Date   CAD (coronary artery disease)    CHF (congestive heart failure) (HCC)    GERD (gastroesophageal reflux disease)    Hypertension    MI, old    Sleep apnea    Assessment: 58 y/o male presenting with chest pain, found to be in new onset atrial fibrillation with RVR. PMH significant for CAD s/p PCI with 1 stent to LAD (2014), hypertension, gastric reflux, and CHF (EF 35-40% 01/2022). Pharmacy has been consulted to initiate heparin  infusion. Per chart review, patient is not on anticoagulation prior to admission.  Baseline labs: hgb 15.5, plt 416, INR ordered.  Goal of Therapy:  Heparin  level 0.3-0.7 units/ml Monitor platelets by anticoagulation protocol: Yes   Plan:  Give 5200 units bolus x 1 Start heparin  infusion at 1400 units/hr Check anti-Xa level in 6 hours and daily while on heparin  Continue to monitor H&H and platelets  Thank you for involving pharmacy in this patient's care.   Damien Napoleon, PharmD Clinical Pharmacist 07/15/2023 8:26 PM

## 2023-07-15 NOTE — ED Triage Notes (Signed)
 Pt to ED for chest discomfort started a couple days ago. +shob

## 2023-07-15 NOTE — H&P (Signed)
 History and Physical    [facility] FMW:[id number] DOB: 1965/11/07 DOA: 07/15/2023  Referring MD/NP/PA:   PCP: [facility]   Patient coming from:  The patient is coming from home.     Chief Complaint: chest pain  HPI: Brian Porter is a 58 y.o. male with medical history significant of HTN, HLD, pre-DM, PVD, CAD with stent, sCHF with EF30-40%, stroke, obesity, OSA, who presents with chest pain  Patient states that he has intermittent chest pain and chest discomfort for about 3 days.  The chest pain is located in substernal area, pressure-like, mild to moderate, nonradiating, not aggravated or alleviated by any known factors.  Patient has dizziness intermittently. Pt has mild shortness of breath, no cough, fever or chills.  Denies palpitation.  Patient does not have nausea, vomiting, diarrhea or abdominal pain, but reports that he needs to have 2 or 3 times of bowel movement each day, but not diarrhea.  Patient denies any rectal bleeding or dark stool.  No recent fall or head injury.  Patient was found to have new onset A-fib with heart rate up to 140s.  Cardizem  drip is started in ED.  Data reviewed independently and ED Course: pt was found to have troponin 28, WBC 11.1, GFR > 60, temperature normal, blood pressure 125/86, RR 21, 15, oxygen saturation 97% on room air.  Patient is placed in PCU for observation.  Dr. Wilburn of cardiology is consulted.   EKG: I have personally reviewed.  A-fib with RVR, QTc 470, low voltage, poor R wave progression.   Review of Systems:   General: no fevers, chills, no body weight gain, has fatigue HEENT: no blurry vision, hearing changes or sore throat Respiratory: has dyspnea, no coughing, wheezing CV: has chest pain, no palpitations GI: no nausea, vomiting, abdominal pain, diarrhea, constipation GU: no dysuria, burning on urination, increased urinary frequency, hematuria  Ext: has leg edema Neuro: no unilateral weakness, numbness, or  tingling, no vision change or hearing loss Skin: no rash, no skin tear. MSK: No muscle spasm, no deformity, no limitation of range of movement in spin Heme: No easy bruising.  Travel history: No recent long distant travel.   Allergy:  Allergies  Allergen Reactions   Entresto  [Sacubitril -Valsartan ] Cough   Jardiance  [Empagliflozin ] Diarrhea    Past Medical History:  Diagnosis Date   CAD (coronary artery disease)    CHF (congestive heart failure) (HCC)    GERD (gastroesophageal reflux disease)    Hypertension    MI, old    Sleep apnea     Past Surgical History:  Procedure Laterality Date   CARDIAC CATHETERIZATION Right 10/16/2015   Procedure: Left Heart Cath and Coronary Angiography;  Surgeon: Denyse DELENA Bathe, MD;  Location: [facility] INVASIVE CV LAB;  Service: Cardiovascular;  Laterality: Right;   CORONARY ANGIOPLASTY WITH STENT PLACEMENT  approx 2 years ago   HERNIA REPAIR Left 11/29/2016   [facility]   LEFT HEART CATH AND CORONARY ANGIOGRAPHY N/A 02/15/2022   Procedure: LEFT HEART CATH AND CORONARY ANGIOGRAPHY;  Surgeon: Ammon Blunt, MD;  Location: [facility] INVASIVE CV LAB;  Service: Cardiovascular;  Laterality: N/A;  12:00 PM   PARTIAL NEPHRECTOMY Left    SPLENECTOMY      Social History:  reports that he quit smoking about 2 years ago. His smoking use included cigarettes. He has never used smokeless tobacco. He reports that he does not currently use drugs. He reports that he does not drink alcohol.  Family History:  Family  History  Problem Relation Age of Onset   Hypertension Mother    Other Mother        covid pneumonia   Congestive Heart Failure Mother    Stroke Father    Heart attack Father 62   Diabetes Father    Hypertension Father    Diabetes Mellitus II Brother    Alzheimer's disease Maternal Grandmother    Heart attack Maternal Grandfather    Cancer Paternal Grandmother    Stroke Paternal Grandfather      Prior to Admission medications   Medication Sig Start  Date End Date Taking? Authorizing Provider  acetaminophen  (TYLENOL ) 650 MG CR tablet Take 650 mg by mouth every 8 (eight) hours as needed for pain.    [provider]  albuterol  (VENTOLIN  HFA) 108 (90 Base) MCG/ACT inhaler Inhale 2-4 puffs by mouth every 4 hours as needed for wheezing, cough, and/or shortness of breath 05/13/23   Gordan Huxley, MD  aspirin  81 MG EC tablet Take 1 tablet (81 mg total) by mouth daily. 10/23/20   Elgergawy, Brayton RAMAN, MD  atorvastatin  (LIPITOR ) 80 MG tablet Take 1 tablet (80 mg total) by mouth daily. 02/16/22   Wouk, Devaughn Sayres, MD  azithromycin  (ZITHROMAX  Z-PAK) 250 MG tablet Take 2 tablets by mouth on day 1, and one tablet by mouth on days 2-5 05/13/23   Poggi, Jenna E, PA-C  chlorpheniramine-HYDROcodone  (TUSSIONEX) 10-8 MG/5ML Take 5 mLs by mouth every 12 (twelve) hours as needed for cough. 05/13/23   Gordan Huxley, MD  furosemide  (LASIX ) 40 MG tablet Take 1 tablet (40 mg total) by mouth daily. 05/09/23   Donette Ellouise LABOR, FNP  gabapentin  (NEURONTIN ) 100 MG capsule Take 1 capsule (100 mg total) by mouth 3 (three) times daily. Patient not taking: Reported on 01/03/2023 11/28/22   Iloabachie, Chioma E, NP  metoprolol  succinate (TOPROL -XL) 25 MG 24 hr tablet Take 1 tablet (25 mg total) by mouth daily. 12/02/22   Donette Ellouise LABOR, FNP  potassium chloride  (KLOR-CON  M) 10 MEQ tablet Take 1 tablet (10 mEq total) by mouth daily. 05/01/23   Donette Ellouise LABOR, FNP  ranolazine  (RANEXA ) 500 MG 12 hr tablet Take 1 tablet (500 mg total) by mouth 2 (two) times daily. 03/01/22   Amin, Ankit C, MD  spironolactone  (ALDACTONE ) 25 MG tablet Take 1 tablet (25 mg total) by mouth daily. 06/11/22   Iloabachie, Chioma E, NP    Physical Exam: Vitals:   07/15/23 1900 07/15/23 1930 07/15/23 2030 07/15/23 2100  BP: 125/86 (!) 117/91 (!) 123/95 118/86  Pulse: (!) 57     Resp: 15 (!) 24 14 17   Temp:      SpO2: 97% 100% 96% 94%  Weight:      Height:       General: Not in acute  distress HEENT:       Eyes: PERRL, EOMI, no jaundice       ENT: No discharge from the ears and nose, no pharynx injection, no tonsillar enlargement.        Neck: No JVD, no bruit, no mass felt. Heme: No neck lymph node enlargement. Cardiac: S1/S2, irregularly irregular rhythm, no murmurs, No gallops or rubs. Respiratory: No rales, wheezing, rhonchi or rubs. GI: Soft, nondistended, nontender, no rebound pain, no organomegaly, BS present. GU: No hematuria Ext: 2+ pitting leg edema bilaterally. 1+DP/PT pulse bilaterally. Musculoskeletal: No joint deformities, No joint redness or warmth, no limitation of ROM in spin. Skin: No rashes.  Neuro: Alert, oriented X3,  cranial nerves II-XII grossly intact, moves all extremities normally.  Psych: Patient is not psychotic, no suicidal or hemocidal ideation.  Labs on Admission: I have personally reviewed following labs and imaging studies  CBC: Recent Labs  Lab 07/15/23 1748  WBC 11.1*  HGB 15.5  HCT 45.6  MCV 93.8  PLT 416*   Basic Metabolic Panel: Recent Labs  Lab 07/15/23 1748 07/15/23 1954  NA 138  --   K 3.7  --   CL 101  --   CO2 24  --   GLUCOSE 156*  --   BUN 24*  --   CREATININE 0.94  --   CALCIUM  8.8*  --   MG  --  2.1   GFR: Estimated Creatinine Clearance: 106.4 mL/min (by C-G formula based on SCr of 0.94 mg/dL). Liver Function Tests: No results for input(s): AST, ALT, ALKPHOS, BILITOT, PROT, ALBUMIN in the last 168 hours. No results for input(s): LIPASE, AMYLASE in the last 168 hours. No results for input(s): AMMONIA in the last 168 hours. Coagulation Profile: Recent Labs  Lab 07/15/23 1953  INR 1.0   Cardiac Enzymes: No results for input(s): CKTOTAL, CKMB, CKMBINDEX, TROPONINI in the last 168 hours. BNP (last 3 results) No results for input(s): PROBNP in the last 8760 hours. HbA1C: No results for input(s): HGBA1C in the last 72 hours. CBG: No results for input(s): GLUCAP in  the last 168 hours. Lipid Profile: No results for input(s): CHOL, HDL, LDLCALC, TRIG, CHOLHDL, LDLDIRECT in the last 72 hours. Thyroid  Function Tests: Recent Labs    07/15/23 1954  TSH 1.059  FREET4 0.70   Anemia Panel: No results for input(s): VITAMINB12, FOLATE, FERRITIN, TIBC, IRON, RETICCTPCT in the last 72 hours. Urine analysis:    Component Value Date/Time   COLORURINE YELLOW 03/04/2023 1306   APPEARANCEUR CLEAR 03/04/2023 1306   LABSPEC 1.020 03/04/2023 1306   PHURINE 7.0 03/04/2023 1306   GLUCOSEU NEGATIVE 03/04/2023 1306   HGBUR NEGATIVE 03/04/2023 1306   BILIRUBINUR NEGATIVE 03/04/2023 1306   KETONESUR NEGATIVE 03/04/2023 1306   PROTEINUR NEGATIVE 03/04/2023 1306   UROBILINOGEN 1.0 12/10/2014 2035   NITRITE NEGATIVE 03/04/2023 1306   LEUKOCYTESUR NEGATIVE 03/04/2023 1306   Sepsis Labs: @LABRCNTIP (procalcitonin:4,lacticidven:4) )No results found for this or any previous visit (from the past 240 hours).   Radiological Exams on Admission:   Assessment/Plan Principal Problem:   Atrial fibrillation with RVR (HCC) Active Problems:   Myocardial injury   CAD (coronary artery disease)   Chronic systolic CHF (congestive heart failure) (HCC)   HTN (hypertension)   Dyslipidemia   CVA (cerebral vascular accident) (HCC)   Leukocytosis   Obesity (BMI 30-39.9)   Assessment and Plan:  New onset atrial fibrillation with RVR (HCC): HR up to 140s. CHA2DS2-VASc Score is 5, will need chronic anticoagulants.  -Place in PCU for observation -Continue Cardizem  drip (patient also received 10 mg Cardizem  IV in ED) -Continue home metoprolol  -Started IV heparin  -Check TSH, free T4 -Give 40 mEq of potassium (his potassium level 3.7), -Check magnesium  level --> 2.1 -Consulted Dr. Wilburn of cardiology  Myocardial injury and hx of CAD (coronary artery disease): Troponin level 28 -On IV heparin  as above -As needed nitroglycerin , morphine  for  pain -Imdur  -Aspirin  -Restart his Lipitor   Chronic systolic CHF (congestive heart failure) (HCC): 2D echo on 02/15/2022 showed EF 35-40% with grade 1 diastolic dysfunction.  BNP 245.  Has 2+ leg edema, indicating some fluid overload.  No oxygen desaturation. -Start Lasix  40 mg twice daily -  Continue spironolactone   HTN (hypertension) -IV hydralazine  as needed -Metoprolol , Imdur   Dyslipidemia: Patient said he is not taking Lipitor  currently -Restarted Lipitor   History of CVA (cerebral vascular accident) (HCC) -Aspirin , Lipitor   Leukocytosis: WBC 11.1.  Chest x-ray showed bilateral basilar patchy opacity, but the patient does not have fever, procalcitonin < 0.10.  Clinically does not seem to have pneumonia.  Patchy opacity is possibly due to CHF. -Hold off antibiotics.  Obesity (BMI 30-39.9): Body weight 113.4 kg, BMI 36.92 -Encouraged losing weight -Exercise and healthy diet    DVT ppx: on IV Heparin     Code Status: Full code     Family Communication: not done, no family member is at bed side.      Disposition Plan:  Anticipate discharge back to previous environment  Consults called:    Dr. Wilburn of cardiology is consulted.  Admission status and Level of care: Progressive:    for obs as inpt        Dispo: The patient is from: Home              Anticipated d/c is to: Home              Anticipated d/c date is: 1 day              Patient currently is not medically stable to d/c.    Severity of Illness:  The appropriate patient status for this patient is OBSERVATION. Observation status is judged to be reasonable and necessary in order to provide the required intensity of service to ensure the patient's safety. The patient's presenting symptoms, physical exam findings, and initial radiographic and laboratory data in the context of their medical condition is felt to place them at decreased risk for further clinical deterioration. Furthermore, it is anticipated that the  patient will be medically stable for discharge from the hospital within 2 midnights of admission.        Date of Service 07/15/2023    Caleb Exon [facility]   If 7PM-7AM, please contact night-coverage [email or web address] 07/15/2023, 9:21 PM

## 2023-07-15 NOTE — ED Provider Notes (Signed)
 [facility] Provider Note    Event Date/Time   First MD Initiated Contact with Patient 07/15/23 1756     (approximate)  History   Chief Complaint: Chest Pain  HPI  Brian Porter is a 58 y.o. male with a past medical history of CAD status post 1 stent, hypertension, gastric reflux, CHF, presents to the emergency department for chest pain.  According to the patient for the last few days he has been experiencing chest discomfort he describes more as a tightness in the center of his chest.  Patient states he has been feeling more short of breath at times as well so he came to the hospital for evaluation.  Patient found to be tachycardic around 145 bpm.  Denies any nausea or diaphoresis.  Physical Exam   Triage Vital Signs: ED Triage Vitals [07/15/23 1741]  Encounter Vitals Group     BP 109/81     Systolic BP Percentile      Diastolic BP Percentile      Pulse Rate (!) 144     Resp (!) 21     Temp 97.7 F (36.5 C)     Temp src      SpO2 95 %     Weight 250 lb (113.4 kg)     Height 5' 9 (1.753 m)     Head Circumference      Peak Flow      Pain Score 5     Pain Loc      Pain Education      Exclude from Growth Chart     Most recent vital signs: Vitals:   07/15/23 1741  BP: 109/81  Pulse: (!) 144  Resp: (!) 21  Temp: 97.7 F (36.5 C)  SpO2: 95%    General: Awake, no distress.  CV:  Good peripheral perfusion.  Regular rhythm, rate around 140 bpm. Resp:  Normal effort.  Equal breath sounds bilaterally.  Abd:  No distention.  Soft, nontender.  No rebound or guarding.  ED Results / Procedures / Treatments   EKG  EKG viewed and interpreted by myself shows supraventricular tachycardia at 144 bpm with a narrow QRS, right axis deviation, largely normal intervals with nonspecific ST changes.  MEDICATIONS ORDERED IN ED: Medications  diltiazem  (CARDIZEM ) injection 10 mg (has no administration in time range)  morphine  (PF) 4 MG/ML injection 4 mg  (has no administration in time range)  ondansetron  (ZOFRAN ) injection 4 mg (has no administration in time range)     IMPRESSION / MDM / ASSESSMENT AND PLAN / ED COURSE  I reviewed the triage vital signs and the nursing notes.  Patient's presentation is most consistent with acute presentation with potential threat to life or bodily function.  Patient presents the emergency department for chest pain intermittent over the last few days as well as increased shortness of breath.  Patient found to be tachycardic around 145 bpm.  Patient unaware that his heart was racing.  Denies any nausea or diaphoresis.  Does state shortness of breath.  Patient's EKG unclear rhythm does not appear consistent with SVT as it will slow to around 130 at times however I do not see any clear fibrillation, could be more indicative of a flutter with a 2-1 block.  We will dose 10 mg of IV diltiazem  we will check labs including cardiac enzymes and continue to closely monitor.  Will obtain a portable chest x-ray as well.  Patient agreeable to plan.  Patient's heart  rate has slowed to approximately 120 bpm.  Repeat EKG confirms atrial fibrillation with rapid ventricular response.  Patient still complaining of chest pain although improved after morphine .  Troponin slightly elevated at 28.  We will start the patient on a diltiazem  infusion and admit to the hospitalist service for further workup and treatment.  Patient agreeable to plan of care.  CRITICAL CARE Performed by: Franky Moores   Total critical care time: 30 minutes  Critical care time was exclusive of separately billable procedures and treating other patients.  Critical care was necessary to treat or prevent imminent or life-threatening deterioration.  Critical care was time spent personally by me on the following activities: development of treatment plan with patient and/or surrogate as well as nursing, discussions with consultants, evaluation of patient's  response to treatment, examination of patient, obtaining history from patient or surrogate, ordering and performing treatments and interventions, ordering and review of laboratory studies, ordering and review of radiographic studies, pulse oximetry and re-evaluation of patient's condition.   FINAL CLINICAL IMPRESSION(S) / ED DIAGNOSES   Atrial fibrillation with rapid ventricular response Chest pain Elevated troponin    Note:  This document was prepared using Dragon voice recognition software and may include unintentional dictation errors.   Moores Franky, MD 07/15/23 [phone]

## 2023-07-16 ENCOUNTER — Encounter: Payer: Self-pay | Admitting: Internal Medicine

## 2023-07-16 ENCOUNTER — Telehealth (HOSPITAL_COMMUNITY): Payer: Self-pay | Admitting: Pharmacy Technician

## 2023-07-16 ENCOUNTER — Other Ambulatory Visit (HOSPITAL_COMMUNITY): Payer: Self-pay

## 2023-07-16 DIAGNOSIS — I10 Essential (primary) hypertension: Secondary | ICD-10-CM | POA: Diagnosis not present

## 2023-07-16 DIAGNOSIS — Z8673 Personal history of transient ischemic attack (TIA), and cerebral infarction without residual deficits: Secondary | ICD-10-CM

## 2023-07-16 DIAGNOSIS — I2510 Atherosclerotic heart disease of native coronary artery without angina pectoris: Secondary | ICD-10-CM | POA: Diagnosis not present

## 2023-07-16 DIAGNOSIS — E66812 Obesity, class 2: Secondary | ICD-10-CM

## 2023-07-16 DIAGNOSIS — I5022 Chronic systolic (congestive) heart failure: Secondary | ICD-10-CM | POA: Diagnosis not present

## 2023-07-16 DIAGNOSIS — I4891 Unspecified atrial fibrillation: Secondary | ICD-10-CM | POA: Diagnosis not present

## 2023-07-16 LAB — CBC
HCT: 42.3 % (ref 39.0–52.0)
Hemoglobin: 14.4 g/dL (ref 13.0–17.0)
MCH: 31.9 pg (ref 26.0–34.0)
MCHC: 34 g/dL (ref 30.0–36.0)
MCV: 93.8 fL (ref 80.0–100.0)
Platelets: 363 10*3/uL (ref 150–400)
RBC: 4.51 MIL/uL (ref 4.22–5.81)
RDW: 13.1 % (ref 11.5–15.5)
WBC: 10.4 10*3/uL (ref 4.0–10.5)
nRBC: 0 % (ref 0.0–0.2)

## 2023-07-16 LAB — LIPID PANEL
Cholesterol: 183 mg/dL (ref 0–200)
HDL: 27 mg/dL — ABNORMAL LOW (ref >40)
LDL Cholesterol: 122 mg/dL — ABNORMAL HIGH (ref 0–99)
Total CHOL/HDL Ratio: 6.8 {ratio}
Triglycerides: 172 mg/dL — ABNORMAL HIGH (ref <150)
VLDL: 34 mg/dL (ref 0–40)

## 2023-07-16 LAB — HIV ANTIBODY (ROUTINE TESTING W REFLEX): HIV Screen 4th Generation wRfx: NONREACTIVE

## 2023-07-16 LAB — BASIC METABOLIC PANEL
Anion gap: 10 (ref 5–15)
BUN: 25 mg/dL — ABNORMAL HIGH (ref 6–20)
CO2: 23 mmol/L (ref 22–32)
Calcium: 8.3 mg/dL — ABNORMAL LOW (ref 8.9–10.3)
Chloride: 106 mmol/L (ref 98–111)
Creatinine, Ser: 0.97 mg/dL (ref 0.61–1.24)
GFR, Estimated: >60 mL/min (ref >60)
Glucose, Bld: 146 mg/dL — ABNORMAL HIGH (ref 70–99)
Potassium: 4.2 mmol/L (ref 3.5–5.1)
Sodium: 139 mmol/L (ref 135–145)

## 2023-07-16 LAB — HEMOGLOBIN A1C
Hgb A1c MFr Bld: 6.3 % — ABNORMAL HIGH (ref 4.8–5.6)
Mean Plasma Glucose: 134.11 mg/dL

## 2023-07-16 LAB — HEPARIN LEVEL (UNFRACTIONATED): Heparin Unfractionated: <0.1 [IU]/mL — ABNORMAL LOW (ref 0.30–0.70)

## 2023-07-16 LAB — TROPONIN I (HIGH SENSITIVITY): Troponin I (High Sensitivity): 22 ng/L — ABNORMAL HIGH (ref <18)

## 2023-07-16 MED ORDER — METOPROLOL SUCCINATE ER 50 MG PO TB24: 50.0000 mg | ORAL_TABLET | Freq: Every day | ORAL | 0 refills | Status: DC

## 2023-07-16 MED ORDER — ACETAMINOPHEN 325 MG PO TABS
650.0000 mg | ORAL_TABLET | Freq: Four times a day (QID) | ORAL | Status: DC | PRN
  Administered 2023-07-16: 650 mg via ORAL
  Filled 2023-07-16: qty 2

## 2023-07-16 MED ORDER — HEPARIN BOLUS VIA INFUSION
2900.0000 [IU] | Freq: Once | INTRAVENOUS | Status: AC
  Administered 2023-07-16: 2900 [IU] via INTRAVENOUS
  Filled 2023-07-16: qty 2900

## 2023-07-16 MED ORDER — FUROSEMIDE 40 MG PO TABS: 40.0000 mg | ORAL_TABLET | Freq: Every day | ORAL | Status: DC

## 2023-07-16 MED ORDER — METOPROLOL SUCCINATE ER 25 MG PO TB24: 25.0000 mg | ORAL_TABLET | Freq: Every day | ORAL | Status: DC

## 2023-07-16 MED ORDER — APIXABAN 5 MG PO TABS
5.0000 mg | ORAL_TABLET | Freq: Two times a day (BID) | ORAL | Status: DC
  Administered 2023-07-16: 5 mg via ORAL
  Filled 2023-07-16: qty 1

## 2023-07-16 MED ORDER — METOPROLOL SUCCINATE ER 50 MG PO TB24
50.0000 mg | ORAL_TABLET | Freq: Every day | ORAL | Status: DC
  Administered 2023-07-16: 50 mg via ORAL
  Filled 2023-07-16: qty 1

## 2023-07-16 MED ORDER — APIXABAN 5 MG PO TABS: 5.0000 mg | ORAL_TABLET | Freq: Two times a day (BID) | ORAL | 0 refills | Status: DC

## 2023-07-16 NOTE — Assessment & Plan Note (Signed)
BMI 36.92

## 2023-07-16 NOTE — Assessment & Plan Note (Signed)
 [phone] remains on lipitor  80 mg daily.

## 2023-07-16 NOTE — Assessment & Plan Note (Addendum)
 07-16-2023 now back into NSR. Has PAF. Will need Eliquis  5 mg bid for CVA prophylaxis. Cards has increased Toprol -xl to 50 mg qday. K >4 and Mg >2. Depending on echo schedule in the hospital, pt may need to f/u with outpatient cards([facility]) for echo. Potential for DC today.  *update. Due to long list of inpatient echos, cardiology has decided to forgo inpatient echocardiogram. They will obtain outpatient echo on 07-24-2023. Pt will be discharged on eliquis  5 mg bid and Toprol -xl 50 mg qday.

## 2023-07-16 NOTE — Discharge Instructions (Addendum)
 1. Keep scheduled appointment with cardiology on 07-24-2023 for your outpatient echocardiogram. 2. Follow up with your primary care provider in 1 week after discharge. 3. Cardiologist will make you an appointment after your 07-24-2023 echocardiogram.  Please follow up with one of the primary care providers below:   Some PCP options in [facility] area- not a comprehensive list  [facility]- [phone] [facility]- [phone] [facility]- [phone] [facility]- [phone] [facility]- [phone] [facility]- [phone]  or [facility] Physician Referral Line [phone]

## 2023-07-16 NOTE — Progress Notes (Signed)
 Pt removed himself from the CPAP stated he could not wear it.CPAP removed from the room. Pt was encouraged earlier when complaining that he could have someone bring his in from home.

## 2023-07-16 NOTE — Assessment & Plan Note (Signed)
 07-16-2023 remains on ASA 81 mg qday, lipitor  80 mg daily.  Adding eliquis  5 mg bid for CVA prophylaxis from PAF.

## 2023-07-16 NOTE — Progress Notes (Signed)
 PHARMACY - ANTICOAGULATION CONSULT NOTE  Pharmacy Consult for heparin  Indication: atrial fibrillation  Allergies  Allergen Reactions   Entresto  [Sacubitril -Valsartan ] Cough   Jardiance  [Empagliflozin ] Diarrhea    Patient Measurements: Height: 5\' 9"  (175.3 cm) Weight: 113.4 kg (250 lb) IBW/kg (Calculated) : 70.7 Heparin  Dosing Weight: 95.9 kg  Vital Signs: Temp: 97.9 F (36.6 C) (01/15 0323) Temp Source: Oral (01/15 0323) BP: 110/77 (01/15 0230) Pulse Rate: 83 (01/15 0230)  Labs: Recent Labs    07/15/23 1748 07/15/23 1953 07/15/23 1954 07/16/23 0313  HGB 15.5  --   --  14.4  HCT 45.6  --   --  42.3  PLT 416*  --   --  363  APTT  --  26  --   --   LABPROT  --  13.1  --   --   INR  --  1.0  --   --   HEPARINUNFRC  --   --   --  <0.10*  CREATININE 0.94  --   --  0.97  TROPONINIHS 28*  --  28* 22*    Estimated Creatinine Clearance: 103.1 mL/min (by C-G formula based on SCr of 0.97 mg/dL).   Medical History: Past Medical History:  Diagnosis Date   CAD (coronary artery disease)    CHF (congestive heart failure) (HCC)    GERD (gastroesophageal reflux disease)    Hypertension    MI, old    Sleep apnea    Assessment: 58 y/o male presenting with chest pain, found to be in new onset atrial fibrillation with RVR. PMH significant for CAD s/p PCI with 1 stent to LAD (2014), hypertension, gastric reflux, and CHF (EF 35-40% 01/2022). Pharmacy has been consulted to initiate heparin  infusion. Per chart review, patient is not on anticoagulation prior to admission.  Baseline labs: hgb 15.5, plt 416, INR ordered.  Goal of Therapy:  Heparin  level 0.3-0.7 units/ml Monitor platelets by anticoagulation protocol: Yes   Plan:  1/15:  HL @ 0313 = < 0.10 - Will order heparin  2900 units IV X 1 bolus and increase drip rate to 1750 units/hr - Will recheck HL 6 hrs after rate change  Thank you for involving pharmacy in this patient's care.   Mayfield Schoene D Clinical  Pharmacist 07/16/2023 4:28 AM

## 2023-07-16 NOTE — ED Notes (Signed)
 Pharmacy called at [phone] due to pts L FA IV catheter extravasation - at time of extravasation heparin  and diltiazem  were running. Pharmacist recommended warm compresses and elevation - no antidote indicated for these medications.

## 2023-07-16 NOTE — Consult Note (Signed)
 [facility] CLINIC CARDIOLOGY CONSULT NOTE       Patient ID: Brian Porter MRN: [id number] DOB/AGE: 59/23/1967 59 y.o.  Admit date: 07/15/2023 Referring Physician Dr. Xilin Niu Primary Physician [facility]  Primary Cardiologist Barton Like, [place] Reason for Consultation AF RVR  HPI: Brian Porter is a 58 y.o. male  with a past medical history of coronary artery disease s/p PCI to LAD in 2014, chronic HFrEF, dilated cardiomyopathy, hypertension, hyperlipidemia who presented to the ED on 07/15/2023 for chest discomfort. Found to be in atrial fibrillation RVR on EKG. Cardiology was consulted for further evaluation.   Patient reports that for the last few days he has been experiencing chest discomfort.  States that this is different from prior episodes he has had in the past.  States he also had associated shortness of breath.  Given his persistent symptoms he decided to come to the ED for further evaluation.  Workup in the ED notable for creatinine 0.94, potassium 3.7, hemoglobin 15.5, WBC 11.1.  BNP mildly elevated at 245.  Troponins trended 28 > 28 > 22.  EKG revealed atrial fibrillation with RVR with a rate of 144 bpm.  He has no prior history of atrial fibrillation.  He was started on IV diltiazem  and IV Lasix  in the ED.  At the time of my evaluation this morning patient is sitting upright on the side of his ED stretcher.  States that overall he feels better now than he did yesterday.  We discussed his symptoms in further detail.  He states that his chest pain is now resolved.  Denies any overt palpitation symptoms.  States that his main issue was the chest discomfort he was experiencing for the last few days.  States that shortness of breath is improved as well.  Denies any significant worsening of lower extremity edema.  He converted back into normal rhythm shortly prior to my visit this morning.  BP has been borderline low but stable and he is without any orthostatic symptoms.  Review of  systems complete and found to be negative unless listed above    Past Medical History:  Diagnosis Date   Acute on chronic systolic heart failure (HCC) 08/27/2016   CAD (coronary artery disease)    CHF (congestive heart failure) (HCC)    CVA (cerebral vascular accident) (HCC) 10/21/2020   GERD (gastroesophageal reflux disease)    Hypertension    MI, old    NSTEMI (non-ST elevated myocardial infarction) (HCC) 02/14/2022   Sleep apnea    Tobacco abuse 11/02/2015    Past Surgical History:  Procedure Laterality Date   CARDIAC CATHETERIZATION Right 10/16/2015   Procedure: Left Heart Cath and Coronary Angiography;  Surgeon: Cherrie Cornwall, MD;  Location: [facility] INVASIVE CV LAB;  Service: Cardiovascular;  Laterality: Right;   CORONARY ANGIOPLASTY WITH STENT PLACEMENT  approx 2 years ago   HERNIA REPAIR Left 11/29/2016   [facility]   LEFT HEART CATH AND CORONARY ANGIOGRAPHY N/A 02/15/2022   Procedure: LEFT HEART CATH AND CORONARY ANGIOGRAPHY;  Surgeon: Percival Brace, MD;  Location: [facility] INVASIVE CV LAB;  Service: Cardiovascular;  Laterality: N/A;  12:00 PM   PARTIAL NEPHRECTOMY Left    SPLENECTOMY      (Not in a hospital admission)  Social History   Socioeconomic History   Marital status: Single    Spouse name: Not on file   Number of children: Not on file   Years of education: Not on file   Highest education level: Not  on file  Occupational History   Occupation: unemployed  Tobacco Use   Smoking status: Former    Current packs/day: 0.00    Types: Cigarettes    Quit date: 10/24/2020    Years since quitting: 2.7   Smokeless tobacco: Never  Vaping Use   Vaping status: Never Used  Substance and Sexual Activity   Alcohol use: No    Alcohol/week: 0.0 standard drinks of alcohol   Drug use: Not Currently    Comment: percocet   Sexual activity: Not Currently  Other Topics Concern   Not on file  Social History Narrative   Not on file   Social Drivers of Health   Financial  Resource Strain: Low Risk  (09/12/2022)   Overall Financial Resource Strain (CARDIA)    Difficulty of Paying Living Expenses: Not very hard  Food Insecurity: No Food Insecurity (09/12/2022)   Hunger Vital Sign    Worried About Running Out of Food in the Last Year: Never true    Ran Out of Food in the Last Year: Never true  Transportation Needs: No Transportation Needs (09/12/2022)   PRAPARE - Administrator, Civil Service (Medical): No    Lack of Transportation (Non-Medical): No  Physical Activity: Sufficiently Active (09/12/2022)   Exercise Vital Sign    Days of Exercise per Week: 7 days    Minutes of Exercise per Session: 30 min  Stress: No Stress Concern Present (09/12/2022)   [employer] of Occupational Health - Occupational Stress Questionnaire    Feeling of Stress : Not at all  Social Connections: Moderately Integrated (09/12/2022)   Social Connection and Isolation Panel [NHANES]    Frequency of Communication with Friends and Family: More than three times a week    Frequency of Social Gatherings with Friends and Family: Once a week    Attends Religious Services: More than 4 times per year    Active Member of [employer] or Organizations: No    Attends Banker Meetings: Never    Marital Status: Living with partner  Intimate Partner Violence: Not At Risk (09/12/2022)   Humiliation, Afraid, Rape, and Kick questionnaire    Fear of Current or Ex-Partner: No    Emotionally Abused: No    Physically Abused: No    Sexually Abused: No    Family History  Problem Relation Age of Onset   Hypertension Mother    Other Mother        covid pneumonia   Congestive Heart Failure Mother    Stroke Father    Heart attack Father 16   Diabetes Father    Hypertension Father    Diabetes Mellitus II Brother    Alzheimer's disease Maternal Grandmother    Heart attack Maternal Grandfather    Cancer Paternal Grandmother    Stroke Paternal Grandfather      Vitals:   07/16/23  0430 07/16/23 0900 07/16/23 0930 07/16/23 0939  BP: 113/79 (!) 130/93 128/81   Pulse: 81 71 66   Resp: (!) 22 16 18    Temp:    98 F (36.7 C)  TempSrc:    Oral  SpO2: 94% 96% 95%   Weight:      Height:        PHYSICAL EXAM General: Well appearing male, well nourished, in no acute distress. HEENT: Normocephalic and atraumatic. Neck: No JVD.  Lungs: Normal respiratory effort on room air. Clear bilaterally to auscultation. No wheezes, crackles, rhonchi.  Heart: HRRR. Normal S1 and S2  without gallops or murmurs.  Abdomen: Non-distended appearing.  Msk: Normal strength and tone for age. Extremities: Warm and well perfused. No clubbing, cyanosis. No edema.  Neuro: Alert and oriented X 3. Psych: Answers questions appropriately.   Labs: Basic Metabolic Panel: Recent Labs    07/15/23 1748 07/15/23 1954 07/16/23 0313  NA 138  --  139  K 3.7  --  4.2  CL 101  --  106  CO2 24  --  23  GLUCOSE 156*  --  146*  BUN 24*  --  25*  CREATININE 0.94  --  0.97  CALCIUM  8.8*  --  8.3*  MG  --  2.1  --    Liver Function Tests: No results for input(s): "AST", "ALT", "ALKPHOS", "BILITOT", "PROT", "ALBUMIN" in the last 72 hours. No results for input(s): "LIPASE", "AMYLASE" in the last 72 hours. CBC: Recent Labs    07/15/23 1748 07/16/23 0313  WBC 11.1* 10.4  HGB 15.5 14.4  HCT 45.6 42.3  MCV 93.8 93.8  PLT 416* 363   Cardiac Enzymes: Recent Labs    07/15/23 1748 07/15/23 1954 07/16/23 0313  TROPONINIHS 28* 28* 22*   BNP: Recent Labs    07/15/23 1747  BNP 245.3*   D-Dimer: No results for input(s): "DDIMER" in the last 72 hours. Hemoglobin A1C: No results for input(s): "HGBA1C" in the last 72 hours. Fasting Lipid Panel: Recent Labs    07/16/23 0315  CHOL 183  HDL 27*  LDLCALC 122*  TRIG 172*  CHOLHDL 6.8   Thyroid  Function Tests: Recent Labs    07/15/23 1954  TSH 1.059   Anemia Panel: No results for input(s): "VITAMINB12", "FOLATE", "FERRITIN", "TIBC",  "IRON", "RETICCTPCT" in the last 72 hours.   Radiology: [facility] Chest Port 1 View Result Date: 07/15/2023 CLINICAL DATA:  Chest pain. Chest discomfort for a couple days. Shortness of breath. EXAM: PORTABLE CHEST 1 VIEW COMPARISON:  Radiographs 05/13/2023 and 09/06/2022.  CT 06/01/2021. FINDINGS: 1800 hours. The heart is enlarged but stable. Chronic vascular congestion and central airway thickening. There are lower lung volumes with increased patchy bibasilar opacities. No consolidation or pneumothorax. There may be a small amount of pleural fluid on the left. No acute osseous findings are evident. IMPRESSION: Lower lung volumes with increased patchy bibasilar opacities, possibly atelectasis or pneumonia. Possible small left pleural effusion. Electronically Signed   By: Elmon Hagedorn M.D.   On: 07/15/2023 18:33    ECHO ordered  TELEMETRY reviewed by me 07/16/2023: sinus rhythm rate 60s  EKG reviewed by me: atrial fibrillation RVR rate 144 bpm  Data reviewed by me 07/16/2023: last 24h vitals tele labs imaging I/O ED provider note, admission H&P  Principal Problem:   Atrial fibrillation with RVR (HCC) Active Problems:   HTN (hypertension)   History of CVA (cerebrovascular accident)   Dyslipidemia   Chronic systolic CHF (congestive heart failure) (HCC) - Not on Entresto  due to allergy/side effect of cough. Not on Jardiance  due to allergy/side effect of diarrhea   CAD (coronary artery disease)   Obesity, Class II, BMI 35-39.9   Leukocytosis    ASSESSMENT AND PLAN:  Brian Porter is a 58 y.o. male  with a past medical history of coronary artery disease s/p PCI to LAD in 2014, chronic HFrEF, dilated cardiomyopathy, hypertension, hyperlipidemia who presented to the ED on 07/15/2023 for chest discomfort. Found to be in atrial fibrillation RVR on EKG. Cardiology was consulted for further evaluation.   # Atrial fibrillation RVR #  New onset atrial fibrillation Patient presented with chest discomfort,  found to be in AF RVR on EKG. Started on IV diltiazem  and converted to NSR this AM. Troponins 28 > 28 >22. CHADS-VASc is 3.  -Will set patient up for echo in clinic on Friday 1/17.  -Increase metoprolol  succinate to 50 mg daily.  -Start eliquis  5 mg twice daily for stroke risk reduction.  -Minimally elevated and flat trending troponin most consistent with demand/supply mismatch and not ACS. Suspect chest tightness 2/2 increase demand with AF RVR, now resolved.  # Coronary artery disease s/p DES to LAD 2014 # Chronic HFrEF Patient with hx of CAD and underwent stenting in 2014, has had reduced EF for many years. -Continue atorvastatin  80 mg daily and aspirin  81 mg daily.  -Continue metoprolol  succinate as above, spironolactone  25 mg daily, imdur  30 mg daily.  -Continue home lasix  40 mg daily.   Ok for discharge today from a cardiac perspective. Will arrange for follow up in clinic with Barton Like, PA in 1-2 weeks.    This patient's plan of care was discussed and created with Dr. Bob Burn and he is in agreement.  Signed: Hamp Levine, PA-C  07/16/2023, 10:20 AM [facility] Cardiology

## 2023-07-16 NOTE — Assessment & Plan Note (Signed)
 07-16-2023 he will need to continue with ASA 81 mg daily. Lipitor  80 mg daily.

## 2023-07-16 NOTE — ED Notes (Signed)
 Pt had reported that he wears CPAP at night. Respiratory set up CPAP machine and educated/acquainted pt with equipment. RN in room shortly after and pt had taken mask off and states he is unable to tolerate CPAP. Pt states previously wearing small nose piece when sleeping. Pt put on 2L [place] and no longer desaturating when sleeping at this time.

## 2023-07-16 NOTE — Hospital Course (Signed)
 HPI:  Brian Porter is a 58 y.o. male with medical history significant of HTN, HLD, pre-DM, PVD, CAD with stent, sCHF with EF30-40%, stroke, obesity, OSA, who presents with chest pain   Patient states that he has intermittent chest pain and chest discomfort for about 3 days.  The chest pain is located in substernal area, pressure-like, mild to moderate, nonradiating, not aggravated or alleviated by any known factors.  Patient has dizziness intermittently. Pt has mild shortness of breath, no cough, fever or chills.  Denies palpitation.  Patient does not have nausea, vomiting, diarrhea or abdominal pain, but reports that he needs to have 2 or 3 times of bowel movement each day, but not diarrhea.  Patient denies any rectal bleeding or dark stool.  No recent fall or head injury.   Patient was found to have new onset A-fib with heart rate up to 140s.  Cardizem  drip is started in ED.  Significant Events: Admitted 07/15/2023 for afib with RVR   Significant Labs: troponin 28, WBC 11.1 , HgB 15.5, plt 416 Na 138, K 3.7, Mg 2.1, BUN 24, Scr 0.94 BNP 245  Significant Imaging Studies:   Antibiotic Therapy: Anti-infectives (From admission, onward)    None       Procedures:   Consultants: cardiology

## 2023-07-16 NOTE — Assessment & Plan Note (Addendum)
 07-16-2023 stable. Continue with GDMT with Toprol -xl. Now at 50 mg dailiy, aldactone  25 mg daily, imdur  30 mg daily. He does not seem that volume overloaded. I think IV lasix  can stop and he can go back to home dose of lasix  40 mg daily. Not on Entresto  due to allergy/side effect of cough. Not on Jardiance  due to allergy/side effect of diarrhea

## 2023-07-16 NOTE — Discharge Summary (Signed)
 [facility] Phys[facility] Discharge Summary   Patient name: Brian Porter  Admit date:     07/15/2023  Discharge date: 07/16/2023  Attending Physician: NIU, XILIN [[id number]]  Discharge Physician: Unk Garb   PCP: [facility]  Admitted From: Home  Disposition:  Home  Recommendations for Outpatient Follow-up:  Follow up with PCP in 1-2 weeks Follow up for outpatient cardiac echocardiogram on 07-24-2023 as scheduled Please follow up on the following pending results:  Home Health:No Equipment/Devices: None  Discharge Condition:Stable CODE STATUS:FULL Diet recommendation: Heart Healthy Fluid Restriction: None  Hospital Summary: HPI:  Brian Porter is a 58 y.o. male with medical history significant of HTN, HLD, pre-DM, PVD, CAD with stent, sCHF with EF30-40%, stroke, obesity, OSA, who presents with chest pain   Patient states that he has intermittent chest pain and chest discomfort for about 3 days.  The chest pain is located in substernal area, pressure-like, mild to moderate, nonradiating, not aggravated or alleviated by any known factors.  Patient has dizziness intermittently. Pt has mild shortness of breath, no cough, fever or chills.  Denies palpitation.  Patient does not have nausea, vomiting, diarrhea or abdominal pain, but reports that he needs to have 2 or 3 times of bowel movement each day, but not diarrhea.  Patient denies any rectal bleeding or dark stool.  No recent fall or head injury.   Patient was found to have new onset A-fib with heart rate up to 140s.  Cardizem  drip is started in ED.  Significant Events: Admitted 07/15/2023 for afib with RVR   Significant Labs: troponin 28, WBC 11.1 , HgB 15.5, plt 416 Na 138, K 3.7, Mg 2.1, BUN 24, Scr 0.94 BNP 245  Significant Imaging Studies:   Antibiotic Therapy: Anti-infectives (From admission, onward)    None       Procedures:   Consultants: cardiology   Hospital Course by Problem: * Atrial  fibrillation with RVR (HCC) 07-16-2023 now back into NSR. Has PAF. Will need Eliquis  5 mg bid for CVA prophylaxis. Cards has increased Toprol -xl to 50 mg qday. K >4 and Mg >2. Depending on echo schedule in the hospital, pt may need to f/u with outpatient cards([facility]) for echo. Potential for DC today.  *update. Due to long list of inpatient echos, cardiology has decided to forgo inpatient echocardiogram. They will obtain outpatient echo on 07-24-2023. Pt will be discharged on eliquis  5 mg bid and Toprol -xl 50 mg qday.  CAD (coronary artery disease) 07-16-2023 he will need to continue with ASA 81 mg daily. Lipitor  80 mg daily.  Chronic systolic CHF (congestive heart failure) (HCC) - Not on Entresto  due to allergy/side effect of cough. Not on Jardiance  due to allergy/side effect of diarrhea 07-16-2023 stable. Continue with GDMT with Toprol -xl. Now at 50 mg dailiy, aldactone  25 mg daily, imdur  30 mg daily. He does not seem that volume overloaded. I think IV lasix  can stop and he can go back to home dose of lasix  40 mg daily. Not on Entresto  due to allergy/side effect of cough. Not on Jardiance  due to allergy/side effect of diarrhea  Dyslipidemia [phone] remains on lipitor  80 mg daily.  HTN (hypertension) 07-16-2023 on increased dose of toprol -xl. Now at 50 mg daily, instead of 25 mg daily. Continue aldactone  25 mg daily, imdur  30 mg daily.  History of CVA (cerebrovascular accident) 07-16-2023 remains on ASA 81 mg qday, lipitor  80 mg daily.  Adding eliquis  5 mg bid for CVA prophylaxis from PAF.  Leukocytosis 07-16-2023  resolved. WBC 10.4. likely due to stress from tachycardia  Obesity, Class II, BMI 35-39.9 BMI 36.92    Discharge Diagnoses:  Principal Problem:   Atrial fibrillation with RVR (HCC) Active Problems:   CAD (coronary artery disease)   Chronic systolic CHF (congestive heart failure) (HCC) - Not on Entresto  due to allergy/side effect of cough. Not on Jardiance  due to  allergy/side effect of diarrhea   HTN (hypertension)   Dyslipidemia   History of CVA (cerebrovascular accident)   Leukocytosis   Obesity, Class II, BMI 35-39.9   Discharge Instructions  Discharge Instructions     (HEART FAILURE PATIENTS) Call MD:  Anytime you have any of the following symptoms: 1) 3 pound weight gain in 24 hours or 5 pounds in 1 week 2) shortness of breath, with or without a dry hacking cough 3) swelling in the hands, feet or stomach 4) if you have to sleep on extra pillows at night in order to breathe.   Complete by: As directed    Call MD for:  difficulty breathing, headache or visual disturbances   Complete by: As directed    Call MD for:  extreme fatigue   Complete by: As directed    Call MD for:  hives   Complete by: As directed    Call MD for:  persistant dizziness or light-headedness   Complete by: As directed    Call MD for:  persistant nausea and vomiting   Complete by: As directed    Call MD for:  redness, tenderness, or signs of infection (pain, swelling, redness, odor or green/yellow discharge around incision site)   Complete by: As directed    Call MD for:  severe uncontrolled pain   Complete by: As directed    Call MD for:  temperature >100.4   Complete by: As directed    Diet - low sodium heart healthy   Complete by: As directed    Discharge instructions   Complete by: As directed    1. Keep scheduled appointment with cardiology on 07-24-2023 for your outpatient echocardiogram. 2. Follow up with your primary care provider in 1 week after discharge. 3. Cardiologist will make you an appointment after your 07-24-2023 echocardiogram.   Increase activity slowly   Complete by: As directed       Allergies as of 07/16/2023       Reactions   Entresto  [sacubitril -valsartan ] Cough   Jardiance  [empagliflozin ] Diarrhea        Medication List     TAKE these medications    acetaminophen  650 MG CR tablet Commonly known as: TYLENOL  Take 650 mg by  mouth every 8 (eight) hours as needed for pain.   albuterol  108 (90 Base) MCG/ACT inhaler Commonly known as: VENTOLIN  HFA Inhale 2-4 puffs by mouth every 4 hours as needed for wheezing, cough, and/or shortness of breath   apixaban  5 MG Tabs tablet Commonly known as: ELIQUIS  Take 1 tablet (5 mg total) by mouth 2 (two) times daily.   aspirin  EC 81 MG tablet Take 1 tablet (81 mg total) by mouth daily.   atorvastatin  80 MG tablet Commonly known as: LIPITOR  Take 1 tablet (80 mg total) by mouth daily.   azithromycin  250 MG tablet Commonly known as: Zithromax  Z-Pak Take 2 tablets by mouth on day 1, and one tablet by mouth on days 2-5   chlorpheniramine-HYDROcodone  10-8 MG/5ML Commonly known as: TUSSIONEX Take 5 mLs by mouth every 12 (twelve) hours as needed for cough.   cyclobenzaprine  10  MG tablet Commonly known as: FLEXERIL  Take 10 mg by mouth at bedtime as needed for muscle spasms.   furosemide  40 MG tablet Commonly known as: LASIX  Take 1 tablet (40 mg total) by mouth daily.   gabapentin  100 MG capsule Commonly known as: NEURONTIN  Take 1 capsule (100 mg total) by mouth 3 (three) times daily.   ibuprofen  200 MG tablet Commonly known as: ADVIL  Take 200 mg by mouth every 6 (six) hours as needed.   isosorbide  mononitrate 30 MG 24 hr tablet Commonly known as: IMDUR  Take 30 mg by mouth daily.   metoprolol  succinate 50 MG 24 hr tablet Commonly known as: TOPROL -XL Take 1 tablet (50 mg total) by mouth daily. What changed:  medication strength how much to take   potassium chloride  10 MEQ tablet Commonly known as: KLOR-CON  M Take 1 tablet (10 mEq total) by mouth daily.   ranolazine  500 MG 12 hr tablet Commonly known as: RANEXA  Take 1 tablet (500 mg total) by mouth 2 (two) times daily.   spironolactone  25 MG tablet Commonly known as: ALDACTONE  Take 1 tablet (25 mg total) by mouth daily.        Follow-up Information     [place], [place]. Go in 1 week(s).   Why:  Echo scheduled for Monday 07/21/23 at 12PM. Follow up with Antony Baumgartner in 1 week after echo. Contact information: [address] [facility] [place] [address] [phone]                Allergies  Allergen Reactions   Entresto  [Sacubitril -Valsartan ] Cough   Jardiance  [Empagliflozin ] Diarrhea    Discharge Exam: Vitals:   07/16/23 1015 07/16/23 1051  BP:  116/84  Pulse: 65 77  Resp: 12   Temp:    SpO2: 95%     Physical Exam Vitals and nursing note reviewed.  Constitutional:      General: He is not in acute distress.    Appearance: He is obese. He is not toxic-appearing or diaphoretic.  HENT:     Head: Normocephalic and atraumatic.     Nose: Nose normal.  Eyes:     General: No scleral icterus. Cardiovascular:     Rate and Rhythm: Normal rate and regular rhythm.  Pulmonary:     Effort: Pulmonary effort is normal.     Breath sounds: Normal breath sounds.  Abdominal:     General: Bowel sounds are normal. There is no distension.     Palpations: Abdomen is soft.     Tenderness: There is no abdominal tenderness.  Musculoskeletal:     Right ankle: Swelling present.     Left ankle: Swelling present.     Comments: Trace ankle edema bilaterally  Skin:    General: Skin is warm and dry.     Capillary Refill: Capillary refill takes less than 2 seconds.  Neurological:     Mental Status: He is alert and oriented to person, place, and time.     The results of significant diagnostics from this hospitalization (including imaging, microbiology, ancillary and laboratory) are listed below for reference.     Labs: BNP (last 3 results) Recent Labs    07/15/23 1747  BNP 245.3*   Basic Metabolic Panel: Recent Labs  Lab 07/15/23 1748 07/15/23 1954 07/16/23 0313  NA 138  --  139  K 3.7  --  4.2  CL 101  --  106  CO2 24  --  23  GLUCOSE 156*  --  146*  BUN  24*  --  25*  CREATININE 0.94  --  0.97  CALCIUM  8.8*  --  8.3*  MG  --  2.1  --    CBC: Recent  Labs  Lab 07/15/23 1748 07/16/23 0313  WBC 11.1* 10.4  HGB 15.5 14.4  HCT 45.6 42.3  MCV 93.8 93.8  PLT 416* 363   BNP: Recent Labs  Lab 07/15/23 1747  BNP 245.3*   Hgb A1c Recent Labs    07/16/23 0313  HGBA1C 6.3*   Lipid Profile Recent Labs    07/16/23 0315  CHOL 183  HDL 27*  LDLCALC 122*  TRIG 172*  CHOLHDL 6.8   Thyroid  function studies Recent Labs    07/15/23 1954  TSH 1.059  FREET4 0.70   Urinalysis    Component Value Date/Time   COLORURINE YELLOW 03/04/2023 1306   APPEARANCEUR CLEAR 03/04/2023 1306   LABSPEC 1.020 03/04/2023 1306   PHURINE 7.0 03/04/2023 1306   GLUCOSEU NEGATIVE 03/04/2023 1306   HGBUR NEGATIVE 03/04/2023 1306   BILIRUBINUR NEGATIVE 03/04/2023 1306   KETONESUR NEGATIVE 03/04/2023 1306   PROTEINUR NEGATIVE 03/04/2023 1306   UROBILINOGEN 1.0 12/10/2014 2035   NITRITE NEGATIVE 03/04/2023 1306   LEUKOCYTESUR NEGATIVE 03/04/2023 1306   Sepsis Labs Recent Labs  Lab 07/15/23 1748 07/16/23 0313  WBC 11.1* 10.4    Procedures/Studies: DG Chest Port 1 View Result Date: 07/15/2023 CLINICAL DATA:  Chest pain. Chest discomfort for a couple days. Shortness of breath. EXAM: PORTABLE CHEST 1 VIEW COMPARISON:  Radiographs 05/13/2023 and 09/06/2022.  CT 06/01/2021. FINDINGS: 1800 hours. The heart is enlarged but stable. Chronic vascular congestion and central airway thickening. There are lower lung volumes with increased patchy bibasilar opacities. No consolidation or pneumothorax. There may be a small amount of pleural fluid on the left. No acute osseous findings are evident. IMPRESSION: Lower lung volumes with increased patchy bibasilar opacities, possibly atelectasis or pneumonia. Possible small left pleural effusion. Electronically Signed   By: Elmon Hagedorn M.D.   On: 07/15/2023 18:33    Time coordinating discharge: 45 mins  SIGNED:  Unk Garb, DO [facility] 07/16/23, 10:57 AM

## 2023-07-16 NOTE — Subjective & Objective (Signed)
 Pt seen and examined. Back into NSR with HR in the 60s. Have placed orders to stop IV heparin  and IV cardizem . Start po eliquis . Discussed with cards. They want to increase toprol -xl to 50 mg qday(from 25 mg).  Hospital is at capacity and many patients are waiting in the ER. Cards could potentially get his echo done as outpatient.

## 2023-07-16 NOTE — Assessment & Plan Note (Signed)
 07-16-2023 on increased dose of toprol -xl. Now at 50 mg daily, instead of 25 mg daily. Continue aldactone  25 mg daily, imdur  30 mg daily.

## 2023-07-16 NOTE — TOC CM/SW Note (Signed)
 CSW added PCP resources to AVS. TOC consult complete.

## 2023-07-16 NOTE — Progress Notes (Signed)
 PHARMACY - ANTICOAGULATION CONSULT NOTE  Pharmacy Consult for Apixaban  Indication: atrial fibrillation  Allergies  Allergen Reactions   Entresto  [Sacubitril -Valsartan ] Cough   Jardiance  [Empagliflozin ] Diarrhea    Patient Measurements: Height: 5\' 9"  (175.3 cm) Weight: 113.4 kg (250 lb) IBW/kg (Calculated) : 70.7 Heparin  Dosing Weight: NA  Vital Signs: Temp: 98 F (36.7 C) (01/15 0939) Temp Source: Oral (01/15 0939) BP: 128/81 (01/15 0930) Pulse Rate: 66 (01/15 0930)  Labs: Recent Labs    07/15/23 1748 07/15/23 1953 07/15/23 1954 07/16/23 0313  HGB 15.5  --   --  14.4  HCT 45.6  --   --  42.3  PLT 416*  --   --  363  APTT  --  26  --   --   LABPROT  --  13.1  --   --   INR  --  1.0  --   --   HEPARINUNFRC  --   --   --  <0.10*  CREATININE 0.94  --   --  0.97  TROPONINIHS 28*  --  28* 22*   Estimated Creatinine Clearance: 103.1 mL/min (by C-G formula based on SCr of 0.97 mg/dL).  Medical History: Past Medical History:  Diagnosis Date   CAD (coronary artery disease)    CHF (congestive heart failure) (HCC)    GERD (gastroesophageal reflux disease)    Hypertension    MI, old    Sleep apnea    Assessment: Brian Porter is a 58 yo male who presented with chest pain and shortness of breath. They were found to be tachycardic with a bpm around 145 bpm. They were diagnosed with new onset atrial fibrillation. CHA2DS2-VASc 5. They were previously on a heparin  gtt and now transition to apixaban . Pharmacy was consulted to start apixaban .   Plan:  Start apixaban  5 mg BID (wt > 60 kg, age < 80, Scr < 1.5)  Craven Do, PharmD Pharmacy Resident  07/16/2023 10:01 AM

## 2023-07-16 NOTE — Assessment & Plan Note (Signed)
 07-16-2023 resolved. WBC 10.4. likely due to stress from tachycardia

## 2023-07-16 NOTE — Progress Notes (Signed)
 PROGRESS NOTE    Brian Porter [facility]  ZOX:[id number] DOB: [birth year] DOA: 07/15/2023 PCP: [facility]  Subjective: Pt seen and examined. Back into NSR with HR in the 60s. Have placed orders to stop IV heparin  and IV cardizem . Start po eliquis . Discussed with cards. They want to increase toprol -xl to 50 mg qday(from 25 mg).  Hospital is at capacity and many patients are waiting in the ER. Cards could potentially get his echo done as outpatient.   Hospital Course: HPI:  Brian Porter is a 58 y.o. male with medical history significant of HTN, HLD, pre-DM, PVD, CAD with stent, sCHF with EF30-40%, stroke, obesity, OSA, who presents with chest pain   Patient states that he has intermittent chest pain and chest discomfort for about 3 days.  The chest pain is located in substernal area, pressure-like, mild to moderate, nonradiating, not aggravated or alleviated by any known factors.  Patient has dizziness intermittently. Pt has mild shortness of breath, no cough, fever or chills.  Denies palpitation.  Patient does not have nausea, vomiting, diarrhea or abdominal pain, but reports that he needs to have 2 or 3 times of bowel movement each day, but not diarrhea.  Patient denies any rectal bleeding or dark stool.  No recent fall or head injury.   Patient was found to have new onset A-fib with heart rate up to 140s.  Cardizem  drip is started in ED.  Significant Events: Admitted 07/15/2023 for afib with RVR   Significant Labs: troponin 28, WBC 11.1 , HgB 15.5, plt 416 Na 138, K 3.7, Mg 2.1, BUN 24, Scr 0.94 BNP 245  Significant Imaging Studies:   Antibiotic Therapy: Anti-infectives (From admission, onward)    None       Procedures:   Consultants: cardiology    Assessment and Plan: * Atrial fibrillation with RVR (HCC) 07-16-2023 now back into NSR. Has PAF. Will need Eliquis  5 mg bid for CVA prophylaxis. Cards has increased Toprol -xl to 50 mg qday. K >4 and Mg >2. Depending on echo  schedule in the hospital, pt may need to f/u with outpatient cards([facility]) for echo. Potential for DC today.  CAD (coronary artery disease) 07-16-2023 he will need to continue with ASA 81 mg daily. Lipitor  80 mg daily.  Chronic systolic CHF (congestive heart failure) (HCC) - Not on Entresto  due to allergy/side effect of cough. Not on Jardiance  due to allergy/side effect of diarrhea 07-16-2023 stable. Continue with GDMT with Toprol -xl. Now at 50 mg dailiy, aldactone  25 mg daily, imdur  30 mg daily. He does not seem that volume overloaded. I think IV lasix  can stop and he can go back to home dose of lasix  40 mg daily. Not on Entresto  due to allergy/side effect of cough. Not on Jardiance  due to allergy/side effect of diarrhea  Dyslipidemia [phone] remains on lipitor  80 mg daily.  HTN (hypertension) 07-16-2023 on increased dose of toprol -xl. Now at 50 mg daily, instead of 25 mg daily. Continue aldactone  25 mg daily, imdur  30 mg daily.  History of CVA (cerebrovascular accident) 07-16-2023 remains on ASA 81 mg qday, lipitor  80 mg daily.  Adding eliquis  5 mg bid for CVA prophylaxis from PAF.  Leukocytosis 07-16-2023 resolved. WBC 10.4. likely due to stress from tachycardia  Obesity, Class II, BMI 35-39.9 BMI 36.92       DVT prophylaxis:  apixaban  (ELIQUIS ) tablet 5 mg     Code Status: Full Code Family Communication: discussed with pt. Pt is decisional. No family at bedside Disposition  Plan: return home Reason for continuing need for hospitalization: awaiting echo. Possible DC today if hospital echo cannot be obtained  Objective: Vitals:   07/16/23 0430 07/16/23 0900 07/16/23 0930 07/16/23 0939  BP: 113/79 (!) 130/93 128/81   Pulse: 81 71 66   Resp: (!) 22 16 18    Temp:    98 F (36.7 C)  TempSrc:    Oral  SpO2: 94% 96% 95%   Weight:      Height:        Intake/Output Summary (Last 24 hours) at 07/16/2023 1001 Last data filed at 07/16/2023 0648 Gross per 24 hour   Intake 320.51 ml  Output --  Net 320.51 ml   Filed Weights   07/15/23 1741  Weight: 113.4 kg    Examination:  Physical Exam Vitals and nursing note reviewed.  Constitutional:      General: He is not in acute distress.    Appearance: He is obese. He is not toxic-appearing or diaphoretic.  HENT:     Head: Normocephalic and atraumatic.     Nose: Nose normal.  Eyes:     General: No scleral icterus. Cardiovascular:     Rate and Rhythm: Normal rate and regular rhythm.  Pulmonary:     Effort: Pulmonary effort is normal.     Breath sounds: Normal breath sounds.  Abdominal:     General: Bowel sounds are normal. There is no distension.     Palpations: Abdomen is soft.     Tenderness: There is no abdominal tenderness.  Musculoskeletal:     Right ankle: Swelling present.     Left ankle: Swelling present.     Comments: Trace ankle edema bilaterally  Skin:    General: Skin is warm and dry.     Capillary Refill: Capillary refill takes less than 2 seconds.  Neurological:     Mental Status: He is alert and oriented to person, place, and time.     Data Reviewed: I have personally reviewed following labs and imaging studies  CBC: Recent Labs  Lab 07/15/23 1748 07/16/23 0313  WBC 11.1* 10.4  HGB 15.5 14.4  HCT 45.6 42.3  MCV 93.8 93.8  PLT 416* 363   Basic Metabolic Panel: Recent Labs  Lab 07/15/23 1748 07/15/23 1954 07/16/23 0313  NA 138  --  139  K 3.7  --  4.2  CL 101  --  106  CO2 24  --  23  GLUCOSE 156*  --  146*  BUN 24*  --  25*  CREATININE 0.94  --  0.97  CALCIUM  8.8*  --  8.3*  MG  --  2.1  --    GFR: Estimated Creatinine Clearance: 103.1 mL/min (by C-G formula based on SCr of 0.97 mg/dL). Coagulation Profile: Recent Labs  Lab 07/15/23 1953  INR 1.0   BNP (last 3 results) Recent Labs    07/15/23 1747  BNP 245.3*   Lipid Profile: Recent Labs    07/16/23 0315  CHOL 183  HDL 27*  LDLCALC 122*  TRIG 172*  CHOLHDL 6.8   Thyroid   Function Tests: Recent Labs    07/15/23 1954  TSH 1.059  FREET4 0.70   Sepsis Labs: Recent Labs  Lab 07/15/23 1954  PROCALCITON <0.10    Radiology Studies: [facility] Chest Port 1 View Result Date: 07/15/2023 CLINICAL DATA:  Chest pain. Chest discomfort for a couple days. Shortness of breath. EXAM: PORTABLE CHEST 1 VIEW COMPARISON:  Radiographs 05/13/2023 and 09/06/2022.  CT 06/01/2021.  FINDINGS: 1800 hours. The heart is enlarged but stable. Chronic vascular congestion and central airway thickening. There are lower lung volumes with increased patchy bibasilar opacities. No consolidation or pneumothorax. There may be a small amount of pleural fluid on the left. No acute osseous findings are evident. IMPRESSION: Lower lung volumes with increased patchy bibasilar opacities, possibly atelectasis or pneumonia. Possible small left pleural effusion. Electronically Signed   By: Elmon Hagedorn M.D.   On: 07/15/2023 18:33    Scheduled Meds:  apixaban   5 mg Oral BID   aspirin  EC  81 mg Oral Daily   atorvastatin   80 mg Oral Daily   furosemide   40 mg Oral Daily   gabapentin   100 mg Oral TID   isosorbide  mononitrate  30 mg Oral Daily   metoprolol  succinate  50 mg Oral Daily   spironolactone   25 mg Oral Daily   Continuous Infusions:   LOS: 0 days   Time spent: 40 minutes  Unk Garb, DO  [facility]  07/16/2023, 10:01 AM

## 2023-07-16 NOTE — Telephone Encounter (Signed)
 Patient Product/process development scientist completed.    The patient is insured through [employer] [place].     Ran test claim for Eliquis  5 mg and the current 30 day co-pay is $4.00.   This test claim was processed through [facility]- copay amounts may vary at other pharmacies due to pharmacy/plan contracts, or as the patient moves through the different stages of their insurance plan.     Morgan Arab, CPHT Pharmacy Technician III Certified Patient Advocate [facility] Patient Advocate Team Direct Number: [phone]  Fax: [phone]

## 2023-07-17 ENCOUNTER — Encounter: Payer: MEDICAID | Admitting: Family

## 2023-07-23 NOTE — Progress Notes (Deleted)
PCP: [facility] (last seen 08/24) Primary Cardiologist: Dorothyann Peng, MD/  Leanora Ivanoff, [place] (last seen 09/24)  Chief Complaint:  HPI:  Mr Martinie is a 58 y/o male with a history of obstructive sleep apnea (without CPAP), CAD with stent, PVD, COPD, HTN, stroke, hyperlipidemia, atrial fibrillation (new onset 01/25), GERD, MI, current tobacco use and chronic heart failure.   Was in the ED 09/06/22 due to cough and right sided chest pain thought to be due to COPD exacerbation. Was in the ED 05/13/23 due to viral illness.   Was in the ED 07/15/23 due to chest pain & increased shortness of breath due to new onset a fib. HR 145. Gave IV diltiazem for atrial fibrillation and transitioned to oral medications. Cardiology consulted. Started on eliquis.   Echo 10/15/15: EF of 35% along with moderate MR/TR and an elevated PA pressure of 48 mm Hg.  Echo 05/24/2019: EF of 30-35% along with trivial TR/PR. Echo 10/22/20: EF of 25-30% along with mild MR/AR. Echo 02/15/22: EF of 35-40% along with mild MR.    Echo 07/21/23: EF 25% with Grade I DD, mild AR, moderate MR  LHC done 02/15/22:    RPAV-2 lesion is 90% stenosed.   RPDA lesion is 75% stenosed.   2nd RPL lesion is 80% stenosed.   1st Mrg lesion is 90% stenosed.   1st Diag lesion is 90% stenosed.   2nd Diag lesion is 90% stenosed.   Mid LAD-1 lesion is 10% stenosed.   Mid LAD-2 lesion is 50% stenosed.   RPAV-1 lesion is 90% stenosed.   There is moderate to severe left ventricular systolic dysfunction.   The left ventricular ejection fraction is 25-35% by visual estimate.  1.  NSTEMI 2.  Three-vessel coronary artery disease 90% stenosis D1, 90% stenosis D2, 90% stenosis OM1, 90% stenosis RPAV 1 and 2, 75% stenosis RPDA, without obvious culprit vessel, not ideal for percutaneous revascularization or surgical revascularization 3.  Moderate to severe dilated cardiomyopathy, with LVEF 30%, with multiple regional wall motion abnormalities Had a cardiac  catheterization done 10/16/15 and showed an EF of 15% along with multi-vessel disease. Recommended treatment for cardiomyopathy.     He presents today for a HF follow-up visit with a chief complaint of     ROS: All systems negative except as listed in HPI, PMH and Problem List.  SH:  Social History   Socioeconomic History   Marital status: Single    Spouse name: Not on file   Number of children: Not on file   Years of education: Not on file   Highest education level: Not on file  Occupational History   Occupation: unemployed  Tobacco Use   Smoking status: Former    Current packs/day: 0.00    Types: Cigarettes    Quit date: 10/24/2020    Years since quitting: 2.7   Smokeless tobacco: Never  Vaping Use   Vaping status: Never Used  Substance and Sexual Activity   Alcohol use: No    Alcohol/week: 0.0 standard drinks of alcohol   Drug use: Not Currently    Comment: percocet   Sexual activity: Not Currently  Other Topics Concern   Not on file  Social History Narrative   Not on file   Social Drivers of Health   Financial Resource Strain: Low Risk  (09/12/2022)   Overall Financial Resource Strain (CARDIA)    Difficulty of Paying Living Expenses: Not very hard  Food Insecurity: No Food Insecurity (09/12/2022)   Hunger  Vital Sign    Worried About Programme researcher, broadcasting/film/video in the Last Year: Never true    Ran Out of Food in the Last Year: Never true  Transportation Needs: No Transportation Needs (09/12/2022)   PRAPARE - Administrator, Civil Service (Medical): No    Lack of Transportation (Non-Medical): No  Physical Activity: Sufficiently Active (09/12/2022)   Exercise Vital Sign    Days of Exercise per Week: 7 days    Minutes of Exercise per Session: 30 min  Stress: No Stress Concern Present (09/12/2022)   [employer] of Occupational Health - Occupational Stress Questionnaire    Feeling of Stress : Not at all  Social Connections: Moderately Integrated (09/12/2022)    Social Connection and Isolation Panel [NHANES]    Frequency of Communication with Friends and Family: More than three times a week    Frequency of Social Gatherings with Friends and Family: Once a week    Attends Religious Services: More than 4 times per year    Active Member of [employer] or Organizations: No    Attends Banker Meetings: Never    Marital Status: Living with partner  Intimate Partner Violence: Not At Risk (09/12/2022)   Humiliation, Afraid, Rape, and Kick questionnaire    Fear of Current or Ex-Partner: No    Emotionally Abused: No    Physically Abused: No    Sexually Abused: No    FH:  Family History  Problem Relation Age of Onset   Hypertension Mother    Other Mother        covid pneumonia   Congestive Heart Failure Mother    Stroke Father    Heart attack Father 81   Diabetes Father    Hypertension Father    Diabetes Mellitus II Brother    Alzheimer's disease Maternal Grandmother    Heart attack Maternal Grandfather    Cancer Paternal Grandmother    Stroke Paternal Grandfather     Past Medical History:  Diagnosis Date   Acute on chronic systolic heart failure (HCC) 08/27/2016   CAD (coronary artery disease)    CHF (congestive heart failure) (HCC)    CVA (cerebral vascular accident) (HCC) 10/21/2020   GERD (gastroesophageal reflux disease)    Hypertension    MI, old    NSTEMI (non-ST elevated myocardial infarction) (HCC) 02/14/2022   Sleep apnea    Tobacco abuse 11/02/2015    Current Outpatient Medications  Medication Sig Dispense Refill   acetaminophen (TYLENOL) 650 MG CR tablet Take 650 mg by mouth every 8 (eight) hours as needed for pain.     albuterol (VENTOLIN HFA) 108 (90 Base) MCG/ACT inhaler Inhale 2-4 puffs by mouth every 4 hours as needed for wheezing, cough, and/or shortness of breath 6.7 g 0   apixaban (ELIQUIS) 5 MG TABS tablet Take 1 tablet (5 mg total) by mouth 2 (two) times daily. 180 tablet 0   aspirin 81 MG EC tablet  Take 1 tablet (81 mg total) by mouth daily. 30 tablet 2   atorvastatin (LIPITOR) 80 MG tablet Take 1 tablet (80 mg total) by mouth daily. (Patient not taking: Reported on 07/15/2023) 90 tablet 1   azithromycin (ZITHROMAX Z-PAK) 250 MG tablet Take 2 tablets by mouth on day 1, and one tablet by mouth on days 2-5 (Patient not taking: Reported on 07/15/2023) 6 each 0   chlorpheniramine-HYDROcodone (TUSSIONEX) 10-8 MG/5ML Take 5 mLs by mouth every 12 (twelve) hours as needed for cough. 115 mL 0  cyclobenzaprine (FLEXERIL) 10 MG tablet Take 10 mg by mouth at bedtime as needed for muscle spasms.     furosemide (LASIX) 40 MG tablet Take 1 tablet (40 mg total) by mouth daily. 90 tablet 1   gabapentin (NEURONTIN) 100 MG capsule Take 1 capsule (100 mg total) by mouth 3 (three) times daily. 90 capsule 0   ibuprofen (ADVIL) 200 MG tablet Take 200 mg by mouth every 6 (six) hours as needed.     isosorbide mononitrate (IMDUR) 30 MG 24 hr tablet Take 30 mg by mouth daily.     metoprolol succinate (TOPROL-XL) 50 MG 24 hr tablet Take 1 tablet (50 mg total) by mouth daily. 90 tablet 0   potassium chloride (KLOR-CON M) 10 MEQ tablet Take 1 tablet (10 mEq total) by mouth daily. 90 tablet 3   ranolazine (RANEXA) 500 MG 12 hr tablet Take 1 tablet (500 mg total) by mouth 2 (two) times daily. (Patient not taking: Reported on 07/15/2023) 60 tablet 0   spironolactone (ALDACTONE) 25 MG tablet Take 1 tablet (25 mg total) by mouth daily. 90 tablet 3   No current facility-administered medications for this visit.     PHYSICAL EXAM:  General:  Well appearing. No resp difficulty HEENT: normal Neck: supple. JVP flat. No lymphadenopathy or thryomegaly appreciated. Cor: PMI normal. Regular rate & rhythm. No rubs, gallops or murmurs. Lungs: clear Abdomen: soft, nontender, nondistended. No hepatosplenomegaly. No bruits or masses.  Extremities: no cyanosis, clubbing, rash, trace pitting edema bilateral lower legs Neuro: alert &  orientedx3, cranial nerves grossly intact. Moves all 4 extremities w/o difficulty. Affect pleasant.   ECG: not done   ASSESSMENT & PLAN:  1: Chronic heart failure with reduced ejection fraction- - suspect due to HTN/ CAD - NYHA class II - euvolemic in appearance  - weighing daily & he says that this weight gain has been gradual; Reminded to call for an overnight weight gain of >2 pounds or a weekly weight gain of >5 pounds.  - weight 252 pounds since he was last here 6 months ago - Echo 10/15/15: EF of 35% along with moderate MR/TR and an elevated PA pressure of 48 mm Hg.  - Echo 05/24/2019: EF of 30-35% along with trivial TR/PR. - Echo 10/22/20: EF of 25-30% along with mild MR/AR. - Echo 02/15/22: EF of 35-40% along with mild MR.  - Echo 07/21/23: EF 25% with Grade I DD, mild AR, moderate MR - continue metoprolol succinate 25mg  daily - continue spironolactone 25mg  daily - continue furosemide 40mg  daily/ daily - unable to tolerate entresto or jardiance; tolerated farxiga but wasn't approved for patient assistance and he can't afford to take it - has been adding a little salt and admits that he could be doing better with monitoring it; encouraged to not add any salt to his food - wearing compression socks daily - BNP 07/15/23 was 28.0  2: HTN- - BP  - saw provider at [facility] 08/24 - BMP 07/16/23 showed sodium 139, potassium 4.2, creatinine 0.97 and GFR >60  3: CAD- - saw cardiology Mellissa Kohut) 09/24 - continue atorvastatin 80mg  - LHC done 02/15/22:    RPAV-2 lesion is 90% stenosed.   RPDA lesion is 75% stenosed.   2nd RPL lesion is 80% stenosed.   1st Mrg lesion is 90% stenosed.   1st Diag lesion is 90% stenosed.   2nd Diag lesion is 90% stenosed.   Mid LAD-1 lesion is 10% stenosed.   Mid LAD-2 lesion is  50% stenosed.   RPAV-1 lesion is 90% stenosed.   There is moderate to severe left ventricular systolic dysfunction.   The left ventricular ejection fraction is 25-35%  by visual estimate.  1.  NSTEMI 2.  Three-vessel coronary artery disease 90% stenosis D1, 90% stenosis D2, 90% stenosis OM1, 90% stenosis RPAV 1 and 2, 75% stenosis RPDA, without obvious culprit vessel, not ideal for percutaneous revascularization or surgical revascularization 3.  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

## 2023-07-24 ENCOUNTER — Telehealth: Payer: Self-pay | Admitting: Family

## 2023-07-24 ENCOUNTER — Encounter: Payer: MEDICAID | Admitting: Family

## 2023-07-24 NOTE — Telephone Encounter (Signed)
Patient did not show for his [facility] appointment on 07/24/23.

## 2023-09-28 ENCOUNTER — Emergency Department
Admission: EM | Admit: 2023-09-28 | Discharge: 2023-09-28 | Disposition: A | Discharge status: Home / Self Care | Payer: MEDICAID | Attending: Emergency Medicine | Admitting: Emergency Medicine

## 2023-09-28 ENCOUNTER — Encounter: Payer: Self-pay | Admitting: Emergency Medicine

## 2023-09-28 ENCOUNTER — Other Ambulatory Visit: Payer: Self-pay

## 2023-09-28 DIAGNOSIS — I110 Hypertensive heart disease with heart failure: Secondary | ICD-10-CM | POA: Insufficient documentation

## 2023-09-28 DIAGNOSIS — M545 Low back pain, unspecified: Secondary | ICD-10-CM | POA: Diagnosis present

## 2023-09-28 DIAGNOSIS — G8929 Other chronic pain: Secondary | ICD-10-CM | POA: Insufficient documentation

## 2023-09-28 DIAGNOSIS — Z720 Tobacco use: Secondary | ICD-10-CM | POA: Insufficient documentation

## 2023-09-28 DIAGNOSIS — I509 Heart failure, unspecified: Secondary | ICD-10-CM | POA: Insufficient documentation

## 2023-09-28 DIAGNOSIS — I2510 Atherosclerotic heart disease of native coronary artery without angina pectoris: Secondary | ICD-10-CM | POA: Insufficient documentation

## 2023-09-28 DIAGNOSIS — M5441 Lumbago with sciatica, right side: Secondary | ICD-10-CM | POA: Insufficient documentation

## 2023-09-28 LAB — BASIC METABOLIC PANEL WITH GFR
Anion gap: 7 (ref 5–15)
BUN: 21 mg/dL — ABNORMAL HIGH (ref 6–20)
CO2: 25 mmol/L (ref 22–32)
Calcium: 8.7 mg/dL — ABNORMAL LOW (ref 8.9–10.3)
Chloride: 105 mmol/L (ref 98–111)
Creatinine, Ser: 0.84 mg/dL (ref 0.61–1.24)
GFR, Estimated: >60 mL/min (ref >60)
Glucose, Bld: 114 mg/dL — ABNORMAL HIGH (ref 70–99)
Potassium: 4.1 mmol/L (ref 3.5–5.1)
Sodium: 137 mmol/L (ref 135–145)

## 2023-09-28 LAB — CBC
HCT: 45.6 % (ref 39.0–52.0)
Hemoglobin: 15.1 g/dL (ref 13.0–17.0)
MCH: 31.9 pg (ref 26.0–34.0)
MCHC: 33.1 g/dL (ref 30.0–36.0)
MCV: 96.2 fL (ref 80.0–100.0)
Platelets: 353 10*3/uL (ref 150–400)
RBC: 4.74 MIL/uL (ref 4.22–5.81)
RDW: 13.1 % (ref 11.5–15.5)
WBC: 8.7 10*3/uL (ref 4.0–10.5)
nRBC: 0 % (ref 0.0–0.2)

## 2023-09-28 MED ORDER — CYCLOBENZAPRINE HCL 10 MG PO TABS: 10.0000 mg | ORAL_TABLET | Freq: Three times a day (TID) | ORAL | 0 refills | Status: AC | PRN

## 2023-09-28 NOTE — Discharge Instructions (Addendum)
 Please use ibuprofen (Motrin) up to 800 mg every 8 hours, naproxen (Naprosyn) up to 500 mg every 12 hours, and/or acetaminophen (Tylenol) up to 4 g/day for any continued pain.  Please do not use this medication regimen for longer than 7 days

## 2023-09-28 NOTE — ED Provider Notes (Signed)
 [facility] Provider Note   Event Date/Time   First MD Initiated Contact with Patient 09/28/23 1258     (approximate) History  Back Pain  HPI Brian Porter is a 58 y.o. male with a past medical history of hypertension, CAD, CHF, and persistent tobacco abuse who presents for right-sided lumbar back pain into the SI joints with associated shooting pains down the back of the right leg intermittently.  Patient states that this pain is worse when he gets down or sits up.  Patient denies any trauma preceding this pain. ROS: Patient currently denies any vision changes, tinnitus, difficulty speaking, facial droop, sore throat, chest pain, shortness of breath, abdominal pain, nausea/vomiting/diarrhea, dysuria, or weakness/numbness/paresthesias in any extremity   Physical Exam  Triage Vital Signs: ED Triage Vitals  Encounter Vitals Group     BP 09/28/23 1239 120/76     Systolic BP Percentile --      Diastolic BP Percentile --      Pulse Rate 09/28/23 1239 80     Resp 09/28/23 1239 18     Temp 09/28/23 1238 98 F (36.7 C)     Temp Source 09/28/23 1238 Oral     SpO2 09/28/23 1239 95 %     Weight 09/28/23 1238 248 lb (112.5 kg)     Height 09/28/23 1238 5\' 10"  (1.778 m)     Head Circumference --      Peak Flow --      Pain Score 09/28/23 1238 7     Pain Loc --      Pain Education --      Exclude from Growth Chart --    Most recent vital signs: Vitals:   09/28/23 1239 09/28/23 1300  BP: 120/76 116/86  Pulse: 80 76  Resp: 18 16  Temp:    SpO2: 95% 97%   General: Awake, oriented x4. CV:  Good peripheral perfusion.  Resp:  Normal effort.  Abd:  No distention.  Other:  Tenderness to palpation overlying the right aspect of the lumbar spine/SI joint.  Resting comfortably in no acute distress ED Results / Procedures / Treatments  Labs (all labs ordered are listed, but only abnormal results are displayed) Labs Reviewed  BASIC METABOLIC PANEL WITH GFR -  Abnormal; Notable for the following components:      Result Value   Glucose, Bld 114 (*)    BUN 21 (*)    Calcium 8.7 (*)    All other components within normal limits  CBC  URINALYSIS, ROUTINE W REFLEX MICROSCOPIC   PROCEDURES: Critical Care performed: No Procedures MEDICATIONS ORDERED IN ED: Medications - No data to display IMPRESSION / MDM / ASSESSMENT AND PLAN / ED COURSE  I reviewed the triage vital signs and the nursing notes.                             The patient is on the cardiac monitor to evaluate for evidence of arrhythmia and/or significant heart rate changes. Patient's presentation is most consistent with acute presentation with potential threat to life or bodily function. Patient presents for low back pain. Given History and Exam the patient appears to be at low risk for Spinal Cord Compression Syndrome, Vertebral Malignancy/Mets, acute Spinal Fracture, Vertebral Osteomyelitis, Epidural Abscess, Infected or Obstructing Kidney Stone.  Their presentation appears most likely to be secondary to non-emergent musculoskeletal etiology vs non-emergent disc herniation.  ED Workup: Defer imaging  and labwork for outpatient follow up at this time.  Disposition: Discharge. Strict return precautions discussed with patient with full understanding. Advised patient to follow up promptly with primary care provider   FINAL CLINICAL IMPRESSION(S) / ED DIAGNOSES   Final diagnoses:  Chronic right-sided low back pain with right-sided sciatica   Rx / DC Orders   ED Discharge Orders          Ordered    Ambulatory Referral to Primary Care (Establish Care)        09/28/23 1331    cyclobenzaprine (FLEXERIL) 10 MG tablet  3 times daily PRN        09/28/23 1332           Note:  This document was prepared using Dragon voice recognition software and may include unintentional dictation errors.   Merwyn Katos, MD 09/28/23 [phone]

## 2023-09-28 NOTE — ED Triage Notes (Signed)
 Patient to ED for lower back pain. Ongoing for "a while" but states radiates down lower left leg and worse today. States also having some dizziness.

## 2023-10-19 ENCOUNTER — Emergency Department
Admission: EM | Admit: 2023-10-19 | Discharge: 2023-10-19 | Disposition: A | Discharge status: Home / Self Care | Payer: MEDICAID

## 2023-10-19 ENCOUNTER — Emergency Department
Admission: EM | Admit: 2023-10-19 | Discharge: 2023-10-19 | Disposition: A | Discharge status: Home / Self Care | Payer: MEDICAID | Attending: Emergency Medicine | Admitting: Emergency Medicine

## 2023-10-19 ENCOUNTER — Emergency Department: Payer: MEDICAID

## 2023-10-19 ENCOUNTER — Other Ambulatory Visit: Payer: Self-pay

## 2023-10-19 DIAGNOSIS — I110 Hypertensive heart disease with heart failure: Secondary | ICD-10-CM | POA: Insufficient documentation

## 2023-10-19 DIAGNOSIS — J449 Chronic obstructive pulmonary disease, unspecified: Secondary | ICD-10-CM | POA: Diagnosis not present

## 2023-10-19 DIAGNOSIS — J441 Chronic obstructive pulmonary disease with (acute) exacerbation: Secondary | ICD-10-CM

## 2023-10-19 DIAGNOSIS — Z87891 Personal history of nicotine dependence: Secondary | ICD-10-CM | POA: Insufficient documentation

## 2023-10-19 DIAGNOSIS — R059 Cough, unspecified: Secondary | ICD-10-CM | POA: Diagnosis present

## 2023-10-19 DIAGNOSIS — J069 Acute upper respiratory infection, unspecified: Secondary | ICD-10-CM | POA: Insufficient documentation

## 2023-10-19 DIAGNOSIS — I4891 Unspecified atrial fibrillation: Secondary | ICD-10-CM | POA: Insufficient documentation

## 2023-10-19 DIAGNOSIS — I509 Heart failure, unspecified: Secondary | ICD-10-CM | POA: Insufficient documentation

## 2023-10-19 DIAGNOSIS — I2510 Atherosclerotic heart disease of native coronary artery without angina pectoris: Secondary | ICD-10-CM | POA: Insufficient documentation

## 2023-10-19 LAB — CBC WITH DIFFERENTIAL/PLATELET
Abs Immature Granulocytes: 0.03 10*3/uL (ref 0.00–0.07)
Basophils Absolute: 0.1 10*3/uL (ref 0.0–0.1)
Basophils Relative: 1 %
Eosinophils Absolute: 0.7 10*3/uL — ABNORMAL HIGH (ref 0.0–0.5)
Eosinophils Relative: 6 %
HCT: 44.1 % (ref 39.0–52.0)
Hemoglobin: 14.9 g/dL (ref 13.0–17.0)
Immature Granulocytes: 0 %
Lymphocytes Relative: 23 %
Lymphs Abs: 2.8 10*3/uL (ref 0.7–4.0)
MCH: 32.1 pg (ref 26.0–34.0)
MCHC: 33.8 g/dL (ref 30.0–36.0)
MCV: 95 fL (ref 80.0–100.0)
Monocytes Absolute: 2.2 10*3/uL — ABNORMAL HIGH (ref 0.1–1.0)
Monocytes Relative: 18 %
Neutro Abs: 6.4 10*3/uL (ref 1.7–7.7)
Neutrophils Relative %: 52 %
Platelets: 356 10*3/uL (ref 150–400)
RBC: 4.64 MIL/uL (ref 4.22–5.81)
RDW: 13.2 % (ref 11.5–15.5)
WBC: 12.2 10*3/uL — ABNORMAL HIGH (ref 4.0–10.5)
nRBC: 0 % (ref 0.0–0.2)

## 2023-10-19 LAB — COMPREHENSIVE METABOLIC PANEL WITH GFR
ALT: 21 U/L (ref 0–44)
AST: 18 U/L (ref 15–41)
Albumin: 3.5 g/dL (ref 3.5–5.0)
Alkaline Phosphatase: 41 U/L (ref 38–126)
Anion gap: 7 (ref 5–15)
BUN: 22 mg/dL — ABNORMAL HIGH (ref 6–20)
CO2: 26 mmol/L (ref 22–32)
Calcium: 8.4 mg/dL — ABNORMAL LOW (ref 8.9–10.3)
Chloride: 104 mmol/L (ref 98–111)
Creatinine, Ser: 0.9 mg/dL (ref 0.61–1.24)
GFR, Estimated: >60 mL/min (ref >60)
Glucose, Bld: 114 mg/dL — ABNORMAL HIGH (ref 70–99)
Potassium: 3.9 mmol/L (ref 3.5–5.1)
Sodium: 137 mmol/L (ref 135–145)
Total Bilirubin: 0.8 mg/dL (ref 0.0–1.2)
Total Protein: 6.9 g/dL (ref 6.5–8.1)

## 2023-10-19 LAB — RESP PANEL BY RT-PCR (RSV, FLU A&B, COVID)  RVPGX2
Influenza A by PCR: NEGATIVE
Influenza B by PCR: NEGATIVE
Resp Syncytial Virus by PCR: NEGATIVE
SARS Coronavirus 2 by RT PCR: NEGATIVE

## 2023-10-19 LAB — TROPONIN I (HIGH SENSITIVITY): Troponin I (High Sensitivity): 15 ng/L (ref <18)

## 2023-10-19 LAB — GROUP A STREP BY PCR: Group A Strep by PCR: NOT DETECTED

## 2023-10-19 MED ORDER — PREDNISONE 10 MG PO TABS: 40.0000 mg | ORAL_TABLET | Freq: Every day | ORAL | 0 refills | Status: AC

## 2023-10-19 MED ORDER — ACETAMINOPHEN 325 MG PO TABS
650.0000 mg | ORAL_TABLET | Freq: Once | ORAL | Status: AC
  Administered 2023-10-19: 650 mg via ORAL
  Filled 2023-10-19: qty 2

## 2023-10-19 MED ORDER — IPRATROPIUM-ALBUTEROL 0.5-2.5 (3) MG/3ML IN SOLN
3.0000 mL | Freq: Once | RESPIRATORY_TRACT | Status: AC
  Administered 2023-10-19: 3 mL via RESPIRATORY_TRACT
  Filled 2023-10-19: qty 3

## 2023-10-19 MED ORDER — HYDROCOD POLI-CHLORPHE POLI ER 10-8 MG/5ML PO SUER: 5.0000 mL | Freq: Two times a day (BID) | ORAL | 0 refills | Status: DC | PRN

## 2023-10-19 MED ORDER — PREDNISONE 20 MG PO TABS
50.0000 mg | ORAL_TABLET | Freq: Once | ORAL | Status: AC
  Administered 2023-10-19: 50 mg via ORAL
  Filled 2023-10-19: qty 3

## 2023-10-19 MED ORDER — AZITHROMYCIN 250 MG PO TABS: ORAL_TABLET | ORAL | 0 refills | Status: DC

## 2023-10-19 MED ORDER — ALBUTEROL SULFATE HFA 108 (90 BASE) MCG/ACT IN AERS: 2.0000 | INHALATION_SPRAY | Freq: Four times a day (QID) | RESPIRATORY_TRACT | 2 refills | Status: AC | PRN

## 2023-10-19 NOTE — ED Triage Notes (Signed)
 Pt to ED via POV c/o sore throat, cough, congestion. Started last night. Denies any fevers. Denies CP, SHOB, dizziness

## 2023-10-19 NOTE — Discharge Instructions (Addendum)
 Call make an appointment with your primary care provider if any continued problems or concerns. Prescriptions for albuterol  inhaler to use every 6 hours as needed for wheezing or shortness of breath.  Also the Tussionex for cough is every 12 hours.  Decrease the amount of time that you are outdoors which may be triggering your sore throat and coughing.  If any severe worsening of your symptoms return to the emergency department immediately.

## 2023-10-19 NOTE — Discharge Instructions (Addendum)
 Your evaluation in the emergency department was overall reassuring.  I do suspect you likely have a viral illness, and as discussed it is possible you may have COPD.  I prescribed you a short course of steroids to help with your recent cough and possible wheezing earlier.  Continue with the cough medication as already prescribed, and please do follow-up with your primary care doctor for reevaluation.  Return to the emergency department with any new or worsening symptoms.

## 2023-10-19 NOTE — ED Provider Notes (Signed)
 [facility] Provider Note    Event Date/Time   First MD Initiated Contact with Patient 10/19/23 1444     (approximate)   History   Cough  Pt via POV from home. Pt c/o cough. Pt was seen here this morning. Reports everything was negative was sent home with Tussionex and Albuterol  inhaler. States he has only taken one dose of the Tussionex and his cough is getting worse. Pt is A&Ox4 and NAD, ambulatory to triage.    HPI Brian Porter is a 58 y.o. male  pmh chf, cad, afib, htn, hyperlipidemia, tobacco hx, prior cva p/w cough - seen in ED earlier today after presenting with cough.  Workup reassuring including chest x-ray, viral swab, laboratory test including troponin. - On my evaluation, patient states he developed runny nose, itchy throat, and significant cough over the past 24 hours.  Was prescribed a cough medication but felt this did not resolve his cough so comes back to emergency department for eval. - Denies any chest pain, significant shortness of breath  Per review of the visit from earlier today, COVID/flu/RSV testing negative, strep test negative.  CBC with very mild leukocytosis, nonspecific.  CMP normal.  Troponin normal.         Physical Exam   Triage Vital Signs: ED Triage Vitals  Encounter Vitals Group     BP 10/19/23 1427 (!) 148/87     Systolic BP Percentile --      Diastolic BP Percentile --      Pulse Rate 10/19/23 1427 87     Resp 10/19/23 1427 18     Temp 10/19/23 1427 98.6 F (37 C)     Temp Source 10/19/23 1427 Oral     SpO2 10/19/23 1427 94 %     Weight 10/19/23 1426 240 lb (108.9 kg)     Height 10/19/23 1426 5\' 9"  (1.753 m)     Head Circumference --      Peak Flow --      Pain Score 10/19/23 1426 0     Pain Loc --      Pain Education --      Exclude from Growth Chart --     Most recent vital signs: Vitals:   10/19/23 1427  BP: (!) 148/87  Pulse: 87  Resp: 18  Temp: 98.6 F (37 C)  SpO2: 94%      General: Awake, no distress.  CV:  Good peripheral perfusion. RRR, RP 2+ Resp:  Normal effort. CTAB with good airflow throughout, no wheezing Abd:  No distention. Nontender to deep palpation throughout    ED Results / Procedures / Treatments   Labs (all labs ordered are listed, but only abnormal results are displayed) Labs Reviewed - No data to display   EKG  N/a   RADIOLOGY  N/a   PROCEDURES:  Critical Care performed: No  Procedures   MEDICATIONS ORDERED IN ED: Medications  ipratropium-albuterol  (DUONEB) 0.5-2.5 (3) MG/3ML nebulizer solution 3 mL (3 mLs Nebulization Given 10/19/23 1512)  predniSONE  (DELTASONE ) tablet 50 mg (50 mg Oral Given 10/19/23 1511)     IMPRESSION / MDM / ASSESSMENT AND PLAN / ED COURSE  I reviewed the triage vital signs and the nursing notes.                              DDX/MDM/AP: Differential diagnosis includes, but is not limited to, likely viral URI.  Consider  component of COPD exacerbation the patient does not carry this formal diagnosis.  Already had appropriate pulmonary and cardiac workup earlier today, no indication for repeat at this time and is very well-appearing patient with viral URI symptoms.  Appears he is primarily frustrated at the persistence of his cough.  Is already on blood thinners and no concerning associated symptoms to suggest pulmonary embolism.  Discussed that he will likely continue to have cough for at least the next few days in the setting of what appears to be a URI.  Satting well appearing comfortable on my eval with normal work of breathing.  Shared decision making, patient would like to pursue 1 further round of duo nebulizer treatment and is amenable to a short course of steroids in addition to the albuterol  that he was prescribed earlier today.  Plan: - DuoNeb - Prednisone  - Anticipate discharge home with plan for PMD follow-up  Patient's presentation is most consistent with acute, uncomplicated  illness.  The patient is on the cardiac monitor to evaluate for evidence of arrhythmia and/or significant heart rate changes.  ED course below.  Patient feeling better after further round of DuoNeb and course of steroids.  Will start on short course of steroids for possible mild COPD exacerbation or bronchitis in the setting of what appears to be a viral URI.  No indication for repeat labs at this time.  Patient agrees with plan.  Plan for PMD follow-up.  ED return precautions in place.  Already prescribed albuterol  and has cough medication.      FINAL CLINICAL IMPRESSION(S) / ED DIAGNOSES   Final diagnoses:  Viral upper respiratory tract infection     Rx / DC Orders   ED Discharge Orders          Ordered    predniSONE  (DELTASONE ) 10 MG tablet  Daily        10/19/23 1539             Note:  This document was prepared using Dragon voice recognition software and may include unintentional dictation errors.   Collis Deaner, MD 10/19/23 [phone]

## 2023-10-19 NOTE — ED Notes (Signed)
 Pt came in for a sore throat with some congestion. Pt endorses a cough. Pt ambulatory to rm. A&Ox4. NAD noted. Respiratory assessment WDL.

## 2023-10-19 NOTE — ED Triage Notes (Addendum)
 Pt via POV from home. Pt c/o cough. Pt was seen here this morning. Reports everything was negative was sent home with Tussionex and Albuterol  inhaler. States he has only taken one dose of the Tussionex and his cough is getting worse. Pt is A&Ox4 and NAD, ambulatory to triage.

## 2023-10-19 NOTE — ED Provider Notes (Signed)
 [facility] Provider Note    Event Date/Time   First MD Initiated Contact with Patient 10/19/23 [phone]     (approximate)   History   Sore Throat and Cough   HPI  Brian Porter is a 58 y.o. male   presents to the ED with complaint of sore throat, cough, congestion that started last evening.  Patient denies any fever or chills.  He is unaware of any known sick contacts.  He denies any shortness of breath or dizziness.  Patient has a history of hypertension, CAD, CHF, GERD, non-STEMI 2023, CVA, former smoker quitting in 2022.      Physical Exam   Triage Vital Signs: ED Triage Vitals  Encounter Vitals Group     BP 10/19/23 0638 128/87     Systolic BP Percentile --      Diastolic BP Percentile --      Pulse Rate 10/19/23 0638 83     Resp 10/19/23 0638 16     Temp 10/19/23 0638 98.4 F (36.9 C)     Temp Source 10/19/23 0638 Oral     SpO2 10/19/23 0638 95 %     Weight --      Height --      Head Circumference --      Peak Flow --      Pain Score 10/19/23 0627 5     Pain Loc --      Pain Education --      Exclude from Growth Chart --     Most recent vital signs: Vitals:   10/19/23 0930 10/19/23 1000  BP: 125/80 135/87  Pulse: 76 76  Resp: 17   Temp:    SpO2: 97% 97%     General: Awake, no distress.  Able to speak in complete sentences without any difficulty. CV:  Good peripheral perfusion.  Heart regular rate rhythm. Resp:  Normal effort.  Lungs with bilateral expiratory wheeze.  Occasional cough is noted. Abd:  No distention.  Other:  Posterior pharynx without erythema or exudate.  Uvula is midline.   ED Results / Procedures / Treatments   Labs (all labs ordered are listed, but only abnormal results are displayed) Labs Reviewed  CBC WITH DIFFERENTIAL/PLATELET - Abnormal; Notable for the following components:      Result Value   WBC 12.2 (*)    Monocytes Absolute 2.2 (*)    Eosinophils Absolute 0.7 (*)    All other components  within normal limits  COMPREHENSIVE METABOLIC PANEL WITH GFR - Abnormal; Notable for the following components:   Glucose, Bld 114 (*)    BUN 22 (*)    Calcium  8.4 (*)    All other components within normal limits  GROUP A STREP BY PCR  RESP PANEL BY RT-PCR (RSV, FLU A&B, COVID)  [id number]  TROPONIN I (HIGH SENSITIVITY)     EKG  Vent. rate 74 BPM PR interval 182 ms QRS duration 116 ms QT/QTcB 426/472 ms P-R-T axes 65 -72 23 Sinus rhythm with Premature atrial complexes with Abberant conduction Left anterior fascicular block Minimal voltage criteria for LVH, may be normal variant ( Cornell product ) Abnormal ECG When compared with ECG of 28-Sep-2023 12:42, Abberant conduction is now Present Left anterior fascicular block is now Present   RADIOLOGY Chest x-ray images reviewed by myself independent of the radiologist and no infiltrate was noted.  Official radiology report mentions changes consistent with COPD.  No acute cardiopulmonary changes.    PROCEDURES:  Critical Care performed:   Procedures   MEDICATIONS ORDERED IN ED: Medications  ipratropium-albuterol  (DUONEB) 0.5-2.5 (3) MG/3ML nebulizer solution 3 mL (3 mLs Nebulization Given 10/19/23 0741)  acetaminophen  (TYLENOL ) tablet 650 mg (650 mg Oral Given 10/19/23 0833)  ipratropium-albuterol  (DUONEB) 0.5-2.5 (3) MG/3ML nebulizer solution 3 mL (3 mLs Nebulization Given 10/19/23 0913)     IMPRESSION / MDM / ASSESSMENT AND PLAN / ED COURSE  I reviewed the triage vital signs and the nursing notes.   Differential diagnosis includes, but is not limited to, COVID, influenza, RSV, pneumonia, bronchitis, exacerbation of COPD, exacerbation CHF, seasonal allergies, viral URI with cough.  58 year old male presents to the ED with several days of sore throat, cough, congestion and also noticed some wheezing last evening.  Patient has not been taking any over-the-counter medication and was not aware that he had been diagnosed with  COPD.  In looking back through his records he has been diagnosed and also given albuterol  inhalers.  Lab work is reassuring with troponin 15, respiratory panel negative, CBC and CMP unremarkable.  Patient was made aware that also his chest x-ray suggest that he does have COPD.  He is encouraged to follow-up with his PCP if any continued problems.  He was given DuoNeb treatments in the emergency department which cleared his wheezing and patient reported that he felt better.  A prescription for Tussionex and albuterol  inhaler was sent to the pharmacy for him to continue taking.  He is encouraged to return to the emergency department if any severe worsening of his symptoms otherwise follow-up with his PCP.      Patient's presentation is most consistent with acute illness / injury with system symptoms.  FINAL CLINICAL IMPRESSION(S) / ED DIAGNOSES   Final diagnoses:  Viral URI with cough  Acute exacerbation of chronic obstructive pulmonary disease (COPD) (HCC)     Rx / DC Orders   ED Discharge Orders          Ordered    chlorpheniramine-HYDROcodone  (TUSSIONEX) 10-8 MG/5ML  Every 12 hours PRN        10/19/23 0954    albuterol  (VENTOLIN  HFA) 108 (90 Base) MCG/ACT inhaler  Every 6 hours PRN        10/19/23 0954    azithromycin  (ZITHROMAX  Z-PAK) 250 MG tablet  Status:  Discontinued        10/19/23 0954             Note:  This document was prepared using Dragon voice recognition software and may include unintentional dictation errors.   Stafford Eagles, PA-C 10/19/23 1025    Ruth Cove, MD 10/19/23 1239

## 2023-10-19 NOTE — ED Notes (Signed)
 See triage notes. Patient was seen earlier for same.

## 2023-10-24 ENCOUNTER — Emergency Department
Admission: EM | Admit: 2023-10-24 | Discharge: 2023-10-24 | Disposition: A | Discharge status: Home / Self Care | Payer: MEDICAID | Attending: Emergency Medicine | Admitting: Emergency Medicine

## 2023-10-24 ENCOUNTER — Emergency Department: Payer: MEDICAID

## 2023-10-24 ENCOUNTER — Other Ambulatory Visit: Payer: Self-pay

## 2023-10-24 DIAGNOSIS — I509 Heart failure, unspecified: Secondary | ICD-10-CM | POA: Diagnosis not present

## 2023-10-24 DIAGNOSIS — I2510 Atherosclerotic heart disease of native coronary artery without angina pectoris: Secondary | ICD-10-CM | POA: Insufficient documentation

## 2023-10-24 DIAGNOSIS — J209 Acute bronchitis, unspecified: Secondary | ICD-10-CM | POA: Diagnosis not present

## 2023-10-24 DIAGNOSIS — R600 Localized edema: Secondary | ICD-10-CM | POA: Diagnosis not present

## 2023-10-24 DIAGNOSIS — R059 Cough, unspecified: Secondary | ICD-10-CM | POA: Diagnosis present

## 2023-10-24 MED ORDER — AZITHROMYCIN 250 MG PO TABS
ORAL_TABLET | ORAL | 0 refills | Status: DC
Start: 2023-10-24 — End: 2024-02-18

## 2023-10-24 MED ORDER — PREDNISONE 20 MG PO TABS
40.0000 mg | ORAL_TABLET | Freq: Once | ORAL | Status: AC
  Administered 2023-10-24: 40 mg via ORAL
  Filled 2023-10-24: qty 2

## 2023-10-24 MED ORDER — IPRATROPIUM-ALBUTEROL 0.5-2.5 (3) MG/3ML IN SOLN
3.0000 mL | Freq: Once | RESPIRATORY_TRACT | Status: AC
  Administered 2023-10-24: 3 mL via RESPIRATORY_TRACT
  Filled 2023-10-24: qty 3

## 2023-10-24 MED ORDER — METHYLPREDNISOLONE 4 MG PO TABS: ORAL_TABLET | ORAL | 0 refills | Status: AC

## 2023-10-24 NOTE — ED Triage Notes (Signed)
 Pt to ED via POV c/o cough since Sunday. Pt was seen twice on Sunday for it. Was prescribed medication but has ran out and said that cough got worse today. Denies CP, SHOB

## 2023-10-24 NOTE — ED Provider Notes (Signed)
 [facility] Provider Note    Event Date/Time   First MD Initiated Contact with Patient 10/24/23 0403     (approximate)   History   Cough   HPI  Brian Porter is a 58 y.o. male with a history of CHF, atrial fibrillation, coronary disease  Patient reports that he feels like he just has a persistent cough.  It got better after taking steroid but after stopping it yesterday having run out, he reports the cough just seems like it is starting to slightly come back.  Started with a little bit of a sore throat cough congestion.  He reports that he was getting better but not seems to be just coming back but it is not worse and is still better than it was before  No chest pain.  He has leg swelling but reports that is normal and chronic for him no change from his normal.  Denies shortness of breath.  Reports just cough feels like he has a very slight persistent wheezing at times.  Uses an inhaler/albuterol  at his house occasionally which is helpful as well.  Patient reports he feels like he needs to be on a steroid just a little bit longer  No fevers or chills.  Cough is not productive and remittent he feels like he wheezes slightly.  Used to smoke until about 3 years ago     Physical Exam   Triage Vital Signs: ED Triage Vitals  Encounter Vitals Group     BP 10/24/23 0323 (!) 153/100     Systolic BP Percentile --      Diastolic BP Percentile --      Pulse Rate 10/24/23 0323 76     Resp 10/24/23 0323 18     Temp 10/24/23 0323 97.6 F (36.4 C)     Temp Source 10/24/23 0323 Oral     SpO2 10/24/23 0323 95 %     Weight --      Height --      Head Circumference --      Peak Flow --      Pain Score 10/24/23 0322 0     Pain Loc --      Pain Education --      Exclude from Growth Chart --     Most recent vital signs: Vitals:   10/24/23 0323  BP: (!) 153/100  Pulse: 76  Resp: 18  Temp: 97.6 F (36.4 C)  SpO2: 95%     General: Awake, no distress.   Sitting up pleasantly.  No acute distress CV:  Good peripheral perfusion.  Normal tones and rate Resp:  Normal effort.  No accessory muscle use.  Speaks in full clear sentences.  Very mild expiratory wheezing throughout lung fields.  No rales.  No focal lung abnormalities. Abd:  No distention.  Other:  Mild bilateral lower extremity peripheral edema.  No venous cords or congestion.  No unilateral edema   ED Results / Procedures / Treatments   Labs (all labs ordered are listed, but only abnormal results are displayed) Labs Reviewed - No data to display    RADIOLOGY  Chest x-ray interpreted by me is negative for acute infiltrate, pneumothorax or effusion    DG Chest 2 View Result Date: 10/24/2023 CLINICAL DATA:  Persistent cough EXAM: CHEST - 2 VIEW COMPARISON:  5 days prior FINDINGS: Chronic interstitial coarsening with bronchitic markings and emphysema by 2022 CT. Chronic cardiomegaly. There is no edema, consolidation, effusion, or pneumothorax.  No acute osseous finding IMPRESSION: Chronic lung disease without acute or interval finding. Electronically Signed   By: Ronnette Coke M.D.   On: 10/24/2023 05:03      PROCEDURES:  Critical Care performed: No  Procedures   MEDICATIONS ORDERED IN ED: Medications  predniSONE  (DELTASONE ) tablet 40 mg (40 mg Oral Given 10/24/23 0501)  ipratropium-albuterol  (DUONEB) 0.5-2.5 (3) MG/3ML nebulizer solution 3 mL (3 mLs Nebulization Given 10/24/23 0501)     IMPRESSION / MDM / ASSESSMENT AND PLAN / ED COURSE  I reviewed the triage vital signs and the nursing notes.                              Differential diagnosis includes, but is not limited to, persistent bronchitis, reactive airway disease/COPD like exacerbation, viral syndrome, less likely pneumonia but will obtain chest x-ray to evaluate for any focal infiltrate today, etc.  No symptoms of ACS.  No clinical symptoms or history that be suggestive of acute thromboembolism.  No  associated chest pains.  Does have mild peripheral edema but reports that to be chronic and unchanged in nature.  His symptoms do not worsen with being reclined and do not seem positional, clinical examination supports likely a bronchitis-like picture.    Patient's presentation is most consistent with acute, uncomplicated illness.        Patient was seen in the ER for cough about 5 days ago.  At that time he had reassuring workup including troponin, CBC flu strep   ----------------------------------------- 5:30 AM on 10/24/2023 ----------------------------------------- Patient reports he feels better.  He took nebulizer treatment and reports his breathing feels well.  Will discharge with steroid taper and azithromycin .  Discussed return precautions and treatment.  Patient agreeable with plan.  Resting comfortably stable, patient requesting discharge as well as he reports that he feels better and ready to go  Return precautions and treatment recommendations and follow-up discussed with the patient who is agreeable with the plan.   FINAL CLINICAL IMPRESSION(S) / ED DIAGNOSES   Final diagnoses:  Acute bronchitis, unspecified organism     Rx / DC Orders   ED Discharge Orders          Ordered    methylPREDNISolone  (MEDROL ) 4 MG tablet  Daily        10/24/23 0425    azithromycin  (ZITHROMAX ) 250 MG tablet        10/24/23 0603             Note:  This document was prepared using Dragon voice recognition software and may include unintentional dictation errors.   Iver Marker, MD 10/24/23 [phone]

## 2023-10-24 NOTE — ED Notes (Signed)
Pt reports feeling better after breathing tx

## 2024-02-17 ENCOUNTER — Other Ambulatory Visit: Payer: Self-pay

## 2024-02-17 ENCOUNTER — Emergency Department: Payer: MEDICAID

## 2024-02-17 ENCOUNTER — Inpatient Hospital Stay
Admission: EM | Admit: 2024-02-17 | Discharge: 2024-02-18 | DRG: 308 | Disposition: A | Discharge status: Home / Self Care | Payer: MEDICAID | Attending: Internal Medicine | Admitting: Internal Medicine

## 2024-02-17 DIAGNOSIS — Z823 Family history of stroke: Secondary | ICD-10-CM | POA: Diagnosis not present

## 2024-02-17 DIAGNOSIS — Z87891 Personal history of nicotine dependence: Secondary | ICD-10-CM

## 2024-02-17 DIAGNOSIS — R079 Chest pain, unspecified: Secondary | ICD-10-CM

## 2024-02-17 DIAGNOSIS — I4891 Unspecified atrial fibrillation: Secondary | ICD-10-CM | POA: Diagnosis not present

## 2024-02-17 DIAGNOSIS — Z7901 Long term (current) use of anticoagulants: Secondary | ICD-10-CM

## 2024-02-17 DIAGNOSIS — R0789 Other chest pain: Secondary | ICD-10-CM | POA: Diagnosis present

## 2024-02-17 DIAGNOSIS — Z8249 Family history of ischemic heart disease and other diseases of the circulatory system: Secondary | ICD-10-CM

## 2024-02-17 DIAGNOSIS — Z9081 Acquired absence of spleen: Secondary | ICD-10-CM | POA: Diagnosis not present

## 2024-02-17 DIAGNOSIS — G4733 Obstructive sleep apnea (adult) (pediatric): Secondary | ICD-10-CM | POA: Diagnosis present

## 2024-02-17 DIAGNOSIS — Z905 Acquired absence of kidney: Secondary | ICD-10-CM

## 2024-02-17 DIAGNOSIS — Z79899 Other long term (current) drug therapy: Secondary | ICD-10-CM | POA: Diagnosis not present

## 2024-02-17 DIAGNOSIS — I5022 Chronic systolic (congestive) heart failure: Secondary | ICD-10-CM

## 2024-02-17 DIAGNOSIS — J9601 Acute respiratory failure with hypoxia: Secondary | ICD-10-CM | POA: Diagnosis present

## 2024-02-17 DIAGNOSIS — I420 Dilated cardiomyopathy: Secondary | ICD-10-CM | POA: Diagnosis present

## 2024-02-17 DIAGNOSIS — Z8673 Personal history of transient ischemic attack (TIA), and cerebral infarction without residual deficits: Secondary | ICD-10-CM | POA: Diagnosis not present

## 2024-02-17 DIAGNOSIS — I110 Hypertensive heart disease with heart failure: Secondary | ICD-10-CM | POA: Diagnosis present

## 2024-02-17 DIAGNOSIS — G629 Polyneuropathy, unspecified: Secondary | ICD-10-CM | POA: Diagnosis present

## 2024-02-17 DIAGNOSIS — Z560 Unemployment, unspecified: Secondary | ICD-10-CM | POA: Diagnosis not present

## 2024-02-17 DIAGNOSIS — Z888 Allergy status to other drugs, medicaments and biological substances status: Secondary | ICD-10-CM

## 2024-02-17 DIAGNOSIS — I2510 Atherosclerotic heart disease of native coronary artery without angina pectoris: Secondary | ICD-10-CM | POA: Diagnosis present

## 2024-02-17 DIAGNOSIS — Z833 Family history of diabetes mellitus: Secondary | ICD-10-CM

## 2024-02-17 DIAGNOSIS — K219 Gastro-esophageal reflux disease without esophagitis: Secondary | ICD-10-CM | POA: Diagnosis present

## 2024-02-17 DIAGNOSIS — Z955 Presence of coronary angioplasty implant and graft: Secondary | ICD-10-CM | POA: Diagnosis not present

## 2024-02-17 DIAGNOSIS — E785 Hyperlipidemia, unspecified: Secondary | ICD-10-CM | POA: Diagnosis present

## 2024-02-17 DIAGNOSIS — I5023 Acute on chronic systolic (congestive) heart failure: Secondary | ICD-10-CM

## 2024-02-17 DIAGNOSIS — I5043 Acute on chronic combined systolic (congestive) and diastolic (congestive) heart failure: Secondary | ICD-10-CM | POA: Diagnosis present

## 2024-02-17 DIAGNOSIS — Z7982 Long term (current) use of aspirin: Secondary | ICD-10-CM | POA: Diagnosis not present

## 2024-02-17 DIAGNOSIS — D72829 Elevated white blood cell count, unspecified: Secondary | ICD-10-CM | POA: Diagnosis present

## 2024-02-17 DIAGNOSIS — I252 Old myocardial infarction: Secondary | ICD-10-CM | POA: Diagnosis not present

## 2024-02-17 DIAGNOSIS — I480 Paroxysmal atrial fibrillation: Secondary | ICD-10-CM | POA: Diagnosis present

## 2024-02-17 DIAGNOSIS — I5021 Acute systolic (congestive) heart failure: Secondary | ICD-10-CM | POA: Diagnosis not present

## 2024-02-17 DIAGNOSIS — I10 Essential (primary) hypertension: Secondary | ICD-10-CM | POA: Diagnosis present

## 2024-02-17 LAB — COMPREHENSIVE METABOLIC PANEL WITH GFR
ALT: 20 U/L (ref 0–44)
AST: 23 U/L (ref 15–41)
Albumin: 4.1 g/dL (ref 3.5–5.0)
Alkaline Phosphatase: 50 U/L (ref 38–126)
Anion gap: 9 (ref 5–15)
BUN: 19 mg/dL (ref 6–20)
CO2: 25 mmol/L (ref 22–32)
Calcium: 9 mg/dL (ref 8.9–10.3)
Chloride: 104 mmol/L (ref 98–111)
Creatinine, Ser: 1.04 mg/dL (ref 0.61–1.24)
GFR, Estimated: >60 mL/min (ref >60)
Glucose, Bld: 106 mg/dL — ABNORMAL HIGH (ref 70–99)
Potassium: 3.5 mmol/L (ref 3.5–5.1)
Sodium: 138 mmol/L (ref 135–145)
Total Bilirubin: 1.8 mg/dL — ABNORMAL HIGH (ref 0.0–1.2)
Total Protein: 7.9 g/dL (ref 6.5–8.1)

## 2024-02-17 LAB — CBC
HCT: 45.7 % (ref 39.0–52.0)
Hemoglobin: 15.4 g/dL (ref 13.0–17.0)
MCH: 31.6 pg (ref 26.0–34.0)
MCHC: 33.7 g/dL (ref 30.0–36.0)
MCV: 93.6 fL (ref 80.0–100.0)
Platelets: 340 K/uL (ref 150–400)
RBC: 4.88 MIL/uL (ref 4.22–5.81)
RDW: 13.2 % (ref 11.5–15.5)
WBC: 12.6 K/uL — ABNORMAL HIGH (ref 4.0–10.5)
nRBC: 0 % (ref 0.0–0.2)

## 2024-02-17 LAB — APTT: aPTT: 27 s (ref 24–36)

## 2024-02-17 LAB — TROPONIN I (HIGH SENSITIVITY)
Troponin I (High Sensitivity): 32 ng/L — ABNORMAL HIGH (ref <18)
Troponin I (High Sensitivity): 36 ng/L — ABNORMAL HIGH (ref <18)

## 2024-02-17 LAB — PROTIME-INR
INR: 1 (ref 0.8–1.2)
Prothrombin Time: 13.7 s (ref 11.4–15.2)

## 2024-02-17 LAB — BRAIN NATRIURETIC PEPTIDE: B Natriuretic Peptide: 320.5 pg/mL — ABNORMAL HIGH (ref 0.0–100.0)

## 2024-02-17 MED ORDER — TRAZODONE HCL 50 MG PO TABS: 25.0000 mg | ORAL_TABLET | Freq: Every evening | ORAL | Status: DC | PRN

## 2024-02-17 MED ORDER — MAGNESIUM HYDROXIDE 400 MG/5ML PO SUSP
30.0000 mL | Freq: Every day | ORAL | Status: DC | PRN
Start: 2024-02-17 — End: 2024-02-18

## 2024-02-17 MED ORDER — HEPARIN (PORCINE) 25000 UT/250ML-% IV SOLN
1650.0000 [IU]/h | INTRAVENOUS | Status: DC
  Administered 2024-02-17: 1450 [IU]/h via INTRAVENOUS
  Administered 2024-02-18: 1650 [IU]/h via INTRAVENOUS
  Filled 2024-02-17 (×2): qty 250

## 2024-02-17 MED ORDER — ASPIRIN 81 MG PO TBEC
81.0000 mg | DELAYED_RELEASE_TABLET | Freq: Every day | ORAL | Status: DC
  Administered 2024-02-18: 81 mg via ORAL
  Filled 2024-02-17: qty 1

## 2024-02-17 MED ORDER — ISOSORBIDE MONONITRATE ER 60 MG PO TB24
30.0000 mg | ORAL_TABLET | Freq: Every day | ORAL | Status: DC
  Administered 2024-02-18: 30 mg via ORAL
  Filled 2024-02-17: qty 1

## 2024-02-17 MED ORDER — MORPHINE SULFATE (PF) 2 MG/ML IV SOLN
2.0000 mg | Freq: Once | INTRAVENOUS | Status: AC
  Administered 2024-02-17: 2 mg via INTRAVENOUS
  Filled 2024-02-17: qty 1

## 2024-02-17 MED ORDER — ASPIRIN 81 MG PO CHEW
81.0000 mg | CHEWABLE_TABLET | Freq: Once | ORAL | Status: AC
  Administered 2024-02-17: 81 mg via ORAL
  Filled 2024-02-17: qty 1

## 2024-02-17 MED ORDER — ACETAMINOPHEN 325 MG PO TABS
650.0000 mg | ORAL_TABLET | Freq: Four times a day (QID) | ORAL | Status: DC | PRN
  Administered 2024-02-18: 650 mg via ORAL
  Filled 2024-02-17: qty 2

## 2024-02-17 MED ORDER — METOPROLOL TARTRATE 5 MG/5ML IV SOLN
5.0000 mg | Freq: Once | INTRAVENOUS | Status: AC
  Administered 2024-02-17: 5 mg via INTRAVENOUS
  Filled 2024-02-17: qty 5

## 2024-02-17 MED ORDER — ALBUTEROL SULFATE (2.5 MG/3ML) 0.083% IN NEBU: 2.5000 mg | INHALATION_SOLUTION | Freq: Four times a day (QID) | RESPIRATORY_TRACT | Status: DC | PRN

## 2024-02-17 MED ORDER — ONDANSETRON HCL 4 MG/2ML IJ SOLN: 4.0000 mg | Freq: Four times a day (QID) | INTRAMUSCULAR | Status: DC | PRN

## 2024-02-17 MED ORDER — METOPROLOL TARTRATE 25 MG PO TABS
25.0000 mg | ORAL_TABLET | Freq: Once | ORAL | Status: AC
  Administered 2024-02-17: 25 mg via ORAL
  Filled 2024-02-17: qty 1

## 2024-02-17 MED ORDER — HEPARIN BOLUS VIA INFUSION
5900.0000 [IU] | Freq: Once | INTRAVENOUS | Status: AC
  Administered 2024-02-17: 5900 [IU] via INTRAVENOUS
  Filled 2024-02-17: qty 5900

## 2024-02-17 MED ORDER — FUROSEMIDE 10 MG/ML IJ SOLN
40.0000 mg | Freq: Once | INTRAMUSCULAR | Status: AC
  Administered 2024-02-17: 40 mg via INTRAVENOUS
  Filled 2024-02-17: qty 4

## 2024-02-17 MED ORDER — FUROSEMIDE 10 MG/ML IJ SOLN
40.0000 mg | Freq: Two times a day (BID) | INTRAMUSCULAR | Status: DC
  Administered 2024-02-18: 40 mg via INTRAVENOUS
  Filled 2024-02-17: qty 4

## 2024-02-17 MED ORDER — ACETAMINOPHEN 650 MG RE SUPP: 650.0000 mg | Freq: Four times a day (QID) | RECTAL | Status: DC | PRN

## 2024-02-17 MED ORDER — POTASSIUM CHLORIDE CRYS ER 20 MEQ PO TBCR
10.0000 meq | EXTENDED_RELEASE_TABLET | Freq: Every day | ORAL | Status: DC
  Administered 2024-02-17 – 2024-02-18 (×2): 10 meq via ORAL
  Filled 2024-02-17 (×2): qty 1

## 2024-02-17 MED ORDER — METOPROLOL SUCCINATE ER 50 MG PO TB24
50.0000 mg | ORAL_TABLET | Freq: Every day | ORAL | Status: DC
  Administered 2024-02-18: 50 mg via ORAL
  Filled 2024-02-17: qty 1

## 2024-02-17 MED ORDER — CYCLOBENZAPRINE HCL 10 MG PO TABS
ORAL_TABLET | ORAL | Status: AC
  Filled 2024-02-17: qty 1

## 2024-02-17 MED ORDER — GABAPENTIN 100 MG PO CAPS
100.0000 mg | ORAL_CAPSULE | Freq: Three times a day (TID) | ORAL | Status: DC
  Administered 2024-02-17 – 2024-02-18 (×2): 100 mg via ORAL
  Filled 2024-02-17 (×2): qty 1

## 2024-02-17 MED ORDER — CYCLOBENZAPRINE HCL 10 MG PO TABS
10.0000 mg | ORAL_TABLET | Freq: Three times a day (TID) | ORAL | Status: DC | PRN
  Administered 2024-02-17 – 2024-02-18 (×2): 10 mg via ORAL
  Filled 2024-02-17: qty 1

## 2024-02-17 MED ORDER — ONDANSETRON HCL 4 MG PO TABS: 4.0000 mg | ORAL_TABLET | Freq: Four times a day (QID) | ORAL | Status: DC | PRN

## 2024-02-17 MED ORDER — HYDROCOD POLI-CHLORPHE POLI ER 10-8 MG/5ML PO SUER: 5.0000 mL | Freq: Two times a day (BID) | ORAL | Status: DC | PRN

## 2024-02-17 MED ORDER — SPIRONOLACTONE 25 MG PO TABS
25.0000 mg | ORAL_TABLET | Freq: Every day | ORAL | Status: DC
  Administered 2024-02-18: 25 mg via ORAL
  Filled 2024-02-17: qty 1

## 2024-02-17 NOTE — Assessment & Plan Note (Signed)
 Will continue statin therapy

## 2024-02-17 NOTE — Consult Note (Signed)
 Pharmacy Consult Note - Anticoagulation  Pharmacy Consult for heparin  Indication: atrial fibrillation  PATIENT MEASUREMENTS: Height: 5' 10 (177.8 cm) Weight: 113.4 kg (250 lb) IBW/kg (Calculated) : 73 HEPARIN  DW (KG): 97.9  VITAL SIGNS: Temp: 98 F (36.7 C) (08/19 1833) Temp Source: Oral (08/19 1833) BP: 103/74 (08/19 1943) Pulse Rate: 115 (08/19 1943)  Recent Labs    02/17/24 1833 02/17/24 1843  HGB 15.4  --   HCT 45.7  --   PLT 340  --   LABPROT  --  13.7  INR  --  1.0  CREATININE 1.04  --   TROPONINIHS 32*  --     Estimated Creatinine Clearance: 96.5 mL/min (by C-G formula based on SCr of 1.04 mg/dL).  PAST MEDICAL HISTORY: Past Medical History:  Diagnosis Date   Acute on chronic systolic heart failure (HCC) 08/27/2016   CAD (coronary artery disease)    CHF (congestive heart failure) (HCC)    CVA (cerebral vascular accident) (HCC) 10/21/2020   GERD (gastroesophageal reflux disease)    Hypertension    MI, old    NSTEMI (non-ST elevated myocardial infarction) (HCC) 02/14/2022   Sleep apnea    Tobacco abuse 11/02/2015    ASSESSMENT: 58 y.o. male with PMH including HTN, prediabetes, hx CVA, PAD is presenting with new onset atrial fibrillation. HR currently 105-130 and ECG shows Afib with RVR. CHA2DS2VASc is 5 (CHF, HTN, CVA+2, CAD/NSTEMI) and patient is not on chronic anticoagulation per chart review. Pharmacy has been consulted to initiate and manage heparin  intravenous infusion.  Pertinent medications: No chronic anticoag PTA per chart review  Goal(s) of therapy: Heparin  level 0.3 - 0.7 units/mL Monitor platelets by anticoagulation protocol: Yes   Baseline anticoagulation labs: Recent Labs    02/17/24 1833 02/17/24 1843  INR  --  1.0  HGB 15.4  --   PLT 340  --     Date Time aPTT/HL Rate/Comment     PLAN: Give 5900 units bolus x1; then start heparin  infusion at 1450 units/hour. Check heparin  level in 6 hours, then daily once at least two  levels are consecutively therapeutic. Monitor CBC daily while on heparin  infusion.   Will M. Lenon, PharmD Clinical Pharmacist 02/17/2024 7:47 PM

## 2024-02-17 NOTE — Assessment & Plan Note (Signed)
-   Will continue Neurontin .

## 2024-02-17 NOTE — Assessment & Plan Note (Signed)
-   The patient was admitted to a progressive unit bed. - Will continue Toprol -XL for now. - Will utilize as needed IV Lopressor  and Cardizem . - Will continue IV heparin  for now which can be converted to p.o. Eliquis  towards discharge. - Will follow serial troponin. - Chest pain is likely secondary to his atrial fibrillation.

## 2024-02-17 NOTE — ED Notes (Signed)
Mansy MD at bedside  

## 2024-02-17 NOTE — ED Notes (Signed)
 Jessup MD made aware pt HR 115-130s after metoprolol . MD also aware pt O2 89-91% on RA, placed on 2L via [place].

## 2024-02-17 NOTE — Assessment & Plan Note (Addendum)
-   We will obtain a 2D echo as the last one was in 2023 and revealed EF of 35 to 40% with grade 1 diastolic dysfunction and mild mitral valve regurgitation. - Cardiology consult will be obtained. - I notified Dr. Florencio about the patient. - Will place him on diuresis with IV Lasix  and continue Aldactone  as well as Toprol -XL.

## 2024-02-17 NOTE — ED Triage Notes (Signed)
 Pt sts that he has been having chest pain for the last three days. Pt sts that the pain has been getting worse with fatigue. Pt took 325mg  of aspirin  prior to coming to the ED.

## 2024-02-17 NOTE — H&P (Addendum)
 [place]   PATIENT NAME: Brian Porter    MR#:  [id number]  DATE OF BIRTH:  1965-11-04  DATE OF ADMISSION:  02/17/2024  PRIMARY CARE PHYSICIAN: [facility]   Patient is coming from: Home  REQUESTING/REFERRING PHYSICIAN: Willo Dunnings, MD  CHIEF COMPLAINT:   Chief Complaint  Patient presents with   Chest Pain    HISTORY OF PRESENT ILLNESS:  Brian Porter is a 58 y.o. Caucasian male with medical history significant for combined systolic and diastolic CHF, coronary artery disease, CVA, GERD, hypertension, and OSA, who presented to the emergency room with acute onset of left parasternal chest pain which has been intermittent over the last 3 days.  His chest pain felt like pressure and he graded 8/10 in severity.  It occurred while having dinner and he decided to come to the ER.  His episodes would last about 10 to 15 minutes and resolve after aspirin .  Today he took 3 baby aspirin  without significant relief.  No palpitations or nausea or vomiting or diaphoresis with his chest pain.  He denied any cough or wheezing normal axis.  He has been having worsening lower extremity edema without pain.  He denied any recent travels or surgeries.  He denied any orthopnea or paroxysmal extreme dyspnea.  No significant dyspnea on exertion.  No fever or chills.  He has not been taking any medications for atrial fibrillation.  ED Course: When he came to the ER, heart rate was 148 with respiratory to 23 and pulse oximetry 94% on room air and later 98% on 2 L of O2 by nasal cannula with temperature 98.  Labs revealed unremarkable CMP except for total bili 1.8.  BNP was 320.5.  High-sensitivity troponin I was 32 and later 36 and CBC showed leukocytosis of 12.6.  Coag profile was normal. EKG as reviewed by me : EKG showed atrial fibrillation with rapid ventricular sponsor 143 with right superior axis deviation. Imaging: Portable chest x-ray showed cardiomegaly with central pulmonary vascular  congestion.  The patient was given 5 mg of IV Lopressor  twice and 25 mg of p.o. Lopressor , 2 mg of IV morphine  sulfate and 40 mg of IV Lasix  and chewable baby aspirin ..  He was placed on IV heparin  with bolus and drip.  He will be admitted to a progressive unit bed for further evaluation and management. PAST MEDICAL HISTORY:   Past Medical History:  Diagnosis Date   Acute on chronic systolic heart failure (HCC) 08/27/2016   CAD (coronary artery disease)    CHF (congestive heart failure) (HCC)    CVA (cerebral vascular accident) (HCC) 10/21/2020   GERD (gastroesophageal reflux disease)    Hypertension    MI, old    NSTEMI (non-ST elevated myocardial infarction) (HCC) 02/14/2022   Sleep apnea    Tobacco abuse 11/02/2015    PAST SURGICAL HISTORY:   Past Surgical History:  Procedure Laterality Date   CARDIAC CATHETERIZATION Right 10/16/2015   Procedure: Left Heart Cath and Coronary Angiography;  Surgeon: Denyse DELENA Bathe, MD;  Location: [facility] INVASIVE CV LAB;  Service: Cardiovascular;  Laterality: Right;   CORONARY ANGIOPLASTY WITH STENT PLACEMENT  approx 2 years ago   HERNIA REPAIR Left 11/29/2016   [facility]   LEFT HEART CATH AND CORONARY ANGIOGRAPHY N/A 02/15/2022   Procedure: LEFT HEART CATH AND CORONARY ANGIOGRAPHY;  Surgeon: Ammon Blunt, MD;  Location: [facility] INVASIVE CV LAB;  Service: Cardiovascular;  Laterality: N/A;  12:00 PM   PARTIAL NEPHRECTOMY  Left    SPLENECTOMY      SOCIAL HISTORY:   Social History   Tobacco Use   Smoking status: Former    Current packs/day: 0.00    Types: Cigarettes    Quit date: 10/24/2020    Years since quitting: 3.3   Smokeless tobacco: Never  Substance Use Topics   Alcohol use: No    Alcohol/week: 0.0 standard drinks of alcohol    FAMILY HISTORY:   Family History  Problem Relation Age of Onset   Hypertension Mother    Other Mother        covid pneumonia   Congestive Heart Failure Mother    Stroke Father    Heart attack Father 60    Diabetes Father    Hypertension Father    Diabetes Mellitus II Brother    Alzheimer's disease Maternal Grandmother    Heart attack Maternal Grandfather    Cancer Paternal Grandmother    Stroke Paternal Grandfather     DRUG ALLERGIES:   Allergies  Allergen Reactions   Entresto  [Sacubitril -Valsartan ] Cough   Jardiance  [Empagliflozin ] Diarrhea    REVIEW OF SYSTEMS:   ROS As per history of present illness. All pertinent systems were reviewed above. Constitutional, HEENT, cardiovascular, respiratory, GI, GU, musculoskeletal, neuro, psychiatric, endocrine, integumentary and hematologic systems were reviewed and are otherwise negative/unremarkable except for positive findings mentioned above in the HPI.   MEDICATIONS AT HOME:   Prior to Admission medications   Medication Sig Start Date End Date Taking? Authorizing Provider  acetaminophen  (TYLENOL ) 650 MG CR tablet Take 650 mg by mouth every 8 (eight) hours as needed for pain.   Yes [provider]  albuterol  (VENTOLIN  HFA) 108 (90 Base) MCG/ACT inhaler Inhale 2 puffs into the lungs every 6 (six) hours as needed for wheezing or shortness of breath. 10/19/23  Yes Saunders Shona CROME, PA-C  apixaban  (ELIQUIS ) 5 MG TABS tablet Take 1 tablet (5 mg total) by mouth 2 (two) times daily. 07/16/23 02/17/24 Yes Laurence Locus, DO  aspirin  81 MG EC tablet Take 1 tablet (81 mg total) by mouth daily. 10/23/20  Yes Elgergawy, Brayton RAMAN, MD  chlorpheniramine-HYDROcodone  (TUSSIONEX) 10-8 MG/5ML Take 5 mLs by mouth every 12 (twelve) hours as needed for cough. 10/19/23  Yes Summers, Rhonda L, PA-C  cyclobenzaprine  (FLEXERIL ) 5 MG tablet Take 5 mg by mouth 3 (three) times daily as needed.   Yes [provider]  furosemide  (LASIX ) 40 MG tablet Take 1 tablet (40 mg total) by mouth daily. 05/09/23  Yes Hackney, Ellouise A, FNP  gabapentin  (NEURONTIN ) 100 MG capsule Take 1 capsule (100 mg total) by mouth 3 (three) times daily. 11/28/22  Yes Iloabachie, Chioma  E, NP  ibuprofen  (ADVIL ) 200 MG tablet Take 200 mg by mouth every 6 (six) hours as needed.   Yes [provider]  isosorbide  mononitrate (IMDUR ) 30 MG 24 hr tablet Take 30 mg by mouth daily.   Yes [provider]  metoprolol  succinate (TOPROL -XL) 25 MG 24 hr tablet Take 25 mg by mouth daily. 11/19/23  Yes [provider]  potassium chloride  (KLOR-CON ) 10 MEQ tablet Take 10 mEq by mouth daily. 02/09/24  Yes [provider]  spironolactone  (ALDACTONE ) 25 MG tablet Take 1 tablet (25 mg total) by mouth daily. 06/11/22  Yes Iloabachie, Chioma E, NP  atorvastatin  (LIPITOR ) 80 MG tablet Take 1 tablet (80 mg total) by mouth daily. Patient not taking: Reported on 07/15/2023 02/16/22   Wouk, Devaughn Sayres, MD  azithromycin  (ZITHROMAX )  250 MG tablet Take 2 tablets PO on day 1, then take 1 tablet PO daily for 4 more days Patient not taking: Reported on 02/17/2024 10/24/23   Dicky Anes, MD  ranolazine  (RANEXA ) 500 MG 12 hr tablet Take 1 tablet (500 mg total) by mouth 2 (two) times daily. Patient not taking: Reported on 07/15/2023 03/01/22   Caleen Burgess BROCKS, MD      VITAL SIGNS:  Blood pressure 96/70, pulse (!) 116, temperature 98 F (36.7 C), temperature source Oral, resp. rate 18, height 5' 10 (1.778 m), weight 113.4 kg, SpO2 98%.  PHYSICAL EXAMINATION:  Physical Exam  GENERAL:  58 y.o.-year-old Asian male patient lying in the bed with no acute distress.  EYES: Pupils equal, round, reactive to light and accommodation. No scleral icterus. Extraocular muscles intact.  HEENT: Head atraumatic, normocephalic. Oropharynx and nasopharynx clear.  NECK:  Supple, no jugular venous distention. No thyroid  enlargement, no tenderness.  LUNGS: Normal breath sounds bilaterally, no wheezing, rales,rhonchi or crepitation. No use of accessory muscles of respiration.  CARDIOVASCULAR: Irregularly irregular tachycardic rhythm, S1, S2 normal. No murmurs, rubs, or gallops.  ABDOMEN: Soft,  nondistended, nontender. Bowel sounds present. No organomegaly or mass.  EXTREMITIES: No pedal edema, cyanosis, or clubbing.  NEUROLOGIC: Cranial nerves II through XII are intact. Muscle strength 5/5 in all extremities. Sensation intact. Gait not checked.  PSYCHIATRIC: The patient is alert and oriented x 3.  Normal affect and good eye contact. SKIN: No obvious rash, lesion, or ulcer.   LABORATORY PANEL:   CBC Recent Labs  Lab 02/17/24 1833  WBC 12.6*  HGB 15.4  HCT 45.7  PLT 340   ------------------------------------------------------------------------------------------------------------------  Chemistries  Recent Labs  Lab 02/17/24 1833  NA 138  K 3.5  CL 104  CO2 25  GLUCOSE 106*  BUN 19  CREATININE 1.04  CALCIUM  9.0  AST 23  ALT 20  ALKPHOS 50  BILITOT 1.8*   ------------------------------------------------------------------------------------------------------------------  Cardiac Enzymes No results for input(s): TROPONINI in the last 168 hours. ------------------------------------------------------------------------------------------------------------------  RADIOLOGY:  DG Chest Portable 1 View Result Date: 02/17/2024 CLINICAL DATA:  Chest pain EXAM: PORTABLE CHEST 1 VIEW COMPARISON:  Chest x-ray 10/24/2023 FINDINGS: The heart is enlarged. There central pulmonary vascular congestion. There is no focal lung infiltrate, pleural effusion or pneumothorax. No acute fractures are seen. IMPRESSION: Cardiomegaly with central pulmonary vascular congestion. Electronically Signed   By: Greig Pique M.D.   On: 02/17/2024 19:04      IMPRESSION AND PLAN:  Assessment and Plan: * Atrial fibrillation with RVR (HCC) - The patient was admitted to a progressive unit bed. - Will continue Toprol -XL for now. - Will utilize as needed IV Lopressor  and Cardizem . - Will continue IV heparin  for now which can be converted to p.o. Eliquis  towards discharge. - Will follow serial  troponin. - Chest pain is likely secondary to his atrial fibrillation.  Acute on chronic combined systolic and diastolic CHF (congestive heart failure) (HCC) - We will obtain a 2D echo as the last one was in 2023 and revealed EF of 35 to 40% with grade 1 diastolic dysfunction and mild mitral valve regurgitation. - Cardiology consult will be obtained. - I notified Dr. Florencio about the patient. - Will place him on diuresis with IV Lasix  and continue Aldactone  as well as Toprol -XL.  Essential hypertension - Continue antihypertensive therapy.  Dyslipidemia - Will continue statin therapy.  Peripheral neuropathy - Will continue Neurontin .   DVT prophylaxis: IV heparin .- Advanced Care Planning:  Code  Status: full code. Family Communication:  The plan of care was discussed in details with the patient (and family). I answered all questions. The patient agreed to proceed with the above mentioned plan. Further management will depend upon hospital course. Disposition Plan: Back to previous home environment Consults called: Cardiology. All the records are reviewed and case discussed with ED provider.  Status is: Inpatient  At the time of the admission, it appears that the appropriate admission status for this patient is inpatient.  This is judged to be reasonable and necessary in order to provide the required intensity of service to ensure the patient's safety given the presenting symptoms, physical exam findings and initial radiographic and laboratory data in the context of comorbid conditions.  The patient requires inpatient status due to high intensity of service, high risk of further deterioration and high frequency of surveillance required.  I certify that at the time of admission, it is my clinical judgment that the patient will require inpatient hospital care extending more than 2 midnights.                            Dispo: The patient is from: Home              Anticipated d/c is to:  Home              Patient currently is not medically stable to d/c.              Difficult to place patient: No  Madison DELENA Peaches M.D on 02/17/2024 at 8:58 PM  [facility]   From 7 PM-7 AM, contact night-coverage [email or web address]  CC: Primary care physician; [facility]

## 2024-02-17 NOTE — ED Provider Notes (Signed)
 [facility] Provider Note    Event Date/Time   First MD Initiated Contact with Patient 02/17/24 1829     (approximate)   History   Chief Complaint Chest Pain   HPI  Brian Porter is a 58 y.o. male with past medical history of hypertension, hyperlipidemia, CAD, CHF, atrial fibrillation, and stroke who presents to the ED complaining of chest pain.  Patient reports that he has been dealing with intermittent pressure in his chest for the past 3 days, had another episode tonight while at dinner and so decided to seek care in the ED.  He states that these episodes have lasted for 10 to 15 minutes each time and typically have resolved when he has taken aspirin .  He took three 81 mg aspirin  prior to arrival without significant relief.  He denies any palpitations and has not had any fever, cough, or difficulty breathing.  He has not noticed any pain or swelling in his legs.  He reports that he is not currently taking any medication for his atrial fibrillation.     Physical Exam   Triage Vital Signs: ED Triage Vitals [02/17/24 1830]  Encounter Vitals Group     BP      Girls Systolic BP Percentile      Girls Diastolic BP Percentile      Boys Systolic BP Percentile      Boys Diastolic BP Percentile      Pulse      Resp      Temp      Temp src      SpO2      Weight 250 lb (113.4 kg)     Height 5' 10 (1.778 m)     Head Circumference      Peak Flow      Pain Score 5     Pain Loc      Pain Education      Exclude from Growth Chart     Most recent vital signs: Vitals:   02/17/24 1930 02/17/24 1943  BP: 90/68 103/74  Pulse: (!) 105 (!) 115  Resp: 17   Temp:    SpO2: 94%     Constitutional: Alert and oriented. Eyes: Conjunctivae are normal. Head: Atraumatic. Nose: No congestion/rhinnorhea. Mouth/Throat: Mucous membranes are moist.  Cardiovascular: Tachycardic, irregularly irregular rhythm. Grossly normal heart sounds.  2+ radial pulses  bilaterally. Respiratory: Normal respiratory effort.  No retractions. Lungs CTAB. Gastrointestinal: Soft and nontender. No distention. Musculoskeletal: No lower extremity tenderness nor edema.  Neurologic:  Normal speech and language. No gross focal neurologic deficits are appreciated.    ED Results / Procedures / Treatments   Labs (all labs ordered are listed, but only abnormal results are displayed) Labs Reviewed  COMPREHENSIVE METABOLIC PANEL WITH GFR - Abnormal; Notable for the following components:      Result Value   Glucose, Bld 106 (*)    Total Bilirubin 1.8 (*)    All other components within normal limits  CBC - Abnormal; Notable for the following components:   WBC 12.6 (*)    All other components within normal limits  TROPONIN I (HIGH SENSITIVITY) - Abnormal; Notable for the following components:   Troponin I (High Sensitivity) 32 (*)    All other components within normal limits  PROTIME-INR  PROTIME-INR  APTT  HEPARIN  LEVEL (UNFRACTIONATED)  CBC  TROPONIN I (HIGH SENSITIVITY)     EKG  ED ECG REPORT I, Carlin Palin, the attending physician, personally  viewed and interpreted this ECG.   Date: 02/17/2024  EKG Time: 18:26  Rate: 143  Rhythm: atrial fibrillation  Axis: RAD  Intervals:none  ST&T Change: None  RADIOLOGY Chest x-ray reviewed and interpreted by me with pulmonary edema, no focal infiltrate noted.  PROCEDURES:  Critical Care performed: Yes, see critical care procedure note(s)  .Critical Care  Performed by: Willo Dunnings, MD Authorized by: Willo Dunnings, MD   Critical care provider statement:    Critical care time (minutes):  30   Critical care time was exclusive of:  Separately billable procedures and treating other patients and teaching time   Critical care was necessary to treat or prevent imminent or life-threatening deterioration of the following conditions:  Respiratory failure   Critical care was time spent personally by me on  the following activities:  Development of treatment plan with patient or surrogate, discussions with consultants, evaluation of patient's response to treatment, examination of patient, ordering and review of laboratory studies, ordering and review of radiographic studies, ordering and performing treatments and interventions, pulse oximetry, re-evaluation of patient's condition and review of old charts   I assumed direction of critical care for this patient from another provider in my specialty: no     Care discussed with: admitting provider      MEDICATIONS ORDERED IN ED: Medications  heparin  bolus via infusion 5,900 Units (has no administration in time range)  heparin  ADULT infusion 100 units/mL (25000 units/250mL) (has no administration in time range)  metoprolol  tartrate (LOPRESSOR ) injection 5 mg (5 mg Intravenous Given 02/17/24 1844)  aspirin  chewable tablet 81 mg (81 mg Oral Given 02/17/24 1846)  metoprolol  tartrate (LOPRESSOR ) injection 5 mg (5 mg Intravenous Given 02/17/24 1919)  metoprolol  tartrate (LOPRESSOR ) tablet 25 mg (25 mg Oral Given 02/17/24 1943)  morphine  (PF) 2 MG/ML injection 2 mg (2 mg Intravenous Given 02/17/24 1946)     IMPRESSION / MDM / ASSESSMENT AND PLAN / ED COURSE  I reviewed the triage vital signs and the nursing notes.                              58 y.o. male with past medical history of hypertension, hyperlipidemia, CAD, CHF, atrial fibrillation on Eliquis , and stroke who presents to the ED complaining of intermittent chest pressure for the past 3 days.  Patient's presentation is most consistent with acute presentation with potential threat to life or bodily function.  Differential diagnosis includes, but is not limited to, ACS, PE, arrhythmia, dissection, pneumonia, pneumothorax, anemia, electrolyte abnormality, AKI.  Patient nontoxic-appearing and in no acute distress, vital signs remarkable for tachycardia and mild tachypnea.  EKG shows atrial fibrillation  with RVR, no ischemic changes noted.  We will give additional 81 mg aspirin  to complete loading dose, attempt to control heart rate with IV metoprolol .  Labs and chest x-ray are pending at this time.  Heart rate gradually improving following 2 doses of IV metoprolol , BP remains stable and we will give dose of oral metoprolol .  Patient was noted to be hypoxic on room air, improved on 2 L nasal cannula.  Chest x-ray concerning for pulmonary edema and we will diurese with IV Lasix .  Chest pain improving with improved heart rate, troponin mildly elevated and will trend.  We will also start patient on IV heparin  given atrial fibrillation and concern for possible ACS.  Labs without significant anemia, leukocytosis, electrolyte abnormality, or AKI.  LFTs are unremarkable, INR within  normal limits.  Case discussed with hospitalist for admission.      FINAL CLINICAL IMPRESSION(S) / ED DIAGNOSES   Final diagnoses:  Atrial fibrillation with RVR (HCC)  Acute respiratory failure with hypoxia (HCC)  Acute on chronic systolic congestive heart failure (HCC)  Nonspecific chest pain     Rx / DC Orders   ED Discharge Orders     None        Note:  This document was prepared using Dragon voice recognition software and may include unintentional dictation errors.   Willo Dunnings, MD 02/17/24 2011

## 2024-02-17 NOTE — Assessment & Plan Note (Signed)
 -  Continue antihypertensive therapy ?

## 2024-02-18 ENCOUNTER — Other Ambulatory Visit (HOSPITAL_COMMUNITY): Payer: Self-pay

## 2024-02-18 ENCOUNTER — Telehealth (HOSPITAL_COMMUNITY): Payer: Self-pay | Admitting: Pharmacy Technician

## 2024-02-18 ENCOUNTER — Inpatient Hospital Stay (HOSPITAL_COMMUNITY)
Admit: 2024-02-18 | Discharge: 2024-02-18 | Disposition: A | Discharge status: Home / Self Care | Payer: MEDICAID | Attending: Family Medicine | Admitting: Family Medicine

## 2024-02-18 DIAGNOSIS — I5021 Acute systolic (congestive) heart failure: Secondary | ICD-10-CM

## 2024-02-18 DIAGNOSIS — I5043 Acute on chronic combined systolic (congestive) and diastolic (congestive) heart failure: Secondary | ICD-10-CM | POA: Diagnosis not present

## 2024-02-18 DIAGNOSIS — I4891 Unspecified atrial fibrillation: Secondary | ICD-10-CM | POA: Diagnosis not present

## 2024-02-18 DIAGNOSIS — I10 Essential (primary) hypertension: Secondary | ICD-10-CM | POA: Diagnosis not present

## 2024-02-18 LAB — ECHOCARDIOGRAM COMPLETE
AR max vel: 3.99 cm2
AV Area VTI: 3.82 cm2
AV Area mean vel: 3.73 cm2
AV Mean grad: 4.3 mmHg
AV Peak grad: 8.3 mmHg
Ao pk vel: 1.44 m/s
Area-P 1/2: 5.13 cm2
Height: 70 in
MV VTI: 3.64 cm2
S' Lateral: 6.5 cm
Single Plane A4C EF: 32.5 %
Weight: 4000 [oz_av]

## 2024-02-18 LAB — BASIC METABOLIC PANEL WITH GFR
Anion gap: 9 (ref 5–15)
BUN: 22 mg/dL — ABNORMAL HIGH (ref 6–20)
CO2: 26 mmol/L (ref 22–32)
Calcium: 8.3 mg/dL — ABNORMAL LOW (ref 8.9–10.3)
Chloride: 104 mmol/L (ref 98–111)
Creatinine, Ser: 0.86 mg/dL (ref 0.61–1.24)
GFR, Estimated: >60 mL/min (ref >60)
Glucose, Bld: 129 mg/dL — ABNORMAL HIGH (ref 70–99)
Potassium: 3.8 mmol/L (ref 3.5–5.1)
Sodium: 139 mmol/L (ref 135–145)

## 2024-02-18 LAB — CBC
HCT: 42.5 % (ref 39.0–52.0)
Hemoglobin: 13.9 g/dL (ref 13.0–17.0)
MCH: 31.7 pg (ref 26.0–34.0)
MCHC: 32.7 g/dL (ref 30.0–36.0)
MCV: 96.8 fL (ref 80.0–100.0)
Platelets: 281 K/uL (ref 150–400)
RBC: 4.39 MIL/uL (ref 4.22–5.81)
RDW: 13.3 % (ref 11.5–15.5)
WBC: 9.5 K/uL (ref 4.0–10.5)
nRBC: 0 % (ref 0.0–0.2)

## 2024-02-18 LAB — HEPARIN LEVEL (UNFRACTIONATED)
Heparin Unfractionated: 0.27 [IU]/mL — ABNORMAL LOW (ref 0.30–0.70)
Heparin Unfractionated: 0.4 [IU]/mL (ref 0.30–0.70)

## 2024-02-18 MED ORDER — FUROSEMIDE 40 MG PO TABS: 60.0000 mg | ORAL_TABLET | Freq: Every day | ORAL | Status: DC

## 2024-02-18 MED ORDER — FUROSEMIDE 40 MG PO TABS
60.0000 mg | ORAL_TABLET | Freq: Every day | ORAL | Status: DC
  Administered 2024-02-18: 60 mg via ORAL
  Filled 2024-02-18: qty 2

## 2024-02-18 MED ORDER — APIXABAN 5 MG PO TABS: 5.0000 mg | ORAL_TABLET | Freq: Two times a day (BID) | ORAL | 0 refills | Status: DC

## 2024-02-18 MED ORDER — METOPROLOL SUCCINATE ER 50 MG PO TB24: 50.0000 mg | ORAL_TABLET | Freq: Every day | ORAL | Status: AC

## 2024-02-18 MED ORDER — HEPARIN BOLUS VIA INFUSION
1500.0000 [IU] | Freq: Once | INTRAVENOUS | Status: AC
  Administered 2024-02-18: 1500 [IU] via INTRAVENOUS
  Filled 2024-02-18: qty 1500

## 2024-02-18 MED ORDER — APIXABAN 5 MG PO TABS
5.0000 mg | ORAL_TABLET | Freq: Two times a day (BID) | ORAL | Status: DC
  Administered 2024-02-18: 5 mg via ORAL
  Filled 2024-02-18: qty 1

## 2024-02-18 NOTE — Discharge Summary (Signed)
 Physician Discharge Summary   Patient: Brian Porter MRN: [id number] DOB: 01-28-1966  Admit date:     02/17/2024  Discharge date: 02/18/24  Discharge Physician: Murvin Mana   PCP: [facility]   Recommendations at discharge:   Follow-up with PCP in 1 week. Follow-up with cardiology as  scheduled.  Discharge Diagnoses: Principal Problem:   Atrial fibrillation with RVR (HCC) Active Problems:   Acute on chronic combined systolic and diastolic CHF (congestive heart failure) (HCC)   Essential hypertension   Dyslipidemia   Peripheral neuropathy  Resolved Problems:   * No resolved hospital problems. *  Hospital Course: Brian Porter is a 58 y.o. Caucasian male with medical history significant for combined systolic and diastolic CHF, coronary artery disease, CVA, GERD, hypertension, and OSA, who presented to the emergency room with acute onset of left parasternal chest pain which has been intermittent over the last 3 days.   He has been having worsening lower extremity edema without pain.  Upon arrival, her heart rate was 148, EKG showed atrial fibrillation with RVR, BNP 320.  Chest x-ray showed cardiomegaly with pulm vascular congestion, BNP 320. Received IV Lasix , beta-blocker, has converted to sinus morning of 8/20. Condition has improved, discussed with cardiology, okay to discharge from their standpoint.  Assessment and Plan: Paroxysmal atrial fibrillation with RVR (HCC) Patient has converted to sinus rhythm.  Will continue increased dose of beta-blocker, continue Eliquis .  Patient will be followed by cardiology as outpatient.  Acute on chronic combined systolic and diastolic CHF (congestive heart failure) (HCC) Cardiogram showed ejection fraction of 30 to 35%, discussed with cardiology, this has been an improvement compared to the echocardiogram performed recently in the office. Lasix  was increased to 60 mg daily.  Continue Aldactone .  Patient be followed by cardiology in  the office to initiate GDMT.   Essential hypertension - Continue antihypertensive therapy.  Dyslipidemia - Will continue statin therapy.  Peripheral neuropathy - Will continue Neurontin .        Consultants: Cardiology Procedures performed: None  Disposition: Home Diet recommendation:  Discharge Diet Orders (From admission, onward)     Start     Ordered   02/18/24 0000  Diet - low sodium heart healthy        02/18/24 1238           Cardiac diet DISCHARGE MEDICATION: Allergies as of 02/18/2024       Reactions   Entresto  [sacubitril -valsartan ] Cough   Jardiance  [empagliflozin ] Diarrhea        Medication List     STOP taking these medications    azithromycin  250 MG tablet Commonly known as: Zithromax    ibuprofen  200 MG tablet Commonly known as: ADVIL    ranolazine  500 MG 12 hr tablet Commonly known as: RANEXA        TAKE these medications    acetaminophen  650 MG CR tablet Commonly known as: TYLENOL  Take 650 mg by mouth every 8 (eight) hours as needed for pain.   albuterol  108 (90 Base) MCG/ACT inhaler Commonly known as: VENTOLIN  HFA Inhale 2 puffs into the lungs every 6 (six) hours as needed for wheezing or shortness of breath.   apixaban  5 MG Tabs tablet Commonly known as: ELIQUIS  Take 1 tablet (5 mg total) by mouth 2 (two) times daily.   aspirin  EC 81 MG tablet Take 1 tablet (81 mg total) by mouth daily.   atorvastatin  80 MG tablet Commonly known as: LIPITOR  Take 1 tablet (80 mg total) by mouth daily.  chlorpheniramine-HYDROcodone  10-8 MG/5ML Commonly known as: TUSSIONEX Take 5 mLs by mouth every 12 (twelve) hours as needed for cough.   cyclobenzaprine  5 MG tablet Commonly known as: FLEXERIL  Take 5 mg by mouth 3 (three) times daily as needed.   furosemide  40 MG tablet Commonly known as: LASIX  Take 1.5 tablets (60 mg total) by mouth daily. What changed: how much to take   gabapentin  100 MG capsule Commonly known as:  NEURONTIN  Take 1 capsule (100 mg total) by mouth 3 (three) times daily.   isosorbide  mononitrate 30 MG 24 hr tablet Commonly known as: IMDUR  Take 30 mg by mouth daily.   metoprolol  succinate 50 MG 24 hr tablet Commonly known as: TOPROL -XL Take 1 tablet (50 mg total) by mouth daily. What changed: Another medication with the same name was removed. Continue taking this medication, and follow the directions you see here.   potassium chloride  10 MEQ tablet Commonly known as: KLOR-CON  Take 10 mEq by mouth daily.   spironolactone  25 MG tablet Commonly known as: ALDACTONE  Take 1 tablet (25 mg total) by mouth daily.        Follow-up Information     [place], Caralyn, PA-C. Go in 1 week(s).   Specialty: Cardiology Why: [facility] on 02/24/2024 at 11 AM Contact information: [address] [place] [address] [phone]         [facility] Follow up in 1 week(s).   Contact information: [address] [place] [address] [phone]                Discharge Exam: Brian Porter   02/17/24 1830  Weight: 113.4 kg   General exam: Appears calm and comfortable  Respiratory system: A few crackles in the bases. Respiratory effort normal. Cardiovascular system: S1 & S2 heard, RRR. No JVD, murmurs, rubs, gallops or clicks. Gastrointestinal system: Abdomen is nondistended, soft and nontender. No organomegaly or masses felt. Normal bowel sounds heard. Central nervous system: Alert and oriented. No focal neurological deficits. Extremities: Trace leg edema Skin: No rashes, lesions or ulcers Psychiatry: Judgement and insight appear normal. Mood & affect appropriate.    Condition at discharge: good  The results of significant diagnostics from this hospitalization (including imaging, microbiology, ancillary and laboratory) are listed below for reference.   Imaging Studies: ECHOCARDIOGRAM COMPLETE Result Date: 02/18/2024     ECHOCARDIOGRAM REPORT   Patient Name:   Brian Porter Date of Exam: 02/18/2024 Medical Rec #:  [id number]      Height:       70.0 in Accession #:    [id number]     Weight:       250.0 lb Date of Birth:  January 07, 1966       BSA:          2.295 m Patient Age:    58 years       BP:           99/78 mmHg Patient Gender: M              HR:           74 bpm. Exam Location:  [facility] Procedure: 2D Echo, Cardiac Doppler, 3D Echo, Color Doppler and Strain Analysis            (Both Spectral and Color Flow Doppler were utilized during            procedure). Indications:     CHF-acute systolic I50.21  History:  Patient has prior history of Echocardiogram examinations, most                  recent 02/15/2022. CHF, Previous Myocardial Infarction; Risk                  Factors:Hypertension. Tobacco abuse.  Sonographer:     Christopher Furnace Referring Phys:  [id number] JAN A MANSY Diagnosing Phys: Evalene Lunger MD  Sonographer Comments: Global longitudinal strain was attempted. IMPRESSIONS  1. Left ventricular ejection fraction, by estimation, is 30 to 35% (appears unchanged compared to prior studies). Left ventricular ejection fraction by PLAX is 30 %. The left ventricle has moderately decreased function. The left ventricle demonstrates global hypokinesis. The left ventricular internal cavity size was moderately dilated. The average left ventricular global longitudinal strain is -4.6 %.  2. Right ventricular systolic function is normal. The right ventricular size is normal.  3. Left atrial size was moderately dilated.  4. The mitral valve is normal in structure. Moderate mitral valve regurgitation. No evidence of mitral stenosis.  5. The aortic valve is tricuspid. There is mild calcification of the aortic valve. Aortic valve regurgitation is not visualized. Aortic valve sclerosis/calcification is present, without any evidence of aortic stenosis.  6. There is borderline dilatation of the aortic root, measuring 39 mm.  7. The inferior vena cava  is normal in size with greater than 50% respiratory variability, suggesting right atrial pressure of 3 mmHg. FINDINGS  Left Ventricle: Left ventricular ejection fraction, by estimation, is 30 to 35%. Left ventricular ejection fraction by PLAX is 30 %. The left ventricle has moderately decreased function. The left ventricle demonstrates global hypokinesis. The average left ventricular global longitudinal strain is -4.6 %. Strain was performed and the global longitudinal strain is indeterminate. The left ventricular internal cavity size was moderately dilated. There is no left ventricular hypertrophy. Left ventricular diastolic parameters are indeterminate. Right Ventricle: The right ventricular size is normal. No increase in right ventricular wall thickness. Right ventricular systolic function is normal. Left Atrium: Left atrial size was moderately dilated. Right Atrium: Right atrial size was normal in size. Pericardium: There is no evidence of pericardial effusion. Mitral Valve: The mitral valve is normal in structure. There is mild calcification of the mitral valve leaflet(s). Moderate mitral valve regurgitation. No evidence of mitral valve stenosis. MV peak gradient, 6.0 mmHg. The mean mitral valve gradient is 3.0 mmHg. Tricuspid Valve: The tricuspid valve is normal in structure. Tricuspid valve regurgitation is not demonstrated. No evidence of tricuspid stenosis. Aortic Valve: The aortic valve is tricuspid. There is mild calcification of the aortic valve. Aortic valve regurgitation is not visualized. Aortic valve sclerosis/calcification is present, without any evidence of aortic stenosis. Aortic valve mean gradient measures 4.3 mmHg. Aortic valve peak gradient measures 8.3 mmHg. Aortic valve area, by VTI measures 3.82 cm. Pulmonic Valve: The pulmonic valve was normal in structure. Pulmonic valve regurgitation is not visualized. No evidence of pulmonic stenosis. Aorta: The aortic root is normal in size and  structure. There is borderline dilatation of the aortic root, measuring 39 mm. Venous: The inferior vena cava is normal in size with greater than 50% respiratory variability, suggesting right atrial pressure of 3 mmHg. IAS/Shunts: No atrial level shunt detected by color flow Doppler. Additional Comments: 3D was performed not requiring image post processing on an independent workstation and was indeterminate.  LEFT VENTRICLE PLAX 2D LV EF:         Left  Diastology                ventricular     LV e' medial:    6.74 cm/s                ejection        LV E/e' medial:  17.2                fraction by     LV e' lateral:   4.90 cm/s                PLAX is 30      LV E/e' lateral: 23.7                %. LVIDd:         7.60 cm         2D Longitudinal LVIDs:         6.50 cm         Strain LV PW:         1.50 cm         2D Strain GLS   -4.6 % LV IVS:        1.10 cm         Avg: LVOT diam:     3.15 cm LV SV:         109 LV SV Index:   48 LVOT Area:     7.79 cm  LV Volumes (MOD) LV vol d, MOD    231.0 ml A4C: LV vol s, MOD    156.0 ml A4C: LV SV MOD A4C:   231.0 ml RIGHT VENTRICLE RV Basal diam:  4.00 cm RV Mid diam:    3.00 cm LEFT ATRIUM             Index        RIGHT ATRIUM           Index LA diam:        5.00 cm 2.18 cm/m   RA Area:     12.50 cm LA Vol (A2C):   43.4 ml 18.91 ml/m  RA Volume:   30.90 ml  13.46 ml/m LA Vol (A4C):   60.9 ml 26.54 ml/m LA Biplane Vol: 55.6 ml 24.23 ml/m  AORTIC VALVE AV Area (Vmax):    3.99 cm AV Area (Vmean):   3.73 cm AV Area (VTI):     3.82 cm AV Vmax:           144.33 cm/s AV Vmean:          96.233 cm/s AV VTI:            0.285 m AV Peak Grad:      8.3 mmHg AV Mean Grad:      4.3 mmHg LVOT Vmax:         73.90 cm/s LVOT Vmean:        46.100 cm/s LVOT VTI:          0.140 m LVOT/AV VTI ratio: 0.49  AORTA Ao Root diam: 3.90 cm MITRAL VALVE                TRICUSPID VALVE MV Area (PHT): 5.13 cm     TR Peak grad:   4.0 mmHg MV Area VTI:   3.64 cm     TR Vmax:        99.50  cm/s MV Peak grad:  6.0 mmHg MV Mean grad:  3.0 mmHg  SHUNTS MV Vmax:       1.22 m/s     Systemic VTI:  0.14 m MV Vmean:      79.3 cm/s    Systemic Diam: 3.15 cm MV Decel Time: 148 msec MV E velocity: 116.00 cm/s MV A velocity: 56.10 cm/s MV E/A ratio:  2.07 Evalene Lunger MD Electronically signed by Evalene Lunger MD Signature Date/Time: 02/18/2024/11:32:25 AM    Final    DG Chest Portable 1 View Result Date: 02/17/2024 CLINICAL DATA:  Chest pain EXAM: PORTABLE CHEST 1 VIEW COMPARISON:  Chest x-ray 10/24/2023 FINDINGS: The heart is enlarged. There central pulmonary vascular congestion. There is no focal lung infiltrate, pleural effusion or pneumothorax. No acute fractures are seen. IMPRESSION: Cardiomegaly with central pulmonary vascular congestion. Electronically Signed   By: Greig Pique M.D.   On: 02/17/2024 19:04    Microbiology: Results for orders placed or performed during the hospital encounter of 10/19/23  Group A Strep by PCR     Status: None   Collection Time: 10/19/23  6:40 AM   Specimen: Throat; Sterile Swab  Result Value Ref Range Status   Group A Strep by PCR NOT DETECTED NOT DETECTED Final    Comment: Performed at [facility], [address] [place], [place] [address]  Resp panel by RT-PCR (RSV, Flu A&B, Covid) Throat     Status: None   Collection Time: 10/19/23  6:40 AM   Specimen: Throat; Nasal Swab  Result Value Ref Range Status   SARS Coronavirus 2 by RT PCR NEGATIVE NEGATIVE Final    Comment: (NOTE) SARS-CoV-2 target nucleic acids are NOT DETECTED.  The SARS-CoV-2 RNA is generally detectable in upper respiratory specimens during the acute phase of infection. The lowest concentration of SARS-CoV-2 viral copies this assay can detect is 138 copies/mL. A negative result does not preclude SARS-Cov-2 infection and should not be used as the sole basis for treatment or other patient management decisions. A negative result may occur with  improper specimen  collection/handling, submission of specimen other than nasopharyngeal swab, presence of viral mutation(s) within the areas targeted by this assay, and inadequate number of viral copies(<138 copies/mL). A negative result must be combined with clinical observations, patient history, and epidemiological information. The expected result is Negative.  Fact Sheet for Patients:  BloggerCourse.com  Fact Sheet for Healthcare Providers:  SeriousBroker.it  This test is no t yet approved or cleared by the [place]  FDA and  has been authorized for detection and/or diagnosis of SARS-CoV-2 by FDA under an Emergency Use Authorization (EUA). This EUA will remain  in effect (meaning this test can be used) for the duration of the COVID-19 declaration under Section 564(b)(1) of the Act, 21 U.S.C.section 360bbb-3(b)(1), unless the authorization is terminated  or revoked sooner.       Influenza A by PCR NEGATIVE NEGATIVE Final   Influenza B by PCR NEGATIVE NEGATIVE Final    Comment: (NOTE) The Xpert Xpress SARS-CoV-2/FLU/RSV plus assay is intended as an aid in the diagnosis of influenza from Nasopharyngeal swab specimens and should not be used as a sole basis for treatment. Nasal washings and aspirates are unacceptable for Xpert Xpress SARS-CoV-2/FLU/RSV testing.  Fact Sheet for Patients: BloggerCourse.com  Fact Sheet for Healthcare Providers: SeriousBroker.it  This test is not yet approved or cleared by the [place]  FDA and has been authorized for detection and/or diagnosis of SARS-CoV-2 by FDA under an Emergency Use Authorization (EUA). This EUA will remain in effect (meaning this test  can be used) for the duration of the COVID-19 declaration under Section 564(b)(1) of the Act, 21 U.S.C. section 360bbb-3(b)(1), unless the authorization is terminated or revoked.     Resp Syncytial  Virus by PCR NEGATIVE NEGATIVE Final    Comment: (NOTE) Fact Sheet for Patients: BloggerCourse.com  Fact Sheet for Healthcare Providers: SeriousBroker.it  This test is not yet approved or cleared by the [place]  FDA and has been authorized for detection and/or diagnosis of SARS-CoV-2 by FDA under an Emergency Use Authorization (EUA). This EUA will remain in effect (meaning this test can be used) for the duration of the COVID-19 declaration under Section 564(b)(1) of the Act, 21 U.S.C. section 360bbb-3(b)(1), unless the authorization is terminated or revoked.  Performed at [facility], [address] [place], [place] [address]     Labs: CBC: Recent Labs  Lab 02/17/24 1833 02/18/24 0243  WBC 12.6* 9.5  HGB 15.4 13.9  HCT 45.7 42.5  MCV 93.6 96.8  PLT 340 281   Basic Metabolic Panel: Recent Labs  Lab 02/17/24 1833 02/18/24 0243  NA 138 139  K 3.5 3.8  CL 104 104  CO2 25 26  GLUCOSE 106* 129*  BUN 19 22*  CREATININE 1.04 0.86  CALCIUM  9.0 8.3*   Liver Function Tests: Recent Labs  Lab 02/17/24 1833  AST 23  ALT 20  ALKPHOS 50  BILITOT 1.8*  PROT 7.9  ALBUMIN 4.1   CBG: No results for input(s): GLUCAP in the last 168 hours.  Discharge time spent: 34 minutes.  Signed: Murvin Mana, MD [facility] 02/18/2024

## 2024-02-18 NOTE — Hospital Course (Signed)
 Brian Porter is a 58 y.o. Caucasian male with medical history significant for combined systolic and diastolic CHF, coronary artery disease, CVA, GERD, hypertension, and OSA, who presented to the emergency room with acute onset of left parasternal chest pain which has been intermittent over the last 3 days.   He has been having worsening lower extremity edema without pain.  Upon arrival, her heart rate was 148, EKG showed atrial fibrillation with RVR, BNP 320.  Chest x-ray showed cardiomegaly with pulm vascular congestion, BNP 320. Received IV Lasix , beta-blocker, has converted to sinus morning of 8/20.

## 2024-02-18 NOTE — Progress Notes (Signed)
*  PRELIMINARY RESULTS* Echocardiogram 2D Echocardiogram has been performed.  Brian Porter 02/18/2024, 8:27 AM

## 2024-02-18 NOTE — ED Notes (Signed)
 Pt placed on hospital bed. No other comfort measures requested at this time.

## 2024-02-18 NOTE — TOC CM/SW Note (Signed)
..  Transition of Care [facility]) - Inpatient Brief Assessment   Patient Details  Name: Brian Porter MRN: [id number] Date of Birth: 02/26/1966  Transition of Care [facility] CM/SW Contact:    Edsel DELENA Fischer, LCSW Phone Number: 02/18/2024, 8:36 AM   Clinical Narrative:   SW to handoff to heart failure team to follow up with pt.  If additional needs are present, sw to address  Transition of Care Asessment:

## 2024-02-18 NOTE — Consult Note (Signed)
 [facility] CARDIOLOGY CONSULT NOTE       Patient ID: Brian Porter MRN: [id number] DOB/AGE: 03/13/1966 58 y.o.  Admit date: 02/17/2024 Referring Physician Dr. Murvin Mana Primary Physician [facility]  Primary Cardiologist Dr. Dewane Reason for Consultation A-fib RVR, AoCHF  HPI: Brian Porter is a 58 y.o. male  with a past medical history of coronary artery disease s/p PCI to LAD in 2014, chronic HFrEF, dilated cardiomyopathy, hypertension, hyperlipidemia who presented to the ED on 02/17/2024 for chest tightness, shortness of breath. Cardiology was consulted for further evaluation.   Had onset of chest discomfort and shortness of breath yesterday, persistent symptoms that he decided to come to the ED for further evaluation.  Workup in the ED notable for creatinine 1.04, potassium 3.5, hemoglobin 15.4, WBC 12.6.  Troponins 32 > 36, BNP 320. EKG in the ED atrial fibrillation RVR rate 143 bpm.  Initially treated with IV metoprolol  in the ED, converted back to normal sinus rhythm.  Also started on IV diuretics.  At the time my evaluation this morning patient is sitting upright on side of hospital bed, reports he is feeling much better and back to his baseline.  We discussed his symptoms in further detail.  States that he had onset of chest discomfort yesterday which resolved with conversion back to normal sinus rhythm, was in A-fib RVR which likely contributed to symptoms.  Also reported shortness of breath and lower extremity edema.  Was given IV Lasix  and has had good urine output.  States that he feels that his lower extremity edema and breathing is back to his baseline.  Review of systems complete and found to be negative unless listed above    Past Medical History:  Diagnosis Date   Acute on chronic systolic heart failure (HCC) 08/27/2016   CAD (coronary artery disease)    CHF (congestive heart failure) (HCC)    CVA (cerebral vascular accident) (HCC) 10/21/2020   GERD  (gastroesophageal reflux disease)    Hypertension    MI, old    NSTEMI (non-ST elevated myocardial infarction) (HCC) 02/14/2022   Sleep apnea    Tobacco abuse 11/02/2015    Past Surgical History:  Procedure Laterality Date   CARDIAC CATHETERIZATION Right 10/16/2015   Procedure: Left Heart Cath and Coronary Angiography;  Surgeon: Denyse DELENA Bathe, MD;  Location: [facility] INVASIVE CV LAB;  Service: Cardiovascular;  Laterality: Right;   CORONARY ANGIOPLASTY WITH STENT PLACEMENT  approx 2 years ago   HERNIA REPAIR Left 11/29/2016   [facility]   LEFT HEART CATH AND CORONARY ANGIOGRAPHY N/A 02/15/2022   Procedure: LEFT HEART CATH AND CORONARY ANGIOGRAPHY;  Surgeon: Ammon Blunt, MD;  Location: [facility] INVASIVE CV LAB;  Service: Cardiovascular;  Laterality: N/A;  12:00 PM   PARTIAL NEPHRECTOMY Left    SPLENECTOMY      (Not in a hospital admission)  Social History   Socioeconomic History   Marital status: Single    Spouse name: Not on file   Number of children: Not on file   Years of education: Not on file   Highest education level: Not on file  Occupational History   Occupation: unemployed  Tobacco Use   Smoking status: Former    Current packs/day: 0.00    Types: Cigarettes    Quit date: 10/24/2020    Years since quitting: 3.3   Smokeless tobacco: Never  Vaping Use   Vaping status: Never Used  Substance and Sexual Activity   Alcohol use: No  Alcohol/week: 0.0 standard drinks of alcohol   Drug use: Not Currently    Comment: percocet   Sexual activity: Not Currently  Other Topics Concern   Not on file  Social History Narrative   Not on file   Social Drivers of Health   Financial Resource Strain: Low Risk  (09/12/2022)   Overall Financial Resource Strain (CARDIA)    Difficulty of Paying Living Expenses: Not very hard  Food Insecurity: No Food Insecurity (09/12/2022)   Hunger Vital Sign    Worried About Running Out of Food in the Last Year: Never true    Ran Out of Food in the  Last Year: Never true  Transportation Needs: No Transportation Needs (09/12/2022)   PRAPARE - Administrator, Civil Service (Medical): No    Lack of Transportation (Non-Medical): No  Physical Activity: Sufficiently Active (09/12/2022)   Exercise Vital Sign    Days of Exercise per Week: 7 days    Minutes of Exercise per Session: 30 min  Stress: No Stress Concern Present (09/12/2022)   [employer] of Occupational Health - Occupational Stress Questionnaire    Feeling of Stress : Not at all  Social Connections: Moderately Integrated (09/12/2022)   Social Connection and Isolation Panel    Frequency of Communication with Friends and Family: More than three times a week    Frequency of Social Gatherings with Friends and Family: Once a week    Attends Religious Services: More than 4 times per year    Active Member of [employer] or Organizations: No    Attends Banker Meetings: Never    Marital Status: Living with partner  Intimate Partner Violence: Not At Risk (09/12/2022)   Humiliation, Afraid, Rape, and Kick questionnaire    Fear of Current or Ex-Partner: No    Emotionally Abused: No    Physically Abused: No    Sexually Abused: No    Family History  Problem Relation Age of Onset   Hypertension Mother    Other Mother        covid pneumonia   Congestive Heart Failure Mother    Stroke Father    Heart attack Father 66   Diabetes Father    Hypertension Father    Diabetes Mellitus II Brother    Alzheimer's disease Maternal Grandmother    Heart attack Maternal Grandfather    Cancer Paternal Grandmother    Stroke Paternal Grandfather      Vitals:   02/18/24 0605 02/18/24 0711 02/18/24 0730 02/18/24 0956  BP: 99/78   (!) 119/91  Pulse: 74  70 75  Resp: 14  20 17   Temp:  98 F (36.7 C)    TempSrc:      SpO2: 94%  96% 98%  Weight:      Height:        PHYSICAL EXAM General: Chronically ill-appearing male, well nourished, in no acute distress. HEENT:  Normocephalic and atraumatic. Neck: No JVD.  Lungs: Normal respiratory effort on room air. Clear bilaterally to auscultation. No wheezes, crackles, rhonchi.  Heart: HRRR. Normal S1 and S2 without gallops or murmurs.  Abdomen: Non-distended appearing.  Msk: Normal strength and tone for age. Extremities: Warm and well perfused. No clubbing, cyanosis.  Trace edema.  Neuro: Alert and oriented X 3. Psych: Answers questions appropriately.   Labs: Basic Metabolic Panel: Recent Labs    02/17/24 1833 02/18/24 0243  NA 138 139  K 3.5 3.8  CL 104 104  CO2 25 26  GLUCOSE 106* 129*  BUN 19 22*  CREATININE 1.04 0.86  CALCIUM  9.0 8.3*   Liver Function Tests: Recent Labs    02/17/24 1833  AST 23  ALT 20  ALKPHOS 50  BILITOT 1.8*  PROT 7.9  ALBUMIN 4.1   No results for input(s): LIPASE, AMYLASE in the last 72 hours. CBC: Recent Labs    02/17/24 1833 02/18/24 0243  WBC 12.6* 9.5  HGB 15.4 13.9  HCT 45.7 42.5  MCV 93.6 96.8  PLT 340 281   Cardiac Enzymes: Recent Labs    02/17/24 1833 02/17/24 2012  TROPONINIHS 32* 36*   BNP: Recent Labs    02/17/24 1853  BNP 320.5*   D-Dimer: No results for input(s): DDIMER in the last 72 hours. Hemoglobin A1C: No results for input(s): HGBA1C in the last 72 hours. Fasting Lipid Panel: No results for input(s): CHOL, HDL, LDLCALC, TRIG, CHOLHDL, LDLDIRECT in the last 72 hours. Thyroid  Function Tests: No results for input(s): TSH, T4TOTAL, T3FREE, THYROIDAB in the last 72 hours.  Invalid input(s): FREET3 Anemia Panel: No results for input(s): VITAMINB12, FOLATE, FERRITIN, TIBC, IRON, RETICCTPCT in the last 72 hours.   Radiology: DG Chest Portable 1 View Result Date: 02/17/2024 CLINICAL DATA:  Chest pain EXAM: PORTABLE CHEST 1 VIEW COMPARISON:  Chest x-ray 10/24/2023 FINDINGS: The heart is enlarged. There central pulmonary vascular congestion. There is no focal lung infiltrate, pleural  effusion or pneumothorax. No acute fractures are seen. IMPRESSION: Cardiomegaly with central pulmonary vascular congestion. Electronically Signed   By: Greig Pique M.D.   On: 02/17/2024 19:04    ECHO pending review  TELEMETRY reviewed by me 02/18/2024: Sinus rhythm rate 70s atrial  EKG reviewed by me: Atrial fibrillation RVR rate 143 bpm  Data reviewed by me 02/18/2024: last 24h vitals tele labs imaging I/O ED provider note, admission H&P  Principal Problem:   Atrial fibrillation with RVR (HCC) Active Problems:   Dyslipidemia   Essential hypertension   Peripheral neuropathy   Acute on chronic combined systolic and diastolic CHF (congestive heart failure) (HCC)    ASSESSMENT AND PLAN:  Brian Porter is a 58 y.o. male  with a past medical history of coronary artery disease s/p PCI to LAD in 2014, chronic HFrEF, dilated cardiomyopathy, hypertension, hyperlipidemia who presented to the ED on 02/17/2024 for chest tightness, shortness of breath. Cardiology was consulted for further evaluation.   # Atrial fibrillation RVR # Paroxysmal atrial fibrillation # Acute on chronic HFrEF Patient initially presented with complaints of chest discomfort likely secondary to atrial fibrillation RVR.  Converted back to normal sinus rhythm with IV metoprolol .  BNP was elevated at 320.  Was given IV Lasix  with good response. - Will increase home p.o. Lasix  to 60 mg daily. - Agree with increase in metoprolol  succinate to 50 mg daily. - Transition heparin  to Eliquis  5 mg twice daily.  (Co-pay $4 per month) - Continue spironolactone  25 mg daily.  Would initiate ARB but patient reports that he did not tolerate this in the past.  Will consider SGLT2 inhibitor at outpatient follow-up.  # Coronary artery disease s/p DES to LAD 2014 Patient with hx of CAD and underwent stenting in 2014, has had reduced EF for many years. -Continue atorvastatin  80 mg daily and aspirin  81 mg daily.  -Continue metoprolol  succinate  as above, imdur  30 mg daily.  - Minimally elevated and flat trending troponin most consistent with demand/supply mismatch and not ACS   Follow-up appointment scheduled for [facility] cardiology  heart failure clinic on 02/24/2024 at 11 AM.  This patient's plan of care was discussed and created with Dr. Florencio and he is in agreement.  Signed: Danita Bloch, PA-C  02/18/2024, 10:25 AM [facility] Cardiology

## 2024-02-18 NOTE — Consult Note (Signed)
 Pharmacy Consult Note - Anticoagulation  Pharmacy Consult for heparin  Indication: atrial fibrillation  PATIENT MEASUREMENTS: Height: 5' 10 (177.8 cm) Weight: 113.4 kg (250 lb) IBW/kg (Calculated) : 73 HEPARIN  DW (KG): 97.9  VITAL SIGNS: Temp: 97.9 F (36.6 C) (08/20 0255) Temp Source: Oral (08/20 0255) BP: 96/75 (08/20 0255) Pulse Rate: 72 (08/20 0255)  Recent Labs    02/17/24 1843 02/17/24 2012 02/18/24 0243  HGB  --   --  13.9  HCT  --   --  42.5  PLT  --   --  281  APTT 27  --   --   LABPROT 13.7  --   --   INR 1.0  --   --   HEPARINUNFRC  --   --  0.27*  CREATININE  --   --  0.86  TROPONINIHS  --  36*  --     Estimated Creatinine Clearance: 116.7 mL/min (by C-G formula based on SCr of 0.86 mg/dL).  PAST MEDICAL HISTORY: Past Medical History:  Diagnosis Date   Acute on chronic systolic heart failure (HCC) 08/27/2016   CAD (coronary artery disease)    CHF (congestive heart failure) (HCC)    CVA (cerebral vascular accident) (HCC) 10/21/2020   GERD (gastroesophageal reflux disease)    Hypertension    MI, old    NSTEMI (non-ST elevated myocardial infarction) (HCC) 02/14/2022   Sleep apnea    Tobacco abuse 11/02/2015    ASSESSMENT: 58 y.o. male with PMH including HTN, prediabetes, hx CVA, PAD is presenting with new onset atrial fibrillation. HR currently 105-130 and ECG shows Afib with RVR. CHA2DS2VASc is 5 (CHF, HTN, CVA+2, CAD/NSTEMI) and patient is not on chronic anticoagulation per chart review. Pharmacy has been consulted to initiate and manage heparin  intravenous infusion.  Pertinent medications: No chronic anticoag PTA per chart review  Goal(s) of therapy: Heparin  level 0.3 - 0.7 units/mL Monitor platelets by anticoagulation protocol: Yes   Baseline anticoagulation labs: Recent Labs    02/17/24 1833 02/17/24 1843 02/18/24 0243  APTT  --  27  --   INR  --  1.0  --   HGB 15.4  --  13.9  PLT 340  --  281    8/20 0243 HL 0.27,  subtherapeutic     PLAN: Give 1500 units bolus x 1 Increase heparin  infusion to 1650 units/hour. Recheck heparin  level in 6 hours after rate change. Monitor CBC daily while on heparin  infusion.  Rankin CANDIE Dills, PharmD, MBA 02/18/2024 3:12 AM

## 2024-02-18 NOTE — Telephone Encounter (Signed)
 Patient Product/process development scientist completed.    The patient is insured through [employer] [place].     Ran test claim for Eliquis 5 mg and the current 30 day co-pay is $4.00.   This test claim was processed through [facility]- copay amounts may vary at other pharmacies due to pharmacy/plan contracts, or as the patient moves through the different stages of their insurance plan.     Roland Earl, CPHT Pharmacy Technician III Certified Patient Advocate [facility] Patient Advocate Team Direct Number: [phone]  Fax: [phone]

## 2024-02-24 ENCOUNTER — Telehealth: Payer: Self-pay | Admitting: Family

## 2024-02-24 NOTE — Telephone Encounter (Signed)
 Called to confirm/remind patient of their appointment at the [facility] on 02/25/24.   Appointment:   [] Confirmed  [x] Left mess   [] No answer/No voice mail  [] VM Full/unable to leave message  [] Phone not in service  Patient reminded to bring all medications and/or complete list.  Confirmed patient has transportation. Gave directions, instructed to utilize valet parking.

## 2024-02-25 ENCOUNTER — Encounter: Payer: MEDICAID | Admitting: Family

## 2024-02-27 ENCOUNTER — Encounter: Payer: MEDICAID | Admitting: Family

## 2024-03-14 ENCOUNTER — Observation Stay
Admission: EM | Admit: 2024-03-14 | Discharge: 2024-03-15 | Disposition: A | Discharge status: Home / Self Care | Payer: MEDICAID | Attending: Student in an Organized Health Care Education/Training Program | Admitting: Student in an Organized Health Care Education/Training Program

## 2024-03-14 ENCOUNTER — Other Ambulatory Visit: Payer: Self-pay

## 2024-03-14 ENCOUNTER — Emergency Department: Payer: MEDICAID

## 2024-03-14 DIAGNOSIS — I110 Hypertensive heart disease with heart failure: Secondary | ICD-10-CM | POA: Diagnosis not present

## 2024-03-14 DIAGNOSIS — I4891 Unspecified atrial fibrillation: Principal | ICD-10-CM | POA: Diagnosis present

## 2024-03-14 DIAGNOSIS — Z87891 Personal history of nicotine dependence: Secondary | ICD-10-CM | POA: Insufficient documentation

## 2024-03-14 DIAGNOSIS — I5042 Chronic combined systolic (congestive) and diastolic (congestive) heart failure: Secondary | ICD-10-CM | POA: Diagnosis not present

## 2024-03-14 DIAGNOSIS — G629 Polyneuropathy, unspecified: Secondary | ICD-10-CM | POA: Insufficient documentation

## 2024-03-14 DIAGNOSIS — H6123 Impacted cerumen, bilateral: Secondary | ICD-10-CM | POA: Insufficient documentation

## 2024-03-14 DIAGNOSIS — Z7982 Long term (current) use of aspirin: Secondary | ICD-10-CM | POA: Diagnosis not present

## 2024-03-14 DIAGNOSIS — R079 Chest pain, unspecified: Secondary | ICD-10-CM

## 2024-03-14 DIAGNOSIS — E785 Hyperlipidemia, unspecified: Secondary | ICD-10-CM | POA: Diagnosis not present

## 2024-03-14 DIAGNOSIS — I480 Paroxysmal atrial fibrillation: Secondary | ICD-10-CM | POA: Diagnosis not present

## 2024-03-14 DIAGNOSIS — Z79899 Other long term (current) drug therapy: Secondary | ICD-10-CM | POA: Diagnosis not present

## 2024-03-14 DIAGNOSIS — R42 Dizziness and giddiness: Secondary | ICD-10-CM | POA: Diagnosis present

## 2024-03-14 DIAGNOSIS — I10 Essential (primary) hypertension: Secondary | ICD-10-CM | POA: Diagnosis present

## 2024-03-14 LAB — CBC
HCT: 46.5 % (ref 39.0–52.0)
Hemoglobin: 15.7 g/dL (ref 13.0–17.0)
MCH: 31.5 pg (ref 26.0–34.0)
MCHC: 33.8 g/dL (ref 30.0–36.0)
MCV: 93.2 fL (ref 80.0–100.0)
Platelets: 348 K/uL (ref 150–400)
RBC: 4.99 MIL/uL (ref 4.22–5.81)
RDW: 13.2 % (ref 11.5–15.5)
WBC: 10.9 K/uL — ABNORMAL HIGH (ref 4.0–10.5)
nRBC: 0 % (ref 0.0–0.2)

## 2024-03-14 LAB — BASIC METABOLIC PANEL WITH GFR
Anion gap: 10 (ref 5–15)
BUN: 16 mg/dL (ref 6–20)
CO2: 24 mmol/L (ref 22–32)
Calcium: 8.9 mg/dL (ref 8.9–10.3)
Chloride: 104 mmol/L (ref 98–111)
Creatinine, Ser: 1.15 mg/dL (ref 0.61–1.24)
GFR, Estimated: >60 mL/min (ref >60)
Glucose, Bld: 161 mg/dL — ABNORMAL HIGH (ref 70–99)
Potassium: 3.7 mmol/L (ref 3.5–5.1)
Sodium: 138 mmol/L (ref 135–145)

## 2024-03-14 LAB — TROPONIN I (HIGH SENSITIVITY)
Troponin I (High Sensitivity): 29 ng/L — ABNORMAL HIGH
Troponin I (High Sensitivity): 28 ng/L — ABNORMAL HIGH (ref <18)
Troponin I (High Sensitivity): 31 ng/L — ABNORMAL HIGH (ref <18)

## 2024-03-14 MED ORDER — ASPIRIN 81 MG PO TBEC
81.0000 mg | DELAYED_RELEASE_TABLET | Freq: Every day | ORAL | Status: DC
  Administered 2024-03-15: 81 mg via ORAL
  Filled 2024-03-14: qty 1

## 2024-03-14 MED ORDER — SODIUM CHLORIDE 0.9 % IV SOLN: INTRAVENOUS | Status: DC

## 2024-03-14 MED ORDER — ALBUTEROL SULFATE (2.5 MG/3ML) 0.083% IN NEBU: 3.0000 mL | INHALATION_SOLUTION | Freq: Four times a day (QID) | RESPIRATORY_TRACT | Status: DC | PRN

## 2024-03-14 MED ORDER — TRAZODONE HCL 50 MG PO TABS
25.0000 mg | ORAL_TABLET | Freq: Every evening | ORAL | Status: DC | PRN
  Administered 2024-03-15: 25 mg via ORAL
  Filled 2024-03-14: qty 1

## 2024-03-14 MED ORDER — ASPIRIN 81 MG PO CHEW
324.0000 mg | CHEWABLE_TABLET | Freq: Once | ORAL | Status: AC
  Administered 2024-03-14: 324 mg via ORAL
  Filled 2024-03-14: qty 4

## 2024-03-14 MED ORDER — FUROSEMIDE 40 MG PO TABS
60.0000 mg | ORAL_TABLET | Freq: Every day | ORAL | Status: DC
  Administered 2024-03-15: 60 mg via ORAL
  Filled 2024-03-14: qty 2

## 2024-03-14 MED ORDER — SODIUM CHLORIDE 0.9 % IV BOLUS
1000.0000 mL | Freq: Once | INTRAVENOUS | Status: AC
  Administered 2024-03-14: 1000 mL via INTRAVENOUS

## 2024-03-14 MED ORDER — APIXABAN 5 MG PO TABS
5.0000 mg | ORAL_TABLET | Freq: Two times a day (BID) | ORAL | Status: DC
  Administered 2024-03-14 – 2024-03-15 (×2): 5 mg via ORAL
  Filled 2024-03-14 (×2): qty 1

## 2024-03-14 MED ORDER — NITROGLYCERIN 0.4 MG SL SUBL: 0.4000 mg | SUBLINGUAL_TABLET | SUBLINGUAL | Status: DC | PRN

## 2024-03-14 MED ORDER — GABAPENTIN 100 MG PO CAPS
100.0000 mg | ORAL_CAPSULE | Freq: Three times a day (TID) | ORAL | Status: DC
  Administered 2024-03-14 – 2024-03-15 (×2): 100 mg via ORAL
  Filled 2024-03-14 (×2): qty 1

## 2024-03-14 MED ORDER — METOPROLOL SUCCINATE ER 50 MG PO TB24
50.0000 mg | ORAL_TABLET | Freq: Every day | ORAL | Status: DC
  Administered 2024-03-15: 50 mg via ORAL
  Filled 2024-03-14: qty 1

## 2024-03-14 MED ORDER — MORPHINE SULFATE (PF) 2 MG/ML IV SOLN
2.0000 mg | INTRAVENOUS | Status: DC | PRN
  Administered 2024-03-14 – 2024-03-15 (×3): 2 mg via INTRAVENOUS
  Filled 2024-03-14 (×3): qty 1

## 2024-03-14 MED ORDER — DILTIAZEM HCL-DEXTROSE 125-5 MG/125ML-% IV SOLN (PREMIX)
5.0000 mg/h | INTRAVENOUS | Status: DC
  Administered 2024-03-14: 5 mg/h via INTRAVENOUS
  Administered 2024-03-15: 12.5 mg/h via INTRAVENOUS
  Filled 2024-03-14 (×2): qty 125

## 2024-03-14 MED ORDER — ISOSORBIDE MONONITRATE ER 60 MG PO TB24
30.0000 mg | ORAL_TABLET | Freq: Every day | ORAL | Status: DC
  Administered 2024-03-14 – 2024-03-15 (×2): 30 mg via ORAL
  Filled 2024-03-14 (×2): qty 1

## 2024-03-14 MED ORDER — POTASSIUM CHLORIDE CRYS ER 10 MEQ PO TBCR
10.0000 meq | EXTENDED_RELEASE_TABLET | Freq: Every day | ORAL | Status: DC
  Administered 2024-03-15: 10 meq via ORAL
  Filled 2024-03-14: qty 1

## 2024-03-14 MED ORDER — ROSUVASTATIN CALCIUM 10 MG PO TABS
10.0000 mg | ORAL_TABLET | Freq: Every day | ORAL | Status: DC
  Administered 2024-03-14: 10 mg via ORAL
  Filled 2024-03-14: qty 1

## 2024-03-14 MED ORDER — ACETAMINOPHEN 325 MG RE SUPP: 650.0000 mg | Freq: Four times a day (QID) | RECTAL | Status: DC | PRN

## 2024-03-14 MED ORDER — ONDANSETRON HCL 4 MG/2ML IJ SOLN: 4.0000 mg | Freq: Four times a day (QID) | INTRAMUSCULAR | Status: DC | PRN

## 2024-03-14 MED ORDER — DILTIAZEM HCL 25 MG/5ML IV SOLN
15.0000 mg | Freq: Once | INTRAVENOUS | Status: AC
  Administered 2024-03-14: 15 mg via INTRAVENOUS
  Filled 2024-03-14: qty 5

## 2024-03-14 MED ORDER — SPIRONOLACTONE 25 MG PO TABS
25.0000 mg | ORAL_TABLET | Freq: Every day | ORAL | Status: DC
  Administered 2024-03-15: 25 mg via ORAL
  Filled 2024-03-14: qty 1

## 2024-03-14 MED ORDER — MAGNESIUM HYDROXIDE 400 MG/5ML PO SUSP: 30.0000 mL | Freq: Every day | ORAL | Status: DC | PRN

## 2024-03-14 MED ORDER — ACETAMINOPHEN 325 MG PO TABS: 650.0000 mg | ORAL_TABLET | Freq: Four times a day (QID) | ORAL | Status: DC | PRN

## 2024-03-14 MED ORDER — ONDANSETRON HCL 4 MG PO TABS: 4.0000 mg | ORAL_TABLET | Freq: Four times a day (QID) | ORAL | Status: DC | PRN

## 2024-03-14 MED ORDER — FENTANYL CITRATE PF 50 MCG/ML IJ SOSY
50.0000 ug | PREFILLED_SYRINGE | Freq: Once | INTRAMUSCULAR | Status: AC
  Administered 2024-03-14: 50 ug via INTRAVENOUS
  Filled 2024-03-14: qty 1

## 2024-03-14 MED ORDER — CYCLOBENZAPRINE HCL 10 MG PO TABS
5.0000 mg | ORAL_TABLET | Freq: Three times a day (TID) | ORAL | Status: DC | PRN
Start: 2024-03-14 — End: 2024-03-15
  Administered 2024-03-14 – 2024-03-15 (×3): 5 mg via ORAL
  Filled 2024-03-14 (×3): qty 1

## 2024-03-14 NOTE — ED Triage Notes (Signed)
 Pt to ED for dizziness, chest pain for a few days. Reports worsening cp today. Hx afib

## 2024-03-14 NOTE — Assessment & Plan Note (Signed)
 Will continue statin therapy

## 2024-03-14 NOTE — ED Notes (Addendum)
 Patient stating he is still having pressure in his chest, reported to EDP.

## 2024-03-14 NOTE — Assessment & Plan Note (Signed)
-   Will continue Neurontin .

## 2024-03-14 NOTE — Assessment & Plan Note (Signed)
-   Will continue antihypertensive therapy.

## 2024-03-14 NOTE — Assessment & Plan Note (Addendum)
-   The patient will be admitted to an observation progressive unit bed. - Will continue IV Cardizem  drip for now. - Will continue Toprol -XL for now. - Will continue course. - Will follow serial troponin. - Chest pain is likely secondary to his atrial fibrillation. - Cardiology consult will be obtained. - Dr. Florencio was notified about the patient.

## 2024-03-14 NOTE — Assessment & Plan Note (Signed)
-   Will continue Lasix  and Imdur , Toprol -XL as well as Aldactone .

## 2024-03-14 NOTE — ED Provider Notes (Signed)
 [facility] Provider Note    Event Date/Time   First MD Initiated Contact with Patient 03/14/24 1504     (approximate)   History   Dizziness and Chest Pain   HPI  Brian Porter is a 58 y.o. male  who presents to the emergency department today because of concern for chest pain and afib. The patient tells me that the symptoms started today around 9 or 10 in the morning when he was power washing.  The pain is located in the center chest.  He then felt like his A-fib was going out of control.  He says that he only took 25 mg of his metoprolol  the past 2 days because his blood pressure was low.  Patient denies any shortness of breath.  Denies any radiation of the pain.     Physical Exam   Triage Vital Signs: ED Triage Vitals  Encounter Vitals Group     BP 03/14/24 1459 123/74     Girls Systolic BP Percentile --      Girls Diastolic BP Percentile --      Boys Systolic BP Percentile --      Boys Diastolic BP Percentile --      Pulse Rate 03/14/24 1501 (!) 150     Resp 03/14/24 1459 18     Temp 03/14/24 1459 98 F (36.7 C)     Temp src --      SpO2 03/14/24 1459 94 %     Weight 03/14/24 1459 238 lb (108 kg)     Height 03/14/24 1459 5' 9 (1.753 m)     Head Circumference --      Peak Flow --      Pain Score 03/14/24 1459 6     Pain Loc --      Pain Education --      Exclude from Growth Chart --     Most recent vital signs: Vitals:   03/14/24 1459 03/14/24 1501  BP: 123/74   Pulse:  (!) 150  Resp: 18   Temp: 98 F (36.7 C)   SpO2: 94%     General: Awake, alert, oriented. CV:  Good peripheral perfusion. Irregular rate and rhythm. Resp:  Normal effort. Lungs clear. Abd:  No distention.  Other:  No lower extremity edema.    ED Results / Procedures / Treatments   Labs (all labs ordered are listed, but only abnormal results are displayed) Labs Reviewed  BASIC METABOLIC PANEL WITH GFR - Abnormal; Notable for the following components:       Result Value   Glucose, Bld 161 (*)    All other components within normal limits  CBC - Abnormal; Notable for the following components:   WBC 10.9 (*)    All other components within normal limits  TROPONIN I (HIGH SENSITIVITY) - Abnormal; Notable for the following components:   Troponin I (High Sensitivity) 29 (*)    All other components within normal limits  TROPONIN I (HIGH SENSITIVITY) - Abnormal; Notable for the following components:   Troponin I (High Sensitivity) 28 (*)    All other components within normal limits     EKG I, Guadalupe Eagles, attending physician, personally viewed and interpreted this EKG  EKG Time: 1502 Rate: 144 Rhythm: afib with rvr Axis: left axis deviation Intervals: qtc 436 QRS: narrow, q waves v1 ST changes: no st elevation Impression: abnormal ekg  RADIOLOGY I independently interpreted and visualized the CXR. My interpretation: No pneumonia Radiology  interpretation:  IMPRESSION:  1. Cardiomegaly.  2. Pulmonary interstitial prominence without definite pulmonary edema.     PROCEDURES:  Critical Care performed: Yes  CRITICAL CARE Performed by: Guadalupe Eagles   Total critical care time: 30 minutes  Critical care time was exclusive of separately billable procedures and treating other patients.  Critical care was necessary to treat or prevent imminent or life-threatening deterioration.  Critical care was time spent personally by me on the following activities: development of treatment plan with patient and/or surrogate as well as nursing, discussions with consultants, evaluation of patient's response to treatment, examination of patient, obtaining history from patient or surrogate, ordering and performing treatments and interventions, ordering and review of laboratory studies, ordering and review of radiographic studies, pulse oximetry and re-evaluation of patient's condition.   Procedures    MEDICATIONS ORDERED IN ED: Medications -  No data to display   IMPRESSION / MDM / ASSESSMENT AND PLAN / ED COURSE  I reviewed the triage vital signs and the nursing notes.                              Differential diagnosis includes, but is not limited to, atrial fibrillation, pneumonia, ACS, anxiety  Patient's presentation is most consistent with acute presentation with potential threat to life or bodily function.   The patient is on the cardiac monitor to evaluate for evidence of arrhythmia and/or significant heart rate changes.  Patient presented to the emergency department today because of concerns for chest pain and atrial fibrillation.  On exam patient is in A-fib with RVR.  Lungs are clear.  No lower extremity edema.  EKG without any ST elevation.  Initial troponin was slightly elevated however patient has history of elevated troponins.  Will plan on repeat.  Patient was given an IV diltiazem  bolus which did help lower his heart rate however he continued to maintain heart rates in the 120s to 130s.  Will plan on diltiazem  infusion.   Patient's heart rate did improve once on diltiazem  infusion.  Dr. Lawence with the hospitalist service will evaluate for admission.   FINAL CLINICAL IMPRESSION(S) / ED DIAGNOSES   Final diagnoses:  Atrial fibrillation with RVR (HCC)  Chest pain, unspecified type     Note:  This document was prepared using Dragon voice recognition software and may include unintentional dictation errors.    Eagles Guadalupe, MD 03/14/24 [phone]

## 2024-03-14 NOTE — H&P (Addendum)
 [place]   PATIENT NAME: Brian Porter    MR#:  [id number]  DATE OF BIRTH:  1966-06-19  DATE OF ADMISSION:  03/14/2024  PRIMARY CARE PHYSICIAN: [facility]   Patient is coming from: Home  REQUESTING/REFERRING PHYSICIAN: Floy Roberts, MD  CHIEF COMPLAINT:   Chief Complaint  Patient presents with   Dizziness   Chest Pain    HISTORY OF PRESENT ILLNESS:  Brian Porter is a 58 y.o. Caucasian male with medical history significant for  combined systolic and diastolic CHF, coronary artery disease, CVA, GERD, hypertension, and OSA, who presented to the emergency room with acute onset of l midsternal chest pain which has been intermittent today.  Felt like pressure and was 6-7/10 in severity with associated dyspnea and lightheadedness without nausea or vomiting or diaphoresis.  He denied any palpitations.  Denied any worsening lower extremity edema orthopnea or paroxysmal nocturnal dyspnea.  No dysuria, oliguria or hematuria or flank pain.  He had a similar presentation when he was admitted a few weeks ago.  ED Course: Upon presentation to the ER, BP was 120/95 with heart rate of 106 and later 113 then 129 with otherwise normal vital signs.  Labs revealed a blood glucose of 161 with otherwise unremarkable BMP.  High sensitive troponin I was 29 and later 28 better than previous levels.  CBC showed WBCs of 10.9 and was otherwise normal. EKG as reviewed by me : Initial EKG showed atrial fibrillation with RVR of 144 with left axis deviation and minimal voltage criteria for LVH. Imaging: 2 view chest x-ray showed cardiomegaly pulmonary interstitial prominence without definite pulmonary edema.  His most recent 2D echo on 02/18/2024 revealed an EF of 30 to 35% and moderately dilated left ventricular internal cavity and left atrium with moderate mitral regurgitation.  The patient was given 15 mg of IV Cardizem  bolus followed by drip, 50 mcg of IV fentanyl , 1 L of IV normal saline  and 4 baby aspirin .  He will be admitted to an observation progressive unit bed for further evaluation and management. PAST MEDICAL HISTORY:   Past Medical History:  Diagnosis Date   Acute on chronic systolic heart failure (HCC) 08/27/2016   CAD (coronary artery disease)    CHF (congestive heart failure) (HCC)    CVA (cerebral vascular accident) (HCC) 10/21/2020   GERD (gastroesophageal reflux disease)    Hypertension    MI, old    NSTEMI (non-ST elevated myocardial infarction) (HCC) 02/14/2022   Sleep apnea    Tobacco abuse 11/02/2015    PAST SURGICAL HISTORY:   Past Surgical History:  Procedure Laterality Date   CARDIAC CATHETERIZATION Right 10/16/2015   Procedure: Left Heart Cath and Coronary Angiography;  Surgeon: Denyse DELENA Bathe, MD;  Location: [facility] INVASIVE CV LAB;  Service: Cardiovascular;  Laterality: Right;   CORONARY ANGIOPLASTY WITH STENT PLACEMENT  approx 2 years ago   HERNIA REPAIR Left 11/29/2016   [facility]   LEFT HEART CATH AND CORONARY ANGIOGRAPHY N/A 02/15/2022   Procedure: LEFT HEART CATH AND CORONARY ANGIOGRAPHY;  Surgeon: Ammon Blunt, MD;  Location: [facility] INVASIVE CV LAB;  Service: Cardiovascular;  Laterality: N/A;  12:00 PM   PARTIAL NEPHRECTOMY Left    SPLENECTOMY      SOCIAL HISTORY:   Social History   Tobacco Use   Smoking status: Former    Current packs/day: 0.00    Types: Cigarettes    Quit date: 10/24/2020    Years since quitting: 3.3  Smokeless tobacco: Never  Substance Use Topics   Alcohol use: No    Alcohol/week: 0.0 standard drinks of alcohol    FAMILY HISTORY:   Family History  Problem Relation Age of Onset   Hypertension Mother    Other Mother        covid pneumonia   Congestive Heart Failure Mother    Stroke Father    Heart attack Father 61   Diabetes Father    Hypertension Father    Diabetes Mellitus II Brother    Alzheimer's disease Maternal Grandmother    Heart attack Maternal Grandfather    Cancer Paternal  Grandmother    Stroke Paternal Grandfather     DRUG ALLERGIES:   Allergies  Allergen Reactions   Entresto  [Sacubitril -Valsartan ] Cough   Jardiance  [Empagliflozin ] Diarrhea    REVIEW OF SYSTEMS:   ROS As per history of present illness. All pertinent systems were reviewed above. Constitutional, HEENT, cardiovascular, respiratory, GI, GU, musculoskeletal, neuro, psychiatric, endocrine, integumentary and hematologic systems were reviewed and are otherwise negative/unremarkable except for positive findings mentioned above in the HPI.   MEDICATIONS AT HOME:   Prior to Admission medications   Medication Sig Start Date End Date Taking? Authorizing Provider  acetaminophen  (TYLENOL ) 650 MG CR tablet Take 650 mg by mouth every 8 (eight) hours as needed for pain.   Yes [provider]  albuterol  (VENTOLIN  HFA) 108 (90 Base) MCG/ACT inhaler Inhale 2 puffs into the lungs every 6 (six) hours as needed for wheezing or shortness of breath. 10/19/23  Yes Saunders Givens L, PA-C  apixaban  (ELIQUIS ) 5 MG TABS tablet Take 1 tablet (5 mg total) by mouth 2 (two) times daily. 02/18/24 05/18/24 Yes Laurita Pillion, MD  aspirin  81 MG EC tablet Take 1 tablet (81 mg total) by mouth daily. 10/23/20  Yes Elgergawy, Brayton RAMAN, MD  cyclobenzaprine  (FLEXERIL ) 5 MG tablet Take 5 mg by mouth 3 (three) times daily as needed.   Yes [provider]  furosemide  (LASIX ) 40 MG tablet Take 1.5 tablets (60 mg total) by mouth daily. 02/18/24  Yes Laurita Pillion, MD  gabapentin  (NEURONTIN ) 100 MG capsule Take 1 capsule (100 mg total) by mouth 3 (three) times daily. 11/28/22  Yes Iloabachie, Chioma E, NP  isosorbide  mononitrate (IMDUR ) 30 MG 24 hr tablet Take 30 mg by mouth daily.   Yes [provider]  metoprolol  succinate (TOPROL -XL) 50 MG 24 hr tablet Take 1 tablet (50 mg total) by mouth daily. 02/18/24 05/18/24 Yes Laurita Pillion, MD  potassium chloride  (KLOR-CON ) 10 MEQ tablet Take 10 mEq by mouth daily. 02/09/24   Yes [provider]  rosuvastatin  (CRESTOR ) 10 MG tablet Take 10 mg by mouth daily. Pt dr instructed to start taking 1/2 tablet tomorrow 9/15 02/24/24 02/23/25 Yes [provider]  spironolactone  (ALDACTONE ) 25 MG tablet Take 1 tablet (25 mg total) by mouth daily. 06/11/22  Yes Iloabachie, Chioma E, NP  atorvastatin  (LIPITOR ) 80 MG tablet Take 1 tablet (80 mg total) by mouth daily. Patient not taking: Reported on 03/14/2024 02/16/22   Wouk, Devaughn Sayres, MD  chlorpheniramine-HYDROcodone  (TUSSIONEX) 10-8 MG/5ML Take 5 mLs by mouth every 12 (twelve) hours as needed for cough. Patient not taking: Reported on 03/14/2024 10/19/23   Saunders Givens CROME, PA-C      VITAL SIGNS:  Blood pressure 130/87, pulse 77, temperature 98.2 F (36.8 C), temperature source Oral, resp. rate 16, height 5' 9 (1.753 m), weight 108 kg, SpO2 96%.  PHYSICAL EXAMINATION:  Physical Exam  GENERAL:  58 y.o.-year-old patient lying in the bed with no acute distress.  EYES: Pupils equal, round, reactive to light and accommodation. No scleral icterus. Extraocular muscles intact.  HEENT: Head atraumatic, normocephalic. Oropharynx and nasopharynx clear.  NECK:  Supple, no jugular venous distention. No thyroid  enlargement, no tenderness.  LUNGS: Normal breath sounds bilaterally, no wheezing, rales,rhonchi or crepitation. No use of accessory muscles of respiration.  CARDIOVASCULAR: Irregularly irregular tachycardic rhythm, S1, S2 normal. No murmurs, rubs, or gallops.  ABDOMEN: Soft, nondistended, nontender. Bowel sounds present. No organomegaly or mass.  EXTREMITIES: Trace bilateral lower extremity pitting edema, with no cyanosis, or clubbing.  NEUROLOGIC: Cranial nerves II through XII are intact. Muscle strength 5/5 in all extremities. Sensation intact. Gait not checked.  PSYCHIATRIC: The patient is alert and oriented x 3.  Normal affect and good eye contact. SKIN: No obvious rash, lesion, or ulcer.   LABORATORY  PANEL:   CBC Recent Labs  Lab 03/14/24 1522  WBC 10.9*  HGB 15.7  HCT 46.5  PLT 348   ------------------------------------------------------------------------------------------------------------------  Chemistries  Recent Labs  Lab 03/14/24 1522  NA 138  K 3.7  CL 104  CO2 24  GLUCOSE 161*  BUN 16  CREATININE 1.15  CALCIUM  8.9   ------------------------------------------------------------------------------------------------------------------  Cardiac Enzymes No results for input(s): TROPONINI in the last 168 hours. ------------------------------------------------------------------------------------------------------------------  RADIOLOGY:  DG Chest 2 View Result Date: 03/14/2024 EXAM: 2 VIEW(S) XRAY OF THE CHEST 03/14/2024 03:14:00 PM COMPARISON: 02/17/2024 CLINICAL HISTORY: Dizziness, chest pain x a few days. Reports worsening chest pain today. History of atrial fibrillation, coronary artery disease, congestive heart failure. FINDINGS: LUNGS AND PLEURA: Pulmonary interstitial prominence without definite pulmonary edema. No focal pulmonary opacity. No pleural effusion. No pneumothorax. HEART AND MEDIASTINUM: Cardiomegaly. No acute abnormality of the cardiac and mediastinal silhouettes. BONES AND SOFT TISSUES: No acute osseous abnormality. IMPRESSION: 1. Cardiomegaly. 2. Pulmonary interstitial prominence without definite pulmonary edema. Electronically signed by: Lonni Necessary MD 03/14/2024 04:04 PM EDT RP Workstation: [id number]      IMPRESSION AND PLAN:  Assessment and Plan: * Atrial fibrillation with RVR (HCC) - The patient will be admitted to an observation progressive unit bed. - Will continue IV Cardizem  drip for now. - Will continue Toprol -XL for now. - Will continue course. - Will follow serial troponin. - Chest pain is likely secondary to his atrial fibrillation. - Cardiology consult will be obtained. - Dr. Florencio was notified about the  patient.  Essential hypertension - Will continue antihypertensive therapy.  Dyslipidemia - Will continue statin therapy.  Chronic combined systolic and diastolic CHF (congestive heart failure) (HCC) - Will continue Lasix  and Imdur , Toprol -XL as well as Aldactone .  Peripheral neuropathy - Will continue Neurontin .   DVT prophylaxis: Eliquis . Advanced Care Planning:  Code Status: full code. Family Communication:  The plan of care was discussed in details with the patient (and family). I answered all questions. The patient agreed to proceed with the above mentioned plan. Further management will depend upon hospital course. Disposition Plan: Back to previous home environment Consults called: Cardiology All the records are reviewed and case discussed with ED provider.  Status is: Observation  I certify that at the time of admission, it is my clinical judgment that the patient will require hospital care extending less than 2 midnights.                            Dispo: The patient is from: Home  Anticipated d/c is to: Home              Patient currently is not medically stable to d/c.              Difficult to place patient: No  [place]ison DELENA Peaches M.D on 03/14/2024 at 7:29 PM  [facility]   From 7 PM-7 AM, contact night-coverage [email or web address]  CC: Primary care physician; [facility]

## 2024-03-14 NOTE — ED Notes (Signed)
 Pt in X ray

## 2024-03-15 DIAGNOSIS — I4891 Unspecified atrial fibrillation: Secondary | ICD-10-CM | POA: Diagnosis not present

## 2024-03-15 DIAGNOSIS — H6123 Impacted cerumen, bilateral: Secondary | ICD-10-CM | POA: Diagnosis not present

## 2024-03-15 DIAGNOSIS — R079 Chest pain, unspecified: Secondary | ICD-10-CM | POA: Diagnosis not present

## 2024-03-15 LAB — BASIC METABOLIC PANEL WITH GFR
Anion gap: 6 (ref 5–15)
BUN: 19 mg/dL (ref 6–20)
CO2: 26 mmol/L (ref 22–32)
Calcium: 8.1 mg/dL — ABNORMAL LOW (ref 8.9–10.3)
Chloride: 108 mmol/L (ref 98–111)
Creatinine, Ser: 0.96 mg/dL (ref 0.61–1.24)
GFR, Estimated: >60 mL/min (ref >60)
Glucose, Bld: 119 mg/dL — ABNORMAL HIGH (ref 70–99)
Potassium: 4 mmol/L (ref 3.5–5.1)
Sodium: 140 mmol/L (ref 135–145)

## 2024-03-15 LAB — CBC
HCT: 42.1 % (ref 39.0–52.0)
Hemoglobin: 13.8 g/dL (ref 13.0–17.0)
MCH: 31.7 pg (ref 26.0–34.0)
MCHC: 32.8 g/dL (ref 30.0–36.0)
MCV: 96.6 fL (ref 80.0–100.0)
Platelets: 306 K/uL (ref 150–400)
RBC: 4.36 MIL/uL (ref 4.22–5.81)
RDW: 13.2 % (ref 11.5–15.5)
WBC: 9.9 K/uL (ref 4.0–10.5)
nRBC: 0 % (ref 0.0–0.2)

## 2024-03-15 LAB — TROPONIN I (HIGH SENSITIVITY): Troponin I (High Sensitivity): 30 ng/L — ABNORMAL HIGH (ref <18)

## 2024-03-15 MED ORDER — CARBAMIDE PEROXIDE 6.5 % OT SOLN
5.0000 [drp] | Freq: Two times a day (BID) | OTIC | Status: DC
  Administered 2024-03-15: 5 [drp] via OTIC
  Filled 2024-03-15: qty 15

## 2024-03-15 MED ORDER — AMIODARONE HCL 200 MG PO TABS: ORAL_TABLET | ORAL | 0 refills | Status: DC

## 2024-03-15 MED ORDER — AMIODARONE LOAD VIA INFUSION
150.0000 mg | Freq: Once | INTRAVENOUS | Status: DC
  Filled 2024-03-15: qty 83.34

## 2024-03-15 MED ORDER — AMIODARONE HCL IN DEXTROSE 360-4.14 MG/200ML-% IV SOLN: 60.0000 mg/h | INTRAVENOUS | Status: DC

## 2024-03-15 MED ORDER — AMIODARONE HCL 200 MG PO TABS: 200.0000 mg | ORAL_TABLET | Freq: Every day | ORAL | Status: DC

## 2024-03-15 MED ORDER — AMIODARONE HCL 200 MG PO TABS
400.0000 mg | ORAL_TABLET | Freq: Two times a day (BID) | ORAL | Status: DC
  Administered 2024-03-15: 400 mg via ORAL
  Filled 2024-03-15: qty 2

## 2024-03-15 MED ORDER — CARBAMIDE PEROXIDE 6.5 % OT SOLN: 5.0000 [drp] | Freq: Two times a day (BID) | OTIC | 0 refills | Status: AC

## 2024-03-15 MED ORDER — AMIODARONE HCL IN DEXTROSE 360-4.14 MG/200ML-% IV SOLN: 30.0000 mg/h | INTRAVENOUS | Status: DC

## 2024-03-15 MED ORDER — TRAZODONE HCL 50 MG PO TABS: 50.0000 mg | ORAL_TABLET | Freq: Every day | ORAL | 0 refills | Status: AC

## 2024-03-15 NOTE — Discharge Instructions (Signed)
 You have new prescriptions for ear wax, atrial fibrillation, and anxiety.  Please take them as directed and follow up with your PCP in 1-2 weeks to follow up on your ears and anxiety.  Please see cardiology in about 2 weeks to recheck your heart condition  You will go home with a cardiac monitor which will also be reviewed on follow up

## 2024-03-15 NOTE — Discharge Summary (Signed)
 Physician Discharge Summary  Patient: [facility] FMW:[id number] DOB: 1966-05-13   Code Status: Full Code Admit date: 03/14/2024 Discharge date: 03/15/2024 Disposition: Home, No home health services recommended PCP: [facility]  Recommendations for Outpatient Follow-up:  Follow up with PCP within 1-2 weeks Regarding general hospital follow up and preventative care Recommend consider ear irrigation as he had bilateral cerumen impaction unable to flush in ED. Given debrox drops.  Review anxiety treatments Follow up with cardiology   Discharge Diagnoses:  Principal Problem:   Atrial fibrillation with RVR (HCC) Active Problems:   Essential hypertension   Dyslipidemia   Peripheral neuropathy   Paroxysmal atrial fibrillation with RVR (HCC)   Chronic combined systolic and diastolic CHF (congestive heart failure) (HCC)   Bilateral impacted cerumen   Chest pain  Brief Hospital Course Summary: Brian Porter is a 58 y.o. Caucasian male with medical history significant for  combined systolic and diastolic CHF, coronary artery disease, CVA, GERD, hypertension, and OSA, who presented to the emergency room with acute onset of l midsternal chest pain.  ED Course: BP was 120/95 with heart rate of 106 and later 113 then 129 with otherwise normal vital signs.  Labs revealed a blood glucose of 161 with otherwise unremarkable BMP.  High sensitive troponin I was 29 and later 28 better than previous levels.  CBC showed WBCs of 10.9 and was otherwise normal. EKG: atrial fibrillation with RVR of 144 with left axis deviation and minimal voltage criteria for LVH. Imaging: 2 view chest x-ray showed cardiomegaly pulmonary interstitial prominence without definite pulmonary edema.   His most recent 2D echo on 02/18/2024 revealed an EF of 30 to 35% and moderately dilated left ventricular internal cavity and left atrium with moderate mitral regurgitation.   The patient was given 15 mg of IV Cardizem   bolus followed by drip, 50 mcg of IV fentanyl , 1 L of IV normal saline and 4 baby aspirin .    Cardiology managed NSTEMI with PO amiodarone  and metoprolol . He was also given an event monitor to wear at dc for 7 days.  He was continued on his home eliquis .  On day of dc he was well controlled and asymptomatic.   All other chronic conditions were treated with home medications.    Discharge Condition: Good, improved Recommended discharge diet: Regular healthy diet  Consultations: Cardiology   Procedures/Studies: None   Allergies as of 03/15/2024       Reactions   Entresto  [sacubitril -valsartan ] Cough   Jardiance  [empagliflozin ] Diarrhea        Medication List     STOP taking these medications    atorvastatin  80 MG tablet Commonly known as: LIPITOR    chlorpheniramine-HYDROcodone  10-8 MG/5ML Commonly known as: TUSSIONEX       TAKE these medications    acetaminophen  650 MG CR tablet Commonly known as: TYLENOL  Take 650 mg by mouth every 8 (eight) hours as needed for pain.   albuterol  108 (90 Base) MCG/ACT inhaler Commonly known as: VENTOLIN  HFA Inhale 2 puffs into the lungs every 6 (six) hours as needed for wheezing or shortness of breath.   amiodarone  200 MG tablet Commonly known as: PACERONE  Take 2 tablets (400 mg total) by mouth 2 (two) times daily for 7 days, THEN 1 tablet (200 mg total) daily. Start taking on: March 15, 2024   apixaban  5 MG Tabs tablet Commonly known as: ELIQUIS  Take 1 tablet (5 mg total) by mouth 2 (two) times daily.   aspirin  EC 81  MG tablet Take 1 tablet (81 mg total) by mouth daily.   carbamide peroxide 6.5 % OTIC solution Commonly known as: DEBROX Place 5 drops into both ears 2 (two) times daily.   cyclobenzaprine  5 MG tablet Commonly known as: FLEXERIL  Take 5 mg by mouth 3 (three) times daily as needed.   furosemide  40 MG tablet Commonly known as: LASIX  Take 1.5 tablets (60 mg total) by mouth daily.   gabapentin  100 MG  capsule Commonly known as: NEURONTIN  Take 1 capsule (100 mg total) by mouth 3 (three) times daily.   isosorbide  mononitrate 30 MG 24 hr tablet Commonly known as: IMDUR  Take 30 mg by mouth daily.   metoprolol  succinate 50 MG 24 hr tablet Commonly known as: TOPROL -XL Take 1 tablet (50 mg total) by mouth daily.   potassium chloride  10 MEQ tablet Commonly known as: KLOR-CON  Take 10 mEq by mouth daily.   rosuvastatin  10 MG tablet Commonly known as: CRESTOR  Take 10 mg by mouth daily. Pt dr instructed to start taking 1/2 tablet tomorrow 9/15   spironolactone  25 MG tablet Commonly known as: ALDACTONE  Take 1 tablet (25 mg total) by mouth daily.   traZODone  50 MG tablet Commonly known as: DESYREL  Take 1 tablet (50 mg total) by mouth at bedtime.        Follow-up Information     Donette Ellouise LABOR, FNP. Schedule an appointment as soon as possible for a visit in 1 week(s).   Specialty: Family Medicine Contact information: [address] [place] [address] [phone]         Fernand Denyse LABOR, MD .   Specialty: Cardiology                Subjective   Pt reports feeling well. Denies chest pain or SOB. No palpitations.   All questions and concerns were addressed at time of discharge.  Objective  Blood pressure 116/79, pulse 76, temperature 97.7 F (36.5 C), temperature source Oral, resp. rate 18, height 5' 9 (1.753 m), weight 108 kg, SpO2 96%.   General: Pt is alert, awake, not in acute distress Cardiovascular: RRR, S1/S2 +, no rubs, no gallops Respiratory: CTA bilaterally, no wheezing, no rhonchi Abdominal: Soft, NT, ND, bowel sounds + Extremities: no edema, no cyanosis  The results of significant diagnostics from this hospitalization (including imaging, microbiology, ancillary and laboratory) are listed below for reference.   Imaging studies: DG Chest 2 View Result Date: 03/14/2024 EXAM: 2 VIEW(S) XRAY OF THE CHEST 03/14/2024 03:14:00 PM  COMPARISON: 02/17/2024 CLINICAL HISTORY: Dizziness, chest pain x a few days. Reports worsening chest pain today. History of atrial fibrillation, coronary artery disease, congestive heart failure. FINDINGS: LUNGS AND PLEURA: Pulmonary interstitial prominence without definite pulmonary edema. No focal pulmonary opacity. No pleural effusion. No pneumothorax. HEART AND MEDIASTINUM: Cardiomegaly. No acute abnormality of the cardiac and mediastinal silhouettes. BONES AND SOFT TISSUES: No acute osseous abnormality. IMPRESSION: 1. Cardiomegaly. 2. Pulmonary interstitial prominence without definite pulmonary edema. Electronically signed by: Lonni Necessary MD 03/14/2024 04:04 PM EDT RP Workstation: [id number]   ECHOCARDIOGRAM COMPLETE Result Date: 02/18/2024    ECHOCARDIOGRAM REPORT   Patient Name:   Cabell BOWDEN BOODY Date of Exam: 02/18/2024 Medical Rec #:  [id number]      Height:       70.0 in Accession #:    [id number]     Weight:       250.0 lb Date of Birth:  1966/01/13       BSA:  2.295 m Patient Age:    58 years       BP:           99/78 mmHg Patient Gender: M              HR:           74 bpm. Exam Location:  [facility] Procedure: 2D Echo, Cardiac Doppler, 3D Echo, Color Doppler and Strain Analysis            (Both Spectral and Color Flow Doppler were utilized during            procedure). Indications:     CHF-acute systolic I50.21  History:         Patient has prior history of Echocardiogram examinations, most                  recent 02/15/2022. CHF, Previous Myocardial Infarction; Risk                  Factors:Hypertension. Tobacco abuse.  Sonographer:     Christopher Furnace Referring Phys:  [id number] JAN A MANSY Diagnosing Phys: Evalene Lunger MD  Sonographer Comments: Global longitudinal strain was attempted. IMPRESSIONS  1. Left ventricular ejection fraction, by estimation, is 30 to 35% (appears unchanged compared to prior studies). Left ventricular ejection fraction by PLAX is 30 %. The left ventricle has moderately  decreased function. The left ventricle demonstrates global hypokinesis. The left ventricular internal cavity size was moderately dilated. The average left ventricular global longitudinal strain is -4.6 %.  2. Right ventricular systolic function is normal. The right ventricular size is normal.  3. Left atrial size was moderately dilated.  4. The mitral valve is normal in structure. Moderate mitral valve regurgitation. No evidence of mitral stenosis.  5. The aortic valve is tricuspid. There is mild calcification of the aortic valve. Aortic valve regurgitation is not visualized. Aortic valve sclerosis/calcification is present, without any evidence of aortic stenosis.  6. There is borderline dilatation of the aortic root, measuring 39 mm.  7. The inferior vena cava is normal in size with greater than 50% respiratory variability, suggesting right atrial pressure of 3 mmHg. FINDINGS  Left Ventricle: Left ventricular ejection fraction, by estimation, is 30 to 35%. Left ventricular ejection fraction by PLAX is 30 %. The left ventricle has moderately decreased function. The left ventricle demonstrates global hypokinesis. The average left ventricular global longitudinal strain is -4.6 %. Strain was performed and the global longitudinal strain is indeterminate. The left ventricular internal cavity size was moderately dilated. There is no left ventricular hypertrophy. Left ventricular diastolic parameters are indeterminate. Right Ventricle: The right ventricular size is normal. No increase in right ventricular wall thickness. Right ventricular systolic function is normal. Left Atrium: Left atrial size was moderately dilated. Right Atrium: Right atrial size was normal in size. Pericardium: There is no evidence of pericardial effusion. Mitral Valve: The mitral valve is normal in structure. There is mild calcification of the mitral valve leaflet(s). Moderate mitral valve regurgitation. No evidence of mitral valve stenosis. MV peak  gradient, 6.0 mmHg. The mean mitral valve gradient is 3.0 mmHg. Tricuspid Valve: The tricuspid valve is normal in structure. Tricuspid valve regurgitation is not demonstrated. No evidence of tricuspid stenosis. Aortic Valve: The aortic valve is tricuspid. There is mild calcification of the aortic valve. Aortic valve regurgitation is not visualized. Aortic valve sclerosis/calcification is present, without any evidence of aortic stenosis. Aortic valve mean gradient measures 4.3 mmHg. Aortic valve peak gradient  measures 8.3 mmHg. Aortic valve area, by VTI measures 3.82 cm. Pulmonic Valve: The pulmonic valve was normal in structure. Pulmonic valve regurgitation is not visualized. No evidence of pulmonic stenosis. Aorta: The aortic root is normal in size and structure. There is borderline dilatation of the aortic root, measuring 39 mm. Venous: The inferior vena cava is normal in size with greater than 50% respiratory variability, suggesting right atrial pressure of 3 mmHg. IAS/Shunts: No atrial level shunt detected by color flow Doppler. Additional Comments: 3D was performed not requiring image post processing on an independent workstation and was indeterminate.  LEFT VENTRICLE PLAX 2D LV EF:         Left            Diastology                ventricular     LV e' medial:    6.74 cm/s                ejection        LV E/e' medial:  17.2                fraction by     LV e' lateral:   4.90 cm/s                PLAX is 30      LV E/e' lateral: 23.7                %. LVIDd:         7.60 cm         2D Longitudinal LVIDs:         6.50 cm         Strain LV PW:         1.50 cm         2D Strain GLS   -4.6 % LV IVS:        1.10 cm         Avg: LVOT diam:     3.15 cm LV SV:         109 LV SV Index:   48 LVOT Area:     7.79 cm  LV Volumes (MOD) LV vol d, MOD    231.0 ml A4C: LV vol s, MOD    156.0 ml A4C: LV SV MOD A4C:   231.0 ml RIGHT VENTRICLE RV Basal diam:  4.00 cm RV Mid diam:    3.00 cm LEFT ATRIUM             Index         RIGHT ATRIUM           Index LA diam:        5.00 cm 2.18 cm/m   RA Area:     12.50 cm LA Vol (A2C):   43.4 ml 18.91 ml/m  RA Volume:   30.90 ml  13.46 ml/m LA Vol (A4C):   60.9 ml 26.54 ml/m LA Biplane Vol: 55.6 ml 24.23 ml/m  AORTIC VALVE AV Area (Vmax):    3.99 cm AV Area (Vmean):   3.73 cm AV Area (VTI):     3.82 cm AV Vmax:           144.33 cm/s AV Vmean:          96.233 cm/s AV VTI:            0.285 m AV Peak Grad:      8.3 mmHg AV Mean  Grad:      4.3 mmHg LVOT Vmax:         73.90 cm/s LVOT Vmean:        46.100 cm/s LVOT VTI:          0.140 m LVOT/AV VTI ratio: 0.49  AORTA Ao Root diam: 3.90 cm MITRAL VALVE                TRICUSPID VALVE MV Area (PHT): 5.13 cm     TR Peak grad:   4.0 mmHg MV Area VTI:   3.64 cm     TR Vmax:        99.50 cm/s MV Peak grad:  6.0 mmHg MV Mean grad:  3.0 mmHg     SHUNTS MV Vmax:       1.22 m/s     Systemic VTI:  0.14 m MV Vmean:      79.3 cm/s    Systemic Diam: 3.15 cm MV Decel Time: 148 msec MV E velocity: 116.00 cm/s MV A velocity: 56.10 cm/s MV E/A ratio:  2.07 Evalene Lunger MD Electronically signed by Evalene Lunger MD Signature Date/Time: 02/18/2024/11:32:25 AM    Final    DG Chest Portable 1 View Result Date: 02/17/2024 CLINICAL DATA:  Chest pain EXAM: PORTABLE CHEST 1 VIEW COMPARISON:  Chest x-ray 10/24/2023 FINDINGS: The heart is enlarged. There central pulmonary vascular congestion. There is no focal lung infiltrate, pleural effusion or pneumothorax. No acute fractures are seen. IMPRESSION: Cardiomegaly with central pulmonary vascular congestion. Electronically Signed   By: Greig Pique M.D.   On: 02/17/2024 19:04    Labs: Basic Metabolic Panel: Recent Labs  Lab 03/14/24 1522 03/15/24 0412  NA 138 140  K 3.7 4.0  CL 104 108  CO2 24 26  GLUCOSE 161* 119*  BUN 16 19  CREATININE 1.15 0.96  CALCIUM  8.9 8.1*   CBC: Recent Labs  Lab 03/14/24 1522 03/15/24 0412  WBC 10.9* 9.9  HGB 15.7 13.8  HCT 46.5 42.1  MCV 93.2 96.6  PLT 348 306    Microbiology: Results for orders placed or performed during the hospital encounter of 10/19/23  Group A Strep by PCR     Status: None   Collection Time: 10/19/23  6:40 AM   Specimen: Throat; Sterile Swab  Result Value Ref Range Status   Group A Strep by PCR NOT DETECTED NOT DETECTED Final    Comment: Performed at [facility], [address] [place], [place] [address]  Resp panel by RT-PCR (RSV, Flu A&B, Covid) Throat     Status: None   Collection Time: 10/19/23  6:40 AM   Specimen: Throat; Nasal Swab  Result Value Ref Range Status   SARS Coronavirus 2 by RT PCR NEGATIVE NEGATIVE Final    Comment: (NOTE) SARS-CoV-2 target nucleic acids are NOT DETECTED.  The SARS-CoV-2 RNA is generally detectable in upper respiratory specimens during the acute phase of infection. The lowest concentration of SARS-CoV-2 viral copies this assay can detect is 138 copies/mL. A negative result does not preclude SARS-Cov-2 infection and should not be used as the sole basis for treatment or other patient management decisions. A negative result may occur with  improper specimen collection/handling, submission of specimen other than nasopharyngeal swab, presence of viral mutation(s) within the areas targeted by this assay, and inadequate number of viral copies(<138 copies/mL). A negative result must be combined with clinical observations, patient history, and epidemiological information. The expected result is Negative.  Fact Sheet for Patients:  BloggerCourse.com  Fact Sheet for Healthcare Providers:  SeriousBroker.it  This test is no t yet approved or cleared by the [place]  FDA and  has been authorized for detection and/or diagnosis of SARS-CoV-2 by FDA under an Emergency Use Authorization (EUA). This EUA will remain  in effect (meaning this test can be used) for the duration of the COVID-19 declaration under Section 564(b)(1) of the  Act, 21 U.S.C.section 360bbb-3(b)(1), unless the authorization is terminated  or revoked sooner.       Influenza A by PCR NEGATIVE NEGATIVE Final   Influenza B by PCR NEGATIVE NEGATIVE Final    Comment: (NOTE) The Xpert Xpress SARS-CoV-2/FLU/RSV plus assay is intended as an aid in the diagnosis of influenza from Nasopharyngeal swab specimens and should not be used as a sole basis for treatment. Nasal washings and aspirates are unacceptable for Xpert Xpress SARS-CoV-2/FLU/RSV testing.  Fact Sheet for Patients: BloggerCourse.com  Fact Sheet for Healthcare Providers: SeriousBroker.it  This test is not yet approved or cleared by the [place]  FDA and has been authorized for detection and/or diagnosis of SARS-CoV-2 by FDA under an Emergency Use Authorization (EUA). This EUA will remain in effect (meaning this test can be used) for the duration of the COVID-19 declaration under Section 564(b)(1) of the Act, 21 U.S.C. section 360bbb-3(b)(1), unless the authorization is terminated or revoked.     Resp Syncytial Virus by PCR NEGATIVE NEGATIVE Final    Comment: (NOTE) Fact Sheet for Patients: BloggerCourse.com  Fact Sheet for Healthcare Providers: SeriousBroker.it  This test is not yet approved or cleared by the [place]  FDA and has been authorized for detection and/or diagnosis of SARS-CoV-2 by FDA under an Emergency Use Authorization (EUA). This EUA will remain in effect (meaning this test can be used) for the duration of the COVID-19 declaration under Section 564(b)(1) of the Act, 21 U.S.C. section 360bbb-3(b)(1), unless the authorization is terminated or revoked.  Performed at [facility], [address], [place], [place] [address]     Time coordinating discharge: Over 30 minutes  Marien LITTIE Piety, MD  [facility] 03/15/2024, 12:12 PM

## 2024-03-15 NOTE — ED Notes (Signed)
 Per MD Cleatus, pt in A flutter, continue Diltiazem  at this time.

## 2024-03-15 NOTE — ED Notes (Signed)
 Irrigated pt's ears with peroxide and water after provider approval.  Scant wax removed, pt tolerated, ok

## 2024-03-15 NOTE — ED Notes (Signed)
 Notified MD Cleatus of discontinuation of Diltiazen drip.

## 2024-03-15 NOTE — ED Notes (Signed)
 Pt in bed, pt reports some neck pain/muscle cramps in his neck, pain med given.

## 2024-03-15 NOTE — ED Notes (Signed)
 MD Cleatus verbally notified of pt cardiac monitoring ventricular bigeminy and HR 100-141,  Pt asymptomatic. New orders being placed at this time.

## 2024-03-15 NOTE — Progress Notes (Addendum)
       CROSS COVER NOTE  NAME: Brian Porter MRN: [id number] DOB : May 31, 1966    Concern as stated by nurse / staff   Pt on dilt drip at 26ml/hr, just went into vent bigeminy and up to 130, asymptomatic, between 100-130, drip m,axed, BP 94/74 (82) Please advise      Pertinent findings on chart review: Admitted earlier with chest pain secondary to A-fib with RVR.  Patient Assessment      03/14/2024   11:30 PM 03/14/2024   11:21 PM 03/14/2024   10:30 PM  Vitals with BMI  Systolic 107 124 897  Diastolic 78 99 70  Pulse   73  Physical Exam Constitutional:      General: He is sleeping.  Cardiovascular:     Rate and Rhythm: Rhythm irregular.  Pulmonary:     Breath sounds: Normal breath sounds.         Assessment and  Interventions   Assessment:  A flutter with controlled rate and occasional PVCs. Ventricular bigeminy NOT appreciated at this time (Several EKGs/tracings reviewed)  Plan: Continue Cardizem  infusion Will continue close monitoring with consideration for amiodarone  if persistent tachyarrhythmia, or evidence of ventricular tachycardia X X   Addendum at 1:48 AM: Patient converted to sinus bradycardia then NSR at 60, Cardizem  being weaned off    CRITICAL CARE Performed by: Delayne LULLA Solian   Total critical care time: 35 minutes  Critical care time was exclusive of separately billable procedures and treating other patients.  Critical care was necessary to treat or prevent imminent or life-threatening deterioration.  Critical care was time spent personally by me on the following activities: development of treatment plan with patient and/or surrogate as well as nursing, discussions with consultants, evaluation of patient's response to treatment, examination of patient, obtaining history from patient or surrogate, ordering and performing treatments and interventions, ordering and review of laboratory studies, ordering and review of radiographic studies, pulse  oximetry and re-evaluation of patient's condition.

## 2024-03-15 NOTE — Consult Note (Signed)
 [facility] CLINIC CARDIOLOGY CONSULT NOTE       Patient ID: Brian Porter MRN: [id number] DOB/AGE: February 01, 1966 58 y.o.  Admit date: 03/14/2024 Referring Physician Dr. Lawence Primary Physician [facility] Primary Cardiologist Dr. Dewane Reason for Consultation AF RVR  HPI: Brian Porter is a 58 y.o. male  with a past medical history of chronic HFrEF (EF 30-35% - 01/2024), coronary artery disease s/p PCI to LAD (2014) with recent LHC which revealed three-vessel CAD, hypertension, history CVA, history of tobacco use, OSA (not on CPAP), paroxysmal atrial fibrillation who presented to the ED on 03/14/2024 for intermittent chest pain/pressure with dizziness. Found to be in atrial fibrillation RVR rates 140s.  Cardiology was consulted for further evaluation.   Work up in the ED notable for sodium 138, potassium 3.7, creatinine 1.15, hemoglobin 15.7, platelets 348.  EKG with atrial fibrillation, rate 140s and repeat EKGs revealing atrial flutter.  Troponins minimally elevated and flat 29 > 28 > 31 > 30.  CXR with cardiomegaly and mild pulmonary vascular congestion.  Patient started on IV Cardizem  in ED and weaned off on 09/15 at 0300.  At the time of my evaluation this afternoon, patient was resting comfortably in ED stretcher. We discussed patients sxs in further detail. Patient states late Saturday evening while eating dinner he had chest pain/pressure with dizziness that was intermittent and worsened over last few days. Patient states he knew he was in AF RVR because that's how he feels. Patient states his chest pain/pressure and dizziness resolved after converting to sinus rhythm. Patient states he feels well overall and denies any SOB or syncope. Patient eager to go home.   Pertinent Cardiac History (Most recent) LHC (02/15/2022)   RPAV-2 lesion is 90% stenosed.   RPDA lesion is 75% stenosed.   2nd RPL lesion is 80% stenosed.   1st Mrg lesion is 90% stenosed.   1st Diag lesion is 90%  stenosed.   2nd Diag lesion is 90% stenosed.   Mid LAD-1 lesion is 10% stenosed.   Mid LAD-2 lesion is 50% stenosed.   RPAV-1 lesion is 90% stenosed.   There is moderate to severe left ventricular systolic dysfunction.   The left ventricular ejection fraction is 25-35% by visual estimate.   1.  NSTEMI 2.  Three-vessel coronary artery disease 90% stenosis D1, 90% stenosis D2, 90% stenosis OM1, 90% stenosis RPAV 1 and 2, 75% stenosis RPDA, without obvious culprit vessel, not ideal for percutaneous revascularization or surgical revascularization 3.  Moderate to severe dilated cardiomyopathy, with LVEF 30%, with multiple regional wall motion abnormalities  Review of systems complete and found to be negative unless listed above    Past Medical History:  Diagnosis Date   Acute on chronic systolic heart failure (HCC) 08/27/2016   CAD (coronary artery disease)    CHF (congestive heart failure) (HCC)    CVA (cerebral vascular accident) (HCC) 10/21/2020   GERD (gastroesophageal reflux disease)    Hypertension    MI, old    NSTEMI (non-ST elevated myocardial infarction) (HCC) 02/14/2022   Sleep apnea    Tobacco abuse 11/02/2015    Past Surgical History:  Procedure Laterality Date   CARDIAC CATHETERIZATION Right 10/16/2015   Procedure: Left Heart Cath and Coronary Angiography;  Surgeon: Denyse DELENA Bathe, MD;  Location: [facility] INVASIVE CV LAB;  Service: Cardiovascular;  Laterality: Right;   CORONARY ANGIOPLASTY WITH STENT PLACEMENT  approx 2 years ago   HERNIA REPAIR Left 11/29/2016   [facility]   LEFT  HEART CATH AND CORONARY ANGIOGRAPHY N/A 02/15/2022   Procedure: LEFT HEART CATH AND CORONARY ANGIOGRAPHY;  Surgeon: Ammon Blunt, MD;  Location: [facility] INVASIVE CV LAB;  Service: Cardiovascular;  Laterality: N/A;  12:00 PM   PARTIAL NEPHRECTOMY Left    SPLENECTOMY      (Not in a hospital admission)  Social History   Socioeconomic History   Marital status: Single    Spouse name: Not on file    Number of children: Not on file   Years of education: Not on file   Highest education level: Not on file  Occupational History   Occupation: unemployed  Tobacco Use   Smoking status: Former    Current packs/day: 0.00    Types: Cigarettes    Quit date: 10/24/2020    Years since quitting: 3.3   Smokeless tobacco: Never  Vaping Use   Vaping status: Never Used  Substance and Sexual Activity   Alcohol use: No    Alcohol/week: 0.0 standard drinks of alcohol   Drug use: Not Currently    Comment: percocet   Sexual activity: Not Currently  Other Topics Concern   Not on file  Social History Narrative   Not on file   Social Drivers of Health   Financial Resource Strain: Low Risk  (09/12/2022)   Overall Financial Resource Strain (CARDIA)    Difficulty of Paying Living Expenses: Not very hard  Food Insecurity: No Food Insecurity (09/12/2022)   Hunger Vital Sign    Worried About Running Out of Food in the Last Year: Never true    Ran Out of Food in the Last Year: Never true  Transportation Needs: No Transportation Needs (09/12/2022)   PRAPARE - Administrator, Civil Service (Medical): No    Lack of Transportation (Non-Medical): No  Physical Activity: Sufficiently Active (09/12/2022)   Exercise Vital Sign    Days of Exercise per Week: 7 days    Minutes of Exercise per Session: 30 min  Stress: No Stress Concern Present (09/12/2022)   [employer] of Occupational Health - Occupational Stress Questionnaire    Feeling of Stress : Not at all  Social Connections: Moderately Integrated (09/12/2022)   Social Connection and Isolation Panel    Frequency of Communication with Friends and Family: More than three times a week    Frequency of Social Gatherings with Friends and Family: Once a week    Attends Religious Services: More than 4 times per year    Active Member of [employer] or Organizations: No    Attends Banker Meetings: Never    Marital Status: Living with partner   Intimate Partner Violence: Not At Risk (09/12/2022)   Humiliation, Afraid, Rape, and Kick questionnaire    Fear of Current or Ex-Partner: No    Emotionally Abused: No    Physically Abused: No    Sexually Abused: No    Family History  Problem Relation Age of Onset   Hypertension Mother    Other Mother        covid pneumonia   Congestive Heart Failure Mother    Stroke Father    Heart attack Father 30   Diabetes Father    Hypertension Father    Diabetes Mellitus II Brother    Alzheimer's disease Maternal Grandmother    Heart attack Maternal Grandfather    Cancer Paternal Grandmother    Stroke Paternal Grandfather      Vitals:   03/15/24 0416 03/15/24 0500 03/15/24 0600 03/15/24 0748  BP:  106/73 108/72 116/80  Pulse:  65 69 70  Resp:  16 18 17   Temp: (!) 97.4 F (36.3 C)   97.7 F (36.5 C)  TempSrc: Oral   Oral  SpO2:  95% 100% 100%  Weight:      Height:        PHYSICAL EXAM General: Chronically ill-appearing male, well nourished, in no acute distress. HEENT: Normocephalic and atraumatic. Neck: No JVD.   Lungs: Normal respiratory effort on room air. Clear bilaterally to auscultation. No wheezes, crackles, rhonchi.  Heart: HRRR. Normal S1 and S2 without gallops or murmurs.  Abdomen: Non-distended appearing.  Msk: Normal strength and tone for age. Extremities: Warm and well perfused. No clubbing, cyanosis, edema.  Neuro: Alert and oriented X 3. Psych: Answers questions appropriately.   Labs: Basic Metabolic Panel: Recent Labs    03/14/24 1522 03/15/24 0412  NA 138 140  K 3.7 4.0  CL 104 108  CO2 24 26  GLUCOSE 161* 119*  BUN 16 19  CREATININE 1.15 0.96  CALCIUM  8.9 8.1*   Liver Function Tests: No results for input(s): AST, ALT, ALKPHOS, BILITOT, PROT, ALBUMIN in the last 72 hours. No results for input(s): LIPASE, AMYLASE in the last 72 hours. CBC: Recent Labs    03/14/24 1522 03/15/24 0412  WBC 10.9* 9.9  HGB 15.7 13.8  HCT 46.5  42.1  MCV 93.2 96.6  PLT 348 306   Cardiac Enzymes: Recent Labs    03/14/24 1636 03/14/24 2319 03/15/24 0412  TROPONINIHS 28* 31* 30*   BNP: No results for input(s): BNP in the last 72 hours. D-Dimer: No results for input(s): DDIMER in the last 72 hours. Hemoglobin A1C: No results for input(s): HGBA1C in the last 72 hours. Fasting Lipid Panel: No results for input(s): CHOL, HDL, LDLCALC, TRIG, CHOLHDL, LDLDIRECT in the last 72 hours. Thyroid  Function Tests: No results for input(s): TSH, T4TOTAL, T3FREE, THYROIDAB in the last 72 hours.  Invalid input(s): FREET3 Anemia Panel: No results for input(s): VITAMINB12, FOLATE, FERRITIN, TIBC, IRON, RETICCTPCT in the last 72 hours.   Radiology: DG Chest 2 View Result Date: 03/14/2024 EXAM: 2 VIEW(S) XRAY OF THE CHEST 03/14/2024 03:14:00 PM COMPARISON: 02/17/2024 CLINICAL HISTORY: Dizziness, chest pain x a few days. Reports worsening chest pain today. History of atrial fibrillation, coronary artery disease, congestive heart failure. FINDINGS: LUNGS AND PLEURA: Pulmonary interstitial prominence without definite pulmonary edema. No focal pulmonary opacity. No pleural effusion. No pneumothorax. HEART AND MEDIASTINUM: Cardiomegaly. No acute abnormality of the cardiac and mediastinal silhouettes. BONES AND SOFT TISSUES: No acute osseous abnormality. IMPRESSION: 1. Cardiomegaly. 2. Pulmonary interstitial prominence without definite pulmonary edema. Electronically signed by: Lonni Necessary MD 03/14/2024 04:04 PM EDT RP Workstation: [id number]   ECHOCARDIOGRAM COMPLETE Result Date: 02/18/2024    ECHOCARDIOGRAM REPORT   Patient Name:   Brian Porter Date of Exam: 02/18/2024 Medical Rec #:  [id number]      Height:       70.0 in Accession #:    [id number]     Weight:       250.0 lb Date of Birth:  30-Jun-1966       BSA:          2.295 m Patient Age:    58 years       BP:           99/78 mmHg Patient Gender: M               HR:  74 bpm. Exam Location:  [facility] Procedure: 2D Echo, Cardiac Doppler, 3D Echo, Color Doppler and Strain Analysis            (Both Spectral and Color Flow Doppler were utilized during            procedure). Indications:     CHF-acute systolic I50.21  History:         Patient has prior history of Echocardiogram examinations, most                  recent 02/15/2022. CHF, Previous Myocardial Infarction; Risk                  Factors:Hypertension. Tobacco abuse.  Sonographer:     Christopher Furnace Referring Phys:  [id number] JAN A MANSY Diagnosing Phys: Evalene Lunger MD  Sonographer Comments: Global longitudinal strain was attempted. IMPRESSIONS  1. Left ventricular ejection fraction, by estimation, is 30 to 35% (appears unchanged compared to prior studies). Left ventricular ejection fraction by PLAX is 30 %. The left ventricle has moderately decreased function. The left ventricle demonstrates global hypokinesis. The left ventricular internal cavity size was moderately dilated. The average left ventricular global longitudinal strain is -4.6 %.  2. Right ventricular systolic function is normal. The right ventricular size is normal.  3. Left atrial size was moderately dilated.  4. The mitral valve is normal in structure. Moderate mitral valve regurgitation. No evidence of mitral stenosis.  5. The aortic valve is tricuspid. There is mild calcification of the aortic valve. Aortic valve regurgitation is not visualized. Aortic valve sclerosis/calcification is present, without any evidence of aortic stenosis.  6. There is borderline dilatation of the aortic root, measuring 39 mm.  7. The inferior vena cava is normal in size with greater than 50% respiratory variability, suggesting right atrial pressure of 3 mmHg. FINDINGS  Left Ventricle: Left ventricular ejection fraction, by estimation, is 30 to 35%. Left ventricular ejection fraction by PLAX is 30 %. The left ventricle has moderately decreased function. The left  ventricle demonstrates global hypokinesis. The average left ventricular global longitudinal strain is -4.6 %. Strain was performed and the global longitudinal strain is indeterminate. The left ventricular internal cavity size was moderately dilated. There is no left ventricular hypertrophy. Left ventricular diastolic parameters are indeterminate. Right Ventricle: The right ventricular size is normal. No increase in right ventricular wall thickness. Right ventricular systolic function is normal. Left Atrium: Left atrial size was moderately dilated. Right Atrium: Right atrial size was normal in size. Pericardium: There is no evidence of pericardial effusion. Mitral Valve: The mitral valve is normal in structure. There is mild calcification of the mitral valve leaflet(s). Moderate mitral valve regurgitation. No evidence of mitral valve stenosis. MV peak gradient, 6.0 mmHg. The mean mitral valve gradient is 3.0 mmHg. Tricuspid Valve: The tricuspid valve is normal in structure. Tricuspid valve regurgitation is not demonstrated. No evidence of tricuspid stenosis. Aortic Valve: The aortic valve is tricuspid. There is mild calcification of the aortic valve. Aortic valve regurgitation is not visualized. Aortic valve sclerosis/calcification is present, without any evidence of aortic stenosis. Aortic valve mean gradient measures 4.3 mmHg. Aortic valve peak gradient measures 8.3 mmHg. Aortic valve area, by VTI measures 3.82 cm. Pulmonic Valve: The pulmonic valve was normal in structure. Pulmonic valve regurgitation is not visualized. No evidence of pulmonic stenosis. Aorta: The aortic root is normal in size and structure. There is borderline dilatation of the aortic root, measuring 39 mm. Venous: The inferior vena  cava is normal in size with greater than 50% respiratory variability, suggesting right atrial pressure of 3 mmHg. IAS/Shunts: No atrial level shunt detected by color flow Doppler. Additional Comments: 3D was  performed not requiring image post processing on an independent workstation and was indeterminate.  LEFT VENTRICLE PLAX 2D LV EF:         Left            Diastology                ventricular     LV e' medial:    6.74 cm/s                ejection        LV E/e' medial:  17.2                fraction by     LV e' lateral:   4.90 cm/s                PLAX is 30      LV E/e' lateral: 23.7                %. LVIDd:         7.60 cm         2D Longitudinal LVIDs:         6.50 cm         Strain LV PW:         1.50 cm         2D Strain GLS   -4.6 % LV IVS:        1.10 cm         Avg: LVOT diam:     3.15 cm LV SV:         109 LV SV Index:   48 LVOT Area:     7.79 cm  LV Volumes (MOD) LV vol d, MOD    231.0 ml A4C: LV vol s, MOD    156.0 ml A4C: LV SV MOD A4C:   231.0 ml RIGHT VENTRICLE RV Basal diam:  4.00 cm RV Mid diam:    3.00 cm LEFT ATRIUM             Index        RIGHT ATRIUM           Index LA diam:        5.00 cm 2.18 cm/m   RA Area:     12.50 cm LA Vol (A2C):   43.4 ml 18.91 ml/m  RA Volume:   30.90 ml  13.46 ml/m LA Vol (A4C):   60.9 ml 26.54 ml/m LA Biplane Vol: 55.6 ml 24.23 ml/m  AORTIC VALVE AV Area (Vmax):    3.99 cm AV Area (Vmean):   3.73 cm AV Area (VTI):     3.82 cm AV Vmax:           144.33 cm/s AV Vmean:          96.233 cm/s AV VTI:            0.285 m AV Peak Grad:      8.3 mmHg AV Mean Grad:      4.3 mmHg LVOT Vmax:         73.90 cm/s LVOT Vmean:        46.100 cm/s LVOT VTI:          0.140 m LVOT/AV VTI ratio: 0.49  AORTA Ao Root diam: 3.90 cm MITRAL  VALVE                TRICUSPID VALVE MV Area (PHT): 5.13 cm     TR Peak grad:   4.0 mmHg MV Area VTI:   3.64 cm     TR Vmax:        99.50 cm/s MV Peak grad:  6.0 mmHg MV Mean grad:  3.0 mmHg     SHUNTS MV Vmax:       1.22 m/s     Systemic VTI:  0.14 m MV Vmean:      79.3 cm/s    Systemic Diam: 3.15 cm MV Decel Time: 148 msec MV E velocity: 116.00 cm/s MV A velocity: 56.10 cm/s MV E/A ratio:  2.07 Evalene Lunger MD Electronically signed by Evalene Lunger MD Signature Date/Time: 02/18/2024/11:32:25 AM    Final    DG Chest Portable 1 View Result Date: 02/17/2024 CLINICAL DATA:  Chest pain EXAM: PORTABLE CHEST 1 VIEW COMPARISON:  Chest x-ray 10/24/2023 FINDINGS: The heart is enlarged. There central pulmonary vascular congestion. There is no focal lung infiltrate, pleural effusion or pneumothorax. No acute fractures are seen. IMPRESSION: Cardiomegaly with central pulmonary vascular congestion. Electronically Signed   By: Greig Pique M.D.   On: 02/17/2024 19:04    ECHO as above (02/18/24)  TELEMETRY reviewed by me 03/15/2024: Sinus rhythm, rate 70s with frequent PVCs  EKG reviewed by me: Atrial fibrillation, rate 144 bpm; atrial flutter, rate 70 bpm  Data reviewed by me 03/15/2024: last 24h vitals tele labs imaging I/O ED provider note, admission H&P.  Principal Problem:   Atrial fibrillation with RVR (HCC) Active Problems:   Dyslipidemia   Essential hypertension   Peripheral neuropathy   Paroxysmal atrial fibrillation with RVR (HCC)   Chronic combined systolic and diastolic CHF (congestive heart failure) (HCC)    ASSESSMENT AND PLAN:  Brian Porter is a 58 y.o. male  with a past medical history of chronic HFrEF (EF 30-35% - 01/2024), coronary artery disease s/p PCI to LAD (2014) with recent LHC which revealed three-vessel CAD, hypertension, history CVA, history of tobacco use, OSA (not on CPAP), paroxysmal atrial fibrillation who presented to the ED on 03/14/2024 for intermittent chest pain/pressure with dizziness. Found to be in atrial fibrillation RVR rates 140s.  Cardiology was consulted for further evaluation.   # Paroxysmal atrial fibrillation # Atrial fibrillation/flutter RVR - Monitor and replenish electrolytes for a goal K >4, Mag >2  - Continue home Eliquis  5 mg twice daily for stroke risk reduction. - Continue metoprolol  succinate 50 mg daily. - Ordered PO amio 400 mg BID for 10 days, then 200 mg daily for rhythm  control - Plan for 7 day cardiac monitor placement for AF and PVC burden.  - Consider outpatient follow-up with EP.   # Chronic HFrEF -Continue home Lasix  60 mg daily. -Continue metoprolol  as stated above. -Continue spironolactone  25 mg daily. -Has documented allergy to Jardiance  and Entresto .  # Coronary artery disease s/p stent to LAD (2021) # Hypertension # Hyperlipidemia -Continue home aspirin  81 mg, Crestor  10 mg daily. -Continue home Imdur  30 mg daily. -Continue metoprolol  stated above. -Minimally elevated and flat in setting of AF RVR is most consistent with demand/supply mismatch and not ACS. No further cardiac diagnostics at this time.  Ok for discharge today from a cardiac perspective. Will arrange for follow up in clinic with Dr. Custovic on 09/24 and on 10/08 for holter results follow-up.    This patient's plan  of care was discussed and created with Dr. Custovic and he is in agreement.  Signed: Dorene Comfort, PA-C  03/15/2024, 11:17 AM [facility] Cardiology

## 2024-03-15 NOTE — ED Notes (Signed)
 Pt states that he is hearing a little bit better at this time, updated pt post conversation with MD at [facility] continued treatment.

## 2024-03-15 NOTE — ED Notes (Addendum)
 Pt sitting in chair, pt has portable heart monitor placed by cards tech, pt states that he has some leg pain and requests pain med, meds given.  Pt states that he is ready to go home, d/c pt iv, cath intact, read and reviewed d/c instructions and follow up, pt verbalized understanding, pt ambulatory from department.

## 2024-03-15 NOTE — Progress Notes (Incomplete)
 PROGRESS NOTE  Brian Porter [facility]    DOB: 25-Jul-1965, 58 y.o.  FMW:[id number]    Code Status: Full Code   DOA: 03/14/2024   LOS: 1   Brief hospital course  Brian Porter is a 58 y.o. male with a PMH significant for ***.  They presented from *** to the ED on 03/14/2024 with *** x *** days. ***  In the ED, it was found that they had ***.  Significant findings included ***.  They were initially treated with ***.   Patient was admitted to medicine service for further workup and management of *** as outlined in detail below.  03/15/24 -***  Assessment & Plan  Principal Problem:   Atrial fibrillation with RVR (HCC) Active Problems:   Essential hypertension   Dyslipidemia   Peripheral neuropathy   Paroxysmal atrial fibrillation with RVR (HCC)   Chronic combined systolic and diastolic CHF (congestive heart failure) (HCC)  Atrial fibrillation with RVR (HCC) - The patient will be admitted to an observation progressive unit bed. - Will continue IV Cardizem  drip for now. - Will continue Toprol -XL for now. - Will continue course. - Will follow serial troponin. - Chest pain is likely secondary to his atrial fibrillation. - Cardiology consult will be obtained. - Dr. Florencio was notified about the patient.   Essential hypertension - Will continue antihypertensive therapy.   Dyslipidemia - Will continue statin therapy.   Chronic combined systolic and diastolic CHF (congestive heart failure) (HCC) - Will continue Lasix  and Imdur , Toprol -XL as well as Aldactone .   Peripheral neuropathy - Will continue Neurontin .  *** -   *** -   *** -   Body mass index is 35.15 kg/m.  VTE ppx:  apixaban  (ELIQUIS ) tablet 5 mg   Diet:     Diet   Diet Heart Room service appropriate? Yes; Fluid consistency: Thin   Consultants: ***  Subjective 03/15/24    Pt reports ***   Objective  Blood pressure 116/80, pulse 70, temperature 97.7 F (36.5 C), temperature source Oral, resp. rate  17, height 5' 9 (1.753 m), weight 108 kg, SpO2 100%. No intake or output data in the 24 hours ending 03/15/24 0801 Filed Weights   03/14/24 1459  Weight: 108 kg     Physical Exam: *** General: awake, alert, NAD HEENT: atraumatic, clear conjunctiva, anicteric sclera, MMM, hearing grossly normal Respiratory: normal respiratory effort. Cardiovascular: quick capillary refill, normal S1/S2, RRR, no JVD, murmurs Gastrointestinal: soft, NT, ND Nervous: A&O x3. no gross focal neurologic deficits, normal speech Extremities: moves all equally, no edema, normal tone Skin: dry, intact, normal temperature, normal color. No rashes, lesions or ulcers on exposed skin Psychiatry: normal mood, congruent affect  Labs   I have personally reviewed the following labs and imaging studies CBC    Component Value Date/Time   WBC 9.9 03/15/2024 0412   RBC 4.36 03/15/2024 0412   HGB 13.8 03/15/2024 0412   HGB 15.1 12/05/2021 1221   HCT 42.1 03/15/2024 0412   HCT 44.5 12/05/2021 1221   PLT 306 03/15/2024 0412   PLT 405 12/05/2021 1221   MCV 96.6 03/15/2024 0412   MCV 90 12/05/2021 1221   MCV 93 09/21/2013 1245   MCH 31.7 03/15/2024 0412   MCHC 32.8 03/15/2024 0412   RDW 13.2 03/15/2024 0412   RDW 11.8 12/05/2021 1221   RDW 13.3 09/21/2013 1245   LYMPHSABS 2.8 10/19/2023 0839   LYMPHSABS 2.8 12/05/2021 1221   MONOABS 2.2 (H) 10/19/2023 9160  EOSABS 0.7 (H) 10/19/2023 0839   EOSABS 0.7 (H) 12/05/2021 1221   BASOSABS 0.1 10/19/2023 0839   BASOSABS 0.1 12/05/2021 1221      Latest Ref Rng & Units 03/15/2024    4:12 AM 03/14/2024    3:22 PM 02/18/2024    2:43 AM  BMP  Glucose 70 - 99 mg/dL 880  838  870   BUN 6 - 20 mg/dL 19  16  22    Creatinine 0.61 - 1.24 mg/dL 9.03  8.84  9.13   Sodium 135 - 145 mmol/L 140  138  139   Potassium 3.5 - 5.1 mmol/L 4.0  3.7  3.8   Chloride 98 - 111 mmol/L 108  104  104   CO2 22 - 32 mmol/L 26  24  26    Calcium  8.9 - 10.3 mg/dL 8.1  8.9  8.3     DG Chest 2  View Result Date: 03/14/2024 EXAM: 2 VIEW(S) XRAY OF THE CHEST 03/14/2024 03:14:00 PM COMPARISON: 02/17/2024 CLINICAL HISTORY: Dizziness, chest pain x a few days. Reports worsening chest pain today. History of atrial fibrillation, coronary artery disease, congestive heart failure. FINDINGS: LUNGS AND PLEURA: Pulmonary interstitial prominence without definite pulmonary edema. No focal pulmonary opacity. No pleural effusion. No pneumothorax. HEART AND MEDIASTINUM: Cardiomegaly. No acute abnormality of the cardiac and mediastinal silhouettes. BONES AND SOFT TISSUES: No acute osseous abnormality. IMPRESSION: 1. Cardiomegaly. 2. Pulmonary interstitial prominence without definite pulmonary edema. Electronically signed by: Lonni Necessary MD 03/14/2024 04:04 PM EDT RP Workstation: [id number]    Disposition Plan & Communication  Patient status: Observation  Admitted From: {From:23814} Planned disposition location: {PLAN; DISPOSITION:26386} Anticipated discharge date: *** pending ***  Family Communication: ***    Author: Marien LITTIE Piety, DO [facility] 03/15/2024, 8:01 AM   Available by Epic secure chat 7AM-7PM. If 7PM-7AM, please contact night-coverage.  [facility] contact information found on [email or web address].

## 2024-03-15 NOTE — ED Notes (Signed)
 Pt in bed, pt states that his hearing is muffled, md notified. Pharm called for debrox.

## 2024-03-18 ENCOUNTER — Ambulatory Visit: Payer: Self-pay

## 2024-03-18 ENCOUNTER — Telehealth: Payer: Self-pay | Admitting: Family

## 2024-03-18 NOTE — Telephone Encounter (Signed)
 Copied from CRM [id number]. Topic: Clinical - Red Word Triage >> Mar 18, 2024 10:06 AM Laurier BROCKS wrote: Red Word that prompted transfer to Nurse Triage: Since taking the following medication: amiodarone  (PACERONE ) 200 MG tablet the patient has been nauseous, dizzy and unable to sleep for the last 2 days

## 2024-03-18 NOTE — Telephone Encounter (Signed)
 Reason for Disposition . Nursing judgment or information in reference  Answer Assessment - Initial Assessment Questions 1. REASON FOR CALL: What is your main concern right now?    Pt stated he was seen In ED on 9/14 and prescribed to take 800mg  Amiodarone  daily. Pt hasn't been able to sleep and feels nauseated over last 2 days. Pt is asking where to drop heart monitor off. Pt stated if was given to him at ED. Pt stated he called Cardiologist but hasn't heard back. Pt is not established with [facility]. RN advised pt to seek care at ED if he doesn't hear back from cardiologist.  Protocols used: No Guideline Available-A-AH

## 2024-03-18 NOTE — Telephone Encounter (Signed)
 Called to confirm/remind patient of their appointment at the [facility] on 03/18/24.   Appointment:   [] Confirmed  [x] Left mess   [] No answer/No voice mail  [] VM Full/unable to leave message  [] Phone not in service  Patient reminded to bring all medications and/or complete list.  Confirmed patient has transportation. Gave directions, instructed to utilize valet parking.

## 2024-03-18 NOTE — Progress Notes (Unsigned)
 [facility] Note   Referring Physician: PCP: [facility] Cardiologist: None   Chief Complaint:    HPI:  PCP: [facility] (last seen 05/24) Primary Cardiologist: Clarisa Kung, PA (last seen 09/23)  HPI:  Brian Porter is a 58 y/o male with a history of obstructive sleep apnea (without CPAP), CAD, COPD, HTN, GERD, MI, current tobacco use and chronic heart failure.   Echo 02/15/22: EF of 35-40% along with mild Brian. Echo report from 10/22/20 reviewed and showed an EF of 25-30% along with mild Brian/AR. Echo report from 05/24/2019 reviewed and showed an EF of 30-35% along with trivial TR/PR. Echo done 10/15/15 and showed an EF of 35% along with moderate Brian/TR and an elevated PA pressure of 48 mm Hg.   LHC done 02/15/22:    RPAV-2 lesion is 90% stenosed.   RPDA lesion is 75% stenosed.   2nd RPL lesion is 80% stenosed.   1st Mrg lesion is 90% stenosed.   1st Diag lesion is 90% stenosed.   2nd Diag lesion is 90% stenosed.   Mid LAD-1 lesion is 10% stenosed.   Mid LAD-2 lesion is 50% stenosed.   RPAV-1 lesion is 90% stenosed.   There is moderate to severe left ventricular systolic dysfunction.   The left ventricular ejection fraction is 25-35% by visual estimate.  1.  NSTEMI 2.  Three-vessel coronary artery disease 90% stenosis D1, 90% stenosis D2, 90% stenosis OM1, 90% stenosis RPAV 1 and 2, 75% stenosis RPDA, without obvious culprit vessel, not ideal for percutaneous revascularization or surgical revascularization 3.  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 Recommended treatment for cardiomyopathy.   Was in the ED 09/06/22 due to cough and right sided chest pain thought to be due to COPD exacerbation.   He presents today for a HF follow-up visit with a chief complaint of minimal SOB with moderate  exertion. Chronic in nature. Has associated fatigue, intermittent chest pain, dizziness, pedal edema & gradual weight gain along with this. Denies cough, abdominal distention or difficulty sleeping.   Weighing daily. Using some salt but less than he used to. Drinks anywhere from 3-6 bottles of water daily. Tends to eat cantaloupe for breakfast. Overall, he says that he's feeling well.      Review of Systems: [y] = yes, [ ]  = no   General: Weight gain [ ] ; Weight loss [ ] ; Anorexia [ ] ; Fatigue [ ] ; Fever [ ] ; Chills [ ] ; Weakness [ ]   Cardiac: Chest pain/pressure [ ] ; Resting SOB [ ] ; Exertional SOB [ ] ; Orthopnea [ ] ; Pedal Edema [ ] ; Palpitations [ ] ; Syncope [ ] ; Presyncope [ ] ; Paroxysmal nocturnal dyspnea[ ]   Pulmonary: Cough [ ] ; Wheezing[ ] ; Hemoptysis[ ] ; Sputum [ ] ; Snoring [ ]   GI: Vomiting[ ] ; Dysphagia[ ] ; Melena[ ] ; Hematochezia [ ] ; Heartburn[ ] ; Abdominal pain [ ] ; Constipation [ ] ; Diarrhea [ ] ; BRBPR [ ]   GU: Hematuria[ ] ; Dysuria [ ] ; Nocturia[ ]   Vascular: Pain in legs with walking [ ] ; Pain in feet with lying flat [ ] ; Non-healing sores [ ] ; Stroke [ ] ; TIA [ ] ; Slurred speech [ ] ;  Neuro: Headaches[ ] ; Vertigo[ ] ; Seizures[ ] ; Paresthesias[ ] ;Blurred vision [ ] ; Diplopia [ ] ; Vision changes [ ]   Ortho/Skin: Arthritis [ ] ; Joint pain [ ] ; Muscle pain [ ] ; Joint  swelling [ ] ; Back Pain [ ] ; Rash [ ]   Psych: Depression[ ] ; Anxiety[ ]   Heme: Bleeding problems [ ] ; Clotting disorders [ ] ; Anemia [ ]   Endocrine: Diabetes [ ] ; Thyroid  dysfunction[ ]    Past Medical History:  Diagnosis Date   Acute on chronic systolic heart failure (HCC) 08/27/2016   CAD (coronary artery disease)    CHF (congestive heart failure) (HCC)    CVA (cerebral vascular accident) (HCC) 10/21/2020   GERD (gastroesophageal reflux disease)    Hypertension    MI, old    NSTEMI (non-ST elevated myocardial infarction) (HCC) 02/14/2022   Sleep apnea    Tobacco abuse 11/02/2015    Current Outpatient  Medications  Medication Sig Dispense Refill   acetaminophen  (TYLENOL ) 650 MG CR tablet Take 650 mg by mouth every 8 (eight) hours as needed for pain.     albuterol  (VENTOLIN  HFA) 108 (90 Base) MCG/ACT inhaler Inhale 2 puffs into the lungs every 6 (six) hours as needed for wheezing or shortness of breath. 8 g 2   amiodarone  (PACERONE ) 200 MG tablet Take 2 tablets (400 mg total) by mouth 2 (two) times daily for 7 days, THEN 1 tablet (200 mg total) daily. 58 tablet 0   apixaban  (ELIQUIS ) 5 MG TABS tablet Take 1 tablet (5 mg total) by mouth 2 (two) times daily. 180 tablet 0   aspirin  81 MG EC tablet Take 1 tablet (81 mg total) by mouth daily. 30 tablet 2   carbamide peroxide (DEBROX) 6.5 % OTIC solution Place 5 drops into both ears 2 (two) times daily. 15 mL 0   cyclobenzaprine  (FLEXERIL ) 5 MG tablet Take 5 mg by mouth 3 (three) times daily as needed.     furosemide  (LASIX ) 40 MG tablet Take 1.5 tablets (60 mg total) by mouth daily.     gabapentin  (NEURONTIN ) 100 MG capsule Take 1 capsule (100 mg total) by mouth 3 (three) times daily. 90 capsule 0   isosorbide  mononitrate (IMDUR ) 30 MG 24 hr tablet Take 30 mg by mouth daily.     metoprolol  succinate (TOPROL -XL) 50 MG 24 hr tablet Take 1 tablet (50 mg total) by mouth daily.     potassium chloride  (KLOR-CON ) 10 MEQ tablet Take 10 mEq by mouth daily.     rosuvastatin  (CRESTOR ) 10 MG tablet Take 10 mg by mouth daily. Pt dr instructed to start taking 1/2 tablet tomorrow 9/15     spironolactone  (ALDACTONE ) 25 MG tablet Take 1 tablet (25 mg total) by mouth daily. 90 tablet 3   traZODone  (DESYREL ) 50 MG tablet Take 1 tablet (50 mg total) by mouth at bedtime. 30 tablet 0   No current facility-administered medications for this visit.    Allergies  Allergen Reactions   Entresto  [Sacubitril -Valsartan ] Cough   Jardiance  [Empagliflozin ] Diarrhea      Social History   Socioeconomic History   Marital status: Single    Spouse name: Not on file   Number  of children: Not on file   Years of education: Not on file   Highest education level: Not on file  Occupational History   Occupation: unemployed  Tobacco Use   Smoking status: Former    Current packs/day: 0.00    Types: Cigarettes    Quit date: 10/24/2020    Years since quitting: 3.4   Smokeless tobacco: Never  Vaping Use   Vaping status: Never Used  Substance and Sexual Activity   Alcohol use: No    Alcohol/week: 0.0 standard drinks of  alcohol   Drug use: Not Currently    Comment: percocet   Sexual activity: Not Currently  Other Topics Concern   Not on file  Social History Narrative   Not on file   Social Drivers of Health   Financial Resource Strain: Low Risk  (09/12/2022)   Overall Financial Resource Strain (CARDIA)    Difficulty of Paying Living Expenses: Not very hard  Food Insecurity: No Food Insecurity (09/12/2022)   Hunger Vital Sign    Worried About Running Out of Food in the Last Year: Never true    Ran Out of Food in the Last Year: Never true  Transportation Needs: No Transportation Needs (09/12/2022)   PRAPARE - Administrator, Civil Service (Medical): No    Lack of Transportation (Non-Medical): No  Physical Activity: Sufficiently Active (09/12/2022)   Exercise Vital Sign    Days of Exercise per Week: 7 days    Minutes of Exercise per Session: 30 min  Stress: No Stress Concern Present (09/12/2022)   [employer] of Occupational Health - Occupational Stress Questionnaire    Feeling of Stress : Not at all  Social Connections: Moderately Integrated (09/12/2022)   Social Connection and Isolation Panel    Frequency of Communication with Friends and Family: More than three times a week    Frequency of Social Gatherings with Friends and Family: Once a week    Attends Religious Services: More than 4 times per year    Active Member of [employer] or Organizations: No    Attends Banker Meetings: Never    Marital Status: Living with partner   Intimate Partner Violence: Not At Risk (09/12/2022)   Humiliation, Afraid, Rape, and Kick questionnaire    Fear of Current or Ex-Partner: No    Emotionally Abused: No    Physically Abused: No    Sexually Abused: No      Family History  Problem Relation Age of Onset   Hypertension Mother    Other Mother        covid pneumonia   Congestive Heart Failure Mother    Stroke Father    Heart attack Father 41   Diabetes Father    Hypertension Father    Diabetes Mellitus II Brother    Alzheimer's disease Maternal Grandmother    Heart attack Maternal Grandfather    Cancer Paternal Grandmother    Stroke Paternal Grandfather     There were no vitals filed for this visit.   PHYSICAL EXAM: General:  Well appearing. No respiratory difficulty HEENT: normal Neck: supple. no JVD. Carotids 2+ bilat; no bruits. No lymphadenopathy or thyromegaly appreciated. Cor: PMI nondisplaced. Regular rate & rhythm. No rubs, gallops or murmurs. Lungs: clear Abdomen: soft, nontender, nondistended. No hepatosplenomegaly. No bruits or masses. Good bowel sounds. Extremities: no cyanosis, clubbing, rash, edema Neuro: alert & oriented x 3, cranial nerves grossly intact. moves all 4 extremities w/o difficulty. Affect pleasant.  ECG:   ASSESSMENT & PLAN:  1: Chronic heart failure with reduced ejection fraction- - suspect due to HTN/ CAD - NYHA class II - euvolemic in appearance  - weighing daily & he says that this weight gain has been gradual; Reminded to call for an overnight weight gain of >2 pounds or a weekly weight gain of >5 pounds.  - weight up 10 pounds since he was last here 6 months ago - Echo 02/15/22: EF of 35-40% along with mild Brian.  - Echo report from 10/22/20 reviewed and showed an  EF of 25-30% along with mild Brian/AR.  - Echo report from 05/24/2019 reviewed and showed an EF of 30-35% along with trivial TR/PR.  - Echo done 10/15/15 and showed an EF of 35% along with moderate Brian/TR and an  elevated PA pressure of 48 mm Hg.  - continue metoprolol  succinate 25mg  daily - continue spironolactone  25mg  daily - continue furosemide  40mg  daily/ 10meq daily - unable to tolerate entresto  or jardiance ; tolerated farxiga  but wasn't approved for patient assistance and he can't afford to take it - has been adding a little salt and admits that he could be doing better with monitoring it; encouraged to not add any salt to his food - wearing compression socks daily - BNP 12/14/21 was 28.0  2: HTN- - BP 122/75 - saw provider at [facility] 05/24 - BMP from 09/06/22 showed sodium 139, potassium 3.7, creatinine 1.03 and GFR >60  3: CAD- - saw cardiology Melva) 09/23 - continue atorvastatin  80mg  - LHC done 02/15/22:    RPAV-2 lesion is 90% stenosed.   RPDA lesion is 75% stenosed.   2nd RPL lesion is 80% stenosed.   1st Mrg lesion is 90% stenosed.   1st Diag lesion is 90% stenosed.   2nd Diag lesion is 90% stenosed.   Mid LAD-1 lesion is 10% stenosed.   Mid LAD-2 lesion is 50% stenosed.   RPAV-1 lesion is 90% stenosed.   There is moderate to severe left ventricular systolic dysfunction.   The left ventricular ejection fraction is 25-35% by visual estimate.  1.  NSTEMI 2.  Three-vessel coronary artery disease 90% stenosis D1, 90% stenosis D2, 90% stenosis OM1, 90% stenosis RPAV 1 and 2, 75% stenosis RPDA, without obvious culprit vessel, not ideal for percutaneous revascularization or surgical revascularization 3.  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   Return in 6 months, sooner if needed.    Ellouise DELENA Class, FNP 03/18/24

## 2024-03-19 ENCOUNTER — Encounter: Payer: Self-pay | Admitting: Family

## 2024-03-19 ENCOUNTER — Ambulatory Visit: Payer: MEDICAID | Attending: Family | Admitting: Family

## 2024-03-19 VITALS — BP 114/73 | HR 88 | Wt 242.0 lb

## 2024-03-19 DIAGNOSIS — I480 Paroxysmal atrial fibrillation: Secondary | ICD-10-CM

## 2024-03-19 DIAGNOSIS — Z7901 Long term (current) use of anticoagulants: Secondary | ICD-10-CM | POA: Diagnosis not present

## 2024-03-19 DIAGNOSIS — I252 Old myocardial infarction: Secondary | ICD-10-CM | POA: Insufficient documentation

## 2024-03-19 DIAGNOSIS — I10 Essential (primary) hypertension: Secondary | ICD-10-CM | POA: Diagnosis not present

## 2024-03-19 DIAGNOSIS — I2510 Atherosclerotic heart disease of native coronary artery without angina pectoris: Secondary | ICD-10-CM | POA: Diagnosis not present

## 2024-03-19 DIAGNOSIS — I5022 Chronic systolic (congestive) heart failure: Secondary | ICD-10-CM | POA: Insufficient documentation

## 2024-03-19 DIAGNOSIS — J449 Chronic obstructive pulmonary disease, unspecified: Secondary | ICD-10-CM | POA: Diagnosis not present

## 2024-03-19 DIAGNOSIS — I110 Hypertensive heart disease with heart failure: Secondary | ICD-10-CM | POA: Insufficient documentation

## 2024-03-19 DIAGNOSIS — Z7982 Long term (current) use of aspirin: Secondary | ICD-10-CM | POA: Diagnosis not present

## 2024-03-19 DIAGNOSIS — I4891 Unspecified atrial fibrillation: Secondary | ICD-10-CM | POA: Diagnosis not present

## 2024-03-19 DIAGNOSIS — R0602 Shortness of breath: Secondary | ICD-10-CM | POA: Diagnosis present

## 2024-03-19 DIAGNOSIS — Z87891 Personal history of nicotine dependence: Secondary | ICD-10-CM | POA: Diagnosis not present

## 2024-03-19 DIAGNOSIS — G4733 Obstructive sleep apnea (adult) (pediatric): Secondary | ICD-10-CM | POA: Diagnosis not present

## 2024-03-19 MED ORDER — FUROSEMIDE 40 MG PO TABS: 60.0000 mg | ORAL_TABLET | Freq: Every day | ORAL | 3 refills | Status: AC

## 2024-03-19 NOTE — Progress Notes (Signed)
 ReDS Vest / Clip - 03/19/24 1056       ReDS Vest / Clip   Station Marker D    Ruler Value 32    ReDS Value Range Moderate volume overload    ReDS Actual Value 38

## 2024-03-19 NOTE — Patient Instructions (Addendum)
 It was good to see you today!  Medication Changes:  INCREASE Furosemide  60mg  (1.5 tab) daily  Follow-Up in: Please follow up with the [facility] in 1 month with Ellouise Class, FNP.   Thank you for choosing [facility].    At the [facility], you and your health needs are our priority. We have a designated team specialized in the treatment of Heart Failure. This Care Team includes your primary Heart Failure Specialized Cardiologist (physician), Advanced Practice Providers (APPs- Physician Assistants and Nurse Practitioners), and Pharmacist who all work together to provide you with the care you need, when you need it.   You may see any of the following providers on your designated Care Team at your next follow up:  Dr. Toribio Fuel Dr. Ezra Shuck Dr. Ria Commander Dr. Morene Brownie Ellouise Class, FNP Jaun Bash, RPH-CPP  Please be sure to bring in all your medications bottles to every appointment.   Need to Contact Us :  If you have any questions or concerns before your next appointment please send us  a message through [place] or call our office at [phone].    TO LEAVE A MESSAGE FOR THE NURSE SELECT OPTION 2, PLEASE LEAVE A MESSAGE INCLUDING: YOUR NAME DATE OF BIRTH CALL BACK NUMBER REASON FOR CALL**this is important as we prioritize the call backs  YOU WILL RECEIVE A CALL BACK THE SAME DAY AS LONG AS YOU CALL BEFORE 4:00 PM

## 2024-04-15 ENCOUNTER — Other Ambulatory Visit: Payer: Self-pay | Admitting: Student in an Organized Health Care Education/Training Program

## 2024-04-21 ENCOUNTER — Other Ambulatory Visit: Payer: Self-pay | Admitting: Emergency Medicine

## 2024-04-21 DIAGNOSIS — G4733 Obstructive sleep apnea (adult) (pediatric): Secondary | ICD-10-CM

## 2024-04-21 DIAGNOSIS — Z122 Encounter for screening for malignant neoplasm of respiratory organs: Secondary | ICD-10-CM

## 2024-04-21 DIAGNOSIS — Z87891 Personal history of nicotine dependence: Secondary | ICD-10-CM

## 2024-04-23 ENCOUNTER — Encounter: Payer: MEDICAID | Admitting: Family

## 2024-04-30 ENCOUNTER — Ambulatory Visit
Admission: RE | Admit: 2024-04-30 | Discharge: 2024-04-30 | Disposition: A | Discharge status: Home / Self Care | Payer: MEDICAID | Source: Ambulatory Visit | Attending: Emergency Medicine | Admitting: Emergency Medicine

## 2024-04-30 ENCOUNTER — Other Ambulatory Visit: Payer: Self-pay | Admitting: Student in an Organized Health Care Education/Training Program

## 2024-04-30 DIAGNOSIS — G4733 Obstructive sleep apnea (adult) (pediatric): Secondary | ICD-10-CM | POA: Insufficient documentation

## 2024-04-30 DIAGNOSIS — Z122 Encounter for screening for malignant neoplasm of respiratory organs: Secondary | ICD-10-CM | POA: Insufficient documentation

## 2024-04-30 DIAGNOSIS — Z87891 Personal history of nicotine dependence: Secondary | ICD-10-CM | POA: Diagnosis present

## 2024-05-04 ENCOUNTER — Ambulatory Visit: Payer: MEDICAID

## 2024-05-13 ENCOUNTER — Telehealth: Payer: Self-pay | Admitting: Family

## 2024-05-13 NOTE — Telephone Encounter (Signed)
 Called to confirm/remind patient of their appointment at the [facility] on 05/14/24.   Appointment:   [] Confirmed  [x] Left mess   [] No answer/No voice mail  [] VM Full/unable to leave message  [] Phone not in service  Patient reminded to bring all medications and/or complete list.  Confirmed patient has transportation. Gave directions, instructed to utilize valet parking.

## 2024-05-13 NOTE — Progress Notes (Deleted)
 Advanced [facility] Failure Clinic Note    PCP: [facility]  Primary Cardiologist: Florencio Kava MD / [place], [place], [place] (last seen 08/25; returns 09/25)  Chief Complaint: shortness of breath   HPI:  Brian Porter is a 58 y/o male with a history of AF (01/25), obstructive sleep apnea (without CPAP), CAD, COPD, HTN, GERD, MI, current tobacco use and chronic heart failure.   Echo done 10/15/15 and showed an EF of 35% along with moderate Brian/TR and an elevated PA pressure of 48 mm Hg.  Echo report from 05/24/2019 reviewed and showed an EF of 30-35% along with trivial TR/PR.  Echo report from 10/22/20 reviewed and showed an EF of 25-30% along with mild Brian/AR Echo 02/15/22: EF of 35-40% along with mild Brian.   LHC done 02/15/22:    RPAV-2 lesion is 90% stenosed.   RPDA lesion is 75% stenosed.   2nd RPL lesion is 80% stenosed.   1st Mrg lesion is 90% stenosed.   1st Diag lesion is 90% stenosed.   2nd Diag lesion is 90% stenosed.   Mid LAD-1 lesion is 10% stenosed.   Mid LAD-2 lesion is 50% stenosed.   RPAV-1 lesion is 90% stenosed.   There is moderate to severe left ventricular systolic dysfunction.   The left ventricular ejection fraction is 25-35% by visual estimate.  1.  NSTEMI 2.  Three-vessel coronary artery disease 90% stenosis D1, 90% stenosis D2, 90% stenosis OM1, 90% stenosis RPAV 1 and 2, 75% stenosis RPDA, without obvious culprit vessel, not ideal for percutaneous revascularization or surgical revascularization 3.  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 Recommended treatment for cardiomyopathy.   Was in the ED 09/06/22 due to cough and right sided chest pain thought to be due to COPD exacerbation.   Was in the ED 07/15/23 with chest discomfort/ SOB. Found to be tachycardic with HR 145. Given diltiazem  dose IV  with slowing of HR to 120. EKG confirmed AF RVR and diltiazem  drip started. Returned back to NSR.  Admitted 02/17/24 with acute onset left parasternal chest pain with worsening lower extremity edema. HR found to be 148 and EKG showed AF RVR. BNP 320. CXR showed pulmonary vascular congestion. IV diuresed and converted to NSR the next morning. Echo 02/18/24: EF 30-35%, normal RV, moderate LAE, moderate Brian, borderline dilatation of the aortic root, measuring 39 mm    Was in the ED 03/14/24 with chest pain and AF. Had been taking metoprolol  25mg  daily instead of 50mg  because his BP was low. Found to be in AF RVR without ST elevation on EKG. IV diltiazem  bolus given. Weaned off cardizem . Chest pain resolved after he converted back to NSR.   He presents today for a HF follow-up visit with a chief complaint of shortness of breath. Has associated fatigue, occasional chest pressure with AF episodes, intermittent dizziness. Sleeping ok on 2 pillows. Currently wearing zio monitor until next week from cardiology. Has been taking 40mg  furosemide  daily instead of 60mg  as he didn't realize he was supposed to be taking 60mg  daily.   ROS: All systems negative except what is listed in HPI, PMH and Problem List   Past Medical History:  Diagnosis Date   Acute on chronic systolic heart failure (HCC) 08/27/2016   CAD (coronary artery disease)    CHF (congestive heart failure) (HCC)    CVA (  cerebral vascular accident) (HCC) 10/21/2020   GERD (gastroesophageal reflux disease)    Hypertension    MI, old    NSTEMI (non-ST elevated myocardial infarction) (HCC) 02/14/2022   Sleep apnea    Tobacco abuse 11/02/2015    Current Outpatient Medications  Medication Sig Dispense Refill   acetaminophen  (TYLENOL ) 650 MG CR tablet Take 650 mg by mouth every 8 (eight) hours as needed for pain.     albuterol  (VENTOLIN  HFA) 108 (90 Base) MCG/ACT inhaler Inhale 2 puffs into the lungs every 6 (six) hours as needed for wheezing or  shortness of breath. 8 g 2   amiodarone  (PACERONE ) 200 MG tablet Take 2 tablets (400 mg total) by mouth 2 (two) times daily for 7 days, THEN 1 tablet (200 mg total) daily. 58 tablet 0   apixaban  (ELIQUIS ) 5 MG TABS tablet Take 1 tablet (5 mg total) by mouth 2 (two) times daily. 180 tablet 0   aspirin  81 MG EC tablet Take 1 tablet (81 mg total) by mouth daily. 30 tablet 2   carbamide peroxide (DEBROX) 6.5 % OTIC solution Place 5 drops into both ears 2 (two) times daily. 15 mL 0   cyclobenzaprine  (FLEXERIL ) 5 MG tablet Take 5 mg by mouth 3 (three) times daily as needed.     furosemide  (LASIX ) 40 MG tablet Take 1.5 tablets (60 mg total) by mouth daily. 45 tablet 3   gabapentin  (NEURONTIN ) 100 MG capsule Take 1 capsule (100 mg total) by mouth 3 (three) times daily. 90 capsule 0   isosorbide  mononitrate (IMDUR ) 30 MG 24 hr tablet Take 30 mg by mouth daily.     metoprolol  succinate (TOPROL -XL) 50 MG 24 hr tablet Take 1 tablet (50 mg total) by mouth daily.     potassium chloride  (KLOR-CON ) 10 MEQ tablet Take 10 mEq by mouth daily.     rosuvastatin  (CRESTOR ) 10 MG tablet Take 10 mg by mouth daily. Pt dr instructed to start taking 1/2 tablet tomorrow 9/15     spironolactone  (ALDACTONE ) 25 MG tablet Take 1 tablet (25 mg total) by mouth daily. 90 tablet 3   traZODone  (DESYREL ) 50 MG tablet Take 1 tablet (50 mg total) by mouth at bedtime. 30 tablet 0   No current facility-administered medications for this visit.    Allergies  Allergen Reactions   Entresto  [Sacubitril -Valsartan ] Cough   Jardiance  [Empagliflozin ] Diarrhea      Social History   Socioeconomic History   Marital status: Single    Spouse name: Not on file   Number of children: Not on file   Years of education: Not on file   Highest education level: Not on file  Occupational History   Occupation: unemployed  Tobacco Use   Smoking status: Former    Current packs/day: 0.00    Types: Cigarettes    Quit date: 10/24/2020    Years since  quitting: 3.5   Smokeless tobacco: Never  Vaping Use   Vaping status: Never Used  Substance and Sexual Activity   Alcohol use: No    Alcohol/week: 0.0 standard drinks of alcohol   Drug use: Not Currently    Comment: percocet   Sexual activity: Not Currently  Other Topics Concern   Not on file  Social History Narrative   Not on file   Social Drivers of Health   Financial Resource Strain: Medium Risk (05/04/2024)   Received from [facility]   Overall Financial Resource Strain (CARDIA)    Difficulty of Paying Living  Expenses: Somewhat hard  Food Insecurity: Food Insecurity Present (05/04/2024)   Received from [facility]   Hunger Vital Sign    Within the past 12 months, you worried that your food would run out before you got the money to buy more.: Sometimes true    Within the past 12 months, the food you bought just didn't last and you didn't have money to get more.: Sometimes true  Transportation Needs: No Transportation Needs (05/04/2024)   Received from [facility] - Transportation    In the past 12 months, has lack of transportation kept you from medical appointments or from getting medications?: No    Lack of Transportation (Non-Medical): No  Physical Activity: Sufficiently Active (09/12/2022)   Exercise Vital Sign    Days of Exercise per Week: 7 days    Minutes of Exercise per Session: 30 min  Stress: No Stress Concern Present (09/12/2022)   [employer] - Occupational Stress Questionnaire    Feeling of Stress : Not at all  Social Connections: Moderately Integrated (09/12/2022)   Social Connection and Isolation Panel    Frequency of Communication with Friends and Family: More than three times a week    Frequency of Social Gatherings with Friends and Family: Once a week    Attends Religious Services: More than 4 times per year    Active Member of [employer] or Organizations: No    Attends  Banker Meetings: Never    Marital Status: Living with partner  Intimate Partner Violence: Not At Risk (09/12/2022)   Humiliation, Afraid, Rape, and Kick questionnaire    Fear of Current or Ex-Partner: No    Emotionally Abused: No    Physically Abused: No    Sexually Abused: No      Family History  Problem Relation Age of Onset   Hypertension Mother    Other Mother        covid pneumonia   Congestive Heart Failure Mother    Stroke Father    Heart attack Father 6   Diabetes Father    Hypertension Father    Diabetes Mellitus II Brother    Alzheimer's disease Maternal Grandmother    Heart attack Maternal Grandfather    Cancer Paternal Grandmother    Stroke Paternal Grandfather    There were no vitals filed for this visit.  Wt Readings from Last 3 Encounters:  03/19/24 242 lb (109.8 kg)  03/14/24 238 lb (108 kg)  02/17/24 250 lb (113.4 kg)   Lab Results  Component Value Date   CREATININE 0.96 03/15/2024   CREATININE 1.15 03/14/2024   CREATININE 0.86 02/18/2024   PHYSICAL EXAM:  General: Well appearing.  Cor: No JVD. Regular rhythm, rate.  Lungs: clear Abdomen: soft, nontender, nondistended. Extremities: 1+ pitting edema bilateral lower legs Neuro:. Affect pleasant   ECG: not done  ReDs reading: 38 %, abnormal (see plan below)   ASSESSMENT & PLAN:  1: Chronic heart failure with reduced ejection fraction- - suspect due to HTN/ CAD - NYHA class II - mildly fluid up with pedal edema and elevated ReDs reading - ReDs 38% - weight down 10 pounds since he was last here >1 year ago - Echo done 10/15/15 and showed an EF of 35% along with moderate Brian/TR and an elevated PA pressure of 48 mm Hg.  - Echo 05/24/2019 reviewed and showed an EF of 30-35% along with trivial TR/PR.  - Echo 10/22/20 reviewed and  showed an EF of 25-30% along with mild Brian/AR. - Echo 02/15/22: EF of 35-40% along with mild Brian.  - Echo 02/18/24: EF 30-35%, normal RV, moderate LAE,  moderate Brian, borderline dilatation of the aortic root, measuring 39 mm  - increase furosemide  to 60mg  daily/ continue potassium 10meq daily - continue metoprolol  succinate 50mg  daily - continue spironolactone  25mg  daily - unable to tolerate entresto  or jardiance ; tolerated farxiga  but wasn't approved for patient assistance and he can't afford to take it - has been adding a little salt and admits that he could be doing better with monitoring it; encouraged to not add any salt to his food - wearing compression socks daily - BNP 02/17/24 was 320.5  2: HTN- - BP 114/73 - BMP 03/15/24 reviewed: sodium 140, potassium 4.0, creatinine 0.96 and GFR >60 - BMET today  3: CAD- - saw cardiology Alpheus) 08/25 - continue rosuvastatin  10mg  daily - LDL 07/16/23 was 122 - LHC done 02/15/22:    RPAV-2 lesion is 90% stenosed.   RPDA lesion is 75% stenosed.   2nd RPL lesion is 80% stenosed.   1st Mrg lesion is 90% stenosed.   1st Diag lesion is 90% stenosed.   2nd Diag lesion is 90% stenosed.   Mid LAD-1 lesion is 10% stenosed.   Mid LAD-2 lesion is 50% stenosed.   RPAV-1 lesion is 90% stenosed.   There is moderate to severe left ventricular systolic dysfunction.   The left ventricular ejection fraction is 25-35% by visual estimate.  1.  NSTEMI 2.  Three-vessel coronary artery disease 90% stenosis D1, 90% stenosis D2, 90% stenosis OM1, 90% stenosis RPAV 1 and 2, 75% stenosis RPDA, without obvious culprit vessel, not ideal for percutaneous revascularization or surgical revascularization 3.  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   4: Atrial fibrillation- - continue amiodarone  400mg  BID X 7 days, then decrease to 200mg  daily - continue apixaban  5mg  BID - HR regular today - currently wearing zio monitor   Return in 1 month, sooner if  needed.   I spent 40 minutes reviewing records, interviewing/ examing patient and managing plan/ orders.    Ellouise DELENA Class, FNP 05/13/24

## 2024-05-14 ENCOUNTER — Ambulatory Visit: Payer: MEDICAID | Admitting: Family

## 2024-05-20 ENCOUNTER — Other Ambulatory Visit: Payer: Self-pay

## 2024-05-20 MED ORDER — POTASSIUM CHLORIDE ER 10 MEQ PO TBCR: 10.0000 meq | EXTENDED_RELEASE_TABLET | Freq: Every day | ORAL | 1 refills | Status: AC

## 2024-05-24 ENCOUNTER — Telehealth: Payer: Self-pay | Admitting: Family

## 2024-05-24 NOTE — Progress Notes (Unsigned)
 Advanced Heart Failure Clinic Note    PCP: [facility]  Primary Cardiologist: Dewane Shiner, MD (seen 10/25)  Chief Complaint: fatigue   HPI:  Brian Porter is a 58 y/o male with a history of AF (01/25), obstructive sleep apnea (without CPAP), CAD, COPD, HTN, GERD, MI, current tobacco use and chronic heart failure.   Echo done 10/15/15 and showed an EF of 35% along with moderate Brian/TR and an elevated PA pressure of 48 mm Hg.  Echo report from 05/24/2019 reviewed and showed an EF of 30-35% along with trivial TR/PR.  Echo report from 10/22/20 reviewed and showed an EF of 25-30% along with mild Brian/AR Echo 02/15/22: EF of 35-40% along with mild Brian.   LHC done 02/15/22:    RPAV-2 lesion is 90% stenosed.   RPDA lesion is 75% stenosed.   2nd RPL lesion is 80% stenosed.   1st Mrg lesion is 90% stenosed.   1st Diag lesion is 90% stenosed.   2nd Diag lesion is 90% stenosed.   Mid LAD-1 lesion is 10% stenosed.   Mid LAD-2 lesion is 50% stenosed.   RPAV-1 lesion is 90% stenosed.   There is moderate to severe left ventricular systolic dysfunction.   The left ventricular ejection fraction is 25-35% by visual estimate.  1.  NSTEMI 2.  Three-vessel coronary artery disease 90% stenosis D1, 90% stenosis D2, 90% stenosis OM1, 90% stenosis RPAV 1 and 2, 75% stenosis RPDA, without obvious culprit vessel, not ideal for percutaneous revascularization or surgical revascularization 3.  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 Recommended treatment for cardiomyopathy.   Was in the ED 09/06/22 due to cough and right sided chest pain thought to be due to COPD exacerbation.   Was in the ED 07/15/23 with chest discomfort/ SOB. Found to be tachycardic with HR 145. Given diltiazem  dose IV with slowing of HR to 120. EKG confirmed AF RVR and  diltiazem  drip started. Returned back to NSR.  Admitted 02/17/24 with acute onset left parasternal chest pain with worsening lower extremity edema. HR found to be 148 and EKG showed AF RVR. BNP 320. CXR showed pulmonary vascular congestion. IV diuresed and converted to NSR the next morning. Echo 02/18/24: EF 30-35%, normal RV, moderate LAE, moderate Brian, borderline dilatation of the aortic root, measuring 39 mm    Was in the ED 03/14/24 with chest pain and AF. Had been taking metoprolol  25mg  daily instead of 50mg  because his BP was low. Found to be in AF RVR without ST elevation on EKG. IV diltiazem  bolus given. Weaned off cardizem . Chest pain resolved after he converted back to NSR.   He presents today for a HF follow-up visit with a chief complaint of minimal fatigue. Has associated shortness of breath, occasional chest pressure/ pain, occasional dizziness. Recent saw [facility] and they are going to take a watchful approach. Has upcoming PCP appointment at [facility]. Overall, he reports feeling quite well.                ROS: All systems negative except what is listed in HPI, PMH and Problem List   Past Medical History:  Diagnosis Date   Acute on chronic systolic heart failure (HCC) 08/27/2016   CAD (coronary artery disease)    CHF (congestive heart failure) (HCC)    CVA (cerebral vascular accident) (HCC) 10/21/2020  GERD (gastroesophageal reflux disease)    Hypertension    MI, old    NSTEMI (non-ST elevated myocardial infarction) (HCC) 02/14/2022   Sleep apnea    Tobacco abuse 11/02/2015    Current Outpatient Medications  Medication Sig Dispense Refill   acetaminophen  (TYLENOL ) 650 MG CR tablet Take 650 mg by mouth every 8 (eight) hours as needed for pain.     albuterol  (VENTOLIN  HFA) 108 (90 Base) MCG/ACT inhaler Inhale 2 puffs into the lungs every 6 (six) hours as needed for wheezing or shortness of breath. 8 g 2   amiodarone  (PACERONE ) 200 MG tablet Take 2 tablets (400 mg total)  by mouth 2 (two) times daily for 7 days, THEN 1 tablet (200 mg total) daily. 58 tablet 0   apixaban  (ELIQUIS ) 5 MG TABS tablet Take 1 tablet (5 mg total) by mouth 2 (two) times daily. 180 tablet 0   aspirin  81 MG EC tablet Take 1 tablet (81 mg total) by mouth daily. 30 tablet 2   carbamide peroxide (DEBROX) 6.5 % OTIC solution Place 5 drops into both ears 2 (two) times daily. 15 mL 0   cyclobenzaprine  (FLEXERIL ) 5 MG tablet Take 5 mg by mouth 3 (three) times daily as needed.     furosemide  (LASIX ) 40 MG tablet Take 1.5 tablets (60 mg total) by mouth daily. 45 tablet 3   gabapentin  (NEURONTIN ) 100 MG capsule Take 1 capsule (100 mg total) by mouth 3 (three) times daily. 90 capsule 0   isosorbide  mononitrate (IMDUR ) 30 MG 24 hr tablet Take 30 mg by mouth daily.     metoprolol  succinate (TOPROL -XL) 50 MG 24 hr tablet Take 1 tablet (50 mg total) by mouth daily.     potassium chloride  (KLOR-CON ) 10 MEQ tablet Take 1 tablet (10 mEq total) by mouth daily. 90 tablet 1   rosuvastatin  (CRESTOR ) 10 MG tablet Take 10 mg by mouth daily. Pt dr instructed to start taking 1/2 tablet tomorrow 9/15     spironolactone  (ALDACTONE ) 25 MG tablet Take 1 tablet (25 mg total) by mouth daily. 90 tablet 3   traZODone  (DESYREL ) 50 MG tablet Take 1 tablet (50 mg total) by mouth at bedtime. 30 tablet 0   No current facility-administered medications for this visit.    Allergies  Allergen Reactions   Entresto  [Sacubitril -Valsartan ] Cough   Jardiance  [Empagliflozin ] Diarrhea      Social History   Socioeconomic History   Marital status: Single    Spouse name: Not on file   Number of children: Not on file   Years of education: Not on file   Highest education level: Not on file  Occupational History   Occupation: unemployed  Tobacco Use   Smoking status: Former    Current packs/day: 0.00    Types: Cigarettes    Quit date: 10/24/2020    Years since quitting: 3.5   Smokeless tobacco: Never  Vaping Use   Vaping  status: Never Used  Substance and Sexual Activity   Alcohol use: No    Alcohol/week: 0.0 standard drinks of alcohol   Drug use: Not Currently    Comment: percocet   Sexual activity: Not Currently  Other Topics Concern   Not on file  Social History Narrative   Not on file   Social Drivers of Health   Financial Resource Strain: Medium Risk (05/04/2024)   Received from [facility]   Overall Financial Resource Strain (CARDIA)    Difficulty of Paying Living Expenses: Somewhat hard  Food Insecurity: Food Insecurity Present (05/04/2024)   Received from [facility]   Hunger Vital Sign    Within the past 12 months, you worried that your food would run out before you got the money to buy more.: Sometimes true    Within the past 12 months, the food you bought just didn't last and you didn't have money to get more.: Sometimes true  Transportation Needs: No Transportation Needs (05/04/2024)   Received from [facility] - Transportation    In the past 12 months, has lack of transportation kept you from medical appointments or from getting medications?: No    Lack of Transportation (Non-Medical): No  Physical Activity: Sufficiently Active (09/12/2022)   Exercise Vital Sign    Days of Exercise per Week: 7 days    Minutes of Exercise per Session: 30 min  Stress: No Stress Concern Present (09/12/2022)   [employer] of [employer]ational [employer] - Occupational Stress Questionnaire    Feeling of Stress : Not at all  Social Connections: Moderately Integrated (09/12/2022)   Social Connection and Isolation Panel    Frequency of Communication with Friends and Family: More than three times a week    Frequency of Social Gatherings with Friends and Family: Once a week    Attends Religious Services: More than 4 times per year    Active Member of [employer] or Organizations: No    Attends Banker Meetings: Never    Marital Status: Living  with partner  Intimate Partner Violence: Not At Risk (09/12/2022)   Humiliation, Afraid, Rape, and Kick questionnaire    Fear of Current or Ex-Partner: No    Emotionally Abused: No    Physically Abused: No    Sexually Abused: No      Family History  Problem Relation Age of Onset   Hypertension Mother    Other Mother        covid pneumonia   Congestive Heart Failure Mother    Stroke Father    Heart attack Father 63   Diabetes Father    Hypertension Father    Diabetes Mellitus II Brother    Alzheimer's disease Maternal Grandmother    Heart attack Maternal Grandfather    Cancer Paternal Grandmother    Stroke Paternal Grandfather    Vitals:   05/25/24 1426  BP: 110/69  Pulse: 72  SpO2: 98%  Weight: 243 lb 4 oz (110.3 kg)   Wt Readings from Last 3 Encounters:  05/25/24 243 lb 4 oz (110.3 kg)  03/19/24 242 lb (109.8 kg)  03/14/24 238 lb (108 kg)   Lab Results  Component Value Date   CREATININE 0.96 03/15/2024   CREATININE 1.15 03/14/2024   CREATININE 0.86 02/18/2024    PHYSICAL EXAM:  General: Well appearing.  Cor: No JVD. Regular rhythm, rate.  Lungs: clear Abdomen: soft, nontender, nondistended. Extremities: trace pitting edema bilateral lower legs Neuro:. Affect pleasant   ECG: not done   ASSESSMENT & PLAN:  1: Chronic heart failure with reduced ejection fraction- - suspect due to HTN/ CAD - NYHA class II - euvolemic today - weight stable since he was last here 2 months ago - Echo done 10/15/15 and showed an EF of 35% along with moderate Brian/TR and an elevated PA pressure of 48 mm Hg.  - Echo 05/24/2019 reviewed and showed an EF of 30-35% along with trivial TR/PR.  - Echo 10/22/20 reviewed and showed an EF of 25-30% along with mild  Brian/AR. - Echo 02/15/22: EF of 35-40% along with mild Brian.  - Echo 02/18/24: EF 30-35%, normal RV, moderate LAE, moderate Brian, borderline dilatation of the aortic root, measuring 39 mm  - continue furosemide  60mg  daily/ continue  potassium 10meq daily - continue metoprolol  succinate 50mg  daily - continue spironolactone  25mg  daily - unable to tolerate entresto  or jardiance ; tolerated farxiga  but wasn't approved for patient assistance and he can't afford to take it - encouraged to start wearing compression socks daily - BNP 02/17/24 was 320.5  2: HTN- - BP 110/69 - BMP 03/15/24 reviewed: sodium 140, potassium 4.0, creatinine 0.96 and GFR >60  3: CAD- - saw cardiology Alpheus) 08/25 - continue rosuvastatin  10mg  daily - RX for NTG 0.4mg  SL PRN to have on hand for chest pain. Reviewed instructions on how to use this.  - LDL 07/16/23 was 122 - LHC done 02/15/22:    RPAV-2 lesion is 90% stenosed.   RPDA lesion is 75% stenosed.   2nd RPL lesion is 80% stenosed.   1st Mrg lesion is 90% stenosed.   1st Diag lesion is 90% stenosed.   2nd Diag lesion is 90% stenosed.   Mid LAD-1 lesion is 10% stenosed.   Mid LAD-2 lesion is 50% stenosed.   RPAV-1 lesion is 90% stenosed.   There is moderate to severe left ventricular systolic dysfunction.   The left ventricular ejection fraction is 25-35% by visual estimate.  1.  NSTEMI 2.  Three-vessel coronary artery disease 90% stenosis D1, 90% stenosis D2, 90% stenosis OM1, 90% stenosis RPAV 1 and 2, 75% stenosis RPDA, without obvious culprit vessel, not ideal for percutaneous revascularization or surgical revascularization 3.  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   4: Atrial fibrillation- - continue amiodarone  200mg  daily - continue apixaban  5mg  BID   Return in 6 months, sooner if needed.   I spent 20 minutes reviewing records, interviewing/ examing patient and managing plan/ orders.   Ellouise DELENA Class, FNP 05/24/24

## 2024-05-24 NOTE — Telephone Encounter (Signed)
 Called to confirm/remind patient of their appointment at the [facility] on 05/25/24.   Appointment:   [] Confirmed  [x] Left mess   [] No answer/No voice mail  [] VM Full/unable to leave message  [] Phone not in service  Patient reminded to bring all medications and/or complete list.  Confirmed patient has transportation. Gave directions, instructed to utilize valet parking.

## 2024-05-25 ENCOUNTER — Ambulatory Visit: Payer: MEDICAID | Attending: Family | Admitting: Family

## 2024-05-25 ENCOUNTER — Encounter: Payer: Self-pay | Admitting: Family

## 2024-05-25 VITALS — BP 110/69 | HR 72 | Wt 243.2 lb

## 2024-05-25 DIAGNOSIS — Z560 Unemployment, unspecified: Secondary | ICD-10-CM | POA: Insufficient documentation

## 2024-05-25 DIAGNOSIS — R42 Dizziness and giddiness: Secondary | ICD-10-CM | POA: Diagnosis not present

## 2024-05-25 DIAGNOSIS — I5022 Chronic systolic (congestive) heart failure: Secondary | ICD-10-CM | POA: Insufficient documentation

## 2024-05-25 DIAGNOSIS — I4891 Unspecified atrial fibrillation: Secondary | ICD-10-CM | POA: Insufficient documentation

## 2024-05-25 DIAGNOSIS — Z7901 Long term (current) use of anticoagulants: Secondary | ICD-10-CM | POA: Insufficient documentation

## 2024-05-25 DIAGNOSIS — Z5941 Food insecurity: Secondary | ICD-10-CM | POA: Insufficient documentation

## 2024-05-25 DIAGNOSIS — R5383 Other fatigue: Secondary | ICD-10-CM | POA: Diagnosis not present

## 2024-05-25 DIAGNOSIS — Z79899 Other long term (current) drug therapy: Secondary | ICD-10-CM | POA: Insufficient documentation

## 2024-05-25 DIAGNOSIS — I10 Essential (primary) hypertension: Secondary | ICD-10-CM

## 2024-05-25 DIAGNOSIS — K219 Gastro-esophageal reflux disease without esophagitis: Secondary | ICD-10-CM | POA: Insufficient documentation

## 2024-05-25 DIAGNOSIS — I110 Hypertensive heart disease with heart failure: Secondary | ICD-10-CM | POA: Insufficient documentation

## 2024-05-25 DIAGNOSIS — Z720 Tobacco use: Secondary | ICD-10-CM | POA: Insufficient documentation

## 2024-05-25 DIAGNOSIS — I480 Paroxysmal atrial fibrillation: Secondary | ICD-10-CM

## 2024-05-25 DIAGNOSIS — I2510 Atherosclerotic heart disease of native coronary artery without angina pectoris: Secondary | ICD-10-CM | POA: Insufficient documentation

## 2024-05-25 DIAGNOSIS — R0789 Other chest pain: Secondary | ICD-10-CM | POA: Insufficient documentation

## 2024-05-25 DIAGNOSIS — G4733 Obstructive sleep apnea (adult) (pediatric): Secondary | ICD-10-CM | POA: Diagnosis not present

## 2024-05-25 DIAGNOSIS — I252 Old myocardial infarction: Secondary | ICD-10-CM | POA: Diagnosis not present

## 2024-05-25 DIAGNOSIS — Z59868 Other specified financial insecurity: Secondary | ICD-10-CM | POA: Insufficient documentation

## 2024-05-25 DIAGNOSIS — J449 Chronic obstructive pulmonary disease, unspecified: Secondary | ICD-10-CM | POA: Insufficient documentation

## 2024-05-25 DIAGNOSIS — I420 Dilated cardiomyopathy: Secondary | ICD-10-CM | POA: Insufficient documentation

## 2024-05-25 MED ORDER — AMIODARONE HCL 200 MG PO TABS: 200.0000 mg | ORAL_TABLET | Freq: Every day | ORAL | 3 refills | Status: AC

## 2024-05-25 MED ORDER — APIXABAN 5 MG PO TABS: 5.0000 mg | ORAL_TABLET | Freq: Two times a day (BID) | ORAL | 3 refills | Status: AC

## 2024-05-25 MED ORDER — NITROGLYCERIN 0.4 MG SL SUBL: 0.4000 mg | SUBLINGUAL_TABLET | SUBLINGUAL | 0 refills | Status: AC | PRN

## 2024-05-25 NOTE — Patient Instructions (Signed)
 Medication Changes:  REFILLED Nitroglycerin , Eliquis , and Amiodarone    Follow-Up in: Please follow up with the [facility] in 6 months with Brian Class, FNP.   Thank you for choosing [facility].    At the [facility], you and your health needs are our priority. We have a designated team specialized in the treatment of Heart Failure. This Care Team includes your primary Heart Failure Specialized Cardiologist (physician), Advanced Practice Providers (APPs- Physician Assistants and Nurse Practitioners), and Pharmacist who all work together to provide you with the care you need, when you need it.   You may see any of the following providers on your designated Care Team at your next follow up:  Dr. Toribio Fuel Dr. Ezra Shuck Dr. Ria Commander Dr. Morene Brownie Brian Class, FNP Jaun Bash, RPH-CPP  Please be sure to bring in all your medications bottles to every appointment.   Need to Contact Us :  If you have any questions or concerns before your next appointment please send us  a message through [place] or call our office at [phone].    TO LEAVE A MESSAGE FOR THE NURSE SELECT OPTION 2, PLEASE LEAVE A MESSAGE INCLUDING: YOUR NAME DATE OF BIRTH CALL BACK NUMBER REASON FOR CALL**this is important as we prioritize the call backs  YOU WILL RECEIVE A CALL BACK THE SAME DAY AS LONG AS YOU CALL BEFORE 4:00 PM

## 2024-06-05 ENCOUNTER — Other Ambulatory Visit: Payer: Self-pay | Admitting: Internal Medicine

## 2024-06-21 ENCOUNTER — Encounter: Payer: Self-pay | Admitting: Emergency Medicine

## 2024-06-21 ENCOUNTER — Emergency Department: Payer: MEDICAID

## 2024-06-21 ENCOUNTER — Emergency Department
Admission: EM | Admit: 2024-06-21 | Discharge: 2024-06-21 | Disposition: A | Discharge status: Home / Self Care | Payer: MEDICAID | Attending: Emergency Medicine | Admitting: Emergency Medicine

## 2024-06-21 ENCOUNTER — Other Ambulatory Visit: Payer: Self-pay

## 2024-06-21 DIAGNOSIS — J209 Acute bronchitis, unspecified: Secondary | ICD-10-CM | POA: Insufficient documentation

## 2024-06-21 DIAGNOSIS — J449 Chronic obstructive pulmonary disease, unspecified: Secondary | ICD-10-CM | POA: Insufficient documentation

## 2024-06-21 DIAGNOSIS — R059 Cough, unspecified: Secondary | ICD-10-CM | POA: Diagnosis present

## 2024-06-21 DIAGNOSIS — M25511 Pain in right shoulder: Secondary | ICD-10-CM | POA: Diagnosis not present

## 2024-06-21 LAB — BASIC METABOLIC PANEL WITH GFR
Anion gap: 11 (ref 5–15)
BUN: 21 mg/dL — ABNORMAL HIGH (ref 6–20)
CO2: 27 mmol/L (ref 22–32)
Calcium: 8.8 mg/dL — ABNORMAL LOW (ref 8.9–10.3)
Chloride: 103 mmol/L (ref 98–111)
Creatinine, Ser: 1.28 mg/dL — ABNORMAL HIGH (ref 0.61–1.24)
GFR, Estimated: >60 mL/min
Glucose, Bld: 94 mg/dL (ref 70–99)
Potassium: 4.2 mmol/L (ref 3.5–5.1)
Sodium: 141 mmol/L (ref 135–145)

## 2024-06-21 LAB — RESP PANEL BY RT-PCR (RSV, FLU A&B, COVID)  RVPGX2
Influenza A by PCR: NEGATIVE
Influenza B by PCR: NEGATIVE
Resp Syncytial Virus by PCR: NEGATIVE
SARS Coronavirus 2 by RT PCR: NEGATIVE

## 2024-06-21 LAB — CBC
HCT: 43.4 % (ref 39.0–52.0)
Hemoglobin: 14.2 g/dL (ref 13.0–17.0)
MCH: 31.4 pg (ref 26.0–34.0)
MCHC: 32.7 g/dL (ref 30.0–36.0)
MCV: 96 fL (ref 80.0–100.0)
Platelets: 335 K/uL (ref 150–400)
RBC: 4.52 MIL/uL (ref 4.22–5.81)
RDW: 13.2 % (ref 11.5–15.5)
WBC: 9.7 K/uL (ref 4.0–10.5)
nRBC: 0 % (ref 0.0–0.2)

## 2024-06-21 LAB — TROPONIN T, HIGH SENSITIVITY: Troponin T High Sensitivity: 21 ng/L — ABNORMAL HIGH (ref 0–19)

## 2024-06-21 MED ORDER — PREDNISONE 10 MG (21) PO TBPK: ORAL_TABLET | ORAL | 0 refills | Status: DC

## 2024-06-21 MED ORDER — IPRATROPIUM-ALBUTEROL 0.5-2.5 (3) MG/3ML IN SOLN
3.0000 mL | Freq: Once | RESPIRATORY_TRACT | Status: AC
  Administered 2024-06-21: 3 mL via RESPIRATORY_TRACT
  Filled 2024-06-21: qty 3

## 2024-06-21 MED ORDER — PREDNISONE 20 MG PO TABS
60.0000 mg | ORAL_TABLET | Freq: Once | ORAL | Status: AC
  Administered 2024-06-21: 60 mg via ORAL
  Filled 2024-06-21: qty 3

## 2024-06-21 NOTE — ED Provider Notes (Signed)
 "  [facility] Provider Note    Event Date/Time   First MD Initiated Contact with Patient 06/21/24 1817     (approximate)   History   Cough and Chest Pain   HPI  Brian Porter is a 58 y.o. male who presents to the emergency department today because of concerns for cough, shortness of breath.  Patient states that the symptoms started this morning.  His girlfriend has also been sick.  The patient does have a history of COPD.  States he has nebulizer with saline but is out of albuterol .  He denies any fevers.  No significant chest pain.  Has secondary complaint of some right shoulder pain.  He thinks he might have arthritis.     Physical Exam   Triage Vital Signs: ED Triage Vitals  Encounter Vitals Group     BP 06/21/24 1710 124/77     Girls Systolic BP Percentile --      Girls Diastolic BP Percentile --      Boys Systolic BP Percentile --      Boys Diastolic BP Percentile --      Pulse Rate 06/21/24 1710 74     Resp 06/21/24 1710 18     Temp 06/21/24 1710 98.9 F (37.2 C)     Temp Source 06/21/24 1710 Oral     SpO2 06/21/24 1710 94 %     Weight 06/21/24 1709 234 lb (106.1 kg)     Height 06/21/24 1709 5' 9 (1.753 m)     Head Circumference --      Peak Flow --      Pain Score 06/21/24 1709 6     Pain Loc --      Pain Education --      Exclude from Growth Chart --     Most recent vital signs: Vitals:   06/21/24 1710  BP: 124/77  Pulse: 74  Resp: 18  Temp: 98.9 F (37.2 C)  SpO2: 94%   General: Awake, alert, oriented. CV:  Good peripheral perfusion. Regular rate and rhythm. Resp:  Normal effort. Diffuse expiratory wheezing Abd:  No distention.   ED Results / Procedures / Treatments   Labs (all labs ordered are listed, but only abnormal results are displayed) Labs Reviewed  BASIC METABOLIC PANEL WITH GFR - Abnormal; Notable for the following components:      Result Value   BUN 21 (*)    Creatinine, Ser 1.28 (*)    Calcium  8.8 (*)     All other components within normal limits  TROPONIN T, HIGH SENSITIVITY - Abnormal; Notable for the following components:   Troponin T High Sensitivity 21 (*)    All other components within normal limits  RESP PANEL BY RT-PCR (RSV, FLU A&B, COVID)  RVPGX2  CBC  TROPONIN T, HIGH SENSITIVITY     EKG  I, Guadalupe Eagles, attending physician, personally viewed and interpreted this EKG  EKG Time: 1710 Rate: 74 Rhythm: normal sinus rhythm Axis: left axis deviation Intervals: qtc 477 QRS: LAFB ST changes: no st elevation Impression: abnormal ekg    RADIOLOGY I independently interpreted and visualized the CXR. My interpretation: No pneumonia Radiology interpretation:  IMPRESSION:  Emphysema and chronic bronchial thickening.  No acute findings.      PROCEDURES:  Critical Care performed: No    MEDICATIONS ORDERED IN ED: Medications  ipratropium-albuterol  (DUONEB) 0.5-2.5 (3) MG/3ML nebulizer solution 3 mL (has no administration in time range)  ipratropium-albuterol  (DUONEB) 0.5-2.5 (3)  MG/3ML nebulizer solution 3 mL (3 mLs Nebulization Given 06/21/24 1930)  ipratropium-albuterol  (DUONEB) 0.5-2.5 (3) MG/3ML nebulizer solution 3 mL (3 mLs Nebulization Given 06/21/24 1930)  predniSONE  (DELTASONE ) tablet 60 mg (60 mg Oral Given 06/21/24 1927)     IMPRESSION / MDM / ASSESSMENT AND PLAN / ED COURSE  I reviewed the triage vital signs and the nursing notes.                              Differential diagnosis includes, but is not limited to, pneumonia, viral URI, COPD, bronchitis  Patient's presentation is most consistent with acute presentation with potential threat to life or bodily function.   Patient presented to the emergency department today because of concerns for cough, shortness of breath.  On exam patient no respiratory distress although has diffuse expiratory wheezing.  Chest x-ray without pneumonia.  Blood work shows very minimally elevated troponin.  Per chart  review patient has had history of elevated troponins in the past.  Patient did not want to have troponin checked I think this is reasonable at this time.  He did feel better after breathing treatments and steroids.  Will discharge with further steroids.  In terms of the patient's right shoulder pain, the patient is neurovascularly intact distally in that arm.  Painless range of motion.  Do wonder for arthritis likely.  At this time do not feel any emergent imaging or further workup is necessary.     FINAL CLINICAL IMPRESSION(S) / ED DIAGNOSES   Final diagnoses:  Acute bronchitis, unspecified organism       Note:  This document was prepared using Dragon voice recognition software and may include unintentional dictation errors.    Floy Roberts, MD 06/21/24 2008  "

## 2024-06-21 NOTE — ED Notes (Signed)
 ED provider stated second troponin was not needed if pt did not want to have blood drawn. Pt alert and oriented and stated multiple times he did not need the second troponin drawn after this RN explained what a troponin lab was for.

## 2024-06-21 NOTE — ED Triage Notes (Signed)
 Pt via POV from home. Pt c/o productive cough, chest pain, and SOB that started today. Denies fevers or sick contacts. States he has a hx of COPD. Pt has a hx of a fib and does take Eliquis . Pt is A&Ox4 and NAD

## 2024-06-23 ENCOUNTER — Emergency Department
Admission: EM | Admit: 2024-06-23 | Discharge: 2024-06-23 | Disposition: A | Discharge status: Home / Self Care | Payer: MEDICAID | Attending: Emergency Medicine | Admitting: Emergency Medicine

## 2024-06-23 ENCOUNTER — Other Ambulatory Visit: Payer: Self-pay

## 2024-06-23 DIAGNOSIS — Z8673 Personal history of transient ischemic attack (TIA), and cerebral infarction without residual deficits: Secondary | ICD-10-CM | POA: Diagnosis not present

## 2024-06-23 DIAGNOSIS — I110 Hypertensive heart disease with heart failure: Secondary | ICD-10-CM | POA: Diagnosis not present

## 2024-06-23 DIAGNOSIS — J069 Acute upper respiratory infection, unspecified: Secondary | ICD-10-CM | POA: Diagnosis not present

## 2024-06-23 DIAGNOSIS — I509 Heart failure, unspecified: Secondary | ICD-10-CM | POA: Insufficient documentation

## 2024-06-23 DIAGNOSIS — J449 Chronic obstructive pulmonary disease, unspecified: Secondary | ICD-10-CM | POA: Diagnosis not present

## 2024-06-23 DIAGNOSIS — R059 Cough, unspecified: Secondary | ICD-10-CM | POA: Diagnosis present

## 2024-06-23 MED ORDER — GUAIFENESIN-CODEINE 100-10 MG/5ML PO SOLN: 5.0000 mL | Freq: Three times a day (TID) | ORAL | 0 refills | Status: AC | PRN

## 2024-06-23 MED ORDER — AMOXICILLIN 500 MG PO CAPS: 500.0000 mg | ORAL_CAPSULE | Freq: Three times a day (TID) | ORAL | 0 refills | Status: AC

## 2024-06-23 NOTE — ED Triage Notes (Signed)
 Seen yesterday for same, presents for cough syrup. STates coughed all night and couldn't sleep

## 2024-06-23 NOTE — ED Provider Notes (Signed)
 "  [facility] Provider Note    Event Date/Time   First MD Initiated Contact with Patient 06/23/24 1339     (approximate)   History   Cough   HPI  Brian Porter is a 58 y.o. male history of hypertension, CVA, GERD, NSTEMI, CHF presents emergency department with a cough.  Patient was seen yesterday.  States he did not get cough medicine and did not sleep because he did not have cough medicine.  Does not want another workup would just like a prescription for cough medication.  Patient does have history of COPD.      Physical Exam   Triage Vital Signs: ED Triage Vitals  Encounter Vitals Group     BP 06/23/24 1318 133/70     Girls Systolic BP Percentile --      Girls Diastolic BP Percentile --      Boys Systolic BP Percentile --      Boys Diastolic BP Percentile --      Pulse Rate 06/23/24 1318 80     Resp 06/23/24 1318 16     Temp 06/23/24 1318 97.6 F (36.4 C)     Temp Source 06/23/24 1318 Oral     SpO2 06/23/24 1318 95 %     Weight 06/23/24 1319 234 lb 2.4 oz (106.2 kg)     Height --      Head Circumference --      Peak Flow --      Pain Score 06/23/24 1319 0     Pain Loc --      Pain Education --      Exclude from Growth Chart --     Most recent vital signs: Vitals:   06/23/24 1318  BP: 133/70  Pulse: 80  Resp: 16  Temp: 97.6 F (36.4 C)  SpO2: 95%     General: Awake, no distress.   CV:  Good peripheral perfusion. regular rate and  rhythm Resp:  Normal effort. Lungs CTA Abd:  No distention.   Other:      ED Results / Procedures / Treatments   Labs (all labs ordered are listed, but only abnormal results are displayed) Labs Reviewed - No data to display   EKG     RADIOLOGY     PROCEDURES:   Procedures  Critical Care:  no Chief Complaint  Patient presents with   Cough      MEDICATIONS ORDERED IN ED: Medications - No data to display   IMPRESSION / MDM / ASSESSMENT AND PLAN / ED COURSE  I reviewed  the triage vital signs and the nursing notes.                              Differential diagnosis includes, but is not limited to, COVID, influenza, RSV, CAP, COPD exasperation, bronchitis, URI  Patient's presentation is most consistent with acute illness / injury with system symptoms.   Patient had full workup yesterday.  Workup was reassuring.  At this time patient basically wants cough medication.  Started him on amoxicillin  due to COPD exasperation, and prescription for Bromfed cough syrup.  Did not give him Hycodan or Tussionex secondary to narcotics and possible reaction between these medications and potassium.  He is in agreement with treatment plan.  He is discharged stable condition.  Instructions to return for worsening      FINAL CLINICAL IMPRESSION(S) / ED DIAGNOSES   Final diagnoses:  Acute upper respiratory infection     Rx / DC Orders   ED Discharge Orders          Ordered    amoxicillin  (AMOXIL ) 500 MG capsule  3 times daily        06/23/24 1400    guaiFENesin -codeine  100-10 MG/5ML syrup  3 times daily PRN        06/23/24 1400             Note:  This document was prepared using Dragon voice recognition software and may include unintentional dictation errors.    Gasper Devere ORN, PA-C 06/23/24 1456    Viviann Pastor, MD 06/23/24 [phone]  "

## 2024-06-25 ENCOUNTER — Emergency Department
Admission: EM | Admit: 2024-06-25 | Discharge: 2024-06-25 | Disposition: A | Discharge status: Home / Self Care | Payer: MEDICAID | Attending: Emergency Medicine | Admitting: Emergency Medicine

## 2024-06-25 ENCOUNTER — Emergency Department: Payer: MEDICAID

## 2024-06-25 ENCOUNTER — Encounter: Payer: Self-pay | Admitting: Emergency Medicine

## 2024-06-25 ENCOUNTER — Other Ambulatory Visit: Payer: Self-pay

## 2024-06-25 DIAGNOSIS — J441 Chronic obstructive pulmonary disease with (acute) exacerbation: Secondary | ICD-10-CM | POA: Diagnosis not present

## 2024-06-25 DIAGNOSIS — R059 Cough, unspecified: Secondary | ICD-10-CM | POA: Diagnosis present

## 2024-06-25 DIAGNOSIS — I509 Heart failure, unspecified: Secondary | ICD-10-CM | POA: Diagnosis not present

## 2024-06-25 DIAGNOSIS — Z7901 Long term (current) use of anticoagulants: Secondary | ICD-10-CM | POA: Insufficient documentation

## 2024-06-25 DIAGNOSIS — I4891 Unspecified atrial fibrillation: Secondary | ICD-10-CM | POA: Diagnosis not present

## 2024-06-25 DIAGNOSIS — R051 Acute cough: Secondary | ICD-10-CM

## 2024-06-25 LAB — BASIC METABOLIC PANEL WITH GFR
Anion gap: 10 (ref 5–15)
BUN: 18 mg/dL (ref 6–20)
CO2: 27 mmol/L (ref 22–32)
Calcium: 8.5 mg/dL — ABNORMAL LOW (ref 8.9–10.3)
Chloride: 103 mmol/L (ref 98–111)
Creatinine, Ser: 0.96 mg/dL (ref 0.61–1.24)
GFR, Estimated: >60 mL/min
Glucose, Bld: 233 mg/dL — ABNORMAL HIGH (ref 70–99)
Potassium: 4.3 mmol/L (ref 3.5–5.1)
Sodium: 140 mmol/L (ref 135–145)

## 2024-06-25 LAB — CBC WITH DIFFERENTIAL/PLATELET
Abs Immature Granulocytes: 0.07 K/uL (ref 0.00–0.07)
Basophils Absolute: 0 K/uL (ref 0.0–0.1)
Basophils Relative: 0 %
Eosinophils Absolute: 0 K/uL (ref 0.0–0.5)
Eosinophils Relative: 0 %
HCT: 43.1 % (ref 39.0–52.0)
Hemoglobin: 14.3 g/dL (ref 13.0–17.0)
Immature Granulocytes: 1 %
Lymphocytes Relative: 7 %
Lymphs Abs: 0.9 K/uL (ref 0.7–4.0)
MCH: 31.9 pg (ref 26.0–34.0)
MCHC: 33.2 g/dL (ref 30.0–36.0)
MCV: 96.2 fL (ref 80.0–100.0)
Monocytes Absolute: 2.2 K/uL — ABNORMAL HIGH (ref 0.1–1.0)
Monocytes Relative: 18 %
Neutro Abs: 9.2 K/uL — ABNORMAL HIGH (ref 1.7–7.7)
Neutrophils Relative %: 74 %
Platelets: 332 K/uL (ref 150–400)
RBC: 4.48 MIL/uL (ref 4.22–5.81)
RDW: 13.9 % (ref 11.5–15.5)
WBC: 12.4 K/uL — ABNORMAL HIGH (ref 4.0–10.5)
nRBC: 0 % (ref 0.0–0.2)

## 2024-06-25 LAB — TROPONIN T, HIGH SENSITIVITY: Troponin T High Sensitivity: <15 ng/L (ref 0–19)

## 2024-06-25 MED ORDER — METHYLPREDNISOLONE SODIUM SUCC 125 MG IJ SOLR
125.0000 mg | Freq: Once | INTRAMUSCULAR | Status: AC
  Administered 2024-06-25: 125 mg via INTRAVENOUS
  Filled 2024-06-25: qty 2

## 2024-06-25 MED ORDER — IPRATROPIUM-ALBUTEROL 0.5-2.5 (3) MG/3ML IN SOLN
3.0000 mL | Freq: Once | RESPIRATORY_TRACT | Status: AC
  Administered 2024-06-25: 3 mL via RESPIRATORY_TRACT
  Filled 2024-06-25: qty 3

## 2024-06-25 MED ORDER — AZITHROMYCIN 250 MG PO TABS: ORAL_TABLET | ORAL | 0 refills | Status: AC

## 2024-06-25 MED ORDER — AMOXICILLIN-POT CLAVULANATE 875-125 MG PO TABS: 1.0000 | ORAL_TABLET | Freq: Two times a day (BID) | ORAL | 0 refills | Status: AC

## 2024-06-25 MED ORDER — PREDNISONE 20 MG PO TABS: 40.0000 mg | ORAL_TABLET | Freq: Every day | ORAL | 0 refills | Status: AC

## 2024-06-25 NOTE — ED Provider Notes (Addendum)
 "  [facility] Provider Note    Event Date/Time   First MD Initiated Contact with Patient 06/25/24 [phone]     (approximate)   History   Cough   HPI  Brian Porter is a 58 y.o. male with history of COPD, CHF, A-fib on Eliquis  who comes in with concerns for a cough.  Patient reports a continued cough for the past 4 days.  He reports that he started the steroids and antibiotics about 3 days ago but no relief in symptoms.  He reports that but stated that he has difficulty sleeping at night secondary to the cough.  He comes in again today just for continued coughing.  He denies any overt chest pain, shortness of breath or other concerns.  Patient does report compliance with his Eliquis .   Reviewed the records and on 04/20/2024 patient under went a CT without contrast scanning CT for his emphysema but did not have any evidence of suspicious pulmonary nodules or masses  Physical Exam   Triage Vital Signs: ED Triage Vitals  Encounter Vitals Group     BP 06/25/24 0801 132/69     Girls Systolic BP Percentile --      Girls Diastolic BP Percentile --      Boys Systolic BP Percentile --      Boys Diastolic BP Percentile --      Pulse Rate 06/25/24 0801 77     Resp 06/25/24 0801 19     Temp 06/25/24 0801 (!) 97.3 F (36.3 C)     Temp Source 06/25/24 0801 Oral     SpO2 06/25/24 0801 93 %     Weight --      Height --      Head Circumference --      Peak Flow --      Pain Score 06/25/24 0759 0     Pain Loc --      Pain Education --      Exclude from Growth Chart --     Most recent vital signs: Vitals:   06/25/24 0801 06/25/24 0827  BP: 132/69   Pulse: 77   Resp: 19   Temp: (!) 97.3 F (36.3 C)   SpO2: 93% 95%     General: Awake, no distress.  CV:  Good peripheral perfusion.  Resp:  Normal effort.  Occasional cough Abd:  No distention.  Other:  No swelling in legs.  No calf tenderness   ED Results / Procedures / Treatments   Labs (all labs  ordered are listed, but only abnormal results are displayed) Labs Reviewed  CBC WITH DIFFERENTIAL/PLATELET  BASIC METABOLIC PANEL WITH GFR  TROPONIN T, HIGH SENSITIVITY     EKG  My interpretation of EKG:  Normal sinus rate of 70 without any significant ST elevation is a little bit in V2 that looks more like LVH, no T wave versions, normal intervals  RADIOLOGY I have reviewed the xray personally and interpreted no evidence of any pneumonia   PROCEDURES:  Critical Care performed: No  Procedures   MEDICATIONS ORDERED IN ED: Medications  ipratropium-albuterol  (DUONEB) 0.5-2.5 (3) MG/3ML nebulizer solution 3 mL (3 mLs Nebulization Given 06/25/24 0817)     IMPRESSION / MDM / ASSESSMENT AND PLAN / ED COURSE  I reviewed the triage vital signs and the nursing notes.   Patient's presentation is most consistent with acute presentation with potential threat to life or bodily function.   Patient comes in with continued cough.  Will get workup given he had an abnormal troponin last time he was here for this he did not get it or a repeat done we discussed repeating some blood work to ensure no worsening issues from that standpoint.  However this sounds more likely like a COPD exacerbation probably from viral illness but possibly bacterial.  Chest x-ray was ordered to make sure no evidence of pneumonia, effusions.  He has got no significant wheezing on my evaluation.  Will do an ambulatory saturation on him.  A chest x-ray was done that showed his chronic findings with no acute pathology.  I do not feel this represents PE as he is on Eliquis  and reports compliance with this.  He also recently had CTs imaging done that did not show any evidence of lung cancer.  BMP is reassuring cellular bated glucose with patient is on steroids.  CBC elevated white count but patient is on steroids.  Troponin was negative.   Patient's QTc was reassuring at 470.  I feel like patient stated that he does not  tolerate doxycycline  and though azithromycin  can increase QTc of that given it is short.  I feel like given his normal QTc that he should tolerate this okay.  He reports being on it previously and tolerating well.  Discussed with patient admission to the hospital for COPD exacerbation given he continues outpatient treatment but patient declined stated he preferred to try outpatient again.  He is oxygen levels have stayed above 90% and he is ambulatory without any significant work of breathing and no hypoxia.  Patient felt comfortable with discharge home with this change in medications but understands that if this is viral it can sometimes take time for that to resolve.  He expressed understanding and was preferring discharge home.     FINAL CLINICAL IMPRESSION(S) / ED DIAGNOSES   Final diagnoses:  COPD with acute exacerbation (HCC)  Acute cough     Rx / DC Orders   ED Discharge Orders          Ordered    predniSONE  (DELTASONE ) 20 MG tablet  Daily with breakfast        06/25/24 1018    amoxicillin -clavulanate (AUGMENTIN ) 875-125 MG tablet  2 times daily        06/25/24 1018    azithromycin  (ZITHROMAX  Z-PAK) 250 MG tablet        06/25/24 1018             Note:  This document was prepared using Dragon voice recognition software and may include unintentional dictation errors.   Ernest Ronal BRAVO, MD 06/25/24 1021    Ernest Ronal BRAVO, MD 06/25/24 1022  "

## 2024-06-25 NOTE — ED Notes (Signed)
 Pt c/o congestion and cough that started on Monday. Pt reports coming in on Monday and Tuesday for the same but has not had any relief. Pt reports hx of COPD and emphysema. Pt c/o right shoulder pain 4/10 that started about 3 weeks ago. Pt A&Ox4. Pt NAD at this time.

## 2024-06-25 NOTE — Discharge Instructions (Addendum)
 We are starting you on some stronger steroids for the next couple of days as well as some stronger antibiotics.  Please discontinue the medications you were taking previously and started this.  Continue using your inhaler.  If continued to feel shortness of breath and wheezing you should return to the ER for repeat evaluation.  We discussed admission to the hospital for monitoring given your cough and shortness of breath but you have opted to want to try outpatient which is reasonable given your oxygen levels are reassuring.

## 2024-06-25 NOTE — ED Notes (Signed)
 This tech and tech Kyen completed a walking stat on pt at this time. Pt was able to ambulate down a hall and was stating at 94% at the start and would fluctuate between 90-94% at the end of walk pt was back to 95%. Pt states only light headedness which is normal for me.

## 2024-06-25 NOTE — ED Triage Notes (Signed)
 Pt reports cough and congestin. Reports he has been seen 2 times in the last 4 days. Reports the cough is not going away.

## 2024-06-30 ENCOUNTER — Other Ambulatory Visit
Admission: RE | Admit: 2024-06-30 | Discharge: 2024-06-30 | Disposition: A | Discharge status: Home / Self Care | Payer: MEDICAID | Source: Ambulatory Visit | Attending: Internal Medicine | Admitting: Internal Medicine

## 2024-06-30 DIAGNOSIS — Z8709 Personal history of other diseases of the respiratory system: Secondary | ICD-10-CM | POA: Diagnosis present

## 2024-06-30 DIAGNOSIS — Z03818 Encounter for observation for suspected exposure to other biological agents ruled out: Secondary | ICD-10-CM | POA: Insufficient documentation

## 2024-06-30 DIAGNOSIS — R0602 Shortness of breath: Secondary | ICD-10-CM | POA: Diagnosis present

## 2024-06-30 DIAGNOSIS — R051 Acute cough: Secondary | ICD-10-CM | POA: Diagnosis present

## 2024-06-30 DIAGNOSIS — I5022 Chronic systolic (congestive) heart failure: Secondary | ICD-10-CM | POA: Diagnosis present

## 2024-06-30 LAB — D-DIMER, QUANTITATIVE: D-Dimer, Quant: <0.27 ug{FEU}/mL (ref 0.00–0.50)

## 2024-11-26 ENCOUNTER — Ambulatory Visit: Payer: MEDICAID | Admitting: Family
